# Patient Record
Sex: Female | Born: 1980 | Hispanic: No | Marital: Married | Smoking: Never smoker
Health system: Southern US, Community
[De-identification: ages and names within clinical notes are randomized; demographics above are authoritative.]

## PROBLEM LIST (undated history)

## (undated) DIAGNOSIS — J45909 Unspecified asthma, uncomplicated: Secondary | ICD-10-CM

## (undated) DIAGNOSIS — E119 Type 2 diabetes mellitus without complications: Secondary | ICD-10-CM

## (undated) DIAGNOSIS — I10 Essential (primary) hypertension: Secondary | ICD-10-CM

## (undated) DIAGNOSIS — I2699 Other pulmonary embolism without acute cor pulmonale: Secondary | ICD-10-CM

## (undated) DIAGNOSIS — K219 Gastro-esophageal reflux disease without esophagitis: Secondary | ICD-10-CM

## (undated) DIAGNOSIS — D649 Anemia, unspecified: Secondary | ICD-10-CM

## (undated) DIAGNOSIS — I509 Heart failure, unspecified: Secondary | ICD-10-CM

## (undated) DIAGNOSIS — E785 Hyperlipidemia, unspecified: Secondary | ICD-10-CM

## (undated) DIAGNOSIS — I639 Cerebral infarction, unspecified: Secondary | ICD-10-CM

## (undated) DIAGNOSIS — N189 Chronic kidney disease, unspecified: Secondary | ICD-10-CM

## (undated) HISTORY — DX: Chronic kidney disease, unspecified: N18.9

## (undated) HISTORY — PX: MOUTH SURGERY: SHX715

---

## 2000-02-06 ENCOUNTER — Other Ambulatory Visit: Admission: RE | Admit: 2000-02-06 | Discharge: 2000-02-06 | Payer: Self-pay | Admitting: Family Medicine

## 2005-06-24 ENCOUNTER — Other Ambulatory Visit: Admission: RE | Admit: 2005-06-24 | Discharge: 2005-06-24 | Payer: Self-pay | Admitting: Obstetrics and Gynecology

## 2007-09-21 ENCOUNTER — Encounter: Admission: RE | Admit: 2007-09-21 | Discharge: 2007-12-20 | Payer: Self-pay | Admitting: Family Medicine

## 2009-08-14 ENCOUNTER — Emergency Department (HOSPITAL_COMMUNITY): Admission: EM | Admit: 2009-08-14 | Discharge: 2009-08-14 | Payer: Self-pay | Admitting: Emergency Medicine

## 2011-01-01 LAB — POCT URINALYSIS DIP (DEVICE)
Nitrite: NEGATIVE
pH: 5.5 (ref 5.0–8.0)

## 2014-02-09 ENCOUNTER — Telehealth: Payer: Self-pay | Admitting: *Deleted

## 2014-02-09 NOTE — Telephone Encounter (Signed)
rx denied, patient has not been seen in over a year

## 2016-06-05 ENCOUNTER — Encounter (HOSPITAL_COMMUNITY): Payer: Self-pay | Admitting: Nurse Practitioner

## 2016-06-05 ENCOUNTER — Inpatient Hospital Stay (HOSPITAL_COMMUNITY)
Admission: EM | Admit: 2016-06-05 | Discharge: 2016-06-06 | DRG: 639 | Disposition: A | Discharge status: Home / Self Care | Payer: Managed Care, Other (non HMO) | Attending: Internal Medicine | Admitting: Internal Medicine

## 2016-06-05 ENCOUNTER — Observation Stay (HOSPITAL_COMMUNITY): Payer: Managed Care, Other (non HMO)

## 2016-06-05 DIAGNOSIS — E1165 Type 2 diabetes mellitus with hyperglycemia: Secondary | ICD-10-CM

## 2016-06-05 DIAGNOSIS — E876 Hypokalemia: Secondary | ICD-10-CM | POA: Diagnosis present

## 2016-06-05 DIAGNOSIS — Z79899 Other long term (current) drug therapy: Secondary | ICD-10-CM | POA: Diagnosis not present

## 2016-06-05 DIAGNOSIS — Z833 Family history of diabetes mellitus: Secondary | ICD-10-CM

## 2016-06-05 DIAGNOSIS — E1110 Type 2 diabetes mellitus with ketoacidosis without coma: Secondary | ICD-10-CM | POA: Diagnosis present

## 2016-06-05 DIAGNOSIS — Z794 Long term (current) use of insulin: Secondary | ICD-10-CM | POA: Diagnosis not present

## 2016-06-05 DIAGNOSIS — E1310 Other specified diabetes mellitus with ketoacidosis without coma: Secondary | ICD-10-CM | POA: Diagnosis not present

## 2016-06-05 DIAGNOSIS — Z888 Allergy status to other drugs, medicaments and biological substances status: Secondary | ICD-10-CM

## 2016-06-05 DIAGNOSIS — Z7982 Long term (current) use of aspirin: Secondary | ICD-10-CM

## 2016-06-05 DIAGNOSIS — K219 Gastro-esophageal reflux disease without esophagitis: Secondary | ICD-10-CM | POA: Diagnosis present

## 2016-06-05 DIAGNOSIS — R059 Cough, unspecified: Secondary | ICD-10-CM

## 2016-06-05 DIAGNOSIS — E785 Hyperlipidemia, unspecified: Secondary | ICD-10-CM | POA: Diagnosis present

## 2016-06-05 DIAGNOSIS — E1010 Type 1 diabetes mellitus with ketoacidosis without coma: Principal | ICD-10-CM | POA: Diagnosis present

## 2016-06-05 DIAGNOSIS — Z7984 Long term (current) use of oral hypoglycemic drugs: Secondary | ICD-10-CM | POA: Diagnosis not present

## 2016-06-05 DIAGNOSIS — R05 Cough: Secondary | ICD-10-CM

## 2016-06-05 DIAGNOSIS — R112 Nausea with vomiting, unspecified: Secondary | ICD-10-CM | POA: Diagnosis present

## 2016-06-05 HISTORY — DX: Type 2 diabetes mellitus without complications: E11.9

## 2016-06-05 HISTORY — DX: Hyperlipidemia, unspecified: E78.5

## 2016-06-05 HISTORY — DX: Gastro-esophageal reflux disease without esophagitis: K21.9

## 2016-06-05 LAB — BASIC METABOLIC PANEL
ANION GAP: 9 (ref 5–15)
BUN: 12 mg/dL (ref 6–20)
CALCIUM: 8.7 mg/dL — AB (ref 8.9–10.3)
CO2: 22 mmol/L (ref 22–32)
CREATININE: 0.61 mg/dL (ref 0.44–1.00)
Chloride: 104 mmol/L (ref 101–111)
Glucose, Bld: 271 mg/dL — ABNORMAL HIGH (ref 65–99)
Potassium: 4.1 mmol/L (ref 3.5–5.1)
Sodium: 135 mmol/L (ref 135–145)

## 2016-06-05 LAB — BLOOD GAS, VENOUS
ACID-BASE DEFICIT: 1.7 mmol/L (ref 0.0–2.0)
Bicarbonate: 23.5 mmol/L (ref 20.0–28.0)
O2 SAT: 68.4 %
PCO2 VEN: 43.7 mmHg — AB (ref 44.0–60.0)
PO2 VEN: 41.1 mmHg (ref 32.0–45.0)
Patient temperature: 98.6
pH, Ven: 7.35 (ref 7.250–7.430)

## 2016-06-05 LAB — CBC WITH DIFFERENTIAL/PLATELET
BASOS ABS: 0 10*3/uL (ref 0.0–0.1)
Basophils Relative: 0 %
Eosinophils Absolute: 0.1 10*3/uL (ref 0.0–0.7)
Eosinophils Relative: 1 %
HEMATOCRIT: 40.7 % (ref 36.0–46.0)
Hemoglobin: 13.5 g/dL (ref 12.0–15.0)
LYMPHS PCT: 18 %
Lymphs Abs: 2.3 10*3/uL (ref 0.7–4.0)
MCH: 27.7 pg (ref 26.0–34.0)
MCHC: 33.2 g/dL (ref 30.0–36.0)
MCV: 83.4 fL (ref 78.0–100.0)
MONO ABS: 0.5 10*3/uL (ref 0.1–1.0)
Monocytes Relative: 4 %
NEUTROS ABS: 10.2 10*3/uL — AB (ref 1.7–7.7)
Neutrophils Relative %: 77 %
Platelets: 331 10*3/uL (ref 150–400)
RBC: 4.88 MIL/uL (ref 3.87–5.11)
RDW: 16.5 % — ABNORMAL HIGH (ref 11.5–15.5)
WBC: 13.1 10*3/uL — AB (ref 4.0–10.5)

## 2016-06-05 LAB — COMPREHENSIVE METABOLIC PANEL
ALBUMIN: 4.1 g/dL (ref 3.5–5.0)
ALT: 16 U/L (ref 14–54)
AST: 14 U/L — AB (ref 15–41)
Alkaline Phosphatase: 137 U/L — ABNORMAL HIGH (ref 38–126)
Anion gap: 16 — ABNORMAL HIGH (ref 5–15)
BUN: 14 mg/dL (ref 6–20)
CHLORIDE: 92 mmol/L — AB (ref 101–111)
CO2: 22 mmol/L (ref 22–32)
Calcium: 9.5 mg/dL (ref 8.9–10.3)
Creatinine, Ser: 0.89 mg/dL (ref 0.44–1.00)
GFR calc Af Amer: >60 mL/min (ref >60)
Glucose, Bld: 628 mg/dL (ref 65–99)
POTASSIUM: 4.3 mmol/L (ref 3.5–5.1)
SODIUM: 130 mmol/L — AB (ref 135–145)
Total Bilirubin: 1.1 mg/dL (ref 0.3–1.2)
Total Protein: 9.3 g/dL — ABNORMAL HIGH (ref 6.5–8.1)

## 2016-06-05 LAB — URINE MICROSCOPIC-ADD ON: RBC / HPF: NONE SEEN RBC/hpf (ref 0–5)

## 2016-06-05 LAB — GLUCOSE, CAPILLARY
GLUCOSE-CAPILLARY: 245 mg/dL — AB (ref 65–99)
GLUCOSE-CAPILLARY: 192 mg/dL — AB (ref 65–99)
GLUCOSE-CAPILLARY: 190 mg/dL — AB (ref 65–99)
Glucose-Capillary: 251 mg/dL — ABNORMAL HIGH (ref 65–99)
Glucose-Capillary: 226 mg/dL — ABNORMAL HIGH (ref 65–99)
Glucose-Capillary: 249 mg/dL — ABNORMAL HIGH (ref 65–99)

## 2016-06-05 LAB — URINALYSIS, ROUTINE W REFLEX MICROSCOPIC
Bilirubin Urine: NEGATIVE
Hgb urine dipstick: NEGATIVE
KETONES UR: 40 mg/dL — AB
LEUKOCYTES UA: NEGATIVE
NITRITE: NEGATIVE
PROTEIN: NEGATIVE mg/dL
Specific Gravity, Urine: 1.041 — ABNORMAL HIGH (ref 1.005–1.030)
pH: 5.5 (ref 5.0–8.0)

## 2016-06-05 LAB — CBG MONITORING, ED
GLUCOSE-CAPILLARY: 462 mg/dL — AB (ref 65–99)
GLUCOSE-CAPILLARY: 349 mg/dL — AB (ref 65–99)
GLUCOSE-CAPILLARY: 332 mg/dL — AB (ref 65–99)
Glucose-Capillary: 565 mg/dL (ref 65–99)

## 2016-06-05 LAB — LIPASE, BLOOD: LIPASE: 23 U/L (ref 11–51)

## 2016-06-05 LAB — MRSA PCR SCREENING: MRSA by PCR: NEGATIVE

## 2016-06-05 LAB — POC URINE PREG, ED: PREG TEST UR: NEGATIVE

## 2016-06-05 LAB — I-STAT CHEM 8, ED
BUN: 11 mg/dL (ref 6–20)
CALCIUM ION: 1.14 mmol/L — AB (ref 1.15–1.40)
Chloride: 99 mmol/L — ABNORMAL LOW (ref 101–111)
Creatinine, Ser: 0.6 mg/dL (ref 0.44–1.00)
Glucose, Bld: 385 mg/dL — ABNORMAL HIGH (ref 65–99)
HCT: 40 % (ref 36.0–46.0)
Hemoglobin: 13.6 g/dL (ref 12.0–15.0)
Potassium: 4.1 mmol/L (ref 3.5–5.1)
SODIUM: 140 mmol/L (ref 135–145)
TCO2: 25 mmol/L (ref 0–100)

## 2016-06-05 IMAGING — CR DG CHEST 2V
2 series · 2 of 2 positions shown · non-contrast
Comparison: None.

CLINICAL DATA: Cough

EXAM:
CHEST  2 VIEW

[w chest pa]
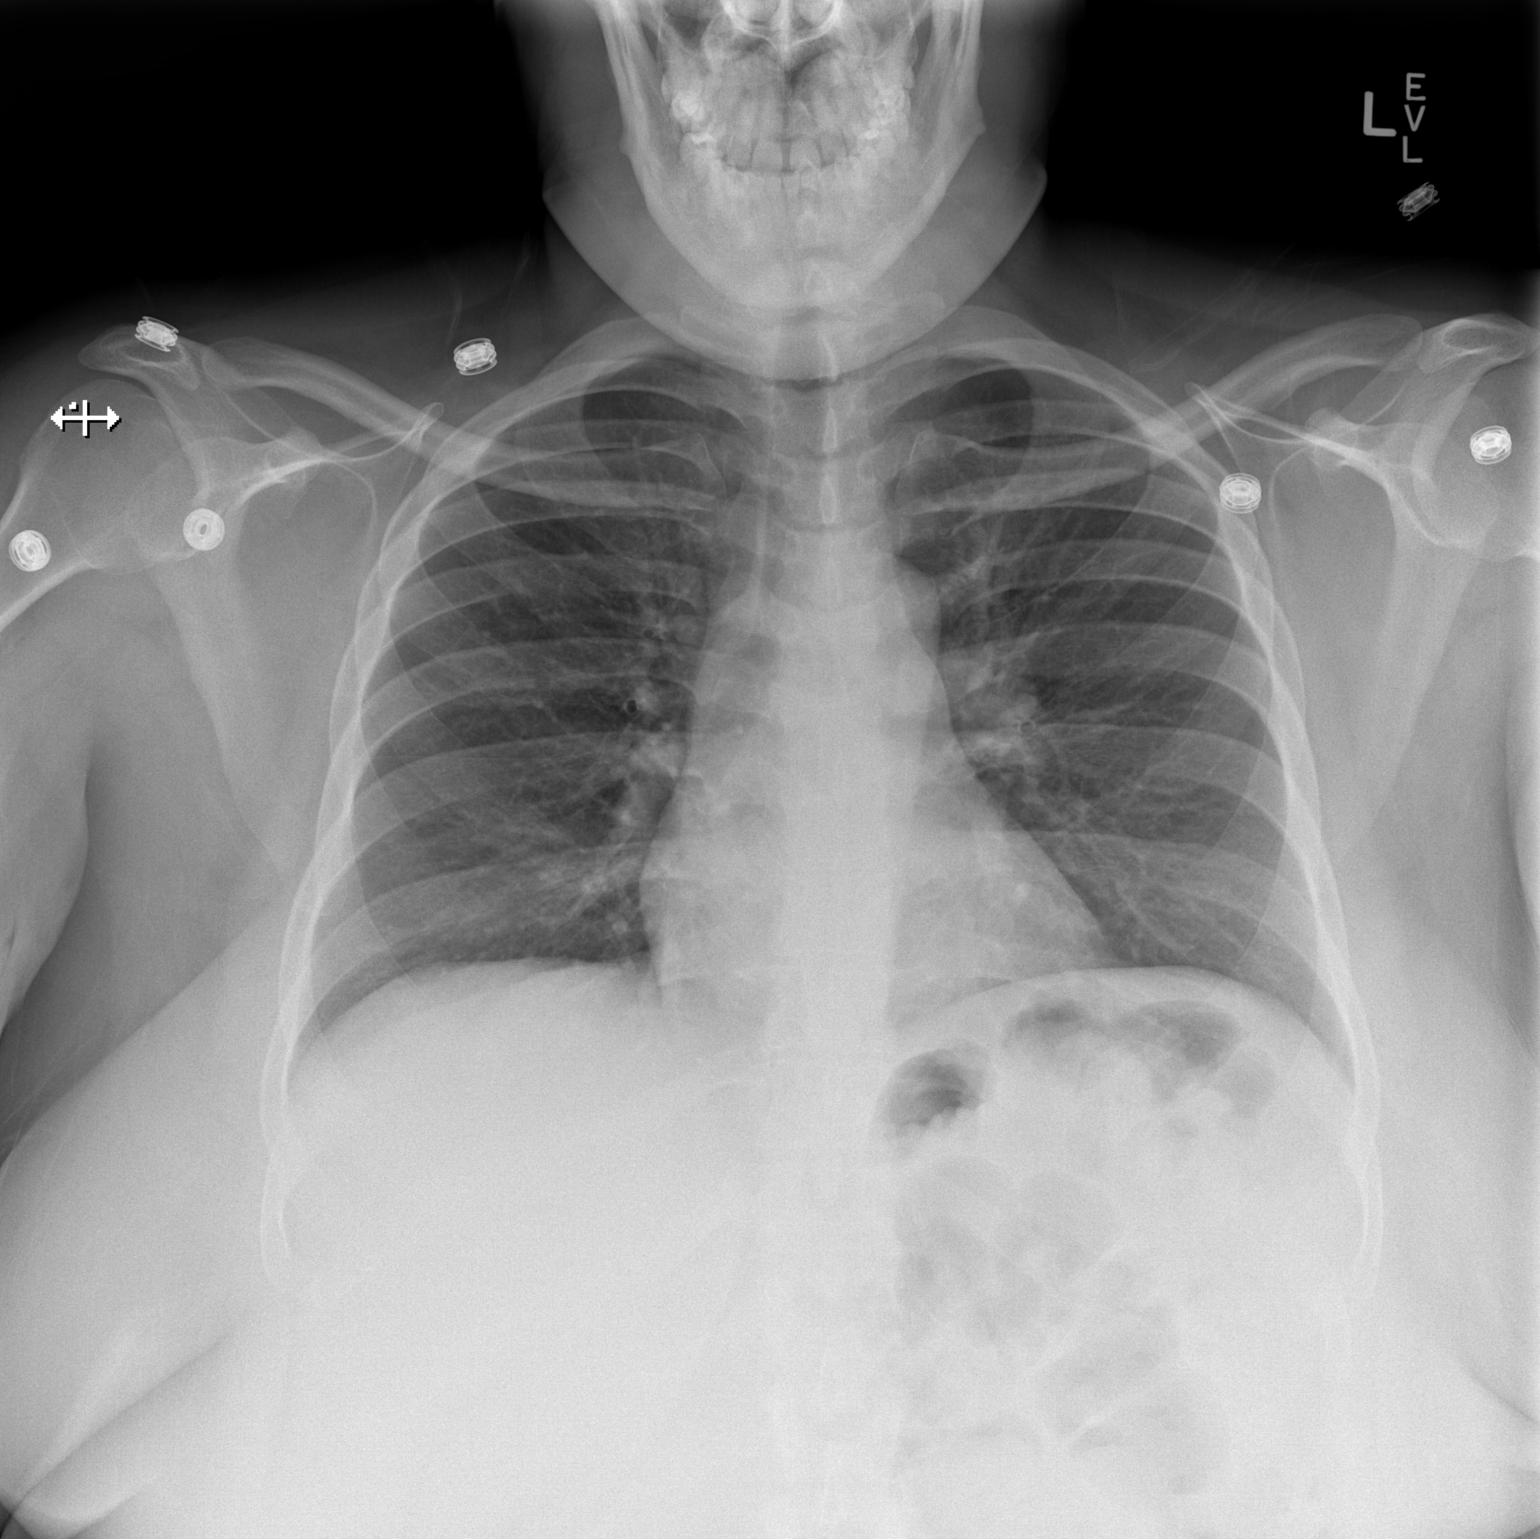

[w chest lat]
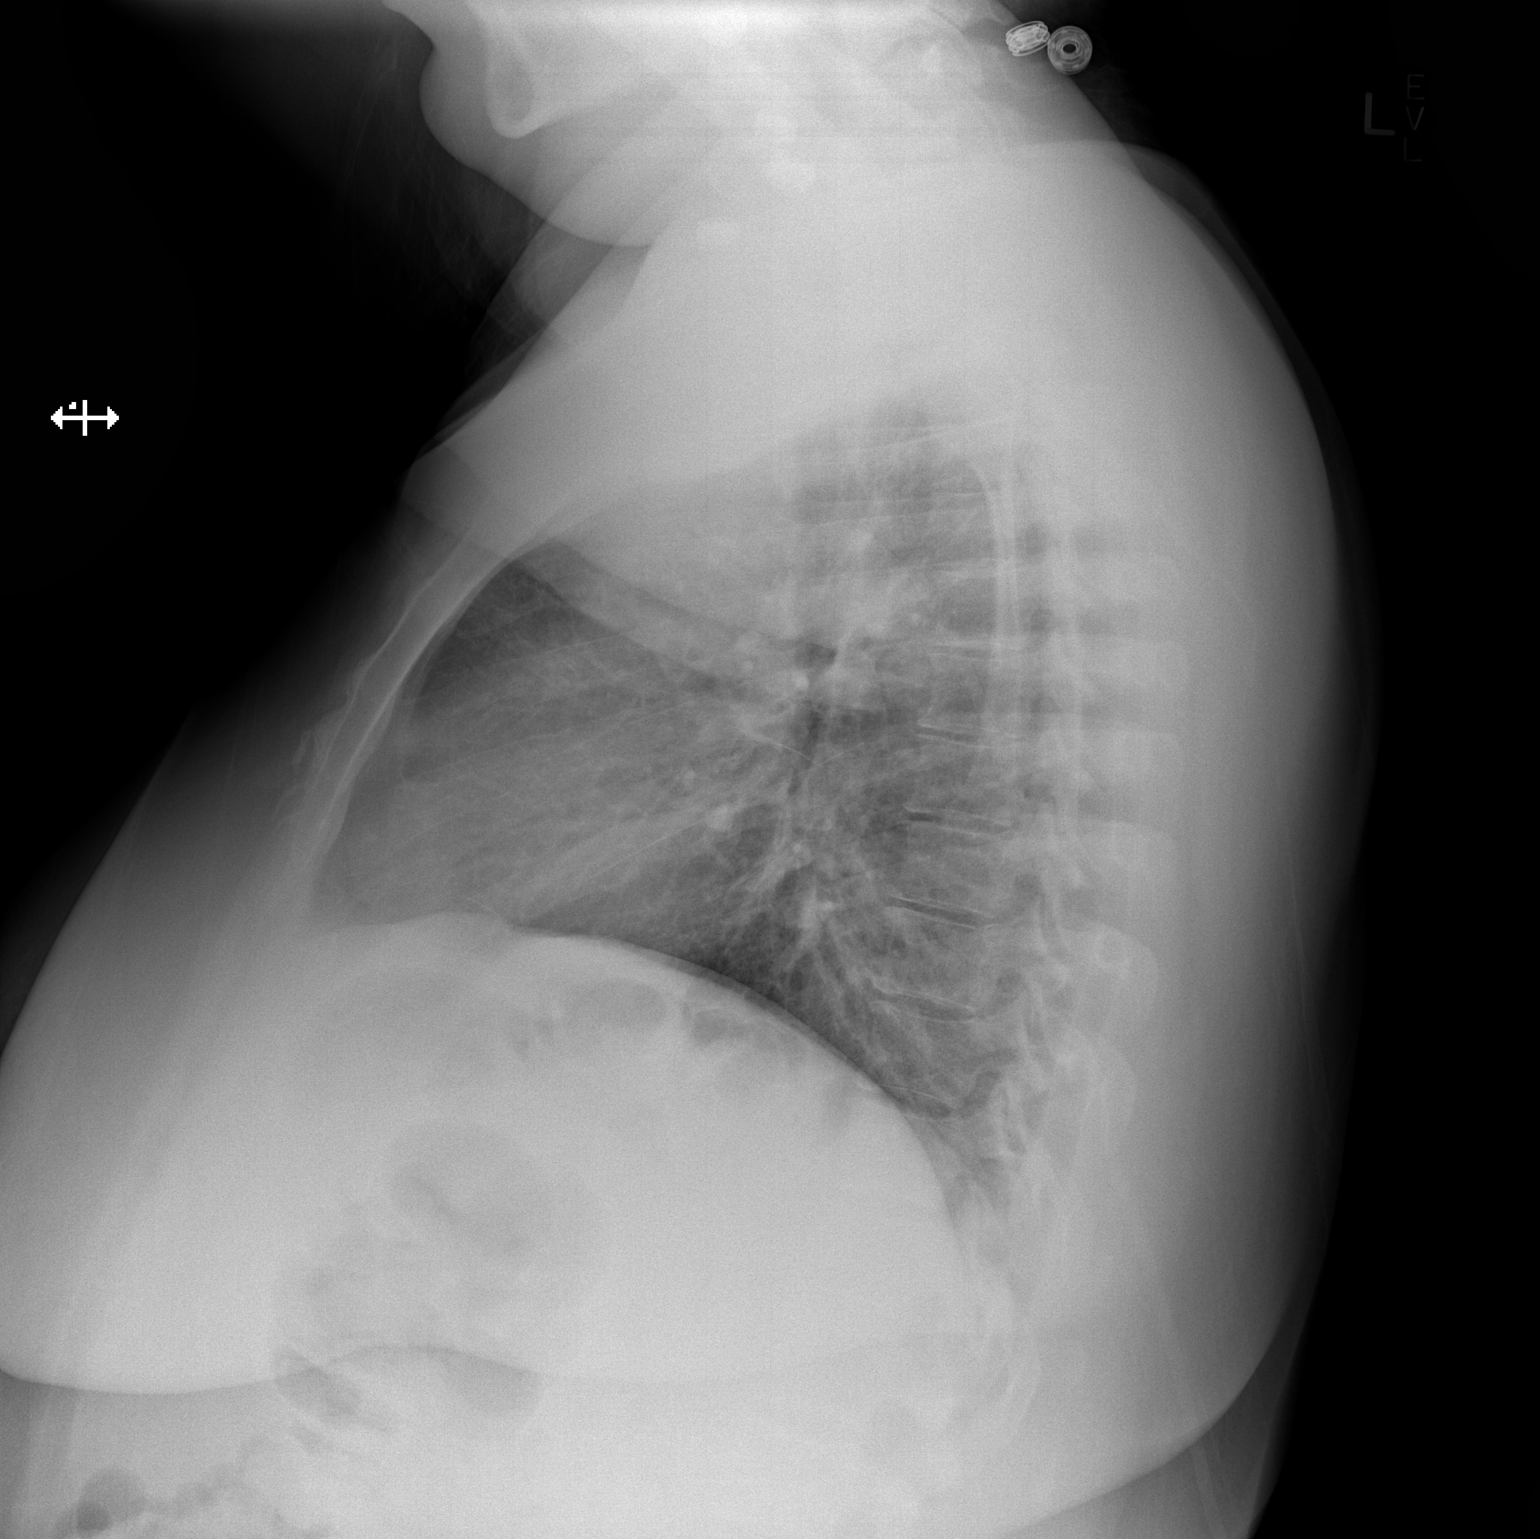

[2 of 2 positions shown; findings below may reference images not displayed]

FINDINGS: The heart size and mediastinal contours are within normal limits.
Both lungs are clear. The visualized skeletal structures are
unremarkable.
IMPRESSION: No active cardiopulmonary disease.

## 2016-06-05 MED ORDER — INSULIN ASPART 100 UNIT/ML ~~LOC~~ SOLN
10.0000 [IU] | Freq: Once | SUBCUTANEOUS | Status: AC
  Administered 2016-06-05: 10 [IU] via INTRAVENOUS
  Filled 2016-06-05: qty 1

## 2016-06-05 MED ORDER — DEXTROSE-NACL 5-0.45 % IV SOLN: INTRAVENOUS | Status: DC

## 2016-06-05 MED ORDER — POTASSIUM CHLORIDE 10 MEQ/100ML IV SOLN
10.0000 meq | INTRAVENOUS | Status: AC
  Administered 2016-06-05 (×3): 10 meq via INTRAVENOUS
  Filled 2016-06-05 (×2): qty 100

## 2016-06-05 MED ORDER — SODIUM CHLORIDE 0.9% FLUSH
3.0000 mL | Freq: Two times a day (BID) | INTRAVENOUS | Status: DC
  Administered 2016-06-05 – 2016-06-06 (×2): 3 mL via INTRAVENOUS

## 2016-06-05 MED ORDER — SODIUM CHLORIDE 0.9 % IV BOLUS (SEPSIS)
1000.0000 mL | Freq: Once | INTRAVENOUS | Status: AC
  Administered 2016-06-05: 1000 mL via INTRAVENOUS

## 2016-06-05 MED ORDER — SODIUM CHLORIDE 0.9 % IV SOLN: INTRAVENOUS | Status: DC

## 2016-06-05 MED ORDER — SIMVASTATIN 40 MG PO TABS
40.0000 mg | ORAL_TABLET | Freq: Every day | ORAL | Status: DC
  Administered 2016-06-05: 40 mg via ORAL
  Filled 2016-06-05: qty 1

## 2016-06-05 MED ORDER — ONDANSETRON HCL 4 MG/2ML IJ SOLN
4.0000 mg | Freq: Once | INTRAMUSCULAR | Status: AC
  Administered 2016-06-05: 4 mg via INTRAVENOUS
  Filled 2016-06-05: qty 2

## 2016-06-05 MED ORDER — ENOXAPARIN SODIUM 40 MG/0.4ML ~~LOC~~ SOLN
40.0000 mg | SUBCUTANEOUS | Status: DC
  Administered 2016-06-05: 40 mg via SUBCUTANEOUS
  Filled 2016-06-05: qty 0.4

## 2016-06-05 MED ORDER — SODIUM CHLORIDE 0.9 % IV SOLN
INTRAVENOUS | Status: DC
  Administered 2016-06-05: 2.7 [IU]/h via INTRAVENOUS
  Filled 2016-06-05: qty 2.5

## 2016-06-05 MED ORDER — SODIUM CHLORIDE 0.9 % IV SOLN
INTRAVENOUS | Status: AC
  Administered 2016-06-05: 17:00:00 via INTRAVENOUS

## 2016-06-05 MED ORDER — DEXTROSE-NACL 5-0.45 % IV SOLN
INTRAVENOUS | Status: DC
  Administered 2016-06-05: 17:00:00 via INTRAVENOUS

## 2016-06-05 NOTE — ED Notes (Signed)
Report given to ICU RN

## 2016-06-05 NOTE — H&P (Signed)
History and Physical    Sherri Dixon [id number] DOB: May 27, 1981 DOA: 06/05/2016  PCP: No primary care provider on file.  Outpatient Specialists: Dr. Forde Dandy, endocrinology (about to establish care,seen by Dr. Chalmers Cater in the past) Patient coming from: home  Chief Complaint: Elevated sugars  HPI: Sherri Dixon is a 35 y.o. female with medical history significant of type 2 diabetes mellitus, hyperlipidemia, reflux, presents to the emergency room with chief complaint of abdominal pain, nausea vomiting as well as elevated CBGs. Patient has a history of insulin-dependent diabetes mellitus, she is currently in the process of changing endocrinologists. She used to be on NPH isophane and regular insulin, however she developed mouth sores which were believed to be related to her insulin. She was instructed to stop her insulin about couple weeks ago, and had a period for about 6-7 days where she didn't get any insulin. About 3 days ago, she was given Toujeo as a sample to take. Ever since she came off of her insulin 2 weeks ago, patient has been experiencing abdominal pain, nausea or vomiting. She denies any fevers however endorses chills. She has no dysuria. Denies any chest pain or palpitations. She is complaining of "chest congestion" and a dry cough.  ED Course: In the ED, she was found to have elevated CBG in the 600s, she has a gap of 16, however bicarbonate was normal at 22. She was started on insulin infusion and [facility] was asked to admit.  Review of Systems: As per HPI otherwise 10 point review of systems negative.   Past Medical History:  Diagnosis Date  . Diabetes mellitus without complication ([place])   . Dyslipidemia   . GERD (gastroesophageal reflux disease)     History reviewed. No pertinent surgical history.   reports that she has never smoked. She has never used smokeless tobacco. She reports that she does not drink alcohol or use drugs.  Allergies  Allergen Reactions  . Insulin Nph  Isophane & Regular Rash    Pt has this reaction to humalog & lantus when taken together     Family History  Problem Relation Age of Onset  . High blood pressure Mother   . Diabetes Mother   . Arthritis Mother   . Hyperlipidemia Mother   . High blood pressure Father   . Hyperlipidemia Father   . High blood pressure Sister   . Diabetes Brother   . Hyperlipidemia Brother   . High blood pressure Brother     Prior to Admission medications   Medication Sig Start Date End Date Taking? Authorizing Provider  aspirin 325 MG EC tablet Take 325 mg by mouth daily.   Yes Historical Provider, MD  Insulin Glargine (TOUJEO SOLOSTAR) 300 UNIT/ML SOPN Inject 50 Units into the skin daily.   Yes Historical Provider, MD  metFORMIN (GLUCOPHAGE) 500 MG tablet Take 500 mg by mouth 2 (two) times daily.   Yes Historical Provider, MD  Multiple Vitamins-Minerals (CENTRUM ADULTS PO) Take 1 tablet by mouth daily.   Yes Historical Provider, MD  naproxen sodium (ANAPROX) 220 MG tablet Take 400 mg by mouth every 12 (twelve) hours as needed (pain).   Yes Historical Provider, MD  simvastatin (ZOCOR) 40 MG tablet Take 40 mg by mouth at bedtime. 03/28/16  Yes Historical Provider, MD    Physical Exam: Vitals:   06/05/16 0902 06/05/16 0915 06/05/16 1154 06/05/16 1357  BP: 140/82  118/73 126/81  Pulse: 101  89 87  Resp: 17  18 18   Temp: 98.8  F (37.1 C)  98.7 F (37.1 C) 98.8 F (37.1 C)  TempSrc: Oral  Oral Oral  SpO2: 100%  100% 100%  Weight:  (!) 140.6 kg (310 lb)    Height:  5\' 6"  (1.676 m)        Constitutional: NAD, calm, comfortable Vitals:   06/05/16 0902 06/05/16 0915 06/05/16 1154 06/05/16 1357  BP: 140/82  118/73 126/81  Pulse: 101  89 87  Resp: 17  18 18   Temp: 98.8 F (37.1 C)  98.7 F (37.1 C) 98.8 F (37.1 C)  TempSrc: Oral  Oral Oral  SpO2: 100%  100% 100%  Weight:  (!) 140.6 kg (310 lb)    Height:  5\' 6"  (1.676 m)     Eyes: PERRL, lids and conjunctivae normal ENMT: Mucous  membranes are moist.  Respiratory: clear to auscultation bilaterally, no wheezing, no crackles. Normal respiratory effort. No accessory muscle use.  Cardiovascular: Regular rate and rhythm, no murmurs / rubs / gallops. No extremity edema. 2+ pedal pulses.  Abdomen: no tenderness, no masses palpated. Bowel sounds positive.  Musculoskeletal: no clubbing / cyanosis. Normal muscle tone.  Skin: Eczematous/discolor related lesions bilateral palms, bilateral lower extremities  No induration Neurologic: CN 2-12 grossly intact. Strength 5/5 in all 4.  Psychiatric: Normal judgment and insight. Alert and oriented x 3. Normal mood.   Labs on Admission: I have personally reviewed following labs and imaging studies  CBC:  Recent Labs Lab 06/05/16 1025 06/05/16 1417  WBC 13.1*  --   NEUTROABS 10.2*  --   HGB 13.5 13.6  HCT 40.7 40.0  MCV 83.4  --   PLT 331  --    Basic Metabolic Panel:  Recent Labs Lab 06/05/16 1025 06/05/16 1417  NA 130* 140  K 4.3 4.1  CL 92* 99*  CO2 22  --   GLUCOSE 628* 385*  BUN 14 11  CREATININE 0.89 0.60  CALCIUM 9.5  --    GFR: Estimated Creatinine Clearance: 139.5 mL/min (by C-G formula based on SCr of 0.8 mg/dL). Liver Function Tests:  Recent Labs Lab 06/05/16 1025  AST 14*  ALT 16  ALKPHOS 137*  BILITOT 1.1  PROT 9.3*  ALBUMIN 4.1    Recent Labs Lab 06/05/16 1025  LIPASE 23   No results for input(s): AMMONIA in the last 168 hours. Coagulation Profile: No results for input(s): INR, PROTIME in the last 168 hours. Cardiac Enzymes: No results for input(s): CKTOTAL, CKMB, CKMBINDEX, TROPONINI in the last 168 hours. BNP (last 3 results) No results for input(s): PROBNP in the last 8760 hours. HbA1C: No results for input(s): HGBA1C in the last 72 hours. CBG:  Recent Labs Lab 06/05/16 0951 06/05/16 1222 06/05/16 1418 06/05/16 1555  GLUCAP 565* 462* 349* 332*   Lipid Profile: No results for input(s): CHOL, HDL, LDLCALC, TRIG, CHOLHDL,  LDLDIRECT in the last 72 hours. Thyroid Function Tests: No results for input(s): TSH, T4TOTAL, FREET4, T3FREE, THYROIDAB in the last 72 hours. Anemia Panel: No results for input(s): VITAMINB12, FOLATE, FERRITIN, TIBC, IRON, RETICCTPCT in the last 72 hours. Urine analysis:    Component Value Date/Time   COLORURINE YELLOW 06/05/2016 0941   APPEARANCEUR CLEAR 06/05/2016 0941   LABSPEC 1.041 (H) 06/05/2016 0941   PHURINE 5.5 06/05/2016 0941   GLUCOSEU >1000 (A) 06/05/2016 0941   HGBUR NEGATIVE 06/05/2016 0941   BILIRUBINUR NEGATIVE 06/05/2016 0941   KETONESUR 40 (A) 06/05/2016 0941   PROTEINUR NEGATIVE 06/05/2016 0941   UROBILINOGEN 1.0 08/14/2009 0850  NITRITE NEGATIVE 06/05/2016 0941   LEUKOCYTESUR NEGATIVE 06/05/2016 0941   Sepsis Labs: @LABRCNTIP (procalcitonin:4,lacticidven:4) )No results found for this or any previous visit (from the past 240 hour(s)).   Radiological Exams on Admission: No results found.   Assessment/Plan Active Problems:   GERD (gastroesophageal reflux disease)   DKA (diabetic ketoacidoses) (HCC)   Elevated CBGs / in the setting of insulin-dependent diabetes mellitus - Patient's bicarbonate is normal, pH seems to be normal however she has an anion gap. Doesn't appear to be classic regular DKA but more of a hyperglycemia picture, probably if she wouldn't have been on any insulins at all she would be more acidotic. I will treat her as DKA though to be on the safe side, and admit to step down, place on insulin drip, keep NPO for now - Obtain a chest x-ray given her reported upper respiratory symptoms   Hyperlipidemia - Continue home medications   DVT prophylaxis: Loveox  Code Status: Full  Family Communication: significant otherbedside Disposition Plan: Admit to step down  Consults called: None   Admission status: Inpatient     Marzetta Board, MD [facility] Pager [phone]  If 7PM-7AM, please contact  night-coverage [email or web address] Password [email or web address]  06/05/2016, 4:14 PM

## 2016-06-05 NOTE — ED Provider Notes (Signed)
[facility] DEPT Provider Note   CSN: [id number] Arrival date & time: 06/05/16  0846     History   Chief Complaint Chief Complaint  Patient presents with  . Emesis    HPI Sherri Dixon is a 35 y.o. female.  HPI Patient presents with concern of abdominal pain, nausea, vomiting. Illness has been present for about 2 weeks, worse over the past few days per Symptoms are more pronounced in the morning, improve over the day. Pain is upper abdominal, nonradiating, sore, crampy. There is continuous associated anorexia or No diarrhea. No chest pain, no dyspnea. Patient has a notable history of insulin-dependent diabetes, recently had changes in medication, including new oral antihyperglycemic which seemed to increase her symptoms.   Past Medical History:  Diagnosis Date  . Diabetes mellitus without complication ([place])   . Dyslipidemia   . GERD (gastroesophageal reflux disease)     Patient Active Problem List   Diagnosis Date Noted  . GERD (gastroesophageal reflux disease)     History reviewed. No pertinent surgical history.  OB History    No data available       Home Medications    Prior to Admission medications   Medication Sig Start Date End Date Taking? Authorizing Provider  aspirin 325 MG EC tablet Take 325 mg by mouth daily.   Yes Historical Provider, MD  Insulin Glargine (TOUJEO SOLOSTAR) 300 UNIT/ML SOPN Inject 50 Units into the skin daily.   Yes Historical Provider, MD  metFORMIN (GLUCOPHAGE) 500 MG tablet Take 500 mg by mouth 2 (two) times daily.   Yes Historical Provider, MD  Multiple Vitamins-Minerals (CENTRUM ADULTS PO) Take 1 tablet by mouth daily.   Yes Historical Provider, MD  naproxen sodium (ANAPROX) 220 MG tablet Take 400 mg by mouth every 12 (twelve) hours as needed (pain).   Yes Historical Provider, MD  simvastatin (ZOCOR) 40 MG tablet Take 40 mg by mouth at bedtime. 03/28/16  Yes Historical Provider, MD    Family History Family History    Problem Relation Age of Onset  . Family history unknown: Yes    Social History Social History  Substance Use Topics  . Smoking status: Never Smoker  . Smokeless tobacco: Never Used  . Alcohol use No     Allergies   Insulin nph isophane & regular   Review of Systems Review of Systems  Constitutional:       Per HPI, otherwise negative  HENT:       Per HPI, otherwise negative  Respiratory:       Per HPI, otherwise negative  Cardiovascular:       Per HPI, otherwise negative  Gastrointestinal: Positive for abdominal pain, nausea and vomiting.  Endocrine:       Negative aside from HPI  Genitourinary:       Neg aside from HPI   Musculoskeletal:       Per HPI, otherwise negative  Skin: Negative.   Neurological: Negative for syncope.     Physical Exam Updated Vital Signs BP 140/82 (BP Location: Right Arm)   Pulse 101   Temp 98.8 F (37.1 C) (Oral)   Resp 17   Ht 5\' 6"  (1.676 m)   Wt (!) 310 lb (140.6 kg)   LMP 05/18/2016   SpO2 100%   BMI 50.04 kg/m   Physical Exam  Constitutional: She is oriented to person, place, and time. She appears well-developed and well-nourished. No distress.  Obese young female sitting upright speaking clearly  HENT:  Head:  Normocephalic and atraumatic.  Eyes: Conjunctivae and EOM are normal.  Cardiovascular: Normal rate and regular rhythm.   Pulmonary/Chest: Effort normal and breath sounds normal. No stridor. No respiratory distress.  Abdominal: She exhibits no distension.  No appreciable abdominal pain anywhere  Musculoskeletal: She exhibits no edema.  Neurological: She is alert and oriented to person, place, and time. No cranial nerve deficit.  Skin: Skin is warm and dry.  Psychiatric: She has a normal mood and affect.  Nursing note and vitals reviewed.    ED Treatments / Results  Labs (all labs ordered are listed, but only abnormal results are displayed) Labs Reviewed  URINALYSIS, ROUTINE W REFLEX MICROSCOPIC (NOT AT  [facility]) - Abnormal; Notable for the following:       Result Value   Specific Gravity, Urine 1.041 (*)    Glucose, UA >1000 (*)    Ketones, ur 40 (*)    All other components within normal limits  COMPREHENSIVE METABOLIC PANEL - Abnormal; Notable for the following:    Sodium 130 (*)    Chloride 92 (*)    Glucose, Bld 628 (*)    Total Protein 9.3 (*)    AST 14 (*)    Alkaline Phosphatase 137 (*)    Anion gap 16 (*)    All other components within normal limits  CBC WITH DIFFERENTIAL/PLATELET - Abnormal; Notable for the following:    WBC 13.1 (*)    RDW 16.5 (*)    Neutro Abs 10.2 (*)    All other components within normal limits  BLOOD GAS, VENOUS - Abnormal; Notable for the following:    pCO2, Ven 43.7 (*)    All other components within normal limits  URINE MICROSCOPIC-ADD ON - Abnormal; Notable for the following:    Squamous Epithelial / LPF 0-5 (*)    Bacteria, UA RARE (*)    All other components within normal limits  CBG MONITORING, ED - Abnormal; Notable for the following:    Glucose-Capillary 565 (*)    All other components within normal limits  LIPASE, BLOOD  POC URINE PREG, ED    Initial blood glucose 565. Patient received empiric fluids.  Basic chemistry panel notable for glucose greater than 600, anion gap, but no acidosis. Patient continues to receive IV fluids, will receive insulin, IV. Patient denies allergy to insulin.   Procedures Procedures (including critical care time)  Medications Ordered in ED Medications  insulin aspart (novoLOG) injection 10 Units (not administered)  sodium chloride 0.9 % bolus 1,000 mL (1,000 mLs Intravenous New Bag/Given 06/05/16 1012)  ondansetron (ZOFRAN) injection 4 mg (4 mg Intravenous Given 06/05/16 1008)     Initial Impression / Assessment and Plan / ED Course  I have reviewed the triage vital signs and the nursing notes.  Pertinent labs & imaging results that were available during my care of the patient were reviewed by me  and considered in my medical decision making (see chart for details).  Clinical Course    3:33 PM Following 2 L fluid resuscitation, 2 boluses of insulin the patient continues to have hyperglycemia, 385, and persistent anion gap of 30. I discussed all findings can with patient and her husband, patient will be started on insulin drip, admitted for further evaluation, management.  Final Clinical Impressions(s) / ED Diagnoses   Patient presents with nausea, vomiting, belly pain. Here the patient is initially found to have critically abnormal glucose level, subsequent found to have ketonuria, anion gap, both concerning for hyperosmolar state.  Patient and initiation of fluid resuscitation, multiple insulin boluses, had some reduction in glucose, but had persistent anion gap, mild improvement in symptoms. Patient was then transitioned to insulin drip, admitted for further evaluation, management.  CRITICAL CARE Performed by: Carmin Muskrat Total critical care time: 40 minutes Critical care time was exclusive of separately billable procedures and treating other patients. Critical care was necessary to treat or prevent imminent or life-threatening deterioration. Critical care was time spent personally by me on the following activities: development of treatment plan with patient and/or surrogate as well as nursing, discussions with consultants, evaluation of patient's response to treatment, examination of patient, obtaining history from patient or surrogate, ordering and performing treatments and interventions, ordering and review of laboratory studies, ordering and review of radiographic studies, pulse oximetry and re-evaluation of patient's condition.    Carmin Muskrat, MD 06/05/16 1534

## 2016-06-06 DIAGNOSIS — Z794 Long term (current) use of insulin: Secondary | ICD-10-CM

## 2016-06-06 DIAGNOSIS — K219 Gastro-esophageal reflux disease without esophagitis: Secondary | ICD-10-CM

## 2016-06-06 DIAGNOSIS — E1010 Type 1 diabetes mellitus with ketoacidosis without coma: Principal | ICD-10-CM

## 2016-06-06 DIAGNOSIS — E0865 Diabetes mellitus due to underlying condition with hyperglycemia: Secondary | ICD-10-CM

## 2016-06-06 DIAGNOSIS — E876 Hypokalemia: Secondary | ICD-10-CM

## 2016-06-06 DIAGNOSIS — E1165 Type 2 diabetes mellitus with hyperglycemia: Secondary | ICD-10-CM

## 2016-06-06 LAB — GLUCOSE, CAPILLARY
GLUCOSE-CAPILLARY: 168 mg/dL — AB (ref 65–99)
GLUCOSE-CAPILLARY: 334 mg/dL — AB (ref 65–99)
Glucose-Capillary: 103 mg/dL — ABNORMAL HIGH (ref 65–99)
Glucose-Capillary: 109 mg/dL — ABNORMAL HIGH (ref 65–99)
Glucose-Capillary: 141 mg/dL — ABNORMAL HIGH (ref 65–99)

## 2016-06-06 LAB — BASIC METABOLIC PANEL
Anion gap: 7 (ref 5–15)
Anion gap: 9 (ref 5–15)
Anion gap: 9 (ref 5–15)
BUN: 12 mg/dL (ref 6–20)
BUN: 13 mg/dL (ref 6–20)
BUN: 13 mg/dL (ref 6–20)
CALCIUM: 8.8 mg/dL — AB (ref 8.9–10.3)
CO2: 22 mmol/L (ref 22–32)
CO2: 23 mmol/L (ref 22–32)
CO2: 23 mmol/L (ref 22–32)
CREATININE: 0.57 mg/dL (ref 0.44–1.00)
CREATININE: 0.66 mg/dL (ref 0.44–1.00)
CREATININE: 0.68 mg/dL (ref 0.44–1.00)
Calcium: 8.5 mg/dL — ABNORMAL LOW (ref 8.9–10.3)
Calcium: 8.7 mg/dL — ABNORMAL LOW (ref 8.9–10.3)
Chloride: 102 mmol/L (ref 101–111)
Chloride: 104 mmol/L (ref 101–111)
Chloride: 105 mmol/L (ref 101–111)
GFR calc Af Amer: >60 mL/min (ref >60)
GFR calc Af Amer: >60 mL/min (ref >60)
GFR calc non Af Amer: >60 mL/min (ref >60)
GLUCOSE: 194 mg/dL — AB (ref 65–99)
Glucose, Bld: 148 mg/dL — ABNORMAL HIGH (ref 65–99)
Glucose, Bld: 319 mg/dL — ABNORMAL HIGH (ref 65–99)
Potassium: 3.1 mmol/L — ABNORMAL LOW (ref 3.5–5.1)
Potassium: 3.6 mmol/L (ref 3.5–5.1)
Potassium: 4.4 mmol/L (ref 3.5–5.1)
SODIUM: 135 mmol/L (ref 135–145)
SODIUM: 135 mmol/L (ref 135–145)
SODIUM: 134 mmol/L — AB (ref 135–145)

## 2016-06-06 LAB — HEMOGLOBIN A1C
HEMOGLOBIN A1C: 14 % — AB (ref 4.8–5.6)
Mean Plasma Glucose: 355 mg/dL

## 2016-06-06 MED ORDER — INSULIN ASPART 100 UNIT/ML ~~LOC~~ SOLN
6.0000 [IU] | Freq: Three times a day (TID) | SUBCUTANEOUS | Status: DC
  Administered 2016-06-06: 6 [IU] via SUBCUTANEOUS

## 2016-06-06 MED ORDER — POTASSIUM CHLORIDE CRYS ER 20 MEQ PO TBCR
40.0000 meq | EXTENDED_RELEASE_TABLET | Freq: Once | ORAL | Status: AC
  Administered 2016-06-06: 40 meq via ORAL
  Filled 2016-06-06: qty 2

## 2016-06-06 MED ORDER — INSULIN ASPART 100 UNIT/ML ~~LOC~~ SOLN
0.0000 [IU] | Freq: Three times a day (TID) | SUBCUTANEOUS | Status: DC
  Administered 2016-06-06: 15 [IU] via SUBCUTANEOUS

## 2016-06-06 MED ORDER — INSULIN GLARGINE 100 UNIT/ML ~~LOC~~ SOLN: 15.0000 [IU] | Freq: Every day | SUBCUTANEOUS | Status: DC

## 2016-06-06 MED ORDER — INSULIN DETEMIR 100 UNIT/ML ~~LOC~~ SOLN
15.0000 [IU] | Freq: Every day | SUBCUTANEOUS | Status: DC
  Administered 2016-06-06: 15 [IU] via SUBCUTANEOUS
  Filled 2016-06-06: qty 0.15

## 2016-06-06 MED ORDER — LIVING WELL WITH DIABETES BOOK
Freq: Once | Status: DC
  Filled 2016-06-06: qty 1

## 2016-06-06 MED ORDER — GLIMEPIRIDE 2 MG PO TABS: 2.0000 mg | ORAL_TABLET | Freq: Every day | ORAL | 0 refills | Status: DC

## 2016-06-06 MED ORDER — ALCOHOL SWABS 70 % PADS: 1.0000 "application " | MEDICATED_PAD | Freq: Three times a day (TID) | 3 refills | Status: DC

## 2016-06-06 MED ORDER — GLUCOSE BLOOD VI STRP: ORAL_STRIP | 3 refills | Status: DC

## 2016-06-06 MED ORDER — INSULIN LISPRO 100 UNIT/ML ~~LOC~~ SOLN: 6.0000 [IU] | Freq: Three times a day (TID) | SUBCUTANEOUS | 3 refills | Status: DC

## 2016-06-06 MED ORDER — INSULIN ASPART 100 UNIT/ML ~~LOC~~ SOLN: 0.0000 [IU] | Freq: Every day | SUBCUTANEOUS | Status: DC

## 2016-06-06 MED ORDER — INSULIN DETEMIR 100 UNIT/ML FLEXPEN: 50.0000 [IU] | PEN_INJECTOR | Freq: Every day | SUBCUTANEOUS | 0 refills | Status: DC

## 2016-06-06 MED ORDER — METFORMIN HCL 850 MG PO TABS: 850.0000 mg | ORAL_TABLET | Freq: Two times a day (BID) | ORAL | 0 refills | Status: DC

## 2016-06-06 MED ORDER — "INSULIN SYRINGE 28G X 1/2"" 1 ML MISC": 1.0000 "application " | Freq: Three times a day (TID) | 0 refills | Status: DC

## 2016-06-06 NOTE — Discharge Summary (Signed)
Physician Discharge Summary  Sherri Dixon [id number] DOB: 1981-08-20 DOA: 06/05/2016  PCP: Sherri Coma, MD  Admit date: 06/05/2016 Discharge date: 06/06/2016  Admitted From: home Disposition:  home  Recommendations for Outpatient Follow-up:  1. Follow up with PCP in 1-2 weeks. Patient in the process of seeing a new endocrinologist Dr Forde Dandy.  Home Health:none Equipment/Devices:none  Discharge Condition: stable CODE STATUS: full code Diet recommendation:  Carb Modified     Discharge Diagnoses:  Principle problem   Uncontrolled diabetes mellitus with hyperglycemia, with long-term current use of insulin ([place])  Active Problems:   GERD (gastroesophageal reflux disease)   Morbid obesity (HCC)   Hypokalemia   Diabetic ketoacidosis without Dixon associated with type 1 diabetes mellitus ([place])  Brief narrative 35 y.o. female with medical history significant of type 2 diabetes mellitus, hyperlipidemia, reflux, presents to the emergency room with chief complaint of abdominal pain, nausea vomiting as well as elevated CBGs. Patient has a history of insulin-dependent diabetes mellitus, she is currently in the process of changing endocrinologists. She used to be on NPH isophane and regular insulin, however she developed mouth sores which were believed to be related to her insulin. She was instructed to stop her insulin about couple weeks ago, and had a period for about 6-7 days where she didn't get any insulin. About 3 days ago, she was given Toujeo as a sample to take. Ever since she came off of her insulin 2 weeks ago, patient has been experiencing abdominal pain, nausea or vomiting. She denies any fevers however endorses chills. She has no dysuria. Denies any chest pain or palpitations. She is complaining of "chest congestion" and a dry cough.  ED Course:  In the ED, she was found to have elevated CBG in the 600s, she has a gap of 16, however bicarbonate was normal at 22. She was started  on insulin infusion and [facility] was asked to admit.  hospital course Hyperglycemia in the setting of uncontrolled  insulin-dependent diabetes mellitus, mild DKA Placed on stepdown unit and managed as DKA with IV hydration, insulin drip. AG closed. transitioned to levemir and premeal aspart. A1C of 14.  pt reports tolerating levemir in past.   Discharge medication regimen will include: levemir pen 50 units daily, aspart 6 units tid with meals, add Amaryl 2 mg daily and increase metformin dose to 850 mg bid.  instructed to keep a log of her CBG and show it to her pcp / new endocrinologist dueing outpt visit.  Stable for d/c home   Hyperlipidemia - Continue statin  Hypokalemia  replenished    Discharge Instructions     Medication List    STOP taking these medications   TOUJEO SOLOSTAR 300 UNIT/ML Sopn Generic drug:  Insulin Glargine     TAKE these medications   Alcohol Swabs 70 % Pads 1 application by Does not apply route 3 (three) times daily before meals.   aspirin 325 MG EC tablet Take 325 mg by mouth daily.   CENTRUM ADULTS PO Take 1 tablet by mouth daily.   glimepiride 2 MG tablet Commonly known as:  AMARYL Take 1 tablet (2 mg total) by mouth daily with breakfast.   glucose blood test strip Use as instructed   Insulin Detemir 100 UNIT/ML Pen Commonly known as:  LEVEMIR Inject 50 Units into the skin daily at 10 pm.   insulin lispro 100 UNIT/ML injection Commonly known as:  HUMALOG Inject 0.06 mLs (6 Units total) into the skin 3 (three) times  daily before meals.   INSULIN SYRINGE 1CC/28G 28G X 1/2" 1 ML Misc 1 application by Does not apply route 3 (three) times daily before meals.   metFORMIN 850 MG tablet Commonly known as:  GLUCOPHAGE Take 1 tablet (850 mg total) by mouth 2 (two) times daily. What changed:  medication strength  how much to take   naproxen sodium 220 MG tablet Commonly known as:  ANAPROX Take 400 mg by mouth every 12 (twelve)  hours as needed (pain).   simvastatin 40 MG tablet Commonly known as:  ZOCOR Take 40 mg by mouth at bedtime.      Follow-up Information    Sherri Coma, MD. Schedule an appointment as soon as possible for a visit in 1 week(s).   Specialty:  Family Medicine Contact information: [address] [place] [address] [phone]          Allergies  Allergen Reactions  . Insulin Nph Isophane & Regular Rash    Pt has this reaction to humalog & lantus when taken together   . Humalog Kwikpen [Insulin Lispro] Other (See Comments)    75/50---swelling and ulcers in mouth     Consultations: none  Procedures/Studies: Dg Chest 2 View  Result Date: 06/05/2016 CLINICAL DATA:  Cough EXAM: CHEST  2 VIEW COMPARISON:  None. FINDINGS: The heart size and mediastinal contours are within normal limits. Both lungs are clear. The visualized skeletal structures are unremarkable. IMPRESSION: No active cardiopulmonary disease. Electronically Signed   By: Kathreen Devoid   On: 06/05/2016 16:34       Subjective: Feels better. Tolerating diet. cbg improved off insulin drip  Discharge Exam: Vitals:   06/06/16 0700 06/06/16 0800  BP: 126/71 136/76  Pulse: 90 97  Resp: 18 17  Temp:  98.3 F (36.8 C)   Vitals:   06/06/16 0200 06/06/16 0400 06/06/16 0700 06/06/16 0800  BP: 122/61  126/71 136/76  Pulse: 96  90 97  Resp: 19  18 17   Temp:  98.5 F (36.9 C)  98.3 F (36.8 C)  TempSrc:  Oral  Oral  SpO2: 98%  97% 98%  Weight:      Height:        General: obese female NAD Cardiovascular: RRR, S1/S2 +, no murmurs Respiratory: clear bilaterally Abdominal: Soft, NT, ND, bowel sounds + Extremities:warm , no edema    The results of significant diagnostics from this hospitalization (including imaging, microbiology, ancillary and laboratory) are listed below for reference.     Microbiology: Recent Results (from the past 240 hour(s))  MRSA PCR Screening     Status:  None   Collection Time: 06/05/16  5:51 PM  Result Value Ref Range Status   MRSA by PCR NEGATIVE NEGATIVE Final    Comment:        The GeneXpert MRSA Assay (FDA approved for NASAL specimens only), is one component of a comprehensive MRSA colonization surveillance program. It is not intended to diagnose MRSA infection nor to guide or monitor treatment for MRSA infections.      Labs: BNP (last 3 results) No results for input(s): BNP in the last 8760 hours. Basic Metabolic Panel:  Recent Labs Lab 06/05/16 1025 06/05/16 1417 06/05/16 2000 06/06/16 0006 06/06/16 0340 06/06/16 0833  NA 130* 140 135 135 135 134*  K 4.3 4.1 4.1 3.1* 3.6 4.4  CL 92* 99* 104 105 104 102  CO2 22  --  22 23 22 23   GLUCOSE 628* 385* 271* 148*  194* 319*  BUN 14 11 12 12 13 13   CREATININE 0.89 0.60 0.61 0.66 0.57 0.68  CALCIUM 9.5  --  8.7* 8.5* 8.7* 8.8*   Liver Function Tests:  Recent Labs Lab 06/05/16 1025  AST 14*  ALT 16  ALKPHOS 137*  BILITOT 1.1  PROT 9.3*  ALBUMIN 4.1    Recent Labs Lab 06/05/16 1025  LIPASE 23   No results for input(s): AMMONIA in the last 168 hours. CBC:  Recent Labs Lab 06/05/16 1025 06/05/16 1417  WBC 13.1*  --   NEUTROABS 10.2*  --   HGB 13.5 13.6  HCT 40.7 40.0  MCV 83.4  --   PLT 331  --    Cardiac Enzymes: No results for input(s): CKTOTAL, CKMB, CKMBINDEX, TROPONINI in the last 168 hours. BNP: Invalid input(s): POCBNP CBG:  Recent Labs Lab 06/06/16 0008 06/06/16 0105 06/06/16 0206 06/06/16 0312 06/06/16 0804  GLUCAP 141* 109* 103* 168* 334*   D-Dimer No results for input(s): DDIMER in the last 72 hours. Hgb A1c  Recent Labs  06/05/16 1717  HGBA1C 14.0*   Lipid Profile No results for input(s): CHOL, HDL, LDLCALC, TRIG, CHOLHDL, LDLDIRECT in the last 72 hours. Thyroid function studies No results for input(s): TSH, T4TOTAL, T3FREE, THYROIDAB in the last 72 hours.  Invalid input(s): FREET3 Anemia work up No results for  input(s): VITAMINB12, FOLATE, FERRITIN, TIBC, IRON, RETICCTPCT in the last 72 hours. Urinalysis    Component Value Date/Time   COLORURINE YELLOW 06/05/2016 0941   APPEARANCEUR CLEAR 06/05/2016 0941   LABSPEC 1.041 (H) 06/05/2016 0941   PHURINE 5.5 06/05/2016 0941   GLUCOSEU >1000 (A) 06/05/2016 0941   HGBUR NEGATIVE 06/05/2016 0941   BILIRUBINUR NEGATIVE 06/05/2016 0941   KETONESUR 40 (A) 06/05/2016 0941   PROTEINUR NEGATIVE 06/05/2016 0941   UROBILINOGEN 1.0 08/14/2009 0850   NITRITE NEGATIVE 06/05/2016 0941   LEUKOCYTESUR NEGATIVE 06/05/2016 0941   Sepsis Labs Invalid input(s): PROCALCITONIN,  WBC,  LACTICIDVEN Microbiology Recent Results (from the past 240 hour(s))  MRSA PCR Screening     Status: None   Collection Time: 06/05/16  5:51 PM  Result Value Ref Range Status   MRSA by PCR NEGATIVE NEGATIVE Final    Comment:        The GeneXpert MRSA Assay (FDA approved for NASAL specimens only), is one component of a comprehensive MRSA colonization surveillance program. It is not intended to diagnose MRSA infection nor to guide or monitor treatment for MRSA infections.      Time coordinating discharge: Over 30 minutes  SIGNED:   Louellen Molder, MD  [facility] 06/06/2016, 11:12 AM Pager   If 7PM-7AM, please contact night-coverage [email or web address] Password [email or web address]

## 2016-06-06 NOTE — Progress Notes (Signed)
Spoke with patient about her diabetes. Was diagnosed about 5 years ago and was started on Metformin.  PCP is Dr. Cheron Schaumann, Endo is Dr. Suzette Battiest.  Has been on Lantus in the past, but Dr. Suzette Battiest started her on NPH and Regular insulin about 1-2 months ago. She developed mouth sores and was told to stop the insulin.  Started on Toujeo sample 50 units daily and was on it about 1 week.  Blood sugars were high. Is in process of changing endocrinologists.  States that she does check her blood sugars at home just once per day.Recommend Lantus or Levemir if insurance will cover. Will have case manager check on this. Will order Living Well with Diabetes booklet for her to review per patient's request.   Noted that blood sugar this am was 334 mg/dl after the IV insulin drip was stopped. If blood sugars continue to be elevated, recommend increasing Levemir to 30 units daily (140 kg X 0.2 U/kg=28 units) May need to titrate dose as needed. Patient was on Toujeo 50 units daily at home. Will continue to monitor blood sugars while in the hospital. Harvel Ricks RN BSN CDE

## 2016-06-06 NOTE — Care Management Note (Signed)
Case Management Note  Patient Details  Name: Sherri Dixon MRN: [id number] Date of Birth: [birth year]  Subjective/Objective:       dm             Action/Plan: home   Expected Discharge Date:   (unknown)               Expected Discharge Plan:  Home/Self Care  In-House Referral:     Discharge planning Services  CM Consult  Post Acute Care Choice:    Choice offered to:     DME Arranged:    DME Agency:     HH Arranged:    HH Agency:     Status of Service:  In process, will continue to follow  If discussed at Long Length of Stay Meetings, dates discussed:    Additional Comments:Date:  June 06, 2016 Chart reviewed for concurrent status and case management needs. Will continue to follow the patient for status change: Discharge Planning: following for needs Expected discharge date: [id number] Velva Harman, BSN, [place], [place]  Leeroy Cha, RN 06/06/2016, 10:14 AM

## 2016-06-06 NOTE — Discharge Instructions (Addendum)
Diabetes Mellitus and Food It is important for you to manage your blood sugar (glucose) level. Your blood glucose level can be greatly affected by what you eat. Eating healthier foods in the appropriate amounts throughout the day at about the same time each day will help you control your blood glucose level. It can also help slow or prevent worsening of your diabetes mellitus. Healthy eating may even help you improve the level of your blood pressure and reach or maintain a healthy weight.  General recommendations for healthful eating and cooking habits include:  Eating meals and snacks regularly. Avoid going long periods of time without eating to lose weight.  Eating a diet that consists mainly of plant-based foods, such as fruits, vegetables, nuts, legumes, and whole grains.  Using low-heat cooking methods, such as baking, instead of high-heat cooking methods, such as deep frying. Work with your dietitian to make sure you understand how to use the Nutrition Facts information on food labels. HOW CAN FOOD AFFECT ME? Carbohydrates Carbohydrates affect your blood glucose level more than any other type of food. Your dietitian will help you determine how many carbohydrates to eat at each meal and teach you how to count carbohydrates. Counting carbohydrates is important to keep your blood glucose at a healthy level, especially if you are using insulin or taking certain medicines for diabetes mellitus. Alcohol Alcohol can cause sudden decreases in blood glucose (hypoglycemia), especially if you use insulin or take certain medicines for diabetes mellitus. Hypoglycemia can be a life-threatening condition. Symptoms of hypoglycemia (sleepiness, dizziness, and disorientation) are similar to symptoms of having too much alcohol.  If your health care provider has given you approval to drink alcohol, do so in moderation and use the following guidelines:  Women should not have more than one drink per day, and men  should not have more than two drinks per day. One drink is equal to:  12 oz of beer.  5 oz of wine.  1 oz of hard liquor.  Do not drink on an empty stomach.  Keep yourself hydrated. Have water, diet soda, or unsweetened iced tea.  Regular soda, juice, and other mixers might contain a lot of carbohydrates and should be counted. WHAT FOODS ARE NOT RECOMMENDED? As you make food choices, it is important to remember that all foods are not the same. Some foods have fewer nutrients per serving than other foods, even though they might have the same number of calories or carbohydrates. It is difficult to get your body what it needs when you eat foods with fewer nutrients. Examples of foods that you should avoid that are high in calories and carbohydrates but low in nutrients include:  Trans fats (most processed foods list trans fats on the Nutrition Facts label).  Regular soda.  Juice.  Candy.  Sweets, such as cake, pie, doughnuts, and cookies.  Fried foods. WHAT FOODS CAN I EAT? Eat nutrient-rich foods, which will nourish your body and keep you healthy. The food you should eat also will depend on several factors, including:  The calories you need.  The medicines you take.  Your weight.  Your blood glucose level.  Your blood pressure level.  Your cholesterol level. You should eat a variety of foods, including:  Protein.  Lean cuts of meat.  Proteins low in saturated fats, such as fish, egg whites, and beans. Avoid processed meats.  Fruits and vegetables.  Fruits and vegetables that may help control blood glucose levels, such as apples, mangoes, and   yams. °· Dairy products. °¨ Choose fat-free or low-fat dairy products, such as milk, yogurt, and cheese. °· Grains, bread, pasta, and rice. °¨ Choose whole grain products, such as multigrain bread, whole oats, and brown rice. These foods may help control blood pressure. °· Fats. °¨ Foods containing healthful fats, such as nuts,  avocado, olive oil, canola oil, and fish. °DOES EVERYONE WITH DIABETES MELLITUS HAVE THE SAME MEAL PLAN? °Because every person with diabetes mellitus is different, there is not one meal plan that works for everyone. It is very important that you meet with a dietitian who will help you create a meal plan that is just right for you. °  °This information is not intended to replace advice given to you by your health care provider. Make sure you discuss any questions you have with your health care provider. °  °Document Released: 06/12/2005 Document Revised: 10/06/2014 Document Reviewed: 08/12/2013 °Elsevier Interactive Patient Education ©2016 Elsevier Inc. ° °Blood Glucose Monitoring, Adult °Monitoring your blood glucose (also know as blood sugar) helps you to manage your diabetes. It also helps you and your health care provider monitor your diabetes and determine how well your treatment plan is working. °WHY SHOULD YOU MONITOR YOUR BLOOD GLUCOSE? °· It can help you understand how food, exercise, and medicine affect your blood glucose. °· It allows you to know what your blood glucose is at any given moment. You can quickly tell if you are having low blood glucose (hypoglycemia) or high blood glucose (hyperglycemia). °· It can help you and your health care provider know how to adjust your medicines. °· It can help you understand how to manage an illness or adjust medicine for exercise. °WHEN SHOULD YOU TEST? °Your health care provider will help you decide how often you should check your blood glucose. This may depend on the type of diabetes you have, your diabetes control, or the types of medicines you are taking. Be sure to write down all of your blood glucose readings so that this information can be reviewed with your health care provider. See below for examples of testing times that your health care provider may suggest. °Type 1 Diabetes °· Test at least 2 times per day if your diabetes is well controlled, if you are  using an insulin pump, or if you perform multiple daily injections. °· If your diabetes is not well controlled or if you are sick, you may need to test more often. °· It is a good idea to also test: °¨ Before every insulin injection. °¨ Before and after exercise. °¨ Between meals and 2 hours after a meal. °¨ Occasionally between 2:00 a.m. and 3:00 a.m. °Type 2 Diabetes °· If you are taking insulin, test at least 2 times per day. However, it is best to test before every insulin injection. °· If you take medicines by mouth (orally), test 2 times a day. °· If you are on a controlled diet, test once a day. °· If your diabetes is not well controlled or if you are sick, you may need to monitor more often. °HOW TO MONITOR YOUR BLOOD GLUCOSE °Supplies Needed °· Blood glucose meter. °· Test strips for your meter. Each meter has its own strips. You must use the strips that go with your own meter. °· A pricking needle (lancet). °· A device that holds the lancet (lancing device). °· A journal or log book to write down your results. °Procedure °· Wash your hands with soap and water. Alcohol is not preferred. °·   preferred.  Prick the side of your finger (not the tip) with the lancet.  Gently milk the finger until a small drop of blood appears.  Follow the instructions that come with your meter for inserting the test strip, applying blood to the strip, and using your blood glucose meter. Other Areas to Get Blood for Testing Some meters allow you to use other areas of your body (other than your finger) to test your blood. These areas are called alternative sites. The most common alternative sites are:  The forearm.  The thigh.  The back area of the lower leg.  The palm of the hand. The blood flow in these areas is slower. Therefore, the blood glucose values you get may be delayed, and the numbers are different from what you would get from your fingers. Do not use alternative sites if you think you are having hypoglycemia.  Your reading will not be accurate. Always use a finger if you are having hypoglycemia. Also, if you cannot feel your lows (hypoglycemia unawareness), always use your fingers for your blood glucose checks. ADDITIONAL TIPS FOR GLUCOSE MONITORING  Do not reuse lancets.  Always carry your supplies with you.  All blood glucose meters have a 24-hour "hotline" number to call if you have questions or need help.  Adjust (calibrate) your blood glucose meter with a control solution after finishing a few boxes of strips. BLOOD GLUCOSE RECORD KEEPING It is a good idea to keep a daily record or log of your blood glucose readings. Most glucose meters, if not all, keep your glucose records stored in the meter. Some meters come with the ability to download your records to your home computer. Keeping a record of your blood glucose readings is especially helpful if you are wanting to look for patterns. Make notes to go along with the blood glucose readings because you might forget what happened at that exact time. Keeping good records helps you and your health care provider to work together to achieve good diabetes management.    This information is not intended to replace advice given to you by your health care provider. Make sure you discuss any questions you have with your health care provider.   Document Released: 09/18/2003 Document Revised: 10/06/2014 Document Reviewed: 02/07/2013 Elsevier Interactive Patient Education [employer].

## 2016-06-10 ENCOUNTER — Telehealth (HOSPITAL_COMMUNITY): Payer: Self-pay

## 2016-06-10 NOTE — Telephone Encounter (Signed)
Pt calling for return to work note.  Pt was admitted to [facility] under [place].  Call transferred to [facility] office #.

## 2018-05-22 ENCOUNTER — Inpatient Hospital Stay (HOSPITAL_COMMUNITY)
Admission: EM | Admit: 2018-05-22 | Discharge: 2018-05-24 | DRG: 299 | Disposition: A | Discharge status: Home / Self Care | Payer: Self-pay | Attending: Family Medicine | Admitting: Family Medicine

## 2018-05-22 ENCOUNTER — Emergency Department (HOSPITAL_COMMUNITY): Payer: Self-pay

## 2018-05-22 ENCOUNTER — Other Ambulatory Visit: Payer: Self-pay

## 2018-05-22 ENCOUNTER — Encounter (HOSPITAL_COMMUNITY): Payer: Self-pay | Admitting: Emergency Medicine

## 2018-05-22 ENCOUNTER — Emergency Department (HOSPITAL_COMMUNITY)
Admit: 2018-05-22 | Discharge: 2018-05-22 | Disposition: A | Discharge status: Home / Self Care | Payer: Self-pay | Attending: Emergency Medicine | Admitting: Emergency Medicine

## 2018-05-22 DIAGNOSIS — Z6841 Body Mass Index (BMI) 40.0 and over, adult: Secondary | ICD-10-CM

## 2018-05-22 DIAGNOSIS — I10 Essential (primary) hypertension: Secondary | ICD-10-CM | POA: Diagnosis present

## 2018-05-22 DIAGNOSIS — Z794 Long term (current) use of insulin: Secondary | ICD-10-CM

## 2018-05-22 DIAGNOSIS — I2699 Other pulmonary embolism without acute cor pulmonale: Secondary | ICD-10-CM

## 2018-05-22 DIAGNOSIS — R609 Edema, unspecified: Secondary | ICD-10-CM

## 2018-05-22 DIAGNOSIS — Z79899 Other long term (current) drug therapy: Secondary | ICD-10-CM

## 2018-05-22 DIAGNOSIS — I824Y2 Acute embolism and thrombosis of unspecified deep veins of left proximal lower extremity: Secondary | ICD-10-CM

## 2018-05-22 DIAGNOSIS — I82432 Acute embolism and thrombosis of left popliteal vein: Principal | ICD-10-CM | POA: Diagnosis present

## 2018-05-22 DIAGNOSIS — L309 Dermatitis, unspecified: Secondary | ICD-10-CM

## 2018-05-22 DIAGNOSIS — I878 Other specified disorders of veins: Secondary | ICD-10-CM | POA: Diagnosis present

## 2018-05-22 DIAGNOSIS — Z7982 Long term (current) use of aspirin: Secondary | ICD-10-CM

## 2018-05-22 DIAGNOSIS — E785 Hyperlipidemia, unspecified: Secondary | ICD-10-CM | POA: Diagnosis present

## 2018-05-22 DIAGNOSIS — Z888 Allergy status to other drugs, medicaments and biological substances status: Secondary | ICD-10-CM

## 2018-05-22 DIAGNOSIS — Z8709 Personal history of other diseases of the respiratory system: Secondary | ICD-10-CM

## 2018-05-22 DIAGNOSIS — E1165 Type 2 diabetes mellitus with hyperglycemia: Secondary | ICD-10-CM

## 2018-05-22 DIAGNOSIS — K219 Gastro-esophageal reflux disease without esophagitis: Secondary | ICD-10-CM | POA: Diagnosis present

## 2018-05-22 DIAGNOSIS — R600 Localized edema: Secondary | ICD-10-CM | POA: Diagnosis present

## 2018-05-22 DIAGNOSIS — E66813 Obesity, class 3: Secondary | ICD-10-CM

## 2018-05-22 LAB — CBC WITH DIFFERENTIAL/PLATELET
BASOS ABS: 0 10*3/uL (ref 0.0–0.1)
Basophils Relative: 0 %
EOS PCT: 3 %
Eosinophils Absolute: 0.3 10*3/uL (ref 0.0–0.7)
HCT: 30.7 % — ABNORMAL LOW (ref 36.0–46.0)
Hemoglobin: 10 g/dL — ABNORMAL LOW (ref 12.0–15.0)
LYMPHS PCT: 24 %
Lymphs Abs: 2.1 10*3/uL (ref 0.7–4.0)
MCH: 31.5 pg (ref 26.0–34.0)
MCHC: 32.6 g/dL (ref 30.0–36.0)
MCV: 96.8 fL (ref 78.0–100.0)
MONOS PCT: 2 %
Monocytes Absolute: 0.2 10*3/uL (ref 0.1–1.0)
Neutro Abs: 6.3 10*3/uL (ref 1.7–7.7)
Neutrophils Relative %: 71 %
PLATELETS: 303 10*3/uL (ref 150–400)
RBC: 3.17 MIL/uL — ABNORMAL LOW (ref 3.87–5.11)
RDW: 18.2 % — ABNORMAL HIGH (ref 11.5–15.5)
WBC: 8.9 10*3/uL (ref 4.0–10.5)

## 2018-05-22 LAB — GLUCOSE, CAPILLARY
Glucose-Capillary: 114 mg/dL — ABNORMAL HIGH (ref 70–99)
Glucose-Capillary: 181 mg/dL — ABNORMAL HIGH (ref 70–99)

## 2018-05-22 LAB — COMPREHENSIVE METABOLIC PANEL
ALT: 14 U/L (ref 0–44)
ANION GAP: 9 (ref 5–15)
AST: 21 U/L (ref 15–41)
Albumin: 3.6 g/dL (ref 3.5–5.0)
Alkaline Phosphatase: 90 U/L (ref 38–126)
BILIRUBIN TOTAL: 0.5 mg/dL (ref 0.3–1.2)
BUN: 15 mg/dL (ref 6–20)
CHLORIDE: 104 mmol/L (ref 98–111)
CO2: 26 mmol/L (ref 22–32)
Calcium: 8.8 mg/dL — ABNORMAL LOW (ref 8.9–10.3)
Creatinine, Ser: 0.6 mg/dL (ref 0.44–1.00)
Glucose, Bld: 182 mg/dL — ABNORMAL HIGH (ref 70–99)
POTASSIUM: 4 mmol/L (ref 3.5–5.1)
Sodium: 139 mmol/L (ref 135–145)
TOTAL PROTEIN: 7.3 g/dL (ref 6.5–8.1)

## 2018-05-22 LAB — TROPONIN I

## 2018-05-22 LAB — HEPARIN LEVEL (UNFRACTIONATED): HEPARIN UNFRACTIONATED: 0.51 [IU]/mL (ref 0.30–0.70)

## 2018-05-22 LAB — BRAIN NATRIURETIC PEPTIDE: B NATRIURETIC PEPTIDE 5: 111.8 pg/mL — AB (ref 0.0–100.0)

## 2018-05-22 IMAGING — CT CT ANGIO CHEST
2 of 6 series · 18 of 36 positions shown · IV contrast (ISOVUE)
Comparison: Radiographs of same day.

CLINICAL DATA: Dyspnea.

EXAM:
CT ANGIOGRAPHY CHEST WITH CONTRAST
TECHNIQUE: Multidetector CT imaging of the chest was performed using the
standard protocol during bolus administration of intravenous
contrast. Multiplanar CT image reconstructions and MIPs were
obtained to evaluate the vascular anatomy.
CONTRAST:  100mL [WD] IOPAMIDOL ([WD]) INJECTION 76%

[Series 5: thins · axial · 0.64mm/px · z∈[+142,+362]mm · 17 of 246 slices shown]
[im 13/246  lung]
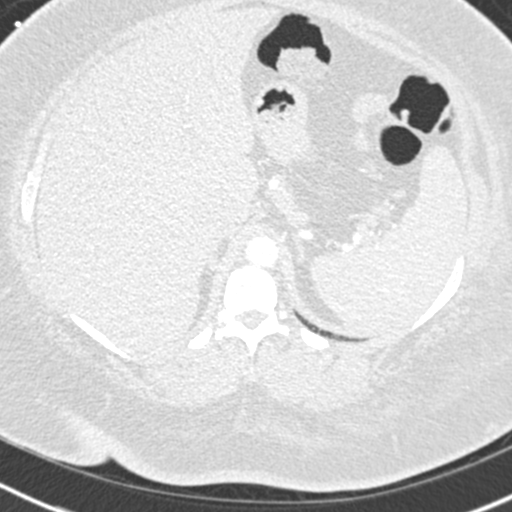
[im 25/246  mediastinal]
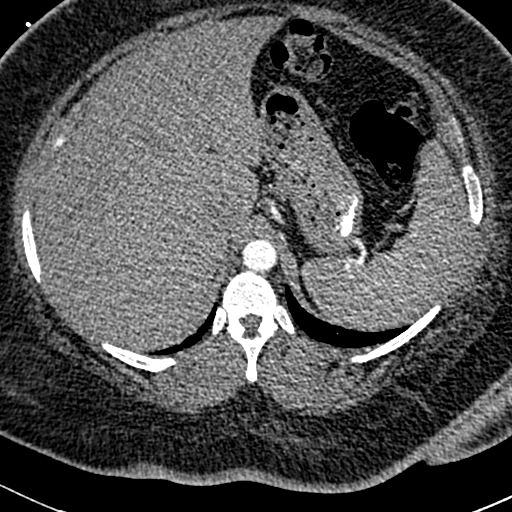
[im 37/246  lung]
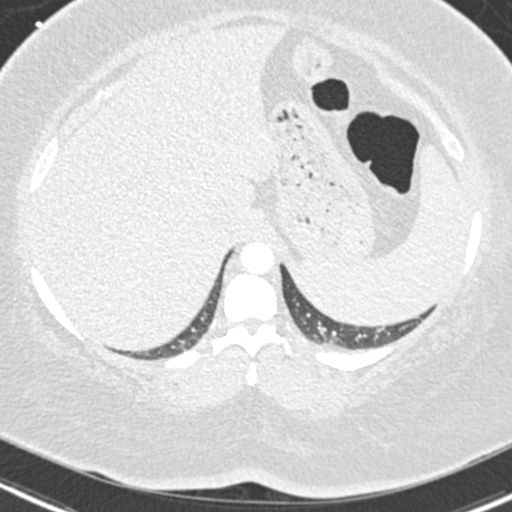
[im 50/246  mediastinal]
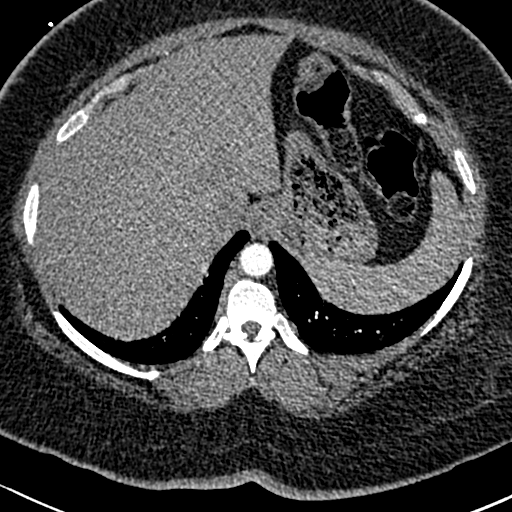
[im 74/246  lung]
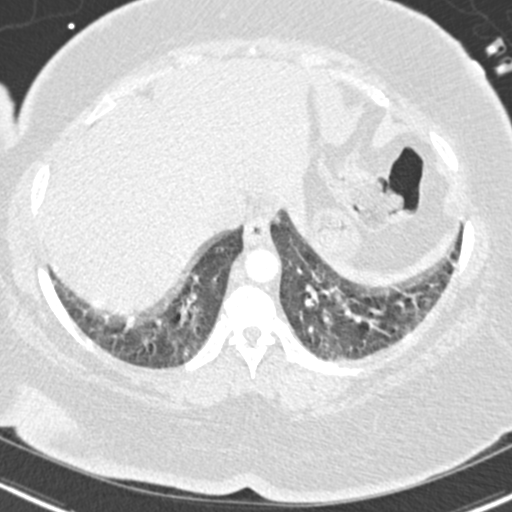
[im 86/246  mediastinal]
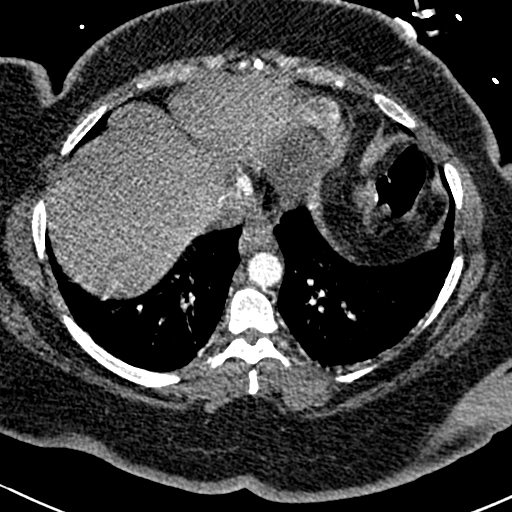
[im 99/246  lung]
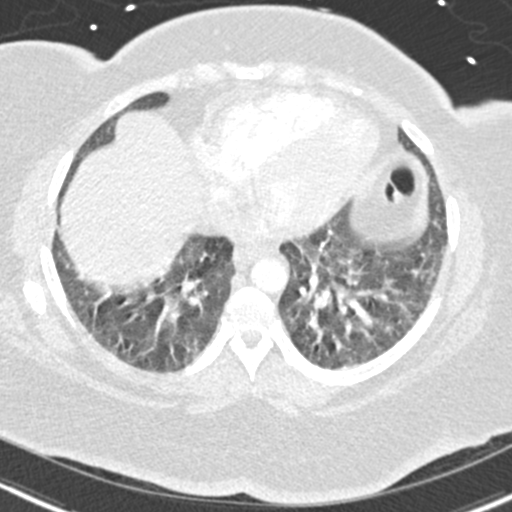
[im 111/246  mediastinal]
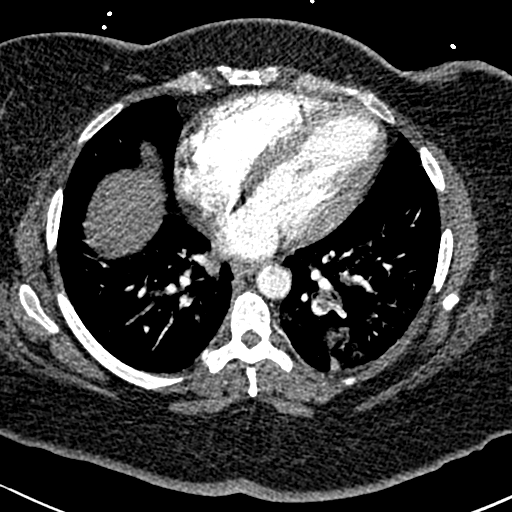
[im 123/246  lung]
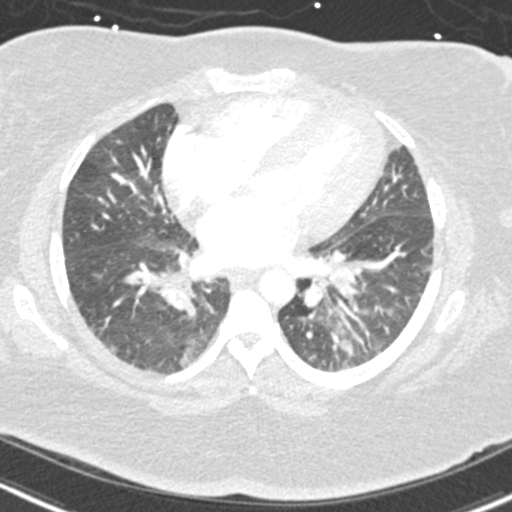
[im 135/246  mediastinal]
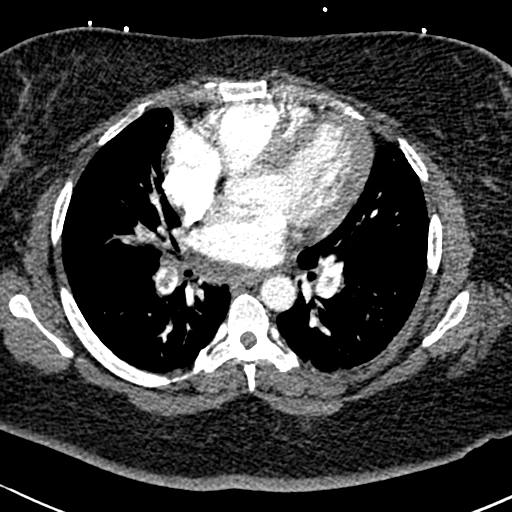
[im 148/246  lung]
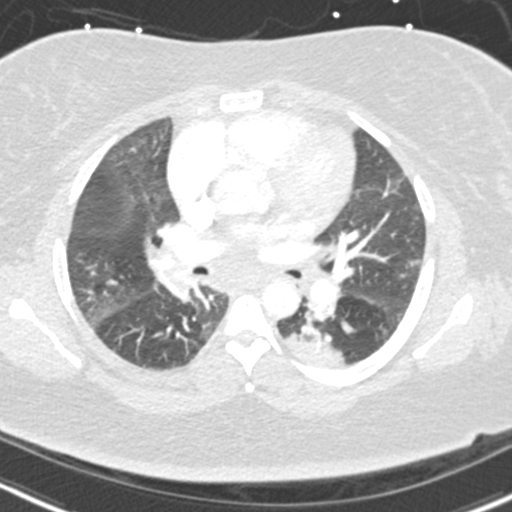
[im 160/246  mediastinal]
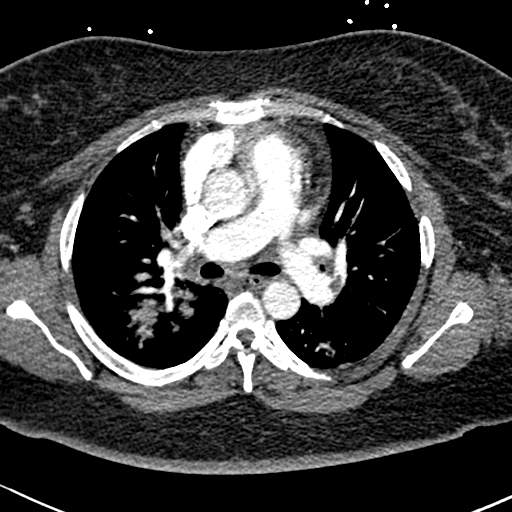
[im 172/246  lung]
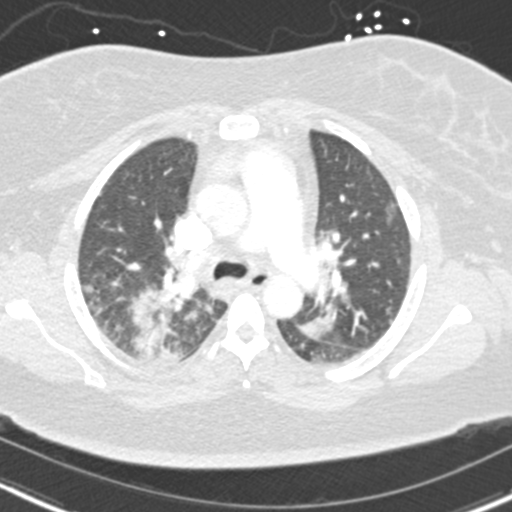
[im 197/246  mediastinal]
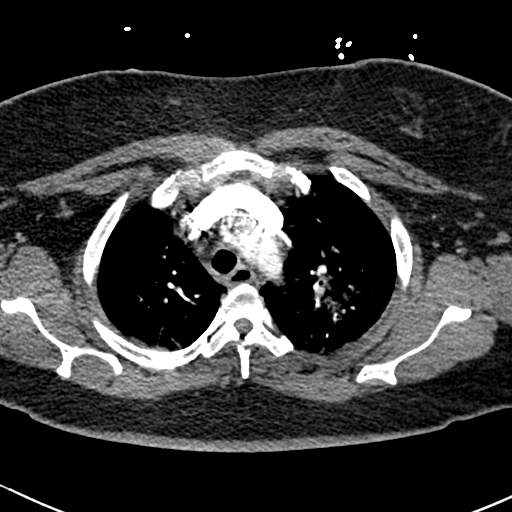
[im 209/246  lung]
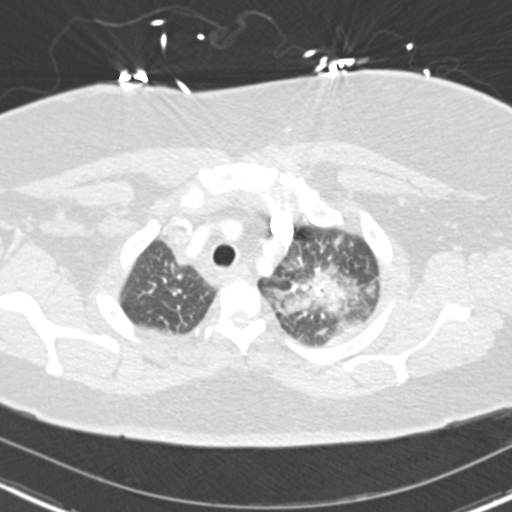
[im 221/246  mediastinal]
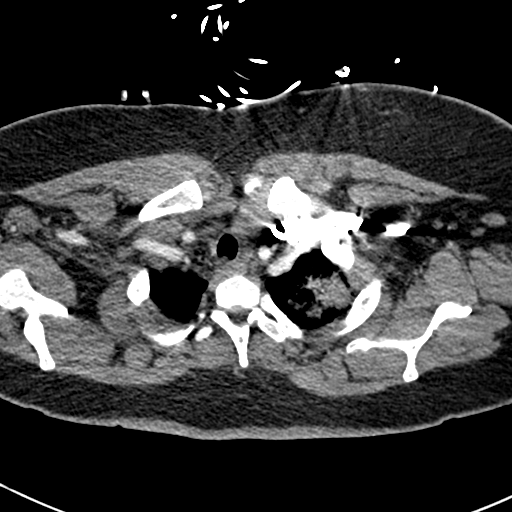
[im 233/246  lung]
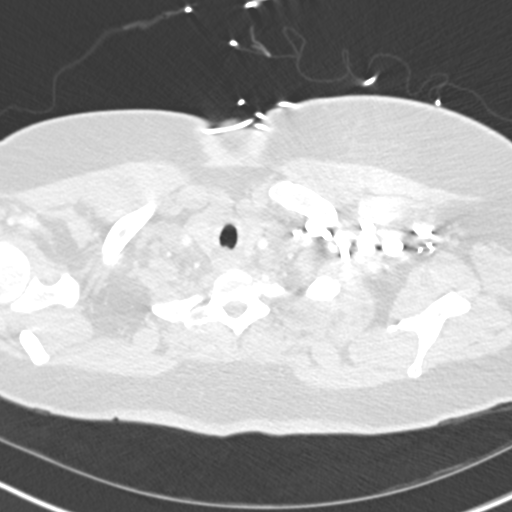

[Series 6: coronal mpr · coronal · 0.51mm/px · 1 of 151 slices shown]
[im 76/151  mediastinal]
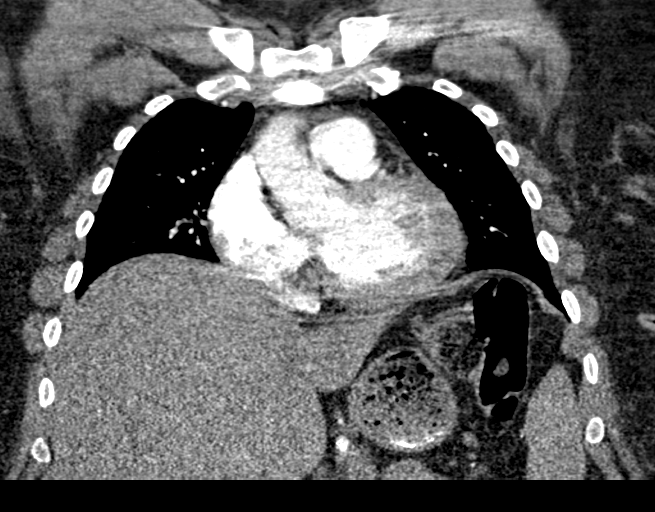

[18 of 36 positions shown; findings below may reference images not displayed]

FINDINGS: Cardiovascular: Large filling defects are noted in the lower lobes
of both pulmonary arteries consistent with pulmonary emboli, right
greater than left. RV/LV ratio of 1.0 is noted suggesting right
heart strain. Normal cardiac size. No pericardial effusion.

Mediastinum/Nodes: No enlarged mediastinal, hilar, or axillary lymph
nodes. Thyroid gland, trachea, and esophagus demonstrate no
significant findings.

Lungs/Pleura: No pneumothorax or pleural effusion is noted. Multiple
airspace opacities are noted throughout both lungs, most prominently
seen in left lung apex and right upper lobe, consistent with
multifocal pneumonia.

Upper Abdomen: No acute abnormality.

Musculoskeletal: No chest wall abnormality. No acute or significant
osseous findings.

Review of the MIP images confirms the above findings.
IMPRESSION: Large filling defects are noted in the lower lobe branches of both
pulmonary arteries consistent pulmonary emboli. Positive for acute
PE with CT evidence of right heart strain (RV/LV Ratio = 1.0)
consistent with at least submassive (intermediate risk) PE. The
presence of right heart strain has been associated with an increased
risk of morbidity and mortality. Please activate Code PE by paging
[[email or web address]]. Critical Value/emergent results were called by
telephone at the time of interpretation on [DATE] at [DATE] to
Dr. VINJEBO , who verbally acknowledged these results.

Bilateral airspace opacities are noted most consistent with
multifocal pneumonia or less likely pulmonary infarctions.

## 2018-05-22 IMAGING — CR DG CHEST 2V
2 series · 2 of 2 positions shown · non-contrast
Comparison: [DATE] chest radiograph.

CLINICAL DATA: Dyspnea, lower extremity swelling

EXAM:
CHEST - 2 VIEW

[w chest pa]
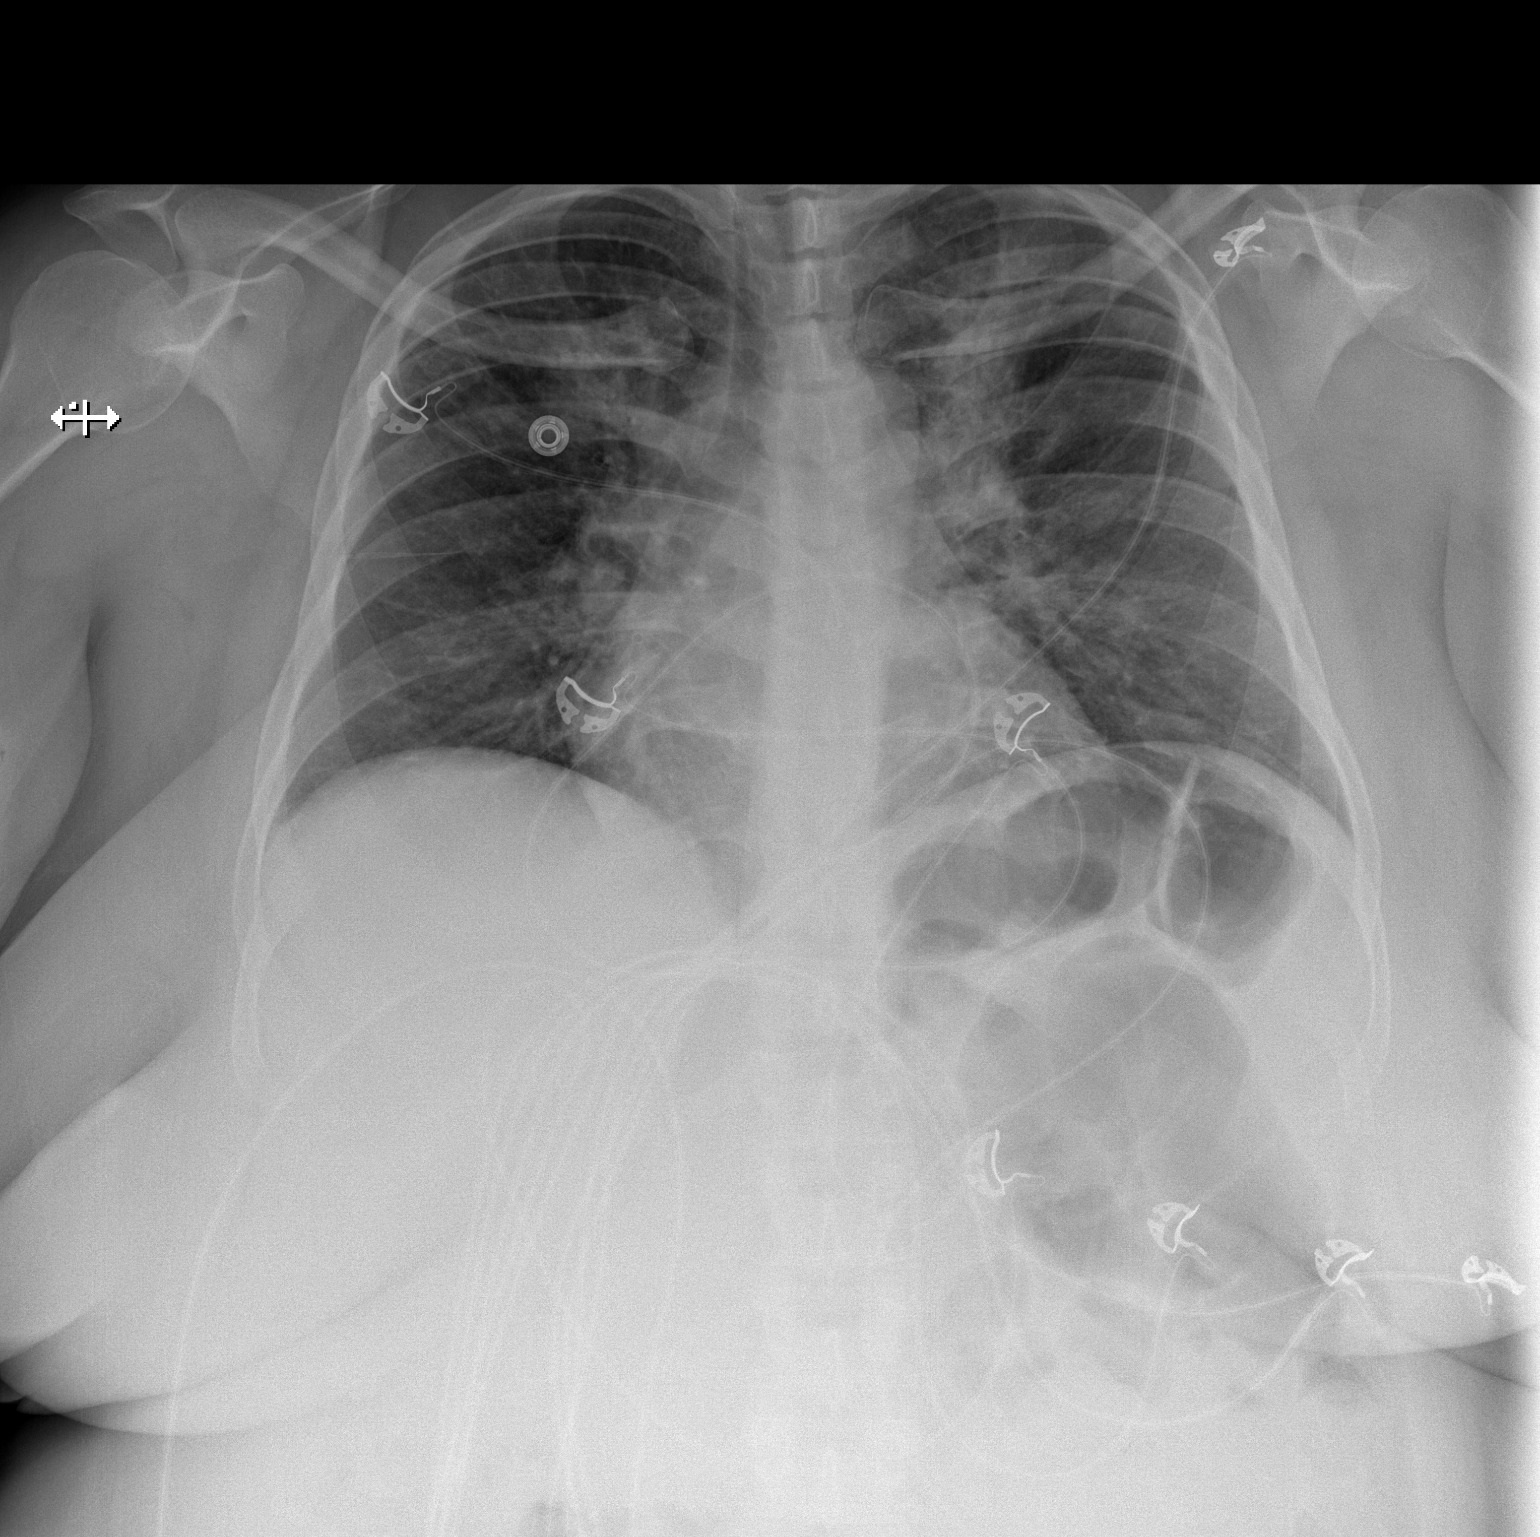

[w chest lat]
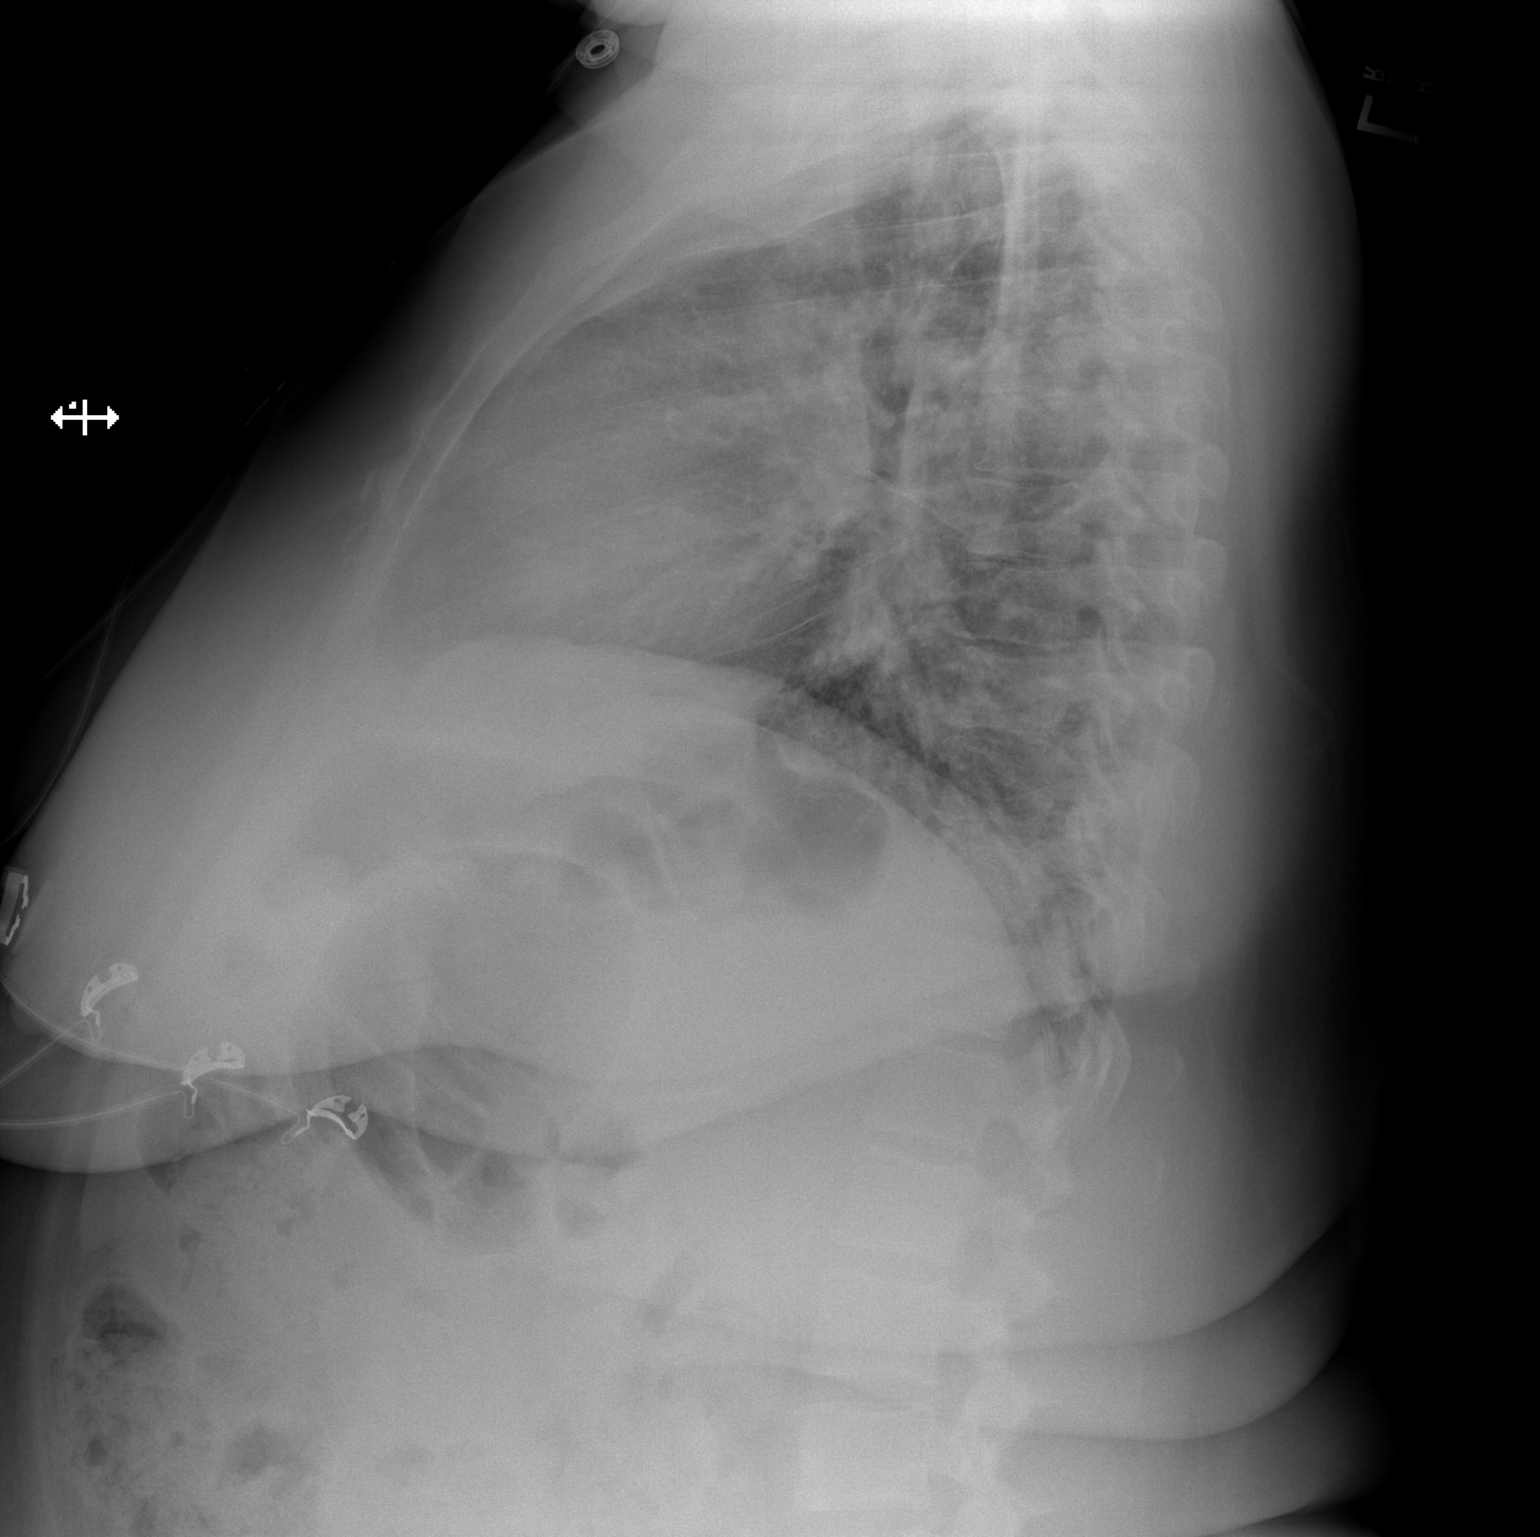

[2 of 2 positions shown; findings below may reference images not displayed]

FINDINGS: Top-normal heart size. Normal mediastinal contour. No pneumothorax.
No pleural effusions. Mild patchy bilateral parahilar lung
opacities, left greater than right.
IMPRESSION: Mild patchy bilateral parahilar lung opacities, which could
represent mild pulmonary edema or multilobar pneumonia. Recommend
attention on follow-up chest radiographs.

## 2018-05-22 MED ORDER — LEVOFLOXACIN IN D5W 750 MG/150ML IV SOLN
750.0000 mg | Freq: Once | INTRAVENOUS | Status: AC
  Administered 2018-05-22: 750 mg via INTRAVENOUS
  Filled 2018-05-22: qty 150

## 2018-05-22 MED ORDER — SODIUM CHLORIDE 0.9 % IV SOLN: 250.0000 mL | INTRAVENOUS | Status: DC | PRN

## 2018-05-22 MED ORDER — ACETAMINOPHEN 650 MG RE SUPP: 650.0000 mg | Freq: Four times a day (QID) | RECTAL | Status: DC | PRN

## 2018-05-22 MED ORDER — HEPARIN BOLUS VIA INFUSION
3000.0000 [IU] | Freq: Once | INTRAVENOUS | Status: AC
  Administered 2018-05-22: 3000 [IU] via INTRAVENOUS
  Filled 2018-05-22: qty 3000

## 2018-05-22 MED ORDER — SIMVASTATIN 40 MG PO TABS
40.0000 mg | ORAL_TABLET | Freq: Every day | ORAL | Status: DC
  Administered 2018-05-22 – 2018-05-23 (×2): 40 mg via ORAL
  Filled 2018-05-22 (×2): qty 1

## 2018-05-22 MED ORDER — ONDANSETRON HCL 4 MG PO TABS
4.0000 mg | ORAL_TABLET | Freq: Four times a day (QID) | ORAL | Status: DC | PRN
  Administered 2018-05-23: 4 mg via ORAL
  Filled 2018-05-22: qty 1

## 2018-05-22 MED ORDER — ACETAMINOPHEN 325 MG PO TABS: 650.0000 mg | ORAL_TABLET | Freq: Four times a day (QID) | ORAL | Status: DC | PRN

## 2018-05-22 MED ORDER — SODIUM CHLORIDE 0.9% FLUSH: 3.0000 mL | INTRAVENOUS | Status: DC | PRN

## 2018-05-22 MED ORDER — IBUPROFEN 200 MG PO TABS
400.0000 mg | ORAL_TABLET | Freq: Four times a day (QID) | ORAL | Status: DC | PRN
  Administered 2018-05-22 – 2018-05-23 (×2): 400 mg via ORAL
  Filled 2018-05-22 (×2): qty 2

## 2018-05-22 MED ORDER — IOPAMIDOL (ISOVUE-370) INJECTION 76%
INTRAVENOUS | Status: AC
  Filled 2018-05-22: qty 100

## 2018-05-22 MED ORDER — ONDANSETRON HCL 4 MG/2ML IJ SOLN: 4.0000 mg | Freq: Four times a day (QID) | INTRAMUSCULAR | Status: DC | PRN

## 2018-05-22 MED ORDER — IRBESARTAN 150 MG PO TABS
150.0000 mg | ORAL_TABLET | Freq: Every day | ORAL | Status: DC
  Administered 2018-05-23 – 2018-05-24 (×2): 150 mg via ORAL
  Filled 2018-05-22 (×2): qty 1

## 2018-05-22 MED ORDER — INSULIN ASPART 100 UNIT/ML ~~LOC~~ SOLN
6.0000 [IU] | Freq: Three times a day (TID) | SUBCUTANEOUS | Status: DC
  Administered 2018-05-23 – 2018-05-24 (×4): 6 [IU] via SUBCUTANEOUS

## 2018-05-22 MED ORDER — IOPAMIDOL (ISOVUE-370) INJECTION 76%
100.0000 mL | Freq: Once | INTRAVENOUS | Status: AC | PRN
  Administered 2018-05-22: 100 mL via INTRAVENOUS

## 2018-05-22 MED ORDER — SODIUM CHLORIDE 0.9% FLUSH
3.0000 mL | Freq: Two times a day (BID) | INTRAVENOUS | Status: DC
  Administered 2018-05-22 – 2018-05-24 (×3): 3 mL via INTRAVENOUS

## 2018-05-22 MED ORDER — HEPARIN (PORCINE) IN NACL 100-0.45 UNIT/ML-% IJ SOLN
1750.0000 [IU]/h | INTRAMUSCULAR | Status: AC
  Administered 2018-05-22 – 2018-05-24 (×4): 1750 [IU]/h via INTRAVENOUS
  Filled 2018-05-22 (×4): qty 250

## 2018-05-22 NOTE — Progress Notes (Signed)
Left lower extremity venous duplex has been completed. There is evidence of age indeterminate deep vein thrombosis involving the popliteal vein of the left lower extremity. Results were given to Dr. Dayna Barker.  05/22/18 11:37 AM Sherri Dixon RVT

## 2018-05-22 NOTE — H&P (Signed)
History and Physical    Sherri Dixon  MLJ:[id number]  DOB: 03/16/1981  DOA: 05/22/2018 PCP: Jonathon Jordan, MD   Patient coming from: home  Chief Complaint: shortness of breath  HPI: Sherri Dixon is a 37 y.o. female with medical history of DM, HTN, HLD, obesity who presents for shortness of breath with ambulating. She noticed it about 2 days ago. She has a cough which is nonproductive but it has been going on for weeks. She thinks it due to dusty carpen.  She also has had swelling of her legs, L> R. Her legs have been swollen for months but her left is worse for about 2 wks now. She feels pain in her legs. She is found to have pulmonary emboli and is admitted for further work up.  She does not smoke, vape or use Oral contraceptive. 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 She left the job about 2 wks ago around the time her left leg began to swell. No h/o blood clots in her family.   ED Course: started on a Heparin infusion   Review of Systems:  Had asthma as a child and has a dry cough as mentioned Has a left upper jaw /tooth ache Has anxiety with palpitations at times Has heart burn. All other systems reviewed and apart from HPI, are negative.  Past Medical History:  Diagnosis Date  . Diabetes mellitus without complication ([place])   . Dyslipidemia   . GERD (gastroesophageal reflux disease)     History reviewed. No pertinent surgical history.  Social History:   reports that she has never smoked. She has never used smokeless tobacco. She reports that she does not drink alcohol or use drugs.  Allergies  Allergen Reactions  . Insulin Nph Isophane & Regular Rash    Pt has this reaction to humalog & lantus when taken together   . Humalog Kwikpen [Insulin Lispro] Other (See Comments)    75/50---swelling and ulcers in mouth     Family History  Problem Relation Age of Onset  . High blood pressure Mother   . Diabetes Mother   .  Arthritis Mother   . Hyperlipidemia Mother   . High blood pressure Father   . Hyperlipidemia Father   . High blood pressure Sister   . Diabetes Brother   . Hyperlipidemia Brother   . High blood pressure Brother      Prior to Admission medications   Medication Sig Start Date End Date Taking? Authorizing Provider  Alcohol Swabs 70 % PADS 1 application by Does not apply route 3 (three) times daily before meals. 06/06/16  Yes Dhungel, Nishant, MD  aspirin 81 MG chewable tablet Chew 81 mg by mouth daily.    Yes [provider]  cetirizine (ZYRTEC) 10 MG tablet Take 10 mg by mouth daily. 11/14/17 11/14/18 Yes [provider]  fluticasone (FLONASE) 50 MCG/ACT nasal spray Place 2 sprays into the nose daily as needed. 11/14/17 11/14/18 Yes [provider]  glucose blood test strip Use as instructed 06/06/16  Yes Dhungel, Nishant, MD  Insulin Degludec 200 UNIT/ML SOPN Inject 60-68 Units into the skin daily. 04/27/18  Yes [provider]  insulin lispro (HUMALOG) 100 UNIT/ML injection Inject 0.06 mLs (6 Units total) into the skin 3 (three) times daily before meals. Patient taking differently: Inject 12 Units into the skin 3 (three) times daily before meals.  06/06/16  Yes Dhungel, Nishant,  MD  Insulin Syringe-Needle U-100 (INSULIN SYRINGE 1CC/28G) 28G X 1/2" 1 ML MISC 1 application by Does not apply route 3 (three) times daily before meals. 06/06/16  Yes Dhungel, Nishant, MD  Multiple Vitamins-Minerals (CENTRUM ADULTS PO) Take 1 tablet by mouth daily.   Yes [provider]  olmesartan (BENICAR) 20 MG tablet Take 20 mg by mouth daily. 03/20/18  Yes [provider]  simvastatin (ZOCOR) 40 MG tablet Take 40 mg by mouth at bedtime. 03/28/16  Yes [provider]    Physical Exam: Wt Readings from Last 3 Encounters:  05/22/18 (S) 131.5 kg  06/05/16 (!) 140.6 kg   Vitals:   05/22/18 1400 05/22/18 1440 05/22/18 1500 05/22/18 1552  BP: (!) 175/99 (!)  166/92 (!) 179/98 (!) 143/132  Pulse: 90 82 89 85  Resp: 19 16 18 14   Temp:  98.8 F (37.1 C)    TempSrc:  Oral    SpO2: 97% 100% 100% 100%  Weight:      Height:          Constitutional:  Calm & comfortable Eyes: PERRLA, lids and conjunctivae normal ENT:  Mucous membranes are moist.  Pharynx clear of exudate   Normal dentition.  Neck: Supple, no masses  Respiratory:  Clear to auscultation bilaterally  Normal respiratory effort.  Cardiovascular:  S1 & S2 heard, regular rate and rhythm No Murmurs Abdomen:  Non distended No tenderness, No masses Bowel sounds normal Extremities:  No clubbing / cyanosis + 3 Pedal edema in left leg and +2 in right leg No joint deformity    Skin:  No rashes, lesions or ulcers- petechiae on left leg Neurologic:  AAO x 3 CN 2-12 grossly intact Sensation intact Strength 5/5 in all 4 extremities Psychiatric:  Normal Mood and affect    Labs on Admission: I have personally reviewed following labs and imaging studies  CBC: Recent Labs  Lab 05/22/18 1050  WBC 8.9  NEUTROABS 6.3  HGB 10.0*  HCT 30.7*  MCV 96.8  PLT 025   Basic Metabolic Panel: Recent Labs  Lab 05/22/18 1050  NA 139  K 4.0  CL 104  CO2 26  GLUCOSE 182*  BUN 15  CREATININE 0.60  CALCIUM 8.8*   GFR: Estimated Creatinine Clearance: 129.4 mL/min (by C-G formula based on SCr of 0.6 mg/dL). Liver Function Tests: Recent Labs  Lab 05/22/18 1050  AST 21  ALT 14  ALKPHOS 90  BILITOT 0.5  PROT 7.3  ALBUMIN 3.6   No results for input(s): LIPASE, AMYLASE in the last 168 hours. No results for input(s): AMMONIA in the last 168 hours. Coagulation Profile: No results for input(s): INR, PROTIME in the last 168 hours. Cardiac Enzymes: Recent Labs  Lab 05/22/18 1050 05/22/18 1659  TROPONINI <0.03 <0.03   BNP (last 3 results) No results for input(s): PROBNP in the last 8760 hours. HbA1C: No results for input(s): HGBA1C in the last 72 hours. CBG: No  results for input(s): GLUCAP in the last 168 hours. Lipid Profile: No results for input(s): CHOL, HDL, LDLCALC, TRIG, CHOLHDL, LDLDIRECT in the last 72 hours. Thyroid Function Tests: No results for input(s): TSH, T4TOTAL, FREET4, T3FREE, THYROIDAB in the last 72 hours. Anemia Panel: No results for input(s): VITAMINB12, FOLATE, FERRITIN, TIBC, IRON, RETICCTPCT in the last 72 hours. Urine analysis:    Component Value Date/Time   COLORURINE YELLOW 06/05/2016 [place] 06/05/2016 0941   LABSPEC 1.041 (H) 06/05/2016 0941   PHURINE 5.5 06/05/2016 0941  GLUCOSEU >1000 (A) 06/05/2016 0941   HGBUR NEGATIVE 06/05/2016 0941   BILIRUBINUR NEGATIVE 06/05/2016 0941   KETONESUR 40 (A) 06/05/2016 0941   PROTEINUR NEGATIVE 06/05/2016 0941   UROBILINOGEN 1.0 08/14/2009 0850   NITRITE NEGATIVE 06/05/2016 0941   LEUKOCYTESUR NEGATIVE 06/05/2016 0941   Sepsis Labs: @LABRCNTIP (procalcitonin:4,lacticidven:4) )No results found for this or any previous visit (from the past 240 hour(s)).   Radiological Exams on Admission: Dg Chest 2 View  Result Date: 05/22/2018 CLINICAL DATA:  Dyspnea, lower extremity swelling EXAM: CHEST - 2 VIEW COMPARISON:  06/05/2016 chest radiograph. FINDINGS: Top-normal heart size. Normal mediastinal contour. No pneumothorax. No pleural effusions. Mild patchy bilateral parahilar lung opacities, left greater than right. IMPRESSION: Mild patchy bilateral parahilar lung opacities, which could represent mild pulmonary edema or multilobar pneumonia. Recommend attention on follow-up chest radiographs. Electronically Signed   By: Ilona Sorrel M.D.   On: 05/22/2018 11:36   Ct Angio Chest Pe W And/or Wo Contrast  Result Date: 05/22/2018 CLINICAL DATA:  Dyspnea. EXAM: CT ANGIOGRAPHY CHEST WITH CONTRAST TECHNIQUE: Multidetector CT imaging of the chest was performed using the standard protocol during bolus administration of intravenous contrast. Multiplanar CT image  reconstructions and MIPs were obtained to evaluate the vascular anatomy. CONTRAST:  172mL ISOVUE-370 IOPAMIDOL (ISOVUE-370) INJECTION 76% COMPARISON:  Radiographs of same day. FINDINGS: Cardiovascular: Large filling defects are noted in the lower lobes of both pulmonary arteries consistent with pulmonary emboli, right greater than left. RV/LV ratio of 1.0 is noted suggesting right heart strain. Normal cardiac size. No pericardial effusion. Mediastinum/Nodes: No enlarged mediastinal, hilar, or axillary lymph nodes. Thyroid gland, trachea, and esophagus demonstrate no significant findings. Lungs/Pleura: No pneumothorax or pleural effusion is noted. Multiple airspace opacities are noted throughout both lungs, most prominently seen in left lung apex and right upper lobe, consistent with multifocal pneumonia. Upper Abdomen: No acute abnormality. Musculoskeletal: No chest wall abnormality. No acute or significant osseous findings. Review of the MIP images confirms the above findings. IMPRESSION: Large filling defects are noted in the lower lobe branches of both pulmonary arteries consistent pulmonary emboli. Positive for acute PE with CT evidence of right heart strain (RV/LV Ratio = 1.0) consistent with at least submassive (intermediate risk) PE. The presence of right heart strain has been associated with an increased risk of morbidity and mortality. Please activate Code PE by paging [phone]. Critical Value/emergent results were called by telephone at the time of interpretation on 05/22/2018 at 1:08 pm to Dr. Merrily Pew , who verbally acknowledged these results. Bilateral airspace opacities are noted most consistent with multifocal pneumonia or less likely pulmonary infarctions. Electronically Signed   By: Marijo Conception, M.D.   On: 05/22/2018 13:09    EKG: Independently reviewed.QT 495, T wave inversions in lead III, aVF, V3, V 4  Assessment/Plan Principal Problem:   Pulmonary emboli / left leg DVT -  ultrasound in ED shows and indeterminate DVT in left Popliteal vein - Heparin infusion ordered by ED- hold Asprin which she takes for prevention - ED did confer with PCCM and they did not feel the patient warrants a thrombolysis - pulse ox is 99% on room air, BP is high - ? Right heart strain on CT- check troponin and ECHO - risk factor seems to be her recent sedentary job, chronic pedal edema due to venous stasis & morbid obesity - check pulse ox tomorrow to assess for O2 requirements on ambulation  Active Problems: ? Pneumnonia vs infarcts on CT - no leukocytosis or  fever, cough is nonproductive- I am not certain this is pneumonia -  she was given Levaquin in the ED - ? Is cough is related to asthma or allergies - will hold off on on further antibiotics- follow symptoms    Uncontrolled diabetes mellitus with hyperglycemia, with long-term current use of insulin  -cont Insulin doses but a lower than home doses- add SSI  Pedal edema - TEDS  HTN - cont ARB  HLD - cont Zocor    Obesity, Class III, BMI 40-49.9 (morbid obesity) Body mass index is 48.26 kg/m.    Eczema     DVT prophylaxis: Heparin infusion Code Status: Full code  Family Communication: sister  Disposition Plan: telemetry  Consults called: ED spoke with PCCM  Admission status: inpatient    Debbe Odea MD [facility] Pager: [email or web address] Password [email or web address] 7PM-7AM, please contact night-coverage   05/22/2018, 5:52 PM

## 2018-05-22 NOTE — ED Notes (Signed)
Hospitalist at bedside with patient.

## 2018-05-22 NOTE — H&P (Signed)
History and Physical    [name]  ZOX:[id number]  DOB: 1981/05/21  DOA: 05/22/2018 PCP: Jonathon Jordan, MD   Patient coming from: home  Chief Complaint: shortness of breath  HPI: Sherri Dixon is a 37 y.o. female with medical history of DM, HTN, HLD, obesity who presents for shortness of breath with ambulating. She noticed it about 2 days ago. She has a cough which is nonproductive but it has been going on for weeks. She thinks it due to dusty carpen.  She also has had swelling of her legs, L> R. Her legs have been swollen for months but her left is worse for about 2 wks now. She feels pain in her legs. She is found to have pulmonary emboli and is admitted for further work up.  She does not smoke, vape or use Oral contraceptive. 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 She left the job about 2 wks ago around the time her left leg began to swell. No h/o blood clots in her family.   ED Course: started on a Heparin infusion   Review of Systems:  Had asthma as a child and has a dry cough as mentioned Has a left upper jaw /tooth ache Has anxiety with palpitations at times Has heart burn. All other systems reviewed and apart from HPI, are negative.  Past Medical History:  Diagnosis Date  . Diabetes mellitus without complication ([place])   . Dyslipidemia   . GERD (gastroesophageal reflux disease)     History reviewed. No pertinent surgical history.  Social History:   reports that she has never smoked. She has never used smokeless tobacco. She reports that she does not drink alcohol or use drugs.  Allergies  Allergen Reactions  . Insulin Nph Isophane & Regular Rash    Pt has this reaction to humalog & lantus when taken together   . Humalog Kwikpen [Insulin Lispro] Other (See Comments)    75/50---swelling and ulcers in mouth     Family History  Problem Relation Age of Onset  . High blood pressure Mother   . Diabetes Mother   .  Arthritis Mother   . Hyperlipidemia Mother   . High blood pressure Father   . Hyperlipidemia Father   . High blood pressure Sister   . Diabetes Brother   . Hyperlipidemia Brother   . High blood pressure Brother      Prior to Admission medications   Medication Sig Start Date End Date Taking? Authorizing Provider  Alcohol Swabs 70 % PADS 1 application by Does not apply route 3 (three) times daily before meals. 06/06/16  Yes Dhungel, Nishant, MD  aspirin 81 MG chewable tablet Chew 81 mg by mouth daily.    Yes [provider]  cetirizine (ZYRTEC) 10 MG tablet Take 10 mg by mouth daily. 11/14/17 11/14/18 Yes [provider]  fluticasone (FLONASE) 50 MCG/ACT nasal spray Place 2 sprays into the nose daily as needed. 11/14/17 11/14/18 Yes [provider]  glucose blood test strip Use as instructed 06/06/16  Yes Dhungel, Nishant, MD  Insulin Degludec 200 UNIT/ML SOPN Inject 60-68 Units into the skin daily. 04/27/18  Yes [provider]  insulin lispro (HUMALOG) 100 UNIT/ML injection Inject 0.06 mLs (6 Units total) into the skin 3 (three) times daily before meals. Patient taking differently: Inject 12 Units into the skin 3 (three) times daily before meals.  06/06/16  Yes Dhungel, Nishant,  MD  Insulin Syringe-Needle U-100 (INSULIN SYRINGE 1CC/28G) 28G X 1/2" 1 ML MISC 1 application by Does not apply route 3 (three) times daily before meals. 06/06/16  Yes Dhungel, Nishant, MD  Multiple Vitamins-Minerals (CENTRUM ADULTS PO) Take 1 tablet by mouth daily.   Yes [provider]  olmesartan (BENICAR) 20 MG tablet Take 20 mg by mouth daily. 03/20/18  Yes [provider]  simvastatin (ZOCOR) 40 MG tablet Take 40 mg by mouth at bedtime. 03/28/16  Yes [provider]    Physical Exam: Wt Readings from Last 3 Encounters:  05/22/18 (S) 131.5 kg  06/05/16 (!) 140.6 kg   Vitals:   05/22/18 1256 05/22/18 1300 05/22/18 1311 05/22/18 1331  BP: (!) 180/100 (!)  171/101  (!) 165/101  Pulse: 87 88  97  Resp: 15 17  19   Temp:      TempSrc:      SpO2: 100% 99%  93%  Weight:   (S) 131.5 kg   Height:   (S) 5\' 5"  (1.651 m)       Constitutional:  Calm & comfortable Eyes: PERRLA, lids and conjunctivae normal ENT:  Mucous membranes are moist.  Pharynx clear of exudate   Normal dentition.  Neck: Supple, no masses  Respiratory:  Clear to auscultation bilaterally  Normal respiratory effort.  Cardiovascular:  S1 & S2 heard, regular rate and rhythm No Murmurs Abdomen:  Non distended No tenderness, No masses Bowel sounds normal Extremities:  No clubbing / cyanosis + 3 Pedal edema in left leg and +2 in right leg No joint deformity    Skin:  No rashes, lesions or ulcers- petechiae on left leg Neurologic:  AAO x 3 CN 2-12 grossly intact Sensation intact Strength 5/5 in all 4 extremities Psychiatric:  Normal Mood and affect    Labs on Admission: I have personally reviewed following labs and imaging studies  CBC: Recent Labs  Lab 05/22/18 1050  WBC 8.9  NEUTROABS 6.3  HGB 10.0*  HCT 30.7*  MCV 96.8  PLT 161   Basic Metabolic Panel: Recent Labs  Lab 05/22/18 1050  NA 139  K 4.0  CL 104  CO2 26  GLUCOSE 182*  BUN 15  CREATININE 0.60  CALCIUM 8.8*   GFR: Estimated Creatinine Clearance: 129.4 mL/min (by C-G formula based on SCr of 0.6 mg/dL). Liver Function Tests: Recent Labs  Lab 05/22/18 1050  AST 21  ALT 14  ALKPHOS 90  BILITOT 0.5  PROT 7.3  ALBUMIN 3.6   No results for input(s): LIPASE, AMYLASE in the last 168 hours. No results for input(s): AMMONIA in the last 168 hours. Coagulation Profile: No results for input(s): INR, PROTIME in the last 168 hours. Cardiac Enzymes: Recent Labs  Lab 05/22/18 1050  TROPONINI <0.03   BNP (last 3 results) No results for input(s): PROBNP in the last 8760 hours. HbA1C: No results for input(s): HGBA1C in the last 72 hours. CBG: No results for input(s): GLUCAP in  the last 168 hours. Lipid Profile: No results for input(s): CHOL, HDL, LDLCALC, TRIG, CHOLHDL, LDLDIRECT in the last 72 hours. Thyroid Function Tests: No results for input(s): TSH, T4TOTAL, FREET4, T3FREE, THYROIDAB in the last 72 hours. Anemia Panel: No results for input(s): VITAMINB12, FOLATE, FERRITIN, TIBC, IRON, RETICCTPCT in the last 72 hours. Urine analysis:    Component Value Date/Time   COLORURINE YELLOW 06/05/2016 [place] 06/05/2016 0941   LABSPEC 1.041 (H) 06/05/2016 0941   PHURINE 5.5 06/05/2016 0941  GLUCOSEU >1000 (A) 06/05/2016 0941   HGBUR NEGATIVE 06/05/2016 0941   BILIRUBINUR NEGATIVE 06/05/2016 0941   KETONESUR 40 (A) 06/05/2016 0941   PROTEINUR NEGATIVE 06/05/2016 0941   UROBILINOGEN 1.0 08/14/2009 0850   NITRITE NEGATIVE 06/05/2016 0941   LEUKOCYTESUR NEGATIVE 06/05/2016 0941   Sepsis Labs: @LABRCNTIP (procalcitonin:4,lacticidven:4) )No results found for this or any previous visit (from the past 240 hour(s)).   Radiological Exams on Admission: Dg Chest 2 View  Result Date: 05/22/2018 CLINICAL DATA:  Dyspnea, lower extremity swelling EXAM: CHEST - 2 VIEW COMPARISON:  06/05/2016 chest radiograph. FINDINGS: Top-normal heart size. Normal mediastinal contour. No pneumothorax. No pleural effusions. Mild patchy bilateral parahilar lung opacities, left greater than right. IMPRESSION: Mild patchy bilateral parahilar lung opacities, which could represent mild pulmonary edema or multilobar pneumonia. Recommend attention on follow-up chest radiographs. Electronically Signed   By: Ilona Sorrel M.D.   On: 05/22/2018 11:36   Ct Angio Chest Pe W And/or Wo Contrast  Result Date: 05/22/2018 CLINICAL DATA:  Dyspnea. EXAM: CT ANGIOGRAPHY CHEST WITH CONTRAST TECHNIQUE: Multidetector CT imaging of the chest was performed using the standard protocol during bolus administration of intravenous contrast. Multiplanar CT image reconstructions and MIPs were obtained to  evaluate the vascular anatomy. CONTRAST:  14mL ISOVUE-370 IOPAMIDOL (ISOVUE-370) INJECTION 76% COMPARISON:  Radiographs of same day. FINDINGS: Cardiovascular: Large filling defects are noted in the lower lobes of both pulmonary arteries consistent with pulmonary emboli, right greater than left. RV/LV ratio of 1.0 is noted suggesting right heart strain. Normal cardiac size. No pericardial effusion. Mediastinum/Nodes: No enlarged mediastinal, hilar, or axillary lymph nodes. Thyroid gland, trachea, and esophagus demonstrate no significant findings. Lungs/Pleura: No pneumothorax or pleural effusion is noted. Multiple airspace opacities are noted throughout both lungs, most prominently seen in left lung apex and right upper lobe, consistent with multifocal pneumonia. Upper Abdomen: No acute abnormality. Musculoskeletal: No chest wall abnormality. No acute or significant osseous findings. Review of the MIP images confirms the above findings. IMPRESSION: Large filling defects are noted in the lower lobe branches of both pulmonary arteries consistent pulmonary emboli. Positive for acute PE with CT evidence of right heart strain (RV/LV Ratio = 1.0) consistent with at least submassive (intermediate risk) PE. The presence of right heart strain has been associated with an increased risk of morbidity and mortality. Please activate Code PE by paging [phone]. Critical Value/emergent results were called by telephone at the time of interpretation on 05/22/2018 at 1:08 pm to Dr. Merrily Pew , who verbally acknowledged these results. Bilateral airspace opacities are noted most consistent with multifocal pneumonia or less likely pulmonary infarctions. Electronically Signed   By: Marijo Conception, M.D.   On: 05/22/2018 13:09    EKG: Independently reviewed.QT 495, T wave inversions in lead III, aVF, V3, V 4  Assessment/Plan Principal Problem:   Pulmonary emboli / left leg DVT - ultrasound in ED shows and indeterminate DVT in  left Popliteal vein - Heparin infusion ordered by ED- hold Asprin which she takes for prevention - ED did confer with PCCM and they did not feel the patient warrants a thrombolysis - pulse ox is 99% on room air, BP is high - ? Right heart strain on CT- check troponin and ECHO - risk factor seems to be her recent sedentary job, chronic pedal edema suggestive of venous stasis & morbid obesity - check pulse ox tomorrow to assess for O2 requirements on ambulation  Active Problems: ? Pneumnonia vs infarcts on CT - no leukocytosis or  fever, cough is nonproductive- I am not certain this is pneumonia -  she was given Levaquin in the ED - ? Is cough is related to asthma or allergies - will hold off on on further antibiotics- follow symptoms    Uncontrolled diabetes mellitus with hyperglycemia, with long-term current use of insulin  -cont Insulin doses but a lower than home doses- add SSI  Pedal edema - TEDS  HTN - cont ARB  HLD - cont Zocor    Obesity, Class III, BMI 40-49.9 (morbid obesity) Body mass index is 48.26 kg/m.    Eczema     DVT prophylaxis: Heparin infusion Code Status: Full code  Family Communication: sister  Disposition Plan: telemetry  Consults called: ED spoke with PCCM  Admission status: inpatient    Debbe Odea MD [facility] Pager: [email or web address] Password [email or web address] 7PM-7AM, please contact night-coverage   05/22/2018, 2:33 PM

## 2018-05-22 NOTE — ED Notes (Signed)
ED TO INPATIENT HANDOFF REPORT  Name/Age/Gender Sherri Dixon 37 y.o. female  Code Status    Code Status Orders  (From admission, onward)         Start     Ordered   05/22/18 1455  Full code  Continuous     05/22/18 1454        Code Status History    Date Active Date Inactive Code Status Order ID Comments User Context   06/05/2016 1613 06/06/2016 1540 Full Code [id number]  Caren Griffins, MD ED      Home/SNF/Other Home  Chief Complaint SOB; Leg/Ft Swelling  Level of Care/Admitting Diagnosis ED Disposition    ED Disposition Condition [facility] Area: [facility] [[id number]]  Level of Care: Telemetry [5]  Admit to tele based on following criteria: Other see comments  Comments: large PE, cardiac strain  Diagnosis: Pulmonary emboli [name]) [[id number]]  Admitting Physician: [place], [place]  Attending Physician: Debbe Odea [[id number]]  Estimated length of stay: past midnight tomorrow  Certification:: I certify this patient will need inpatient services for at least 2 midnights  PT Class (Do Not Modify): Inpatient [101]  PT Acc Code (Do Not Modify): Private [1]       Medical History Past Medical History:  Diagnosis Date  . Diabetes mellitus without complication ([place])   . Dyslipidemia   . GERD (gastroesophageal reflux disease)     Allergies Allergies  Allergen Reactions  . Insulin Nph Isophane & Regular Rash    Pt has this reaction to humalog & lantus when taken together   . Humalog Kwikpen [Insulin Lispro] Other (See Comments)    75/50---swelling and ulcers in mouth     IV Location/Drains/Wounds Patient Lines/Drains/Airways Status   Active Line/Drains/Airways    Name:   Placement date:   Placement time:   Site:   Days:   Peripheral IV 05/22/18 Left;Anterior   05/22/18    1042    -   less than 1   Peripheral IV 05/22/18 Left Hand   05/22/18    1310    Hand   less than 1          Labs/Imaging Results for orders placed or  performed during the hospital encounter of 05/22/18 (from the past 48 hour(s))  CBC with Differential     Status: Abnormal   Collection Time: 05/22/18 10:50 AM  Result Value Ref Range   WBC 8.9 4.0 - 10.5 K/uL   RBC 3.17 (L) 3.87 - 5.11 MIL/uL   Hemoglobin 10.0 (L) 12.0 - 15.0 g/dL   HCT 30.7 (L) 36.0 - 46.0 %   MCV 96.8 78.0 - 100.0 fL   MCH 31.5 26.0 - 34.0 pg   MCHC 32.6 30.0 - 36.0 g/dL   RDW 18.2 (H) 11.5 - 15.5 %   Platelets 303 150 - 400 K/uL   Neutrophils Relative % 71 %   Neutro Abs 6.3 1.7 - 7.7 K/uL   Lymphocytes Relative 24 %   Lymphs Abs 2.1 0.7 - 4.0 K/uL   Monocytes Relative 2 %   Monocytes Absolute 0.2 0.1 - 1.0 K/uL   Eosinophils Relative 3 %   Eosinophils Absolute 0.3 0.0 - 0.7 K/uL   Basophils Relative 0 %   Basophils Absolute 0.0 0.0 - 0.1 K/uL    Comment: Performed at [facility], [place] [address], [place], [place] [address]  Comprehensive metabolic panel     Status: Abnormal  Collection Time: 05/22/18 10:50 AM  Result Value Ref Range   Sodium 139 135 - 145 mmol/L   Potassium 4.0 3.5 - 5.1 mmol/L   Chloride 104 98 - 111 mmol/L   CO2 26 22 - 32 mmol/L   Glucose, Bld 182 (H) 70 - 99 mg/dL   BUN 15 6 - 20 mg/dL   Creatinine, Ser 0.60 0.44 - 1.00 mg/dL   Calcium 8.8 (L) 8.9 - 10.3 mg/dL   Total Protein 7.3 6.5 - 8.1 g/dL   Albumin 3.6 3.5 - 5.0 g/dL   AST 21 15 - 41 U/L   ALT 14 0 - 44 U/L   Alkaline Phosphatase 90 38 - 126 U/L   Total Bilirubin 0.5 0.3 - 1.2 mg/dL   GFR calc non Af Amer >60 >60 mL/min   GFR calc Af Amer >60 >60 mL/min    Comment: (NOTE) The eGFR has been calculated using the CKD EPI equation. This calculation has not been validated in all clinical situations. eGFR's persistently <60 mL/min signify possible Chronic Kidney Disease.    Anion gap 9 5 - 15    Comment: Performed at [facility], [place] [address] [place], [place] [address]  Brain natriuretic peptide     Status: Abnormal   Collection  Time: 05/22/18 10:50 AM  Result Value Ref Range   B Natriuretic Peptide 111.8 (H) 0.0 - 100.0 pg/mL    Comment: Performed at [facility], [place] [address], [place], [place] [address]  Troponin I     Status: None   Collection Time: 05/22/18 10:50 AM  Result Value Ref Range   Troponin I <0.03 <0.03 ng/mL    Comment: Performed at [facility], [place] [address] [place], [place] [address]   Dg Chest 2 View  Result Date: 05/22/2018 CLINICAL DATA:  Dyspnea, lower extremity swelling EXAM: CHEST - 2 VIEW COMPARISON:  06/05/2016 chest radiograph. FINDINGS: Top-normal heart size. Normal mediastinal contour. No pneumothorax. No pleural effusions. Mild patchy bilateral parahilar lung opacities, left greater than right. IMPRESSION: Mild patchy bilateral parahilar lung opacities, which could represent mild pulmonary edema or multilobar pneumonia. Recommend attention on follow-up chest radiographs. Electronically Signed   By: Ilona Sorrel M.D.   On: 05/22/2018 11:36   Ct Angio Chest Pe W And/or Wo Contrast  Result Date: 05/22/2018 CLINICAL DATA:  Dyspnea. EXAM: CT ANGIOGRAPHY CHEST WITH CONTRAST TECHNIQUE: Multidetector CT imaging of the chest was performed using the standard protocol during bolus administration of intravenous contrast. Multiplanar CT image reconstructions and MIPs were obtained to evaluate the vascular anatomy. CONTRAST:  147m ISOVUE-370 IOPAMIDOL (ISOVUE-370) INJECTION 76% COMPARISON:  Radiographs of same day. FINDINGS: Cardiovascular: Large filling defects are noted in the lower lobes of both pulmonary arteries consistent with pulmonary emboli, right greater than left. RV/LV ratio of 1.0 is noted suggesting right heart strain. Normal cardiac size. No pericardial effusion. Mediastinum/Nodes: No enlarged mediastinal, hilar, or axillary lymph nodes. Thyroid gland, trachea, and esophagus demonstrate no significant findings. Lungs/Pleura: No pneumothorax or pleural  effusion is noted. Multiple airspace opacities are noted throughout both lungs, most prominently seen in left lung apex and right upper lobe, consistent with multifocal pneumonia. Upper Abdomen: No acute abnormality. Musculoskeletal: No chest wall abnormality. No acute or significant osseous findings. Review of the MIP images confirms the above findings. IMPRESSION: Large filling defects are noted in the lower lobe branches of both pulmonary arteries consistent pulmonary emboli. Positive for acute PE with CT evidence of right  heart strain (RV/LV Ratio = 1.0) consistent with at least submassive (intermediate risk) PE. The presence of right heart strain has been associated with an increased risk of morbidity and mortality. Please activate Code PE by paging [phone]. Critical Value/emergent results were called by telephone at the time of interpretation on 05/22/2018 at 1:08 pm to Dr. Merrily Pew , who verbally acknowledged these results. Bilateral airspace opacities are noted most consistent with multifocal pneumonia or less likely pulmonary infarctions. Electronically Signed   By: Marijo Conception, M.D.   On: 05/22/2018 13:09    Pending Labs Unresulted Labs (From admission, onward)    Start     Ordered   05/23/18 0500  CBC  Daily,   R     05/22/18 1321   05/23/18 8588  Basic metabolic panel  Tomorrow morning,   R     05/22/18 1454   05/23/18 0500  CBC  Tomorrow morning,   R     05/22/18 1454   05/22/18 2000  Heparin level (unfractionated)  Once-Timed,   R     05/22/18 1321   05/22/18 1454  HIV antibody (Routine Testing)  Once,   R     05/22/18 1454   05/22/18 1451  Troponin I  Now then every 6 hours,   R     05/22/18 1450          Vitals/Pain Today's Vitals   05/22/18 1300 05/22/18 1311 05/22/18 1331 05/22/18 1440  BP: (!) 171/101  (!) 165/101 (!) 166/92  Pulse: 88  97 82  Resp: 17  19 16   Temp:    98.8 F (37.1 C)  TempSrc:    Oral  SpO2: 99%  93% 100%  Weight:  (S) 131.5 kg     Height:  (S) 5' 5"  (1.651 m)    PainSc:        Isolation Precautions No active isolations  Medications Medications  levofloxacin (LEVAQUIN) IVPB 750 mg (750 mg Intravenous New Bag/Given 05/22/18 1345)  heparin ADULT infusion 100 units/mL (25000 units/250m sodium chloride 0.45%) (1,750 Units/hr Intravenous New Bag/Given 05/22/18 1437)  insulin aspart (novoLOG) injection 6 Units (has no administration in time range)  irbesartan (AVAPRO) tablet 150 mg (has no administration in time range)  simvastatin (ZOCOR) tablet 40 mg (has no administration in time range)  sodium chloride flush (NS) 0.9 % injection 3 mL (has no administration in time range)  sodium chloride flush (NS) 0.9 % injection 3 mL (has no administration in time range)  0.9 %  sodium chloride infusion (has no administration in time range)  acetaminophen (TYLENOL) tablet 650 mg (has no administration in time range)    Or  acetaminophen (TYLENOL) suppository 650 mg (has no administration in time range)  ondansetron (ZOFRAN) tablet 4 mg (has no administration in time range)    Or  ondansetron (ZOFRAN) injection 4 mg (has no administration in time range)  iopamidol (ISOVUE-370) 76 % injection 100 mL ( Intravenous Canceled Entry 05/22/18 1309)  heparin bolus via infusion 3,000 Units (3,000 Units Intravenous Bolus from Bag 05/22/18 1440)    Mobility walks

## 2018-05-22 NOTE — ED Triage Notes (Signed)
Patient here from home with complaints of bilateral leg and ankle swelling for months. Hx of diabetes. Denies hx of CHF.  No chest pain.

## 2018-05-22 NOTE — Progress Notes (Signed)
Patient from home to ED for swelling of legs an pulmonary emboli and is admitted for further work up.   CSW met with patient at bedside to complete assessment and assess for needs related to outpatient healthcare.  Patient reports living at home with her husband and teenage step-daughter. Patient identifies her husband and sister as supports. Patient reports total independence; she can drive, care for herself, and does not require assistive mobility devices. Patient reports prior hospital visits related to diabetes complications. Patient reports last inpatient hospitalization was approximately one year prior.  Patient reports that she was recently added to her husband's insurance and states she is able to see doctors and specialists and obtain medications.   CSW briefly discussed the possibility of a SNF placement recommendation. Patient would be receptive "if it was necessary." Patient reports no SNF preferences at this time.   Stephanie Acre, [place] Social Worker [phone]

## 2018-05-22 NOTE — Clinical Social Work Note (Signed)
Clinical Social Work Assessment  Patient Details  Name: Sherri Dixon MRN: [id number] Date of Birth: 09/16/1981  Date of referral:  05/22/18               Reason for consult:  Other (Comment Required)(Patient to be admitted inpatient from ED)                Permission sought to share information with:  Case Manager Permission granted to share information::  Yes, Verbal Permission Granted  Name::        Agency::     Relationship::     Contact Information:     Housing/Transportation Living arrangements for the past 2 months:  Single Family Home Source of Information:  Patient Patient Interpreter Needed:  None Criminal Activity/Legal Involvement Pertinent to Current Situation/Hospitalization:  No - Comment as needed Significant Relationships:  Significant Other, Siblings Lives with:  Spouse, Minor Children Do you feel safe going back to the place where you live?  Yes Need for family participation in patient care:  No (Coment)  Care giving concerns:   Patient from home to ED for swelling of legs an pulmonary emboli and is admitted for further work up.   Social Worker assessment / plan:   CSW met with patient at bedside to complete assessment and assess for needs related to outpatient healthcare.  Patient reports living at home with her husband and teenage step-daughter. Patient identifies her husband and sister as supports. Patient reports total independence; she can drive, care for herself, and does not require assistive mobility devices. Patient reports prior hospital visits related to diabetes complications. Patient reports last inpatient hospitalization was approximately one year prior.   Patient resigned from her job approximately two weeks ago when swelling in her legs became very painful. Patient reports that she was recently added to her husband's insurance and states she is able to see doctors and specialists and obtain medications.   CSW briefly discussed the possibility of a  SNF placement recommendation. Patient would be receptive "if it was necessary." Patient reports no SNF preferences at this time.  Employment status:  Unemployed Insurance information:  Other (Comment Required)(Patient reports recently being added to husband's insurance ) PT Recommendations:  Not assessed at this time Information / Referral to community resources:     Patient/Family's Response to care:  Patient was pleasant and engaged with CSW. Patient hopeful for recovery and discharge once medically stable.  Patient/Family's Understanding of and Emotional Response to Diagnosis, Current Treatment, and Prognosis:   Patient aware of inpatient admission plan, no further questions for social work at this time. Patient encouraged to request CSW consult if questions arise.   Emotional Assessment Appearance:  Appears stated age Attitude/Demeanor/Rapport:  Engaged Affect (typically observed):  Accepting, Calm, Hopeful, Pleasant Orientation:  Oriented to Self, Oriented to Place, Oriented to  Time, Oriented to Situation Alcohol / Substance use:  Never Used Psych involvement (Current and /or in the community):  No (Comment)  Discharge Needs  Concerns to be addressed:  No discharge needs identified, Denies Needs/Concerns at this time Readmission within the last 30 days:  No Current discharge risk:  None Barriers to Discharge:  Continued Medical Work up   Charlotte C Hoy, LCSWA 05/22/2018, 3:04 PM  

## 2018-05-22 NOTE — ED Provider Notes (Signed)
Emergency Department Provider Note   I have reviewed the triage vital signs and the nursing notes.   HISTORY  Chief Complaint Leg Swelling and DVT   HPI Sherri Dixon is a 37 y.o. female with history of diabetes, hyperlipidemia and reflux and obesity that presents to the emergency department today secondary to multiple months of progressively worsening leg swelling.  Patient states that the swelling has not gotten that much worse necessarily but the fact that her left is bigger than her right and is not improving as much as it used to bothers her.  Also over the last couple days she has had some shortness of breath.  States it happens primarily time she stands up but deftly when she exerts herself.  This is new and has a worse she came here for evaluation.  She has no history of blood clots, recent surgeries or long car rides.  No history of cardiac disease. No other associated or modifying symptoms.    Past Medical History:  Diagnosis Date  . Diabetes mellitus without complication ([place])   . Dyslipidemia   . GERD (gastroesophageal reflux disease)     Patient Active Problem List   Diagnosis Date Noted  . Pulmonary emboli ([place]) 05/22/2018  . Obesity, Class III, BMI 40-49.9 (morbid obesity) ([place]) 05/22/2018  . Eczema 05/22/2018  . Morbid obesity ([place]) 06/06/2016  . Uncontrolled diabetes mellitus with hyperglycemia, with long-term current use of insulin ([place]) 06/06/2016  . Hypokalemia 06/06/2016  . Diabetic ketoacidosis without coma associated with type 1 diabetes mellitus ([place])   . DKA (diabetic ketoacidoses) ([place]) 06/05/2016  . GERD (gastroesophageal reflux disease)     History reviewed. No pertinent surgical history.    Allergies Insulin nph isophane & regular and Humalog kwikpen [insulin lispro]  Family History  Problem Relation Age of Onset  . High blood pressure Mother   . Diabetes Mother   . Arthritis Mother   . Hyperlipidemia Mother   . High blood pressure  Father   . Hyperlipidemia Father   . High blood pressure Sister   . Diabetes Brother   . Hyperlipidemia Brother   . High blood pressure Brother     Social History Social History   Tobacco Use  . Smoking status: Never Smoker  . Smokeless tobacco: Never Used  Substance Use Topics  . Alcohol use: No  . Drug use: No    Review of Systems  All other systems negative except as documented in the HPI. All pertinent positives and negatives as reviewed in the HPI. ____________________________________________   PHYSICAL EXAM:  VITAL SIGNS: ED Triage Vitals  Enc Vitals Group     BP 05/22/18 0956 (!) 159/93     Pulse Rate 05/22/18 0956 (!) 101     Resp 05/22/18 0956 (!) 24     Temp 05/22/18 0956 98.1 F (36.7 C)     Temp Source 05/22/18 0956 Oral     SpO2 05/22/18 0956 99 %    Constitutional: Alert and oriented. Well appearing and in no acute distress. Eyes: Conjunctivae are normal. PERRL. EOMI. Head: Atraumatic. Nose: No congestion/rhinnorhea. Mouth/Throat: Mucous membranes are moist.  Oropharynx non-erythematous. Neck: No stridor.  No meningeal signs.   Cardiovascular: tachycardic rate, regular rhythm. Good peripheral circulation. Grossly normal heart sounds.   Respiratory: tachypneic respiratory effort.  No retractions. Lungs CTAB. Gastrointestinal: Soft and nontender. No distention.  Musculoskeletal: No lower extremity tenderness. BLE 1+ pitting edema to mid-shin but left leg more swollen than right. Intact distal  pulses. No gross deformities of extremities. Neurologic:  Normal speech and language. No gross focal neurologic deficits are appreciated.  Skin:  Skin is warm, dry and intact. Eczema rash noted.   ____________________________________________   LABS (all labs ordered are listed, but only abnormal results are displayed)  Labs Reviewed  CBC WITH DIFFERENTIAL/PLATELET - Abnormal; Notable for the following components:      Result Value   RBC 3.17 (*)     Hemoglobin 10.0 (*)    HCT 30.7 (*)    RDW 18.2 (*)    All other components within normal limits  COMPREHENSIVE METABOLIC PANEL - Abnormal; Notable for the following components:   Glucose, Bld 182 (*)    Calcium 8.8 (*)    All other components within normal limits  BRAIN NATRIURETIC PEPTIDE - Abnormal; Notable for the following components:   B Natriuretic Peptide 111.8 (*)    All other components within normal limits  CBC - Abnormal; Notable for the following components:   WBC 10.8 (*)    RBC 2.80 (*)    Hemoglobin 8.7 (*)    HCT 27.1 (*)    RDW 18.1 (*)    All other components within normal limits  BASIC METABOLIC PANEL - Abnormal; Notable for the following components:   Glucose, Bld 177 (*)    Calcium 8.5 (*)    All other components within normal limits  GLUCOSE, CAPILLARY - Abnormal; Notable for the following components:   Glucose-Capillary 114 (*)    All other components within normal limits  GLUCOSE, CAPILLARY - Abnormal; Notable for the following components:   Glucose-Capillary 181 (*)    All other components within normal limits  GLUCOSE, CAPILLARY - Abnormal; Notable for the following components:   Glucose-Capillary 158 (*)    All other components within normal limits  TROPONIN I  HEPARIN LEVEL (UNFRACTIONATED)  TROPONIN I  TROPONIN I  TROPONIN I  HEPARIN LEVEL (UNFRACTIONATED)  HIV ANTIBODY (ROUTINE TESTING)   ____________________________________________  EKG   EKG Interpretation  Date/Time:  Saturday May 22 2018 10:30:16 EDT Ventricular Rate:  81 PR Interval:    QRS Duration: 99 QT Interval:  426 QTC Calculation: 495 R Axis:   56 Text Interpretation:  Sinus rhythm Borderline T abnormalities, anterior leads Borderline prolonged QT interval No old tracing to compare Confirmed by Merrily Pew [id number]) on 05/22/2018 10:33:51 AM       ____________________________________________  RADIOLOGY  Dg Chest 2 View  Result Date: 05/22/2018 CLINICAL DATA:   Dyspnea, lower extremity swelling EXAM: CHEST - 2 VIEW COMPARISON:  06/05/2016 chest radiograph. FINDINGS: Top-normal heart size. Normal mediastinal contour. No pneumothorax. No pleural effusions. Mild patchy bilateral parahilar lung opacities, left greater than right. IMPRESSION: Mild patchy bilateral parahilar lung opacities, which could represent mild pulmonary edema or multilobar pneumonia. Recommend attention on follow-up chest radiographs. Electronically Signed   By: Ilona Sorrel M.D.   On: 05/22/2018 11:36   Ct Angio Chest Pe W And/or Wo Contrast  Result Date: 05/22/2018 CLINICAL DATA:  Dyspnea. EXAM: CT ANGIOGRAPHY CHEST WITH CONTRAST TECHNIQUE: Multidetector CT imaging of the chest was performed using the standard protocol during bolus administration of intravenous contrast. Multiplanar CT image reconstructions and MIPs were obtained to evaluate the vascular anatomy. CONTRAST:  123mL ISOVUE-370 IOPAMIDOL (ISOVUE-370) INJECTION 76% COMPARISON:  Radiographs of same day. FINDINGS: Cardiovascular: Large filling defects are noted in the lower lobes of both pulmonary arteries consistent with pulmonary emboli, right greater than left. RV/LV ratio of 1.0 is noted  suggesting right heart strain. Normal cardiac size. No pericardial effusion. Mediastinum/Nodes: No enlarged mediastinal, hilar, or axillary lymph nodes. Thyroid gland, trachea, and esophagus demonstrate no significant findings. Lungs/Pleura: No pneumothorax or pleural effusion is noted. Multiple airspace opacities are noted throughout both lungs, most prominently seen in left lung apex and right upper lobe, consistent with multifocal pneumonia. Upper Abdomen: No acute abnormality. Musculoskeletal: No chest wall abnormality. No acute or significant osseous findings. Review of the MIP images confirms the above findings. IMPRESSION: Large filling defects are noted in the lower lobe branches of both pulmonary arteries consistent pulmonary emboli. Positive  for acute PE with CT evidence of right heart strain (RV/LV Ratio = 1.0) consistent with at least submassive (intermediate risk) PE. The presence of right heart strain has been associated with an increased risk of morbidity and mortality. Please activate Code PE by paging [phone]. Critical Value/emergent results were called by telephone at the time of interpretation on 05/22/2018 at 1:08 pm to Dr. Merrily Pew , who verbally acknowledged these results. Bilateral airspace opacities are noted most consistent with multifocal pneumonia or less likely pulmonary infarctions. Electronically Signed   By: Marijo Conception, M.D.   On: 05/22/2018 13:09    ____________________________________________   PROCEDURES  Procedure(s) performed:   Procedures  CRITICAL CARE Performed by: Merrily Pew Total critical care time: 35 minutes Critical care time was exclusive of separately billable procedures and treating other patients. Critical care was necessary to treat or prevent imminent or life-threatening deterioration. Critical care was time spent personally by me on the following activities: development of treatment plan with patient and/or surrogate as well as nursing, discussions with consultants, evaluation of patient's response to treatment, examination of patient, obtaining history from patient or surrogate, ordering and performing treatments and interventions, ordering and review of laboratory studies, ordering and review of radiographic studies, pulse oximetry and re-evaluation of patient's condition.  ____________________________________________   INITIAL IMPRESSION / ASSESSMENT AND PLAN / ED COURSE  Venous insufficiency vs DVT vs unstable angina vs PE. eval for same.   Patient's DVT study and CT scan were both positive for acute DVT and PE respectively.  I discussed the case with critical care, Dr. Charmian Muff, who did not feel intervention was indicated at this time and the patient was stable  for admission to the stepdown at [facility].  Discussed case with triad hospitalist who will admit for the same.  Heparin started.     Pertinent labs & imaging results that were available during my care of the patient were reviewed by me and considered in my medical decision making (see chart for details).  ____________________________________________  FINAL CLINICAL IMPRESSION(S) / ED DIAGNOSES  Final diagnoses:  Peripheral edema  Acute deep vein thrombosis (DVT) of proximal vein of left lower extremity (HCC)  Acute pulmonary embolism without acute cor pulmonale, unspecified pulmonary embolism type ([place])     MEDICATIONS GIVEN DURING THIS VISIT:  Medications  heparin ADULT infusion 100 units/mL (25000 units/259mL sodium chloride 0.45%) (1,750 Units/hr Intravenous New Bag/Given 05/23/18 0316)  insulin aspart (novoLOG) injection 6 Units (6 Units Subcutaneous Not Given 05/22/18 1823)  irbesartan (AVAPRO) tablet 150 mg (has no administration in time range)  simvastatin (ZOCOR) tablet 40 mg (40 mg Oral Given 05/22/18 2107)  sodium chloride flush (NS) 0.9 % injection 3 mL (3 mLs Intravenous Given 05/22/18 2108)  sodium chloride flush (NS) 0.9 % injection 3 mL (has no administration in time range)  0.9 %  sodium chloride infusion (has  no administration in time range)  acetaminophen (TYLENOL) tablet 650 mg (has no administration in time range)    Or  acetaminophen (TYLENOL) suppository 650 mg (has no administration in time range)  ondansetron (ZOFRAN) tablet 4 mg (has no administration in time range)    Or  ondansetron (ZOFRAN) injection 4 mg (has no administration in time range)  ibuprofen (ADVIL,MOTRIN) tablet 400 mg (400 mg Oral Given 05/22/18 1923)  iopamidol (ISOVUE-370) 76 % injection 100 mL ( Intravenous Canceled Entry 05/22/18 1309)  levofloxacin (LEVAQUIN) IVPB 750 mg (0 mg Intravenous Stopped 05/22/18 1548)  heparin bolus via infusion 3,000 Units (3,000 Units Intravenous Bolus from  Bag 05/22/18 1440)  gabapentin (NEURONTIN) capsule 300 mg (300 mg Oral Given 05/23/18 0347)     NEW OUTPATIENT MEDICATIONS STARTED DURING THIS VISIT:  Current Discharge Medication List      Note:  This note was prepared with assistance of Dragon voice recognition software. Occasional wrong-word or sound-a-like substitutions may have occurred due to the inherent limitations of voice recognition software.  Del Wiseman, Corene Cornea, MD 05/23/18 [phone]

## 2018-05-22 NOTE — Progress Notes (Addendum)
ANTICOAGULATION CONSULT NOTE  Pharmacy Consult for heparin Indication: acute pulmonary embolus  Allergies  Allergen Reactions  . Insulin Nph Isophane & Regular Rash    Pt has this reaction to humalog & lantus when taken together   . Humalog Kwikpen [Insulin Lispro] Other (See Comments)    75/50---swelling and ulcers in mouth     Patient Measurements: weight 131 kg, height 65 inches   Heparin Dosing Weight: 89 kg  Vital Signs: Temp: 98.1 F (36.7 C) (08/24 0956) Temp Source: Oral (08/24 0956) BP: 159/93 (08/24 0956) Pulse Rate: 101 (08/24 0956)  Labs: Recent Labs    05/22/18 1050  HGB 10.0*  HCT 30.7*  PLT 303  CREATININE 0.60  TROPONINI <0.03    CrCl cannot be calculated (Unknown ideal weight.).   Assessment: Patient's a 37 y.o F presented to the ED on 05/22/18 with c/o bilateral leg and ankle swelling. Left  LE doppler showed "age indeterminate deep vein thrombosis involving the popliteal vein of the left lower extremity."  Chest CTA showed acute bilateral PE (at least submassive) with evidence of right heart strain.  To start heparin for acute PE.   Goal of Therapy:  Heparin level 0.3-0.7 units/ml Monitor platelets by anticoagulation protocol: Yes   Plan:  - heparin 3000 units IV bolus, then 1750 units/hr (per [place] calc) - check 6 hr heparin level - monitor for s/s bleeding  Shivaun Bilello P 05/22/2018,1:00 PM  ________________________________ Adden (05/22/18 at 2022): First heparin level now back therapeutic at 0.51.  Continue with current rate of 1750 units/hr.  Recheck another 6 hr level at 0200 to confirm before changing to daily heparin level monitoring.  Dia Sitter, PharmD, BCPS 05/22/2018 8:24 PM

## 2018-05-23 ENCOUNTER — Inpatient Hospital Stay (HOSPITAL_COMMUNITY): Payer: Self-pay

## 2018-05-23 DIAGNOSIS — R0602 Shortness of breath: Secondary | ICD-10-CM

## 2018-05-23 LAB — ECHOCARDIOGRAM COMPLETE
Height: 65 in
WEIGHTICAEL: 4640 [oz_av]

## 2018-05-23 LAB — CBC
HEMATOCRIT: 27.1 % — AB (ref 36.0–46.0)
Hemoglobin: 8.7 g/dL — ABNORMAL LOW (ref 12.0–15.0)
MCH: 31.1 pg (ref 26.0–34.0)
MCHC: 32.1 g/dL (ref 30.0–36.0)
MCV: 96.8 fL (ref 78.0–100.0)
PLATELETS: 281 10*3/uL (ref 150–400)
RBC: 2.8 MIL/uL — AB (ref 3.87–5.11)
RDW: 18.1 % — ABNORMAL HIGH (ref 11.5–15.5)
WBC: 10.8 10*3/uL — AB (ref 4.0–10.5)

## 2018-05-23 LAB — BASIC METABOLIC PANEL
ANION GAP: 9 (ref 5–15)
BUN: 13 mg/dL (ref 6–20)
CO2: 26 mmol/L (ref 22–32)
Calcium: 8.5 mg/dL — ABNORMAL LOW (ref 8.9–10.3)
Chloride: 105 mmol/L (ref 98–111)
Creatinine, Ser: 0.63 mg/dL (ref 0.44–1.00)
GLUCOSE: 177 mg/dL — AB (ref 70–99)
POTASSIUM: 3.7 mmol/L (ref 3.5–5.1)
Sodium: 140 mmol/L (ref 135–145)

## 2018-05-23 LAB — TROPONIN I: Troponin I: <0.03 ng/mL (ref <0.03)

## 2018-05-23 LAB — GLUCOSE, CAPILLARY
GLUCOSE-CAPILLARY: 158 mg/dL — AB (ref 70–99)
GLUCOSE-CAPILLARY: 161 mg/dL — AB (ref 70–99)
Glucose-Capillary: 200 mg/dL — ABNORMAL HIGH (ref 70–99)
Glucose-Capillary: 247 mg/dL — ABNORMAL HIGH (ref 70–99)

## 2018-05-23 LAB — HEPARIN LEVEL (UNFRACTIONATED): Heparin Unfractionated: 0.5 IU/mL (ref 0.30–0.70)

## 2018-05-23 LAB — HIV ANTIBODY (ROUTINE TESTING W REFLEX): HIV SCREEN 4TH GENERATION: NONREACTIVE

## 2018-05-23 MED ORDER — INSULIN ASPART 100 UNIT/ML ~~LOC~~ SOLN
0.0000 [IU] | Freq: Three times a day (TID) | SUBCUTANEOUS | Status: DC
  Administered 2018-05-23: 2 [IU] via SUBCUTANEOUS
  Administered 2018-05-24: 3 [IU] via SUBCUTANEOUS
  Administered 2018-05-24: 5 [IU] via SUBCUTANEOUS

## 2018-05-23 MED ORDER — GABAPENTIN 300 MG PO CAPS
300.0000 mg | ORAL_CAPSULE | Freq: Once | ORAL | Status: AC
  Administered 2018-05-23: 300 mg via ORAL
  Filled 2018-05-23: qty 1

## 2018-05-23 MED ORDER — INSULIN GLARGINE 100 UNIT/ML ~~LOC~~ SOLN
10.0000 [IU] | Freq: Every day | SUBCUTANEOUS | Status: DC
  Administered 2018-05-23 – 2018-05-24 (×2): 10 [IU] via SUBCUTANEOUS
  Filled 2018-05-23 (×2): qty 0.1

## 2018-05-23 NOTE — Progress Notes (Signed)
  Echocardiogram 2D Echocardiogram has been performed.  Janashia G Willette Mudry 05/23/2018, 8:50 AM

## 2018-05-23 NOTE — Progress Notes (Addendum)
ANTICOAGULATION CONSULT NOTE  Pharmacy Consult for heparin Indication: acute pulmonary embolus  Allergies  Allergen Reactions  . Insulin Nph Isophane & Regular Rash    Pt has this reaction to humalog & lantus when taken together   . Humalog Kwikpen [Insulin Lispro] Other (See Comments)    75/50---swelling and ulcers in mouth     Patient Measurements: weight 131 kg, height 65 inches Height: (S) 5\' 5"  (165.1 cm) Weight: (S) 290 lb (131.5 kg) IBW/kg (Calculated) : 57 Heparin Dosing Weight: 89 kg  Vital Signs: Temp: 99.2 F (37.3 C) (08/24 2053) Temp Source: Oral (08/24 2053) BP: 154/103 (08/24 2053) Pulse Rate: 89 (08/24 2053)  Labs: Recent Labs    05/22/18 1050 05/22/18 1659 05/22/18 1945 05/22/18 2329 05/23/18 0145  HGB 10.0*  --   --   --  8.7*  HCT 30.7*  --   --   --  27.1*  PLT 303  --   --   --  281  HEPARINUNFRC  --   --  0.51  --  0.50  CREATININE 0.60  --   --   --  0.63  TROPONINI <0.03 <0.03  --  <0.03 <0.03    Estimated Creatinine Clearance: 129.4 mL/min (by C-G formula based on SCr of 0.63 mg/dL).   Assessment: Patient's a 37 y.o F presented to the ED on 05/22/18 with c/o bilateral leg and ankle swelling. Left  LE doppler showed "age indeterminate deep vein thrombosis involving the popliteal vein of the left lower extremity."  Chest CTA showed acute bilateral PE (at least submassive) with evidence of right heart strain.  To start heparin for acute PE. Today, 8/25 0145 HL= 0.50 at goal, no bleeding or infusion issues per RN. Hgb 10 >8.7   Goal of Therapy:  Heparin level 0.3-0.7 units/ml Monitor platelets by anticoagulation protocol: Yes   Plan:  - continue heparin drip at 1750 units/hr -Daily CBC/HL - monitor for s/s bleeding  Lawana Pai R 05/23/2018,3:38 AM

## 2018-05-23 NOTE — Progress Notes (Addendum)
PROGRESS NOTE    Sherri Dixon  IPJ:[id number] DOB: [birth year] DOA: 05/22/2018 PCP: Jonathon Jordan, MD   Brief Narrative: Sherri Dixon is a 37 y.o. DM, HTN, HLD, obesity. She presented with dyspnea and found to have a submassive PE and left leg DVT. She was started on heparin. Evidence of heart strain on CT with none on Transthoracic Echocardiogram.   Assessment & Plan:   Principal Problem:   Pulmonary emboli (HCC) Active Problems:   GERD (gastroesophageal reflux disease)   Morbid obesity ([place])   Uncontrolled diabetes mellitus with hyperglycemia, with long-term current use of insulin (HCC)   Obesity, Class III, BMI 40-49.9 (morbid obesity) ([place])   Eczema   Submassive PE Left leg DVT Patient with no hypoxia. Chest pain and dyspnea resolved. -Continue Heparin -Discussed transition to oral therapy. Will likely transition to [place] consult for anticoagulation therapy  Pneumonia vs infarct Likely infarcts. Pulmonology consulted prior to admission. Given Levaquin on admission. Unlikely infection. -Continue to watch symptoms  Diabetes mellitus Insulin dependent. On degludec 60-68 units as an outpatient. Fairly controlled for hospital setting. -Continue aspart 6 units TID w/meals -Start SSI -Start Lantus 10 units daily  Essential hypertension Continue losartan  Hyperlipidemia -Continue Simvastatin  Obesity Body mass index is 48.26 kg/m.   DVT prophylaxis: Heparin drip Code Status:   Code Status: Full Code Family Communication: None at bedside Disposition Plan: Discharge in 24-48 hours   Consultants:   PCCM by EDP  Procedures:   8/25: Transthoracic Echocardiogram  Study Conclusions  - Left ventricle: The cavity size was normal. Wall thickness was   increased in a pattern of moderate LVH. Systolic function was   vigorous. The estimated ejection fraction was in the range of 65%   to 70%. Diastolic function is abnormal, indeterminant grade. Wall   motion was normal; there were no regional wall motion   abnormalities. - Aortic valve: Valve area (VTI): 2.47 cm^2. Valve area (Vmax):   2.86 cm^2.  Antimicrobials:  None    Subjective: No dyspnea or chest pain.  Objective: Vitals:   05/22/18 1552 05/22/18 1615 05/22/18 2053 05/23/18 0429  BP: (!) 143/132 (!) 161/99 (!) 154/103 135/82  Pulse: 85 84 89 85  Resp: 14 16 18 16   Temp:  98.3 F (36.8 C) 99.2 F (37.3 C) 98.9 F (37.2 C)  TempSrc:  Oral Oral Oral  SpO2: 100% 97% 97% (!) 89%  Weight:      Height:        Intake/Output Summary (Last 24 hours) at 05/23/2018 1146 Last data filed at 05/23/2018 0600 Gross per 24 hour  Intake 297.8 ml  Output -  Net 297.8 ml   Filed Weights   05/22/18 1311  Weight: (S) 131.5 kg    Examination:  General exam: Appears calm and comfortable Respiratory system: Clear to auscultation. Respiratory effort normal. Cardiovascular system: S1 & S2 heard, RRR. No murmurs, rubs, gallops or clicks. Gastrointestinal system: Abdomen is nondistended, soft and nontender. No organomegaly or masses felt. Normal bowel sounds heard. Central nervous system: Alert and oriented. No focal neurological deficits. Extremities: LLE edema. No calf tenderness Skin: No cyanosis. Multiple hypopigmented patches Psychiatry: Judgement and insight appear normal. Mood & affect appropriate.     Data Reviewed: I have personally reviewed following labs and imaging studies  CBC: Recent Labs  Lab 05/22/18 1050 05/23/18 0145  WBC 8.9 10.8*  NEUTROABS 6.3  --   HGB 10.0* 8.7*  HCT 30.7* 27.1*  MCV 96.8 96.8  PLT 303  829   Basic Metabolic Panel: Recent Labs  Lab 05/22/18 1050 05/23/18 0145  NA 139 140  K 4.0 3.7  CL 104 105  CO2 26 26  GLUCOSE 182* 177*  BUN 15 13  CREATININE 0.60 0.63  CALCIUM 8.8* 8.5*   GFR: Estimated Creatinine Clearance: 129.4 mL/min (by C-G formula based on SCr of 0.63 mg/dL). Liver Function Tests: Recent Labs  Lab  05/22/18 1050  AST 21  ALT 14  ALKPHOS 90  BILITOT 0.5  PROT 7.3  ALBUMIN 3.6   No results for input(s): LIPASE, AMYLASE in the last 168 hours. No results for input(s): AMMONIA in the last 168 hours. Coagulation Profile: No results for input(s): INR, PROTIME in the last 168 hours. Cardiac Enzymes: Recent Labs  Lab 05/22/18 1050 05/22/18 1659 05/22/18 2329 05/23/18 0145  TROPONINI <0.03 <0.03 <0.03 <0.03   BNP (last 3 results) No results for input(s): PROBNP in the last 8760 hours. HbA1C: No results for input(s): HGBA1C in the last 72 hours. CBG: Recent Labs  Lab 05/22/18 1816 05/22/18 2052 05/23/18 0736 05/23/18 1139  GLUCAP 114* 181* 158* 161*   Lipid Profile: No results for input(s): CHOL, HDL, LDLCALC, TRIG, CHOLHDL, LDLDIRECT in the last 72 hours. Thyroid Function Tests: No results for input(s): TSH, T4TOTAL, FREET4, T3FREE, THYROIDAB in the last 72 hours. Anemia Panel: No results for input(s): VITAMINB12, FOLATE, FERRITIN, TIBC, IRON, RETICCTPCT in the last 72 hours. Sepsis Labs: No results for input(s): PROCALCITON, LATICACIDVEN in the last 168 hours.  No results found for this or any previous visit (from the past 240 hour(s)).       Radiology Studies: Dg Chest 2 View  Result Date: 05/22/2018 CLINICAL DATA:  Dyspnea, lower extremity swelling EXAM: CHEST - 2 VIEW COMPARISON:  06/05/2016 chest radiograph. FINDINGS: Top-normal heart size. Normal mediastinal contour. No pneumothorax. No pleural effusions. Mild patchy bilateral parahilar lung opacities, left greater than right. IMPRESSION: Mild patchy bilateral parahilar lung opacities, which could represent mild pulmonary edema or multilobar pneumonia. Recommend attention on follow-up chest radiographs. Electronically Signed   By: Ilona Sorrel M.D.   On: 05/22/2018 11:36   Ct Angio Chest Pe W And/or Wo Contrast  Result Date: 05/22/2018 CLINICAL DATA:  Dyspnea. EXAM: CT ANGIOGRAPHY CHEST WITH CONTRAST  TECHNIQUE: Multidetector CT imaging of the chest was performed using the standard protocol during bolus administration of intravenous contrast. Multiplanar CT image reconstructions and MIPs were obtained to evaluate the vascular anatomy. CONTRAST:  171mL ISOVUE-370 IOPAMIDOL (ISOVUE-370) INJECTION 76% COMPARISON:  Radiographs of same day. FINDINGS: Cardiovascular: Large filling defects are noted in the lower lobes of both pulmonary arteries consistent with pulmonary emboli, right greater than left. RV/LV ratio of 1.0 is noted suggesting right heart strain. Normal cardiac size. No pericardial effusion. Mediastinum/Nodes: No enlarged mediastinal, hilar, or axillary lymph nodes. Thyroid gland, trachea, and esophagus demonstrate no significant findings. Lungs/Pleura: No pneumothorax or pleural effusion is noted. Multiple airspace opacities are noted throughout both lungs, most prominently seen in left lung apex and right upper lobe, consistent with multifocal pneumonia. Upper Abdomen: No acute abnormality. Musculoskeletal: No chest wall abnormality. No acute or significant osseous findings. Review of the MIP images confirms the above findings. IMPRESSION: Large filling defects are noted in the lower lobe branches of both pulmonary arteries consistent pulmonary emboli. Positive for acute PE with CT evidence of right heart strain (RV/LV Ratio = 1.0) consistent with at least submassive (intermediate risk) PE. The presence of right heart strain has been associated with  an increased risk of morbidity and mortality. Please activate Code PE by paging [phone]. Critical Value/emergent results were called by telephone at the time of interpretation on 05/22/2018 at 1:08 pm to Dr. Merrily Pew , who verbally acknowledged these results. Bilateral airspace opacities are noted most consistent with multifocal pneumonia or less likely pulmonary infarctions. Electronically Signed   By: Marijo Conception, M.D.   On: 05/22/2018 13:09         Scheduled Meds: . insulin aspart  6 Units Subcutaneous TID WC  . irbesartan  150 mg Oral Daily  . simvastatin  40 mg Oral QHS  . sodium chloride flush  3 mL Intravenous Q12H   Continuous Infusions: . sodium chloride    . heparin 1,750 Units/hr (05/23/18 0316)     LOS: 1 day     Cordelia Poche, MD [facility] 05/23/2018, 11:46 AM Pager: [phone]  If 7PM-7AM, please contact night-coverage [email or web address] 05/23/2018, 11:46 AM

## 2018-05-23 NOTE — Progress Notes (Signed)
Pt reported having bright red blood in her urine. Writer nor NT saw the urine. MD notified. Will continue to monitor.

## 2018-05-24 DIAGNOSIS — Z794 Long term (current) use of insulin: Secondary | ICD-10-CM

## 2018-05-24 DIAGNOSIS — K219 Gastro-esophageal reflux disease without esophagitis: Secondary | ICD-10-CM

## 2018-05-24 DIAGNOSIS — E1165 Type 2 diabetes mellitus with hyperglycemia: Secondary | ICD-10-CM

## 2018-05-24 DIAGNOSIS — I2699 Other pulmonary embolism without acute cor pulmonale: Secondary | ICD-10-CM

## 2018-05-24 LAB — URINALYSIS, ROUTINE W REFLEX MICROSCOPIC
Glucose, UA: NEGATIVE mg/dL
KETONES UR: NEGATIVE mg/dL
LEUKOCYTES UA: NEGATIVE
Nitrite: NEGATIVE
PH: 5.5 (ref 5.0–8.0)
Protein, ur: 30 mg/dL — AB

## 2018-05-24 LAB — CBC
HEMATOCRIT: 28.3 % — AB (ref 36.0–46.0)
Hemoglobin: 9 g/dL — ABNORMAL LOW (ref 12.0–15.0)
MCH: 31 pg (ref 26.0–34.0)
MCHC: 31.8 g/dL (ref 30.0–36.0)
MCV: 97.6 fL (ref 78.0–100.0)
PLATELETS: 303 10*3/uL (ref 150–400)
RBC: 2.9 MIL/uL — AB (ref 3.87–5.11)
RDW: 18.5 % — ABNORMAL HIGH (ref 11.5–15.5)
WBC: 9.5 10*3/uL (ref 4.0–10.5)

## 2018-05-24 LAB — URINALYSIS, MICROSCOPIC (REFLEX)

## 2018-05-24 LAB — HEPARIN LEVEL (UNFRACTIONATED): Heparin Unfractionated: 0.66 IU/mL (ref 0.30–0.70)

## 2018-05-24 LAB — GLUCOSE, CAPILLARY
GLUCOSE-CAPILLARY: 218 mg/dL — AB (ref 70–99)
Glucose-Capillary: 261 mg/dL — ABNORMAL HIGH (ref 70–99)

## 2018-05-24 MED ORDER — APIXABAN 5 MG PO TABS: 5.0000 mg | ORAL_TABLET | Freq: Two times a day (BID) | ORAL | Status: DC

## 2018-05-24 MED ORDER — APIXABAN 5 MG PO TABS: ORAL_TABLET | ORAL | 0 refills | Status: DC

## 2018-05-24 MED ORDER — APIXABAN (ELIQUIS) EDUCATION KIT FOR DVT/PE PATIENTS
PACK | Freq: Once | Status: DC
  Filled 2018-05-24: qty 1

## 2018-05-24 MED ORDER — APIXABAN 5 MG PO TABS
10.0000 mg | ORAL_TABLET | Freq: Two times a day (BID) | ORAL | Status: DC
  Administered 2018-05-24: 10 mg via ORAL
  Filled 2018-05-24: qty 2

## 2018-05-24 NOTE — Discharge Instructions (Addendum)
Pulmonary Embolism A pulmonary embolism (PE) is a sudden blockage or decrease of blood flow in one lung or both lungs. Most blockages come from a blood clot that forms in a lower leg, thigh, or arm vein (deep vein thrombosis, DVT) and travels to the lungs. A clot is blood that has thickened into a gel or solid. PE is a dangerous and life-threatening condition that needs to be treated right away. What are the causes? This condition is usually caused by a blood clot that forms in a vein and moves to the lungs. In rare cases, it may be caused by air, fat, part of a tumor, or other tissue that moves through the veins and into the lungs. What increases the risk? The following factors may make you more likely to develop this condition:  Having DVT or a history of DVT.  Being older than age 39.  Personal or family history of blood clots or blood clotting disease.  Major or lengthy surgery.  Orthopedic surgery, especially hip or knee replacement.  Traumatic injury, such as breaking a hip or leg.  Spinal cord injury.  Stroke.  Taking medicines that contain estrogen. These include birth control pills and hormone replacement therapy.  Long-term (chronic) lung or heart disease.  Cancer and chemotherapy.  Having a central venous catheter.  Pregnancy and the period after delivery.  What are the signs or symptoms? Symptoms of this condition usually start suddenly and include:  Shortness of breath while active or at rest.  Coughing or coughing up blood or blood-tinged mucus.  Chest pain that is often worse with deep breaths.  Rapid or irregular heartbeat.  Feeling light-headed or dizzy.  Fainting.  Feeling anxious.  Sweating.  Pain and swelling in a leg. This is a symptom of DVT, which can lead to PE.  How is this diagnosed? This condition may be diagnosed based on:  Your medical history.  A physical exam.  Blood tests to check blood oxygen level and how well your blood  clots, and a D-dimer blood test, which checks your blood for a substance that is released when a blood clot breaks apart.  CT pulmonary angiogram. This test checks blood flow in and around your lungs.  Ventilation-perfusion scan, also called a lung VQ scan. This test measures air flow and blood flow to the lungs.  Ultrasound of the legs to look for blood clots.  How is this treated? Treatment for this conditions depends on many factors, such as the cause of your PE, your risk for bleeding or developing more clots, and other medical conditions you have. Treatment aims to remove, dissolve, or stop blood clots from forming or growing larger. Treatment may include:  Blood thinning medicines (anticoagulants) to stop clots from forming or growing. These medicines may be given as a pill, as an injection, or through an IV tube (infusion).  Medicines that dissolve clots (thrombolytics).  A procedure in which a flexible tube is used to remove a blood clot (embolectomy) or deliver medicine to destroy it (catheter-directed thrombolysis).  A procedure in which a filter is inserted into a large vein that carries blood to the heart (inferior vena cava). This filter (vena cava filter) catches blood clots before they reach the lungs.  Surgery to remove the clot (surgical embolectomy). This is rare.  You may need a combination of immediate, long-term (up to 3 months after diagnosis), and extended (more than 3 months after diagnosis) treatments. Your treatment may continue for several months (maintenance therapy).   You and your health care provider will work together to choose the treatment program that is best for you. Follow these instructions at home: If you are taking an anticoagulant medicine:  Take the medicine every day at the same time each day.  Understand what foods and drugs interact with your medicine.  Understand the side effects of this medicine, including excessive bruising or bleeding. Ask  your health care provider or pharmacist about other side effects. General instructions  Take over-the-counter and prescription medicines only as told by your health care provider.  Anticoagulant medicines may cause side effects, including easy bruising and difficulty stopping bleeding. If you are prescribed an anticoagulant: ? Hold pressure over cuts for longer than usual. ? Tell your dentist and other health care providers that you are taking anticoagulants before you have any procedure that may cause bleeding. ? Avoid contact sports. ? Be extra careful when handling sharp objects. ? Use a soft toothbrush. Floss with waxed dental floss. ? Shave with an electric razor.  Wear a medical alert bracelet or carry a medical alert card that says you have had a PE.  Ask your health care provider when you may return to your normal activities.  Talk with your health care provider about any travel plans. It is important to make sure that you are still able to take your medicine while on trips.  Keep all follow-up visits as told by your health care provider. This is important. How is this prevented? Take these actions to lower your risk of developing another PE:  Exercise regularly. Take frequent walks. For at least 30 minutes every day, engage in: ? Activity that involves moving your arms and legs. ? Activity that encourages good blood flow through your body by increasing your heart rate.  While traveling, drink plenty of water and avoid drinking alcohol. Ask your health care provider if you should wear below-the-knee compression stockings.  Avoid sitting or lying in bed for long periods of time without moving your legs. Exercise your arms and legs every hour during long-distance travel (over 4 hours).  If you are hospitalized or have surgery, ask your health care provider about your risks and what treatments can help prevent blood clots.  Maintain a healthy weight. Ask your health care  provider what weight is healthy for you.  If you are a woman who is over age 39, avoid unnecessary use of medicines that contain estrogen, including birth control pills.  Do not use any products that contain nicotine or tobacco, such as cigarettes and e-cigarettes. This is especially important if you take estrogen medicines. If you need help quitting, ask your health care provider.  See your health care provider for regular checkups. This may include blood tests and ultrasound testing on your legs to check for new blood clots.  Contact a health care provider if:  You missed a dose of your blood thinner medicine. Get help right away if:  You have new or increased pain, swelling, warmth, or redness in an arm or leg.  You have numbness or tingling in an arm or leg.  You have shortness of breath while active or at rest.  You have chest pain.  You have a rapid or irregular heartbeat.  You feel light-headed or dizzy.  You cough up blood.  You have blood in your vomit, stool, or urine.  You have a fever.  You have abdomen (abdominal) pain.  You have a severe fall or head injury.  You have a   severe headache.  You have vision changes.  You cannot move your arms or legs.  You are confused or have memory loss.  You are bleeding for 10 minutes or more, even with strong pressure on the wound. These symptoms may represent a serious problem that is an emergency. Do not wait to see if the symptoms will go away. Get medical help right away. Call your local emergency services (911 in the [place] Do not drive yourself to the hospital. Summary  A pulmonary embolism (PE) is a sudden blockage or decrease of blood flow in one lung or both lungs. PE is a dangerous and life-threatening condition that needs to be treated right away.  Having deep vein thrombosis (DVT) or a history of DVT is the most common risk factor for PE.  Treatments for this condition usually include medicines to thin  your blood (anticoagulants) or medicines to break apart blood clots (thrombolytics).  If you are prescribed blood thinners, it is important to take the medicine every single day at the same time each day.  If you have signs of PE or DVT, call your local emergency services (911 in the [place] This information is not intended to replace advice given to you by your health care provider. Make sure you discuss any questions you have with your health care provider. Document Released: 09/12/2000 Document Revised: 10/18/2016 Document Reviewed: 10/18/2016 Elsevier Interactive Patient Education  2018 [place] on my medicine - ELIQUIS (apixaban)  Why was Eliquis prescribed for you? Eliquis was prescribed to treat blood clots that may have been found in the veins of your legs (deep vein thrombosis) or in your lungs (pulmonary embolism) and to reduce the risk of them occurring again.  What do You need to know about Eliquis ? The starting dose is 10 mg (two 5 mg tablets) taken TWICE daily for the FIRST SEVEN (7) DAYS, then on DAY 8 (05/31/18), the dose is reduced to ONE 5 mg tablet taken TWICE daily.  Eliquis may be taken with or without food.   Try to take the dose about the same time in the morning and in the evening. If you have difficulty swallowing the tablet whole please discuss with your pharmacist how to take the medication safely.  Take Eliquis exactly as prescribed and DO NOT stop taking Eliquis without talking to the doctor who prescribed the medication.  Stopping may increase your risk of developing a new blood clot.  Refill your prescription before you run out.  After discharge, you should have regular check-up appointments with your healthcare provider that is prescribing your Eliquis.    What do you do if you miss a dose? If a dose of ELIQUIS is not taken at the scheduled time, take it as soon as possible on the same day and twice-daily administration should  be resumed. The dose should not be doubled to make up for a missed dose.  Important Safety Information A possible side effect of Eliquis is bleeding. You should call your healthcare provider right away if you experience any of the following: ? Bleeding from an injury or your nose that does not stop. ? Unusual colored urine (red or dark brown) or unusual colored stools (red or black). ? Unusual bruising for unknown reasons. ? A serious fall or if you hit your head (even if there is no bleeding).  Some medicines may interact with Eliquis and might increase your risk of bleeding or clotting while on Eliquis.  To help avoid this, consult your healthcare provider or pharmacist prior to using any new prescription or non-prescription medications, including herbals, vitamins, non-steroidal anti-inflammatory drugs (NSAIDs) and supplements.  This website has more information on Eliquis (apixaban): http://www.eliquis.com/eliquis/home

## 2018-05-24 NOTE — Discharge Summary (Signed)
Physician Discharge Summary  Sherri Dixon KNL:[id number] DOB: 1981-07-26 DOA: 05/22/2018  PCP: Jonathon Jordan, MD  Admit date: 05/22/2018 Discharge date: 05/24/2018  Admitted From: Home Disposition: Home  Recommendations for Outpatient Follow-up:  1. Follow up with PCP in 1 week 2. Aspirin discontinued while patient on anticoagulation 3. Please obtain BMP/CBC in one week 4. Please follow up on the following pending results: None  Home Health: None Equipment/Devices: None  Discharge Condition: Stable CODE STATUS: Full code Diet recommendation: Heart healthy/carb modified   Brief/Interim Summary:  Admission HPI written by Debbe Odea, MD   Chief Complaint: shortness of breath  HPI: Sherri Dixon is a 37 y.o. female with medical history of DM, HTN, HLD, obesity who presents for shortness of breath with ambulating. She noticed it about 2 days ago. She has a cough which is nonproductive but it has been going on for weeks. She thinks it due to dusty carpen.  She also has had swelling of her legs, L> R. Her legs have been swollen for months but her left is worse for about 2 wks now. She feels pain in her legs. She is found to have pulmonary emboli and is admitted for further work up.  She does not smoke, vape or use Oral contraceptive. 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 She left the job about 2 wks ago around the time her left leg began to swell. No h/o blood clots in her family.   ED Course: started on a Heparin infusion    Hospital course:  Submassive PE Left leg DVT Patient with no hypoxia. Chest pain and dyspnea resolved. Started on heparin drip and transitioned to Eliquis. Recommend treatment for 3-6 months.  Pneumonia vs infarct Likely infarcts. Pulmonology consulted prior to admission. Given Levaquin on admission. Unlikely infection. Recommend outpatient repeat imaging.  Diabetes mellitus Insulin dependent.  On degludec 60-68 units as an outpatient. Fairly controlled for hospital setting. Sliding scale while inpatient. Resume home regimen on discharge.  Essential hypertension Continued losartan  Hyperlipidemia Continued Simvastatin  Obesity Body mass index is 48.26 kg/m.   Discharge Diagnoses:  Principal Problem:   Pulmonary emboli (HCC) Active Problems:   GERD (gastroesophageal reflux disease)   Morbid obesity ([place])   Uncontrolled diabetes mellitus with hyperglycemia, with long-term current use of insulin (HCC)   Obesity, Class III, BMI 40-49.9 (morbid obesity) ([place])   Eczema    Discharge Instructions  Discharge Instructions    Call MD for:  difficulty breathing, headache or visual disturbances   Complete by:  As directed    Call MD for:  severe uncontrolled pain   Complete by:  As directed    Diet - low sodium heart healthy   Complete by:  As directed    Increase activity slowly   Complete by:  As directed      Allergies as of 05/24/2018      Reactions   Insulin Nph Isophane & Regular Rash   Pt has this reaction to humalog & lantus when taken together    Humalog Kwikpen [insulin Lispro] Other (See Comments)   75/50---swelling and ulcers in mouth       Medication List    STOP taking these medications   aspirin 81 MG chewable tablet     TAKE these medications   Alcohol Swabs 70 % Pads 1 application by Does not apply route 3 (three) times daily before meals.  apixaban 5 MG Tabs tablet Commonly known as:  ELIQUIS Take 2 tablets (10 mg total) by mouth 2 (two) times daily for 7 days, THEN 1 tablet (5 mg total) 2 (two) times daily. Start taking on:  05/24/2018   CENTRUM ADULTS PO Take 1 tablet by mouth daily.   cetirizine 10 MG tablet Commonly known as:  ZYRTEC Take 10 mg by mouth daily.   fluticasone 50 MCG/ACT nasal spray Commonly known as:  FLONASE Place 2 sprays into the nose daily as needed.   glucose blood test strip Use as instructed     Insulin Degludec 200 UNIT/ML Sopn Inject 60-68 Units into the skin daily.   insulin lispro 100 UNIT/ML injection Commonly known as:  HUMALOG Inject 0.06 mLs (6 Units total) into the skin 3 (three) times daily before meals. What changed:  how much to take   INSULIN SYRINGE 1CC/28G 28G X 1/2" 1 ML Misc 1 application by Does not apply route 3 (three) times daily before meals.   olmesartan 20 MG tablet Commonly known as:  BENICAR Take 20 mg by mouth daily.   simvastatin 40 MG tablet Commonly known as:  ZOCOR Take 40 mg by mouth at bedtime.      Follow-up Information    Jonathon Jordan, MD. Schedule an appointment as soon as possible for a visit in 1 week(s).   Specialty:  Family Medicine Contact information: [address] [place] [address] [phone]          Allergies  Allergen Reactions  . Insulin Nph Isophane & Regular Rash    Pt has this reaction to humalog & lantus when taken together   . Humalog Kwikpen [Insulin Lispro] Other (See Comments)    75/50---swelling and ulcers in mouth     Consultations:  None   Procedures/Studies: Dg Chest 2 View  Result Date: 05/22/2018 CLINICAL DATA:  Dyspnea, lower extremity swelling EXAM: CHEST - 2 VIEW COMPARISON:  06/05/2016 chest radiograph. FINDINGS: Top-normal heart size. Normal mediastinal contour. No pneumothorax. No pleural effusions. Mild patchy bilateral parahilar lung opacities, left greater than right. IMPRESSION: Mild patchy bilateral parahilar lung opacities, which could represent mild pulmonary edema or multilobar pneumonia. Recommend attention on follow-up chest radiographs. Electronically Signed   By: Ilona Sorrel M.D.   On: 05/22/2018 11:36   Ct Angio Chest Pe W And/or Wo Contrast  Result Date: 05/22/2018 CLINICAL DATA:  Dyspnea. EXAM: CT ANGIOGRAPHY CHEST WITH CONTRAST TECHNIQUE: Multidetector CT imaging of the chest was performed using the standard protocol during bolus  administration of intravenous contrast. Multiplanar CT image reconstructions and MIPs were obtained to evaluate the vascular anatomy. CONTRAST:  152mL ISOVUE-370 IOPAMIDOL (ISOVUE-370) INJECTION 76% COMPARISON:  Radiographs of same day. FINDINGS: Cardiovascular: Large filling defects are noted in the lower lobes of both pulmonary arteries consistent with pulmonary emboli, right greater than left. RV/LV ratio of 1.0 is noted suggesting right heart strain. Normal cardiac size. No pericardial effusion. Mediastinum/Nodes: No enlarged mediastinal, hilar, or axillary lymph nodes. Thyroid gland, trachea, and esophagus demonstrate no significant findings. Lungs/Pleura: No pneumothorax or pleural effusion is noted. Multiple airspace opacities are noted throughout both lungs, most prominently seen in left lung apex and right upper lobe, consistent with multifocal pneumonia. Upper Abdomen: No acute abnormality. Musculoskeletal: No chest wall abnormality. No acute or significant osseous findings. Review of the MIP images confirms the above findings. IMPRESSION: Large filling defects are noted in the lower lobe branches of both pulmonary arteries consistent pulmonary emboli. Positive  for acute PE with CT evidence of right heart strain (RV/LV Ratio = 1.0) consistent with at least submassive (intermediate risk) PE. The presence of right heart strain has been associated with an increased risk of morbidity and mortality. Please activate Code PE by paging [phone]. Critical Value/emergent results were called by telephone at the time of interpretation on 05/22/2018 at 1:08 pm to Dr. Merrily Pew , who verbally acknowledged these results. Bilateral airspace opacities are noted most consistent with multifocal pneumonia or less likely pulmonary infarctions. Electronically Signed   By: Marijo Conception, M.D.   On: 05/22/2018 13:09     8/25: Transthoracic Echocardiogram  Study Conclusions  - Left ventricle: The cavity size was  normal. Wall thickness was increased in a pattern of moderate LVH. Systolic function was vigorous. The estimated ejection fraction was in the range of 65% to 70%. Diastolic function is abnormal, indeterminant grade. Wall motion was normal; there were no regional wall motion abnormalities. - Aortic valve: Valve area (VTI): 2.47 cm^2. Valve area (Vmax): 2.86 cm^2.   Subjective: No chest pain or dyspnea  Discharge Exam: Vitals:   05/24/18 0459 05/24/18 1324  BP: 114/87 136/77  Pulse: 83 79  Resp: 18 16  Temp: 98.5 F (36.9 C) 98.3 F (36.8 C)  SpO2: 94% 96%   Vitals:   05/23/18 1701 05/23/18 2108 05/24/18 0459 05/24/18 1324  BP: (!) 114/50 139/86 114/87 136/77  Pulse: 94 89 83 79  Resp:  16 18 16   Temp:  98.6 F (37 C) 98.5 F (36.9 C) 98.3 F (36.8 C)  TempSrc:  Oral Oral Oral  SpO2:  91% 94% 96%  Weight:      Height:        General: Pt is alert, awake, not in acute distress Cardiovascular: RRR, S1/S2 +, no rubs, no gallops Respiratory: CTA bilaterally, no wheezing, no rhonchi Abdominal: Soft, NT, ND, bowel sounds + Extremities: no edema, no cyanosis    The results of significant diagnostics from this hospitalization (including imaging, microbiology, ancillary and laboratory) are listed below for reference.     Microbiology: No results found for this or any previous visit (from the past 240 hour(s)).   Labs: BNP (last 3 results) Recent Labs    05/22/18 1050  BNP 179.1*   Basic Metabolic Panel: Recent Labs  Lab 05/22/18 1050 05/23/18 0145  NA 139 140  K 4.0 3.7  CL 104 105  CO2 26 26  GLUCOSE 182* 177*  BUN 15 13  CREATININE 0.60 0.63  CALCIUM 8.8* 8.5*   Liver Function Tests: Recent Labs  Lab 05/22/18 1050  AST 21  ALT 14  ALKPHOS 90  BILITOT 0.5  PROT 7.3  ALBUMIN 3.6   No results for input(s): LIPASE, AMYLASE in the last 168 hours. No results for input(s): AMMONIA in the last 168 hours. CBC: Recent Labs  Lab  05/22/18 1050 05/23/18 0145 05/24/18 0517  WBC 8.9 10.8* 9.5  NEUTROABS 6.3  --   --   HGB 10.0* 8.7* 9.0*  HCT 30.7* 27.1* 28.3*  MCV 96.8 96.8 97.6  PLT 303 281 303   Cardiac Enzymes: Recent Labs  Lab 05/22/18 1050 05/22/18 1659 05/22/18 2329 05/23/18 0145  TROPONINI <0.03 <0.03 <0.03 <0.03   BNP: Invalid input(s): POCBNP CBG: Recent Labs  Lab 05/23/18 1139 05/23/18 1645 05/23/18 2111 05/24/18 0820 05/24/18 1108  GLUCAP 161* 200* 247* 218* 261*   D-Dimer No results for input(s): DDIMER in the last 72 hours. Hgb A1c No  results for input(s): HGBA1C in the last 72 hours. Lipid Profile No results for input(s): CHOL, HDL, LDLCALC, TRIG, CHOLHDL, LDLDIRECT in the last 72 hours. Thyroid function studies No results for input(s): TSH, T4TOTAL, T3FREE, THYROIDAB in the last 72 hours.  Invalid input(s): FREET3 Anemia work up No results for input(s): VITAMINB12, FOLATE, FERRITIN, TIBC, IRON, RETICCTPCT in the last 72 hours. Urinalysis    Component Value Date/Time   COLORURINE YELLOW 05/23/2018 1711   APPEARANCEUR CLEAR 05/23/2018 1711   LABSPEC >1.030 (H) 05/23/2018 1711   PHURINE 5.5 05/23/2018 1711   GLUCOSEU NEGATIVE 05/23/2018 1711   HGBUR SMALL (A) 05/23/2018 1711   BILIRUBINUR SMALL (A) 05/23/2018 1711   KETONESUR NEGATIVE 05/23/2018 1711   PROTEINUR 30 (A) 05/23/2018 1711   UROBILINOGEN 1.0 08/14/2009 0850   NITRITE NEGATIVE 05/23/2018 1711   LEUKOCYTESUR NEGATIVE 05/23/2018 1711     SIGNED:   Cordelia Poche, MD [facility] 05/24/2018, 2:42 PM

## 2018-05-24 NOTE — Progress Notes (Signed)
Patient oxygen saturation on room air was 99% at rest.  Patient oxygen saturation was 98% standing.  Patient oxygen saturation dropped as low as 90% while ambulating.  Patient tolerated well but did c/o leg pain in L leg during walk.  Dr. Lonny Prude aware.  Will continue to monitor.

## 2018-05-24 NOTE — Progress Notes (Addendum)
[facility] for heparin --> apixaban  Indication: acute pulmonary embolus, left leg DVT   Allergies  Allergen Reactions  . Insulin Nph Isophane & Regular Rash    Pt has this reaction to humalog & lantus when taken together   . Humalog Kwikpen [Insulin Lispro] Other (See Comments)    75/50---swelling and ulcers in mouth     Patient Measurements:  Height: (S) 5\' 5"  (165.1 cm) Weight: (S) 290 lb (131.5 kg) IBW/kg (Calculated) : 57 Heparin Dosing Weight: 89 kg  Vital Signs: Temp: 98.5 F (36.9 C) (08/26 0459) Temp Source: Oral (08/26 0459) BP: 114/87 (08/26 0459) Pulse Rate: 83 (08/26 0459)  Labs: Recent Labs    05/22/18 1050 05/22/18 1659 05/22/18 1945 05/22/18 2329 05/23/18 0145 05/24/18 0517  HGB 10.0*  --   --   --  8.7* 9.0*  HCT 30.7*  --   --   --  27.1* 28.3*  PLT 303  --   --   --  281 303  HEPARINUNFRC  --   --  0.51  --  0.50 0.66  CREATININE 0.60  --   --   --  0.63  --   TROPONINI <0.03 <0.03  --  <0.03 <0.03  --     Estimated Creatinine Clearance: 129.4 mL/min (by C-G formula based on SCr of 0.63 mg/dL).   Assessment: Patient's a 37 y.o F presented to the ED on 05/22/18 with c/o bilateral leg and ankle swelling. Left  LE doppler showed "age indeterminate deep vein thrombosis involving the popliteal vein of the left lower extremity."  Chest CTA showed acute bilateral PE (at least submassive) with evidence of right heart strain. Pharmacy consulted to start heparin infusion.   Today, 05/24/18:   0517 heparin level = 0.66 units/mL, remains therapeutic on heparin infusion at 1750 units/hr  Patient reported bright red blood in urine yesterday evening; UA shows small Hgb, 0-5 RBC  CBC: Hgb 10 > 8.7 > 9, Pltc WNL   Asked to transition to Apixaban today per TRH   Goal of Therapy:  Appropriate dosing of Apixaban for indication Heparin level 0.3-0.7 units/ml Monitor platelets by anticoagulation protocol: Yes   Plan:  -  Dr. Lonny Prude aware of blood in urine reported by patient yesterday. Okay to continue with anticoagulation and will continue to monitor closely.   - Stop heparin infusion. At same time, start Apixaban 10mg  PO BID x 7 days, then decrease to 5mg  PO BID.   - Monitor closely for s/sx bleeding - Pharmacy to provide 30 day free card and medication counseling prior to discharge.    Lindell Spar, PharmD, BCPS Pager: [phone] 05/24/2018 10:44 AM

## 2019-03-10 ENCOUNTER — Encounter (HOSPITAL_COMMUNITY): Payer: Self-pay

## 2019-03-10 ENCOUNTER — Observation Stay (HOSPITAL_COMMUNITY)
Admission: EM | Admit: 2019-03-10 | Discharge: 2019-03-11 | Disposition: A | Discharge status: Home / Self Care | Payer: 59 | Attending: Internal Medicine | Admitting: Internal Medicine

## 2019-03-10 ENCOUNTER — Emergency Department (HOSPITAL_COMMUNITY): Payer: 59

## 2019-03-10 ENCOUNTER — Other Ambulatory Visit: Payer: Self-pay

## 2019-03-10 ENCOUNTER — Inpatient Hospital Stay (HOSPITAL_COMMUNITY): Payer: 59

## 2019-03-10 DIAGNOSIS — D500 Iron deficiency anemia secondary to blood loss (chronic): Principal | ICD-10-CM | POA: Diagnosis present

## 2019-03-10 DIAGNOSIS — I2699 Other pulmonary embolism without acute cor pulmonale: Secondary | ICD-10-CM | POA: Diagnosis present

## 2019-03-10 DIAGNOSIS — Z86711 Personal history of pulmonary embolism: Secondary | ICD-10-CM | POA: Insufficient documentation

## 2019-03-10 DIAGNOSIS — I10 Essential (primary) hypertension: Secondary | ICD-10-CM | POA: Diagnosis not present

## 2019-03-10 DIAGNOSIS — D519 Vitamin B12 deficiency anemia, unspecified: Secondary | ICD-10-CM | POA: Diagnosis present

## 2019-03-10 DIAGNOSIS — Z7901 Long term (current) use of anticoagulants: Secondary | ICD-10-CM | POA: Insufficient documentation

## 2019-03-10 DIAGNOSIS — Z79899 Other long term (current) drug therapy: Secondary | ICD-10-CM | POA: Insufficient documentation

## 2019-03-10 DIAGNOSIS — D638 Anemia in other chronic diseases classified elsewhere: Secondary | ICD-10-CM | POA: Diagnosis present

## 2019-03-10 DIAGNOSIS — E119 Type 2 diabetes mellitus without complications: Secondary | ICD-10-CM | POA: Diagnosis not present

## 2019-03-10 DIAGNOSIS — Z794 Long term (current) use of insulin: Secondary | ICD-10-CM | POA: Diagnosis not present

## 2019-03-10 DIAGNOSIS — Z1159 Encounter for screening for other viral diseases: Secondary | ICD-10-CM | POA: Diagnosis not present

## 2019-03-10 DIAGNOSIS — Z6841 Body Mass Index (BMI) 40.0 and over, adult: Secondary | ICD-10-CM | POA: Diagnosis not present

## 2019-03-10 DIAGNOSIS — Z86718 Personal history of other venous thrombosis and embolism: Secondary | ICD-10-CM | POA: Insufficient documentation

## 2019-03-10 DIAGNOSIS — J810 Acute pulmonary edema: Secondary | ICD-10-CM

## 2019-03-10 DIAGNOSIS — I340 Nonrheumatic mitral (valve) insufficiency: Secondary | ICD-10-CM | POA: Diagnosis not present

## 2019-03-10 DIAGNOSIS — D649 Anemia, unspecified: Secondary | ICD-10-CM | POA: Diagnosis present

## 2019-03-10 DIAGNOSIS — E1165 Type 2 diabetes mellitus with hyperglycemia: Secondary | ICD-10-CM

## 2019-03-10 DIAGNOSIS — E66813 Obesity, class 3: Secondary | ICD-10-CM | POA: Diagnosis present

## 2019-03-10 HISTORY — DX: Other pulmonary embolism without acute cor pulmonale: I26.99

## 2019-03-10 HISTORY — DX: Anemia, unspecified: D64.9

## 2019-03-10 HISTORY — DX: Heart failure, unspecified: I50.9

## 2019-03-10 HISTORY — DX: Unspecified asthma, uncomplicated: J45.909

## 2019-03-10 LAB — CBC WITH DIFFERENTIAL/PLATELET
Abs Immature Granulocytes: 0.07 10*3/uL (ref 0.00–0.07)
Basophils Absolute: 0 10*3/uL (ref 0.0–0.1)
Basophils Relative: 0 %
Eosinophils Absolute: 0.1 10*3/uL (ref 0.0–0.5)
Eosinophils Relative: 2 %
HCT: 20.6 % — ABNORMAL LOW (ref 36.0–46.0)
Hemoglobin: 6.8 g/dL — CL (ref 12.0–15.0)
Immature Granulocytes: 1 %
Lymphocytes Relative: 28 %
Lymphs Abs: 2 10*3/uL (ref 0.7–4.0)
MCH: 37 pg — ABNORMAL HIGH (ref 26.0–34.0)
MCHC: 33 g/dL (ref 30.0–36.0)
MCV: 112 fL — ABNORMAL HIGH (ref 80.0–100.0)
Monocytes Absolute: 0.1 10*3/uL (ref 0.1–1.0)
Monocytes Relative: 1 %
Neutro Abs: 4.8 10*3/uL (ref 1.7–7.7)
Neutrophils Relative %: 68 %
Platelets: 291 10*3/uL (ref 150–400)
RBC: 1.84 MIL/uL — ABNORMAL LOW (ref 3.87–5.11)
RDW: 20.6 % — ABNORMAL HIGH (ref 11.5–15.5)
WBC: 7.1 10*3/uL (ref 4.0–10.5)
nRBC: 0.4 % — ABNORMAL HIGH (ref 0.0–0.2)

## 2019-03-10 LAB — GLUCOSE, CAPILLARY
Glucose-Capillary: 116 mg/dL — ABNORMAL HIGH (ref 70–99)
Glucose-Capillary: 177 mg/dL — ABNORMAL HIGH (ref 70–99)
Glucose-Capillary: 52 mg/dL — ABNORMAL LOW (ref 70–99)
Glucose-Capillary: 56 mg/dL — ABNORMAL LOW (ref 70–99)
Glucose-Capillary: 60 mg/dL — ABNORMAL LOW (ref 70–99)
Glucose-Capillary: 99 mg/dL (ref 70–99)

## 2019-03-10 LAB — ECHOCARDIOGRAM COMPLETE
Height: 65 in
Weight: 4638.48 oz

## 2019-03-10 LAB — BASIC METABOLIC PANEL
Anion gap: 8 (ref 5–15)
BUN: 10 mg/dL (ref 6–20)
CO2: 27 mmol/L (ref 22–32)
Calcium: 8.7 mg/dL — ABNORMAL LOW (ref 8.9–10.3)
Chloride: 107 mmol/L (ref 98–111)
Creatinine, Ser: 0.57 mg/dL (ref 0.44–1.00)
GFR calc Af Amer: >60 mL/min (ref >60)
GFR calc non Af Amer: >60 mL/min (ref >60)
Glucose, Bld: 156 mg/dL — ABNORMAL HIGH (ref 70–99)
Potassium: 3.3 mmol/L — ABNORMAL LOW (ref 3.5–5.1)
Sodium: 142 mmol/L (ref 135–145)

## 2019-03-10 LAB — BRAIN NATRIURETIC PEPTIDE: B Natriuretic Peptide: 224.3 pg/mL — ABNORMAL HIGH (ref 0.0–100.0)

## 2019-03-10 LAB — VITAMIN B12: Vitamin B-12: <50 pg/mL — ABNORMAL LOW (ref 180–914)

## 2019-03-10 LAB — TSH: TSH: 3.344 u[IU]/mL (ref 0.350–4.500)

## 2019-03-10 LAB — PREPARE RBC (CROSSMATCH)

## 2019-03-10 LAB — HEMOGLOBIN A1C
Hgb A1c MFr Bld: 7.1 % — ABNORMAL HIGH (ref 4.8–5.6)
Mean Plasma Glucose: 157.07 mg/dL

## 2019-03-10 LAB — IRON AND TIBC
Iron: 54 ug/dL (ref 28–170)
Saturation Ratios: 23 % (ref 10.4–31.8)
TIBC: 236 ug/dL — ABNORMAL LOW (ref 250–450)
UIBC: 182 ug/dL

## 2019-03-10 LAB — RETICULOCYTES
Immature Retic Fract: 27.7 % — ABNORMAL HIGH (ref 2.3–15.9)
RBC.: 1.73 MIL/uL — ABNORMAL LOW (ref 3.87–5.11)
Retic Count, Absolute: 93.2 10*3/uL (ref 19.0–186.0)
Retic Ct Pct: 5.4 % — ABNORMAL HIGH (ref 0.4–3.1)

## 2019-03-10 LAB — FOLATE: Folate: 32.8 ng/mL (ref >5.9)

## 2019-03-10 LAB — ABO/RH: ABO/RH(D): A POS

## 2019-03-10 LAB — FERRITIN: Ferritin: 29 ng/mL (ref 11–307)

## 2019-03-10 LAB — SARS CORONAVIRUS 2 BY RT PCR (HOSPITAL ORDER, PERFORMED IN ~~LOC~~ HOSPITAL LAB): SARS Coronavirus 2: NEGATIVE

## 2019-03-10 IMAGING — CR CHEST - 2 VIEW
2 series · 2 of 2 positions shown · non-contrast
Comparison: CTA chest and two-view chest x-ray [DATE].

CLINICAL DATA: Shortness of breath for 2 days. Personal history of
pulmonary embolus.

EXAM:
CHEST - 2 VIEW

[w chest pa]
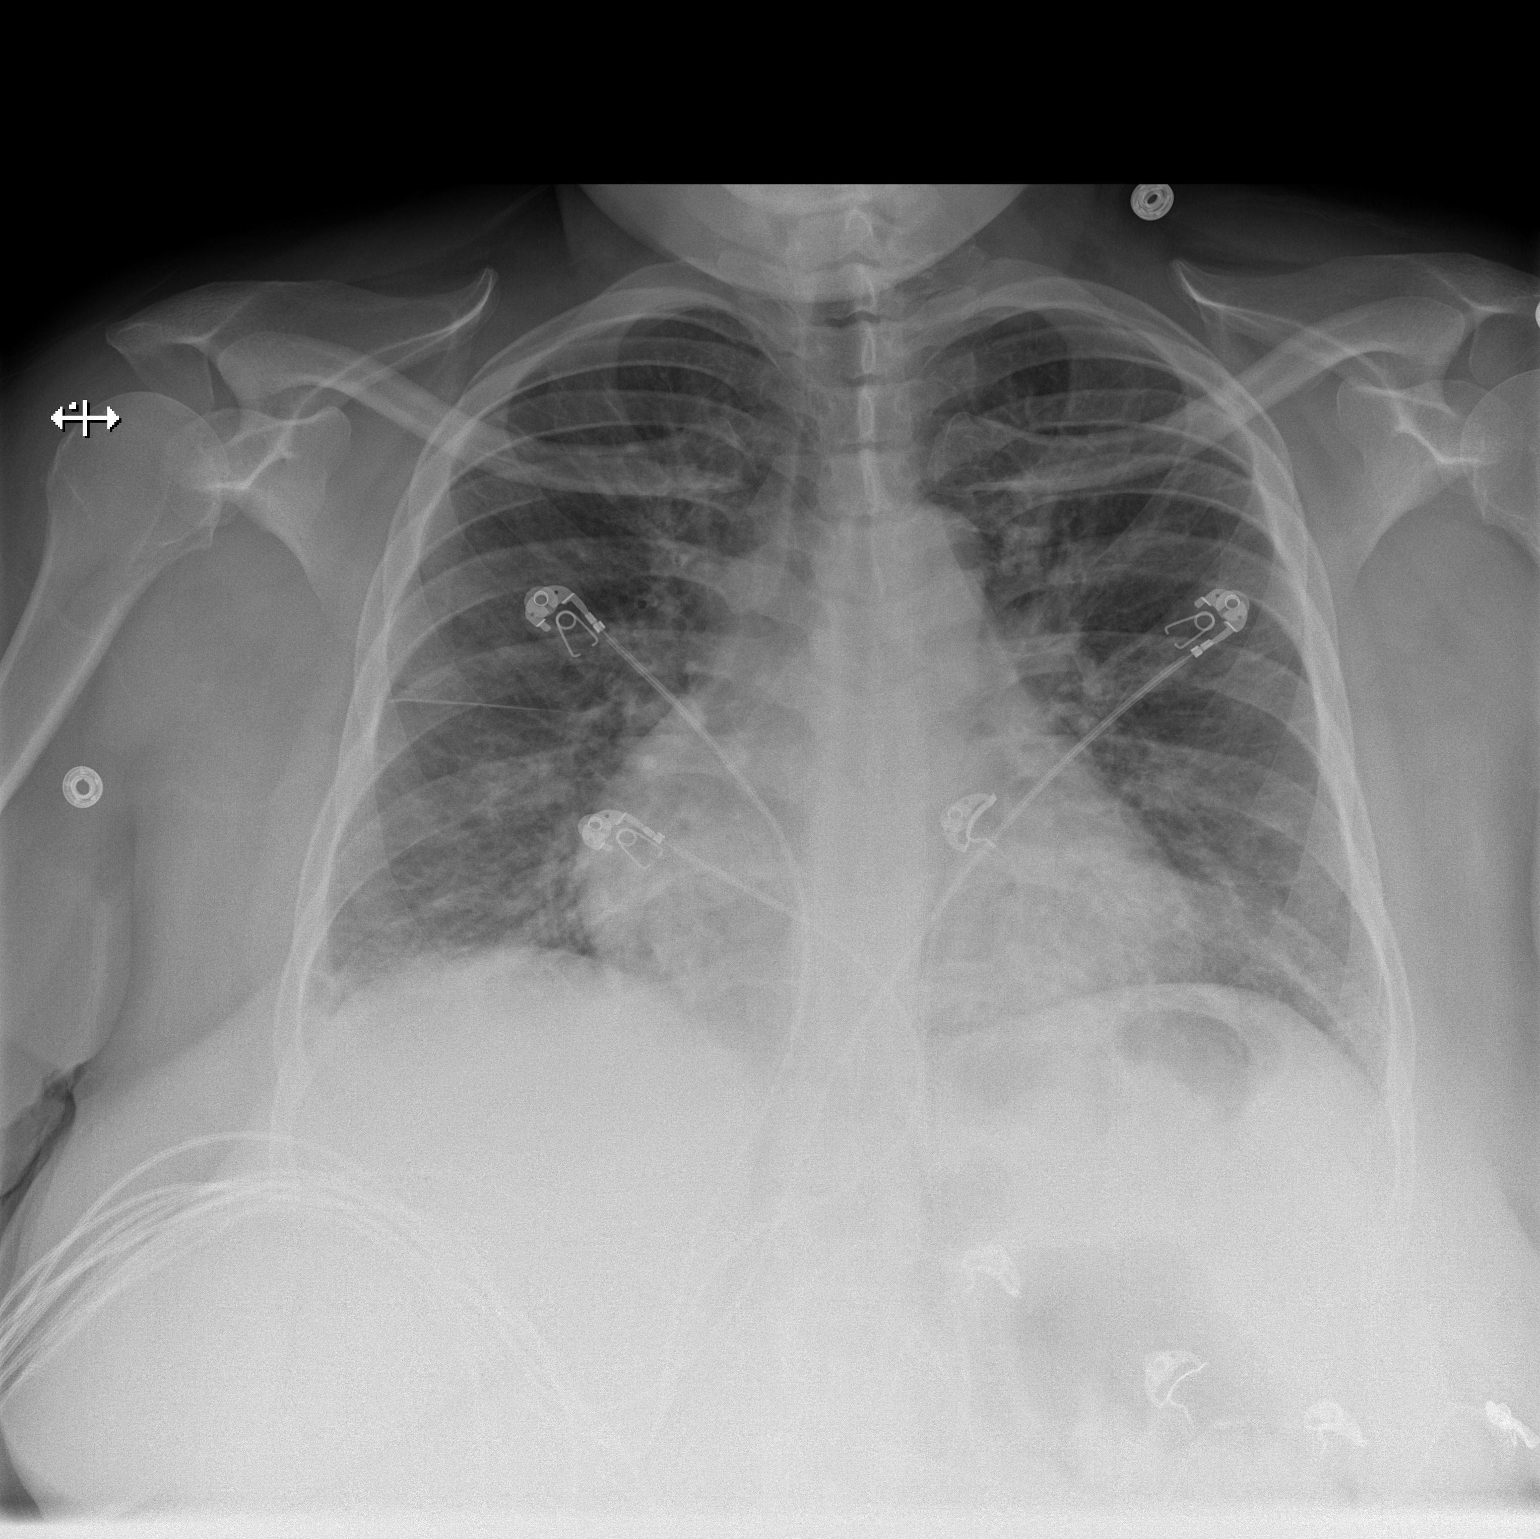

[w chest lat]
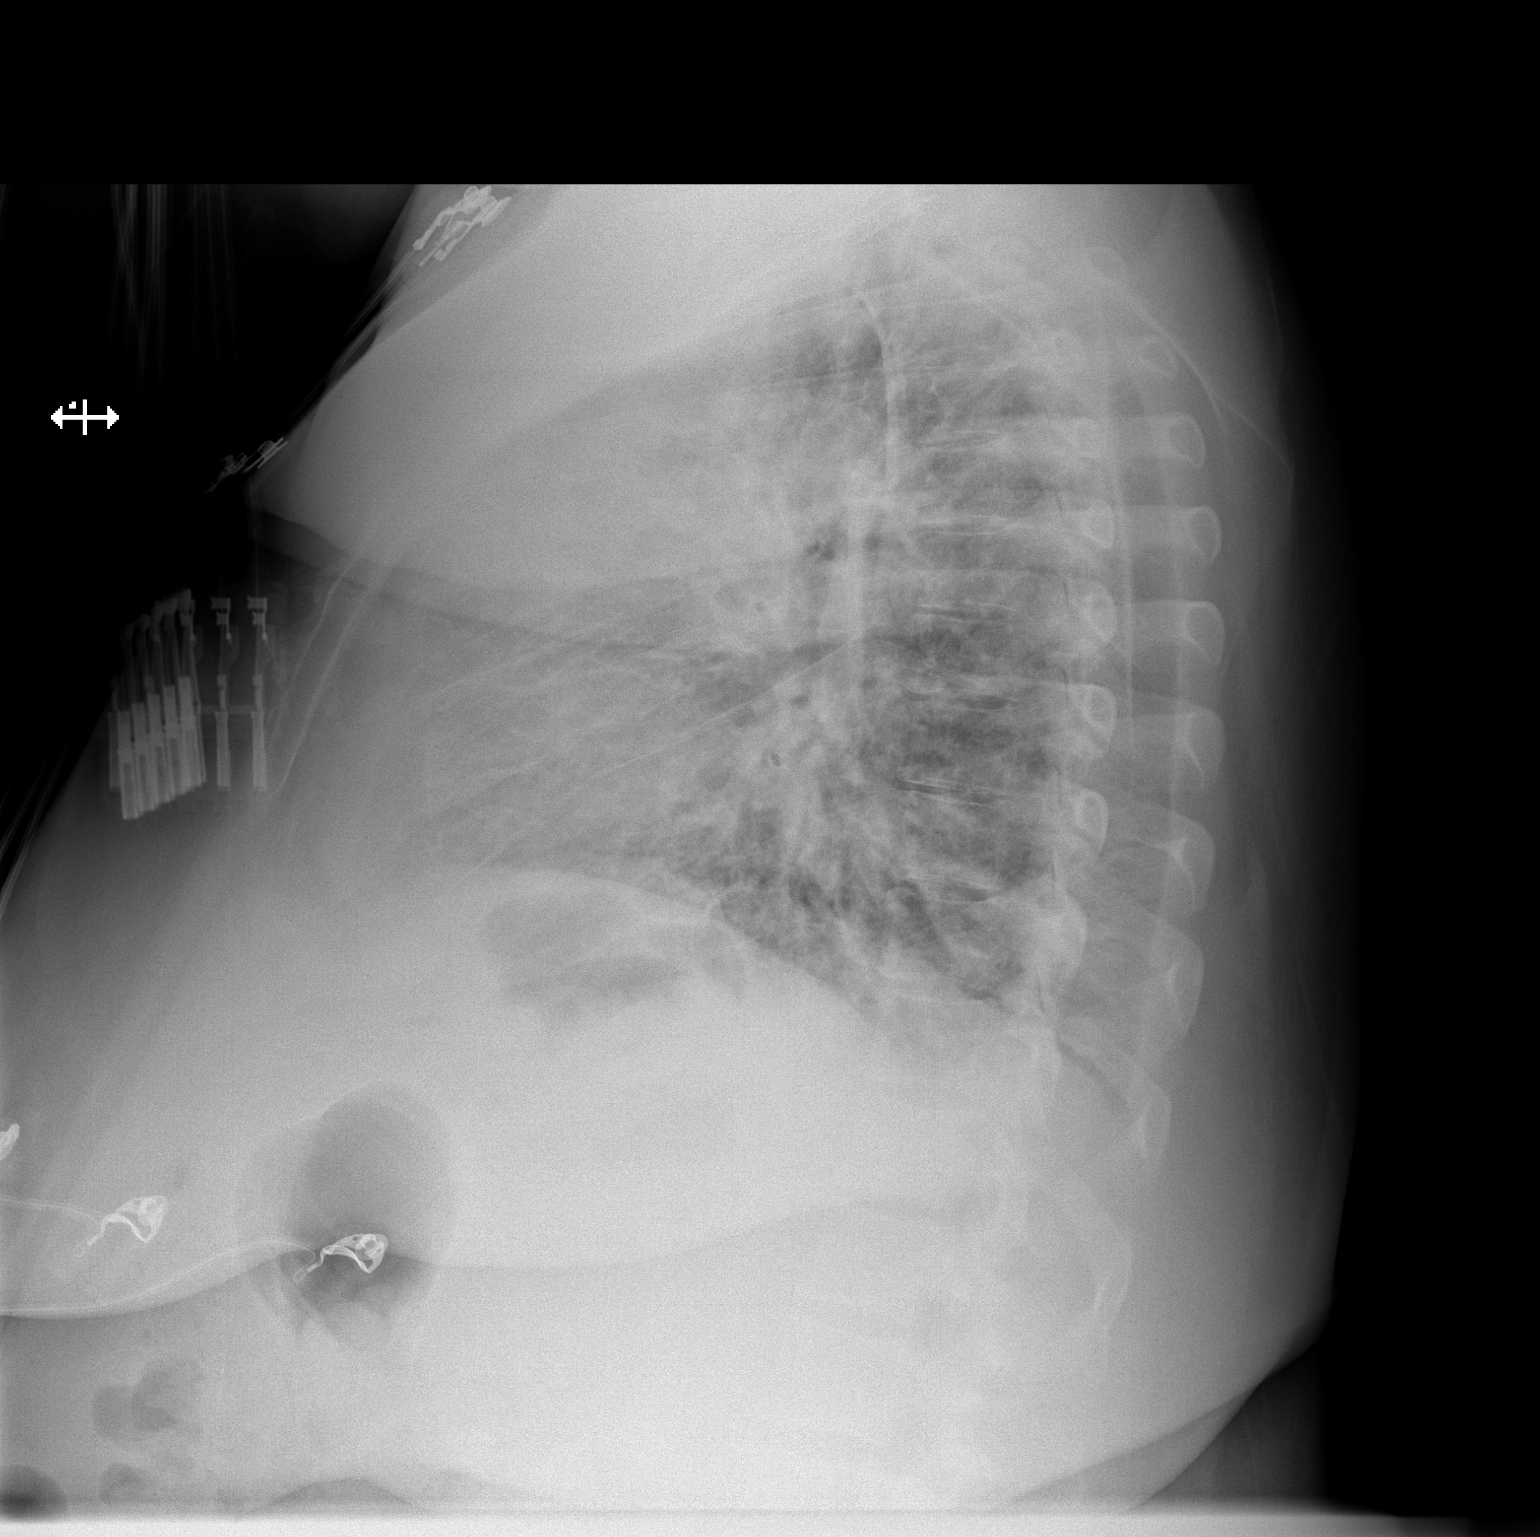

[2 of 2 positions shown; findings below may reference images not displayed]

FINDINGS: Heart size is upper limits of normal. This is exaggerated by low
lung volumes. Bilateral airspace opacities are most prominent at the
bases. Mild pulmonary vascular congestion is present. Small
effusions are suspected.
IMPRESSION: 1. Borderline cardiomegaly with mild pulmonary vascular congestion
small effusions. This may reflect congestive heart failure.
2. Bibasilar airspace opacities may represent atelectasis. Infection
with multi lobar pneumonia should also be considered.

## 2019-03-10 MED ORDER — INSULIN ASPART 100 UNIT/ML ~~LOC~~ SOLN
0.0000 [IU] | Freq: Three times a day (TID) | SUBCUTANEOUS | Status: DC
  Administered 2019-03-10: 3 [IU] via SUBCUTANEOUS

## 2019-03-10 MED ORDER — ACETAMINOPHEN 650 MG RE SUPP: 650.0000 mg | Freq: Four times a day (QID) | RECTAL | Status: DC | PRN

## 2019-03-10 MED ORDER — INSULIN LISPRO 100 UNIT/ML ~~LOC~~ SOLN: 6.0000 [IU] | Freq: Three times a day (TID) | SUBCUTANEOUS | Status: DC

## 2019-03-10 MED ORDER — PERFLUTREN LIPID MICROSPHERE
1.0000 mL | INTRAVENOUS | Status: AC | PRN
  Administered 2019-03-10: 13:00:00 4 mL via INTRAVENOUS

## 2019-03-10 MED ORDER — ONDANSETRON HCL 4 MG/2ML IJ SOLN: 4.0000 mg | Freq: Four times a day (QID) | INTRAMUSCULAR | Status: DC | PRN

## 2019-03-10 MED ORDER — INSULIN ASPART 100 UNIT/ML ~~LOC~~ SOLN
6.0000 [IU] | Freq: Three times a day (TID) | SUBCUTANEOUS | Status: DC
  Administered 2019-03-10: 6 [IU] via SUBCUTANEOUS

## 2019-03-10 MED ORDER — LORATADINE 10 MG PO TABS: 10.0000 mg | ORAL_TABLET | Freq: Every day | ORAL | Status: DC

## 2019-03-10 MED ORDER — FUROSEMIDE 10 MG/ML IJ SOLN
40.0000 mg | Freq: Once | INTRAMUSCULAR | Status: AC
  Administered 2019-03-10: 40 mg via INTRAVENOUS
  Filled 2019-03-10: qty 4

## 2019-03-10 MED ORDER — SODIUM CHLORIDE 0.9 % IV SOLN
10.0000 mL/h | Freq: Once | INTRAVENOUS | Status: AC
  Administered 2019-03-10: 11:00:00 10 mL/h via INTRAVENOUS

## 2019-03-10 MED ORDER — POTASSIUM CHLORIDE CRYS ER 20 MEQ PO TBCR
40.0000 meq | EXTENDED_RELEASE_TABLET | Freq: Three times a day (TID) | ORAL | Status: AC
  Administered 2019-03-10 (×3): 40 meq via ORAL
  Filled 2019-03-10 (×4): qty 2

## 2019-03-10 MED ORDER — ACETAMINOPHEN 325 MG PO TABS: 650.0000 mg | ORAL_TABLET | Freq: Four times a day (QID) | ORAL | Status: DC | PRN

## 2019-03-10 MED ORDER — APIXABAN 5 MG PO TABS: 5.0000 mg | ORAL_TABLET | Freq: Two times a day (BID) | ORAL | Status: DC

## 2019-03-10 MED ORDER — INSULIN GLARGINE 100 UNIT/ML ~~LOC~~ SOLN
50.0000 [IU] | Freq: Every day | SUBCUTANEOUS | Status: DC
  Filled 2019-03-10 (×2): qty 0.5

## 2019-03-10 MED ORDER — ONDANSETRON HCL 4 MG PO TABS: 4.0000 mg | ORAL_TABLET | Freq: Four times a day (QID) | ORAL | Status: DC | PRN

## 2019-03-10 MED ORDER — RIVAROXABAN 20 MG PO TABS
20.0000 mg | ORAL_TABLET | Freq: Every day | ORAL | Status: DC
  Administered 2019-03-10: 20 mg via ORAL
  Filled 2019-03-10: qty 1

## 2019-03-10 MED ORDER — INSULIN GLARGINE 100 UNIT/ML ~~LOC~~ SOLN
50.0000 [IU] | Freq: Every day | SUBCUTANEOUS | Status: DC
  Filled 2019-03-10: qty 0.5

## 2019-03-10 MED ORDER — INSULIN DEGLUDEC 200 UNIT/ML ~~LOC~~ SOPN: 55.0000 [IU] | PEN_INJECTOR | Freq: Every day | SUBCUTANEOUS | Status: DC

## 2019-03-10 MED ORDER — SIMVASTATIN 40 MG PO TABS
40.0000 mg | ORAL_TABLET | Freq: Every day | ORAL | Status: DC
  Administered 2019-03-10: 40 mg via ORAL
  Filled 2019-03-10: qty 2
  Filled 2019-03-10: qty 1

## 2019-03-10 MED ORDER — FUROSEMIDE 10 MG/ML IJ SOLN
40.0000 mg | Freq: Three times a day (TID) | INTRAMUSCULAR | Status: AC
  Administered 2019-03-10 (×2): 40 mg via INTRAVENOUS
  Filled 2019-03-10 (×2): qty 4

## 2019-03-10 MED ORDER — IRBESARTAN 75 MG PO TABS
37.5000 mg | ORAL_TABLET | Freq: Every day | ORAL | Status: DC
  Administered 2019-03-10 – 2019-03-11 (×2): 37.5 mg via ORAL
  Filled 2019-03-10 (×2): qty 0.5

## 2019-03-10 NOTE — ED Triage Notes (Signed)
Per ems: Pt coming from home c/o shortness of breath that started at 0330. Hx of PE and asthma. Pt is on xarelto and has not missed a dose. Lungs clear. SHOB with exertion   98% on room air Hr 80, nsr BP: 180/110 Temp: 97.5 F CBG 211

## 2019-03-10 NOTE — ED Provider Notes (Signed)
[facility] DEPT Provider Note: Georgena Spurling, MD, FACEP  CSN: [id number] MRN: [id number] ARRIVAL: 03/10/19 at 0431 ROOM: WA09/WA09   CHIEF COMPLAINT  Shortness of Breath   HISTORY OF PRESENT ILLNESS  03/10/19 5:16 AM Sherri Dixon is a 38 y.o. female with a history of asthma, CHF and pulmonary embolism.  She is here with shortness of breath that became acute about 330 this morning.  She used her albuterol inhaler 3 times without adequate relief.  Symptoms at the present time are moderate.  She denies chest pain.  She feels like she is having difficulty pulling Arrien.  She feels like her lungs may be full of fluid.  She denies a fever but has had a cough.  She denies loss of smell or taste.  She has a history of anemia and states she has had heavy menses recently which she attributes to being on Xarelto for pulmonary embolism.   Past Medical History:  Diagnosis Date  . Anemia   . Asthma   . CHF (congestive heart failure) ([place])   . Diabetes mellitus without complication ([place])   . Dyslipidemia   . GERD (gastroesophageal reflux disease)   . Pulmonary embolism ([place])     History reviewed. No pertinent surgical history.  Family History  Problem Relation Age of Onset  . High blood pressure Mother   . Diabetes Mother   . Arthritis Mother   . Hyperlipidemia Mother   . High blood pressure Father   . Hyperlipidemia Father   . High blood pressure Sister   . Diabetes Brother   . Hyperlipidemia Brother   . High blood pressure Brother     Social History   Tobacco Use  . Smoking status: Never Smoker  . Smokeless tobacco: Never Used  Substance Use Topics  . Alcohol use: No  . Drug use: No    Prior to Admission medications   Medication Sig Start Date End Date Taking? Authorizing Provider  Alcohol Swabs 70 % PADS 1 application by Does not apply route 3 (three) times daily before meals. 06/06/16   Dhungel, Flonnie Overman, MD  apixaban (ELIQUIS) 5 MG TABS tablet Take 2 tablets (10 mg  total) by mouth 2 (two) times daily for 7 days, THEN 1 tablet (5 mg total) 2 (two) times daily. 05/24/18 06/30/18  Mariel Aloe, MD  cetirizine (ZYRTEC) 10 MG tablet Take 10 mg by mouth daily. 11/14/17 11/14/18  [provider]  fluticasone (FLONASE) 50 MCG/ACT nasal spray Place 2 sprays into the nose daily as needed. 11/14/17 11/14/18  [provider]  glucose blood test strip Use as instructed 06/06/16   Dhungel, Nishant, MD  Insulin Degludec 200 UNIT/ML SOPN Inject 60-68 Units into the skin daily. 04/27/18   [provider]  insulin lispro (HUMALOG) 100 UNIT/ML injection Inject 0.06 mLs (6 Units total) into the skin 3 (three) times daily before meals. Patient taking differently: Inject 12 Units into the skin 3 (three) times daily before meals.  06/06/16   Dhungel, Nishant, MD  Insulin Syringe-Needle U-100 (INSULIN SYRINGE 1CC/28G) 28G X 1/2" 1 ML MISC 1 application by Does not apply route 3 (three) times daily before meals. 06/06/16   Dhungel, Flonnie Overman, MD  Multiple Vitamins-Minerals (CENTRUM ADULTS PO) Take 1 tablet by mouth daily.    [provider]  olmesartan (BENICAR) 20 MG tablet Take 20 mg by mouth daily. 03/20/18   [provider]  simvastatin (ZOCOR) 40 MG tablet Take 40 mg by mouth at bedtime. 03/28/16  [provider]    Allergies Insulin nph isophane & regular and Humalog kwikpen [insulin lispro]   REVIEW OF SYSTEMS  Negative except as noted here or in the History of Present Illness.   PHYSICAL EXAMINATION  Initial Vital Signs Blood pressure (!) 181/95, pulse 83, temperature 98 F (36.7 C), temperature source Oral, resp. rate (!) 21, height 5\' 5"  (1.651 m), weight 131.5 kg, SpO2 98 %.  Examination General: Well-developed, obese female in no acute distress; appearance consistent with age of record HENT: normocephalic; atraumatic Eyes: pupils equal, round and reactive to light; extraocular muscles intact; conjunctival pallor Neck:  supple Heart: regular rate and rhythm Lungs: Mild tachypnea; decreased air movement in bases; shallow breaths Abdomen: soft; obese; nontender; bowel sounds present Extremities: No deformity; full range of motion; +1 pitting edema of lower legs Neurologic: Awake, alert and oriented; motor function intact in all extremities and symmetric; no facial droop Skin: Warm and dry; pale Psychiatric: Normal mood and affect   RESULTS  Summary of this visit's results, reviewed by myself:   EKG Interpretation  Date/Time:    Ventricular Rate:    PR Interval:    QRS Duration:   QT Interval:    QTC Calculation:   R Axis:     Text Interpretation:        Laboratory Studies: Results for orders placed or performed during the hospital encounter of 03/10/19 (from the past 24 hour(s))  CBC with Differential/Platelet     Status: Abnormal   Collection Time: 03/10/19  6:14 AM  Result Value Ref Range   WBC 7.1 4.0 - 10.5 K/uL   RBC 1.84 (L) 3.87 - 5.11 MIL/uL   Hemoglobin 6.8 (LL) 12.0 - 15.0 g/dL   HCT 20.6 (L) 36.0 - 46.0 %   MCV 112.0 (H) 80.0 - 100.0 fL   MCH 37.0 (H) 26.0 - 34.0 pg   MCHC 33.0 30.0 - 36.0 g/dL   RDW 20.6 (H) 11.5 - 15.5 %   Platelets 291 150 - 400 K/uL   nRBC 0.4 (H) 0.0 - 0.2 %   Neutrophils Relative % 68 %   Neutro Abs 4.8 1.7 - 7.7 K/uL   Lymphocytes Relative 28 %   Lymphs Abs 2.0 0.7 - 4.0 K/uL   Monocytes Relative 1 %   Monocytes Absolute 0.1 0.1 - 1.0 K/uL   Eosinophils Relative 2 %   Eosinophils Absolute 0.1 0.0 - 0.5 K/uL   Basophils Relative 0 %   Basophils Absolute 0.0 0.0 - 0.1 K/uL   Immature Granulocytes 1 %   Abs Immature Granulocytes 0.07 0.00 - 0.07 K/uL   Schistocytes PRESENT    Tear Drop Cells PRESENT    Polychromasia PRESENT   Basic metabolic panel     Status: Abnormal   Collection Time: 03/10/19  6:14 AM  Result Value Ref Range   Sodium 142 135 - 145 mmol/L   Potassium 3.3 (L) 3.5 - 5.1 mmol/L   Chloride 107 98 - 111 mmol/L   CO2 27 22 - 32  mmol/L   Glucose, Bld 156 (H) 70 - 99 mg/dL   BUN 10 6 - 20 mg/dL   Creatinine, Ser 0.57 0.44 - 1.00 mg/dL   Calcium 8.7 (L) 8.9 - 10.3 mg/dL   GFR calc non Af Amer >60 >60 mL/min   GFR calc Af Amer >60 >60 mL/min   Anion gap 8 5 - 15   Imaging Studies: Dg Chest 2 View  Result Date: 03/10/2019 CLINICAL DATA:  Shortness of breath for 2 days. Personal history of pulmonary embolus. EXAM: CHEST - 2 VIEW COMPARISON:  CTA chest and two-view chest x-ray 05/22/2018. FINDINGS: Heart size is upper limits of normal. This is exaggerated by low lung volumes. Bilateral airspace opacities are most prominent at the bases. Mild pulmonary vascular congestion is present. Small effusions are suspected. IMPRESSION: 1. Borderline cardiomegaly with mild pulmonary vascular congestion small effusions. This may reflect congestive heart failure. 2. Bibasilar airspace opacities may represent atelectasis. Infection with multi lobar pneumonia should also be considered. Electronically Signed   By: San Morelle M.D.   On: 03/10/2019 06:51    ED COURSE and MDM  Nursing notes and initial vitals signs, including pulse oximetry, reviewed.  Vitals:   03/10/19 0444 03/10/19 0445 03/10/19 0530 03/10/19 0651  BP:  (!) 181/95 (!) 172/78 (!) 190/96  Pulse:  83 78 87  Resp:  (!) 21 19 17   Temp:  98 F (36.7 C)    TempSrc:  Oral    SpO2:  98% 95% 100%  Weight:      Height: 5\' 5"  (1.651 m)      Darshay Pfost was evaluated in Emergency Department on 03/10/2019 for the symptoms described in the history of present illness. She was evaluated in the context of the global COVID-19 pandemic, which necessitated consideration that the patient might be at risk for infection with the SARS-CoV-2 virus that causes COVID-19. Institutional protocols and algorithms that pertain to the evaluation of patients at risk for COVID-19 are in a state of rapid change based on information released by regulatory bodies including the CDC and federal  and state organizations. These policies and algorithms were followed during the patient's care in the ED.  7:08 AM Dr. Darl Householder to take over care of patient and make disposition.  COVID-19 test is pending but I suspect her shortness of breath is due to the pulmonary edema from anemia.  PROCEDURES    ED DIAGNOSES     ICD-10-CM   1. Acute pulmonary edema (HCC)  J81.0   2. Symptomatic anemia  D64.9        Esther Broyles, Jenny Reichmann, MD 03/10/19 [phone]

## 2019-03-10 NOTE — ED Provider Notes (Signed)
  Physical Exam  BP (!) 190/96   Pulse 87   Temp 98 F (36.7 C) (Oral)   Resp 17   Ht 5\' 5"  (1.651 m)   Wt 131.5 kg   SpO2 100%   BMI 48.24 kg/m   Physical Exam  ED Course/Procedures     Procedures  MDM  Care assumed at 7 am.  Patient has history of symptomatic anemia secondary to menorrhagia.  Patient has heavy menses a week ago that stopped about 2 days ago.  Shortness of breath started yesterday and she has progressive leg swelling as well.  Patient had previous echo that showed diastolic dysfunction.  Signed out pending labs.  Patient's chest x-ray showed pulmonary edema.  8:02 AM BNP 220. CBC showed Hg is 6.8. Appears volume overloaded so ordered lasix. Ordered 1 U PRBC and anemia panel. This is likely symptomatic anemia from menorrhagia. No active bleeding currently. Hospitalist to admit   CRITICAL CARE Performed by: Wandra Arthurs   Total critical care time: 30 minutes  Critical care time was exclusive of separately billable procedures and treating other patients.  Critical care was necessary to treat or prevent imminent or life-threatening deterioration.  Critical care was time spent personally by me on the following activities: development of treatment plan with patient and/or surrogate as well as nursing, discussions with consultants, evaluation of patient's response to treatment, examination of patient, obtaining history from patient or surrogate, ordering and performing treatments and interventions, ordering and review of laboratory studies, ordering and review of radiographic studies, pulse oximetry and re-evaluation of patient's condition.       Drenda Freeze, MD 03/10/19 [phone]

## 2019-03-10 NOTE — ED Notes (Signed)
Bed: WA09 Expected date:  Expected time:  Means of arrival:  Comments: 38 yr old short of breath/asthma

## 2019-03-10 NOTE — ED Notes (Signed)
Date and time results received: 03/10/19 6:57 AM   Test: Hemoglobin  Critical Value: 6.8  Name of Provider Notified: Dr. Florina Ou, MD   Orders Received? Or Actions Taken?: see orders for details.

## 2019-03-10 NOTE — Plan of Care (Signed)
  Problem: Education: Goal: Knowledge of General Education information will improve Description: Including pain rating scale, medication(s)/side effects and non-pharmacologic comfort measures Outcome: Completed/Met   Problem: Coping: Goal: Level of anxiety will decrease Outcome: Completed/Met   Problem: Elimination: Goal: Will not experience complications related to bowel motility Outcome: Completed/Met Goal: Will not experience complications related to urinary retention Outcome: Completed/Met

## 2019-03-10 NOTE — H&P (Addendum)
History and Physical    Sherri Dixon OHY:[id number] DOB: Oct 18, 1980 DOA: 03/10/2019  Referring MD/NP/PA: EDP PCP:  Patient coming from: Home  Chief Complaint: shortness of breath  HPI: Sherri Dixon is a 38 y.o. female with medical history significant of obesity, type 2 diabetes mellitus, hypertension, chronic iron deficiency anemia from heavy menstrual blood loss, history of DVT/pulmonary embolism in 7/201 9 on Xarelto presented to the emergency room with shortness of breath since yesterday. -Patient reports that she used to be on IV iron infusions, followed by hematologist at [facility] however has not gotten any iron since January, she also has had heavy menstrual bleeding since starting Xarelto last year, last couple of days she noticed acute worsening of dyspnea suspected that she may have developed another blood clot despite taking Xarelto, hence presented to the emergency room today. -He is also had chronic left lower extremity edema for the past year ever since her DVT however has also noticed swelling in her right leg as well for several weeks to months, her primary care doctor started her on low-dose Lasix not too long ago which she has been compliant with. ED Course: In the emergency room she was noted to have worsening chronic anemia with hemoglobin of 6.8, potassium was 3.3, chest x-ray was concerning for pulmonary edema  Review of Systems: As per HPI otherwise 14 point review of systems negative.   Past Medical History:  Diagnosis Date  . Anemia   . Asthma   . CHF (congestive heart failure) ([place])   . Diabetes mellitus without complication ([place])   . Dyslipidemia   . GERD (gastroesophageal reflux disease)   . Pulmonary embolism ([place])     History reviewed. No pertinent surgical history.   reports that she has never smoked. She has never used smokeless tobacco. She reports that she does not drink alcohol or use drugs.  Allergies  Allergen Reactions  . Insulin Nph Isophane &  Regular Rash    Pt has this reaction to humalog & lantus when taken together   . Humalog Kwikpen [Insulin Lispro] Other (See Comments)    75/50---swelling and ulcers in mouth     Family History  Problem Relation Age of Onset  . High blood pressure Mother   . Diabetes Mother   . Arthritis Mother   . Hyperlipidemia Mother   . High blood pressure Father   . Hyperlipidemia Father   . High blood pressure Sister   . Diabetes Brother   . Hyperlipidemia Brother   . High blood pressure Brother      Prior to Admission medications   Medication Sig Start Date End Date Taking? Authorizing Provider  Alcohol Swabs 70 % PADS 1 application by Does not apply route 3 (three) times daily before meals. 06/06/16   Dhungel, Flonnie Overman, MD  apixaban (ELIQUIS) 5 MG TABS tablet Take 2 tablets (10 mg total) by mouth 2 (two) times daily for 7 days, THEN 1 tablet (5 mg total) 2 (two) times daily. 05/24/18 06/30/18  Mariel Aloe, MD  cetirizine (ZYRTEC) 10 MG tablet Take 10 mg by mouth daily. 11/14/17 11/14/18  [provider]  fluticasone (FLONASE) 50 MCG/ACT nasal spray Place 2 sprays into the nose daily as needed. 11/14/17 11/14/18  [provider]  glucose blood test strip Use as instructed 06/06/16   Dhungel, Nishant, MD  Insulin Degludec 200 UNIT/ML SOPN Inject 60-68 Units into the skin daily. 04/27/18   [provider]  insulin lispro (HUMALOG) 100 UNIT/ML injection Inject  0.06 mLs (6 Units total) into the skin 3 (three) times daily before meals. Patient taking differently: Inject 12 Units into the skin 3 (three) times daily before meals.  06/06/16   Dhungel, Nishant, MD  Insulin Syringe-Needle U-100 (INSULIN SYRINGE 1CC/28G) 28G X 1/2" 1 ML MISC 1 application by Does not apply route 3 (three) times daily before meals. 06/06/16   Dhungel, Flonnie Overman, MD  Multiple Vitamins-Minerals (CENTRUM ADULTS PO) Take 1 tablet by mouth daily.    [provider]  olmesartan (BENICAR) 20 MG tablet Take  20 mg by mouth daily. 03/20/18   [provider]  simvastatin (ZOCOR) 40 MG tablet Take 40 mg by mouth at bedtime. 03/28/16   [provider]    Physical Exam: Vitals:   03/10/19 0444 03/10/19 0445 03/10/19 0530 03/10/19 0651  BP:  (!) 181/95 (!) 172/78 (!) 190/96  Pulse:  83 78 87  Resp:  (!) 21 19 17   Temp:  98 F (36.7 C)    TempSrc:  Oral    SpO2:  98% 95% 100%  Weight:      Height: 5\' 5"  (1.651 m)         Constitutional: Obese pleasant female, sitting up in bed, alert awake oriented x3, no distress Vitals:   03/10/19 0444 03/10/19 0445 03/10/19 0530 03/10/19 0651  BP:  (!) 181/95 (!) 172/78 (!) 190/96  Pulse:  83 78 87  Resp:  (!) 21 19 17   Temp:  98 F (36.7 C)    TempSrc:  Oral    SpO2:  98% 95% 100%  Weight:      Height: 5\' 5"  (1.651 m)      Eyes: PERRL, lids and conjunctivae normal ENMT: Mucous membranes are moist.  Neck: Obese neck, unable to assess JVD Respiratory: Fine bilateral crackles noted Cardiovascular: Regular rate and rhythm, no murmurs / rubs / gallops Abdomen: soft, non tender, Bowel sounds positive.  Musculoskeletal: No joint deformity upper and lower extremities. Ext: 1+ edema, worse in left lower extremity Skin: no rashes, lesions, ulcers.  Neurologic: Moves all extremities, no localizing signs Psychiatric: Normal judgment and insight. Alert and oriented x 3. Normal mood.   Labs on Admission: I have personally reviewed following labs and imaging studies  CBC: Recent Labs  Lab 03/10/19 0614  WBC 7.1  NEUTROABS 4.8  HGB 6.8*  HCT 20.6*  MCV 112.0*  PLT 196   Basic Metabolic Panel: Recent Labs  Lab 03/10/19 0614  NA 142  K 3.3*  CL 107  CO2 27  GLUCOSE 156*  BUN 10  CREATININE 0.57  CALCIUM 8.7*   GFR: Estimated Creatinine Clearance: 128.1 mL/min (by C-G formula based on SCr of 0.57 mg/dL). Liver Function Tests: No results for input(s): AST, ALT, ALKPHOS, BILITOT, PROT, ALBUMIN in the last 168 hours. No  results for input(s): LIPASE, AMYLASE in the last 168 hours. No results for input(s): AMMONIA in the last 168 hours. Coagulation Profile: No results for input(s): INR, PROTIME in the last 168 hours. Cardiac Enzymes: No results for input(s): CKTOTAL, CKMB, CKMBINDEX, TROPONINI in the last 168 hours. BNP (last 3 results) No results for input(s): PROBNP in the last 8760 hours. HbA1C: No results for input(s): HGBA1C in the last 72 hours. CBG: No results for input(s): GLUCAP in the last 168 hours. Lipid Profile: No results for input(s): CHOL, HDL, LDLCALC, TRIG, CHOLHDL, LDLDIRECT in the last 72 hours. Thyroid Function Tests: No results for input(s): TSH, T4TOTAL, FREET4, T3FREE, THYROIDAB in the last  72 hours. Anemia Panel: Recent Labs    03/10/19 0736  VITAMINB12 <50*  FERRITIN 29  TIBC 236*  IRON 54  RETICCTPCT 5.4*   Urine analysis:    Component Value Date/Time   COLORURINE YELLOW 05/23/2018 1711   APPEARANCEUR CLEAR 05/23/2018 1711   LABSPEC >1.030 (H) 05/23/2018 1711   PHURINE 5.5 05/23/2018 1711   GLUCOSEU NEGATIVE 05/23/2018 1711   HGBUR SMALL (A) 05/23/2018 1711   BILIRUBINUR SMALL (A) 05/23/2018 1711   KETONESUR NEGATIVE 05/23/2018 1711   PROTEINUR 30 (A) 05/23/2018 1711   UROBILINOGEN 1.0 08/14/2009 0850   NITRITE NEGATIVE 05/23/2018 1711   LEUKOCYTESUR NEGATIVE 05/23/2018 1711   Sepsis Labs: @LABRCNTIP (procalcitonin:4,lacticidven:4) ) Recent Results (from the past 240 hour(s))  SARS Coronavirus 2 (CEPHEID - Performed in [place] hospital lab), Hosp Order     Status: None   Collection Time: 03/10/19  6:14 AM   Specimen: Nasopharyngeal Swab  Result Value Ref Range Status   SARS Coronavirus 2 NEGATIVE NEGATIVE Final    Comment: (NOTE) If result is NEGATIVE SARS-CoV-2 target nucleic acids are NOT DETECTED. The SARS-CoV-2 RNA is generally detectable in upper and lower  respiratory specimens during the acute phase of infection. The lowest  concentration of  SARS-CoV-2 viral copies this assay can detect is 250  copies / mL. A negative result does not preclude SARS-CoV-2 infection  and should not be used as the sole basis for treatment or other  patient management decisions.  A negative result may occur with  improper specimen collection / handling, submission of specimen other  than nasopharyngeal swab, presence of viral mutation(s) within the  areas targeted by this assay, and inadequate number of viral copies  (<250 copies / mL). A negative result must be combined with clinical  observations, patient history, and epidemiological information. If result is POSITIVE SARS-CoV-2 target nucleic acids are DETECTED. The SARS-CoV-2 RNA is generally detectable in upper and lower  respiratory specimens dur ing the acute phase of infection.  Positive  results are indicative of active infection with SARS-CoV-2.  Clinical  correlation with patient history and other diagnostic information is  necessary to determine patient infection status.  Positive results do  not rule out bacterial infection or co-infection with other viruses. If result is PRESUMPTIVE POSTIVE SARS-CoV-2 nucleic acids MAY BE PRESENT.   A presumptive positive result was obtained on the submitted specimen  and confirmed on repeat testing.  While 2019 novel coronavirus  (SARS-CoV-2) nucleic acids may be present in the submitted sample  additional confirmatory testing may be necessary for epidemiological  and / or clinical management purposes  to differentiate between  SARS-CoV-2 and other Sarbecovirus currently known to infect humans.  If clinically indicated additional testing with an alternate test  methodology [phone]) is advised. The SARS-CoV-2 RNA is generally  detectable in upper and lower respiratory sp ecimens during the acute  phase of infection. The expected result is Negative. Fact Sheet for Patients:  StrictlyIdeas.no Fact Sheet for Healthcare  Providers: BankingDealers.co.za This test is not yet approved or cleared by the [place] FDA and has been authorized for detection and/or diagnosis of SARS-CoV-2 by FDA under an Emergency Use Authorization (EUA).  This EUA will remain in effect (meaning this test can be used) for the duration of the COVID-19 declaration under Section 564(b)(1) of the Act, 21 U.S.C. section 360bbb-3(b)(1), unless the authorization is terminated or revoked sooner. Performed at [facility], [place] [address] [place], [place] [address]  Radiological Exams on Admission: Dg Chest 2 View  Result Date: 03/10/2019 CLINICAL DATA:  Shortness of breath for 2 days. Personal history of pulmonary embolus. EXAM: CHEST - 2 VIEW COMPARISON:  CTA chest and two-view chest x-ray 05/22/2018. FINDINGS: Heart size is upper limits of normal. This is exaggerated by low lung volumes. Bilateral airspace opacities are most prominent at the bases. Mild pulmonary vascular congestion is present. Small effusions are suspected. IMPRESSION: 1. Borderline cardiomegaly with mild pulmonary vascular congestion small effusions. This may reflect congestive heart failure. 2. Bibasilar airspace opacities may represent atelectasis. Infection with multi lobar pneumonia should also be considered. Electronically Signed   By: San Morelle M.D.   On: 03/10/2019 06:51    EKG: Independently reviewed.  Pending  Assessment/Plan  1.  Symptomatic anemia with an element of high-output congestive heart failure -1 unit of PRBC ordered, Lasix given, will repeat Lasix IV x2 doses today -She is followed by hematology for chronic iron deficiency anemia from blood loss, emphasized importance of continued follow-up for periodic IV iron infusions -Await anemia panel results, will consider IV iron infusion prior to discharge -Follow-up with hematology to determine if anticoagulation can be discontinued since she  has completed 11 months of this, she has a hematologist at [facility]  2.  Acute High-output CHF-from severe anemia, possibly also component of diastolic heart failure -Patient notes longstanding uncontrolled high blood pressure -Check 2D echocardiogram -IV Lasix x2 doses today with potassium -Monitor I/os, daily weights -Check electrolytes, potassium in a.m.  3.  Uncontrolled diabetes mellitus with hyperglycemia, with long-term current use of insulin (HCC) -Resume insulin degludec at slightly lower dose and lispro with meals -Sensitive sliding scale insulin -Emphasized importance of weight loss  4.  History of pulmonary embolism -Likely unprovoked from 7/201 9 -Remains on Xarelto, she is compliant with this, advised to discuss with hematology upon follow-up if this can be discontinued  5.  Uncontrolled hypertension -Likely worsened by volume overload -IV Lasix, ARB, monitor  6.  Chronic menorrhagia -Follow-up with GYN she has an appointment later this month  7. Morbid obesity -BMI is 48, lifestyle modification emphasized  DVT prophylaxis: Xarelto Code Status: Full code Family Communication: No family at bedside discussed with patient in detail Disposition Plan: Home likely in 48 hours Consults called: None Admission status: Inpatient  Domenic Polite MD [facility]   03/10/2019, 9:03 AM

## 2019-03-11 DIAGNOSIS — D519 Vitamin B12 deficiency anemia, unspecified: Secondary | ICD-10-CM | POA: Diagnosis present

## 2019-03-11 DIAGNOSIS — D500 Iron deficiency anemia secondary to blood loss (chronic): Secondary | ICD-10-CM | POA: Diagnosis not present

## 2019-03-11 DIAGNOSIS — I2699 Other pulmonary embolism without acute cor pulmonale: Secondary | ICD-10-CM

## 2019-03-11 DIAGNOSIS — I10 Essential (primary) hypertension: Secondary | ICD-10-CM

## 2019-03-11 DIAGNOSIS — E1165 Type 2 diabetes mellitus with hyperglycemia: Secondary | ICD-10-CM

## 2019-03-11 DIAGNOSIS — Z794 Long term (current) use of insulin: Secondary | ICD-10-CM

## 2019-03-11 LAB — TYPE AND SCREEN
ABO/RH(D): A POS
Antibody Screen: NEGATIVE
Unit division: 0

## 2019-03-11 LAB — BASIC METABOLIC PANEL
Anion gap: 8 (ref 5–15)
BUN: 12 mg/dL (ref 6–20)
CO2: 31 mmol/L (ref 22–32)
Calcium: 8.9 mg/dL (ref 8.9–10.3)
Chloride: 104 mmol/L (ref 98–111)
Creatinine, Ser: 0.58 mg/dL (ref 0.44–1.00)
GFR calc Af Amer: >60 mL/min (ref >60)
GFR calc non Af Amer: >60 mL/min (ref >60)
Glucose, Bld: 108 mg/dL — ABNORMAL HIGH (ref 70–99)
Potassium: 3.7 mmol/L (ref 3.5–5.1)
Sodium: 143 mmol/L (ref 135–145)

## 2019-03-11 LAB — BPAM RBC
Blood Product Expiration Date: 202006242359
ISSUE DATE / TIME: 202006111050
Unit Type and Rh: 6200

## 2019-03-11 LAB — CBC
HCT: 22.7 % — ABNORMAL LOW (ref 36.0–46.0)
Hemoglobin: 7.8 g/dL — ABNORMAL LOW (ref 12.0–15.0)
MCH: 35.9 pg — ABNORMAL HIGH (ref 26.0–34.0)
MCHC: 34.4 g/dL (ref 30.0–36.0)
MCV: 104.6 fL — ABNORMAL HIGH (ref 80.0–100.0)
Platelets: 291 10*3/uL (ref 150–400)
RBC: 2.17 MIL/uL — ABNORMAL LOW (ref 3.87–5.11)
RDW: 25.4 % — ABNORMAL HIGH (ref 11.5–15.5)
WBC: 7.5 10*3/uL (ref 4.0–10.5)
nRBC: 0 % (ref 0.0–0.2)

## 2019-03-11 LAB — GLUCOSE, CAPILLARY
Glucose-Capillary: 124 mg/dL — ABNORMAL HIGH (ref 70–99)
Glucose-Capillary: 108 mg/dL — ABNORMAL HIGH (ref 70–99)
Glucose-Capillary: 131 mg/dL — ABNORMAL HIGH (ref 70–99)

## 2019-03-11 MED ORDER — CYANOCOBALAMIN 1000 MCG PO TABS: 1000.0000 ug | ORAL_TABLET | Freq: Every day | ORAL | 2 refills | Status: DC

## 2019-03-11 MED ORDER — XARELTO 20 MG PO TABS: 20.0000 mg | ORAL_TABLET | Freq: Every day | ORAL | 0 refills | Status: DC

## 2019-03-11 MED ORDER — VITAMIN B-12 1000 MCG PO TABS
1000.0000 ug | ORAL_TABLET | Freq: Every day | ORAL | Status: DC
  Administered 2019-03-11: 1000 ug via ORAL
  Filled 2019-03-11: qty 1

## 2019-03-11 NOTE — Discharge Instructions (Signed)
Vitamin B12 Deficiency  Vitamin B12 deficiency occurs when the body does not have enough vitamin B12. Vitamin B12 is an important vitamin. The body needs vitamin B12:   To make red blood cells.   To make DNA. This is the genetic material inside cells.   To help the nerves work properly so they can carry messages from the brain to the body.  Vitamin B12 deficiency can cause various health problems, such as a low red blood cell count (anemia) or nerve damage.  What are the causes?  This condition may be caused by:   Not eating enough foods that contain vitamin B12.   Not having enough stomach acid and digestive fluids to properly absorb vitamin B12 from the food that you eat.   Certain digestive system diseases that make it hard to absorb vitamin B12. These diseases include Crohn disease, chronic pancreatitis, and cystic fibrosis.   Pernicious anemia. This is a condition in which the body does not make enough of a protein (intrinsic factor), resulting in too few red blood cells.   Having a surgery in which part of the stomach or small intestine is removed.   Taking certain medicines that make it hard for the body to absorb vitamin B12. These medicines include:  ? Heartburn medicine (antacids and proton pump inhibitors).  ? An antibiotic medicine called neomycin.  ? Some medicines that are used to treat diabetes, tuberculosis, gout, or high cholesterol.  What increases the risk?  The following factors may make you more likely to develop a B12 deficiency:   Being older than age 50.   Eating a vegetarian or vegan diet, especially while you are pregnant.   Eating a poor diet while you are pregnant.   Taking certain drugs.   Having alcoholism.  What are the signs or symptoms?  In some cases, there are no symptoms of this condition. If the condition leads to anemia or nerve damage, various symptoms can occur, such as:   Weakness.   Fatigue.   Loss of appetite.   Weight loss.   Numbness or tingling in your  hands and feet.   Redness and burning of the tongue.   Confusion or memory problems.   Depression.   Sensory problems, such as color blindness, ringing in the ears, or loss of taste.   Diarrhea or constipation.   Trouble walking.  If anemia is severe, symptoms can include:   Shortness of breath.   Dizziness.   Rapid heart rate (tachycardia).    How is this diagnosed?  This condition may be diagnosed with a blood test to measure the level of vitamin B12 in your blood. You may have other tests to help find the cause of your vitamin B12 deficiency. These tests may include:   A complete blood count (CBC). This is a group of tests that measure certain characteristics of blood cells.   A blood test to measure intrinsic factor.   An endoscopy. In this procedure, a thin tube with a camera on the end is used to look into your stomach or intestines.  How is this treated?  Treatment for this condition depends on the cause. Common treatment options include:   Changing your eating and drinking habits, such as:  ? Eating more foods that contain vitamin B12.  ? Drinking less alcohol or no alcohol.   Taking vitamin B12 supplements. Your health care provider will tell you which dosage is best for you.   Getting vitamin B12 injections.    Follow these instructions at home:   Take supplements only as told by your health care provider. Follow the directions carefully.   Get any injections that are prescribed by your health care provider.  Do not miss your appointments.   Eat lots of healthy foods that contain vitamin B12. Ask your health care provider if you should work with a dietitian. Foods that contain vitamin B12 include:  ? Meat.  ? Meat from birds (poultry).  ? Fish.  ? Eggs.  ? Cereal and dairy products that are fortified. This means that vitamin B12 has been added to the food. Check the label on the package to see if the food is fortified.   Do not abuse alcohol.   Keep all follow-up visits as told by your  health care provider. This is important.  Contact a health care provider if:   Your symptoms come back.  Get help right away if:   You develop shortness of breath.   You have chest pain.   You become dizzy or you lose consciousness.  This information is not intended to replace advice given to you by your health care provider. Make sure you discuss any questions you have with your health care provider.  Document Released: 12/08/2011 Document Revised: 02/27/2016 Document Reviewed: 01/31/2015  Elsevier Interactive Patient Education  2019 Elsevier Inc.

## 2019-03-11 NOTE — TOC Transition Note (Signed)
Transition of Care [facility]) - CM/SW Discharge Note   Patient Details  Name: Sherri Dixon MRN: [id number] Date of Birth: May 24, 1981  [facility] of [facility] CM/SW Contact:  Dessa Phi, RN Phone Number: 03/11/2019, 11:50 AM   Clinical Narrative:Provided w/pcp listing. No further CM needs.       Final next level of care: Home/Self Care Barriers to Discharge: No Barriers Identified   Patient Goals and CMS Choice Patient states their goals for this hospitalization and ongoing recovery are:: get better      Discharge Placement                       Discharge Plan and Services                                     Social Determinants of Health (SDOH) Interventions     Readmission Risk Interventions No flowsheet data found.

## 2019-03-11 NOTE — Plan of Care (Addendum)
Pt states that she feels much better and is able to ambulate w/o becoming SOB. Pt had a hypoglycemic episode. At 2113 pt's BS was 52. Pt was given OJ w/ Sugar. CBG @ 2141 was 60 and other hand was 56. Pt given more OJ w/ sugar. Pt's BS was 99 @ 2220 and @ 0305 it was 124.  Pt had 50 units of Lantus scheduled for 2200. Lantus was held. Pt BP 180/114 manual. On call paged.

## 2019-03-11 NOTE — Discharge Summary (Signed)
Physician Discharge Summary  Sherri Dixon UYQ:[id number] DOB: 1981/03/18 DOA: 03/10/2019  PCP: Sherri Dixon, No Pcp Per  Admit date: 03/10/2019 Discharge date: 03/11/2019  Admitted From: Home Disposition:  Home  Recommendations for Outpatient Follow-up:  1. Follow up with PCP in 1 week, continue to monitor BP closely; further antihypertensive titration 2. Please obtain CBC in one week 3. Follow-up with your primary hematologist, Dr. Marisue Brooklyn within 2 weeks 4. Newly started on vitamin B12 1000 mg p.o. daily for vitamin B12 level less than 50  Home Health: No Equipment/Devices: None  Discharge Condition: Stable CODE STATUS: Full code Diet recommendation: Heart Healthy / Carb Modified   History of present illness:  Sherri Dixon is a 38 y.o. female with medical history significant of obesity, type 2 diabetes mellitus, hypertension, chronic iron deficiency anemia from heavy menstrual blood loss, history of DVT/pulmonary embolism in 7/201 9 on Xarelto presented to the emergency room with shortness of breath since yesterday.  Sherri Dixon reports that she used to be on IV iron infusions, followed by hematologist at [facility] however has not gotten any iron since January, she also has had heavy menstrual bleeding since starting Xarelto last year, last couple of days she noticed acute worsening of dyspnea suspected that she may have developed another blood clot despite taking Xarelto, hence presented to the emergency room today.  She is also had chronic left lower extremity edema for the past year ever since her DVT however has also noticed swelling in her right leg as well for several weeks to months, her primary care doctor started her on low-dose Lasix not too long ago which she has been compliant with.  In the emergency room she was noted to have worsening chronic anemia with hemoglobin of 6.8, potassium was 3.3, chest x-ray was concerning for pulmonary edema  Hospital course:  Symptomatic anemia Hx iron  deficiency anemia B12 deficiency anemia Sherri Dixon presenting with progressive shortness of breath.  History of iron deficiency anemia in which she received IV infusions in the past, none since January.  Follows with hematology at [facility], Dr. Marylou Mccoy.  Hemoglobin on admission 6.8 with MCV of 112..  Iron studies remarkable for iron 54, TIBC 236, ferritin 29, folate 32.8, vitamin B12 less than 50, reticulocyte count 5.4.  Sherri Dixon was transfused 1 unit PRBCs with expected rise in her hemoglobin to 7.8.  Sherri Dixon symptoms have resolved.  Given her low vitamin B12 level, she will start on vitamin B12 supplementation 1000 mg p.o. daily.  Sherri Dixon instructed to follow-up with her hematologist for further evaluation and treatment if needed.  Type 2 diabetes mellitus Hemoglobin A1c 7.1, well controlled.  Continue home regimen.  Essential hypertension Continue home Benicar.  Recommend Sherri Dixon obtain a blood pressure cuff and maintain a daily log to bring to next PCP visit.  May need further titration of her antihypertensives.  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  Currently on Xarelto.  Sherri Dixon with history of heavy menstrual bleeding.  Discussed with Sherri Dixon needs to talk with her hematologist regarding possible discontinuation as she has been on therapy for approximately 1 year.  Morbid obesity BMI 51.74.  Discussed with Sherri Dixon need for aggressive weight loss measures as this complicates all facets of care.   Discharge Diagnoses:  Active Problems:   Uncontrolled diabetes mellitus with hyperglycemia, with long-term current use of insulin (HCC)   Pulmonary emboli (HCC)   Obesity, Class III, BMI 40-49.9 (morbid obesity) (HCC)   Iron deficiency anemia due to  chronic blood loss   Anemia   B12 deficiency anemia    Discharge Instructions  Discharge Instructions    Call MD for:  difficulty breathing, headache or visual disturbances   Complete by: As directed    Call MD  for:  extreme fatigue   Complete by: As directed    Call MD for:  persistant dizziness or light-headedness   Complete by: As directed    Call MD for:  persistant nausea and vomiting   Complete by: As directed    Call MD for:  severe uncontrolled pain   Complete by: As directed    Call MD for:  temperature >100.4   Complete by: As directed    Diet - low sodium heart healthy   Complete by: As directed    Increase activity slowly   Complete by: As directed      Allergies as of 03/11/2019      Reactions   Humalog Kwikpen [insulin Lispro] Other (See Comments)   75/50---swelling and ulcers in mouth       Medication List    STOP taking these medications   insulin lispro 100 UNIT/ML injection Commonly known as: HumaLOG     TAKE these medications   Alcohol Swabs 70 % Pads 1 application by Does not apply route 3 (three) times daily before meals.   Basaglar KwikPen 100 UNIT/ML Sopn Inject 50 Units into the skin daily.   CENTRUM ADULTS PO Take 1 tablet by mouth daily.   cyanocobalamin 1000 MCG tablet Take 1 tablet (1,000 mcg total) by mouth daily. Start taking on: March 12, 2019   glucose blood test strip Use as instructed   INSULIN SYRINGE 1CC/28G 28G X 1/2" 1 ML Misc 1 application by Does not apply route 3 (three) times daily before meals.   NovoLOG FlexPen 100 UNIT/ML FlexPen Generic drug: insulin aspart Inject 6 Units as directed 3 (three) times daily.   olmesartan 20 MG tablet Commonly known as: BENICAR Take 20 mg by mouth daily.   simvastatin 40 MG tablet Commonly known as: ZOCOR Take 40 mg by mouth at bedtime.   Xarelto 20 MG Tabs tablet Generic drug: rivaroxaban Take 1 tablet (20 mg total) by mouth daily for 30 days.      Follow-up Information    Ala Dach, MD. Schedule an appointment as soon as possible for a visit in 2 week(s).   Specialty: Hematology and Oncology Contact information: [address] [place]  [address] [phone]          Allergies  Allergen Reactions  . Humalog Kwikpen [Insulin Lispro] Other (See Comments)    75/50---swelling and ulcers in mouth     Consultations:  none   Procedures/Studies: Dg Chest 2 View  Result Date: 03/10/2019 CLINICAL DATA:  Shortness of breath for 2 days. Personal history of pulmonary embolus. EXAM: CHEST - 2 VIEW COMPARISON:  CTA chest and two-view chest x-ray 05/22/2018. FINDINGS: Heart size is upper limits of normal. This is exaggerated by low lung volumes. Bilateral airspace opacities are most prominent at the bases. Mild pulmonary vascular congestion is present. Small effusions are suspected. IMPRESSION: 1. Borderline cardiomegaly with mild pulmonary vascular congestion small effusions. This may reflect congestive heart failure. 2. Bibasilar airspace opacities may represent atelectasis. Infection with multi lobar pneumonia should also be considered. Electronically Signed   By: San Morelle M.D.   On: 03/10/2019 06:51      Subjective: Sherri Dixon seen and examined at bedside, resting  comfortably.  States ready for discharge home.  Shortness of breath and weakness have remarkably resolved following transfusion yesterday.  No other complaints or concerns at this time.  Denies headache, no fever/chills/night sweats, no chest pain, no shortness of breath, no palpitations, no abdominal pain, no cough/congestion, no nausea/vomiting/diarrhea.  No acute events overnight per nursing staff.   Discharge Exam: Vitals:   03/11/19 0512 03/11/19 0528  BP: (!) 174/101 (!) 180/114  Pulse: 73   Resp: 17   Temp: 98.5 F (36.9 C)   SpO2: 96%    Vitals:   03/10/19 1329 03/10/19 2111 03/11/19 0512 03/11/19 0528  BP: (!) 170/87 (!) 165/108 (!) 174/101 (!) 180/114  Pulse: 81 72 73   Resp: 16 16 17    Temp: 98.5 F (36.9 C) 98.5 F (36.9 C) 98.5 F (36.9 C)   TempSrc: Oral Oral Oral   SpO2: 100% 100% 96%   Weight:   (!) 141 kg   Height:         General: Pt is alert, awake, not in acute distress, obese Cardiovascular: RRR, S1/S2 +, no rubs, no gallops Respiratory: CTA bilaterally, no wheezing, no rhonchi Abdominal: Soft, NT, ND, bowel sounds + Extremities: no edema, no cyanosis    The results of significant diagnostics from this hospitalization (including imaging, microbiology, ancillary and laboratory) are listed below for reference.     Microbiology: Recent Results (from the past 240 hour(s))  SARS Coronavirus 2 (CEPHEID - Performed in [place] hospital lab), Hosp Order     Status: None   Collection Time: 03/10/19  6:14 AM   Specimen: Nasopharyngeal Swab  Result Value Ref Range Status   SARS Coronavirus 2 NEGATIVE NEGATIVE Final    Comment: (NOTE) If result is NEGATIVE SARS-CoV-2 target nucleic acids are NOT DETECTED. The SARS-CoV-2 RNA is generally detectable in upper and lower  respiratory specimens during the acute phase of infection. The lowest  concentration of SARS-CoV-2 viral copies this assay can detect is 250  copies / mL. A negative result does not preclude SARS-CoV-2 infection  and should not be used as the sole basis for treatment or other  Sherri Dixon management decisions.  A negative result may occur with  improper specimen collection / handling, submission of specimen other  than nasopharyngeal swab, presence of viral mutation(s) within the  areas targeted by this assay, and inadequate number of viral copies  (<250 copies / mL). A negative result must be combined with clinical  observations, Sherri Dixon history, and epidemiological information. If result is POSITIVE SARS-CoV-2 target nucleic acids are DETECTED. The SARS-CoV-2 RNA is generally detectable in upper and lower  respiratory specimens dur ing the acute phase of infection.  Positive  results are indicative of active infection with SARS-CoV-2.  Clinical  correlation with Sherri Dixon history and other diagnostic information is  necessary to  determine Sherri Dixon infection status.  Positive results do  not rule out bacterial infection or co-infection with other viruses. If result is PRESUMPTIVE POSTIVE SARS-CoV-2 nucleic acids MAY BE PRESENT.   A presumptive positive result was obtained on the submitted specimen  and confirmed on repeat testing.  While 2019 novel coronavirus  (SARS-CoV-2) nucleic acids may be present in the submitted sample  additional confirmatory testing may be necessary for epidemiological  and / or clinical management purposes  to differentiate between  SARS-CoV-2 and other Sarbecovirus currently known to infect humans.  If clinically indicated additional testing with an alternate test  methodology [phone]) is advised. The SARS-CoV-2 RNA is  generally  detectable in upper and lower respiratory sp ecimens during the acute  phase of infection. The expected result is Negative. Fact Sheet for Patients:  StrictlyIdeas.no Fact Sheet for Healthcare Providers: BankingDealers.co.za This test is not yet approved or cleared by the [place] FDA and has been authorized for detection and/or diagnosis of SARS-CoV-2 by FDA under an Emergency Use Authorization (EUA).  This EUA will remain in effect (meaning this test can be used) for the duration of the COVID-19 declaration under Section 564(b)(1) of the Act, 21 U.S.C. section 360bbb-3(b)(1), unless the authorization is terminated or revoked sooner. Performed at [facility], [place] [address], [place], [place] [address]      Labs: BNP (last 3 results) Recent Labs    05/22/18 1050 03/10/19 0614  BNP 111.8* 932.3*   Basic Metabolic Panel: Recent Labs  Lab 03/10/19 0614 03/11/19 0702  NA 142 143  K 3.3* 3.7  CL 107 104  CO2 27 31  GLUCOSE 156* 108*  BUN 10 12  CREATININE 0.57 0.58  CALCIUM 8.7* 8.9   Liver Function Tests: No results for input(s): AST, ALT, ALKPHOS, BILITOT, PROT,  ALBUMIN in the last 168 hours. No results for input(s): LIPASE, AMYLASE in the last 168 hours. No results for input(s): AMMONIA in the last 168 hours. CBC: Recent Labs  Lab 03/10/19 0614 03/11/19 0702  WBC 7.1 7.5  NEUTROABS 4.8  --   HGB 6.8* 7.8*  HCT 20.6* 22.7*  MCV 112.0* 104.6*  PLT 291 291   Cardiac Enzymes: No results for input(s): CKTOTAL, CKMB, CKMBINDEX, TROPONINI in the last 168 hours. BNP: Invalid input(s): POCBNP CBG: Recent Labs  Lab 03/10/19 2141 03/10/19 2145 03/10/19 2220 03/11/19 0307 03/11/19 0809  GLUCAP 60* 56* 99 124* 108*   D-Dimer No results for input(s): DDIMER in the last 72 hours. Hgb A1c Recent Labs    03/10/19 0730  HGBA1C 7.1*   Lipid Profile No results for input(s): CHOL, HDL, LDLCALC, TRIG, CHOLHDL, LDLDIRECT in the last 72 hours. Thyroid function studies Recent Labs    03/10/19 0730  TSH 3.344   Anemia work up Recent Labs    03/10/19 0736  VITAMINB12 <50*  FOLATE 32.8  FERRITIN 29  TIBC 236*  IRON 54  RETICCTPCT 5.4*   Urinalysis    Component Value Date/Time   COLORURINE YELLOW 05/23/2018 1711   APPEARANCEUR CLEAR 05/23/2018 1711   LABSPEC >1.030 (H) 05/23/2018 1711   PHURINE 5.5 05/23/2018 1711   GLUCOSEU NEGATIVE 05/23/2018 1711   HGBUR SMALL (A) 05/23/2018 1711   BILIRUBINUR SMALL (A) 05/23/2018 1711   KETONESUR NEGATIVE 05/23/2018 1711   PROTEINUR 30 (A) 05/23/2018 1711   UROBILINOGEN 1.0 08/14/2009 0850   NITRITE NEGATIVE 05/23/2018 1711   LEUKOCYTESUR NEGATIVE 05/23/2018 1711   Sepsis Labs Invalid input(s): PROCALCITONIN,  WBC,  LACTICIDVEN Microbiology Recent Results (from the past 240 hour(s))  SARS Coronavirus 2 (CEPHEID - Performed in [place] hospital lab), Hosp Order     Status: None   Collection Time: 03/10/19  6:14 AM   Specimen: Nasopharyngeal Swab  Result Value Ref Range Status   SARS Coronavirus 2 NEGATIVE NEGATIVE Final    Comment: (NOTE) If result is NEGATIVE SARS-CoV-2 target  nucleic acids are NOT DETECTED. The SARS-CoV-2 RNA is generally detectable in upper and lower  respiratory specimens during the acute phase of infection. The lowest  concentration of SARS-CoV-2 viral copies this assay can detect is 250  copies / mL. A negative result does  not preclude SARS-CoV-2 infection  and should not be used as the sole basis for treatment or other  Sherri Dixon management decisions.  A negative result may occur with  improper specimen collection / handling, submission of specimen other  than nasopharyngeal swab, presence of viral mutation(s) within the  areas targeted by this assay, and inadequate number of viral copies  (<250 copies / mL). A negative result must be combined with clinical  observations, Sherri Dixon history, and epidemiological information. If result is POSITIVE SARS-CoV-2 target nucleic acids are DETECTED. The SARS-CoV-2 RNA is generally detectable in upper and lower  respiratory specimens dur ing the acute phase of infection.  Positive  results are indicative of active infection with SARS-CoV-2.  Clinical  correlation with Sherri Dixon history and other diagnostic information is  necessary to determine Sherri Dixon infection status.  Positive results do  not rule out bacterial infection or co-infection with other viruses. If result is PRESUMPTIVE POSTIVE SARS-CoV-2 nucleic acids MAY BE PRESENT.   A presumptive positive result was obtained on the submitted specimen  and confirmed on repeat testing.  While 2019 novel coronavirus  (SARS-CoV-2) nucleic acids may be present in the submitted sample  additional confirmatory testing may be necessary for epidemiological  and / or clinical management purposes  to differentiate between  SARS-CoV-2 and other Sarbecovirus currently known to infect humans.  If clinically indicated additional testing with an alternate test  methodology [phone]) is advised. The SARS-CoV-2 RNA is generally  detectable in upper and lower  respiratory sp ecimens during the acute  phase of infection. The expected result is Negative. Fact Sheet for Patients:  StrictlyIdeas.no Fact Sheet for Healthcare Providers: BankingDealers.co.za This test is not yet approved or cleared by the [place] FDA and has been authorized for detection and/or diagnosis of SARS-CoV-2 by FDA under an Emergency Use Authorization (EUA).  This EUA will remain in effect (meaning this test can be used) for the duration of the COVID-19 declaration under Section 564(b)(1) of the Act, 21 U.S.C. section 360bbb-3(b)(1), unless the authorization is terminated or revoked sooner. Performed at [facility], [place] [address], [place],  [address]      Time coordinating discharge: Over 30 minutes  SIGNED:   Shanen Norris J [place] DO  [facility] 03/11/2019, 10:19 AM

## 2019-03-11 NOTE — Progress Notes (Signed)
Pt discharged home today per Dr. [place]. Pt's IV site D/C'd and WDL. Pt's VSS. Pt provided with home medication list, discharge instructions and prescriptions. Verbalized understanding. Pt left floor via WC in stable condition accompanied by NT. 

## 2019-04-05 MED ORDER — PERFLUTREN LIPID MICROSPHERE: 1.0000 mL | INTRAVENOUS | Status: AC | PRN

## 2019-04-12 ENCOUNTER — Other Ambulatory Visit: Payer: Self-pay | Admitting: Cardiology

## 2019-04-12 ENCOUNTER — Other Ambulatory Visit (HOSPITAL_COMMUNITY): Payer: Self-pay | Admitting: Cardiology

## 2019-04-12 DIAGNOSIS — R079 Chest pain, unspecified: Secondary | ICD-10-CM

## 2019-04-25 ENCOUNTER — Other Ambulatory Visit: Payer: Self-pay

## 2019-04-25 ENCOUNTER — Ambulatory Visit (HOSPITAL_COMMUNITY)
Admission: RE | Admit: 2019-04-25 | Discharge: 2019-04-25 | Disposition: A | Discharge status: Home / Self Care | Payer: 59 | Source: Ambulatory Visit | Attending: Cardiology | Admitting: Cardiology

## 2019-04-25 DIAGNOSIS — R079 Chest pain, unspecified: Secondary | ICD-10-CM | POA: Diagnosis not present

## 2019-04-25 MED ORDER — TECHNETIUM TC 99M TETROFOSMIN IV KIT
30.0000 | PACK | Freq: Once | INTRAVENOUS | Status: AC | PRN
  Administered 2019-04-25: 30 via INTRAVENOUS

## 2019-04-25 MED ORDER — REGADENOSON 0.4 MG/5ML IV SOLN
INTRAVENOUS | Status: AC
  Filled 2019-04-25: qty 5

## 2019-04-25 MED ORDER — REGADENOSON 0.4 MG/5ML IV SOLN
0.4000 mg | Freq: Once | INTRAVENOUS | Status: AC
  Administered 2019-04-25: 0.4 mg via INTRAVENOUS

## 2019-04-26 ENCOUNTER — Encounter (HOSPITAL_COMMUNITY)
Admission: RE | Admit: 2019-04-26 | Discharge: 2019-04-26 | Disposition: A | Discharge status: Home / Self Care | Payer: 59 | Source: Ambulatory Visit | Attending: Cardiology | Admitting: Cardiology

## 2019-04-26 DIAGNOSIS — R079 Chest pain, unspecified: Secondary | ICD-10-CM | POA: Insufficient documentation

## 2019-04-26 MED ORDER — TECHNETIUM TC 99M TETROFOSMIN IV KIT
30.0000 | PACK | Freq: Once | INTRAVENOUS | Status: AC | PRN
  Administered 2019-04-26: 08:00:00 30 via INTRAVENOUS

## 2019-06-29 ENCOUNTER — Other Ambulatory Visit: Payer: Self-pay | Admitting: Urology

## 2019-07-18 NOTE — Progress Notes (Signed)
PCP - Jorje Guild Cardiologist -   Chest x-ray - 03-10-19 EKG - 03-11-19 Stress Test - 03-2719  ECHO - 03-10-19  Cardiac Cath -   Sleep Study -  CPAP -   Fasting Blood Sugar - 265 Checks Blood Sugar 2-3 times a day  Blood Thinner Instructions: Xarelto to hold x 72 hours Aspirin Instructions: Last Dose:  Anesthesia review:   Patient denies shortness of breath, fever, cough and chest pain at PAT appointment   Patient verbalized understanding of instructions that were given to them at the PAT appointment. Patient was also instructed that they will need to review over the PAT instructions again at home before surgery.

## 2019-07-18 NOTE — Patient Instructions (Addendum)
DUE TO COVID-19 ONLY ONE VISITOR IS ALLOWED TO COME WITH YOU AND STAY IN THE WAITING ROOM ONLY DURING PRE OP AND PROCEDURE DAY OF SURGERY. THE 1 VISITOR MAY VISIT WITH YOU AFTER SURGERY IN YOUR PRIVATE ROOM DURING VISITING HOURS ONLY!  YOU NEED TO HAVE A COVID 19 TEST ON 07-25-19  @ 2:50 PM, THIS TEST MUST BE DONE BEFORE SURGERY, COME  [place], [place] [address] , [address].  ([place]) ONCE YOUR COVID TEST IS COMPLETED, PLEASE BEGIN THE QUARANTINE INSTRUCTIONS AS OUTLINED IN YOUR HANDOUT.                Sherri Dixon  07/18/2019   Your procedure is scheduled on: 07-28-19    Report to [facility] Main  Entrance    Report to Short Stay at 5:30 AM     Call this number if you have problems the morning of surgery [phone]     PLEASE CONSUME A CLEAR LIQUID DIET PRIOR TO SURGERY   Remember: NO SOLID FOOD AFTER MIDNIGHT THE NIGHT PRIOR TO SURGERY. NOTHING BY MOUTH EXCEPT CLEAR LIQUIDS UNTIL 4:30 AM . PLEASE FINISH G2 DRINK PER SURGEON ORDER  WHICH NEEDS TO BE COMPLETED AT 4:30 AM.      CLEAR LIQUID DIET   Foods Allowed                                                                     Foods Excluded  Coffee and tea, regular and decaf                             liquids that you cannot  Plain Jell-O any favor except red or purple                                           see through such as: Fruit ices (not with fruit pulp)                                     milk, soups, orange juice  Iced Popsicles                                    All solid food Carbonated beverages, regular and diet                                    Cranberry, grape and apple juices Sports drinks like Gatorade Lightly seasoned clear broth or consume(fat free) Sugar, honey syrup  Sample Menu Breakfast                                Lunch  Supper Cranberry juice                    Beef broth                            Chicken broth Jell-O                                      Grape juice                           Apple juice Coffee or tea                        Jell-O                                      Popsicle                                                Coffee or tea                        Coffee or tea  _____________________________________________________________________    Take these medicines the morning of surgery with A SIP OF WATER: Gabapentin (Neurontin) and Cetirizine (Zyrtec), Metoprolol   BRUSH YOUR TEETH MORNING OF SURGERY AND RINSE YOUR MOUTH OUT, NO CHEWING GUM CANDY OR MINTS.   DO NOT TAKE ANY DIABETIC MEDICATIONS DAY OF YOUR SURGERY                               You may not have any metal on your body including hair pins and              piercings    Do not wear jewelry, make-up, lotions, powders or perfumes, deodorant              Do not wear nail polish on your fingernails.  Do not shave  48 hours prior to surgery.     Do not bring valuables to the hospital. [place].  Contacts, dentures or bridgework may not be worn into surgery.  Leave suitcase in the car. After surgery it may be brought to your room.     Special Instructions: N/A              Please read over the following fact sheets you were given: _____________________________________________________________________ How to Manage Your Diabetes Before and After Surgery  Why is it important to control my blood sugar before and after surgery? . Improving blood sugar levels before and after surgery helps healing and can limit problems. . A way of improving blood sugar control is eating a healthy diet by: o  Eating less sugar and carbohydrates o  Increasing activity/exercise o  Talking with your doctor about reaching your blood sugar goals . High blood sugars (greater than 180 mg/dL) can raise your risk of infections and slow your recovery, so  you will need to focus on controlling your diabetes  during the weeks before surgery. . Make sure that the doctor who takes care of your diabetes knows about your planned surgery including the date and location.  How do I manage my blood sugar before surgery? . Check your blood sugar at least 4 times a day, starting 2 days before surgery, to make sure that the level is not too high or low. o Check your blood sugar the morning of your surgery when you wake up and every 2 hours until you get to the Short Stay unit. . If your blood sugar is less than 70 mg/dL, you will need to treat for low blood sugar: o Do not take insulin. o Treat a low blood sugar (less than 70 mg/dL) with  cup of clear juice (cranberry or apple), 4 glucose tablets, OR glucose gel. o Recheck blood sugar in 15 minutes after treatment (to make sure it is greater than 70 mg/dL). If your blood sugar is not greater than 70 mg/dL on recheck, call [phone] for further instructions. . Report your blood sugar to the short stay nurse when you get to Short Stay.  . If you are admitted to the hospital after surgery: o Your blood sugar will be checked by the staff and you will probably be given insulin after surgery (instead of oral diabetes medicines) to make sure you have good blood sugar levels. o The goal for blood sugar control after surgery is 80-180 mg/dL.   WHAT DO I DO ABOUT MY DIABETES MEDICATION?  Marland Kitchen Do not take oral diabetes medicines (pills) the morning of surgery.  . THE DAY BEFORE SURGERY,you can take your usual 12-20 U of Novolog, but only  take 1/2  of your Basaglar Insulin at Bedtime .       Marland Kitchen The day of surgery, do not take other diabetes injectables, including Byetta (exenatide), Bydureon (exenatide ER), Victoza (liraglutide), or Trulicity (dulaglutide).  . If your CBG is greater than 220 mg/dL, you may take  of your sliding scale  . (correction) dose of insulin.      Reviewed and Endorsed by [facility] Patient Education Committee, August 2015             [facility] - Preparing for Surgery Before surgery, you can play an important role.  Because skin is not sterile, your skin needs to be as free of germs as possible.  You can reduce the number of germs on your skin by washing with CHG (chlorahexidine gluconate) soap before surgery.  CHG is an antiseptic cleaner which kills germs and bonds with the skin to continue killing germs even after washing. Please DO NOT use if you have an allergy to CHG or antibacterial soaps.  If your skin becomes reddened/irritated stop using the CHG and inform your nurse when you arrive at Short Stay. Do not shave (including legs and underarms) for at least 48 hours prior to the first CHG shower.  You may shave your face/neck. Please follow these instructions carefully:  1.  Shower with CHG Soap the night before surgery and the  morning of Surgery.  2.  If you choose to wash your hair, wash your hair first as usual with your  normal  shampoo.  3.  After you shampoo, rinse your hair and body thoroughly to remove the  shampoo.  4.  Use CHG as you would any other liquid soap.  You can apply chg directly  to the skin and wash                       Gently with a scrungie or clean washcloth.  5.  Apply the CHG Soap to your body ONLY FROM THE NECK DOWN.   Do not use on face/ open                           Wound or open sores. Avoid contact with eyes, ears mouth and genitals (private parts).                       Wash face,  Genitals (private parts) with your normal soap.             6.  Wash thoroughly, paying special attention to the area where your surgery  will be performed.  7.  Thoroughly rinse your body with warm water from the neck down.  8.  DO NOT shower/wash with your normal soap after using and rinsing off  the CHG Soap.                9.  Pat yourself dry with a clean towel.            10.  Wear clean pajamas.            11.  Place clean sheets on your bed the night of your first shower and  do not  sleep with pets. Day of Surgery : Do not apply any lotions/deodorants the morning of surgery.  Please wear clean clothes to the hospital/surgery center.  FAILURE TO FOLLOW THESE INSTRUCTIONS MAY RESULT IN THE CANCELLATION OF YOUR SURGERY PATIENT SIGNATURE_________________________________  NURSE SIGNATURE__________________________________  ________________________________________________________________________

## 2019-07-19 ENCOUNTER — Encounter (INDEPENDENT_AMBULATORY_CARE_PROVIDER_SITE_OTHER): Payer: Self-pay

## 2019-07-19 ENCOUNTER — Other Ambulatory Visit: Payer: Self-pay

## 2019-07-19 ENCOUNTER — Encounter (HOSPITAL_COMMUNITY)
Admission: RE | Admit: 2019-07-19 | Discharge: 2019-07-19 | Disposition: A | Discharge status: Home / Self Care | Payer: 59 | Source: Ambulatory Visit | Attending: Urology | Admitting: Urology

## 2019-07-19 ENCOUNTER — Encounter (HOSPITAL_COMMUNITY): Payer: Self-pay

## 2019-07-19 DIAGNOSIS — Z01812 Encounter for preprocedural laboratory examination: Secondary | ICD-10-CM | POA: Insufficient documentation

## 2019-07-19 DIAGNOSIS — N2889 Other specified disorders of kidney and ureter: Secondary | ICD-10-CM | POA: Insufficient documentation

## 2019-07-19 LAB — CBC
HCT: 31 % — ABNORMAL LOW (ref 36.0–46.0)
Hemoglobin: 9.2 g/dL — ABNORMAL LOW (ref 12.0–15.0)
MCH: 23.7 pg — ABNORMAL LOW (ref 26.0–34.0)
MCHC: 29.7 g/dL — ABNORMAL LOW (ref 30.0–36.0)
MCV: 79.7 fL — ABNORMAL LOW (ref 80.0–100.0)
Platelets: 361 10*3/uL (ref 150–400)
RBC: 3.89 MIL/uL (ref 3.87–5.11)
RDW: 14.5 % (ref 11.5–15.5)
WBC: 11.5 10*3/uL — ABNORMAL HIGH (ref 4.0–10.5)
nRBC: 0 % (ref 0.0–0.2)

## 2019-07-19 LAB — BASIC METABOLIC PANEL
Anion gap: 8 (ref 5–15)
BUN: 16 mg/dL (ref 6–20)
CO2: 24 mmol/L (ref 22–32)
Calcium: 9.2 mg/dL (ref 8.9–10.3)
Chloride: 105 mmol/L (ref 98–111)
Creatinine, Ser: 0.75 mg/dL (ref 0.44–1.00)
GFR calc Af Amer: >60 mL/min (ref >60)
GFR calc non Af Amer: >60 mL/min (ref >60)
Glucose, Bld: 280 mg/dL — ABNORMAL HIGH (ref 70–99)
Potassium: 4.1 mmol/L (ref 3.5–5.1)
Sodium: 137 mmol/L (ref 135–145)

## 2019-07-19 LAB — GLUCOSE, CAPILLARY: Glucose-Capillary: 265 mg/dL — ABNORMAL HIGH (ref 70–99)

## 2019-07-19 LAB — HEMOGLOBIN A1C
Hgb A1c MFr Bld: 10.7 % — ABNORMAL HIGH (ref 4.8–5.6)
Mean Plasma Glucose: 260.39 mg/dL

## 2019-07-19 NOTE — Progress Notes (Addendum)
HGA1C routed to Dr. Alyson Ingles for review, and reviewed by Konrad Felix, PA. Janett Billow to contact surgeon's office to discuss the results.

## 2019-07-25 ENCOUNTER — Other Ambulatory Visit (HOSPITAL_COMMUNITY)
Admission: RE | Admit: 2019-07-25 | Discharge: 2019-07-25 | Disposition: A | Discharge status: Home / Self Care | Payer: 59 | Source: Ambulatory Visit | Attending: Urology | Admitting: Urology

## 2019-07-25 DIAGNOSIS — Z01812 Encounter for preprocedural laboratory examination: Secondary | ICD-10-CM | POA: Insufficient documentation

## 2019-07-25 DIAGNOSIS — Z20828 Contact with and (suspected) exposure to other viral communicable diseases: Secondary | ICD-10-CM | POA: Insufficient documentation

## 2019-07-26 LAB — NOVEL CORONAVIRUS, NAA (HOSP ORDER, SEND-OUT TO REF LAB; TAT 18-24 HRS): SARS-CoV-2, NAA: NOT DETECTED

## 2019-07-27 ENCOUNTER — Encounter (HOSPITAL_COMMUNITY): Payer: Self-pay | Admitting: Anesthesiology

## 2019-07-27 ENCOUNTER — Other Ambulatory Visit: Payer: Self-pay

## 2019-07-27 ENCOUNTER — Inpatient Hospital Stay (HOSPITAL_COMMUNITY)
Admission: RE | Admit: 2019-07-27 | Discharge: 2019-07-31 | DRG: 657 | Disposition: A | Discharge status: Home / Self Care | Payer: 59 | Attending: Urology | Admitting: Urology

## 2019-07-27 DIAGNOSIS — D62 Acute posthemorrhagic anemia: Secondary | ICD-10-CM | POA: Diagnosis not present

## 2019-07-27 DIAGNOSIS — D649 Anemia, unspecified: Secondary | ICD-10-CM | POA: Diagnosis present

## 2019-07-27 DIAGNOSIS — Z86711 Personal history of pulmonary embolism: Secondary | ICD-10-CM | POA: Diagnosis not present

## 2019-07-27 DIAGNOSIS — D6489 Other specified anemias: Secondary | ICD-10-CM | POA: Diagnosis not present

## 2019-07-27 DIAGNOSIS — N2889 Other specified disorders of kidney and ureter: Secondary | ICD-10-CM | POA: Diagnosis present

## 2019-07-27 DIAGNOSIS — E119 Type 2 diabetes mellitus without complications: Secondary | ICD-10-CM | POA: Diagnosis present

## 2019-07-27 DIAGNOSIS — Z20828 Contact with and (suspected) exposure to other viral communicable diseases: Secondary | ICD-10-CM | POA: Diagnosis present

## 2019-07-27 DIAGNOSIS — Z79899 Other long term (current) drug therapy: Secondary | ICD-10-CM

## 2019-07-27 DIAGNOSIS — R0602 Shortness of breath: Secondary | ICD-10-CM

## 2019-07-27 DIAGNOSIS — I10 Essential (primary) hypertension: Secondary | ICD-10-CM | POA: Diagnosis present

## 2019-07-27 DIAGNOSIS — E785 Hyperlipidemia, unspecified: Secondary | ICD-10-CM | POA: Diagnosis present

## 2019-07-27 DIAGNOSIS — E669 Obesity, unspecified: Secondary | ICD-10-CM | POA: Diagnosis present

## 2019-07-27 DIAGNOSIS — Z6841 Body Mass Index (BMI) 40.0 and over, adult: Secondary | ICD-10-CM | POA: Diagnosis not present

## 2019-07-27 DIAGNOSIS — J45909 Unspecified asthma, uncomplicated: Secondary | ICD-10-CM | POA: Diagnosis present

## 2019-07-27 DIAGNOSIS — C641 Malignant neoplasm of right kidney, except renal pelvis: Principal | ICD-10-CM | POA: Diagnosis present

## 2019-07-27 DIAGNOSIS — K219 Gastro-esophageal reflux disease without esophagitis: Secondary | ICD-10-CM | POA: Diagnosis present

## 2019-07-27 DIAGNOSIS — D638 Anemia in other chronic diseases classified elsewhere: Secondary | ICD-10-CM | POA: Diagnosis present

## 2019-07-27 LAB — GLUCOSE, CAPILLARY
Glucose-Capillary: 50 mg/dL — ABNORMAL LOW (ref 70–99)
Glucose-Capillary: 67 mg/dL — ABNORMAL LOW (ref 70–99)
Glucose-Capillary: 71 mg/dL (ref 70–99)

## 2019-07-27 LAB — HIV ANTIBODY (ROUTINE TESTING W REFLEX): HIV Screen 4th Generation wRfx: NONREACTIVE

## 2019-07-27 LAB — MRSA PCR SCREENING: MRSA by PCR: NEGATIVE

## 2019-07-27 LAB — PREPARE RBC (CROSSMATCH)

## 2019-07-27 MED ORDER — DEXTROSE 5 % IV SOLN
3.0000 g | INTRAVENOUS | Status: AC
  Administered 2019-07-28: 3 g via INTRAVENOUS
  Filled 2019-07-27: qty 3

## 2019-07-27 MED ORDER — FUROSEMIDE 40 MG PO TABS
40.0000 mg | ORAL_TABLET | Freq: Every day | ORAL | Status: DC
  Administered 2019-07-27 – 2019-07-31 (×4): 40 mg via ORAL
  Filled 2019-07-27 (×4): qty 1

## 2019-07-27 MED ORDER — DIPHENHYDRAMINE HCL 50 MG/ML IJ SOLN: 12.5000 mg | Freq: Four times a day (QID) | INTRAMUSCULAR | Status: DC | PRN

## 2019-07-27 MED ORDER — IRBESARTAN 75 MG PO TABS
37.5000 mg | ORAL_TABLET | Freq: Every day | ORAL | Status: DC
  Administered 2019-07-30 – 2019-07-31 (×2): 37.5 mg via ORAL
  Filled 2019-07-27: qty 0.5
  Filled 2019-07-27: qty 1
  Filled 2019-07-27 (×2): qty 0.5

## 2019-07-27 MED ORDER — ONDANSETRON HCL 4 MG/2ML IJ SOLN
4.0000 mg | INTRAMUSCULAR | Status: DC | PRN
  Administered 2019-07-28: 4 mg via INTRAVENOUS
  Filled 2019-07-27: qty 2

## 2019-07-27 MED ORDER — GABAPENTIN 300 MG PO CAPS: 300.0000 mg | ORAL_CAPSULE | Freq: Every day | ORAL | Status: DC | PRN

## 2019-07-27 MED ORDER — DIPHENHYDRAMINE HCL 12.5 MG/5ML PO ELIX: 12.5000 mg | ORAL_SOLUTION | Freq: Four times a day (QID) | ORAL | Status: DC | PRN

## 2019-07-27 MED ORDER — SODIUM CHLORIDE 0.9% FLUSH: 10.0000 mL | INTRAVENOUS | Status: DC | PRN

## 2019-07-27 MED ORDER — SODIUM CHLORIDE 0.9% IV SOLUTION: Freq: Once | INTRAVENOUS | Status: DC

## 2019-07-27 MED ORDER — METOPROLOL SUCCINATE ER 25 MG PO TB24
25.0000 mg | ORAL_TABLET | Freq: Every day | ORAL | Status: DC
  Administered 2019-07-28 – 2019-07-31 (×3): 25 mg via ORAL
  Filled 2019-07-27 (×4): qty 1

## 2019-07-27 MED ORDER — FUROSEMIDE 10 MG/ML IJ SOLN
20.0000 mg | Freq: Once | INTRAMUSCULAR | Status: AC
  Administered 2019-07-28: 20 mg via INTRAVENOUS
  Filled 2019-07-27: qty 2

## 2019-07-27 MED ORDER — ZOLPIDEM TARTRATE 5 MG PO TABS: 5.0000 mg | ORAL_TABLET | Freq: Every evening | ORAL | Status: DC | PRN

## 2019-07-27 MED ORDER — SIMVASTATIN 20 MG PO TABS
20.0000 mg | ORAL_TABLET | Freq: Every day | ORAL | Status: DC
  Administered 2019-07-27 – 2019-07-30 (×4): 20 mg via ORAL
  Filled 2019-07-27 (×2): qty 1
  Filled 2019-07-27: qty 2
  Filled 2019-07-27: qty 1

## 2019-07-27 MED ORDER — INSULIN ASPART 100 UNIT/ML ~~LOC~~ SOLN: 0.0000 [IU] | Freq: Three times a day (TID) | SUBCUTANEOUS | Status: DC

## 2019-07-27 NOTE — Progress Notes (Signed)
Patient is directed admission from home to [facility] at 1650. Alert and oriented x 4. Room is set up, call light is within patient's reach. Covid test done on 07/25/2019.

## 2019-07-27 NOTE — Progress Notes (Signed)
BP= 151/102. Patient has history of HTN. Directed admission to hospital for surgery tomorrow. Patient has home medication. Will recheck BP later

## 2019-07-27 NOTE — Progress Notes (Signed)
Paged on call urologist due to patient's concern about her insulin medication as she takes at home. Rn got verbalized order to place order for insulin sliding scale.

## 2019-07-27 NOTE — Progress Notes (Addendum)
Hypoglycemic Event  CBG: 50  Treatment: 4 oz juice/soda  Symptoms: Shaky  Follow-up CBG: Time:2201 CBG Result:71  Possible Reasons for Event: Inadequate meal intake Pt states she only ate once today.   Comments/MD notified: Pt CBG remains borderline low, will continue to monitor more closely. Will recheck at 0000. Pt has no c/o symptomatic hypoglycemia.    Kizzie Ide

## 2019-07-27 NOTE — Anesthesia Preprocedure Evaluation (Addendum)
Anesthesia Evaluation  Patient identified by MRN, date of birth, ID band Patient awake    Reviewed: Allergy & Precautions, NPO status , Patient's Chart, lab work & pertinent test results, reviewed documented beta blocker date and time   Airway Mallampati: III  TM Distance: >3 FB Neck ROM: Full    Dental  (+) Poor Dentition, Chipped, Missing,    Pulmonary asthma , PE   Pulmonary exam normal breath sounds clear to auscultation       Cardiovascular hypertension, Pt. on medications and Pt. on home beta blockers +CHF  Normal cardiovascular exam Rhythm:Regular Rate:Normal     Neuro/Psych negative neurological ROS  negative psych ROS   GI/Hepatic Neg liver ROS, GERD  Medicated and Controlled,  Endo/Other  diabetes, Poorly Controlled, Type 1, Insulin DependentMorbid obesityHyperlipidemia  Renal/GU Right renal mass  negative genitourinary   Musculoskeletal   Abdominal (+) + obese,   Peds  Hematology  (+) anemia ,   Anesthesia Other Findings   Reproductive/Obstetrics                            Anesthesia Physical Anesthesia Plan  ASA: III  Anesthesia Plan: General   Post-op Pain Management:    Induction: Intravenous  PONV Risk Score and Plan: 4 or greater and Scopolamine patch - Pre-op, Midazolam, Ondansetron, Dexamethasone and Treatment may vary due to age or medical condition  Airway Management Planned: Oral ETT  Additional Equipment:   Intra-op Plan:   Post-operative Plan: Extubation in OR  Informed Consent: I have reviewed the patients History and Physical, chart, labs and discussed the procedure including the risks, benefits and alternatives for the proposed anesthesia with the patient or authorized representative who has indicated his/her understanding and acceptance.     Dental advisory given  Plan Discussed with: CRNA and Surgeon  Anesthesia Plan Comments:         Anesthesia Quick Evaluation

## 2019-07-28 ENCOUNTER — Inpatient Hospital Stay (HOSPITAL_COMMUNITY): Payer: 59 | Admitting: Physician Assistant

## 2019-07-28 ENCOUNTER — Encounter (HOSPITAL_COMMUNITY)
Admission: RE | Disposition: A | Discharge status: Home / Self Care | Payer: Self-pay | Source: Home / Self Care | Attending: Urology

## 2019-07-28 ENCOUNTER — Inpatient Hospital Stay (HOSPITAL_COMMUNITY): Payer: 59 | Admitting: Certified Registered"

## 2019-07-28 ENCOUNTER — Encounter (HOSPITAL_COMMUNITY): Payer: Self-pay

## 2019-07-28 HISTORY — PX: ROBOTIC ASSITED PARTIAL NEPHRECTOMY: SHX6087

## 2019-07-28 LAB — HEMOGLOBIN AND HEMATOCRIT, BLOOD
HCT: 31.2 % — ABNORMAL LOW (ref 36.0–46.0)
HCT: 30 % — ABNORMAL LOW (ref 36.0–46.0)
Hemoglobin: 9.5 g/dL — ABNORMAL LOW (ref 12.0–15.0)
Hemoglobin: 9.1 g/dL — ABNORMAL LOW (ref 12.0–15.0)

## 2019-07-28 LAB — GLUCOSE, CAPILLARY
Glucose-Capillary: 153 mg/dL — ABNORMAL HIGH (ref 70–99)
Glucose-Capillary: 238 mg/dL — ABNORMAL HIGH (ref 70–99)
Glucose-Capillary: 257 mg/dL — ABNORMAL HIGH (ref 70–99)
Glucose-Capillary: 85 mg/dL (ref 70–99)
Glucose-Capillary: 99 mg/dL (ref 70–99)

## 2019-07-28 LAB — PREGNANCY, URINE: Preg Test, Ur: NEGATIVE

## 2019-07-28 SURGERY — NEPHRECTOMY, PARTIAL, ROBOT-ASSISTED
Anesthesia: General | Laterality: Right

## 2019-07-28 MED ORDER — STERILE WATER FOR IRRIGATION IR SOLN
Status: DC | PRN
  Administered 2019-07-28: 1000 mL

## 2019-07-28 MED ORDER — INSULIN ASPART 100 UNIT/ML ~~LOC~~ SOLN
0.0000 [IU] | Freq: Three times a day (TID) | SUBCUTANEOUS | Status: DC
  Administered 2019-07-28: 8 [IU] via SUBCUTANEOUS
  Administered 2019-07-29: 3 [IU] via SUBCUTANEOUS
  Administered 2019-07-29 – 2019-07-30 (×2): 2 [IU] via SUBCUTANEOUS
  Administered 2019-07-30 (×2): 3 [IU] via SUBCUTANEOUS
  Administered 2019-07-31 (×3): 5 [IU] via SUBCUTANEOUS

## 2019-07-28 MED ORDER — PHENYLEPHRINE 40 MCG/ML (10ML) SYRINGE FOR IV PUSH (FOR BLOOD PRESSURE SUPPORT)
PREFILLED_SYRINGE | INTRAVENOUS | Status: AC
  Filled 2019-07-28: qty 10

## 2019-07-28 MED ORDER — SODIUM CHLORIDE (PF) 0.9 % IJ SOLN
INTRAMUSCULAR | Status: DC | PRN
  Administered 2019-07-28: 20 mL

## 2019-07-28 MED ORDER — EPHEDRINE 5 MG/ML INJ
INTRAVENOUS | Status: AC
  Filled 2019-07-28: qty 10

## 2019-07-28 MED ORDER — MIDAZOLAM HCL 2 MG/2ML IJ SOLN
INTRAMUSCULAR | Status: AC
  Filled 2019-07-28: qty 2

## 2019-07-28 MED ORDER — ROCURONIUM BROMIDE 10 MG/ML (PF) SYRINGE
PREFILLED_SYRINGE | INTRAVENOUS | Status: DC | PRN
  Administered 2019-07-28 (×2): 20 mg via INTRAVENOUS
  Administered 2019-07-28: 50 mg via INTRAVENOUS
  Administered 2019-07-28 (×3): 10 mg via INTRAVENOUS

## 2019-07-28 MED ORDER — MAGNESIUM CITRATE PO SOLN: 1.0000 | Freq: Once | ORAL | Status: DC

## 2019-07-28 MED ORDER — PHENYLEPHRINE HCL-NACL 10-0.9 MG/250ML-% IV SOLN
INTRAVENOUS | Status: DC | PRN
  Administered 2019-07-28: 40 ug/min via INTRAVENOUS

## 2019-07-28 MED ORDER — SUCCINYLCHOLINE CHLORIDE 200 MG/10ML IV SOSY
PREFILLED_SYRINGE | INTRAVENOUS | Status: DC | PRN
  Administered 2019-07-28: 120 mg via INTRAVENOUS

## 2019-07-28 MED ORDER — HYDROMORPHONE HCL 1 MG/ML IJ SOLN
INTRAMUSCULAR | Status: AC
  Administered 2019-07-29: 1 mg via INTRAVENOUS
  Filled 2019-07-28: qty 2

## 2019-07-28 MED ORDER — EPHEDRINE SULFATE-NACL 50-0.9 MG/10ML-% IV SOSY
PREFILLED_SYRINGE | INTRAVENOUS | Status: DC | PRN
  Administered 2019-07-28: 5 mg via INTRAVENOUS
  Administered 2019-07-28: 10 mg via INTRAVENOUS

## 2019-07-28 MED ORDER — MIDAZOLAM HCL 2 MG/2ML IJ SOLN
INTRAMUSCULAR | Status: DC | PRN
  Administered 2019-07-28: 2 mg via INTRAVENOUS

## 2019-07-28 MED ORDER — ROCURONIUM BROMIDE 10 MG/ML (PF) SYRINGE
PREFILLED_SYRINGE | INTRAVENOUS | Status: AC
  Filled 2019-07-28: qty 10

## 2019-07-28 MED ORDER — SODIUM CHLORIDE (PF) 0.9 % IJ SOLN
INTRAMUSCULAR | Status: AC
  Filled 2019-07-28: qty 20

## 2019-07-28 MED ORDER — FENTANYL CITRATE (PF) 100 MCG/2ML IJ SOLN
INTRAMUSCULAR | Status: DC | PRN
  Administered 2019-07-28 (×2): 50 ug via INTRAVENOUS
  Administered 2019-07-28: 100 ug via INTRAVENOUS

## 2019-07-28 MED ORDER — SUGAMMADEX SODIUM 500 MG/5ML IV SOLN
INTRAVENOUS | Status: DC | PRN
  Administered 2019-07-28: 400 mg via INTRAVENOUS

## 2019-07-28 MED ORDER — HYDROCODONE-ACETAMINOPHEN 5-325 MG PO TABS: 1.0000 | ORAL_TABLET | Freq: Four times a day (QID) | ORAL | 0 refills | Status: DC | PRN

## 2019-07-28 MED ORDER — PROPOFOL 10 MG/ML IV BOLUS
INTRAVENOUS | Status: AC
  Filled 2019-07-28: qty 20

## 2019-07-28 MED ORDER — LACTATED RINGERS IV SOLN
INTRAVENOUS | Status: DC
  Administered 2019-07-28 (×2): via INTRAVENOUS

## 2019-07-28 MED ORDER — LACTATED RINGERS IR SOLN
Status: DC | PRN
  Administered 2019-07-28: 1000 mL

## 2019-07-28 MED ORDER — PROPOFOL 10 MG/ML IV BOLUS
INTRAVENOUS | Status: DC | PRN
  Administered 2019-07-28: 200 mg via INTRAVENOUS
  Administered 2019-07-28: 20 mg via INTRAVENOUS

## 2019-07-28 MED ORDER — HYDROMORPHONE HCL 1 MG/ML IJ SOLN
0.5000 mg | INTRAMUSCULAR | Status: DC | PRN
  Administered 2019-07-28 – 2019-07-29 (×3): 1 mg via INTRAVENOUS
  Filled 2019-07-28 (×3): qty 1

## 2019-07-28 MED ORDER — BELLADONNA ALKALOIDS-OPIUM 16.2-60 MG RE SUPP: 1.0000 | Freq: Four times a day (QID) | RECTAL | Status: DC | PRN

## 2019-07-28 MED ORDER — LIDOCAINE 2% (20 MG/ML) 5 ML SYRINGE
INTRAMUSCULAR | Status: DC | PRN
  Administered 2019-07-28: 80 mg via INTRAVENOUS

## 2019-07-28 MED ORDER — DEXAMETHASONE SODIUM PHOSPHATE 10 MG/ML IJ SOLN
INTRAMUSCULAR | Status: AC
  Filled 2019-07-28: qty 1

## 2019-07-28 MED ORDER — MEPERIDINE HCL 50 MG/ML IJ SOLN
6.2500 mg | INTRAMUSCULAR | Status: DC | PRN
  Administered 2019-07-28 (×2): 6.25 mg via INTRAVENOUS

## 2019-07-28 MED ORDER — OXYCODONE HCL 5 MG PO TABS
5.0000 mg | ORAL_TABLET | ORAL | Status: DC | PRN
  Administered 2019-07-28 – 2019-07-29 (×3): 5 mg via ORAL
  Filled 2019-07-28 (×3): qty 1

## 2019-07-28 MED ORDER — ONDANSETRON HCL 4 MG/2ML IJ SOLN
INTRAMUSCULAR | Status: AC
  Filled 2019-07-28: qty 2

## 2019-07-28 MED ORDER — SODIUM CHLORIDE 0.45 % IV SOLN
INTRAVENOUS | Status: DC
  Administered 2019-07-28 – 2019-07-30 (×5): via INTRAVENOUS

## 2019-07-28 MED ORDER — SUGAMMADEX SODIUM 500 MG/5ML IV SOLN
INTRAVENOUS | Status: AC
  Filled 2019-07-28: qty 5

## 2019-07-28 MED ORDER — CHLORHEXIDINE GLUCONATE CLOTH 2 % EX PADS
6.0000 | MEDICATED_PAD | Freq: Every day | CUTANEOUS | Status: DC
  Administered 2019-07-29 – 2019-07-31 (×3): 6 via TOPICAL

## 2019-07-28 MED ORDER — SCOPOLAMINE 1 MG/3DAYS TD PT72
MEDICATED_PATCH | TRANSDERMAL | Status: AC
  Filled 2019-07-28: qty 1

## 2019-07-28 MED ORDER — DEXAMETHASONE SODIUM PHOSPHATE 10 MG/ML IJ SOLN
INTRAMUSCULAR | Status: DC | PRN
  Administered 2019-07-28: 4 mg via INTRAVENOUS

## 2019-07-28 MED ORDER — PHENYLEPHRINE 40 MCG/ML (10ML) SYRINGE FOR IV PUSH (FOR BLOOD PRESSURE SUPPORT)
PREFILLED_SYRINGE | INTRAVENOUS | Status: DC | PRN
  Administered 2019-07-28: 120 ug via INTRAVENOUS
  Administered 2019-07-28: 80 ug via INTRAVENOUS
  Administered 2019-07-28: 120 ug via INTRAVENOUS
  Administered 2019-07-28: 200 ug via INTRAVENOUS
  Administered 2019-07-28: 120 ug via INTRAVENOUS

## 2019-07-28 MED ORDER — HYDROMORPHONE HCL 1 MG/ML IJ SOLN
0.2500 mg | INTRAMUSCULAR | Status: DC | PRN
  Administered 2019-07-28 (×4): 0.5 mg via INTRAVENOUS

## 2019-07-28 MED ORDER — ACETAMINOPHEN 500 MG PO TABS
1000.0000 mg | ORAL_TABLET | Freq: Four times a day (QID) | ORAL | Status: AC
  Administered 2019-07-28 – 2019-07-29 (×3): 1000 mg via ORAL
  Filled 2019-07-28 (×3): qty 2

## 2019-07-28 MED ORDER — BUPIVACAINE LIPOSOME 1.3 % IJ SUSP
20.0000 mL | Freq: Once | INTRAMUSCULAR | Status: AC
  Administered 2019-07-28: 20 mL
  Filled 2019-07-28: qty 20

## 2019-07-28 MED ORDER — ONDANSETRON HCL 4 MG/2ML IJ SOLN
INTRAMUSCULAR | Status: DC | PRN
  Administered 2019-07-28: 4 mg via INTRAVENOUS

## 2019-07-28 MED ORDER — METOCLOPRAMIDE HCL 5 MG/ML IJ SOLN: 10.0000 mg | Freq: Once | INTRAMUSCULAR | Status: DC | PRN

## 2019-07-28 MED ORDER — SCOPOLAMINE 1 MG/3DAYS TD PT72
MEDICATED_PATCH | TRANSDERMAL | Status: DC | PRN
  Administered 2019-07-28: 1 via TRANSDERMAL

## 2019-07-28 MED ORDER — MEPERIDINE HCL 50 MG/ML IJ SOLN
INTRAMUSCULAR | Status: AC
  Filled 2019-07-28: qty 1

## 2019-07-28 MED ORDER — DOCUSATE SODIUM 100 MG PO CAPS
100.0000 mg | ORAL_CAPSULE | Freq: Two times a day (BID) | ORAL | Status: DC
  Administered 2019-07-28 – 2019-07-31 (×6): 100 mg via ORAL
  Filled 2019-07-28 (×6): qty 1

## 2019-07-28 MED ORDER — FENTANYL CITRATE (PF) 250 MCG/5ML IJ SOLN
INTRAMUSCULAR | Status: AC
  Filled 2019-07-28: qty 5

## 2019-07-28 MED ORDER — PHENYLEPHRINE HCL (PRESSORS) 10 MG/ML IV SOLN
INTRAVENOUS | Status: AC
  Filled 2019-07-28: qty 1

## 2019-07-28 MED ORDER — LIDOCAINE 2% (20 MG/ML) 5 ML SYRINGE
INTRAMUSCULAR | Status: AC
  Filled 2019-07-28: qty 5

## 2019-07-28 SURGICAL SUPPLY — 69 items
APPLICATOR SURGIFLO ENDO (HEMOSTASIS) ×2 IMPLANT
CHLORAPREP W/TINT 26 (MISCELLANEOUS) ×2 IMPLANT
CLIP SUT LAPRA TY ABSORB (SUTURE) ×4 IMPLANT
CLIP VESOLOCK LG 6/CT PURPLE (CLIP) ×2 IMPLANT
CLIP VESOLOCK MED LG 6/CT (CLIP) ×5 IMPLANT
COVER SURGICAL LIGHT HANDLE (MISCELLANEOUS) ×2 IMPLANT
COVER TIP SHEARS 8 DVNC (MISCELLANEOUS) ×1 IMPLANT
COVER TIP SHEARS 8MM DA VINCI (MISCELLANEOUS) ×1
COVER WAND RF STERILE (DRAPES) IMPLANT
DECANTER SPIKE VIAL GLASS SM (MISCELLANEOUS) ×2 IMPLANT
DERMABOND ADVANCED (GAUZE/BANDAGES/DRESSINGS) ×1
DERMABOND ADVANCED .7 DNX12 (GAUZE/BANDAGES/DRESSINGS) ×1 IMPLANT
DRAIN CHANNEL 15F RND FF 3/16 (WOUND CARE) ×2 IMPLANT
DRAPE ARM DVNC X/XI (DISPOSABLE) ×4 IMPLANT
DRAPE COLUMN DVNC XI (DISPOSABLE) ×1 IMPLANT
DRAPE DA VINCI XI ARM (DISPOSABLE) ×4
DRAPE DA VINCI XI COLUMN (DISPOSABLE) ×1
DRAPE INCISE IOBAN 66X45 STRL (DRAPES) ×2 IMPLANT
DRAPE SHEET LG 3/4 BI-LAMINATE (DRAPES) ×2 IMPLANT
DRSG TEGADERM 4X4.75 (GAUZE/BANDAGES/DRESSINGS) ×2 IMPLANT
ELECT PENCIL ROCKER SW 15FT (MISCELLANEOUS) ×2 IMPLANT
ELECT REM PT RETURN 15FT ADLT (MISCELLANEOUS) ×2 IMPLANT
EVACUATOR SILICONE 100CC (DRAIN) ×2 IMPLANT
GLOVE BIO SURGEON STRL SZ 6.5 (GLOVE) ×2 IMPLANT
GLOVE BIOGEL M 8.0 STRL (GLOVE) ×4 IMPLANT
GOWN STRL REUS W/TWL LRG LVL3 (GOWN DISPOSABLE) ×4 IMPLANT
HEMOSTAT SURGICEL 4X8 (HEMOSTASIS) ×3 IMPLANT
IRRIG SUCT STRYKERFLOW 2 WTIP (MISCELLANEOUS) ×2
IRRIGATION SUCT STRKRFLW 2 WTP (MISCELLANEOUS) IMPLANT
KIT BASIN OR (CUSTOM PROCEDURE TRAY) ×2 IMPLANT
KIT TURNOVER KIT A (KITS) IMPLANT
LOOP VESSEL MAXI BLUE (MISCELLANEOUS) ×1 IMPLANT
MARKER SKIN DUAL TIP RULER LAB (MISCELLANEOUS) ×2 IMPLANT
NDL INSUFFLATION 14GA 120MM (NEEDLE) ×1 IMPLANT
NEEDLE INSUFFLATION 14GA 120MM (NEEDLE) ×2 IMPLANT
NS IRRIG 1000ML POUR BTL (IV SOLUTION) ×2 IMPLANT
POUCH SPECIMEN RETRIEVAL 10MM (ENDOMECHANICALS) ×2 IMPLANT
PROTECTOR NERVE ULNAR (MISCELLANEOUS) ×4 IMPLANT
RELOAD STAPLE 60 2.6 WHT THN (STAPLE) IMPLANT
RELOAD STAPLER WHITE 60MM (STAPLE) IMPLANT
SEAL CANN UNIV 5-8 DVNC XI (MISCELLANEOUS) ×4 IMPLANT
SEAL XI 5MM-8MM UNIVERSAL (MISCELLANEOUS) ×4
SET TUBE SMOKE EVAC HIGH FLOW (TUBING) ×2 IMPLANT
SOLUTION ELECTROLUBE (MISCELLANEOUS) ×2 IMPLANT
SPONGE LAP 18X18 RF (DISPOSABLE) ×1 IMPLANT
SPONGE LAP 4X18 RFD (DISPOSABLE) ×2 IMPLANT
STAPLE ECHEON FLEX 60 POW ENDO (STAPLE) IMPLANT
STAPLER RELOAD WHITE 60MM (STAPLE)
SURGIFLO W/THROMBIN 8M KIT (HEMOSTASIS) ×2 IMPLANT
SUT ETHILON 3 0 PS 1 (SUTURE) ×2 IMPLANT
SUT MNCRL AB 4-0 PS2 18 (SUTURE) ×4 IMPLANT
SUT MON AB 4-0 SH 27 (SUTURE) ×2 IMPLANT
SUT PDS AB 0 CT 36 (SUTURE) ×2 IMPLANT
SUT VIC AB 0 CT1 27 (SUTURE) ×5
SUT VIC AB 0 CT1 27XBRD ANTBC (SUTURE) ×4 IMPLANT
SUT VIC AB 2-0 SH 27 (SUTURE) ×5
SUT VIC AB 2-0 SH 27X BRD (SUTURE) ×5 IMPLANT
SUT VICRYL 0 UR6 27IN ABS (SUTURE) ×1 IMPLANT
SUT VLOC BARB 180 ABS3/0GR12 (SUTURE) ×2
SUTURE VLOC BRB 180 ABS3/0GR12 (SUTURE) ×1 IMPLANT
TOWEL OR 17X26 10 PK STRL BLUE (TOWEL DISPOSABLE) IMPLANT
TOWEL OR NON WOVEN STRL DISP B (DISPOSABLE) ×2 IMPLANT
TRAY FOLEY MTR SLVR 16FR STAT (SET/KITS/TRAYS/PACK) ×2 IMPLANT
TRAY LAPAROSCOPIC (CUSTOM PROCEDURE TRAY) ×2 IMPLANT
TROCAR BLADELESS OPT 5 100 (ENDOMECHANICALS) IMPLANT
TROCAR UNIVERSAL OPT 12M 100M (ENDOMECHANICALS) IMPLANT
TROCAR XCEL 12X100 BLDLESS (ENDOMECHANICALS) ×2 IMPLANT
WATER STERILE IRR 1000ML POUR (IV SOLUTION) ×4 IMPLANT
YANKAUER SUCT BULB TIP 10FT TU (MISCELLANEOUS) ×1 IMPLANT

## 2019-07-28 NOTE — Discharge Instructions (Signed)
1.  Activity:  You are encouraged to ambulate frequently (about every hour during waking hours) to help prevent blood clots from forming in your legs or lungs.  However, you should not engage in any heavy lifting (> 10-15 lbs), strenuous activity, or straining. 2. Diet: You should advance your diet as instructed by your physician.  It will be normal to have some bloating, nausea, and abdominal discomfort intermittently. 3. Prescriptions:  You will be provided a prescription for pain medication to take as needed.  If your pain is not severe enough to require the prescription pain medication, you may take extra strength Tylenol instead which will have less side effects.  You should also take a prescribed stool softener to avoid straining with bowel movements as the prescription pain medication may constipate you. 4. Incisions: You may remove your dressing bandages 48 hours after surgery if not removed in the hospital.  You will either have some small staples or special tissue glue at each of the incision sites. Once the bandages are removed (if present), the incisions may stay open to air.  You may start showering (but not soaking or bathing in water) the 2nd day after surgery and the incisions simply need to be patted dry after the shower.  No additional care is needed. 5. What to call us about: You should call the office [phone]) if you develop fever > 101 or develop persistent vomiting.   You may resume aspirin, advil, aleve, vitamins, and supplements 7 days after surgery.  You may resume Xarelto 5 days after surgery.

## 2019-07-28 NOTE — Transfer of Care (Signed)
Immediate Anesthesia Transfer of Care Note  Patient: Maire Rzepecki  Procedure(s) Performed: XI ROBOTIC ASSITED PARTIAL NEPHRECTOMY (Right )  Patient Location: PACU  Anesthesia Type:General  Level of Consciousness: awake and patient cooperative  Airway & Oxygen Therapy: Patient Spontanous Breathing and Patient connected to face mask oxygen  Post-op Assessment: Report given to RN and Post -op Vital signs reviewed and stable  Post vital signs: Reviewed and stable  Last Vitals:  Vitals Value Taken Time  BP 153/107 07/28/19 1100  Temp    Pulse 82 07/28/19 1103  Resp 17 07/28/19 1103  SpO2 100 % 07/28/19 1103  Vitals shown include unvalidated device data.  Last Pain:  Vitals:   07/28/19 0713  TempSrc:   PainSc: 0-No pain         Complications: No apparent anesthesia complications

## 2019-07-28 NOTE — Anesthesia Procedure Notes (Signed)
Procedure Name: Intubation Date/Time: 07/28/2019 7:50 AM Performed by: Niel Hummer, CRNA Pre-anesthesia Checklist: Patient identified, Emergency Drugs available, Suction available and Patient being monitored Patient Re-evaluated:Patient Re-evaluated prior to induction Oxygen Delivery Method: Circle system utilized Preoxygenation: Pre-oxygenation with 100% oxygen Induction Type: IV induction Ventilation: Mask ventilation without difficulty Laryngoscope Size: Mac and 4 Grade View: Grade I Tube type: Oral Tube size: 7.0 mm Number of attempts: 1 Airway Equipment and Method: Stylet Placement Confirmation: ETT inserted through vocal cords under direct vision,  positive ETCO2 and breath sounds checked- equal and bilateral Secured at: 22 cm Tube secured with: Tape Dental Injury: Teeth and Oropharynx as per pre-operative assessment

## 2019-07-28 NOTE — Anesthesia Postprocedure Evaluation (Signed)
Anesthesia Post Note  Patient: Sherri Dixon  Procedure(s) Performed: XI ROBOTIC ASSITED PARTIAL NEPHRECTOMY (Right )     Patient location during evaluation: PACU Anesthesia Type: General Level of consciousness: awake and alert and oriented Pain management: pain level controlled Vital Signs Assessment: post-procedure vital signs reviewed and stable Respiratory status: spontaneous breathing, nonlabored ventilation and respiratory function stable Cardiovascular status: blood pressure returned to baseline and stable Postop Assessment: no apparent nausea or vomiting Anesthetic complications: no    Last Vitals:  Vitals:   07/28/19 1145 07/28/19 1216  BP: (!) 146/90 (!) 157/101  Pulse: 80 84  Resp: 18 20  Temp: (!) 36.4 C 36.5 C  SpO2: 99% 98%    Last Pain:  Vitals:   07/28/19 1216  TempSrc: Oral  PainSc:                  Shanessa Hodak A.

## 2019-07-28 NOTE — H&P (Signed)
Urology Admission H&P  Chief Complaint: right renal mass  History of Present Illness: Ms Sherri Dixon is a 38yo with a hx of PE, CHF, chronic anemia, DMII who has a 3cm right renal mass.  She denies any flank pain. She was admitted with a pre-op hemoglobin of 7.5 and was tachycardic. She denies any gross hematuria. No chest pain. Mass is amenable to partial nephrectomy  Past Medical History:  Diagnosis Date  . Anemia   . Asthma   . CHF (congestive heart failure) (HCC)    Pt reports that father has CHF, not her  . Diabetes mellitus without complication ([place])   . Dyslipidemia   . GERD (gastroesophageal reflux disease)   . Pulmonary embolism [facility])    Past Surgical History:  Procedure Laterality Date  . MOUTH SURGERY      Home Medications:  Current Facility-Administered Medications  Medication Dose Route Frequency Provider Last Rate Last Dose  . [MAR Hold] 0.9 %  sodium chloride infusion (Manually program via Guardrails IV Fluids)   Intravenous Once Lene Mckay, Candee Furbish, MD      . bupivacaine liposome (EXPAREL) 1.3 % injection 266 mg  20 mL Infiltration Once Niraj Kudrna, Candee Furbish, MD      . ceFAZolin (ANCEF) 3 g in dextrose 5 % 50 mL IVPB  3 g Intravenous 30 min Pre-Op Teckla Christiansen, Candee Furbish, MD      . Doug Sou Hold] diphenhydrAMINE (BENADRYL) injection 12.5-25 mg  12.5-25 mg Intravenous Q6H PRN Cleon Gustin, MD       Or  . Doug Sou Hold] diphenhydrAMINE (BENADRYL) 12.5 MG/5ML elixir 12.5-25 mg  12.5-25 mg Oral Q6H PRN Cleon Gustin, MD      . Doug Sou Hold] furosemide (LASIX) tablet 40 mg  40 mg Oral Daily Cleon Gustin, MD   40 mg at 07/27/19 2047  . [MAR Hold] gabapentin (NEURONTIN) capsule 300 mg  300 mg Oral Daily PRN Cleon Gustin, MD      . Doug Sou Hold] insulin aspart (novoLOG) injection 0-15 Units  0-15 Units Subcutaneous TID WC Yazaira Speas, Candee Furbish, MD      . Doug Sou Hold] irbesartan (AVAPRO) tablet 37.5 mg  37.5 mg Oral Daily Keita [place], Candee Furbish, MD      . lactated ringers infusion    Intravenous Continuous Josephine Igo, MD 10 mL/hr at 07/28/19 0710    . magnesium citrate solution 1 Bottle  1 Bottle Oral Once Cleon Gustin, MD   Stopped at 07/28/19 [phone]  . [MAR Hold] metoprolol succinate (TOPROL-XL) 24 hr tablet 25 mg  25 mg Oral Daily Cleon Gustin, MD   25 mg at 07/28/19 0544  . [MAR Hold] ondansetron (ZOFRAN) injection 4 mg  4 mg Intravenous Q4H PRN Alveta Quintela, Candee Furbish, MD      . scopolamine (TRANSDERM-SCOP) 1 MG/3DAYS           . [MAR Hold] simvastatin (ZOCOR) tablet 20 mg  20 mg Oral QHS Cleon Gustin, MD   20 mg at 07/27/19 2047  . [MAR Hold] sodium chloride flush (NS) 0.9 % injection 10-40 mL  10-40 mL Intracatheter PRN Ashleynicole Mcclees, Candee Furbish, MD      . Doug Sou Hold] zolpidem (AMBIEN) tablet 5 mg  5 mg Oral QHS PRN Alyson Ingles Candee Furbish, MD       Allergies:  Allergies  Allergen Reactions  . Humalog Kwikpen [Insulin Lispro] Other (See Comments)    75/50---swelling and ulcers in mouth     Family History  Problem Relation Age  of Onset  . High blood pressure Mother   . Diabetes Mother   . Arthritis Mother   . Hyperlipidemia Mother   . High blood pressure Father   . Hyperlipidemia Father   . High blood pressure Sister   . Diabetes Brother   . Hyperlipidemia Brother   . High blood pressure Brother    Social History:  reports that she has never smoked. She has never used smokeless tobacco. She reports current alcohol use of about 1.0 standard drinks of alcohol per week. She reports that she does not use drugs.  Review of Systems  All other systems reviewed and are negative.   Physical Exam:  Vital signs in last 24 hours: Temp:  [98.1 F (36.7 C)-99 F (37.2 C)] 99 F (37.2 C) (10/29 0656) Pulse Rate:  [67-93] 90 (10/29 0656) Resp:  [15-20] 18 (10/29 0656) BP: (150-176)/(84-109) 176/109 (10/29 0656) SpO2:  [100 %] 100 % (10/29 0656) Weight:  [122.4 kg] 122.4 kg (10/29 [id number]) Physical Exam  Constitutional: She is oriented to person,  place, and time. She appears well-developed and well-nourished.  HENT:  Head: Normocephalic and atraumatic.  Eyes: Pupils are equal, round, and reactive to light. EOM are normal.  Neck: Normal range of motion. No thyromegaly present.  Cardiovascular: Normal rate and regular rhythm.  Respiratory: Effort normal. No respiratory distress.  GI: Soft. She exhibits no distension.  Musculoskeletal: Normal range of motion.        General: No edema.  Neurological: She is alert and oriented to person, place, and time.  Skin: Skin is warm and dry.  Psychiatric: She has a normal mood and affect. Her behavior is normal. Judgment and thought content normal.    Laboratory Data:  Results for orders placed or performed during the hospital encounter of 07/27/19 (from the past 24 hour(s))  Type and screen Vantage     Status: None (Preliminary result)   Collection Time: 07/27/19  5:20 PM  Result Value Ref Range   ABO/RH(D) A POS    Antibody Screen NEG    Sample Expiration 07/30/2019,2359    Unit Number [id number]    Blood Component Type RED CELLS,LR    Unit division 00    Status of Unit ISSUED,FINAL    Transfusion Status OK TO TRANSFUSE    Crossmatch Result      Compatible Performed at [facility], [address] [place], [place] [address]    Unit Number [id number]    Blood Component Type RED CELLS,LR    Unit division 00    Status of Unit ISSUED    Transfusion Status OK TO TRANSFUSE    Crossmatch Result Compatible    Unit Number [id number]    Blood Component Type RED CELLS,LR    Unit division 00    Status of Unit ALLOCATED    Transfusion Status OK TO TRANSFUSE    Crossmatch Result Compatible   Prepare RBC     Status: None   Collection Time: 07/27/19  5:20 PM  Result Value Ref Range   Order Confirmation      ORDER PROCESSED BY BLOOD BANK Performed at [facility], [place] [address] [place],  [address]   HIV  Antibody (routine testing w rflx)     Status: None   Collection Time: 07/27/19  5:20 PM  Result Value Ref Range   HIV Screen 4th Generation wRfx NON REACTIVE NON REACTIVE  MRSA PCR Screening     Status:  None   Collection Time: 07/27/19  6:12 PM   Specimen: Nasopharyngeal  Result Value Ref Range   MRSA by PCR NEGATIVE NEGATIVE  Glucose, capillary     Status: Abnormal   Collection Time: 07/27/19  8:40 PM  Result Value Ref Range   Glucose-Capillary 50 (L) 70 - 99 mg/dL  Glucose, capillary     Status: Abnormal   Collection Time: 07/27/19  9:35 PM  Result Value Ref Range   Glucose-Capillary 67 (L) 70 - 99 mg/dL  Glucose, capillary     Status: None   Collection Time: 07/27/19 10:01 PM  Result Value Ref Range   Glucose-Capillary 71 70 - 99 mg/dL  Glucose, capillary     Status: None   Collection Time: 07/28/19 12:06 AM  Result Value Ref Range   Glucose-Capillary 85 70 - 99 mg/dL  Hemoglobin and hematocrit, blood     Status: Abnormal   Collection Time: 07/28/19  5:52 AM  Result Value Ref Range   Hemoglobin 9.5 (L) 12.0 - 15.0 g/dL   HCT 31.2 (L) 36.0 - 46.0 %  Glucose, capillary     Status: None   Collection Time: 07/28/19  6:55 AM  Result Value Ref Range   Glucose-Capillary 99 70 - 99 mg/dL   Comment 1 Notify RN    Comment 2 Document in Chart   Pregnancy, urine     Status: None   Collection Time: 07/28/19  7:00 AM  Result Value Ref Range   Preg Test, Ur NEGATIVE NEGATIVE   Recent Results (from the past 240 hour(s))  Novel Coronavirus, NAA (Hosp order, Send-out to Ref Lab; TAT 18-24 hrs     Status: None   Collection Time: 07/25/19  2:37 PM   Specimen: Nasopharyngeal Swab; Respiratory  Result Value Ref Range Status   SARS-CoV-2, NAA NOT DETECTED NOT DETECTED Final    Comment: (NOTE) This nucleic acid amplification test was developed and its performance characteristics determined by [employer]. Nucleic acid amplification tests include PCR and TMA. This test has not  been FDA cleared or approved. This test has been authorized by FDA under an Emergency Use Authorization (EUA). This test is only authorized for the duration of time the declaration that circumstances exist justifying the authorization of the emergency use of in vitro diagnostic tests for detection of SARS-CoV-2 virus and/or diagnosis of COVID-19 infection under section 564(b)(1) of the Act, 21 U.S.C. [id number]) (1), unless the authorization is terminated or revoked sooner. When diagnostic testing is negative, the possibility of a false negative result should be considered in the context of a patient's recent exposures and the presence of clinical signs and symptoms consistent with COVID-19. An individual without symptoms of COVID- 19 and who is not shedding SARS-CoV-2 vi rus would expect to have a negative (not detected) result in this assay. Performed At: [facility] [address] [place], [place] [id number] Rush Farmer MD [id number]    Virgie  Final    Comment: Performed at [facility], [place] [address], [place], [place] [address]  MRSA PCR Screening     Status: None   Collection Time: 07/27/19  6:12 PM   Specimen: Nasopharyngeal  Result Value Ref Range Status   MRSA by PCR NEGATIVE NEGATIVE Final    Comment:        The GeneXpert MRSA Assay (FDA approved for NASAL specimens only), is one component of a comprehensive MRSA colonization surveillance program. It is not intended to diagnose MRSA infection  nor to guide or monitor treatment for MRSA infections. Performed at [facility], [place] [address], [place], [place] [address]    Creatinine: No results for input(s): CREATININE in the last 168 hours. Baseline Creatinine: 0.9  Impression/Assessment:  38yo with right renal mass  Plan:  The risks/benefits/alternatives to right robotic partial nephrectomy was explained to the patient and she understands and  wishes to proceed with surgery  Nicolette Bang 07/28/2019, 7:33 AM

## 2019-07-28 NOTE — Op Note (Signed)
Preoperative diagnosis: Right renal mass  Postop diagnosis: Same  Procedure: 1.  Right robot assisted laparoscopic radical partial nephrectomy  Attending: Nicolette Bang, MD  Assistant: Clemetine Marker, PA  Resident: Sharlot Gowda, MD  Anesthesia: General  Estimated blood loss: 400 cc  Drains: 16 French Foley catheter, JP drain  Specimens: Right renal mass with overlying renal fat  Antibiotics: ancef  Findings: 1 artery and 2 veins. 3cm right anterior upper pole renal mass Warm Ischemia time: 21 minutes The assistant was utilized for retraction, suction, deploying bulldog clamp, and passing sutures.  Indications: Patient is a 38 year old with a history of 3 cm right renal mass.  The mass was amenable to partial nephrectomy.  After discussing treatment options patient decided to proceed with right robot assisted laparoscopic partial nephrectomy.  Procedure in detail: Prior to procedure consent was obtained. Patient was brought to the operating room and briefing was done sure correct patient, correct procedure, correct site.  General anesthesia was in administered patient was placed in the left lateral decubitus position.  a 55 French catheter was placed. their abdomen and flank was then prepped and draped usual sterile fashion.  A Veress needle was used to obtain pneumoperitoneum.  Once pneumoperitoneum was reestablished to 15 mmHg we then placed a 8 mm camera port lateral to the umbilicus at the latera; edge of rectus.  We then proceeded to place 3 more robotic ports. We then placed an assistant port. We then docked the robot.  We then started this dissection along the white line of [place]t.  We then reflected the colon medially.  We then kocherized the duodenum. We then identified the psoas muscle.  Once this was done we traced it down to the iliac vessels and identified the ureter.  Once we identified the gonadal vein and ureter were then traced this to the renal hilum.  The renal vein and  renal artery were skeletonized.  We did we identified one renal vein one renal artery. We placed a vessel loop around the renal artery.  We then turned our attention to locating the renal mass. We proceeded to remove the overlying renal fat until we identified the renal mass. Once this was done with the circumscribed the mass with electrocautery. We then placed a bulldog clamp on the renal artery and this began our warm ischemia time. Using sharp dissection the mass was removed. Using a 2-0 V lock suture we oversewed the tumor bed. We then used 0 Vicryl with hem-o-locks in an interrupted fashion to reapproximate the defect int he kidney. Once this was done we removed the bulldog clamp and noted no residual bleeding.  We then placed the specimen in an Endo Catch bag.  Once the specimen was in the Endo Catch bag we then inspected the left kidney and noted no residual bleeding. We then placed a JP drain in the lower quadrant robot port. THis was secured with a 0 nylon.  We then removed our instruments, undocked the robot, and released the pneumoperitoneum. Once the specimen was removed we then closed the camera and assistant ports with 0 Vicryl in interrupted fashion.  The skin was then subcuticularly closed with 4-0 Monocryl.  We then placed Dermabond over all the incisions.  This included the procedure which resulted by the patient.  Complications: None  Condition: Stable, x-rayed, transferred to PACU.  Plan: Patient is to be admitted for inpatient stay. The foley catheter will be removed in the morning. They will be started on a clear liquid  diet POD#1

## 2019-07-29 ENCOUNTER — Encounter (HOSPITAL_COMMUNITY): Payer: Self-pay | Admitting: Urology

## 2019-07-29 LAB — GLUCOSE, CAPILLARY
Glucose-Capillary: 126 mg/dL — ABNORMAL HIGH (ref 70–99)
Glucose-Capillary: 108 mg/dL — ABNORMAL HIGH (ref 70–99)
Glucose-Capillary: 155 mg/dL — ABNORMAL HIGH (ref 70–99)
Glucose-Capillary: 169 mg/dL — ABNORMAL HIGH (ref 70–99)

## 2019-07-29 LAB — BASIC METABOLIC PANEL
Anion gap: 9 (ref 5–15)
BUN: 16 mg/dL (ref 6–20)
CO2: 26 mmol/L (ref 22–32)
Calcium: 8.7 mg/dL — ABNORMAL LOW (ref 8.9–10.3)
Chloride: 103 mmol/L (ref 98–111)
Creatinine, Ser: 1.26 mg/dL — ABNORMAL HIGH (ref 0.44–1.00)
GFR calc Af Amer: >60 mL/min (ref >60)
GFR calc non Af Amer: 53 mL/min — ABNORMAL LOW (ref >60)
Glucose, Bld: 150 mg/dL — ABNORMAL HIGH (ref 70–99)
Potassium: 4.2 mmol/L (ref 3.5–5.1)
Sodium: 138 mmol/L (ref 135–145)

## 2019-07-29 LAB — HEMOGLOBIN AND HEMATOCRIT, BLOOD
HCT: 25.5 % — ABNORMAL LOW (ref 36.0–46.0)
Hemoglobin: 7.7 g/dL — ABNORMAL LOW (ref 12.0–15.0)

## 2019-07-29 LAB — PREPARE RBC (CROSSMATCH)

## 2019-07-29 MED ORDER — MENTHOL 3 MG MT LOZG
1.0000 | LOZENGE | OROMUCOSAL | Status: DC | PRN
  Administered 2019-07-29: 3 mg via ORAL
  Filled 2019-07-29: qty 9

## 2019-07-29 MED ORDER — SODIUM CHLORIDE 0.9% IV SOLUTION
Freq: Once | INTRAVENOUS | Status: AC
  Administered 2019-07-29: 12:00:00 via INTRAVENOUS

## 2019-07-29 MED ORDER — ACETAMINOPHEN 500 MG PO TABS
1000.0000 mg | ORAL_TABLET | Freq: Four times a day (QID) | ORAL | Status: AC
  Administered 2019-07-29 – 2019-07-30 (×4): 1000 mg via ORAL
  Filled 2019-07-29 (×4): qty 2

## 2019-07-29 NOTE — Progress Notes (Signed)
1 Day Post-Op Subjective: Patient reports mild right abdominal pain. Hgb 7.7 this morning. Negative flatus.   Objective: Vital signs in last 24 hours: Temp:  [97.7 F (36.5 C)-98.7 F (37.1 C)] 98.7 F (37.1 C) (10/30 1424) Pulse Rate:  [88-95] 95 (10/30 1424) Resp:  [17-20] 18 (10/30 1424) BP: (87-147)/(59-100) 147/93 (10/30 1424) SpO2:  [96 %-100 %] 99 % (10/30 1424)  Intake/Output from previous day: 10/29 0701 - 10/30 0700 In: 3343.2 [P.O.:420; I.V.:2873.2; IV Piggyback:50] Out: [id number] [Urine:570; Drains:45; Blood:400] Intake/Output this shift: Total I/O In: 300 [Blood:300] Out: 307 [Urine:300; Drains:7]  Physical Exam:  General:alert, cooperative and appears stated age GI: soft, non tender, normal bowel sounds, no palpable masses, no organomegaly, no inguinal hernia Female genitalia: not done Extremities: extremities normal, atraumatic, no cyanosis or edema  Lab Results: Recent Labs    07/28/19 0552 07/28/19 1156 07/29/19 0439  HGB 9.5* 9.1* 7.7*  HCT 31.2* 30.0* 25.5*   BMET Recent Labs    07/29/19 0439  NA 138  K 4.2  CL 103  CO2 26  GLUCOSE 150*  BUN 16  CREATININE 1.26*  CALCIUM 8.7*   No results for input(s): LABPT, INR in the last 72 hours. No results for input(s): LABURIN in the last 72 hours. Results for orders placed or performed during the hospital encounter of 07/27/19  MRSA PCR Screening     Status: None   Collection Time: 07/27/19  6:12 PM   Specimen: Nasopharyngeal  Result Value Ref Range Status   MRSA by PCR NEGATIVE NEGATIVE Final    Comment:        The GeneXpert MRSA Assay (FDA approved for NASAL specimens only), is one component of a comprehensive MRSA colonization surveillance program. It is not intended to diagnose MRSA infection nor to guide or monitor treatment for MRSA infections. Performed at [facility], [place] [address], [place], [place] [address]     Studies/Results: No results  found.  Assessment/Plan: POD#1 right partial nephrectomy  1. 1 unit pRBCs due to acute on chronic anemia 2. Clear liquid diet 3. D/c foley 4. Ambulate with assistance  LOS: 2 days   Nicolette Bang 07/29/2019, 6:06 PM

## 2019-07-29 NOTE — Plan of Care (Signed)
  Problem: Clinical Measurements: Goal: Diagnostic test results will improve Outcome: Progressing Goal: Respiratory complications will improve Outcome: Progressing   Problem: Nutrition: Goal: Adequate nutrition will be maintained Outcome: Progressing   Problem: Safety: Goal: Ability to remain free from injury will improve Outcome: Progressing   Problem: Education: Goal: Knowledge of the prescribed therapeutic regimen will improve Outcome: Progressing   Problem: Respiratory: Goal: Ability to achieve and maintain a regular respiratory rate will improve Outcome: Progressing

## 2019-07-29 NOTE — Plan of Care (Signed)
  Problem: Clinical Measurements: Goal: Will remain free from infection Outcome: Progressing Goal: Respiratory complications will improve 07/29/2019 1934 by Andre Lefort, RN Outcome: Progressing 07/29/2019 1141 by Andre Lefort, RN Outcome: Progressing Goal: Cardiovascular complication will be avoided Outcome: Progressing   Problem: Activity: Goal: Risk for activity intolerance will decrease Outcome: Progressing   Problem: Nutrition: Goal: Adequate nutrition will be maintained 07/29/2019 1934 by Andre Lefort, RN Outcome: Progressing 07/29/2019 1141 by Andre Lefort, RN Outcome: Progressing   Problem: Pain Managment: Goal: General experience of comfort will improve Outcome: Progressing   Problem: Safety: Goal: Ability to remain free from injury will improve Outcome: Progressing   Problem: Clinical Measurements: Goal: Postoperative complications will be avoided or minimized Outcome: Progressing   Problem: Respiratory: Goal: Ability to achieve and maintain a regular respiratory rate will improve Outcome: Progressing

## 2019-07-30 LAB — CBC
HCT: 30 % — ABNORMAL LOW (ref 36.0–46.0)
Hemoglobin: 9.2 g/dL — ABNORMAL LOW (ref 12.0–15.0)
MCH: 25.8 pg — ABNORMAL LOW (ref 26.0–34.0)
MCHC: 30.7 g/dL (ref 30.0–36.0)
MCV: 84 fL (ref 80.0–100.0)
Platelets: 311 10*3/uL (ref 150–400)
RBC: 3.57 MIL/uL — ABNORMAL LOW (ref 3.87–5.11)
RDW: 16.1 % — ABNORMAL HIGH (ref 11.5–15.5)
WBC: 14.9 10*3/uL — ABNORMAL HIGH (ref 4.0–10.5)
nRBC: 0 % (ref 0.0–0.2)

## 2019-07-30 LAB — GLUCOSE, CAPILLARY
Glucose-Capillary: 190 mg/dL — ABNORMAL HIGH (ref 70–99)
Glucose-Capillary: 181 mg/dL — ABNORMAL HIGH (ref 70–99)
Glucose-Capillary: 144 mg/dL — ABNORMAL HIGH (ref 70–99)
Glucose-Capillary: 202 mg/dL — ABNORMAL HIGH (ref 70–99)

## 2019-07-30 LAB — BASIC METABOLIC PANEL
Anion gap: 12 (ref 5–15)
BUN: 21 mg/dL — ABNORMAL HIGH (ref 6–20)
CO2: 20 mmol/L — ABNORMAL LOW (ref 22–32)
Calcium: 8.7 mg/dL — ABNORMAL LOW (ref 8.9–10.3)
Chloride: 99 mmol/L (ref 98–111)
Creatinine, Ser: 1.34 mg/dL — ABNORMAL HIGH (ref 0.44–1.00)
GFR calc Af Amer: 57 mL/min — ABNORMAL LOW (ref >60)
GFR calc non Af Amer: 49 mL/min — ABNORMAL LOW (ref >60)
Glucose, Bld: 186 mg/dL — ABNORMAL HIGH (ref 70–99)
Potassium: 3.8 mmol/L (ref 3.5–5.1)
Sodium: 131 mmol/L — ABNORMAL LOW (ref 135–145)

## 2019-07-30 NOTE — Progress Notes (Signed)
2 Days Post-Op   Subjective/Chief Complaint:  1 - Right Renal Mass - s/p RIGHT robotic partial nephrectomy 07/28/19 by McKenzie. Path pending. JP remains.  2- Acute on Chronic Anemia - long h/o chronic anemia. Given 2u pRBC PRE-op 10/28 for Hgb 7.5. 10/20 - surgyer, 460mL EBL 10/30 - Hgb 7.7 --> 1u pRBC, JP output minimal 10/31 - Hgb 9.2, JP output minimal --> JP removed  Today "Sherri Dixon" is stable. JP output remains low. Tollerating. Clears. Getting ancy for discharge. No complaints.    Objective: Vital signs in last 24 hours: Temp:  [98.2 F (36.8 C)-98.7 F (37.1 C)] 98.4 F (36.9 C) (10/31 0410) Pulse Rate:  [87-95] 93 (10/31 0410) Resp:  [17-20] 18 (10/31 0410) BP: (97-147)/(59-100) 123/81 (10/31 0410) SpO2:  [90 %-100 %] 99 % (10/31 0410) Last BM Date: 07/27/19  Intake/Output from previous day: 10/30 0701 - 10/31 0700 In: 2253.8 [I.V.:1953.8; Blood:300] Out: 652 [Urine:600; Drains:52] Intake/Output this shift: No intake/output data recorded.  General appearance: alert and cooperative Ears: normal TM's and external ear canals both ears Nose: Nares normal. Septum midline. Mucosa normal. No drainage or sinus tenderness. Throat: lips, mucosa, and tongue normal; teeth and gums normal Neck: supple, symmetrical, trachea midline Back: symmetric, no curvature. ROM normal. No CVA tenderness. Resp: Non-labored on room air.  Cardio: Nl rate GI: soft, non-tender; bowel sounds normal; no masses,  no organomegaly and large truncal obesity. Recent srugical sites c/d/i. JP removed as output serosanguinous / non-foul and scant. Dry dressing placed.  Pelvic: external genitalia normal Extremities: extremities normal, atraumatic, no cyanosis or edema and RUE PICC in place w/o hematomas or site reaction.  Pulses: 2+ and symmetric Neurologic: Grossly normal Incision/Wound: as per above   Lab Results:  Recent Labs    07/28/19 1156 07/29/19 0439  HGB 9.1* 7.7*  HCT 30.0* 25.5*    BMET Recent Labs    07/29/19 0439 07/30/19 0500  NA 138 131*  K 4.2 3.8  CL 103 99  CO2 26 20*  GLUCOSE 150* 186*  BUN 16 21*  CREATININE 1.26* 1.34*  CALCIUM 8.7* 8.7*   PT/INR No results for input(s): LABPROT, INR in the last 72 hours. ABG No results for input(s): PHART, HCO3 in the last 72 hours.  Invalid input(s): PCO2, PO2  Studies/Results: No results found.  Anti-infectives: Anti-infectives (From admission, onward)   Start     Dose/Rate Route Frequency Ordered Stop   07/28/19 0600  ceFAZolin (ANCEF) 3 g in dextrose 5 % 50 mL IVPB     3 g 100 mL/hr over 30 Minutes Intravenous 30 min pre-op 07/27/19 0806 07/28/19 0805      Assessment/Plan:  1 - Right Renal Mass - doing well POD 2. Adv to carb mod diet. Jp out today.   2- Acute on Chronic Anemia - improved after transfusion.  Remian in house. Hgb in AM, likely DC tomorrow, she voiced understanding and agreement.   Alexis Frock 07/30/2019

## 2019-07-31 ENCOUNTER — Inpatient Hospital Stay (HOSPITAL_COMMUNITY): Payer: 59

## 2019-07-31 LAB — SURGICAL PATHOLOGY

## 2019-07-31 LAB — BPAM RBC
Blood Product Expiration Date: 202011252359
Blood Product Expiration Date: 202011252359
Blood Product Expiration Date: 202011252359
ISSUE DATE / TIME: 202010282219
ISSUE DATE / TIME: 202010290119
ISSUE DATE / TIME: 202010301126
Unit Type and Rh: 6200
Unit Type and Rh: 6200
Unit Type and Rh: 6200

## 2019-07-31 LAB — CBC
HCT: 26.7 % — ABNORMAL LOW (ref 36.0–46.0)
HCT: 29.5 % — ABNORMAL LOW (ref 36.0–46.0)
Hemoglobin: 7.9 g/dL — ABNORMAL LOW (ref 12.0–15.0)
Hemoglobin: 8.9 g/dL — ABNORMAL LOW (ref 12.0–15.0)
MCH: 25 pg — ABNORMAL LOW (ref 26.0–34.0)
MCH: 25.4 pg — ABNORMAL LOW (ref 26.0–34.0)
MCHC: 29.6 g/dL — ABNORMAL LOW (ref 30.0–36.0)
MCHC: 30.2 g/dL (ref 30.0–36.0)
MCV: 84.5 fL (ref 80.0–100.0)
MCV: 84 fL (ref 80.0–100.0)
Platelets: 312 10*3/uL (ref 150–400)
Platelets: 361 10*3/uL (ref 150–400)
RBC: 3.16 MIL/uL — ABNORMAL LOW (ref 3.87–5.11)
RBC: 3.51 MIL/uL — ABNORMAL LOW (ref 3.87–5.11)
RDW: 15.9 % — ABNORMAL HIGH (ref 11.5–15.5)
RDW: 15.9 % — ABNORMAL HIGH (ref 11.5–15.5)
WBC: 12.1 10*3/uL — ABNORMAL HIGH (ref 4.0–10.5)
WBC: 15 10*3/uL — ABNORMAL HIGH (ref 4.0–10.5)
nRBC: 0 % (ref 0.0–0.2)
nRBC: 0 % (ref 0.0–0.2)

## 2019-07-31 LAB — TYPE AND SCREEN
ABO/RH(D): A POS
Antibody Screen: NEGATIVE
Unit division: 0
Unit division: 0
Unit division: 0

## 2019-07-31 LAB — GLUCOSE, CAPILLARY
Glucose-Capillary: 244 mg/dL — ABNORMAL HIGH (ref 70–99)
Glucose-Capillary: 221 mg/dL — ABNORMAL HIGH (ref 70–99)
Glucose-Capillary: 215 mg/dL — ABNORMAL HIGH (ref 70–99)

## 2019-07-31 IMAGING — DX DG CHEST 1V PORT
1 series · 1 of 1 positions shown · non-contrast
Comparison: [DATE]

CLINICAL DATA: Shortness of breath. Asthma. Congestive heart
failure.

EXAM:
PORTABLE CHEST 1 VIEW

[chest ap]
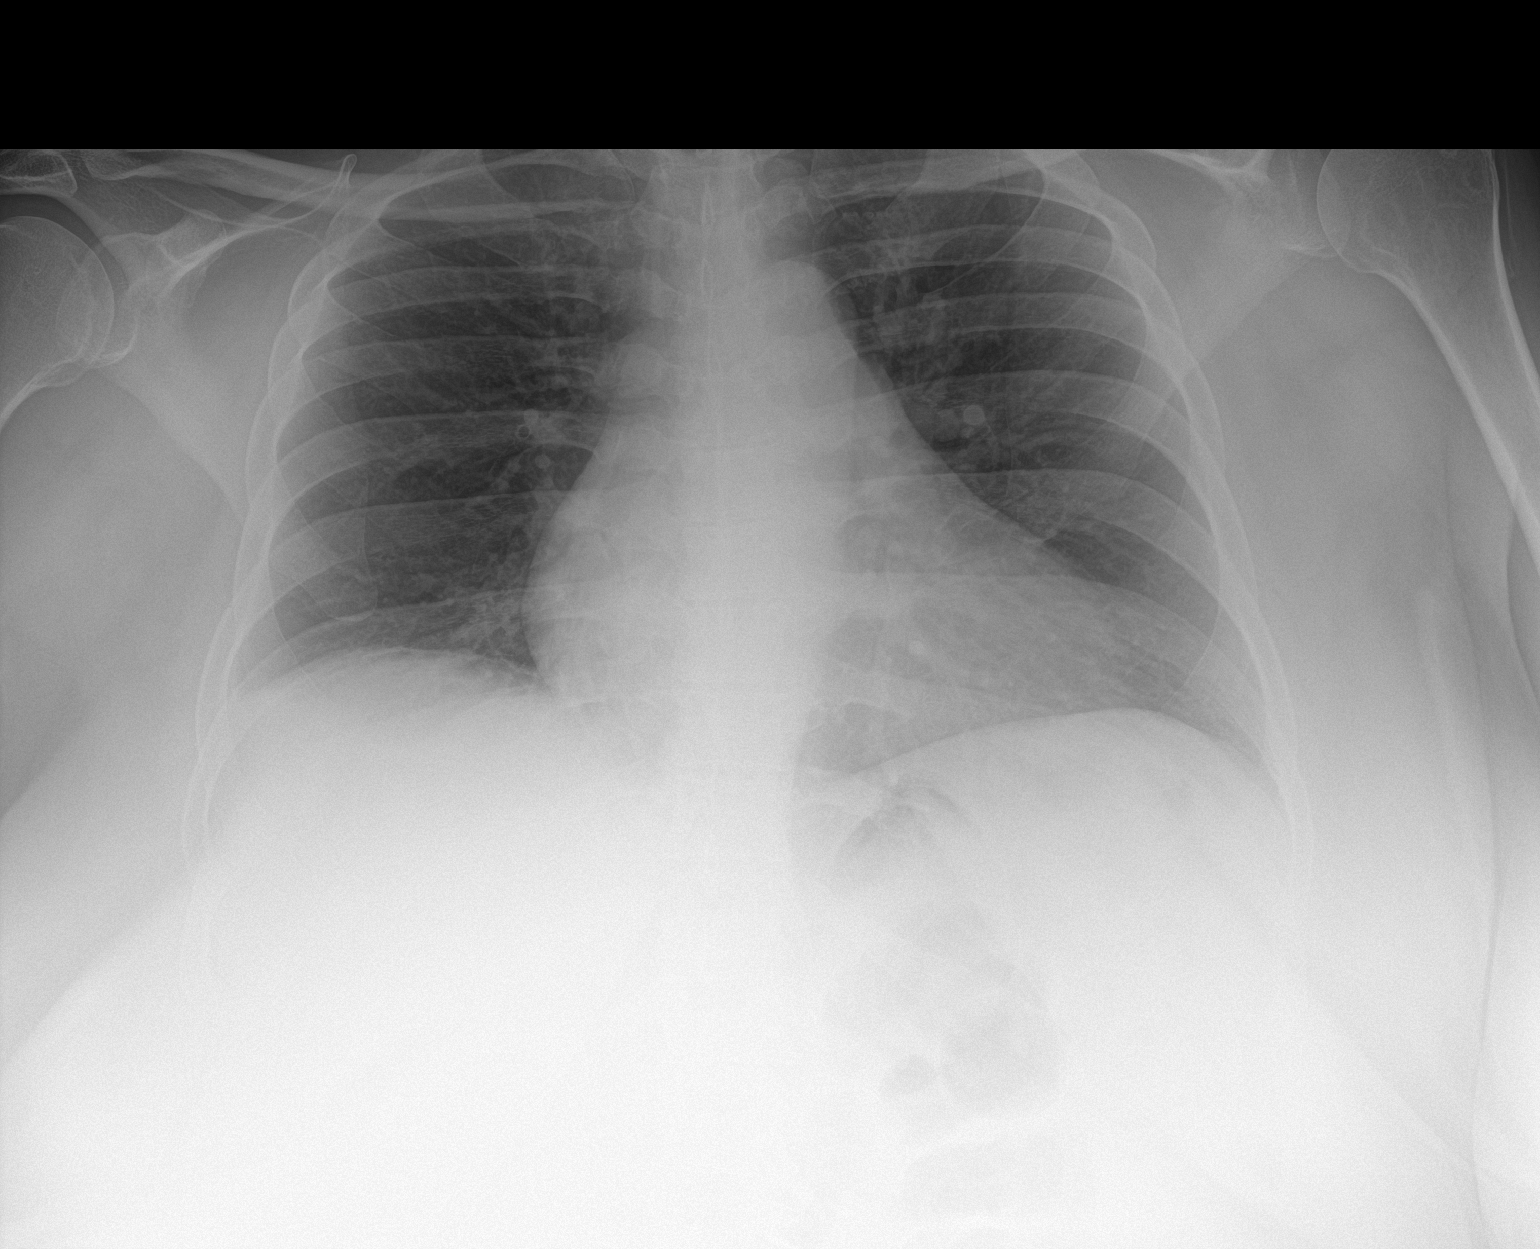

[1 of 1 positions shown; findings below may reference images not displayed]

FINDINGS: The heart size and mediastinal contours are within normal limits.
Both lungs are clear. The visualized skeletal structures are
unremarkable.
IMPRESSION: No active disease.

## 2019-07-31 MED ORDER — PHENOL 1.4 % MT LIQD
1.0000 | OROMUCOSAL | Status: DC | PRN
  Filled 2019-07-31: qty 177

## 2019-07-31 NOTE — Progress Notes (Signed)
3 Days Post-Op Subjective: Patient reports good pain control. Urinating without issue. HGB 7.9 this morning. Mild shortness of breath  Objective: Vital signs in last 24 hours: Temp:  [98.5 F (36.9 C)-99.1 F (37.3 C)] 99.1 F (37.3 C) (11/01 0454) Pulse Rate:  [80-97] 90 (11/01 0943) Resp:  [18-20] 20 (11/01 0454) BP: (111-145)/(75-94) 111/94 (11/01 0943) SpO2:  [94 %-100 %] 95 % (11/01 0454)  Intake/Output from previous day: No intake/output data recorded. Intake/Output this shift: No intake/output data recorded.  Physical Exam:  General:alert, cooperative and appears stated age GI: soft, non tender, normal bowel sounds, no palpable masses, no organomegaly, no inguinal hernia Female genitalia: not done Extremities: extremities normal, atraumatic, no cyanosis or edema  Lab Results: Recent Labs    07/29/19 0439 07/30/19 0749 07/31/19 0518  HGB 7.7* 9.2* 7.9*  HCT 25.5* 30.0* 26.7*   BMET Recent Labs    07/29/19 0439 07/30/19 0500  NA 138 131*  K 4.2 3.8  CL 103 99  CO2 26 20*  GLUCOSE 150* 186*  BUN 16 21*  CREATININE 1.26* 1.34*  CALCIUM 8.7* 8.7*   No results for input(s): LABPT, INR in the last 72 hours. No results for input(s): LABURIN in the last 72 hours. Results for orders placed or performed during the hospital encounter of 07/27/19  MRSA PCR Screening     Status: None   Collection Time: 07/27/19  6:12 PM   Specimen: Nasopharyngeal  Result Value Ref Range Status   MRSA by PCR NEGATIVE NEGATIVE Final    Comment:        The GeneXpert MRSA Assay (FDA approved for NASAL specimens only), is one component of a comprehensive MRSA colonization surveillance program. It is not intended to diagnose MRSA infection nor to guide or monitor treatment for MRSA infections. Performed at [facility], [place] [address] [place], [place] [address]     Studies/Results: No results found.  Assessment/Plan: POD#3 partial nephrectomy  1. CXR   2. Recheck hemoglobin at 1500, if table patient wishes to be discharged home   LOS: 4 days   Nicolette Bang 07/31/2019, 12:25 PM

## 2019-07-31 NOTE — Progress Notes (Signed)
AVS given to patient and explained at the bedside. Medications and follow up appointments have been explained with pt verbalizing understanding.  

## 2019-08-01 NOTE — Discharge Summary (Signed)
Physician Discharge Summary  Patient ID: Sherri Dixon MRN: [id number] DOB/AGE: Dec 08, 1980 38 y.o.  Admit date: 07/27/2019 Discharge date: 08/01/2019  Admission Diagnoses:  Renal mass  Discharge Diagnoses:  Active Problems:   Anemia   Past Medical History:  Diagnosis Date  . Anemia   . Asthma   . CHF (congestive heart failure) (HCC)    Pt reports that father has CHF, not her  . Diabetes mellitus without complication ([place])   . Dyslipidemia   . GERD (gastroesophageal reflux disease)   . Pulmonary embolism ([place])     Surgeries: Procedure(s): XI ROBOTIC ASSITED PARTIAL NEPHRECTOMY on 07/28/2019   Consultants (if any):   Discharged Condition: Improved  Hospital Course: Sherri Dixon is an 38 y.o. female who was admitted 07/27/2019 with a diagnosis of renal mass and went to the operating room on 07/28/2019 and underwent the above named procedures.  She did well post op and was discharge on POD#3  She was given perioperative antibiotics:  Anti-infectives (From admission, onward)   Start     Dose/Rate Route Frequency Ordered Stop   07/28/19 0600  ceFAZolin (ANCEF) 3 g in dextrose 5 % 50 mL IVPB     3 g 100 mL/hr over 30 Minutes Intravenous 30 min pre-op 07/27/19 0806 07/28/19 0805    .  She was given sequential compression devices, early ambulation for DVT prophylaxis.  She benefited maximally from the hospital stay and there were no complications.    Recent vital signs:  Vitals:   07/31/19 0943 07/31/19 1247  BP: (!) 111/94 (!) 140/95  Pulse: 90 87  Resp:  19  Temp:  98.8 F (37.1 C)  SpO2:  100%    Recent laboratory studies:  Lab Results  Component Value Date   HGB 8.9 (L) 07/31/2019   HGB 7.9 (L) 07/31/2019   HGB 9.2 (L) 07/30/2019   Lab Results  Component Value Date   WBC 15.0 (H) 07/31/2019   PLT 361 07/31/2019   No results found for: INR Lab Results  Component Value Date   NA 131 (L) 07/30/2019   K 3.8 07/30/2019   CL 99 07/30/2019   CO2 20 (L)  07/30/2019   BUN 21 (H) 07/30/2019   CREATININE 1.34 (H) 07/30/2019   GLUCOSE 186 (H) 07/30/2019    Discharge Medications:   Allergies as of 07/31/2019      Reactions   Humalog Kwikpen [insulin Lispro] Other (See Comments)   75/50---swelling and ulcers in mouth       Medication List    STOP taking these medications   CENTRUM ADULTS PO   cyanocobalamin 1000 MCG tablet   Xarelto 20 MG Tabs tablet Generic drug: rivaroxaban   zinc gluconate 50 MG tablet     TAKE these medications   acetaminophen 500 MG tablet Commonly known as: TYLENOL Take 1,000 mg by mouth every 6 (six) hours as needed for moderate pain.   Alcohol Swabs 70 % Pads 1 application by Does not apply route 3 (three) times daily before meals.   AZO TABS PO Take 1 tablet by mouth daily.   Basaglar KwikPen 100 UNIT/ML Sopn Inject 40-71 Units into the skin at bedtime.   cetirizine 10 MG tablet Commonly known as: ZYRTEC Take 10 mg by mouth daily as needed for allergies.   furosemide 20 MG tablet Commonly known as: LASIX Take 40 mg by mouth daily.   gabapentin 300 MG capsule Commonly known as: NEURONTIN Take 300 mg by mouth daily as needed (pain).  glucose blood test strip Use as instructed   HYDROcodone-acetaminophen 5-325 MG tablet Commonly known as: Norco Take 1-2 tablets by mouth every 6 (six) hours as needed for moderate pain.   INSULIN SYRINGE 1CC/28G 28G X 1/2" 1 ML Misc 1 application by Does not apply route 3 (three) times daily before meals.   metoprolol succinate 25 MG 24 hr tablet Commonly known as: TOPROL-XL Take 25 mg by mouth daily.   NovoLOG FlexPen 100 UNIT/ML FlexPen Generic drug: insulin aspart Inject 12-20 Units as directed 3 (three) times daily.   olmesartan 40 MG tablet Commonly known as: BENICAR Take 40 mg by mouth daily.   potassium chloride SA 20 MEQ tablet Commonly known as: KLOR-CON Take 20 mEq by mouth daily.   simvastatin 20 MG tablet Commonly known as:  ZOCOR Take 20 mg by mouth at bedtime.       Diagnostic Studies: Dg Chest Port 1 View  Result Date: 07/31/2019 CLINICAL DATA:  Shortness of breath. Asthma. Congestive heart failure. EXAM: PORTABLE CHEST 1 VIEW COMPARISON:  03/10/2019 FINDINGS: The heart size and mediastinal contours are within normal limits. Both lungs are clear. The visualized skeletal structures are unremarkable. IMPRESSION: No active disease. Electronically Signed   By: Marlaine Hind M.D.   On: 07/31/2019 15:26    Disposition:     Follow-up Information    Karen Kays, NP On 08/11/2019.   Specialty: Nurse Practitioner Why: at 9:30 Contact information: [address] [place] [address] [phone]            Signed: Nicolette Bang 08/01/2019, 1:03 PM

## 2019-11-08 ENCOUNTER — Other Ambulatory Visit: Payer: Self-pay

## 2019-11-08 ENCOUNTER — Ambulatory Visit (HOSPITAL_COMMUNITY)
Admission: RE | Admit: 2019-11-08 | Discharge: 2019-11-08 | Disposition: A | Discharge status: Home / Self Care | Payer: 59 | Source: Ambulatory Visit | Attending: Nurse Practitioner | Admitting: Nurse Practitioner

## 2019-11-08 ENCOUNTER — Other Ambulatory Visit (HOSPITAL_COMMUNITY): Payer: Self-pay | Admitting: Nurse Practitioner

## 2019-11-08 DIAGNOSIS — C641 Malignant neoplasm of right kidney, except renal pelvis: Secondary | ICD-10-CM | POA: Diagnosis not present

## 2019-11-08 IMAGING — CR DG CHEST 2V
2 series · 2 of 2 positions shown · non-contrast
Comparison: [DATE]

CLINICAL DATA: Right renal cell carcinoma, asthma

EXAM:
CHEST - 2 VIEW

[w chest pa]
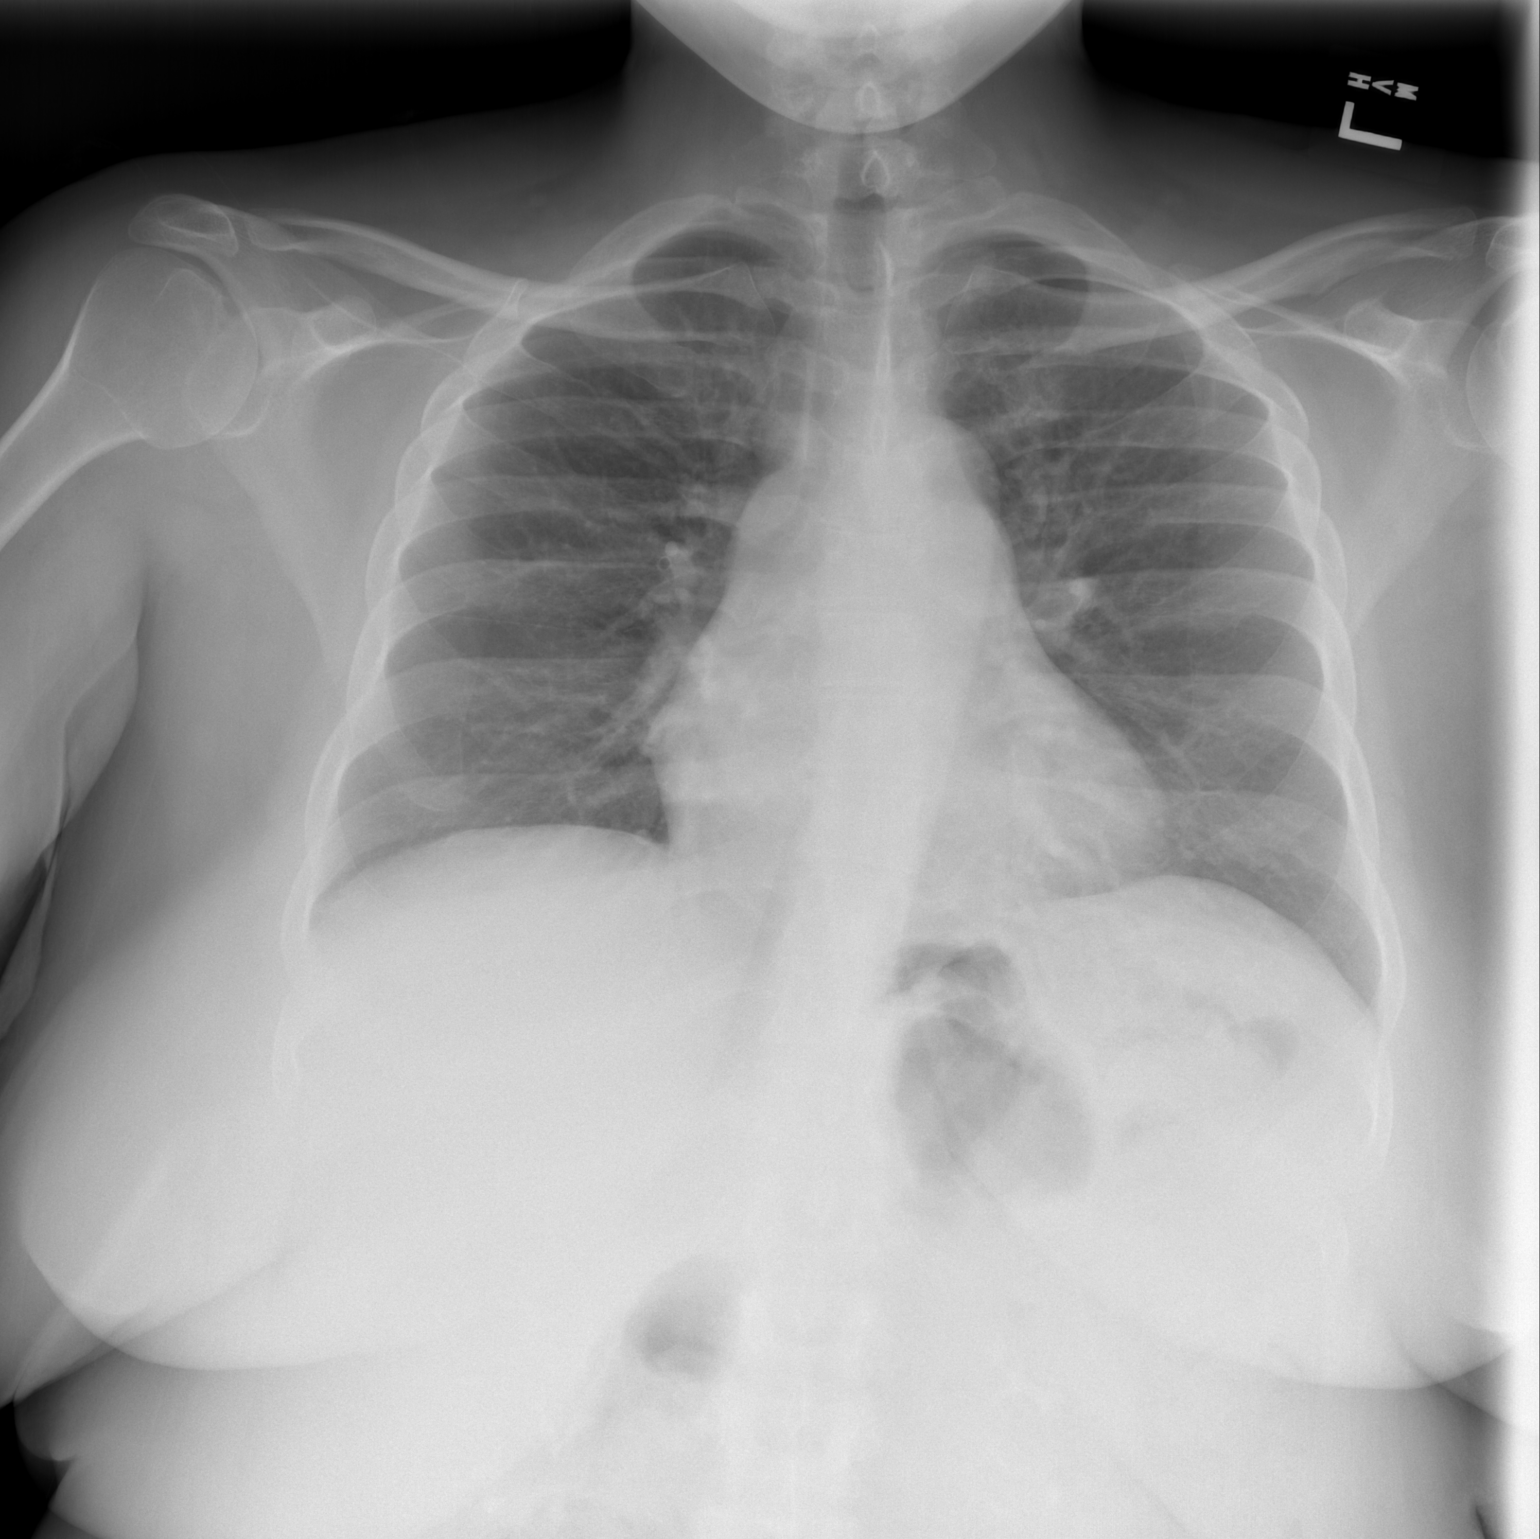

[w chest lat]
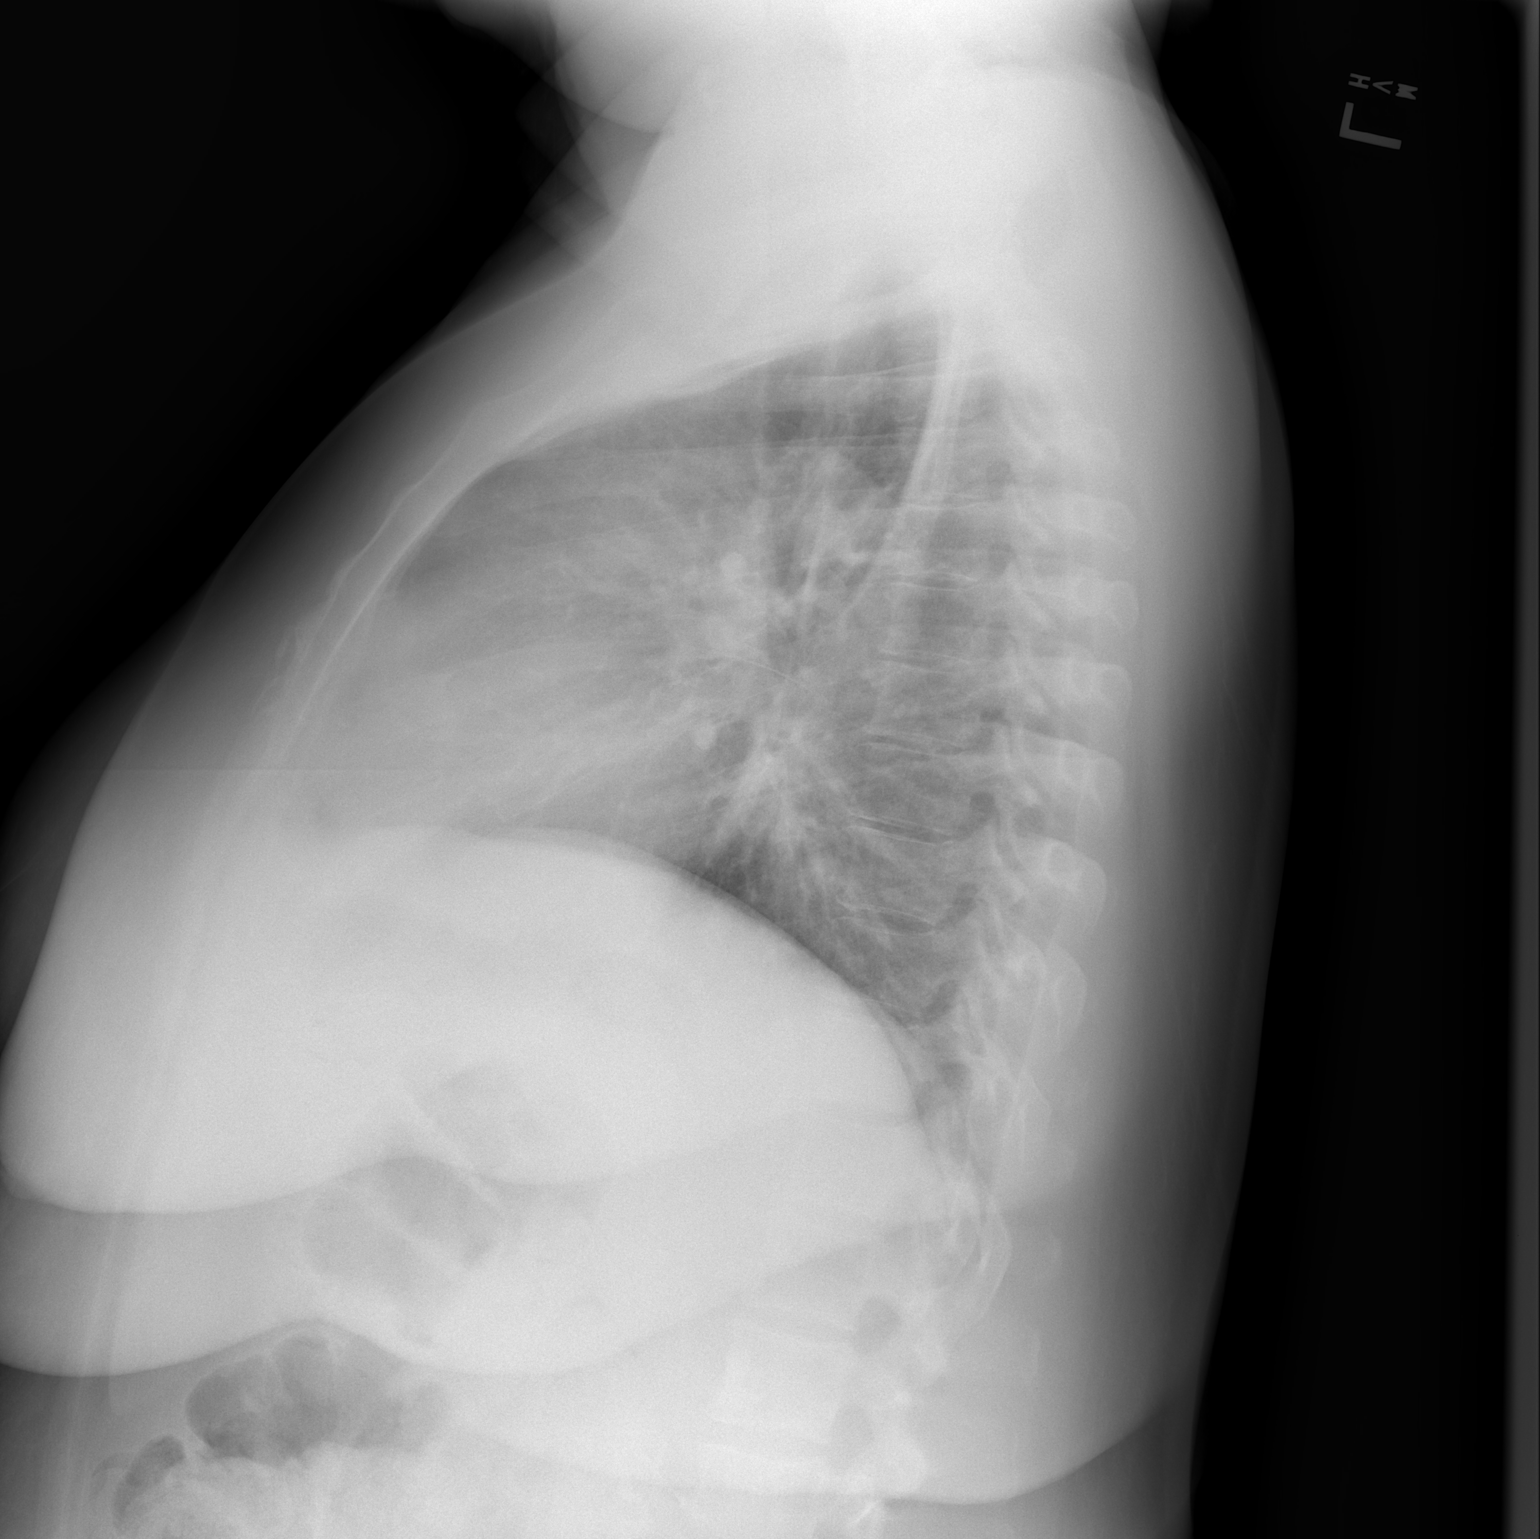

[2 of 2 positions shown; findings below may reference images not displayed]

FINDINGS: The heart size and mediastinal contours are within normal limits.
Both lungs are clear. The visualized skeletal structures are
unremarkable.
IMPRESSION: No active cardiopulmonary disease.

## 2020-11-05 ENCOUNTER — Ambulatory Visit: Payer: 59 | Admitting: Occupational Therapy

## 2020-11-07 ENCOUNTER — Ambulatory Visit: Payer: 59 | Admitting: Occupational Therapy

## 2020-11-12 ENCOUNTER — Encounter: Payer: 59 | Admitting: Occupational Therapy

## 2020-11-14 ENCOUNTER — Encounter: Payer: 59 | Admitting: Occupational Therapy

## 2020-11-19 ENCOUNTER — Encounter: Payer: 59 | Admitting: Occupational Therapy

## 2020-11-21 ENCOUNTER — Encounter: Payer: 59 | Admitting: Occupational Therapy

## 2020-11-26 ENCOUNTER — Encounter: Payer: 59 | Admitting: Occupational Therapy

## 2020-11-28 ENCOUNTER — Encounter: Payer: 59 | Admitting: Occupational Therapy

## 2020-12-03 ENCOUNTER — Encounter: Payer: 59 | Admitting: Occupational Therapy

## 2020-12-05 ENCOUNTER — Ambulatory Visit: Payer: 59 | Attending: Nurse Practitioner | Admitting: Occupational Therapy

## 2020-12-10 ENCOUNTER — Ambulatory Visit: Payer: 59 | Admitting: Occupational Therapy

## 2020-12-12 ENCOUNTER — Ambulatory Visit: Payer: 59 | Admitting: Occupational Therapy

## 2020-12-12 ENCOUNTER — Encounter (HOSPITAL_COMMUNITY): Payer: Self-pay

## 2020-12-12 ENCOUNTER — Inpatient Hospital Stay (HOSPITAL_COMMUNITY)
Admission: EM | Admit: 2020-12-12 | Discharge: 2020-12-15 | DRG: 291 | Disposition: A | Discharge status: Home / Self Care | Payer: 59 | Attending: Internal Medicine | Admitting: Internal Medicine

## 2020-12-12 ENCOUNTER — Emergency Department (HOSPITAL_COMMUNITY): Payer: 59

## 2020-12-12 ENCOUNTER — Other Ambulatory Visit: Payer: Self-pay

## 2020-12-12 DIAGNOSIS — Z9114 Patient's other noncompliance with medication regimen: Secondary | ICD-10-CM

## 2020-12-12 DIAGNOSIS — Z888 Allergy status to other drugs, medicaments and biological substances status: Secondary | ICD-10-CM | POA: Diagnosis not present

## 2020-12-12 DIAGNOSIS — T465X6A Underdosing of other antihypertensive drugs, initial encounter: Secondary | ICD-10-CM | POA: Diagnosis present

## 2020-12-12 DIAGNOSIS — E876 Hypokalemia: Secondary | ICD-10-CM | POA: Diagnosis present

## 2020-12-12 DIAGNOSIS — E785 Hyperlipidemia, unspecified: Secondary | ICD-10-CM | POA: Diagnosis present

## 2020-12-12 DIAGNOSIS — K219 Gastro-esophageal reflux disease without esophagitis: Secondary | ICD-10-CM | POA: Diagnosis present

## 2020-12-12 DIAGNOSIS — E8809 Other disorders of plasma-protein metabolism, not elsewhere classified: Secondary | ICD-10-CM | POA: Diagnosis present

## 2020-12-12 DIAGNOSIS — Z7901 Long term (current) use of anticoagulants: Secondary | ICD-10-CM

## 2020-12-12 DIAGNOSIS — Z833 Family history of diabetes mellitus: Secondary | ICD-10-CM

## 2020-12-12 DIAGNOSIS — E1169 Type 2 diabetes mellitus with other specified complication: Secondary | ICD-10-CM | POA: Diagnosis not present

## 2020-12-12 DIAGNOSIS — Z794 Long term (current) use of insulin: Secondary | ICD-10-CM | POA: Diagnosis not present

## 2020-12-12 DIAGNOSIS — E669 Obesity, unspecified: Secondary | ICD-10-CM | POA: Diagnosis not present

## 2020-12-12 DIAGNOSIS — Z79899 Other long term (current) drug therapy: Secondary | ICD-10-CM | POA: Diagnosis not present

## 2020-12-12 DIAGNOSIS — I160 Hypertensive urgency: Secondary | ICD-10-CM | POA: Diagnosis present

## 2020-12-12 DIAGNOSIS — I5033 Acute on chronic diastolic (congestive) heart failure: Secondary | ICD-10-CM | POA: Diagnosis present

## 2020-12-12 DIAGNOSIS — Z905 Acquired absence of kidney: Secondary | ICD-10-CM

## 2020-12-12 DIAGNOSIS — I5023 Acute on chronic systolic (congestive) heart failure: Secondary | ICD-10-CM | POA: Diagnosis not present

## 2020-12-12 DIAGNOSIS — Z86711 Personal history of pulmonary embolism: Secondary | ICD-10-CM | POA: Diagnosis not present

## 2020-12-12 DIAGNOSIS — E1122 Type 2 diabetes mellitus with diabetic chronic kidney disease: Secondary | ICD-10-CM | POA: Diagnosis present

## 2020-12-12 DIAGNOSIS — I130 Hypertensive heart and chronic kidney disease with heart failure and stage 1 through stage 4 chronic kidney disease, or unspecified chronic kidney disease: Secondary | ICD-10-CM | POA: Diagnosis not present

## 2020-12-12 DIAGNOSIS — J45909 Unspecified asthma, uncomplicated: Secondary | ICD-10-CM | POA: Diagnosis present

## 2020-12-12 DIAGNOSIS — Z20822 Contact with and (suspected) exposure to covid-19: Secondary | ICD-10-CM | POA: Diagnosis present

## 2020-12-12 DIAGNOSIS — D509 Iron deficiency anemia, unspecified: Secondary | ICD-10-CM | POA: Diagnosis present

## 2020-12-12 DIAGNOSIS — I161 Hypertensive emergency: Secondary | ICD-10-CM | POA: Diagnosis present

## 2020-12-12 DIAGNOSIS — I509 Heart failure, unspecified: Secondary | ICD-10-CM | POA: Diagnosis not present

## 2020-12-12 DIAGNOSIS — Z8249 Family history of ischemic heart disease and other diseases of the circulatory system: Secondary | ICD-10-CM

## 2020-12-12 DIAGNOSIS — Z83438 Family history of other disorder of lipoprotein metabolism and other lipidemia: Secondary | ICD-10-CM

## 2020-12-12 DIAGNOSIS — Z6841 Body Mass Index (BMI) 40.0 and over, adult: Secondary | ICD-10-CM | POA: Diagnosis not present

## 2020-12-12 DIAGNOSIS — N182 Chronic kidney disease, stage 2 (mild): Secondary | ICD-10-CM | POA: Diagnosis present

## 2020-12-12 LAB — CBC WITH DIFFERENTIAL/PLATELET
Abs Immature Granulocytes: 0.05 10*3/uL (ref 0.00–0.07)
Basophils Absolute: 0 10*3/uL (ref 0.0–0.1)
Basophils Relative: 0 %
Eosinophils Absolute: 0.3 10*3/uL (ref 0.0–0.5)
Eosinophils Relative: 3 %
HCT: 27.9 % — ABNORMAL LOW (ref 36.0–46.0)
Hemoglobin: 8.3 g/dL — ABNORMAL LOW (ref 12.0–15.0)
Immature Granulocytes: 0 %
Lymphocytes Relative: 27 %
Lymphs Abs: 3.1 10*3/uL (ref 0.7–4.0)
MCH: 23.4 pg — ABNORMAL LOW (ref 26.0–34.0)
MCHC: 29.7 g/dL — ABNORMAL LOW (ref 30.0–36.0)
MCV: 78.8 fL — ABNORMAL LOW (ref 80.0–100.0)
Monocytes Absolute: 0.9 10*3/uL (ref 0.1–1.0)
Monocytes Relative: 8 %
Neutro Abs: 7.1 10*3/uL (ref 1.7–7.7)
Neutrophils Relative %: 62 %
Platelets: 336 10*3/uL (ref 150–400)
RBC: 3.54 MIL/uL — ABNORMAL LOW (ref 3.87–5.11)
RDW: 17.2 % — ABNORMAL HIGH (ref 11.5–15.5)
WBC: 11.6 10*3/uL — ABNORMAL HIGH (ref 4.0–10.5)
nRBC: 0 % (ref 0.0–0.2)

## 2020-12-12 LAB — COMPREHENSIVE METABOLIC PANEL
ALT: 20 U/L (ref 0–44)
AST: 28 U/L (ref 15–41)
Albumin: 1.9 g/dL — ABNORMAL LOW (ref 3.5–5.0)
Alkaline Phosphatase: 96 U/L (ref 38–126)
Anion gap: 7 (ref 5–15)
BUN: 10 mg/dL (ref 6–20)
CO2: 26 mmol/L (ref 22–32)
Calcium: 8.5 mg/dL — ABNORMAL LOW (ref 8.9–10.3)
Chloride: 107 mmol/L (ref 98–111)
Creatinine, Ser: 1.04 mg/dL — ABNORMAL HIGH (ref 0.44–1.00)
GFR, Estimated: >60 mL/min (ref >60)
Glucose, Bld: 115 mg/dL — ABNORMAL HIGH (ref 70–99)
Potassium: 3.4 mmol/L — ABNORMAL LOW (ref 3.5–5.1)
Sodium: 140 mmol/L (ref 135–145)
Total Bilirubin: 0.4 mg/dL (ref 0.3–1.2)
Total Protein: 5.8 g/dL — ABNORMAL LOW (ref 6.5–8.1)

## 2020-12-12 LAB — TROPONIN I (HIGH SENSITIVITY)
Troponin I (High Sensitivity): 34 ng/L — ABNORMAL HIGH (ref <18)
Troponin I (High Sensitivity): 35 ng/L — ABNORMAL HIGH (ref <18)

## 2020-12-12 LAB — BRAIN NATRIURETIC PEPTIDE: B Natriuretic Peptide: 294.8 pg/mL — ABNORMAL HIGH (ref 0.0–100.0)

## 2020-12-12 LAB — RESP PANEL BY RT-PCR (FLU A&B, COVID) ARPGX2
Influenza A by PCR: NEGATIVE
Influenza B by PCR: NEGATIVE
SARS Coronavirus 2 by RT PCR: NEGATIVE

## 2020-12-12 IMAGING — DX DG CHEST 1V PORT
1 series · 1 of 1 positions shown · non-contrast
Comparison: [DATE], CT [DATE]

CLINICAL DATA: Shortness of breath

EXAM:
PORTABLE CHEST 1 VIEW

[chest ap]
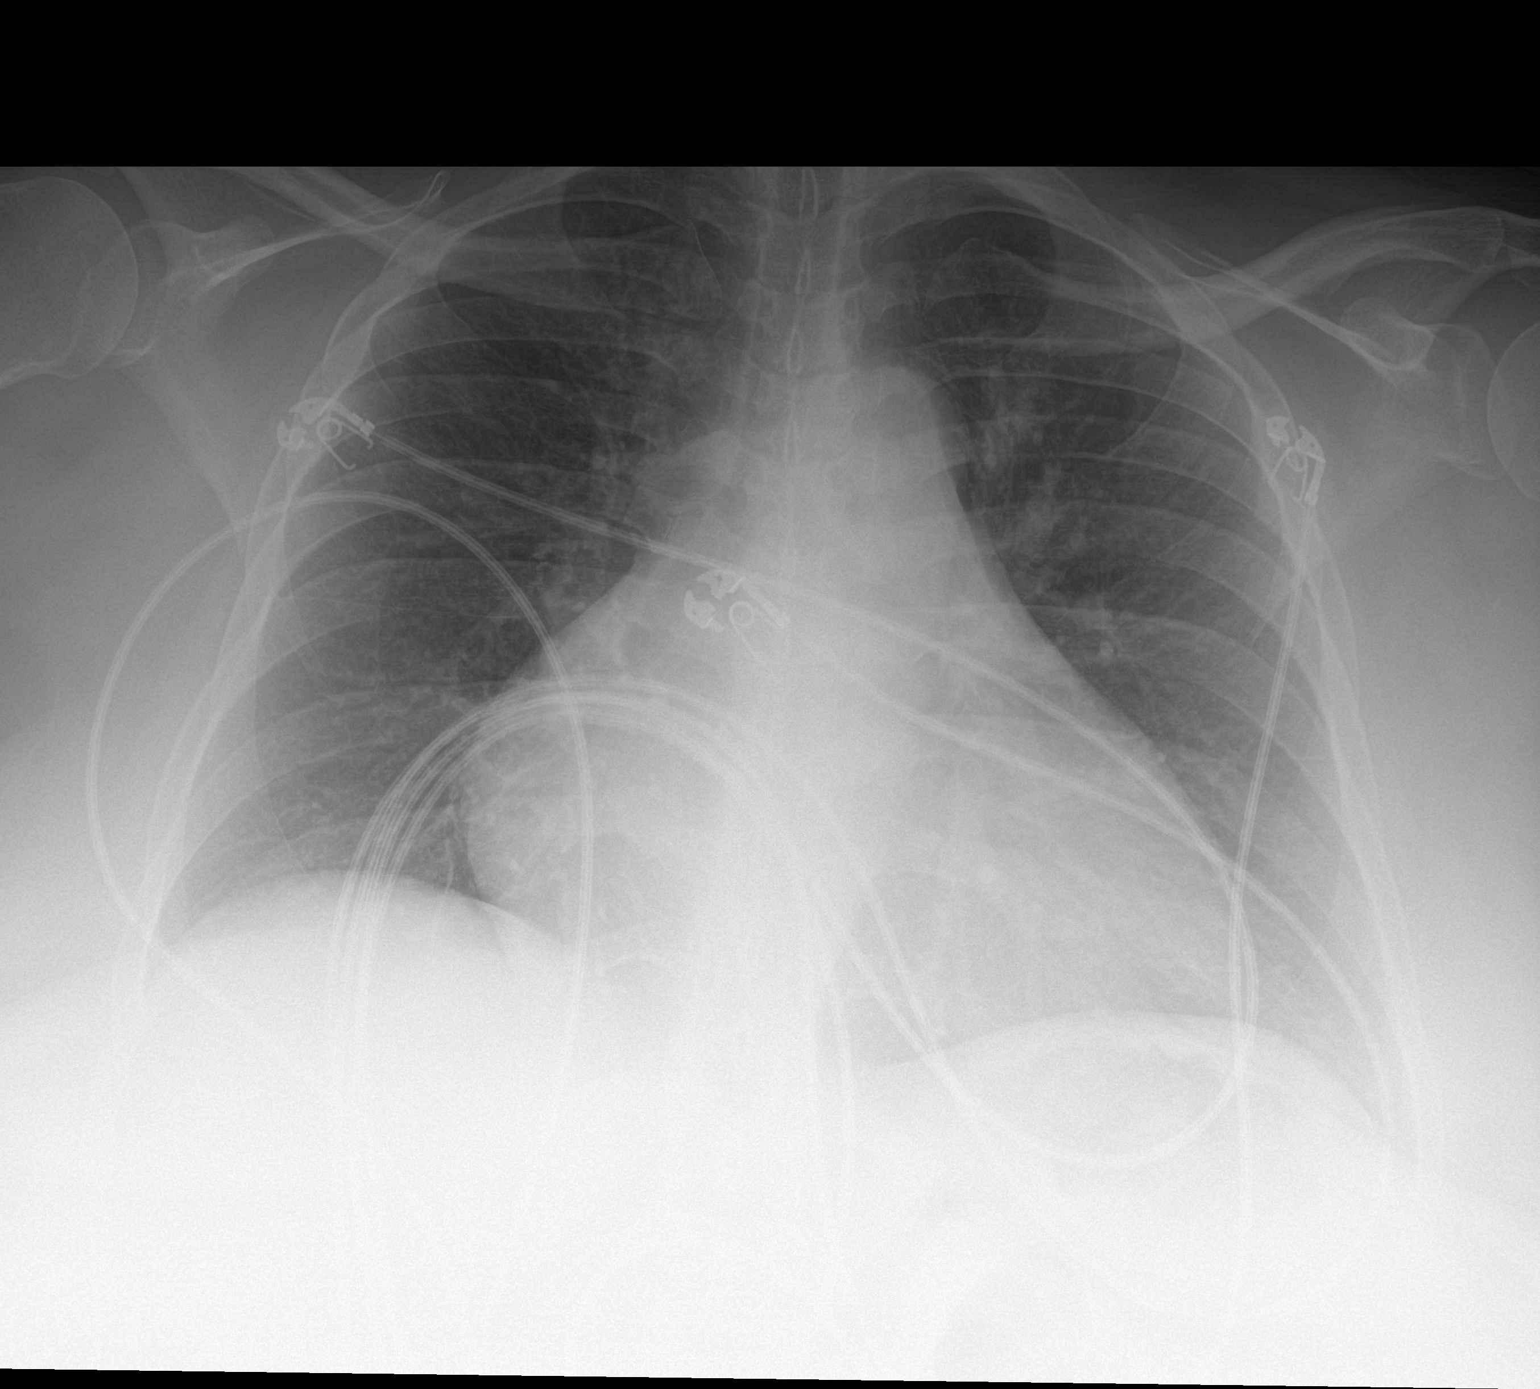

[1 of 1 positions shown; findings below may reference images not displayed]

FINDINGS: Interval cardiomegaly with vascular congestion. No consolidation,
pleural effusion, or pneumothorax.
IMPRESSION: Interval cardiomegaly with vascular congestion. Cardiomegaly could
be secondary to multi chamber enlargement versus pericardial
effusion.

## 2020-12-12 MED ORDER — POTASSIUM CHLORIDE CRYS ER 20 MEQ PO TBCR: 20.0000 meq | EXTENDED_RELEASE_TABLET | Freq: Every day | ORAL | Status: DC

## 2020-12-12 MED ORDER — RIVAROXABAN 20 MG PO TABS
20.0000 mg | ORAL_TABLET | Freq: Every day | ORAL | Status: DC
  Administered 2020-12-13 – 2020-12-15 (×3): 20 mg via ORAL
  Filled 2020-12-12 (×3): qty 1

## 2020-12-12 MED ORDER — POTASSIUM CHLORIDE 10 MEQ/100ML IV SOLN
10.0000 meq | Freq: Once | INTRAVENOUS | Status: AC
  Administered 2020-12-12: 10 meq via INTRAVENOUS
  Filled 2020-12-12: qty 100

## 2020-12-12 MED ORDER — FUROSEMIDE 10 MG/ML IJ SOLN
40.0000 mg | Freq: Once | INTRAMUSCULAR | Status: AC
  Administered 2020-12-12: 40 mg via INTRAVENOUS
  Filled 2020-12-12: qty 4

## 2020-12-12 MED ORDER — IRBESARTAN 150 MG PO TABS
300.0000 mg | ORAL_TABLET | Freq: Every day | ORAL | Status: DC
  Administered 2020-12-13 – 2020-12-15 (×3): 300 mg via ORAL
  Filled 2020-12-12: qty 1
  Filled 2020-12-12 (×2): qty 2

## 2020-12-12 MED ORDER — HYDRALAZINE HCL 25 MG PO TABS
25.0000 mg | ORAL_TABLET | Freq: Four times a day (QID) | ORAL | Status: DC
  Administered 2020-12-13 (×5): 25 mg via ORAL
  Filled 2020-12-12 (×5): qty 1

## 2020-12-12 MED ORDER — INSULIN ASPART 100 UNIT/ML ~~LOC~~ SOLN
0.0000 [IU] | Freq: Three times a day (TID) | SUBCUTANEOUS | Status: DC
  Administered 2020-12-13: 2 [IU] via SUBCUTANEOUS
  Administered 2020-12-13 (×2): 3 [IU] via SUBCUTANEOUS
  Filled 2020-12-12: qty 0.09

## 2020-12-12 MED ORDER — ACETAMINOPHEN 325 MG PO TABS: 650.0000 mg | ORAL_TABLET | Freq: Four times a day (QID) | ORAL | Status: DC | PRN

## 2020-12-12 MED ORDER — INSULIN GLARGINE 100 UNIT/ML ~~LOC~~ SOLN
70.0000 [IU] | Freq: Every day | SUBCUTANEOUS | Status: DC
  Administered 2020-12-13: 70 [IU] via SUBCUTANEOUS
  Filled 2020-12-12 (×2): qty 0.7

## 2020-12-12 MED ORDER — VITAMIN B-12 1000 MCG PO TABS
1000.0000 ug | ORAL_TABLET | Freq: Every day | ORAL | Status: DC
  Administered 2020-12-13 – 2020-12-15 (×3): 1000 ug via ORAL
  Filled 2020-12-12 (×3): qty 1

## 2020-12-12 MED ORDER — FUROSEMIDE 10 MG/ML IJ SOLN
80.0000 mg | Freq: Two times a day (BID) | INTRAMUSCULAR | Status: DC
  Administered 2020-12-13 – 2020-12-15 (×5): 80 mg via INTRAVENOUS
  Filled 2020-12-12 (×5): qty 8

## 2020-12-12 MED ORDER — ACETAMINOPHEN 650 MG RE SUPP: 650.0000 mg | Freq: Four times a day (QID) | RECTAL | Status: DC | PRN

## 2020-12-12 MED ORDER — LABETALOL HCL 5 MG/ML IV SOLN
10.0000 mg | Freq: Once | INTRAVENOUS | Status: AC
  Administered 2020-12-12: 10 mg via INTRAVENOUS
  Filled 2020-12-12: qty 4

## 2020-12-12 MED ORDER — LABETALOL HCL 5 MG/ML IV SOLN
20.0000 mg | Freq: Once | INTRAVENOUS | Status: AC
  Administered 2020-12-12: 20 mg via INTRAVENOUS
  Filled 2020-12-12: qty 4

## 2020-12-12 MED ORDER — SIMVASTATIN 10 MG PO TABS
20.0000 mg | ORAL_TABLET | Freq: Every day | ORAL | Status: DC
  Administered 2020-12-13 – 2020-12-14 (×3): 20 mg via ORAL
  Filled 2020-12-12: qty 2
  Filled 2020-12-12: qty 1
  Filled 2020-12-12: qty 2

## 2020-12-12 MED ORDER — METOPROLOL SUCCINATE ER 25 MG PO TB24
25.0000 mg | ORAL_TABLET | Freq: Every day | ORAL | Status: DC
  Administered 2020-12-13: 25 mg via ORAL
  Filled 2020-12-12: qty 1

## 2020-12-12 NOTE — ED Notes (Signed)
X-Ray at bedside.

## 2020-12-12 NOTE — ED Triage Notes (Signed)
Pt advised to come to ED by PCP for hypertension & fluid retention in legs. Pt has hx of CHF, states she has gained 40+ lbs in fluid and c/o sob w exertion. Pt has been out of a few of her bp meds for a few weeks, but takes lasix everyday.

## 2020-12-12 NOTE — H&P (Addendum)
History and Physical    Anajulia Leyendecker PIR:[id number] DOB: 05-11-1981 DOA: 12/12/2020  PCP: Tomasa Hose, NP  Patient coming from: Home.  Chief Complaint: Shortness of breath and increasing peripheral edema.  HPI: Sherri Dixon is a 40 y.o. female with history of hypertension, diabetes mellitus, diastolic dysfunction, history of PE has been noticing increasing lower extremity edema and exertional shortness of breath for the last 1 month despite taking her Lasix.  Patient also has not taken couple of her antihypertensives for the last 1 week as she ran out of it.  Had gone to her PCP today was found to have weight gain of 40 pounds over the last 1 month and advised to come to the ER.  ED Course: In the ER patient blood pressure was elevated with systolic more than 630 improved with IV labetalol.  Chest x-ray shows congestion.  Labs are significant for Covid test being negative.  BNP was 294 hemoglobin 8.3 high sensitive troponin was 34 and 35.  Potassium 3.4.  Creatinine 1.04.  Patient was given Lasix and admitted for CHF exacerbation.  Note that patient's albumin is 1.9.  Review of Systems: As per HPI, rest all negative.   Past Medical History:  Diagnosis Date  . Anemia   . Asthma   . CHF (congestive heart failure) (HCC)    Pt reports that father has CHF, not her  . Diabetes mellitus without complication ([place])   . Dyslipidemia   . GERD (gastroesophageal reflux disease)   . Pulmonary embolism [facility])     Past Surgical History:  Procedure Laterality Date  . MOUTH SURGERY    . ROBOTIC ASSITED PARTIAL NEPHRECTOMY Right 07/28/2019   Procedure: XI ROBOTIC ASSITED PARTIAL NEPHRECTOMY;  Surgeon: Cleon Gustin, MD;  Location: [facility]S;  Service: Urology;  Laterality: Right;  3 HRS     reports that she has never smoked. She has never used smokeless tobacco. She reports current alcohol use of about 1.0 standard drink of alcohol per week. She reports that she does not use  drugs.  Allergies  Allergen Reactions  . Amlodipine Swelling  . Humalog Kwikpen [Insulin Lispro] Other (See Comments)    75/50---swelling and ulcers in mouth     Family History  Problem Relation Age of Onset  . High blood pressure Mother   . Diabetes Mother   . Arthritis Mother   . Hyperlipidemia Mother   . High blood pressure Father   . Hyperlipidemia Father   . High blood pressure Sister   . Diabetes Brother   . Hyperlipidemia Brother   . High blood pressure Brother     Prior to Admission medications   Medication Sig Start Date End Date Taking? Authorizing Provider  cetirizine (ZYRTEC) 10 MG tablet Take 10 mg by mouth daily as needed for allergies.   Yes [provider]  cyanocobalamin 1000 MCG tablet Take 1,000 mcg by mouth daily. 07/25/20  Yes [provider]  furosemide (LASIX) 20 MG tablet Take 40 mg by mouth daily.   Yes [provider]  gabapentin (NEURONTIN) 300 MG capsule Take 300 mg by mouth daily as needed (pain).   Yes [provider]  hydrALAZINE (APRESOLINE) 25 MG tablet Take 25 mg by mouth 4 (four) times daily. 12/12/20  Yes [provider]  insulin aspart (NOVOLOG FLEXPEN) 100 UNIT/ML FlexPen Inject 12-20 Units as directed 3 (three) times daily.  01/03/19  Yes [provider]  Insulin Glargine (BASAGLAR KWIKPEN) 100 UNIT/ML SOPN Inject 70  Units into the skin at bedtime. 08/20/18  Yes [provider]  Multiple Vitamin (MULTIVITAMIN WITH MINERALS) TABS tablet Take 1 tablet by mouth daily. Centrum   Yes [provider]  nitroGLYCERIN (NITROSTAT) 0.4 MG SL tablet Place 0.4 mg under the tongue every 5 (five) minutes as needed for chest pain. 04/17/19  Yes [provider]  olmesartan (BENICAR) 40 MG tablet Take 40 mg by mouth daily.  03/20/18  Yes [provider]  potassium chloride SA (KLOR-CON) 20 MEQ tablet Take 20 mEq by mouth daily.   Yes [provider]  simvastatin  (ZOCOR) 20 MG tablet Take 20 mg by mouth at bedtime.  03/28/16  Yes [provider]  XARELTO 20 MG TABS tablet Take 20 mg by mouth daily. 10/15/20  Yes [provider]  Alcohol Swabs 70 % PADS 1 application by Does not apply route 3 (three) times daily before meals. 06/06/16   Dhungel, Nishant, MD  glucose blood test strip Use as instructed 06/06/16   Dhungel, Nishant, MD  HYDROcodone-acetaminophen (NORCO) 5-325 MG tablet Take 1-2 tablets by mouth every 6 (six) hours as needed for moderate pain. Patient not taking: No sig reported 07/28/19   Debbrah Alar, PA-C  Insulin Syringe-Needle U-100 (INSULIN SYRINGE 1CC/28G) 28G X 1/2" 1 ML MISC 1 application by Does not apply route 3 (three) times daily before meals. 06/06/16   Dhungel, Flonnie Overman, MD  metoprolol succinate (TOPROL-XL) 25 MG 24 hr tablet Take 25 mg by mouth daily.    [provider]    Physical Exam: Constitutional: Moderately built and nourished. Vitals:   12/12/20 2200 12/12/20 2215 12/12/20 2230 12/12/20 2303  BP: (!) 186/104 (!) 177/97 (!) 196/104 (!) 156/89  Pulse: 79 79 79 78  Resp: 20 18 18 16   Temp:      TempSrc:      SpO2: 100% 100% 100% 100%  Weight:      Height:       Eyes: Anicteric no pallor. ENMT: No discharge from the ears eyes nose or mouth. Neck: No mass felt.  No neck rigidity. Respiratory: No rhonchi or crepitations. Cardiovascular: S1-S2 heard. Abdomen: Soft nontender bowel sounds present. Musculoskeletal: Bilateral lower extremity edema present. Skin: No rash. Neurologic: Alert awake oriented to time place and person.  Moves all extremities. Psychiatric: Appears normal.  Normal affect.   Labs on Admission: I have personally reviewed following labs and imaging studies  CBC: Recent Labs  Lab 12/12/20 1823  WBC 11.6*  NEUTROABS 7.1  HGB 8.3*  HCT 27.9*  MCV 78.8*  PLT 562   Basic Metabolic Panel: Recent Labs  Lab 12/12/20 1823  NA 140  K 3.4*  CL 107  CO2 26  GLUCOSE  115*  BUN 10  CREATININE 1.04*  CALCIUM 8.5*   GFR: Estimated Creatinine Clearance: 108.1 mL/min (A) (by C-G formula based on SCr of 1.04 mg/dL (H)). Liver Function Tests: Recent Labs  Lab 12/12/20 1823  AST 28  ALT 20  ALKPHOS 96  BILITOT 0.4  PROT 5.8*  ALBUMIN 1.9*   No results for input(s): LIPASE, AMYLASE in the last 168 hours. No results for input(s): AMMONIA in the last 168 hours. Coagulation Profile: No results for input(s): INR, PROTIME in the last 168 hours. Cardiac Enzymes: No results for input(s): CKTOTAL, CKMB, CKMBINDEX, TROPONINI in the last 168 hours. BNP (last 3 results) No results for input(s): PROBNP in the last 8760 hours. HbA1C: No results for input(s): HGBA1C in the last 72 hours.  CBG: No results for input(s): GLUCAP in the last 168 hours. Lipid Profile: No results for input(s): CHOL, HDL, LDLCALC, TRIG, CHOLHDL, LDLDIRECT in the last 72 hours. Thyroid Function Tests: No results for input(s): TSH, T4TOTAL, FREET4, T3FREE, THYROIDAB in the last 72 hours. Anemia Panel: No results for input(s): VITAMINB12, FOLATE, FERRITIN, TIBC, IRON, RETICCTPCT in the last 72 hours. Urine analysis:    Component Value Date/Time   COLORURINE YELLOW 05/23/2018 1711   APPEARANCEUR CLEAR 05/23/2018 1711   LABSPEC >1.030 (H) 05/23/2018 1711   PHURINE 5.5 05/23/2018 1711   GLUCOSEU NEGATIVE 05/23/2018 1711   HGBUR SMALL (A) 05/23/2018 1711   BILIRUBINUR SMALL (A) 05/23/2018 1711   KETONESUR NEGATIVE 05/23/2018 1711   PROTEINUR 30 (A) 05/23/2018 1711   UROBILINOGEN 1.0 08/14/2009 0850   NITRITE NEGATIVE 05/23/2018 1711   LEUKOCYTESUR NEGATIVE 05/23/2018 1711   Sepsis Labs: @LABRCNTIP (IWLNLGXQJJHER:7,EYCXKGYJEHU:3) ) Recent Results (from the past 240 hour(s))  Resp Panel by RT-PCR (Flu A&B, Covid) Nasopharyngeal Swab     Status: None   Collection Time: 12/12/20  9:10 PM   Specimen: Nasopharyngeal Swab; Nasopharyngeal(NP) swabs in vial transport medium  Result  Value Ref Range Status   SARS Coronavirus 2 by RT PCR NEGATIVE NEGATIVE Final    Comment: (NOTE) SARS-CoV-2 target nucleic acids are NOT DETECTED.  The SARS-CoV-2 RNA is generally detectable in upper respiratory specimens during the acute phase of infection. The lowest concentration of SARS-CoV-2 viral copies this assay can detect is 138 copies/mL. A negative result does not preclude SARS-Cov-2 infection and should not be used as the sole basis for treatment or other patient management decisions. A negative result may occur with  improper specimen collection/handling, submission of specimen other than nasopharyngeal swab, presence of viral mutation(s) within the areas targeted by this assay, and inadequate number of viral copies(<138 copies/mL). A negative result must be combined with clinical observations, patient history, and epidemiological information. The expected result is Negative.  Fact Sheet for Patients:  EntrepreneurPulse.com.au  Fact Sheet for Healthcare Providers:  IncredibleEmployment.be  This test is no t yet approved or cleared by the [place] FDA and  has been authorized for detection and/or diagnosis of SARS-CoV-2 by FDA under an Emergency Use Authorization (EUA). This EUA will remain  in effect (meaning this test can be used) for the duration of the COVID-19 declaration under Section 564(b)(1) of the Act, 21 U.S.C.section 360bbb-3(b)(1), unless the authorization is terminated  or revoked sooner.       Influenza A by PCR NEGATIVE NEGATIVE Final   Influenza B by PCR NEGATIVE NEGATIVE Final    Comment: (NOTE) The Xpert Xpress SARS-CoV-2/FLU/RSV plus assay is intended as an aid in the diagnosis of influenza from Nasopharyngeal swab specimens and should not be used as a sole basis for treatment. Nasal washings and aspirates are unacceptable for Xpert Xpress SARS-CoV-2/FLU/RSV testing.  Fact Sheet for  Patients: EntrepreneurPulse.com.au  Fact Sheet for Healthcare Providers: IncredibleEmployment.be  This test is not yet approved or cleared by the [place] FDA and has been authorized for detection and/or diagnosis of SARS-CoV-2 by FDA under an Emergency Use Authorization (EUA). This EUA will remain in effect (meaning this test can be used) for the duration of the COVID-19 declaration under Section 564(b)(1) of the Act, 21 U.S.C. section 360bbb-3(b)(1), unless the authorization is terminated or revoked.  Performed at [facility], [place] [address] [place], [place] [address]      Radiological Exams on Admission: DG Chest Portable 1 View  Result Date: 12/12/2020 CLINICAL DATA:  Shortness of breath EXAM: PORTABLE CHEST 1 VIEW COMPARISON:  11/08/2019, CT 05/22/2018 FINDINGS: Interval cardiomegaly with vascular congestion. No consolidation, pleural effusion, or pneumothorax. IMPRESSION: Interval cardiomegaly with vascular congestion. Cardiomegaly could be secondary to multi chamber enlargement versus pericardial effusion. Electronically Signed   By: Donavan Foil M.D.   On: 12/12/2020 19:47      Assessment/Plan Principal Problem:   Acute on chronic congestive heart failure (HCC) Active Problems:   Hypokalemia   Diabetes mellitus type 2 in obese (HCC)   Hypertensive urgency    1. Acute CHF -given the chest x-ray findings of cardiomegaly with congestion and elevated BNP symptoms are consistent with CHF.  We will keep patient on Lasix 80 mg IV every 12 follow intake output metabolic panel check 2D echo.  Patient has recently followed with Dr. Terrence Dupont cardiologist.  May discuss with Dr. Terrence Dupont cardiologist in the morning.  Last 2D echo done in 2020 was showing EF of 60 to 65% with impaired relaxation. 2. Hypertensive urgency likely contributing to patient's symptoms.  Patient has missed couple of her antihypertensives over the last  1 week as patient ran out of.  Likely contributing to uncontrolled blood pressure.  Presently restarted on home medications and are on ARB beta-blockers hydralazine and as needed IV hydralazine also on Lasix.  Follow blood pressure trends. 3. Mild hypokalemia replace and recheck.  Check magnesium. 4. Significantly low albumin will need to check urine analysis for any significant proteinuria. 5. Diabetes mellitus type 2 on Lantus 70 units at bedtime with sliding scale coverage. 6. Chronic anemia follow CBC. 7. Chronic kidney disease stage II creatinine appears to be at baseline.  Check UA see #4 regarding to low albumin.  Note that patient is on ARB and presently on large dose of Lasix. 8. History of PE on Xarelto. 9. Hyperlipidemia on statins.  Since patient has significant volume overload with hypertensive urgency will need inpatient status.  EKG is pending.   DVT prophylaxis: Xarelto. Code Status: Full code. Family Communication: Family at the bedside. Disposition Plan: Home. Consults called: None. Admission status: Inpatient.   Rise Patience MD [facility] Pager [phone].  If 7PM-7AM, please contact night-coverage [email or web address] Password [facility]  12/12/2020, 11:46 PM   \

## 2020-12-12 NOTE — ED Provider Notes (Signed)
[facility] DEPT Provider Note   CSN: [id number] Arrival date & time: 12/12/20  1806     History Chief Complaint  Patient presents with  . Hypertension    Sherri Dixon is a 40 y.o. female.  HPI 40 year old female with a history of asthma, CHF with an EF of 54 to 65% as of June 2020, DM type II, GERD, PE resents to the ER with complaints of lower extremity edema and elevated blood pressures.  She states that she has been having ongoing worsening lower extremity edema, she does take Lasix but states that this does not seem to be working.  Has had significant shortness of breath on exertion.  She states that her legs are very swollen, and she thinks that the swelling has now spread up into her abdomen. She has gained 40 pounds over the last 4 months.  She also states that she has been out of her blood pressure medications over the last few days and has not taken them.  Seen at her PCPs office today and had a blood pressure of 210/118, sent to the ER for hypertensive crisis.  She denies any chest pain,  dizziness, headache.    Past Medical History:  Diagnosis Date  . Anemia   . Asthma   . CHF (congestive heart failure) (HCC)    Pt reports that father has CHF, not her  . Diabetes mellitus without complication ([place])   . Dyslipidemia   . GERD (gastroesophageal reflux disease)   . Pulmonary embolism [facility])     Patient Active Problem List   Diagnosis Date Noted  . Acute CHF (congestive heart failure) ([place]) 12/12/2020  . B12 deficiency anemia 03/11/2019  . Iron deficiency anemia due to chronic blood loss 03/10/2019  . Anemia 03/10/2019  . Pulmonary emboli ([place]) 05/22/2018  . Obesity, Class III, BMI 40-49.9 (morbid obesity) ([place]) 05/22/2018  . Eczema 05/22/2018  . Morbid obesity ([place]) 06/06/2016  . Uncontrolled diabetes mellitus with hyperglycemia, with long-term current use of insulin ([place]) 06/06/2016  . Hypokalemia 06/06/2016  . Diabetic ketoacidosis  without coma associated with type 1 diabetes mellitus ([place])   . DKA (diabetic ketoacidoses) 06/05/2016  . GERD (gastroesophageal reflux disease)     Past Surgical History:  Procedure Laterality Date  . MOUTH SURGERY    . ROBOTIC ASSITED PARTIAL NEPHRECTOMY Right 07/28/2019   Procedure: XI ROBOTIC ASSITED PARTIAL NEPHRECTOMY;  Surgeon: Cleon Gustin, MD;  Location: [facility] ORS;  Service: Urology;  Laterality: Right;  3 HRS     OB History   No obstetric history on file.     Family History  Problem Relation Age of Onset  . High blood pressure Mother   . Diabetes Mother   . Arthritis Mother   . Hyperlipidemia Mother   . High blood pressure Father   . Hyperlipidemia Father   . High blood pressure Sister   . Diabetes Brother   . Hyperlipidemia Brother   . High blood pressure Brother     Social History   Tobacco Use  . Smoking status: Never Smoker  . Smokeless tobacco: Never Used  Vaping Use  . Vaping Use: Never used  Substance Use Topics  . Alcohol use: Yes    Alcohol/week: 1.0 standard drink    Types: 1 Glasses of wine per week    Comment: occas  . Drug use: No    Home Medications Prior to Admission medications   Medication Sig Start Date End Date Taking? Authorizing Provider  cetirizine (ZYRTEC) 10 MG tablet Take 10 mg by mouth daily as needed for allergies.   Yes [provider]  cyanocobalamin 1000 MCG tablet Take 1,000 mcg by mouth daily. 07/25/20  Yes [provider]  furosemide (LASIX) 20 MG tablet Take 40 mg by mouth daily.   Yes [provider]  gabapentin (NEURONTIN) 300 MG capsule Take 300 mg by mouth daily as needed (pain).   Yes [provider]  hydrALAZINE (APRESOLINE) 25 MG tablet Take 25 mg by mouth 4 (four) times daily. 12/12/20  Yes [provider]  insulin aspart (NOVOLOG FLEXPEN) 100 UNIT/ML FlexPen Inject 12-20 Units as directed 3 (three) times daily.  01/03/19  Yes [provider]  Insulin  Glargine (BASAGLAR KWIKPEN) 100 UNIT/ML SOPN Inject 70 Units into the skin at bedtime. 08/20/18  Yes [provider]  Multiple Vitamin (MULTIVITAMIN WITH MINERALS) TABS tablet Take 1 tablet by mouth daily. Centrum   Yes [provider]  nitroGLYCERIN (NITROSTAT) 0.4 MG SL tablet Place 0.4 mg under the tongue every 5 (five) minutes as needed for chest pain. 04/17/19  Yes [provider]  olmesartan (BENICAR) 40 MG tablet Take 40 mg by mouth daily.  03/20/18  Yes [provider]  potassium chloride SA (KLOR-CON) 20 MEQ tablet Take 20 mEq by mouth daily.   Yes [provider]  simvastatin (ZOCOR) 20 MG tablet Take 20 mg by mouth at bedtime.  03/28/16  Yes [provider]  XARELTO 20 MG TABS tablet Take 20 mg by mouth daily. 10/15/20  Yes [provider]  Alcohol Swabs 70 % PADS 1 application by Does not apply route 3 (three) times daily before meals. 06/06/16   Dhungel, Nishant, MD  glucose blood test strip Use as instructed 06/06/16   Dhungel, Nishant, MD  HYDROcodone-acetaminophen (NORCO) 5-325 MG tablet Take 1-2 tablets by mouth every 6 (six) hours as needed for moderate pain. Patient not taking: No sig reported 07/28/19   Debbrah Alar, PA-C  Insulin Syringe-Needle U-100 (INSULIN SYRINGE 1CC/28G) 28G X 1/2" 1 ML MISC 1 application by Does not apply route 3 (three) times daily before meals. 06/06/16   Dhungel, Flonnie Overman, MD  metoprolol succinate (TOPROL-XL) 25 MG 24 hr tablet Take 25 mg by mouth daily.    [provider]    Allergies    Amlodipine and Humalog kwikpen [insulin lispro]  Review of Systems   Review of Systems  Constitutional: Negative for chills and fever.  HENT: Negative for ear pain and sore throat.   Eyes: Negative for pain and visual disturbance.  Respiratory: Positive for shortness of breath. Negative for cough.   Cardiovascular: Positive for leg swelling. Negative for chest pain and palpitations.   Gastrointestinal: Negative for abdominal pain and vomiting.  Genitourinary: Negative for dysuria and hematuria.  Musculoskeletal: Negative for arthralgias and back pain.  Skin: Negative for color change and rash.  Neurological: Negative for dizziness, seizures, syncope and headaches.  All other systems reviewed and are negative.   Physical Exam Updated Vital Signs BP (!) 196/104   Pulse 79   Temp 98.3 F (36.8 C) (Oral)   Resp 18   Ht 5\' 5"  (1.651 m)   Wt (!) 155.1 kg   LMP 11/18/2020   SpO2 100%   BMI 56.91 kg/m   Physical Exam Vitals and nursing note reviewed.  Constitutional:      General: She is not in acute distress.    Appearance: She is well-developed. She is obese. She  is not ill-appearing or diaphoretic.  HENT:     Head: Normocephalic and atraumatic.  Eyes:     Conjunctiva/sclera: Conjunctivae normal.  Cardiovascular:     Rate and Rhythm: Normal rate and regular rhythm.     Heart sounds: No murmur heard.   Pulmonary:     Effort: Pulmonary effort is normal. No respiratory distress.     Breath sounds: Normal breath sounds. No wheezing.     Comments: And in no respiratory distress, speaking in full sentences without increased work of breathing Abdominal:     Palpations: Abdomen is soft.     Tenderness: There is no abdominal tenderness.  Musculoskeletal:     Cervical back: Neck supple.     Right lower leg: Edema present.     Left lower leg: Edema present.     Comments: 4+ pitting edema to lower extremities bilaterally, edema extending up through the thighs and into the abdomen.  She looks very fluid overloaded.  Skin:    General: Skin is warm and dry.     Capillary Refill: Capillary refill takes less than 2 seconds.  Neurological:     General: No focal deficit present.     Mental Status: She is alert and oriented to person, place, and time.  Psychiatric:        Mood and Affect: Mood normal.        Behavior: Behavior normal.     ED Results /  Procedures / Treatments   Labs (all labs ordered are listed, but only abnormal results are displayed) Labs Reviewed  CBC WITH DIFFERENTIAL/PLATELET - Abnormal; Notable for the following components:      Result Value   WBC 11.6 (*)    RBC 3.54 (*)    Hemoglobin 8.3 (*)    HCT 27.9 (*)    MCV 78.8 (*)    MCH 23.4 (*)    MCHC 29.7 (*)    RDW 17.2 (*)    All other components within normal limits  COMPREHENSIVE METABOLIC PANEL - Abnormal; Notable for the following components:   Potassium 3.4 (*)    Glucose, Bld 115 (*)    Creatinine, Ser 1.04 (*)    Calcium 8.5 (*)    Total Protein 5.8 (*)    Albumin 1.9 (*)    All other components within normal limits  BRAIN NATRIURETIC PEPTIDE - Abnormal; Notable for the following components:   B Natriuretic Peptide 294.8 (*)    All other components within normal limits  TROPONIN I (HIGH SENSITIVITY) - Abnormal; Notable for the following components:   Troponin I (High Sensitivity) 34 (*)    All other components within normal limits  TROPONIN I (HIGH SENSITIVITY) - Abnormal; Notable for the following components:   Troponin I (High Sensitivity) 35 (*)    All other components within normal limits  RESP PANEL BY RT-PCR (FLU A&B, COVID) [id number]    EKG None    Radiology DG Chest Portable 1 View  Result Date: 12/12/2020 CLINICAL DATA:  Shortness of breath EXAM: PORTABLE CHEST 1 VIEW COMPARISON:  11/08/2019, CT 05/22/2018 FINDINGS: Interval cardiomegaly with vascular congestion. No consolidation, pleural effusion, or pneumothorax. IMPRESSION: Interval cardiomegaly with vascular congestion. Cardiomegaly could be secondary to multi chamber enlargement versus pericardial effusion. Electronically Signed   By: Donavan Foil M.D.   On: 12/12/2020 19:47    Procedures Procedures   Medications Ordered in ED Medications  furosemide (LASIX) injection 40 mg (40 mg Intravenous Not Given 12/12/20 2238)  potassium chloride  10 mEq in 100 mL IVPB (has no  administration in time range)  labetalol (NORMODYNE) injection 20 mg (20 mg Intravenous Given 12/12/20 1934)  furosemide (LASIX) injection 40 mg (40 mg Intravenous Given 12/12/20 2051)  labetalol (NORMODYNE) injection 10 mg (10 mg Intravenous Given 12/12/20 2237)    ED Course  I have reviewed the triage vital signs and the nursing notes.  Pertinent labs & imaging results that were available during my care of the patient were reviewed by me and considered in my medical decision making (see chart for details).    MDM Rules/Calculators/A&P                         40 year old female who presents to the ER with hypertensive crisis, blood pressures at 185/125, at one point blood pressure was 245/125 here in the ER.  Mildly tachycardic on arrival, however improved throughout the ED course.  No evidence of hypoxia.  On exam, she looks extremely fluid overloaded, she has 4+ pitting edema to her lower extremities, however she is in no acute respiratory distress, speaking full sentences without increased work of breathing.  No evidence of hypoxia.  Patient denying any signs of hypertensive emergency, no chest pain, dizziness, headache.  She does endorse ongoing shortness of breath with exertion but this has been going on for several weeks.  She states that she has not taken her blood pressure medications over the last few days, but is generally compliant with them.  Labs and imaging ordered, reviewed and interpreted by me -CBC with a mild leukocytosis of 11.6, hemoglobin of 8.3 which appears to be stable. -CMP with mild kalemia of 3.4, mildly elevated creatinine of 1.04, no evidence of AKI -Troponin elevated at 34 and 35 respectively. -BNP of 294.8  Chest x-ray with evidence of cardiomegaly, with vascular congestion and possible pericardial effusion.  EKG with normal sinus rhythm, no evidence of ischemic changes.  Patient received 20 mg of labetalol, blood pressure improved to 188/94, one-point she was  at 120/70, however then spiked back up again into the 301S systolic.  She was given an additional 10 mg of labetalol.  She was also given 40 mg of Lasix.  Patient will require admission for hypertensive emergency/further heart failure work-up.  She is agreeable to this.  Consulted hospitalist team for admission.  Consulted with Dr. Hal Hope who will admit the patient for further evaluation and treatment.  We will add on an additional 40 mg of Lasix and potassium per his request.  Stable for admission at this time.  Discussed the case with Dr. Sherry Ruffing who is agreeable to the above plan and disposition.  Final Clinical Impression(s) / ED Diagnoses Final diagnoses:  Hypertensive emergency  Acute on chronic congestive heart failure, unspecified heart failure type [facility])    Rx / DC Orders ED Discharge Orders    None       Lyndel Safe 12/12/20 2247    Tegeler, Gwenyth Allegra, MD 12/12/20 [phone]

## 2020-12-13 ENCOUNTER — Other Ambulatory Visit (HOSPITAL_COMMUNITY): Payer: 59

## 2020-12-13 DIAGNOSIS — E1169 Type 2 diabetes mellitus with other specified complication: Secondary | ICD-10-CM

## 2020-12-13 DIAGNOSIS — E669 Obesity, unspecified: Secondary | ICD-10-CM

## 2020-12-13 DIAGNOSIS — I5033 Acute on chronic diastolic (congestive) heart failure: Secondary | ICD-10-CM

## 2020-12-13 DIAGNOSIS — E876 Hypokalemia: Secondary | ICD-10-CM

## 2020-12-13 DIAGNOSIS — I160 Hypertensive urgency: Secondary | ICD-10-CM

## 2020-12-13 LAB — COMPREHENSIVE METABOLIC PANEL
ALT: 16 U/L (ref 0–44)
AST: 25 U/L (ref 15–41)
Albumin: 1.5 g/dL — ABNORMAL LOW (ref 3.5–5.0)
Alkaline Phosphatase: 81 U/L (ref 38–126)
Anion gap: 10 (ref 5–15)
BUN: 10 mg/dL (ref 6–20)
CO2: 23 mmol/L (ref 22–32)
Calcium: 8.1 mg/dL — ABNORMAL LOW (ref 8.9–10.3)
Chloride: 109 mmol/L (ref 98–111)
Creatinine, Ser: 1.08 mg/dL — ABNORMAL HIGH (ref 0.44–1.00)
GFR, Estimated: >60 mL/min (ref >60)
Glucose, Bld: 179 mg/dL — ABNORMAL HIGH (ref 70–99)
Potassium: 3.1 mmol/L — ABNORMAL LOW (ref 3.5–5.1)
Sodium: 142 mmol/L (ref 135–145)
Total Bilirubin: 0.4 mg/dL (ref 0.3–1.2)
Total Protein: 4.9 g/dL — ABNORMAL LOW (ref 6.5–8.1)

## 2020-12-13 LAB — URINALYSIS, ROUTINE W REFLEX MICROSCOPIC
Bilirubin Urine: NEGATIVE
Glucose, UA: 50 mg/dL — AB
Ketones, ur: NEGATIVE mg/dL
Leukocytes,Ua: NEGATIVE
Nitrite: NEGATIVE
Protein, ur: 100 mg/dL — AB
Specific Gravity, Urine: 1.006 (ref 1.005–1.030)
pH: 7 (ref 5.0–8.0)

## 2020-12-13 LAB — CBC WITH DIFFERENTIAL/PLATELET
Abs Immature Granulocytes: 0.04 10*3/uL (ref 0.00–0.07)
Basophils Absolute: 0 10*3/uL (ref 0.0–0.1)
Basophils Relative: 0 %
Eosinophils Absolute: 0.3 10*3/uL (ref 0.0–0.5)
Eosinophils Relative: 3 %
HCT: 23.8 % — ABNORMAL LOW (ref 36.0–46.0)
Hemoglobin: 7 g/dL — ABNORMAL LOW (ref 12.0–15.0)
Immature Granulocytes: 0 %
Lymphocytes Relative: 33 %
Lymphs Abs: 3.3 10*3/uL (ref 0.7–4.0)
MCH: 23.2 pg — ABNORMAL LOW (ref 26.0–34.0)
MCHC: 29.4 g/dL — ABNORMAL LOW (ref 30.0–36.0)
MCV: 78.8 fL — ABNORMAL LOW (ref 80.0–100.0)
Monocytes Absolute: 0.7 10*3/uL (ref 0.1–1.0)
Monocytes Relative: 7 %
Neutro Abs: 5.7 10*3/uL (ref 1.7–7.7)
Neutrophils Relative %: 57 %
Platelets: 283 10*3/uL (ref 150–400)
RBC: 3.02 MIL/uL — ABNORMAL LOW (ref 3.87–5.11)
RDW: 17.7 % — ABNORMAL HIGH (ref 11.5–15.5)
WBC: 10 10*3/uL (ref 4.0–10.5)
nRBC: 0 % (ref 0.0–0.2)

## 2020-12-13 LAB — CBG MONITORING, ED
Glucose-Capillary: 176 mg/dL — ABNORMAL HIGH (ref 70–99)
Glucose-Capillary: 208 mg/dL — ABNORMAL HIGH (ref 70–99)

## 2020-12-13 LAB — GLUCOSE, CAPILLARY: Glucose-Capillary: 192 mg/dL — ABNORMAL HIGH (ref 70–99)

## 2020-12-13 LAB — TSH: TSH: 5.013 u[IU]/mL — ABNORMAL HIGH (ref 0.350–4.500)

## 2020-12-13 LAB — HIV ANTIBODY (ROUTINE TESTING W REFLEX): HIV Screen 4th Generation wRfx: NONREACTIVE

## 2020-12-13 MED ORDER — CALCIUM CARBONATE ANTACID 500 MG PO CHEW
1.0000 | CHEWABLE_TABLET | Freq: Once | ORAL | Status: AC
  Administered 2020-12-13: 200 mg via ORAL
  Filled 2020-12-13: qty 1

## 2020-12-13 MED ORDER — POTASSIUM CHLORIDE CRYS ER 20 MEQ PO TBCR
40.0000 meq | EXTENDED_RELEASE_TABLET | Freq: Two times a day (BID) | ORAL | Status: AC
  Administered 2020-12-13 (×2): 40 meq via ORAL
  Filled 2020-12-13 (×2): qty 2

## 2020-12-13 NOTE — Progress Notes (Signed)
[facility] PROGRESS NOTE   Sherri Dixon WGN:[id number] DOB: 11-28-1980 DOA: 12/12/2020  PCP: Tomasa Hose, NP  Brief History/Interval Summary: 40 y.o. female with history of essential hypertension, diabetes mellitus, diastolic dysfunction, history of PE has been noticing increasing lower extremity edema and exertional shortness of breath for the last 1 month despite taking her Lasix.  Patient also has not taken couple of her antihypertensives for the last 1 week as she ran out of it.  Had gone to her PCP today was found to have weight gain of 40 pounds over the last 1 month and advised to come to the ER.  She was hospitalized for further management of her CHF exacerbation.  Reason for Visit: Acute diastolic CHF  Consultants: None yet  Procedures: Echocardiogram is pending  Antibiotics: Anti-infectives (From admission, onward)   None      Subjective/Interval History: Patient mentions that she is starting to feel slightly better with respect to her difficulty breathing.  Still has a lot of swelling in her legs.  Denies any chest pain.  No fever or chills.  No significant cough.  Denies any nausea or vomiting.    Assessment/Plan:  Acute CHF most likely diastolic Based on echocardiogram from 2020 patient had normal systolic function.  Diastolic dysfunction was noted.  Echocardiogram has been ordered to see if there has been any changes in the last 2 years.  Patient started on IV furosemide.  Per her report she has been urinating a lot.  Not charted yet.  Strict ins and outs and daily weights.  EKG does not show any ischemic ST segment changes.  Nonspecific T wave changes noted.  Patient reports that she has been seen by Dr. Terrence Dupont previously and underwent stress testing within the last 2 years which was unremarkable.  TSH noted to be 5.0.  No clear symptoms of hypothyroidism identified.  We will check a free T4 level tomorrow.  Hypertensive urgency Patient had  significantly elevated blood pressures at presentation likely because she ran out of some of her antihypertensives.  She was started back on hydralazine, Toprol-XL and Avapro.  Blood pressures have improved.  Continue to monitor.  Hypokalemia This will be repleted.  Check magnesium level  Diabetes mellitus type 2 in obese Patient takes Basaglar insulin at home.  Continue Lantus insulin here in the hospital and SSI.  Send HbA1c in our system.  We will order.  Chronic kidney disease stage II Renal function at baseline.  Monitor urine output.  Hypoalbuminemia Reason for this is not entirely clear.  Patient denies any history of liver disease.  LFTs are normal.  UA does show proteinuria but does not appear to be significant.  Microcytic anemia Hemoglobin noted to be quite low.  Baseline hemoglobin appears to be around 8.  No recent anemia panel and this will be ordered.  No evidence of overt bleeding.  It is noted that the patient is on anticoagulants.  Transfuse if it drops below 7.  Patient is noted to be on B12 supplements.  History of pulmonary embolism Patient is on rivaroxaban.  Hyperlipidemia Continue statin  Morbid obesity Estimated body mass index is 56.91 kg/m as calculated from the following:   Height as of this encounter: 5\' 5"  (1.651 m).   Weight as of this encounter: 155.1 kg.   DVT Prophylaxis: On Xarelto Code Status: Full code Family Communication: Discussed with patient and her husband Disposition Plan: Hopefully return home when improved.  Mobilize.  Status is: Inpatient  Remains inpatient appropriate because:IV treatments appropriate due to intensity of illness or inability to take PO and Inpatient level of care appropriate due to severity of illness   Dispo: The patient is from: Home              Anticipated d/c is to: Home              Patient currently is not medically stable to d/c.   Difficult to place patient No      Medications:  Scheduled: .  furosemide  80 mg Intravenous Q12H  . hydrALAZINE  25 mg Oral QID  . insulin aspart  0-9 Units Subcutaneous TID WC  . insulin glargine  70 Units Subcutaneous QHS  . irbesartan  300 mg Oral Daily  . metoprolol succinate  25 mg Oral Daily  . potassium chloride SA  40 mEq Oral BID  . rivaroxaban  20 mg Oral QPC breakfast  . simvastatin  20 mg Oral QHS  . cyanocobalamin  1,000 mcg Oral Daily   Continuous:  TOI:[id number] **OR** acetaminophen   Objective:  Vital Signs  Vitals:   12/13/20 0500 12/13/20 0600 12/13/20 0700 12/13/20 0800  BP: (!) 123/99 (!) 162/86 (!) 146/123 (!) 184/85  Pulse: 88 91 84 87  Resp: (!) 26 (!) 22 18 19   Temp:      TempSrc:      SpO2: 100% 99% 100% 100%  Weight:      Height:        Intake/Output Summary (Last 24 hours) at 12/13/2020 0853 Last data filed at 12/13/2020 0022 Gross per 24 hour  Intake 100 ml  Output -  Net 100 ml   Filed Weights   12/12/20 1818  Weight: (!) 155.1 kg    General appearance: Awake alert.  In no distress.  Obese Resp: Normal effort at rest.  Diminished air entry at the bases with few crackles bilaterally.  No wheezing or rhonchi. Cardio: S1-S2 is normal regular.  No S3-S4.  No rubs murmurs or bruit GI: Abdomen is soft.  Nontender nondistended.  Bowel sounds are present normal.  No masses organomegaly Extremities: Edema involving bilateral lower extremities. Neurologic: Alert and oriented x3.  No focal neurological deficits.    Lab Results:  Data Reviewed: I have personally reviewed following labs and imaging studies  CBC: Recent Labs  Lab 12/12/20 1823 12/13/20 0530  WBC 11.6* 10.0  NEUTROABS 7.1 5.7  HGB 8.3* 7.0*  HCT 27.9* 23.8*  MCV 78.8* 78.8*  PLT 336 250    Basic Metabolic Panel: Recent Labs  Lab 12/12/20 1823 12/13/20 0530  NA 140 142  K 3.4* 3.1*  CL 107 109  CO2 26 23  GLUCOSE 115* 179*  BUN 10 10  CREATININE 1.04* 1.08*  CALCIUM 8.5* 8.1*    GFR: Estimated Creatinine  Clearance: 104.1 mL/min (A) (by C-G formula based on SCr of 1.08 mg/dL (H)).  Liver Function Tests: Recent Labs  Lab 12/12/20 1823 12/13/20 0530  AST 28 25  ALT 20 16  ALKPHOS 96 81  BILITOT 0.4 0.4  PROT 5.8* 4.9*  ALBUMIN 1.9* 1.5*     CBG: Recent Labs  Lab 12/13/20 0810  GLUCAP 176*     Thyroid Function Tests: Recent Labs    12/13/20 0027  TSH 5.013*    Anemia Panel: No results for input(s): VITAMINB12, FOLATE, FERRITIN, TIBC, IRON, RETICCTPCT in the last 72 hours.  Recent Results (from the past 240 hour(s))  Resp Panel by RT-PCR (Flu  A&B, Covid) Nasopharyngeal Swab     Status: None   Collection Time: 12/12/20  9:10 PM   Specimen: Nasopharyngeal Swab; Nasopharyngeal(NP) swabs in vial transport medium  Result Value Ref Range Status   SARS Coronavirus 2 by RT PCR NEGATIVE NEGATIVE Final    Comment: (NOTE) SARS-CoV-2 target nucleic acids are NOT DETECTED.  The SARS-CoV-2 RNA is generally detectable in upper respiratory specimens during the acute phase of infection. The lowest concentration of SARS-CoV-2 viral copies this assay can detect is 138 copies/mL. A negative result does not preclude SARS-Cov-2 infection and should not be used as the sole basis for treatment or other patient management decisions. A negative result may occur with  improper specimen collection/handling, submission of specimen other than nasopharyngeal swab, presence of viral mutation(s) within the areas targeted by this assay, and inadequate number of viral copies(<138 copies/mL). A negative result must be combined with clinical observations, patient history, and epidemiological information. The expected result is Negative.  Fact Sheet for Patients:  EntrepreneurPulse.com.au  Fact Sheet for Healthcare Providers:  IncredibleEmployment.be  This test is no t yet approved or cleared by the [place] FDA and  has been authorized for detection and/or  diagnosis of SARS-CoV-2 by FDA under an Emergency Use Authorization (EUA). This EUA will remain  in effect (meaning this test can be used) for the duration of the COVID-19 declaration under Section 564(b)(1) of the Act, 21 U.S.C.section 360bbb-3(b)(1), unless the authorization is terminated  or revoked sooner.       Influenza A by PCR NEGATIVE NEGATIVE Final   Influenza B by PCR NEGATIVE NEGATIVE Final    Comment: (NOTE) The Xpert Xpress SARS-CoV-2/FLU/RSV plus assay is intended as an aid in the diagnosis of influenza from Nasopharyngeal swab specimens and should not be used as a sole basis for treatment. Nasal washings and aspirates are unacceptable for Xpert Xpress SARS-CoV-2/FLU/RSV testing.  Fact Sheet for Patients: EntrepreneurPulse.com.au  Fact Sheet for Healthcare Providers: IncredibleEmployment.be  This test is not yet approved or cleared by the [place] FDA and has been authorized for detection and/or diagnosis of SARS-CoV-2 by FDA under an Emergency Use Authorization (EUA). This EUA will remain in effect (meaning this test can be used) for the duration of the COVID-19 declaration under Section 564(b)(1) of the Act, 21 U.S.C. section 360bbb-3(b)(1), unless the authorization is terminated or revoked.  Performed at [facility], [place] [address] [place], [place] [address]       Radiology Studies: DG Chest Portable 1 View  Result Date: 12/12/2020 CLINICAL DATA:  Shortness of breath EXAM: PORTABLE CHEST 1 VIEW COMPARISON:  11/08/2019, CT 05/22/2018 FINDINGS: Interval cardiomegaly with vascular congestion. No consolidation, pleural effusion, or pneumothorax. IMPRESSION: Interval cardiomegaly with vascular congestion. Cardiomegaly could be secondary to multi chamber enlargement versus pericardial effusion. Electronically Signed   By: Donavan Foil M.D.   On: 12/12/2020 19:47       LOS: 1 day   [facility] Pager on [email or web address]  12/13/2020, 8:53 AM

## 2020-12-13 NOTE — Plan of Care (Signed)

## 2020-12-13 NOTE — Discharge Instructions (Signed)
Heart Failure, Diagnosis  Heart failure is a condition in which the heart has trouble pumping blood. This may mean that the heart cannot pump enough blood out to the body or that the heart does not fill up with enough blood. For some people with heart failure, fluid may back up into the lungs. There may also be swelling (edema) in the lower legs. Heart failure is usually a long-term (chronic) condition. It is important for you to take good care of yourself and follow the treatment plan from your health care provider. What are the causes? This condition may be caused by:  High blood pressure (hypertension). Hypertension causes the heart muscle to work harder than normal.  Coronary artery disease, or CAD. CAD is the buildup of cholesterol and fat (plaque) in the arteries of the heart.  Heart attack, also called myocardial infarction. This injures the heart muscle, making it hard for the heart to pump blood.  Abnormal heart valves. The valves do not open and close properly, forcing the heart to pump harder to keep the blood flowing.  Heart muscle disease, inflammation, or infection (cardiomyopathy or myocarditis). This is damage to the heart muscle. It can increase the risk of heart failure.  Lung disease. The heart works harder when the lungs are not healthy. What increases the risk? The risk of heart failure increases as a person ages. This condition is also more likely to develop in people who:  Are obese.  Are female.  Use tobacco or nicotine products.  Abuse alcohol or drugs.  Have taken medicines that can damage the heart, such as chemotherapy drugs.  Have any of these conditions: ? Diabetes. ? Abnormal heart rhythms. ? Thyroid problems. ? Low blood counts (anemia). ? Chronic kidney disease.  Have a family history of heart failure. What are the signs or symptoms? Symptoms of this condition include:  Shortness of breath with activity, such as when climbing stairs.  A cough  that does not go away.  Swelling of the feet, ankles, legs, or abdomen.  Losing or gaining weight for no reason.  Trouble breathing when lying flat.  Waking from sleep because of the need to sit up and get more air.  Rapid heartbeat.  Tiredness (fatigue) and loss of energy.  Feeling light-headed, dizzy, or close to fainting.  Nausea or loss of appetite.  Waking up more often during the night to urinate (nocturia).  Confusion. How is this diagnosed? This condition is diagnosed based on:  Your medical history, symptoms, and a physical exam.  Diagnostic tests, which may include: ? Echocardiogram. ? Electrocardiogram (ECG). ? Chest X-ray. ? Blood tests. ? Exercise stress test. ? Cardiac MRI. ? Cardiac catheterization and angiogram. ? Radionuclide scans. How is this treated? Treatment for this condition is aimed at managing the symptoms of heart failure. Medicines Treatment may include medicines that:  Help lower blood pressure by relaxing (dilating) the blood vessels. These medicines are called ACE inhibitors (angiotensin-converting enzyme), ARBs (angiotensin receptor blockers), or vasodilators.  Cause the kidneys to remove salt and water from the blood through urination (diuretics).  Improve heart muscle strength and prevent the heart from beating too fast (beta blockers).  Increase the force of the heartbeat (digoxin).  Lower heart rates. Certain diabetes medicines (SGLT-2 inhibitors) may also be used in treatment. Healthy behavior changes Treatment may also include making healthy lifestyle changes, such as:  Reaching and staying at a healthy weight.  Not using tobacco or nicotine products.  Eating heart-healthy foods.    Limiting or avoiding alcohol.  Stopping the use of illegal drugs.  Being physically active.  Participating in a cardiac rehabilitation program, which is a treatment program to improve your health and well-being through exercise training,  education, and counseling. Other treatments Other treatments may include:  Procedures to open blocked arteries or repair damaged valves.  Placing a pacemaker to improve heart function (cardiac resynchronization therapy).  Placing a device to treat serious abnormal heart rhythms (implantable cardioverter defibrillator, or ICD).  Placing a device to improve the pumping ability of the heart (left ventricular assist device, or LVAD).  Receiving a healthy heart from a donor (heart transplant). This is done when other treatments have not helped. Follow these instructions at home:  Manage other health conditions as told by your health care provider. These may include hypertension, diabetes, thyroid disease, or abnormal heart rhythms.  Get ongoing education and support as needed. Learn as much as you can about heart failure.  Keep all follow-up visits. This is important. Summary  Heart failure is a condition in which the heart has trouble pumping blood.  This condition is commonly caused by high blood pressure and other diseases of the heart and lungs.  Symptoms of this condition include shortness of breath, tiredness (fatigue), nausea, and swelling of the feet, ankles, legs, or abdomen.  Treatments for this condition may include medicines, lifestyle changes, and surgery.  Manage other health conditions as told by your health care provider. This information is not intended to replace advice given to you by your health care provider. Make sure you discuss any questions you have with your health care provider. Document Revised: 04/07/2020 Document Reviewed: 04/07/2020 Elsevier Patient Education  2021 [employer]  

## 2020-12-13 NOTE — ED Notes (Signed)
Pt ambulatory to bathroom

## 2020-12-14 ENCOUNTER — Inpatient Hospital Stay (HOSPITAL_COMMUNITY): Payer: 59

## 2020-12-14 DIAGNOSIS — D509 Iron deficiency anemia, unspecified: Secondary | ICD-10-CM

## 2020-12-14 DIAGNOSIS — I5023 Acute on chronic systolic (congestive) heart failure: Secondary | ICD-10-CM

## 2020-12-14 LAB — CBC
HCT: 24.1 % — ABNORMAL LOW (ref 36.0–46.0)
Hemoglobin: 7.1 g/dL — ABNORMAL LOW (ref 12.0–15.0)
MCH: 23.4 pg — ABNORMAL LOW (ref 26.0–34.0)
MCHC: 29.5 g/dL — ABNORMAL LOW (ref 30.0–36.0)
MCV: 79.3 fL — ABNORMAL LOW (ref 80.0–100.0)
Platelets: 288 10*3/uL (ref 150–400)
RBC: 3.04 MIL/uL — ABNORMAL LOW (ref 3.87–5.11)
RDW: 18 % — ABNORMAL HIGH (ref 11.5–15.5)
WBC: 8.9 10*3/uL (ref 4.0–10.5)
nRBC: 0 % (ref 0.0–0.2)

## 2020-12-14 LAB — IRON AND TIBC
Iron: 15 ug/dL — ABNORMAL LOW (ref 28–170)
Saturation Ratios: 8 % — ABNORMAL LOW (ref 10.4–31.8)
TIBC: 185 ug/dL — ABNORMAL LOW (ref 250–450)
UIBC: 170 ug/dL

## 2020-12-14 LAB — FOLATE: Folate: 15.8 ng/mL (ref >5.9)

## 2020-12-14 LAB — BASIC METABOLIC PANEL
Anion gap: 7 (ref 5–15)
BUN: 10 mg/dL (ref 6–20)
CO2: 25 mmol/L (ref 22–32)
Calcium: 8.1 mg/dL — ABNORMAL LOW (ref 8.9–10.3)
Chloride: 110 mmol/L (ref 98–111)
Creatinine, Ser: 1 mg/dL (ref 0.44–1.00)
GFR, Estimated: >60 mL/min (ref >60)
Glucose, Bld: 83 mg/dL (ref 70–99)
Potassium: 3.3 mmol/L — ABNORMAL LOW (ref 3.5–5.1)
Sodium: 142 mmol/L (ref 135–145)

## 2020-12-14 LAB — GLUCOSE, CAPILLARY
Glucose-Capillary: 201 mg/dL — ABNORMAL HIGH (ref 70–99)
Glucose-Capillary: 73 mg/dL (ref 70–99)
Glucose-Capillary: 84 mg/dL (ref 70–99)
Glucose-Capillary: 104 mg/dL — ABNORMAL HIGH (ref 70–99)
Glucose-Capillary: 102 mg/dL — ABNORMAL HIGH (ref 70–99)

## 2020-12-14 LAB — VITAMIN B12: Vitamin B-12: 385 pg/mL (ref 180–914)

## 2020-12-14 LAB — ECHOCARDIOGRAM COMPLETE
Area-P 1/2: 2.8 cm2
Height: 65 in
S' Lateral: 3 cm
Weight: 5361.59 oz

## 2020-12-14 LAB — FERRITIN: Ferritin: 7 ng/mL — ABNORMAL LOW (ref 11–307)

## 2020-12-14 LAB — MAGNESIUM: Magnesium: 1.6 mg/dL — ABNORMAL LOW (ref 1.7–2.4)

## 2020-12-14 LAB — RETICULOCYTES
Immature Retic Fract: 21 % — ABNORMAL HIGH (ref 2.3–15.9)
RBC.: 3.05 MIL/uL — ABNORMAL LOW (ref 3.87–5.11)
Retic Count, Absolute: 48.2 10*3/uL (ref 19.0–186.0)
Retic Ct Pct: 1.6 % (ref 0.4–3.1)

## 2020-12-14 LAB — T4, FREE: Free T4: 0.86 ng/dL (ref 0.61–1.12)

## 2020-12-14 MED ORDER — MAGNESIUM SULFATE 2 GM/50ML IV SOLN
2.0000 g | Freq: Once | INTRAVENOUS | Status: AC
  Administered 2020-12-14: 2 g via INTRAVENOUS
  Filled 2020-12-14: qty 50

## 2020-12-14 MED ORDER — POTASSIUM CHLORIDE CRYS ER 20 MEQ PO TBCR
40.0000 meq | EXTENDED_RELEASE_TABLET | Freq: Two times a day (BID) | ORAL | Status: AC
  Administered 2020-12-14 (×2): 40 meq via ORAL
  Filled 2020-12-14 (×2): qty 2

## 2020-12-14 MED ORDER — METOPROLOL SUCCINATE ER 50 MG PO TB24
50.0000 mg | ORAL_TABLET | Freq: Every day | ORAL | Status: DC
  Administered 2020-12-14 – 2020-12-15 (×2): 50 mg via ORAL
  Filled 2020-12-14 (×2): qty 1

## 2020-12-14 MED ORDER — ALUM & MAG HYDROXIDE-SIMETH 200-200-20 MG/5ML PO SUSP
5.0000 mL | Freq: Four times a day (QID) | ORAL | Status: DC | PRN
  Administered 2020-12-14: 5 mL via ORAL
  Filled 2020-12-14: qty 30

## 2020-12-14 MED ORDER — SODIUM CHLORIDE 0.9 % IV SOLN
510.0000 mg | Freq: Once | INTRAVENOUS | Status: AC
  Administered 2020-12-14: 510 mg via INTRAVENOUS
  Filled 2020-12-14: qty 510

## 2020-12-14 MED ORDER — HYDRALAZINE HCL 50 MG PO TABS
50.0000 mg | ORAL_TABLET | Freq: Three times a day (TID) | ORAL | Status: DC
  Administered 2020-12-14 – 2020-12-15 (×3): 50 mg via ORAL
  Filled 2020-12-14 (×3): qty 1

## 2020-12-14 MED ORDER — POTASSIUM CHLORIDE CRYS ER 20 MEQ PO TBCR
40.0000 meq | EXTENDED_RELEASE_TABLET | Freq: Once | ORAL | Status: AC
  Administered 2020-12-14: 40 meq via ORAL
  Filled 2020-12-14: qty 2

## 2020-12-14 MED ORDER — PERFLUTREN LIPID MICROSPHERE
1.0000 mL | INTRAVENOUS | Status: AC | PRN
  Administered 2020-12-14: 1 mL via INTRAVENOUS

## 2020-12-14 NOTE — Progress Notes (Signed)
[facility] PROGRESS NOTE   Nashay Brickley HUT:[id number] DOB: [birth year] DOA: 12/12/2020  PCP: Tomasa Hose, NP  Brief History/Interval Summary: 40 y.o. female with history of essential hypertension, diabetes mellitus, diastolic dysfunction, history of PE has been noticing increasing lower extremity edema and exertional shortness of breath for the last 1 month despite taking her Lasix.  Patient also has not taken couple of her antihypertensives for the last 1 week as she ran out of it.  Had gone to her PCP today was found to have weight gain of 40 pounds over the last 1 month and advised to come to the ER.  She was hospitalized for further management of her CHF exacerbation.  Reason for Visit: Acute diastolic CHF  Consultants: None yet  Procedures: Echocardiogram is pending  Antibiotics: Anti-infectives (From admission, onward)   None      Subjective/Interval History: Patient mentions to her shortness of breath and swelling in the legs appear to be improving.  Denies any chest pain fever chills or cough.  No nausea vomiting.  She does mention that she has a longstanding history of iron deficiency and used to receive iron infusions in the past.    Assessment/Plan:  Acute CHF most likely diastolic Based on echocardiogram from 2020 patient had normal systolic function.  Diastolic dysfunction was noted.  Echocardiogram has been ordered to see if there has been any changes in the last 2 years.   Patient started on intravenous furosemide.  Has good urine output.  Continue to monitor ins and outs and daily weights.  Continue IV diuretics for now.   EKG does not show any ischemic ST segment changes.  Nonspecific T wave changes noted.  Patient reports that she has been seen by Dr. Terrence Dupont previously and underwent stress testing within the last 2 years which was unremarkable.  TSH noted to be 5.0.  No clear symptoms of hypothyroidism identified.  Free T4 level is  pending.  Hypertensive urgency Patient had significantly elevated blood pressures at presentation likely because she ran out of some of her antihypertensives.  Blood pressure remains poorly controlled despite resumption of her antihypertensives.  Will increase the dose of her hydralazine and Toprol today.  Continue Avapro.  May need to further titrate her dosage.    Hypokalemia and hypomagnesemia Replete her potassium and magnesium.  Diabetes mellitus type 2 in obese Patient takes Basaglar insulin at home.  Continue Lantus insulin here in the hospital and SSI.  HbA1c will be ordered.  Monitor glucose levels closely.  Noted to have low CBG this morning though she is asymptomatic.    Chronic kidney disease stage II Renal function at baseline.  Monitor urine output.  Monitor closely while she is getting IV diuretics.  Hypoalbuminemia Reason for this is not entirely clear.  Patient denies any history of liver disease.  LFTs are normal.  UA does show proteinuria but does not appear to be significant.  Outpatient work-up.  Microcytic anemia/iron deficiency She has a longstanding history of iron deficiency and used to receive iron infusions in the past although she has not received any in quite a while.  Ferritin noted to be 7.  S56 folic acid level normal.  We will give her iron infusion here in the hospital.  Patient is noted to be on B12 supplements.  Transfuse if hemoglobin drops below 7.    History of pulmonary embolism Patient is on rivaroxaban.  Hyperlipidemia Continue statin  Morbid obesity Estimated body mass index is 55.76  kg/m as calculated from the following:   Height as of this encounter: 5\' 5"  (1.651 m).   Weight as of this encounter: 152 kg.   DVT Prophylaxis: On Xarelto Code Status: Full code Family Communication: Discussed with patient and her husband Disposition Plan: Hopefully return home when improved.  Mobilize.  Status is: Inpatient  Remains inpatient  appropriate because:IV treatments appropriate due to intensity of illness or inability to take PO and Inpatient level of care appropriate due to severity of illness   Dispo: The patient is from: Home              Anticipated d/c is to: Home              Patient currently is not medically stable to d/c.   Difficult to place patient No      Medications:  Scheduled: . furosemide  80 mg Intravenous Q12H  . hydrALAZINE  50 mg Oral Q8H  . insulin aspart  0-9 Units Subcutaneous TID WC  . insulin glargine  70 Units Subcutaneous QHS  . irbesartan  300 mg Oral Daily  . metoprolol succinate  50 mg Oral Daily  . potassium chloride SA  40 mEq Oral BID  . potassium chloride  40 mEq Oral Once  . rivaroxaban  20 mg Oral QPC breakfast  . simvastatin  20 mg Oral QHS  . cyanocobalamin  1,000 mcg Oral Daily   Continuous: . ferumoxytol     DHR:CBULAGTXMIWOE **OR** acetaminophen   Objective:  Vital Signs  Vitals:   12/13/20 1651 12/13/20 2120 12/14/20 0602 12/14/20 0646  BP: (!) 188/88 (!) 191/96 (!) 184/92   Pulse: 87 85 86   Resp: 20 18 18    Temp: 98.4 F (36.9 C) 98 F (36.7 C) 98.8 F (37.1 C)   TempSrc: Oral Oral    SpO2: 100% 100% 99%   Weight:    (!) 152 kg  Height:        Intake/Output Summary (Last 24 hours) at 12/14/2020 1019 Last data filed at 12/14/2020 0646 Gross per 24 hour  Intake --  Output 2600 ml  Net -2600 ml   Filed Weights   12/12/20 1818 12/14/20 0646  Weight: (!) 155.1 kg (!) 152 kg    General appearance: Awake alert.  In no distress Resp: Normal effort at rest.  Diminished air entry at the bases with few crackles.  No wheezing or rhonchi. Cardio: S1-S2 is normal regular.  No S3-S4.  No rubs murmurs or bruit GI: Abdomen is soft.  Nontender nondistended.  Bowel sounds are present normal.  No masses organomegaly Extremities: Edema bilateral lower extremities.  Slightly better compared to yesterday. Neurologic: Alert and oriented x3.  No focal  neurological deficits.    Lab Results:  Data Reviewed: I have personally reviewed following labs and imaging studies  CBC: Recent Labs  Lab 12/12/20 1823 12/13/20 0530 12/14/20 0514  WBC 11.6* 10.0 8.9  NEUTROABS 7.1 5.7  --   HGB 8.3* 7.0* 7.1*  HCT 27.9* 23.8* 24.1*  MCV 78.8* 78.8* 79.3*  PLT 336 283 321    Basic Metabolic Panel: Recent Labs  Lab 12/12/20 1823 12/13/20 0530 12/14/20 0514  NA 140 142 142  K 3.4* 3.1* 3.3*  CL 107 109 110  CO2 26 23 25   GLUCOSE 115* 179* 83  BUN 10 10 10   CREATININE 1.04* 1.08* 1.00  CALCIUM 8.5* 8.1* 8.1*  MG  --   --  1.6*    GFR: Estimated  Creatinine Clearance: 111 mL/min (by C-G formula based on SCr of 1 mg/dL).  Liver Function Tests: Recent Labs  Lab 12/12/20 1823 12/13/20 0530  AST 28 25  ALT 20 16  ALKPHOS 96 81  BILITOT 0.4 0.4  PROT 5.8* 4.9*  ALBUMIN 1.9* 1.5*     CBG: Recent Labs  Lab 12/13/20 0810 12/13/20 1228 12/13/20 2114 12/14/20 0721  GLUCAP 176* 208* 192* 73     Thyroid Function Tests: Recent Labs    12/13/20 0027  TSH 5.013*    Anemia Panel: Recent Labs    12/14/20 0514  VITAMINB12 385  FOLATE 15.8  FERRITIN 7*  TIBC 185*  IRON 15*  RETICCTPCT 1.6    Recent Results (from the past 240 hour(s))  Resp Panel by RT-PCR (Flu A&B, Covid) Nasopharyngeal Swab     Status: None   Collection Time: 12/12/20  9:10 PM   Specimen: Nasopharyngeal Swab; Nasopharyngeal(NP) swabs in vial transport medium  Result Value Ref Range Status   SARS Coronavirus 2 by RT PCR NEGATIVE NEGATIVE Final    Comment: (NOTE) SARS-CoV-2 target nucleic acids are NOT DETECTED.  The SARS-CoV-2 RNA is generally detectable in upper respiratory specimens during the acute phase of infection. The lowest concentration of SARS-CoV-2 viral copies this assay can detect is 138 copies/mL. A negative result does not preclude SARS-Cov-2 infection and should not be used as the sole basis for treatment or other patient  management decisions. A negative result may occur with  improper specimen collection/handling, submission of specimen other than nasopharyngeal swab, presence of viral mutation(s) within the areas targeted by this assay, and inadequate number of viral copies(<138 copies/mL). A negative result must be combined with clinical observations, patient history, and epidemiological information. The expected result is Negative.  Fact Sheet for Patients:  EntrepreneurPulse.com.au  Fact Sheet for Healthcare Providers:  IncredibleEmployment.be  This test is no t yet approved or cleared by the [place] FDA and  has been authorized for detection and/or diagnosis of SARS-CoV-2 by FDA under an Emergency Use Authorization (EUA). This EUA will remain  in effect (meaning this test can be used) for the duration of the COVID-19 declaration under Section 564(b)(1) of the Act, 21 U.S.C.section 360bbb-3(b)(1), unless the authorization is terminated  or revoked sooner.       Influenza A by PCR NEGATIVE NEGATIVE Final   Influenza B by PCR NEGATIVE NEGATIVE Final    Comment: (NOTE) The Xpert Xpress SARS-CoV-2/FLU/RSV plus assay is intended as an aid in the diagnosis of influenza from Nasopharyngeal swab specimens and should not be used as a sole basis for treatment. Nasal washings and aspirates are unacceptable for Xpert Xpress SARS-CoV-2/FLU/RSV testing.  Fact Sheet for Patients: EntrepreneurPulse.com.au  Fact Sheet for Healthcare Providers: IncredibleEmployment.be  This test is not yet approved or cleared by the [place] FDA and has been authorized for detection and/or diagnosis of SARS-CoV-2 by FDA under an Emergency Use Authorization (EUA). This EUA will remain in effect (meaning this test can be used) for the duration of the COVID-19 declaration under Section 564(b)(1) of the Act, 21 U.S.C. section 360bbb-3(b)(1),  unless the authorization is terminated or revoked.  Performed at [facility], [place] [address] [place], [place] [address]       Radiology Studies: DG Chest Portable 1 View  Result Date: 12/12/2020 CLINICAL DATA:  Shortness of breath EXAM: PORTABLE CHEST 1 VIEW COMPARISON:  11/08/2019, CT 05/22/2018 FINDINGS: Interval cardiomegaly with vascular congestion. No consolidation, pleural effusion, or pneumothorax.  IMPRESSION: Interval cardiomegaly with vascular congestion. Cardiomegaly could be secondary to multi chamber enlargement versus pericardial effusion. Electronically Signed   By: Donavan Foil M.D.   On: 12/12/2020 19:47       LOS: 2 days   [facility] Pager on [email or web address]  12/14/2020, 10:19 AM

## 2020-12-14 NOTE — Progress Notes (Signed)
  Echocardiogram 2D Echocardiogram has been performed.  Sherri Dixon 12/14/2020, 2:26 PM

## 2020-12-15 LAB — BASIC METABOLIC PANEL
Anion gap: 7 (ref 5–15)
BUN: 8 mg/dL (ref 6–20)
CO2: 26 mmol/L (ref 22–32)
Calcium: 8.1 mg/dL — ABNORMAL LOW (ref 8.9–10.3)
Chloride: 108 mmol/L (ref 98–111)
Creatinine, Ser: 1.04 mg/dL — ABNORMAL HIGH (ref 0.44–1.00)
GFR, Estimated: >60 mL/min (ref >60)
Glucose, Bld: 100 mg/dL — ABNORMAL HIGH (ref 70–99)
Potassium: 3.6 mmol/L (ref 3.5–5.1)
Sodium: 141 mmol/L (ref 135–145)

## 2020-12-15 LAB — HEMOGLOBIN A1C
Hgb A1c MFr Bld: 10 % — ABNORMAL HIGH (ref 4.8–5.6)
Mean Plasma Glucose: 240.3 mg/dL

## 2020-12-15 LAB — CBC
HCT: 24.4 % — ABNORMAL LOW (ref 36.0–46.0)
Hemoglobin: 7.1 g/dL — ABNORMAL LOW (ref 12.0–15.0)
MCH: 23.3 pg — ABNORMAL LOW (ref 26.0–34.0)
MCHC: 29.1 g/dL — ABNORMAL LOW (ref 30.0–36.0)
MCV: 80 fL (ref 80.0–100.0)
Platelets: 291 10*3/uL (ref 150–400)
RBC: 3.05 MIL/uL — ABNORMAL LOW (ref 3.87–5.11)
RDW: 17.9 % — ABNORMAL HIGH (ref 11.5–15.5)
WBC: 9 10*3/uL (ref 4.0–10.5)
nRBC: 0 % (ref 0.0–0.2)

## 2020-12-15 LAB — GLUCOSE, CAPILLARY
Glucose-Capillary: 59 mg/dL — ABNORMAL LOW (ref 70–99)
Glucose-Capillary: 69 mg/dL — ABNORMAL LOW (ref 70–99)
Glucose-Capillary: 69 mg/dL — ABNORMAL LOW (ref 70–99)
Glucose-Capillary: 70 mg/dL (ref 70–99)
Glucose-Capillary: 72 mg/dL (ref 70–99)
Glucose-Capillary: 92 mg/dL (ref 70–99)
Glucose-Capillary: 94 mg/dL (ref 70–99)

## 2020-12-15 LAB — MAGNESIUM: Magnesium: 1.7 mg/dL (ref 1.7–2.4)

## 2020-12-15 MED ORDER — HYDRALAZINE HCL 50 MG PO TABS: 50.0000 mg | ORAL_TABLET | Freq: Three times a day (TID) | ORAL | 1 refills | Status: DC

## 2020-12-15 MED ORDER — METOPROLOL SUCCINATE ER 100 MG PO TB24: 100.0000 mg | ORAL_TABLET | Freq: Every day | ORAL | 1 refills | Status: DC

## 2020-12-15 MED ORDER — INSULIN GLARGINE 100 UNIT/ML ~~LOC~~ SOLN: 50.0000 [IU] | Freq: Every day | SUBCUTANEOUS | Status: DC

## 2020-12-15 MED ORDER — FUROSEMIDE 40 MG PO TABS: ORAL_TABLET | ORAL | 1 refills | Status: DC

## 2020-12-15 MED ORDER — FERROUS SULFATE 325 (65 FE) MG PO TBEC: 325.0000 mg | DELAYED_RELEASE_TABLET | Freq: Two times a day (BID) | ORAL | 3 refills | Status: DC

## 2020-12-15 MED ORDER — BASAGLAR KWIKPEN 100 UNIT/ML ~~LOC~~ SOPN: 50.0000 [IU] | PEN_INJECTOR | Freq: Every day | SUBCUTANEOUS | Status: DC

## 2020-12-15 NOTE — Progress Notes (Signed)
Pt FSBG checked per pt request, FSBG 59. Pt given OJ, and graham crackers. Will recheck.

## 2020-12-15 NOTE — Discharge Summary (Addendum)
[facility]  Physician Discharge Summary   Patient ID: Sherri Dixon MRN: [id number] DOB/AGE: [birth year] 40 y.o.  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 Please pursue further work-up for further hypoalbuminemia in the outpatient setting.  Will likely need SPEP and UPEP due to reversal of A/G ratio. 2. Further management of hypertension in the outpatient setting 3. Consider checking electrolytes at follow-up.    Home Health: None Equipment/Devices: None  CODE STATUS: Full code  DISCHARGE CONDITION: fair  Diet recommendation: Modified carbohydrate  INITIAL HISTORY: 40 y.o.femalewithhistory of essential hypertension, diabetes mellitus, diastolic dysfunction, history of PE has been noticing increasing lower extremity edema and exertional shortness of breath for the last 1 month despite taking her Lasix. Patient also has not taken couple of her antihypertensives for the last 1 week as she ran out of it. Had gone to her PCP today was found to have weight gain of 40 pounds over the last 1 month and advised to come to the ER.  She was hospitalized for further management of her CHF exacerbation.  Consultations:  None  Procedures:  Transthoracic echocardiogram 3/18 1. Left ventricular ejection fraction, by estimation, is 55 to 60%. The  left ventricle has normal function. The left ventricle has no regional  wall motion abnormalities. There is mild left ventricular hypertrophy.  Left ventricular diastolic parameters  are consistent with Grade II diastolic dysfunction (pseudonormalization).  2. Right ventricular systolic function is normal. The  right ventricular  size is normal. Tricuspid regurgitation signal is inadequate for assessing  PA pressure.  3. Left atrial size was mildly dilated.  4. The mitral valve is normal in structure. Trivial mitral valve  regurgitation. No evidence of mitral stenosis.  5. The aortic valve is grossly normal. Aortic valve regurgitation is not  visualized. No aortic stenosis is present.  6. A small pericardial effusion is present. There is no evidence of  cardiac tamponade.  7. The inferior vena cava is dilated in size with <50% respiratory  variability, suggesting right atrial pressure of 15 mmHg.    HOSPITAL COURSE:    Acute diastolic CHF Based on echocardiogram from 2020 patient had normal systolic function.  Diastolic dysfunction was noted.  Echocardiogram repeated and shows grade 3 diastolic dysfunction.  Patient was treated with intravenous furosemide with good diuresis and improvement in symptoms.   Will be discharged on higher dose of furosemide for a few days. EKG does not show any ischemic ST segment changes.  Nonspecific T wave changes noted.  Patient reports that she has been seen by Dr. Terrence Dupont previously and underwent stress testing within the last 2 years which was unremarkable.  TSH noted to be 5.0.  No clear symptoms of hypothyroidism identified.  Free T4 was normal at 0.86.  Hypertensive urgency Patient had significantly elevated blood pressures at presentation likely because she ran out of some of her antihypertensives.  Blood pressure remains poorly controlled despite resumption of her antihypertensives.    Patient's medication doses were adjusted the increase in dose of hydralazine and metoprolol.  ARB can be continued.  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    Lantus insulin but her glucose levels were noted to  be low.  This is likely due to the dietary discrepancy between what she eats at home and what she is eating here.  Patient understands that she needs to check her CBGs at home.  Instructions provided to the patient to start off with a lower dose of her insulin depending on her glucose levels.  HbA1c 10.0 implying very poor glycemic control.  Chronic kidney disease stage II Renal function at baseline.   Hypoalbuminemia Reason for this is not entirely clear.  Patient denies any history of liver disease.  LFTs are normal.  UA does show proteinuria but does not appear to be significant.    She is noted to have reversal of the A/G ratio.  Will need outpatient work-up by PCP.  Microcytic anemia/iron deficiency She has a longstanding history of iron deficiency and used to receive iron infusions in the past although she has not received any in quite a while.  Ferritin noted to be 7.  B34 folic acid level normal.    She was given iron infusion and will be discharged on ferrous sulfate.   No overt bleeding here in the hospital.   History of pulmonary embolism Patient is on rivaroxaban.  Hyperlipidemia Continue statin  Morbid obesity Estimated body mass index is 55.76 kg/m as calculated from the following:   Height as of this encounter: 5\' 5"  (1.651 m).   Weight as of this encounter: 152 kg.   Overall stable.  Okay for discharge home today.  PERTINENT LABS:  The results of significant diagnostics from this hospitalization (including imaging, microbiology, ancillary and laboratory) are listed below for reference.    Microbiology: Recent Results (from the past 240 hour(s))  Resp Panel by RT-PCR (Flu A&B, Covid) Nasopharyngeal Swab     Status: None   Collection Time: 12/12/20  9:10 PM   Specimen: Nasopharyngeal Swab; Nasopharyngeal(NP) swabs in vial transport medium  Result Value Ref Range Status   SARS Coronavirus 2 by RT PCR NEGATIVE NEGATIVE Final    Comment: (NOTE) SARS-CoV-2  target nucleic acids are NOT DETECTED.  The SARS-CoV-2 RNA is generally detectable in upper respiratory specimens during the acute phase of infection. The lowest concentration of SARS-CoV-2 viral copies this assay can detect is 138 copies/mL. A negative result does not preclude SARS-Cov-2 infection and should not be used as the sole basis for treatment or other patient management decisions. A negative result may occur with  improper specimen collection/handling, submission of specimen other than nasopharyngeal swab, presence of viral mutation(s) within the areas targeted by this assay, and inadequate number of viral copies(<138 copies/mL). A negative result must be combined with clinical observations, patient history, and epidemiological information. The expected result is Negative.  Fact Sheet for Patients:  EntrepreneurPulse.com.au  Fact Sheet for Healthcare Providers:  IncredibleEmployment.be  This test is no t yet approved or cleared by the [place] FDA and  has been authorized for detection and/or diagnosis of SARS-CoV-2 by FDA under an Emergency Use Authorization (EUA). This EUA will remain  in effect (meaning this test can be used) for the duration of the COVID-19 declaration under Section 564(b)(1) of the Act, 21 U.S.C.section 360bbb-3(b)(1), unless the authorization is terminated  or revoked sooner.       Influenza A by PCR NEGATIVE NEGATIVE Final   Influenza B by PCR NEGATIVE NEGATIVE Final    Comment: (NOTE) The Xpert Xpress SARS-CoV-2/FLU/RSV plus assay is intended  as an aid in the diagnosis of influenza from Nasopharyngeal swab specimens and should not be used as a sole basis for treatment. Nasal washings and aspirates are unacceptable for Xpert Xpress SARS-CoV-2/FLU/RSV testing.  Fact Sheet for Patients: EntrepreneurPulse.com.au  Fact Sheet for Healthcare  Providers: IncredibleEmployment.be  This test is not yet approved or cleared by the [place] FDA and has been authorized for detection and/or diagnosis of SARS-CoV-2 by FDA under an Emergency Use Authorization (EUA). This EUA will remain in effect (meaning this test can be used) for the duration of the COVID-19 declaration under Section 564(b)(1) of the Act, 21 U.S.C. section 360bbb-3(b)(1), unless the authorization is terminated or revoked.  Performed at [facility], [place] [address], [place], [place] [address]      Labs:  COVID-19 Labs  Recent Labs    12/14/20 3419  FERRITIN 7*    Lab Results  Component Value Date   SARSCOV2NAA NEGATIVE 12/12/2020   SARSCOV2NAA NOT DETECTED 07/25/2019   [place] NEGATIVE 03/10/2019      Basic Metabolic Panel: Recent Labs  Lab 12/12/20 1823 12/13/20 0530 12/14/20 0514 12/15/20 0524  NA 140 142 142 141  K 3.4* 3.1* 3.3* 3.6  CL 107 109 110 108  CO2 26 23 25 26   GLUCOSE 115* 179* 83 100*  BUN 10 10 10 8   CREATININE 1.04* 1.08* 1.00 1.04*  CALCIUM 8.5* 8.1* 8.1* 8.1*  MG  --   --  1.6* 1.7   Liver Function Tests: Recent Labs  Lab 12/12/20 1823 12/13/20 0530  AST 28 25  ALT 20 16  ALKPHOS 96 81  BILITOT 0.4 0.4  PROT 5.8* 4.9*  ALBUMIN 1.9* 1.5*   CBC: Recent Labs  Lab 12/12/20 1823 12/13/20 0530 12/14/20 0514 12/15/20 0524  WBC 11.6* 10.0 8.9 9.0  NEUTROABS 7.1 5.7  --   --   HGB 8.3* 7.0* 7.1* 7.1*  HCT 27.9* 23.8* 24.1* 24.4*  MCV 78.8* 78.8* 79.3* 80.0  PLT 336 283 288 291   BNP: BNP (last 3 results) Recent Labs    12/12/20 1823  BNP 294.8*     CBG: Recent Labs  Lab 12/15/20 0209 12/15/20 0211 12/15/20 0242 12/15/20 0318 12/15/20 0728  GLUCAP 69* 72 70 92 94     IMAGING STUDIES DG Chest Portable 1 View  Result Date: 12/12/2020 CLINICAL DATA:  Shortness of breath EXAM: PORTABLE CHEST 1 VIEW COMPARISON:  11/08/2019, CT 05/22/2018 FINDINGS:  Interval cardiomegaly with vascular congestion. No consolidation, pleural effusion, or pneumothorax. IMPRESSION: Interval cardiomegaly with vascular congestion. Cardiomegaly could be secondary to multi chamber enlargement versus pericardial effusion. Electronically Signed   By: Donavan Foil M.D.   On: 12/12/2020 19:47   ECHOCARDIOGRAM COMPLETE  Result Date: 12/14/2020    ECHOCARDIOGRAM REPORT   Patient Name:   Sherri Dixon Date of Exam: 12/14/2020 Medical Rec #:  [id number]     Height:       65.0 in Accession #:    [id number]    Weight:       335.1 lb Date of Birth:  August 22, 1981      BSA:          2.463 m Patient Age:    39 years      BP:           184/92 mmHg Patient Gender: F             HR:           79 bpm. Exam  Location:  Inpatient Procedure: 2D Echo, Cardiac Doppler, Color Doppler and Intracardiac            Opacification Agent Indications:    I50.23 Acute on chronic systolic (congestive) heart failure  History:        Patient has prior history of Echocardiogram examinations, most                 recent 03/10/2019. Risk Factors:Diabetes and Dyslipidemia.                 Pulmonary Embolism. GERD.  Sonographer:    Jonelle Sidle Dance Referring Phys: [place]  1. Left ventricular ejection fraction, by estimation, is 55 to 60%. The left ventricle has normal function. The left ventricle has no regional wall motion abnormalities. There is mild left ventricular hypertrophy. Left ventricular diastolic parameters are consistent with Grade II diastolic dysfunction (pseudonormalization).  2. Right ventricular systolic function is normal. The right ventricular size is normal. Tricuspid regurgitation signal is inadequate for assessing PA pressure.  3. Left atrial size was mildly dilated.  4. The mitral valve is normal in structure. Trivial mitral valve regurgitation. No evidence of mitral stenosis.  5. The aortic valve is grossly normal. Aortic valve regurgitation is not visualized. No aortic  stenosis is present.  6. A small pericardial effusion is present. There is no evidence of cardiac tamponade.  7. The inferior vena cava is dilated in size with <50% respiratory variability, suggesting right atrial pressure of 15 mmHg. FINDINGS  Left Ventricle: Left ventricular ejection fraction, by estimation, is 55 to 60%. The left ventricle has normal function. The left ventricle has no regional wall motion abnormalities. Definity contrast agent was given IV to delineate the left ventricular  endocardial borders. The left ventricular internal cavity size was normal in size. There is mild left ventricular hypertrophy. Left ventricular diastolic parameters are consistent with Grade II diastolic dysfunction (pseudonormalization). Right Ventricle: The right ventricular size is normal. No increase in right ventricular wall thickness. Right ventricular systolic function is normal. Tricuspid regurgitation signal is inadequate for assessing PA pressure. Left Atrium: Left atrial size was mildly dilated. Right Atrium: Right atrial size was normal in size. Pericardium: A small pericardial effusion is present. There is no evidence of cardiac tamponade. Mitral Valve: The mitral valve is normal in structure. Mild mitral annular calcification. Trivial mitral valve regurgitation. No evidence of mitral valve stenosis. Tricuspid Valve: The tricuspid valve is normal in structure. Tricuspid valve regurgitation is not demonstrated. No evidence of tricuspid stenosis. Aortic Valve: The aortic valve is grossly normal. Aortic valve regurgitation is not visualized. No aortic stenosis is present. Pulmonic Valve: The pulmonic valve was grossly normal. Pulmonic valve regurgitation is trivial. No evidence of pulmonic stenosis. Aorta: The aortic root is normal in size and structure. Venous: The inferior vena cava is dilated in size with less than 50% respiratory variability, suggesting right atrial pressure of 15 mmHg. IAS/Shunts: No atrial  level shunt detected by color flow Doppler.  LEFT VENTRICLE PLAX 2D LVIDd:         4.50 cm  Diastology LVIDs:         3.00 cm  LV e' medial:   4.68 cm/s LV PW:         1.30 cm  LV E/e' medial: 24.4 LV IVS:        1.10 cm LVOT diam:     2.00 cm LV SV:         95 LV SV  Index:   39 LVOT Area:     3.14 cm  RIGHT VENTRICLE             IVC RV Basal diam:  2.70 cm     IVC diam: 2.30 cm RV S prime:     13.60 cm/s TAPSE (M-mode): 2.1 cm LEFT ATRIUM             Index       RIGHT ATRIUM           Index LA diam:        3.90 cm 1.58 cm/m  RA Area:     11.90 cm LA Vol (A2C):   63.9 ml 25.94 ml/m RA Volume:   27.60 ml  11.20 ml/m LA Vol (A4C):   41.2 ml 16.73 ml/m LA Biplane Vol: 52.9 ml 21.48 ml/m  AORTIC VALVE LVOT Vmax:   148.00 cm/s LVOT Vmean:  100.000 cm/s LVOT VTI:    0.302 m  AORTA Ao Root diam: 3.00 cm Ao Asc diam:  3.00 cm MITRAL VALVE MV Area (PHT): 2.80 cm     SHUNTS MV Decel Time: 271 msec     Systemic VTI:  0.30 m MV E velocity: 114.00 cm/s  Systemic Diam: 2.00 cm MV A velocity: 99.40 cm/s MV E/A ratio:  1.15 Cherlynn Kaiser MD Electronically signed by Cherlynn Kaiser MD Signature Date/Time: 12/14/2020/3:30:31 PM    Final     DISCHARGE EXAMINATION: Vitals:   12/15/20 0412 12/15/20 0416 12/15/20 0604 12/15/20 0944  BP: (!) 168/90  (!) 160/100 (!) 162/94  Pulse: 83  83 80  Resp: 18     Temp: 98.3 F (36.8 C)     TempSrc: Oral     SpO2: 99%     Weight:  (!) 151.1 kg    Height:       General appearance: Awake alert.  In no distress Resp: Clear to auscultation bilaterally.  Normal effort Cardio: S1-S2 is normal regular.  No S3-S4.  No rubs murmurs or bruit GI: Abdomen is soft.  Nontender nondistended.  Bowel sounds are present normal.  No masses organomegaly Improved edema bilateral lower extremities.   DISPOSITION: Home  Discharge Instructions    Call MD for:  difficulty breathing, headache or visual disturbances   Complete by: As directed    Call MD for:  extreme fatigue   Complete by:  As directed    Call MD for:  persistant dizziness or light-headedness   Complete by: As directed    Call MD for:  persistant nausea and vomiting   Complete by: As directed    Call MD for:  severe uncontrolled pain   Complete by: As directed    Call MD for:  temperature >100.4   Complete by: As directed    Diet - low sodium heart healthy   Complete by: As directed    Diet Carb Modified   Complete by: As directed    Discharge instructions   Complete by: As directed    1. Please check your glucose levels 4 times a day, before each meal and at bedtime and maintain a record for your PCP.  If glucose levels remain less than 100 please do not take your insulin tonight. Otherwise start taking half your usual dosage tonight. If your glucose levels are greater than 100 please go up on your insulin dose by 4 units every day.    2. Please also note that your albumin level was noted to be low on  your bloodwork.  This will need to be further worked up by your primary care provider.  3. You would also benefit from taking iron tablets for your anemia.  4.  Please be sure to follow-up with your PCP in 3 to 4 days.    You were cared for by a hospitalist during your hospital stay. If you have any questions about your discharge medications or the care you received while you were in the hospital after you are discharged, you can call the unit and asked to speak with the hospitalist on call if the hospitalist that took care of you is not available. Once you are discharged, your primary care physician will handle any further medical issues. Please note that NO REFILLS for any discharge medications will be authorized once you are discharged, as it is imperative that you return to your primary care physician (or establish a relationship with a primary care physician if you do not have one) for your aftercare needs so that they can reassess your need for medications and monitor your lab values. If you do not have a  primary care physician, you can call [phone] for a physician referral.   Increase activity slowly   Complete by: As directed         Allergies as of 12/15/2020      Reactions   Amlodipine Swelling   Humalog Kwikpen [insulin Lispro] Other (See Comments)   75/50---swelling and ulcers in mouth       Medication List    STOP taking these medications   HYDROcodone-acetaminophen 5-325 MG tablet Commonly known as: Norco     TAKE these medications   Alcohol Swabs 70 % Pads 1 application by Does not apply route 3 (three) times daily before meals.   Basaglar KwikPen 100 UNIT/ML Inject 50 Units into the skin at bedtime. What changed: how much to take   cetirizine 10 MG tablet Commonly known as: ZYRTEC Take 10 mg by mouth daily as needed for allergies.   cyanocobalamin 1000 MCG tablet Take 1,000 mcg by mouth daily. Notes to patient: Las Dose of this Medication was given on 12/15/2020 at 9:45 am   ferrous sulfate 325 (65 FE) MG EC tablet Take 1 tablet (325 mg total) by mouth 2 (two) times daily.   furosemide 40 MG tablet Commonly known as: LASIX Take 2 tablets twice a day for 5 days, then take 1 tablet twice a day till you see your PCP. What changed:   medication strength  how much to take  how to take this  when to take this  additional instructions   gabapentin 300 MG capsule Commonly known as: NEURONTIN Take 300 mg by mouth daily as needed (pain).   glucose blood test strip Use as instructed   hydrALAZINE 50 MG tablet Commonly known as: APRESOLINE Take 1 tablet (50 mg total) by mouth 3 (three) times daily. What changed:   medication strength  how much to take  when to take this Notes to patient: Last Dose of this Medication was given on 12/15/2020 at 9:44 am.   INSULIN SYRINGE 1CC/28G 28G X 1/2" 1 ML Misc 1 application by Does not apply route 3 (three) times daily before meals.   metoprolol succinate 100 MG 24 hr tablet Commonly known as:  TOPROL-XL Take 1 tablet (100 mg total) by mouth daily. What changed:   medication strength  how much to take Notes to patient: Last Dose of this Medication was given on 12/15/2020 at 9:44 am  multivitamin with minerals Tabs tablet Take 1 tablet by mouth daily. Centrum   nitroGLYCERIN 0.4 MG SL tablet Commonly known as: NITROSTAT Place 0.4 mg under the tongue every 5 (five) minutes as needed for chest pain.   NovoLOG FlexPen 100 UNIT/ML FlexPen Generic drug: insulin aspart Inject 12-20 Units as directed 3 (three) times daily.   olmesartan 40 MG tablet Commonly known as: BENICAR Take 40 mg by mouth daily.   potassium chloride SA 20 MEQ tablet Commonly known as: KLOR-CON Take 20 mEq by mouth daily.   simvastatin 20 MG tablet Commonly known as: ZOCOR Take 20 mg by mouth at bedtime. Notes to patient: Please take this Medication tonight at bedtime.   Xarelto 20 MG Tabs tablet Generic drug: rivaroxaban Take 20 mg by mouth daily. Notes to patient: Last Dose of this Medication was given on 12/15/2020 at 9:44 am         Follow-up Information    Tomasa Hose, NP. Schedule an appointment as soon as possible for a visit in 1 week(s).   Specialty: Nurse Practitioner Contact information: [place] [address] [phone]               TOTAL DISCHARGE TIME: 77 minutes  [place]  [facility] Pager on [email or web address]  12/16/2020, 11:50 AM

## 2020-12-15 NOTE — Progress Notes (Signed)
Pt to be discharged to home this afternoon. All discharge instructions including Heart Failure education reviewed with the Pt. All Discharge Medications listed on the AVS and schedules for these Medications reviewed with the Pt. Pt verbalized understanding of all discharge instructions. Discharge AVS with Pt at time of discharge.

## 2020-12-17 ENCOUNTER — Ambulatory Visit: Payer: 59 | Admitting: Occupational Therapy

## 2020-12-19 ENCOUNTER — Ambulatory Visit: Payer: 59 | Admitting: Occupational Therapy

## 2020-12-24 ENCOUNTER — Ambulatory Visit: Payer: 59 | Admitting: Occupational Therapy

## 2020-12-26 ENCOUNTER — Ambulatory Visit: Payer: 59 | Admitting: Occupational Therapy

## 2020-12-31 ENCOUNTER — Ambulatory Visit: Payer: 59 | Admitting: Occupational Therapy

## 2021-01-02 ENCOUNTER — Ambulatory Visit: Payer: 59 | Admitting: Occupational Therapy

## 2021-01-07 ENCOUNTER — Ambulatory Visit: Payer: 59 | Admitting: Occupational Therapy

## 2021-01-09 ENCOUNTER — Ambulatory Visit: Payer: 59 | Admitting: Occupational Therapy

## 2021-01-14 ENCOUNTER — Ambulatory Visit: Payer: 59 | Admitting: Occupational Therapy

## 2021-01-16 ENCOUNTER — Ambulatory Visit: Payer: 59 | Admitting: Occupational Therapy

## 2021-01-21 ENCOUNTER — Ambulatory Visit: Payer: 59 | Admitting: Occupational Therapy

## 2021-01-23 ENCOUNTER — Ambulatory Visit: Payer: 59 | Admitting: Occupational Therapy

## 2021-01-25 ENCOUNTER — Emergency Department (HOSPITAL_COMMUNITY): Payer: 59

## 2021-01-25 ENCOUNTER — Observation Stay (HOSPITAL_COMMUNITY): Payer: 59

## 2021-01-25 ENCOUNTER — Inpatient Hospital Stay (HOSPITAL_COMMUNITY)
Admission: EM | Admit: 2021-01-25 | Discharge: 2021-01-28 | DRG: 065 | Disposition: A | Discharge status: Home / Self Care | Payer: 59 | Attending: Internal Medicine | Admitting: Internal Medicine

## 2021-01-25 ENCOUNTER — Encounter (HOSPITAL_COMMUNITY): Payer: Self-pay | Admitting: *Deleted

## 2021-01-25 ENCOUNTER — Other Ambulatory Visit: Payer: Self-pay

## 2021-01-25 DIAGNOSIS — R809 Proteinuria, unspecified: Secondary | ICD-10-CM | POA: Diagnosis not present

## 2021-01-25 DIAGNOSIS — E1165 Type 2 diabetes mellitus with hyperglycemia: Secondary | ICD-10-CM | POA: Diagnosis not present

## 2021-01-25 DIAGNOSIS — Z79899 Other long term (current) drug therapy: Secondary | ICD-10-CM

## 2021-01-25 DIAGNOSIS — Z794 Long term (current) use of insulin: Secondary | ICD-10-CM

## 2021-01-25 DIAGNOSIS — J45909 Unspecified asthma, uncomplicated: Secondary | ICD-10-CM | POA: Diagnosis not present

## 2021-01-25 DIAGNOSIS — N920 Excessive and frequent menstruation with regular cycle: Secondary | ICD-10-CM | POA: Diagnosis present

## 2021-01-25 DIAGNOSIS — I639 Cerebral infarction, unspecified: Principal | ICD-10-CM

## 2021-01-25 DIAGNOSIS — K219 Gastro-esophageal reflux disease without esophagitis: Secondary | ICD-10-CM | POA: Diagnosis not present

## 2021-01-25 DIAGNOSIS — D509 Iron deficiency anemia, unspecified: Secondary | ICD-10-CM | POA: Diagnosis present

## 2021-01-25 DIAGNOSIS — I4891 Unspecified atrial fibrillation: Secondary | ICD-10-CM | POA: Diagnosis not present

## 2021-01-25 DIAGNOSIS — N281 Cyst of kidney, acquired: Secondary | ICD-10-CM | POA: Diagnosis not present

## 2021-01-25 DIAGNOSIS — I10 Essential (primary) hypertension: Secondary | ICD-10-CM

## 2021-01-25 DIAGNOSIS — R4781 Slurred speech: Secondary | ICD-10-CM | POA: Diagnosis not present

## 2021-01-25 DIAGNOSIS — I509 Heart failure, unspecified: Secondary | ICD-10-CM | POA: Diagnosis not present

## 2021-01-25 DIAGNOSIS — I2699 Other pulmonary embolism without acute cor pulmonale: Secondary | ICD-10-CM | POA: Diagnosis present

## 2021-01-25 DIAGNOSIS — Z86711 Personal history of pulmonary embolism: Secondary | ICD-10-CM | POA: Diagnosis not present

## 2021-01-25 DIAGNOSIS — I160 Hypertensive urgency: Secondary | ICD-10-CM | POA: Diagnosis present

## 2021-01-25 DIAGNOSIS — I110 Hypertensive heart disease with heart failure: Secondary | ICD-10-CM | POA: Diagnosis present

## 2021-01-25 DIAGNOSIS — E785 Hyperlipidemia, unspecified: Secondary | ICD-10-CM | POA: Diagnosis present

## 2021-01-25 DIAGNOSIS — Z86718 Personal history of other venous thrombosis and embolism: Secondary | ICD-10-CM

## 2021-01-25 DIAGNOSIS — Z20822 Contact with and (suspected) exposure to covid-19: Secondary | ICD-10-CM | POA: Diagnosis present

## 2021-01-25 DIAGNOSIS — R29702 NIHSS score 2: Secondary | ICD-10-CM | POA: Diagnosis not present

## 2021-01-25 DIAGNOSIS — Z833 Family history of diabetes mellitus: Secondary | ICD-10-CM | POA: Diagnosis not present

## 2021-01-25 DIAGNOSIS — Z905 Acquired absence of kidney: Secondary | ICD-10-CM

## 2021-01-25 DIAGNOSIS — R29810 Facial weakness: Secondary | ICD-10-CM | POA: Diagnosis present

## 2021-01-25 DIAGNOSIS — Z6841 Body Mass Index (BMI) 40.0 and over, adult: Secondary | ICD-10-CM | POA: Diagnosis not present

## 2021-01-25 DIAGNOSIS — R29703 NIHSS score 3: Secondary | ICD-10-CM | POA: Diagnosis not present

## 2021-01-25 DIAGNOSIS — Z7901 Long term (current) use of anticoagulants: Secondary | ICD-10-CM

## 2021-01-25 DIAGNOSIS — I6381 Other cerebral infarction due to occlusion or stenosis of small artery: Secondary | ICD-10-CM

## 2021-01-25 DIAGNOSIS — Z888 Allergy status to other drugs, medicaments and biological substances status: Secondary | ICD-10-CM

## 2021-01-25 DIAGNOSIS — Z8673 Personal history of transient ischemic attack (TIA), and cerebral infarction without residual deficits: Secondary | ICD-10-CM | POA: Diagnosis present

## 2021-01-25 DIAGNOSIS — E8809 Other disorders of plasma-protein metabolism, not elsewhere classified: Secondary | ICD-10-CM | POA: Diagnosis not present

## 2021-01-25 DIAGNOSIS — Z8249 Family history of ischemic heart disease and other diseases of the circulatory system: Secondary | ICD-10-CM

## 2021-01-25 DIAGNOSIS — Z83438 Family history of other disorder of lipoprotein metabolism and other lipidemia: Secondary | ICD-10-CM

## 2021-01-25 LAB — CBC WITH DIFFERENTIAL/PLATELET
Abs Immature Granulocytes: 0.02 10*3/uL (ref 0.00–0.07)
Basophils Absolute: 0.1 10*3/uL (ref 0.0–0.1)
Basophils Relative: 1 %
Eosinophils Absolute: 0.2 10*3/uL (ref 0.0–0.5)
Eosinophils Relative: 2 %
HCT: 34.2 % — ABNORMAL LOW (ref 36.0–46.0)
Hemoglobin: 10.6 g/dL — ABNORMAL LOW (ref 12.0–15.0)
Immature Granulocytes: 0 %
Lymphocytes Relative: 28 %
Lymphs Abs: 3.2 10*3/uL (ref 0.7–4.0)
MCH: 25.5 pg — ABNORMAL LOW (ref 26.0–34.0)
MCHC: 31 g/dL (ref 30.0–36.0)
MCV: 82.4 fL (ref 80.0–100.0)
Monocytes Absolute: 0.7 10*3/uL (ref 0.1–1.0)
Monocytes Relative: 7 %
Neutro Abs: 7.2 10*3/uL (ref 1.7–7.7)
Neutrophils Relative %: 62 %
Platelets: 329 10*3/uL (ref 150–400)
RBC: 4.15 MIL/uL (ref 3.87–5.11)
RDW: 15.9 % — ABNORMAL HIGH (ref 11.5–15.5)
WBC: 11.5 10*3/uL — ABNORMAL HIGH (ref 4.0–10.5)
nRBC: 0 % (ref 0.0–0.2)

## 2021-01-25 LAB — RESP PANEL BY RT-PCR (FLU A&B, COVID) ARPGX2
Influenza A by PCR: NEGATIVE
Influenza B by PCR: NEGATIVE
SARS Coronavirus 2 by RT PCR: NEGATIVE

## 2021-01-25 LAB — BASIC METABOLIC PANEL
Anion gap: 6 (ref 5–15)
BUN: 12 mg/dL (ref 6–20)
CO2: 24 mmol/L (ref 22–32)
Calcium: 8.6 mg/dL — ABNORMAL LOW (ref 8.9–10.3)
Chloride: 106 mmol/L (ref 98–111)
Creatinine, Ser: 1.1 mg/dL — ABNORMAL HIGH (ref 0.44–1.00)
GFR, Estimated: >60 mL/min (ref >60)
Glucose, Bld: 272 mg/dL — ABNORMAL HIGH (ref 70–99)
Potassium: 3.3 mmol/L — ABNORMAL LOW (ref 3.5–5.1)
Sodium: 136 mmol/L (ref 135–145)

## 2021-01-25 LAB — CBC
HCT: 30.7 % — ABNORMAL LOW (ref 36.0–46.0)
Hemoglobin: 9.3 g/dL — ABNORMAL LOW (ref 12.0–15.0)
MCH: 24.9 pg — ABNORMAL LOW (ref 26.0–34.0)
MCHC: 30.3 g/dL (ref 30.0–36.0)
MCV: 82.1 fL (ref 80.0–100.0)
Platelets: 260 10*3/uL (ref 150–400)
RBC: 3.74 MIL/uL — ABNORMAL LOW (ref 3.87–5.11)
RDW: 15.9 % — ABNORMAL HIGH (ref 11.5–15.5)
WBC: 9.2 10*3/uL (ref 4.0–10.5)
nRBC: 0 % (ref 0.0–0.2)

## 2021-01-25 LAB — CREATININE, SERUM
Creatinine, Ser: 1.12 mg/dL — ABNORMAL HIGH (ref 0.44–1.00)
GFR, Estimated: >60 mL/min (ref >60)

## 2021-01-25 LAB — GLUCOSE, CAPILLARY: Glucose-Capillary: 256 mg/dL — ABNORMAL HIGH (ref 70–99)

## 2021-01-25 LAB — PROTIME-INR
INR: 1 (ref 0.8–1.2)
Prothrombin Time: 12.7 seconds (ref 11.4–15.2)

## 2021-01-25 LAB — APTT: aPTT: 28 seconds (ref 24–36)

## 2021-01-25 IMAGING — CT CT HEAD W/O CM
3 series · 15 of 47 positions shown, 18 images · non-contrast
Comparison: None.

CLINICAL DATA: Nausea, emesis, headache for 3 days

EXAM:
CT HEAD WITHOUT CONTRAST
TECHNIQUE: Contiguous axial images were obtained from the base of the skull
through the vertex without intravenous contrast.

[Series 2: head wo · axial · 0.47mm/px · z∈[-224,-94]mm · 9 of 32 slices shown, 12 images]
[im 3/32  brain]
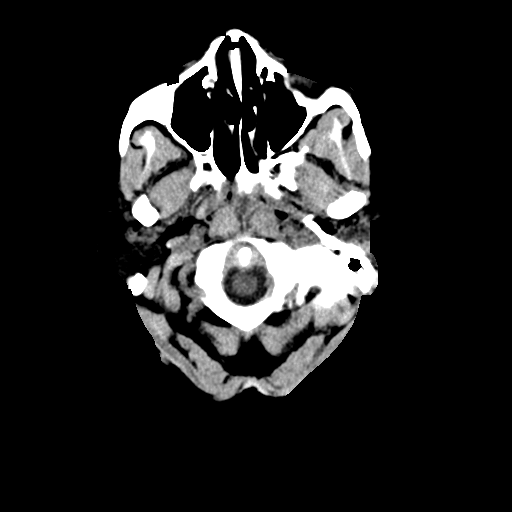
[im 3/32  bone]
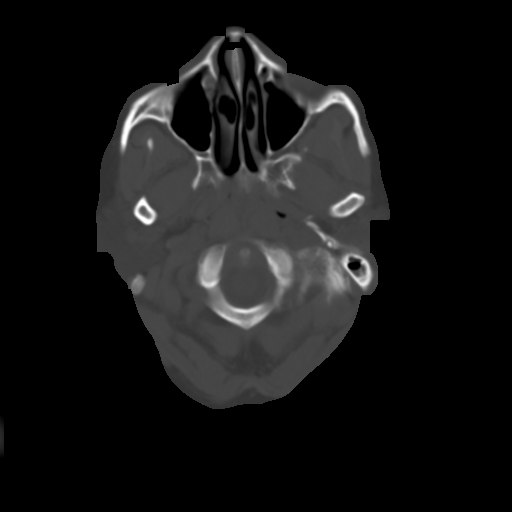
[im 6/32  brain]
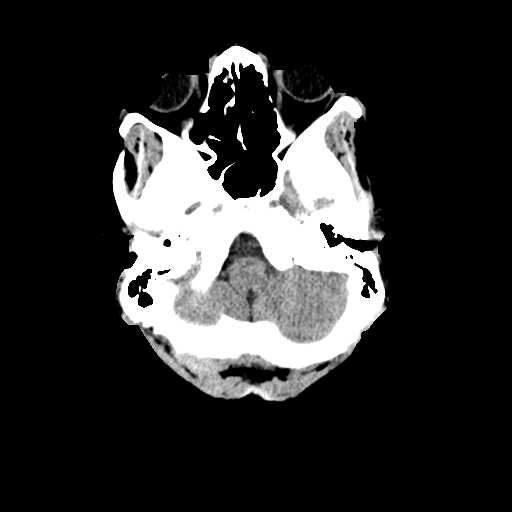
[im 9/32  brain]
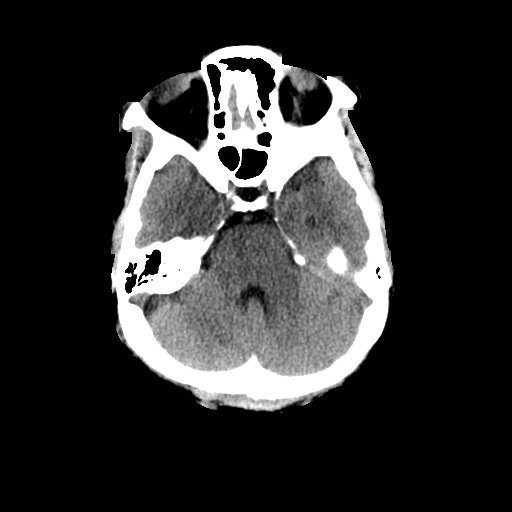
[im 12/32  brain]
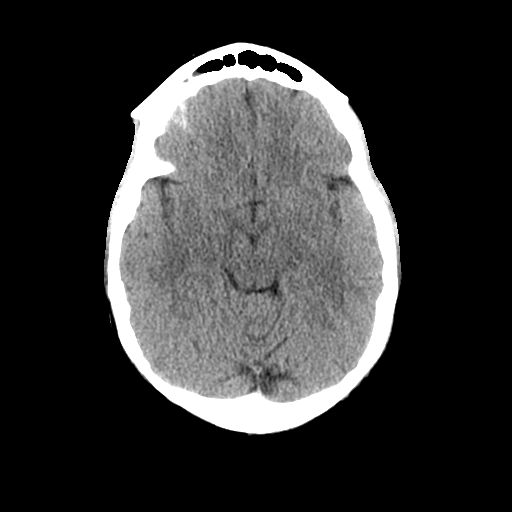
[im 17/32  brain]
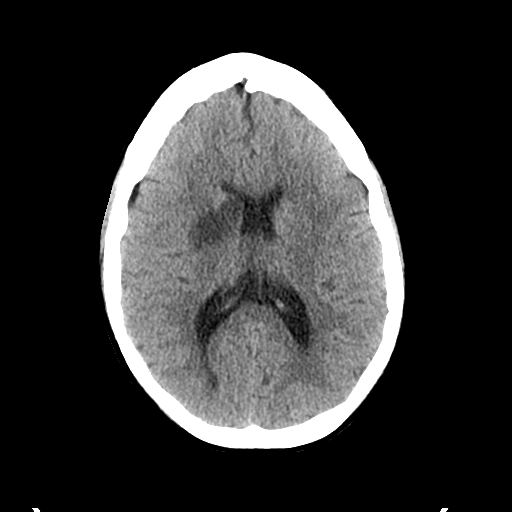
[im 17/32  bone]
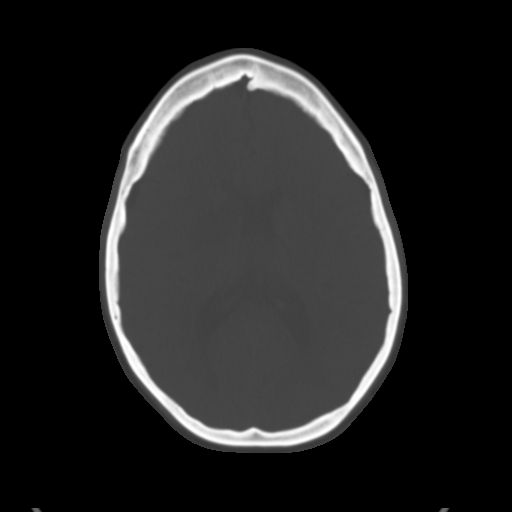
[im 20/32  brain]
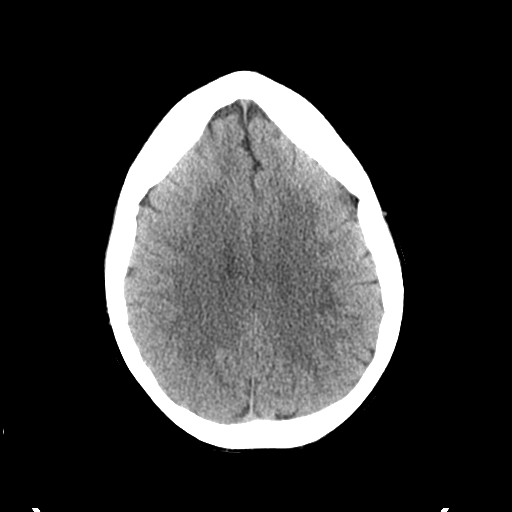
[im 23/32  brain]
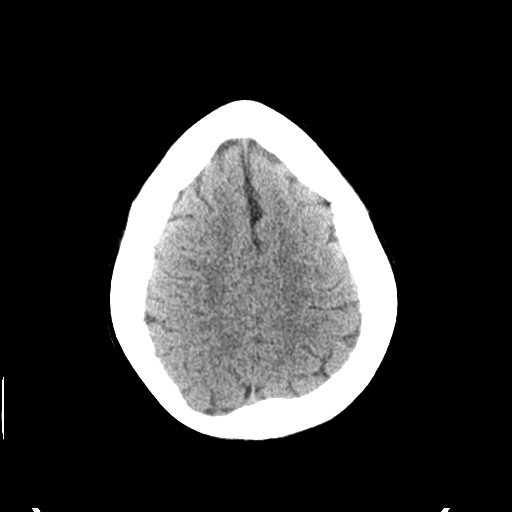
[im 26/32  brain]
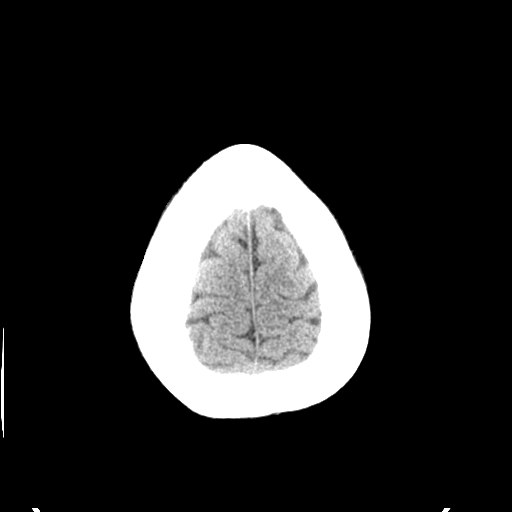
[im 29/32  brain]
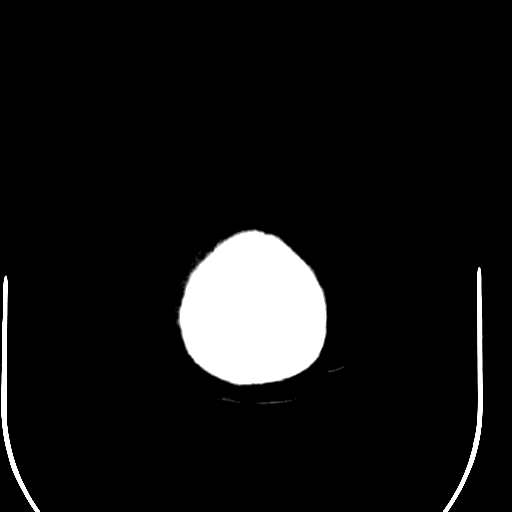
[im 29/32  bone]
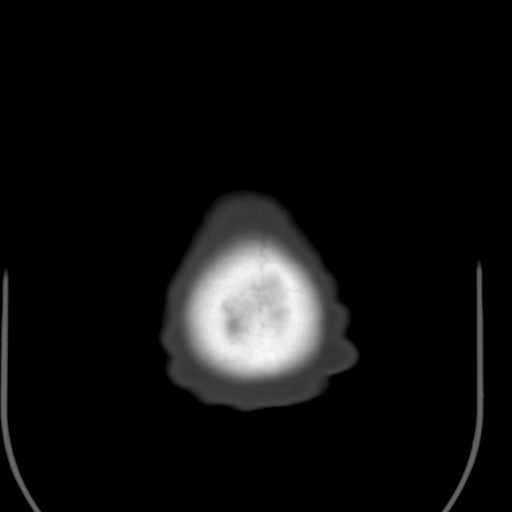

[Series 5: coronal soft tissue · coronal · 0.32mm/px · 3 of 84 slices shown]
[im 28/84  brain]
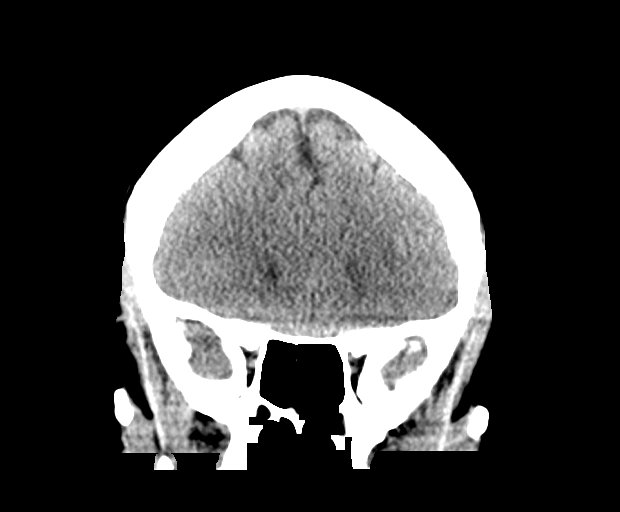
[im 37/84  brain]
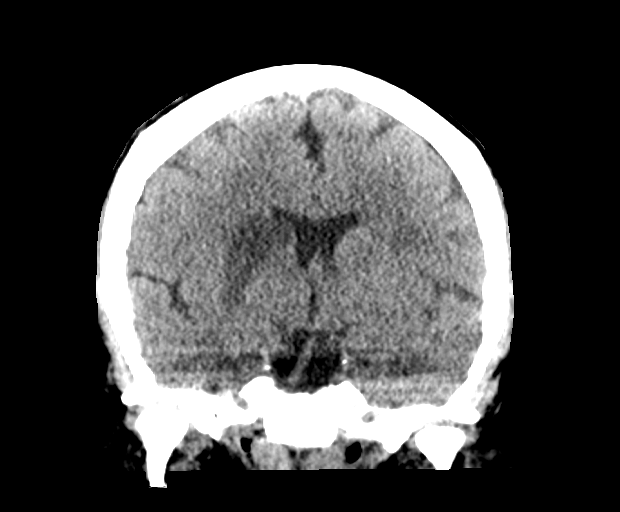
[im 47/84  brain]
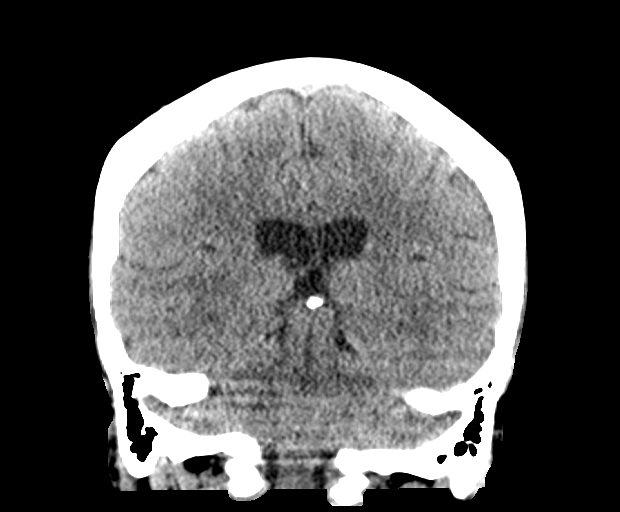

[Series 6: sagittal soft tissue · sagittal · 0.33mm/px · 3 of 61 slices shown]
[im 21/61  brain]
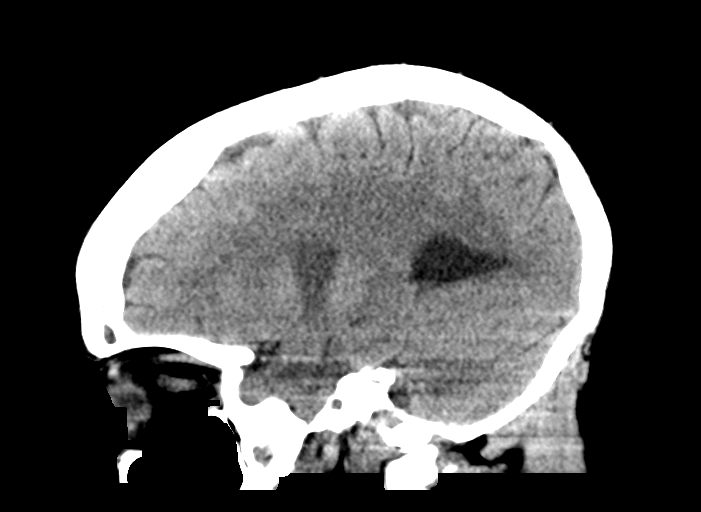
[im 31/61  brain]
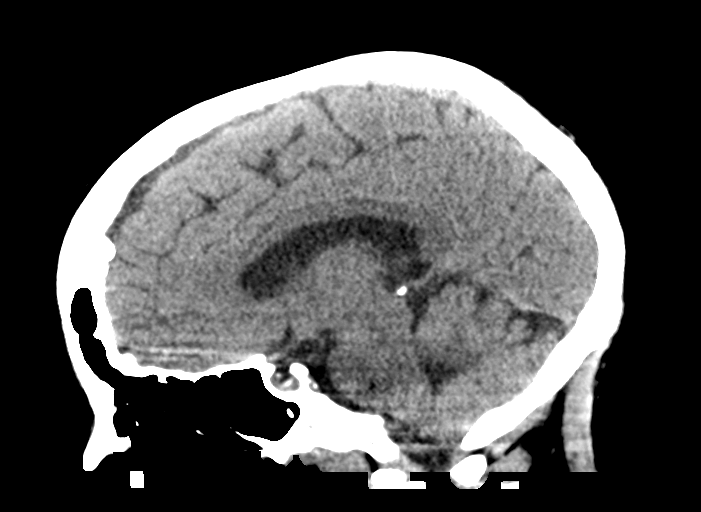
[im 41/61  brain]
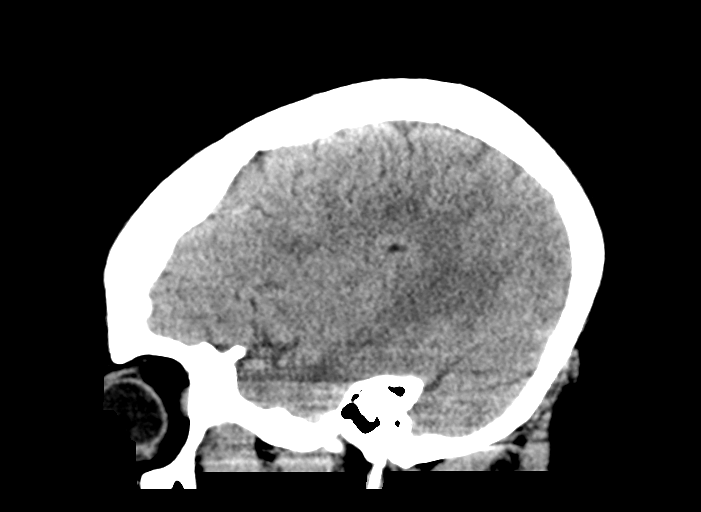

[15 of 47 positions shown; findings below may reference images not displayed]

FINDINGS: Brain: Hypodensity centered in the right basal ganglia measuring
approximately 3.2 x 1.9 cm most consistent with acute to subacute
infarct. No significant mass effect. No evidence of acute
hemorrhage.

The lateral ventricles and remaining midline structures are
unremarkable. There are no acute extra-axial fluid collections.

Vascular: No hyperdense vessel or unexpected calcification.

Skull: Normal. Negative for fracture or focal lesion.

Sinuses/Orbits: Mild mucosal thickening within the frontal and right
maxillary sinuses.

Other: None.
IMPRESSION: 1. Acute to subacute infarct centered in the right basal ganglia. No
evidence of hemorrhagic transformation or significant mass effect.

Critical Value/emergent results were called by telephone at the time
of interpretation on [DATE] at [DATE] to provider RUDWAN
RUDWAN , who verbally acknowledged these results.

## 2021-01-25 MED ORDER — ACETAMINOPHEN 325 MG PO TABS
650.0000 mg | ORAL_TABLET | ORAL | Status: DC | PRN
  Administered 2021-01-27: 650 mg via ORAL
  Filled 2021-01-25: qty 2

## 2021-01-25 MED ORDER — ENOXAPARIN SODIUM 40 MG/0.4ML IJ SOSY
40.0000 mg | PREFILLED_SYRINGE | INTRAMUSCULAR | Status: DC
  Administered 2021-01-25: 40 mg via SUBCUTANEOUS
  Filled 2021-01-25: qty 0.4

## 2021-01-25 MED ORDER — ACETAMINOPHEN 650 MG RE SUPP: 650.0000 mg | RECTAL | Status: DC | PRN

## 2021-01-25 MED ORDER — ASPIRIN 300 MG RE SUPP: 300.0000 mg | Freq: Every day | RECTAL | Status: DC

## 2021-01-25 MED ORDER — LABETALOL HCL 5 MG/ML IV SOLN
20.0000 mg | Freq: Once | INTRAVENOUS | Status: AC
  Administered 2021-01-25: 20 mg via INTRAVENOUS
  Filled 2021-01-25: qty 4

## 2021-01-25 MED ORDER — LABETALOL HCL 5 MG/ML IV SOLN: 40.0000 mg | INTRAVENOUS | Status: DC | PRN

## 2021-01-25 MED ORDER — ONDANSETRON HCL 4 MG/2ML IJ SOLN
4.0000 mg | Freq: Once | INTRAMUSCULAR | Status: AC
  Administered 2021-01-25: 4 mg via INTRAVENOUS
  Filled 2021-01-25: qty 2

## 2021-01-25 MED ORDER — LABETALOL HCL 5 MG/ML IV SOLN
40.0000 mg | INTRAVENOUS | Status: DC | PRN
  Filled 2021-01-25 (×2): qty 8

## 2021-01-25 MED ORDER — ASPIRIN 325 MG PO TABS
325.0000 mg | ORAL_TABLET | Freq: Every day | ORAL | Status: DC
  Administered 2021-01-25: 325 mg via ORAL
  Filled 2021-01-25: qty 1

## 2021-01-25 MED ORDER — ACETAMINOPHEN 160 MG/5ML PO SOLN: 650.0000 mg | ORAL | Status: DC | PRN

## 2021-01-25 MED ORDER — INSULIN ASPART 100 UNIT/ML IJ SOLN
0.0000 [IU] | Freq: Three times a day (TID) | INTRAMUSCULAR | Status: DC
  Administered 2021-01-26: 3 [IU] via SUBCUTANEOUS
  Filled 2021-01-25: qty 0.09

## 2021-01-25 MED ORDER — STROKE: EARLY STAGES OF RECOVERY BOOK
Freq: Once | Status: AC
  Filled 2021-01-25 (×2): qty 1

## 2021-01-25 MED ORDER — LABETALOL HCL 5 MG/ML IV SOLN
10.0000 mg | INTRAVENOUS | Status: DC | PRN
  Administered 2021-01-25 – 2021-01-26 (×2): 10 mg via INTRAVENOUS
  Filled 2021-01-25: qty 4

## 2021-01-25 NOTE — ED Triage Notes (Signed)
Emergency Medicine Provider Triage Evaluation Note  Sherri Dixon , a 40 y.o. female  was evaluated in triage.  Pt complains of headaches, nausea, vomiting, going on for last 3 days, denies change in vision, paresthesia or weakness of her lower extremities.  Has no history of migraines, denies recent falls, not on anticoagulant..  Review of Systems  Positive: Headaches, nausea, vomiting Negative: Denies paresthesias, weakness, change in vision  Physical Exam  BP (!) 215/124 (BP Location: Right Arm)   Pulse 95   Temp 97.9 F (36.6 C) (Oral)   Resp 18   LMP 01/11/2021   SpO2 97%  Gen:   Awake, no distress   HEENT:  Atraumatic  Resp:  Normal effort  Cardiac:  Normal rate  MSK:   Moves extremities without difficulty  Neuro:  Cranial nerves II through XII grossly intact, patient equal strength in the upper lower extremities.  Medical Decision Making  Medically screening exam initiated at 2:57 PM.  Appropriate orders placed.  Jayleana Colberg was informed that the remainder of the evaluation will be completed by another provider, this initial triage assessment does not replace that evaluation, and the importance of remaining in the ED until their evaluation is complete.  Clinical Impression  Presents with headache, nausea, vomiting, labwork imaging have been ordered, patient will need further work-up here in the emergency department.   Marcello Fennel, PA-C 01/25/21 1458

## 2021-01-25 NOTE — H&P (Signed)
History and Physical    Teigen Parslow IOX:[id number] DOB: 1981/09/11 DOA: 01/25/2021  PCP: Tomasa Hose, NP   Patient coming from: Home.  Chief Complaint: Headache and left facial droop.  HPI: Sherri Dixon is a 40 y.o. female with history of hypertension, diabetes mellitus, CHF, asthma, history of PE who was recently admitted and discharged for CHF on December 15, 2020 last month presents to the ER with complaints of left-sided headache and left facial droop.  Patient has been having the symptoms for last 3 days and the left facial droop was noticed by patient's husband.  Has generalized weakness mostly in the lower extremities.  Denies any difficulty speaking or swallowing.  Denies any visual symptoms.  Last 2 days patient has been having nausea vomiting and was unable to take her medications.  ED Course: In the ER on exam patient has left facial droop and is able to move all extremities without difficulty no difficulty speaking.  No visual symptoms and CT head done shows right basal ganglia acute/subacute infarct.  On-call neurologist was consulted.  Patient admitted for further work-up of stroke.  Patient's blood pressure is also found to be more than 268 systolic.  Since patient's symptoms have been present for more than 3 days at this time neurologist advised okay to control blood pressure.  EKG shows normal sinus rhythm COVID test was negative.  Labs are at baseline.  Review of Systems: As per HPI, rest all negative.   Past Medical History:  Diagnosis Date  . Anemia   . Asthma   . CHF (congestive heart failure) (HCC)    Pt reports that father has CHF, not her  . Diabetes mellitus without complication ([place])   . Dyslipidemia   . GERD (gastroesophageal reflux disease)   . Pulmonary embolism [facility])     Past Surgical History:  Procedure Laterality Date  . MOUTH SURGERY    . ROBOTIC ASSITED PARTIAL NEPHRECTOMY Right 07/28/2019   Procedure: XI ROBOTIC ASSITED PARTIAL NEPHRECTOMY;   Surgeon: Cleon Gustin, MD;  Location: [facility];  Service: Urology;  Laterality: Right;  3 HRS     reports that she has never smoked. She has never used smokeless tobacco. She reports current alcohol use of about 1.0 standard drink of alcohol per week. She reports that she does not use drugs.  Allergies  Allergen Reactions  . Amlodipine Swelling  . Humalog Kwikpen [Insulin Lispro] Other (See Comments)    75/50---swelling and ulcers in mouth     Family History  Problem Relation Age of Onset  . High blood pressure Mother   . Diabetes Mother   . Arthritis Mother   . Hyperlipidemia Mother   . High blood pressure Father   . Hyperlipidemia Father   . High blood pressure Sister   . Diabetes Brother   . Hyperlipidemia Brother   . High blood pressure Brother     Prior to Admission medications   Medication Sig Start Date End Date Taking? Authorizing Provider  cetirizine (ZYRTEC) 10 MG tablet Take 10 mg by mouth daily as needed for allergies.   Yes [provider]  cyanocobalamin 1000 MCG tablet Take 1,000 mcg by mouth daily. 07/25/20  Yes [provider]  furosemide (LASIX) 40 MG tablet Take 2 tablets twice a day for 5 days, then take 1 tablet twice a day till you see your PCP. Patient taking differently: Take 80 mg by mouth 2 (two) times daily. Take 2 tablets twice a day for  5 days, then take 1 tablet twice a day till you see your PCP. 12/15/20  Yes Bonnielee Haff, MD  gabapentin (NEURONTIN) 300 MG capsule Take 300 mg by mouth daily as needed (pain).   Yes [provider]  hydrALAZINE (APRESOLINE) 50 MG tablet Take 1 tablet (50 mg total) by mouth 3 (three) times daily. 12/15/20  Yes Bonnielee Haff, MD  ibuprofen (ADVIL) 200 MG tablet Take 400 mg by mouth every 6 (six) hours as needed for mild pain.   Yes [provider]  insulin aspart (NOVOLOG FLEXPEN) 100 UNIT/ML FlexPen Inject 12-20 Units as directed 3 (three) times daily. Sliding scale 01/03/19  Yes  [provider]  Insulin Glargine (BASAGLAR KWIKPEN) 100 UNIT/ML Inject 50 Units into the skin at bedtime. Patient taking differently: Inject 50 Units into the skin at bedtime. Sliding scale 12/15/20  Yes Bonnielee Haff, MD  metoprolol succinate (TOPROL-XL) 100 MG 24 hr tablet Take 1 tablet (100 mg total) by mouth daily. 12/15/20  Yes Bonnielee Haff, MD  Multiple Vitamin (MULTIVITAMIN WITH MINERALS) TABS tablet Take 1 tablet by mouth daily. Centrum   Yes [provider]  nitroGLYCERIN (NITROSTAT) 0.4 MG SL tablet Place 0.4 mg under the tongue every 5 (five) minutes as needed for chest pain. 04/17/19  Yes [provider]  olmesartan (BENICAR) 40 MG tablet Take 40 mg by mouth daily.  03/20/18  Yes [provider]  potassium chloride SA (KLOR-CON) 20 MEQ tablet Take 20 mEq by mouth daily.   Yes [provider]  simvastatin (ZOCOR) 20 MG tablet Take 20 mg by mouth at bedtime.  03/28/16  Yes [provider]  XARELTO 20 MG TABS tablet Take 20 mg by mouth daily. 10/15/20  Yes [provider]  Alcohol Swabs 70 % PADS 1 application by Does not apply route 3 (three) times daily before meals. 06/06/16   Dhungel, Nishant, MD  ferrous sulfate 325 (65 FE) MG EC tablet Take 1 tablet (325 mg total) by mouth 2 (two) times daily. 12/15/20 04/14/21  Bonnielee Haff, MD  glucose blood test strip Use as instructed 06/06/16   Dhungel, Nishant, MD  Insulin Syringe-Needle U-100 (INSULIN SYRINGE 1CC/28G) 28G X 1/2" 1 ML MISC 1 application by Does not apply route 3 (three) times daily before meals. 06/06/16   Dhungel, Flonnie Overman, MD    Physical Exam: Constitutional: Moderately built and nourished. Vitals:   01/25/21 1845 01/25/21 1900 01/25/21 1915 01/25/21 1930  BP: (!) 205/109 (!) 222/112 (!) 210/114 (!) 220/112  Pulse: 79 83 81 84  Resp: 17 15 18 17   Temp:      TempSrc:      SpO2: 100% 100% 100% 99%  Weight:      Height:       Eyes: Anicteric no pallor. ENMT: No  discharge from the ears eyes nose and mouth. Neck: No mass felt.  No neck rigidity. Respiratory: No rhonchi or crepitations. Cardiovascular: S1-S2 heard. Abdomen: Soft nontender bowel sounds present. Musculoskeletal: No edema. Skin: No rash. Neurologic: Alert awake oriented to time place and person.  Left facial droop.  Pupils equal and reacting to light.  Moves all extremities 5 x 5.  Tongue is midline. Psychiatric: Appears normal.  Normal affect.   Labs on Admission: I have personally reviewed following labs and imaging studies  CBC: Recent Labs  Lab 01/25/21 1457  WBC 11.5*  NEUTROABS 7.2  HGB 10.6*  HCT 34.2*  MCV 82.4  PLT 259   Basic Metabolic Panel: Recent  Labs  Lab 01/25/21 1457  NA 136  K 3.3*  CL 106  CO2 24  GLUCOSE 272*  BUN 12  CREATININE 1.10*  CALCIUM 8.6*   GFR: Estimated Creatinine Clearance: 100.9 mL/min (A) (by C-G formula based on SCr of 1.1 mg/dL (H)). Liver Function Tests: No results for input(s): AST, ALT, ALKPHOS, BILITOT, PROT, ALBUMIN in the last 168 hours. No results for input(s): LIPASE, AMYLASE in the last 168 hours. No results for input(s): AMMONIA in the last 168 hours. Coagulation Profile: Recent Labs  Lab 01/25/21 1804  INR 1.0   Cardiac Enzymes: No results for input(s): CKTOTAL, CKMB, CKMBINDEX, TROPONINI in the last 168 hours. BNP (last 3 results) No results for input(s): PROBNP in the last 8760 hours. HbA1C: No results for input(s): HGBA1C in the last 72 hours. CBG: No results for input(s): GLUCAP in the last 168 hours. Lipid Profile: No results for input(s): CHOL, HDL, LDLCALC, TRIG, CHOLHDL, LDLDIRECT in the last 72 hours. Thyroid Function Tests: No results for input(s): TSH, T4TOTAL, FREET4, T3FREE, THYROIDAB in the last 72 hours. Anemia Panel: No results for input(s): VITAMINB12, FOLATE, FERRITIN, TIBC, IRON, RETICCTPCT in the last 72 hours. Urine analysis:    Component Value Date/Time   COLORURINE STRAW (A)  12/13/2020 0555   APPEARANCEUR CLEAR 12/13/2020 0555   LABSPEC 1.006 12/13/2020 0555   PHURINE 7.0 12/13/2020 0555   GLUCOSEU 50 (A) 12/13/2020 0555   HGBUR SMALL (A) 12/13/2020 0555   BILIRUBINUR NEGATIVE 12/13/2020 0555   KETONESUR NEGATIVE 12/13/2020 0555   PROTEINUR 100 (A) 12/13/2020 0555   UROBILINOGEN 1.0 08/14/2009 0850   NITRITE NEGATIVE 12/13/2020 0555   LEUKOCYTESUR NEGATIVE 12/13/2020 0555   Sepsis Labs: @LABRCNTIP (procalcitonin:4,lacticidven:4) )No results found for this or any previous visit (from the past 240 hour(s)).   Radiological Exams on Admission: CT Head Wo Contrast  Result Date: 01/25/2021 CLINICAL DATA:  Nausea, emesis, headache for 3 days EXAM: CT HEAD WITHOUT CONTRAST TECHNIQUE: Contiguous axial images were obtained from the base of the skull through the vertex without intravenous contrast. COMPARISON:  None. FINDINGS: Brain: Hypodensity centered in the right basal ganglia measuring approximately 3.2 x 1.9 cm most consistent with acute to subacute infarct. No significant mass effect. No evidence of acute hemorrhage. The lateral ventricles and remaining midline structures are unremarkable. There are no acute extra-axial fluid collections. Vascular: No hyperdense vessel or unexpected calcification. Skull: Normal. Negative for fracture or focal lesion. Sinuses/Orbits: Mild mucosal thickening within the frontal and right maxillary sinuses. Other: None. IMPRESSION: 1. 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 No evidence of hemorrhagic transformation or significant mass effect. Critical Value/emergent results were called by telephone at the time of interpretation on 01/25/2021 at 5:50 pm to provider Deno Etienne , who verbally acknowledged these results. Electronically Signed   By: Randa Ngo M.D.   On: 01/25/2021 17:53    EKG: Independently reviewed.  Normal sinus rhythm.  Assessment/Plan Principal Problem:   Acute CVA (cerebrovascular  accident) [facility]) Active Problems:   Morbid obesity ([place])   Pulmonary emboli (HCC)   Hypertensive urgency    1. Acute CVA -discussed with on-call neurologist.  At this time patient's Xarelto will be on hold due to acute CVA.  Patient agreeable.  Swallow evaluation.  Neurochecks.  MRI brain and MRA brain carotid Doppler 2D echo hemoglobin A1c lipid panel has been ordered.  Patient is already taking statins. 2. Hypertensive urgency likely due to patient not able to take medications last 2 days.  Neurology advised okay to control blood pressure since patient's symptoms have been present for more than 24 hours.  We will restart patient's home medications including ARB metoprolol and hydralazine.  As needed IV hydralazine for systolic more than 546.   3. History of PE presently Xarelto on hold due to acute CVA. 4. Diabetes mellitus type 2 on Lantus 50 units at bedtime.  Closely follow CBG and hemoglobin A1c. 5. History of CHF on diuretics which we will hold for tonight but restart tomorrow given the acute CVA. 6. Chronic anemia follow CBC.   DVT prophylaxis: Lovenox.  Patient usually takes Xarelto at home for PE which is on hold must be restarted once okay with neurologist. Code Status: Full code. Family Communication: Patient's husband. Disposition Plan: Home. Consults called: Neurology. Admission status: Observation.   Rise Patience MD [facility] Pager [phone].  If 7PM-7AM, please contact night-coverage [email or web address] Password [facility]  01/25/2021, 7:49 PM

## 2021-01-25 NOTE — ED Notes (Signed)
Patient taken to [facility] by [facility]

## 2021-01-25 NOTE — Consult Note (Addendum)
NEUROLOGY CONSULTATION NOTE   Date of service: January 25, 2021 Patient Name: Sherri Dixon MRN:  [id number] DOB:  06/27/1981 Reason for consult: "Stroke" Requesting Provider: Rise Patience, MD _ _ _   _ __   _ __ _ _  __ __   _ __   __ _  History of Present Illness  Sherri Dixon is a 40 y.o. female with PMH significant for DM2, GERD, PE on Xarelto, HLD who presents with headache x 3 days and facial droop since 4/23. Workup with CTH demonstrated Right basal ganglia acute/subacute stroke. Reports that facial droop was first noted by her husband on Saturday. They did not think too much about it at that time. This has somewhat improved. Reports headache, bifrontal throbbing for the last 3 days with nausea. Headache cocktail here helped and the headache is now gone.  No prio hx of stroke, no family hx of strokes. Takes Xarelto at home but has not taken it in a week. Skips around the time of her period as she has heavy menses. Also tells me that she has been told she has Afibb and was diagnosed with it last year at [facility] but I do not see any documentation. I reviewed prior EKGs in our system with noted prolonged QT interval but no Afibb.  LKW: 01/19/21 NIHSS: 2 for mild facial asymmetry and LLE drift. TPA/Thrombectomy: outside the window MRS: 0   ROS   Constitutional Denies weight loss, fever and chills.   HEENT Denies changes in vision and hearing.   Respiratory Denies SOB and cough.   CV Denies palpitations and CP   GI Denies abdominal pain, nausea, vomiting and diarrhea.   GU Denies dysuria and urinary frequency.   MSK Denies myalgia and joint pain.   Skin Denies rash and pruritus.   Neurological endorses headache but no syncope.   Psychiatric Denies recent changes in mood. Denies anxiety and depression.   Past History   Past Medical History:  Diagnosis Date  . Anemia   . Asthma   . CHF (congestive heart failure) (HCC)    Pt reports that father has CHF, not her  .  Diabetes mellitus without complication ([place])   . Dyslipidemia   . GERD (gastroesophageal reflux disease)   . Pulmonary embolism [facility])    Past Surgical History:  Procedure Laterality Date  . MOUTH SURGERY    . ROBOTIC ASSITED PARTIAL NEPHRECTOMY Right 07/28/2019   Procedure: XI ROBOTIC ASSITED PARTIAL NEPHRECTOMY;  Surgeon: Cleon Gustin, MD;  Location: [facility];  Service: Urology;  Laterality: Right;  3 HRS   Family History  Problem Relation Age of Onset  . High blood pressure Mother   . Diabetes Mother   . Arthritis Mother   . Hyperlipidemia Mother   . High blood pressure Father   . Hyperlipidemia Father   . High blood pressure Sister   . Diabetes Brother   . Hyperlipidemia Brother   . High blood pressure Brother    Social History   Socioeconomic History  . Marital status: Married    Spouse name: Not on file  . Number of children: Not on file  . Years of education: Not on file  . Highest education level: Not on file  Occupational History  . Not on file  Tobacco Use  . Smoking status: Never Smoker  . Smokeless tobacco: Never Used  Vaping Use  . Vaping Use: Never used  Substance and Sexual Activity  . Alcohol use: Yes  Alcohol/week: 1.0 standard drink    Types: 1 Glasses of wine per week    Comment: occas  . Drug use: No  . Sexual activity: Yes    Birth control/protection: None  Other Topics Concern  . Not on file  Social History Narrative  . Not on file   Social Determinants of Health   Financial Resource Strain: Not on file  Food Insecurity: Not on file  Transportation Needs: Not on file  Physical Activity: Not on file  Stress: Not on file  Social Connections: Not on file   Allergies  Allergen Reactions  . Amlodipine Swelling  . Humalog Kwikpen [Insulin Lispro] Other (See Comments)    75/50---swelling and ulcers in mouth     Medications   Medications Prior to Admission  Medication Sig Dispense Refill Last Dose  . cetirizine (ZYRTEC) 10 MG  tablet Take 10 mg by mouth daily as needed for allergies.   01/22/2021  . cyanocobalamin 1000 MCG tablet Take 1,000 mcg by mouth daily.   01/22/2021  . furosemide (LASIX) 40 MG tablet Take 2 tablets twice a day for 5 days, then take 1 tablet twice a day till you see your PCP. (Patient taking differently: Take 80 mg by mouth 2 (two) times daily. Take 2 tablets twice a day for 5 days, then take 1 tablet twice a day till you see your PCP.) 90 tablet 1 01/22/21  . gabapentin (NEURONTIN) 300 MG capsule Take 300 mg by mouth daily as needed (pain).   01/11/2021  . hydrALAZINE (APRESOLINE) 50 MG tablet Take 1 tablet (50 mg total) by mouth 3 (three) times daily. 90 tablet 1 01/22/2021  . ibuprofen (ADVIL) 200 MG tablet Take 400 mg by mouth every 6 (six) hours as needed for mild pain.   01/24/2021 at Unknown time  . insulin aspart (NOVOLOG FLEXPEN) 100 UNIT/ML FlexPen Inject 12-20 Units as directed 3 (three) times daily. Sliding scale   01/22/2021  . Insulin Glargine (BASAGLAR KWIKPEN) 100 UNIT/ML Inject 50 Units into the skin at bedtime. (Patient taking differently: Inject 50 Units into the skin at bedtime. Sliding scale)   01/22/2021  . metoprolol succinate (TOPROL-XL) 100 MG 24 hr tablet Take 1 tablet (100 mg total) by mouth daily. 30 tablet 1 01/22/2021 at 8 am  . Multiple Vitamin (MULTIVITAMIN WITH MINERALS) TABS tablet Take 1 tablet by mouth daily. Centrum   01/22/2021  . nitroGLYCERIN (NITROSTAT) 0.4 MG SL tablet Place 0.4 mg under the tongue every 5 (five) minutes as needed for chest pain.   unk last dose  . olmesartan (BENICAR) 40 MG tablet Take 40 mg by mouth daily.   2 01/22/2021  . potassium chloride SA (KLOR-CON) 20 MEQ tablet Take 20 mEq by mouth daily.   01/22/2021  . simvastatin (ZOCOR) 20 MG tablet Take 20 mg by mouth at bedtime.    01/22/2021  . XARELTO 20 MG TABS tablet Take 20 mg by mouth daily.   01/04/2021 at 8 am  . Alcohol Swabs 70 % PADS 1 application by Does not apply route 3 (three) times daily  before meals. 100 each 3   . ferrous sulfate 325 (65 FE) MG EC tablet Take 1 tablet (325 mg total) by mouth 2 (two) times daily. 60 tablet 3   . glucose blood test strip Use as instructed 100 each 3   . Insulin Syringe-Needle U-100 (INSULIN SYRINGE 1CC/28G) 28G X 1/2" 1 ML MISC 1 application by Does not apply route 3 (three) times daily  before meals. 100 each 0      Vitals   Vitals:   01/25/21 1930 01/25/21 1945 01/25/21 2000 01/25/21 2052  BP: (!) 220/112 (!) 221/112 (!) 228/112 (!) 195/108  Pulse: 84 85 86 84  Resp: 17 16 17 20   Temp:    98.1 F (36.7 C)  TempSrc:   Oral Oral  SpO2: 99% 100% 100% 100%  Weight:      Height:         Body mass index is 55.75 kg/m.  Physical Exam   General: Laying comfortably in bed; in no acute distress. HENT: Normal oropharynx and mucosa. Normal external appearance of ears and nose.  Neck: Supple, no pain or tenderness  CV: No JVD. No peripheral edema.  Pulmonary: Symmetric Chest rise. Normal respiratory effort.  Abdomen: Soft to touch, non-tender.  Ext: No cyanosis, edema, or deformity  Skin: No rash. Normal palpation of skin.   Musculoskeletal: Normal digits and nails by inspection. No clubbing.   Neurologic Examination  Mental status/Cognition: Alert, oriented to self, place, month and year, good attention.  Speech/language: Fluent, comprehension intact, object naming intact, repetition intact.  Cranial nerves:   CN II Pupils equal and reactive to light, no VF deficits    CN III,IV,VI EOM intact, no gaze preference or deviation, no nystagmus    CN V normal sensation in V1, V2, and V3 segments bilaterally    CN VII Mild L facial droop.   CN VIII normal hearing to speech    CN IX & X normal palatal elevation, no uvular deviation    CN XI 5/5 head turn and 5/5 shoulder shrug bilaterally   CN XII midline tongue protrusion    Motor:  Muscle bulk: normal, tone normal, pronator drift yes mild LUE drift. Mvmt Root Nerve  Muscle Right  Left Comments  SA C5/6 Ax Deltoid 5 5   EF C5/6 Mc Biceps 5 5   EE C6/7/8 Rad Triceps 5 4+   WF C6/7 Med FCR     WE C7/8 PIN ECU     F Ab C8/T1 U ADM/FDI 5 5   HF L1/2/3 Fem Illopsoas 4+ 4   KE L2/3/4 Fem Quad 5 5   DF L4/5 D Peron Tib Ant 5 5   PF S1/2 Tibial Grc/Sol 5 4+    Reflexes:  Right Left Comments  Pectoralis      Biceps (C5/6) 1 1   Brachioradialis (C5/6) 2 2    Triceps (C6/7) 2 2    Patellar (L3/4)      Achilles (S1)      Hoffman      Plantar     Jaw jerk    Sensation:  Light touch Intact throughout   Pin prick    Temperature    Vibration   Proprioception    Coordination/Complex Motor:  - Finger to Nose intact BL - Heel to shin are normal - Rapid alternating movement are slowed in LUE - Gait: Deferred.  Labs   CBC:  Recent Labs  Lab 01/25/21 1457 01/25/21 2113  WBC 11.5* 9.2  NEUTROABS 7.2  --   HGB 10.6* 9.3*  HCT 34.2* 30.7*  MCV 82.4 82.1  PLT 329 786    Basic Metabolic Panel:  Lab Results  Component Value Date   NA 136 01/25/2021   K 3.3 (L) 01/25/2021   CO2 24 01/25/2021   GLUCOSE 272 (H) 01/25/2021   BUN 12 01/25/2021   CREATININE 1.12 (H) 01/25/2021  CALCIUM 8.6 (L) 01/25/2021   GFRNONAA >60 01/25/2021   GFRAA 57 (L) 07/30/2019   Lipid Panel: No results found for: LDLCALC HgbA1c:  Lab Results  Component Value Date   HGBA1C 10.0 (H) 12/15/2020   Urine Drug Screen: No results found for: LABOPIA, COCAINSCRNUR, LABBENZ, AMPHETMU, THCU, LABBARB  Alcohol Level No results found for: [facility]  CT Head without contrast: Acute to subacute infarct centered in the right basal ganglia. No evidence of hemorrhagic transformation or significant mass effect.  MR Angio head without contrast and Carotid Duplex BL: Pending,  MRI Brain: pending  Impression   Sherri Dixon is a 40 y.o. female with PMH significant for DM2, GERD, PE on Xarelto, HLD who presents with headache x 3 days and facial droop since 4/23. Her neurologic examination is  notable for mild L facial droop, mild LUE elbow extension weakness and L hip flexion weakness with slowed rapid alternating movements on the left. CTH demonstrated a R basal ganglia ICH.  She has known hx of DVT and PE for which she is on Xarelto. She skips a week of Xarelto around the time of her period as it causes heavy menses and anemia. Reports that she has been told she has Afibb at [facility] about a year ago but I do not see this documented anywhere. I reviewed the prior EKG with p waves visible and no mention of Afibb. She does have prolonged QTc.  Recommendations  Plan:  - Frequent Neuro checks per stroke unit protocol - Recommend brain imaging with MRI Brain without contrast - Recommend Vascular imaging with MRA Angio Head without contrast and US Carotid doppler - TTE with bubble study ordered - Lipid panel with LDL. - Please start statin if LDL > 70 - HbA1c pending. - Antithrombotic - Aspirin 81mg  daily for now. Time of resuming Xarelto after MRI Brain. I think it should be fine to start her on Xarelto as soon as tomorrow given LKW is about a week ago. - Recommend DVT ppx - SBP goal - gradual normotension. - Recommend Telemetry monitoring for arrythmia - Recommend bedside swallow screen prior to PO intake. - Stroke education booklet - Recommend PT/OT/SLP consult  ______________________________________________________________________   Thank you for the opportunity to take part in the care of this patient. If you have any further questions, please contact the neurology consultation attending.  Signed,  [place] Pager Number [phone] _ _ _   _ __   _ __ _ _  __ __   _ __   __ _

## 2021-01-25 NOTE — ED Triage Notes (Signed)
Pt complains of nausea,emesis, headache x 3 days. She cannot keep fluids down. Has not taken BP medication d/t unable to keep down.

## 2021-01-25 NOTE — ED Provider Notes (Signed)
[place] DEPT Provider Note   CSN: [id number] Arrival date & time: 01/25/21  1420     History Chief Complaint  Patient presents with  . Headache  . Emesis    Sherri Dixon is a 40 y.o. female.  HPI     40 year old female comes in a chief complaint of headache and vomiting. She has history of diabetes, hyperlipidemia, PE on Xarelto, CHF.  She reports headaches for the last 2 days, along with nausea.  She has been unable to keep her medications down and came in with elevated blood pressure.  The headache is frontal and described as intermittent headache that will last for few minutes.  With it she reports having an episode where her husband told her that her face appeared " twisted".  Patient also felt like her speech was slurred yesterday.  There is no history of stroke.  Patient thinks her speech is back to baseline normal.   Review of system is negative for any dizziness, one-sided weakness of the extremity, sensory deficits.   Patient denies any heavy smoking or substance use.  Past Medical History:  Diagnosis Date  . Anemia   . Asthma   . CHF (congestive heart failure) (HCC)    Pt reports that father has CHF, not her  . Diabetes mellitus without complication ([place])   . Dyslipidemia   . GERD (gastroesophageal reflux disease)   . Pulmonary embolism [facility])     Patient Active Problem List   Diagnosis Date Noted  . Acute CVA (cerebrovascular accident) ([place]) 01/25/2021  . Acute on chronic congestive heart failure ([place]) 12/12/2020  . Diabetes mellitus type 2 in obese ([place]) 12/12/2020  . Hypertensive urgency 12/12/2020  . B12 deficiency anemia 03/11/2019  . Iron deficiency anemia due to chronic blood loss 03/10/2019  . Anemia 03/10/2019  . Pulmonary emboli ([place]) 05/22/2018  . Obesity, Class III, BMI 40-49.9 (morbid obesity) ([place]) 05/22/2018  . Eczema 05/22/2018  . Morbid obesity ([place]) 06/06/2016  . Uncontrolled diabetes mellitus with  hyperglycemia, with long-term current use of insulin ([place]) 06/06/2016  . Hypokalemia 06/06/2016  . Diabetic ketoacidosis without coma associated with type 1 diabetes mellitus ([place])   . DKA (diabetic ketoacidoses) 06/05/2016  . GERD (gastroesophageal reflux disease)     Past Surgical History:  Procedure Laterality Date  . MOUTH SURGERY    . ROBOTIC ASSITED PARTIAL NEPHRECTOMY Right 07/28/2019   Procedure: XI ROBOTIC ASSITED PARTIAL NEPHRECTOMY;  Surgeon: Cleon Gustin, MD;  Location: [facility] ORS;  Service: Urology;  Laterality: Right;  3 HRS     OB History   No obstetric history on file.     Family History  Problem Relation Age of Onset  . High blood pressure Mother   . Diabetes Mother   . Arthritis Mother   . Hyperlipidemia Mother   . High blood pressure Father   . Hyperlipidemia Father   . High blood pressure Sister   . Diabetes Brother   . Hyperlipidemia Brother   . High blood pressure Brother     Social History   Tobacco Use  . Smoking status: Never Smoker  . Smokeless tobacco: Never Used  Vaping Use  . Vaping Use: Never used  Substance Use Topics  . Alcohol use: Yes    Alcohol/week: 1.0 standard drink    Types: 1 Glasses of wine per week    Comment: occas  . Drug use: No    Home Medications Prior to Admission medications   Medication  Sig Start Date End Date Taking? Authorizing Provider  cetirizine (ZYRTEC) 10 MG tablet Take 10 mg by mouth daily as needed for allergies.   Yes [provider]  cyanocobalamin 1000 MCG tablet Take 1,000 mcg by mouth daily. 07/25/20  Yes [provider]  furosemide (LASIX) 40 MG tablet Take 2 tablets twice a day for 5 days, then take 1 tablet twice a day till you see your PCP. Patient taking differently: Take 80 mg by mouth 2 (two) times daily. Take 2 tablets twice a day for 5 days, then take 1 tablet twice a day till you see your PCP. 12/15/20  Yes Bonnielee Haff, MD  gabapentin (NEURONTIN) 300 MG capsule Take  300 mg by mouth daily as needed (pain).   Yes [provider]  hydrALAZINE (APRESOLINE) 50 MG tablet Take 1 tablet (50 mg total) by mouth 3 (three) times daily. 12/15/20  Yes Bonnielee Haff, MD  ibuprofen (ADVIL) 200 MG tablet Take 400 mg by mouth every 6 (six) hours as needed for mild pain.   Yes [provider]  insulin aspart (NOVOLOG FLEXPEN) 100 UNIT/ML FlexPen Inject 12-20 Units as directed 3 (three) times daily. Sliding scale 01/03/19  Yes [provider]  Insulin Glargine (BASAGLAR KWIKPEN) 100 UNIT/ML Inject 50 Units into the skin at bedtime. Patient taking differently: Inject 50 Units into the skin at bedtime. Sliding scale 12/15/20  Yes Bonnielee Haff, MD  metoprolol succinate (TOPROL-XL) 100 MG 24 hr tablet Take 1 tablet (100 mg total) by mouth daily. 12/15/20  Yes Bonnielee Haff, MD  Multiple Vitamin (MULTIVITAMIN WITH MINERALS) TABS tablet Take 1 tablet by mouth daily. Centrum   Yes [provider]  nitroGLYCERIN (NITROSTAT) 0.4 MG SL tablet Place 0.4 mg under the tongue every 5 (five) minutes as needed for chest pain. 04/17/19  Yes [provider]  olmesartan (BENICAR) 40 MG tablet Take 40 mg by mouth daily.  03/20/18  Yes [provider]  potassium chloride SA (KLOR-CON) 20 MEQ tablet Take 20 mEq by mouth daily.   Yes [provider]  simvastatin (ZOCOR) 20 MG tablet Take 20 mg by mouth at bedtime.  03/28/16  Yes [provider]  XARELTO 20 MG TABS tablet Take 20 mg by mouth daily. 10/15/20  Yes [provider]  Alcohol Swabs 70 % PADS 1 application by Does not apply route 3 (three) times daily before meals. 06/06/16   Dhungel, Nishant, MD  ferrous sulfate 325 (65 FE) MG EC tablet Take 1 tablet (325 mg total) by mouth 2 (two) times daily. 12/15/20 04/14/21  Bonnielee Haff, MD  glucose blood test strip Use as instructed 06/06/16   Dhungel, Nishant, MD  Insulin Syringe-Needle U-100 (INSULIN SYRINGE 1CC/28G) 28G X 1/2"  1 ML MISC 1 application by Does not apply route 3 (three) times daily before meals. 06/06/16   Dhungel, Flonnie Overman, MD    Allergies    Amlodipine and Humalog kwikpen [insulin lispro]  Review of Systems   Review of Systems  Constitutional: Positive for activity change.  Respiratory: Negative for shortness of breath.   Cardiovascular: Negative for chest pain.  Gastrointestinal: Positive for nausea.  Neurological: Positive for facial asymmetry and headaches.  Hematological: Bruises/bleeds easily.  All other systems reviewed and are negative.   Physical Exam Updated Vital Signs BP (!) 222/112   Pulse 83   Temp 97.9 F (36.6 C) (Oral)   Resp 15   Ht 5\' 5"  (1.651 m)   Wt (!) 152 kg  LMP 01/11/2021   SpO2 100%   BMI 55.75 kg/m   Physical Exam Vitals and nursing note reviewed.  Constitutional:      Appearance: She is well-developed.  HENT:     Head: Normocephalic and atraumatic.  Eyes:     Extraocular Movements: Extraocular movements intact.     Pupils: Pupils are equal, round, and reactive to light.  Cardiovascular:     Rate and Rhythm: Normal rate.  Pulmonary:     Effort: Pulmonary effort is normal.  Abdominal:     General: Bowel sounds are normal.  Musculoskeletal:     Cervical back: Normal range of motion and neck supple.  Skin:    General: Skin is warm and dry.  Neurological:     Mental Status: She is alert and oriented to person, place, and time.     GCS: GCS eye subscore is 4. GCS verbal subscore is 5. GCS motor subscore is 6.     Comments: L sided facial droop Pro sensory exam for the face, upper and lower extremities normal.  Upper and lower extremity strength is 4+ out of 5.  Peripheral visual fields are normal, EOMI, cerebellar exam does not reveal any dysmetria     ED Results / Procedures / Treatments   Labs (all labs ordered are listed, but only abnormal results are displayed) Labs Reviewed  BASIC METABOLIC PANEL - Abnormal; Notable for the following  components:      Result Value   Potassium 3.3 (*)    Glucose, Bld 272 (*)    Creatinine, Ser 1.10 (*)    Calcium 8.6 (*)    All other components within normal limits  CBC WITH DIFFERENTIAL/PLATELET - Abnormal; Notable for the following components:   WBC 11.5 (*)    Hemoglobin 10.6 (*)    HCT 34.2 (*)    MCH 25.5 (*)    RDW 15.9 (*)    All other components within normal limits  RESP PANEL BY RT-PCR (FLU A&B, COVID) ARPGX2  PROTIME-INR  APTT  RAPID URINE DRUG SCREEN, HOSP PERFORMED  URINALYSIS, ROUTINE W REFLEX MICROSCOPIC  PREGNANCY, URINE    EKG EKG Interpretation  Date/Time:  Friday January 25 2021 18:35:23 EDT Ventricular Rate:  84 PR Interval:  150 QRS Duration: 78 QT Interval:  408 QTC Calculation: 482 R Axis:   6 Text Interpretation: Normal sinus rhythm Nonspecific T wave abnormality Prolonged QT Abnormal ECG No acute changes Confirmed by Derwood Kaplan [phone]) on 01/25/2021 7:31:03 PM   Radiology CT Head Wo Contrast  Result Date: 01/25/2021 CLINICAL DATA:  Nausea, emesis, headache for 3 days EXAM: CT HEAD WITHOUT CONTRAST TECHNIQUE: Contiguous axial images were obtained from the base of the skull through the vertex without intravenous contrast. COMPARISON:  None. FINDINGS: Brain: Hypodensity centered in the right basal ganglia measuring approximately 3.2 x 1.9 cm most consistent with acute to subacute infarct. No significant mass effect. No evidence of acute hemorrhage. The lateral ventricles and remaining midline structures are unremarkable. There are no acute extra-axial fluid collections. Vascular: No hyperdense vessel or unexpected calcification. Skull: Normal. Negative for fracture or focal lesion. Sinuses/Orbits: Mild mucosal thickening within the frontal and right maxillary sinuses. Other: None. IMPRESSION: 1. 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 No evidence of hemorrhagic transformation or significant mass effect. Critical Value/emergent  results were called by telephone at the time of interpretation on 01/25/2021 at 5:50 pm to provider Berle Mull , who verbally acknowledged these results. Electronically Signed  By: Randa Ngo M.D.   On: 01/25/2021 17:53    Procedures .Critical Care Performed by: Varney Biles, MD Authorized by: Varney Biles, MD   Critical care provider statement:    Critical care time (minutes):  62   Critical care was necessary to treat or prevent imminent or life-threatening deterioration of the following conditions:  CNS failure or compromise and circulatory failure   Critical care was time spent personally by me on the following activities:  Discussions with consultants, evaluation of patient's response to treatment, examination of patient, ordering and performing treatments and interventions, ordering and review of laboratory studies, ordering and review of radiographic studies, pulse oximetry, re-evaluation of patient's condition, obtaining history from patient or surrogate and review of old charts     Medications Ordered in ED Medications  labetalol (NORMODYNE) injection 20 mg (20 mg Intravenous Given 01/25/21 1841)    Followed by  labetalol (NORMODYNE) injection 40 mg (has no administration in time range)  ondansetron (ZOFRAN) injection 4 mg (4 mg Intravenous Given 01/25/21 1840)    ED Course  I have reviewed the triage vital signs and the nursing notes.  Pertinent labs & imaging results that were available during my care of the patient were reviewed by me and considered in my medical decision making (see chart for details).    MDM Rules/Calculators/A&P                          Patient comes in a chief complaint of headaches.  She has had 2 days of headache with allegedly having some facial drooping yesterday.  Last normal was 2 nights ago.   Her neuro exam is unremarkable besides left-sided nasolabial flattening, she has an NIH stroke scale of 1.  Patient is well outside of  any interventional window.  Clinically does not appear to be LVO.  BP significantly elevated.  We will give her IV labetalol to keep the BP below 220/120.  Case discussed with Tamala Julian, neurology.  He has requested that the patient be transferred to [facility].  Results of the ER work-up discussed with the patient.  Final Clinical Impression(s) / ED Diagnoses Final diagnoses:  Acute ischemic stroke [facility])  Primary hypertension    Rx / DC Orders ED Discharge Orders    None       Varney Biles, MD 01/25/21 1936

## 2021-01-26 ENCOUNTER — Observation Stay (HOSPITAL_COMMUNITY): Payer: 59

## 2021-01-26 ENCOUNTER — Inpatient Hospital Stay (HOSPITAL_COMMUNITY): Payer: 59

## 2021-01-26 DIAGNOSIS — Z905 Acquired absence of kidney: Secondary | ICD-10-CM | POA: Diagnosis not present

## 2021-01-26 DIAGNOSIS — I6389 Other cerebral infarction: Secondary | ICD-10-CM

## 2021-01-26 DIAGNOSIS — I639 Cerebral infarction, unspecified: Principal | ICD-10-CM

## 2021-01-26 DIAGNOSIS — Z20822 Contact with and (suspected) exposure to covid-19: Secondary | ICD-10-CM | POA: Diagnosis present

## 2021-01-26 DIAGNOSIS — I4891 Unspecified atrial fibrillation: Secondary | ICD-10-CM | POA: Diagnosis present

## 2021-01-26 DIAGNOSIS — R29702 NIHSS score 2: Secondary | ICD-10-CM | POA: Diagnosis present

## 2021-01-26 DIAGNOSIS — R29810 Facial weakness: Secondary | ICD-10-CM | POA: Diagnosis present

## 2021-01-26 DIAGNOSIS — I110 Hypertensive heart disease with heart failure: Secondary | ICD-10-CM | POA: Diagnosis present

## 2021-01-26 DIAGNOSIS — J45909 Unspecified asthma, uncomplicated: Secondary | ICD-10-CM | POA: Diagnosis present

## 2021-01-26 DIAGNOSIS — K219 Gastro-esophageal reflux disease without esophagitis: Secondary | ICD-10-CM | POA: Diagnosis present

## 2021-01-26 DIAGNOSIS — I509 Heart failure, unspecified: Secondary | ICD-10-CM | POA: Diagnosis present

## 2021-01-26 DIAGNOSIS — E8809 Other disorders of plasma-protein metabolism, not elsewhere classified: Secondary | ICD-10-CM | POA: Diagnosis present

## 2021-01-26 DIAGNOSIS — R809 Proteinuria, unspecified: Secondary | ICD-10-CM | POA: Diagnosis present

## 2021-01-26 DIAGNOSIS — D509 Iron deficiency anemia, unspecified: Secondary | ICD-10-CM | POA: Diagnosis present

## 2021-01-26 DIAGNOSIS — Z86718 Personal history of other venous thrombosis and embolism: Secondary | ICD-10-CM

## 2021-01-26 DIAGNOSIS — Z6841 Body Mass Index (BMI) 40.0 and over, adult: Secondary | ICD-10-CM | POA: Diagnosis not present

## 2021-01-26 DIAGNOSIS — I160 Hypertensive urgency: Secondary | ICD-10-CM | POA: Diagnosis present

## 2021-01-26 DIAGNOSIS — R29703 NIHSS score 3: Secondary | ICD-10-CM | POA: Diagnosis not present

## 2021-01-26 DIAGNOSIS — E785 Hyperlipidemia, unspecified: Secondary | ICD-10-CM | POA: Diagnosis present

## 2021-01-26 DIAGNOSIS — R4781 Slurred speech: Secondary | ICD-10-CM | POA: Diagnosis present

## 2021-01-26 DIAGNOSIS — Z833 Family history of diabetes mellitus: Secondary | ICD-10-CM | POA: Diagnosis not present

## 2021-01-26 DIAGNOSIS — N281 Cyst of kidney, acquired: Secondary | ICD-10-CM | POA: Diagnosis present

## 2021-01-26 DIAGNOSIS — N920 Excessive and frequent menstruation with regular cycle: Secondary | ICD-10-CM | POA: Diagnosis present

## 2021-01-26 DIAGNOSIS — E782 Mixed hyperlipidemia: Secondary | ICD-10-CM | POA: Diagnosis not present

## 2021-01-26 DIAGNOSIS — E1165 Type 2 diabetes mellitus with hyperglycemia: Secondary | ICD-10-CM | POA: Diagnosis present

## 2021-01-26 DIAGNOSIS — Z86711 Personal history of pulmonary embolism: Secondary | ICD-10-CM | POA: Diagnosis not present

## 2021-01-26 LAB — COMPREHENSIVE METABOLIC PANEL
ALT: 12 U/L (ref 0–44)
AST: 16 U/L (ref 15–41)
Albumin: 1.6 g/dL — ABNORMAL LOW (ref 3.5–5.0)
Alkaline Phosphatase: 73 U/L (ref 38–126)
Anion gap: 8 (ref 5–15)
BUN: 9 mg/dL (ref 6–20)
CO2: 27 mmol/L (ref 22–32)
Calcium: 8.4 mg/dL — ABNORMAL LOW (ref 8.9–10.3)
Chloride: 104 mmol/L (ref 98–111)
Creatinine, Ser: 1.2 mg/dL — ABNORMAL HIGH (ref 0.44–1.00)
GFR, Estimated: 58 mL/min — ABNORMAL LOW (ref >60)
Glucose, Bld: 284 mg/dL — ABNORMAL HIGH (ref 70–99)
Potassium: 3.3 mmol/L — ABNORMAL LOW (ref 3.5–5.1)
Sodium: 139 mmol/L (ref 135–145)
Total Bilirubin: 0.4 mg/dL (ref 0.3–1.2)
Total Protein: 4.8 g/dL — ABNORMAL LOW (ref 6.5–8.1)

## 2021-01-26 LAB — CBC
HCT: 29.7 % — ABNORMAL LOW (ref 36.0–46.0)
Hemoglobin: 9 g/dL — ABNORMAL LOW (ref 12.0–15.0)
MCH: 25.1 pg — ABNORMAL LOW (ref 26.0–34.0)
MCHC: 30.3 g/dL (ref 30.0–36.0)
MCV: 82.7 fL (ref 80.0–100.0)
Platelets: 274 10*3/uL (ref 150–400)
RBC: 3.59 MIL/uL — ABNORMAL LOW (ref 3.87–5.11)
RDW: 15.9 % — ABNORMAL HIGH (ref 11.5–15.5)
WBC: 9.8 10*3/uL (ref 4.0–10.5)
nRBC: 0 % (ref 0.0–0.2)

## 2021-01-26 LAB — LIPID PANEL
Cholesterol: 374 mg/dL — ABNORMAL HIGH (ref 0–200)
HDL: 50 mg/dL (ref >40)
LDL Cholesterol: 281 mg/dL — ABNORMAL HIGH (ref 0–99)
Total CHOL/HDL Ratio: 7.5 RATIO
Triglycerides: 213 mg/dL — ABNORMAL HIGH (ref <150)
VLDL: 43 mg/dL — ABNORMAL HIGH (ref 0–40)

## 2021-01-26 LAB — GLUCOSE, CAPILLARY
Glucose-Capillary: 242 mg/dL — ABNORMAL HIGH (ref 70–99)
Glucose-Capillary: 279 mg/dL — ABNORMAL HIGH (ref 70–99)
Glucose-Capillary: 331 mg/dL — ABNORMAL HIGH (ref 70–99)
Glucose-Capillary: 141 mg/dL — ABNORMAL HIGH (ref 70–99)

## 2021-01-26 LAB — ECHOCARDIOGRAM COMPLETE BUBBLE STUDY
AR max vel: 2.22 cm2
AV Area VTI: 2.31 cm2
AV Area mean vel: 2.13 cm2
AV Mean grad: 6 mmHg
AV Peak grad: 10.5 mmHg
Ao pk vel: 1.62 m/s
Area-P 1/2: 3.74 cm2
S' Lateral: 3.3 cm

## 2021-01-26 LAB — APTT: aPTT: 57 seconds — ABNORMAL HIGH (ref 24–36)

## 2021-01-26 LAB — HEMOGLOBIN A1C
Hgb A1c MFr Bld: 8.9 % — ABNORMAL HIGH (ref 4.8–5.6)
Mean Plasma Glucose: 208.73 mg/dL

## 2021-01-26 LAB — HEPARIN LEVEL (UNFRACTIONATED): Heparin Unfractionated: 0.24 IU/mL — ABNORMAL LOW (ref 0.30–0.70)

## 2021-01-26 IMAGING — MR MR HEAD W/O CM
12 of 13 series · 44 of 48 positions shown · non-contrast
Comparison: Prior head CT from [DATE].

CLINICAL DATA: Follow-up examination for acute stroke.

EXAM:
MRI HEAD WITHOUT CONTRAST
MRA HEAD WITHOUT CONTRAST
TECHNIQUE: Multiplanar, multiecho pulse sequences of the brain and surrounding
structures were obtained without intravenous contrast. Angiographic
images of the head were obtained using MRA technique without
contrast.

[Series 5: DWI · axial · 3.0mm · 0.88mm/px · z∈[-94,+47]mm · 7 of 96 slices shown (1 of 4)]
[im 1/96]
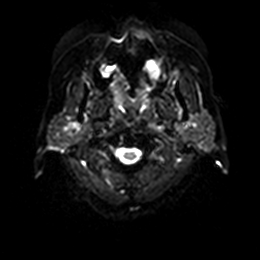
[im 16/96]
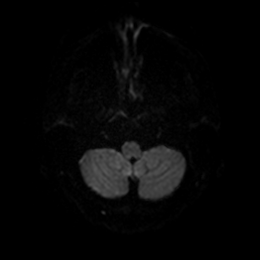
[im 32/96]
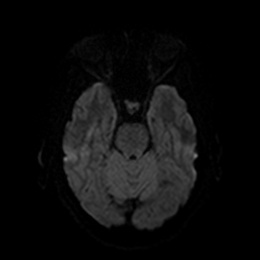
[im 48/96]
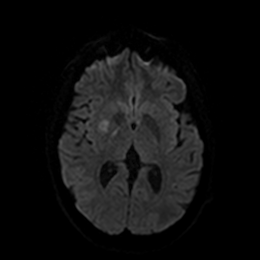
[im 64/96]
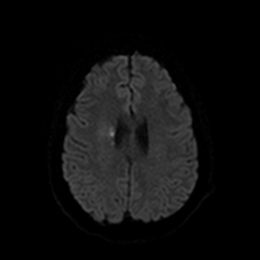
[im 80/96]
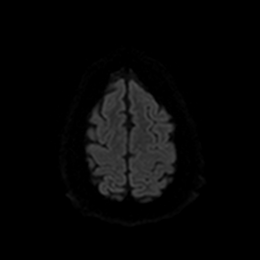
[im 96/96]
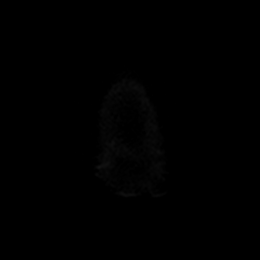

[Series 6: DWI · axial · 3.0mm · 0.88mm/px · z∈[-94,+47]mm · 4 of 48 slices shown (2 of 4)]
[im 1/48]
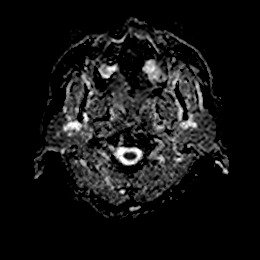
[im 16/48]
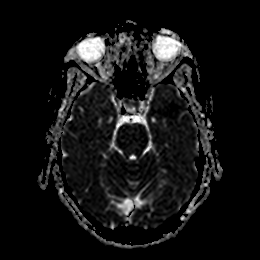
[im 32/48]
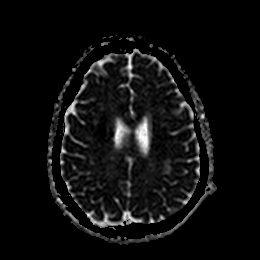
[im 48/48]
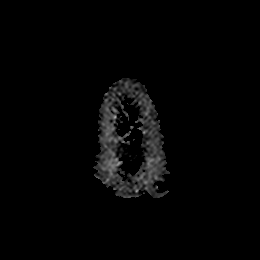

[Series 7: T1 · sagittal · 5.0mm · 0.75mm/px · 2 of 25 slices shown]
[im 1/25]
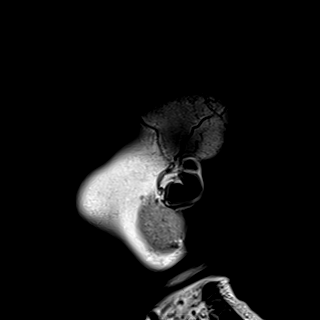
[im 25/25]
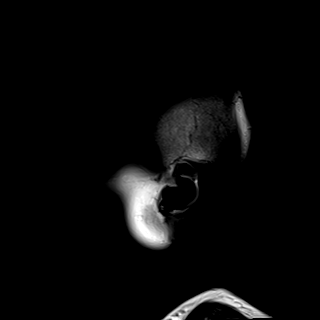

[Series 8: DWI · coronal · 4.0mm · 0.88mm/px · 6 of 70 slices shown (3 of 4)]
[im 1/70]
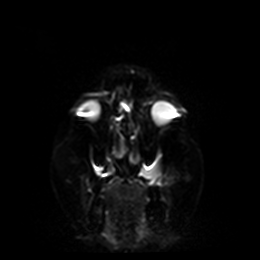
[im 14/70]
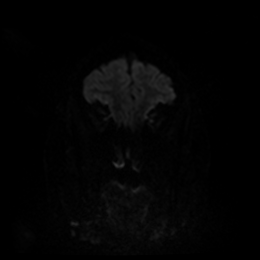
[im 28/70]
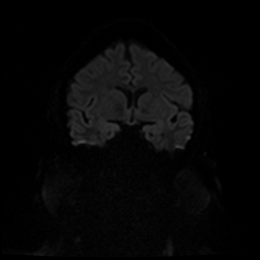
[im 42/70]
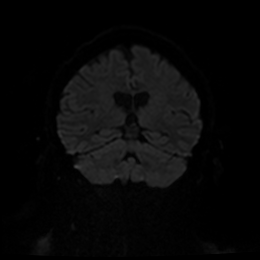
[im 56/70]
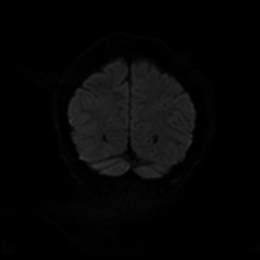
[im 70/70]
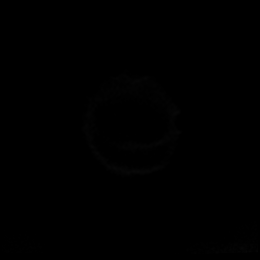

[Series 9: DWI · coronal · 4.0mm · 0.88mm/px · 3 of 35 slices shown (4 of 4)]
[im 1/35]
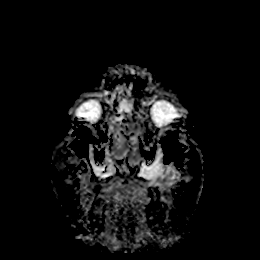
[im 18/35]
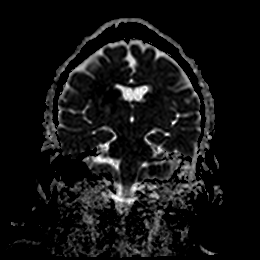
[im 35/35]
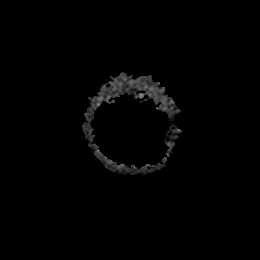

[Series 10: T2 · axial · 5.0mm · 0.72mm/px · z∈[-95,+49]mm · 2 of 25 slices shown (1 of 2)]
[im 1/25]
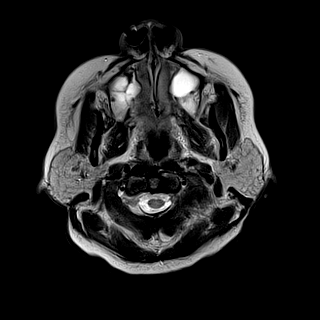
[im 25/25]
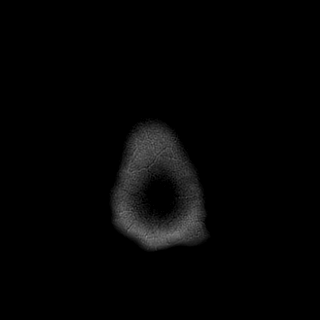

[Series 11: FLAIR · axial · 5.0mm · 0.45mm/px · z∈[-95,+49]mm · 2 of 25 slices shown]
[im 1/25]
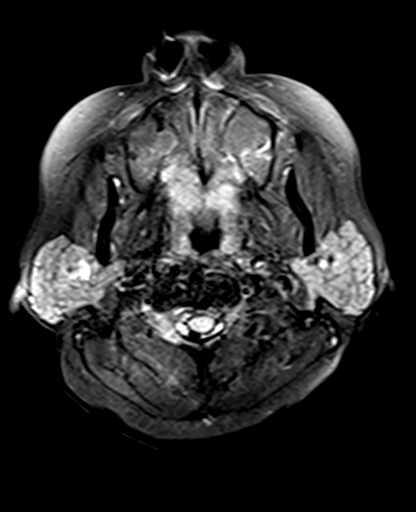
[im 25/25]
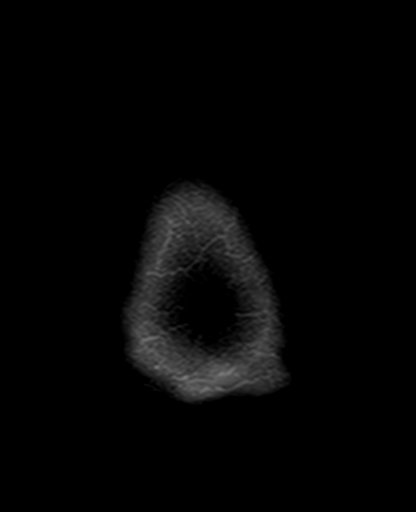

[Series 12: mag_images · axial · 3.0mm · 0.90mm/px · z∈[-100,+53]mm · 4 of 52 slices shown]
[im 1/52]
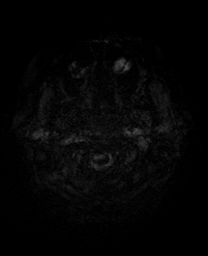
[im 18/52]
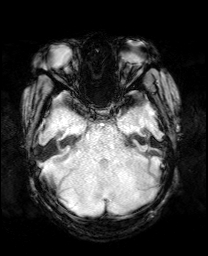
[im 35/52]
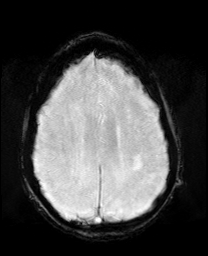
[im 52/52]
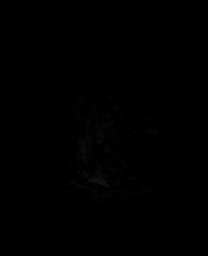

[Series 13: pha_images · axial · 3.0mm · 0.90mm/px · z∈[-100,+53]mm · 4 of 51 slices shown]
[im 1/51]
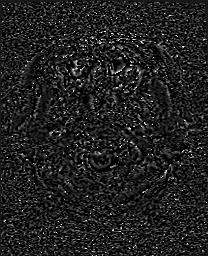
[im 17/51]
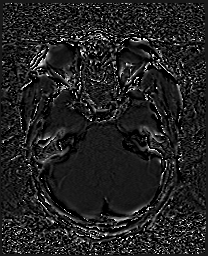
[im 34/51]
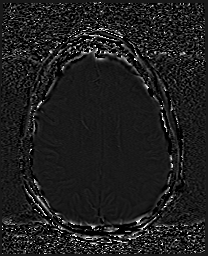
[im 51/51]
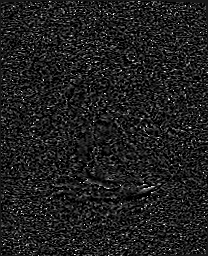

[Series 14: swi_images · axial · 3.0mm · 0.90mm/px · z∈[-100,+53]mm · 4 of 52 slices shown]
[im 1/52]
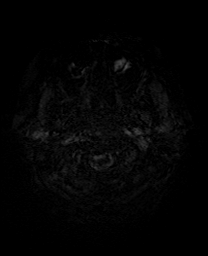
[im 18/52]
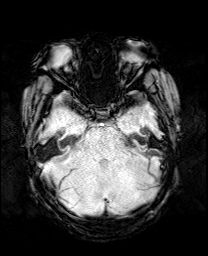
[im 35/52]
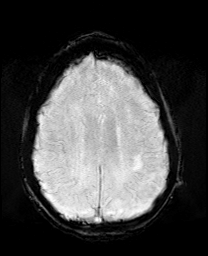
[im 52/52]
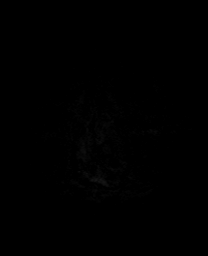

[Series 15: mip_images(sw) · axial · 24.0mm · 0.90mm/px · z∈[-89,+43]mm · 4 of 45 slices shown]
[im 1/45]
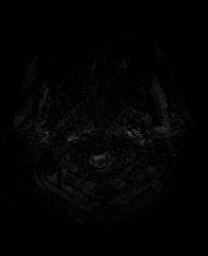
[im 15/45]
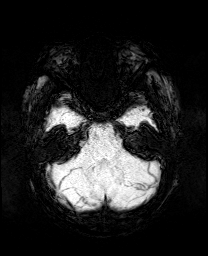
[im 30/45]
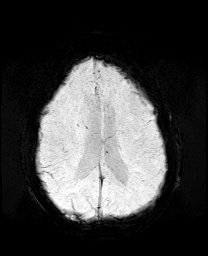
[im 45/45]
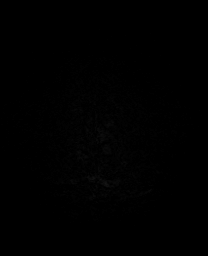

[Series 17: T2 · coronal · 5.0mm · 0.34mm/px · 2 of 30 slices shown (2 of 2)]
[im 1/30]
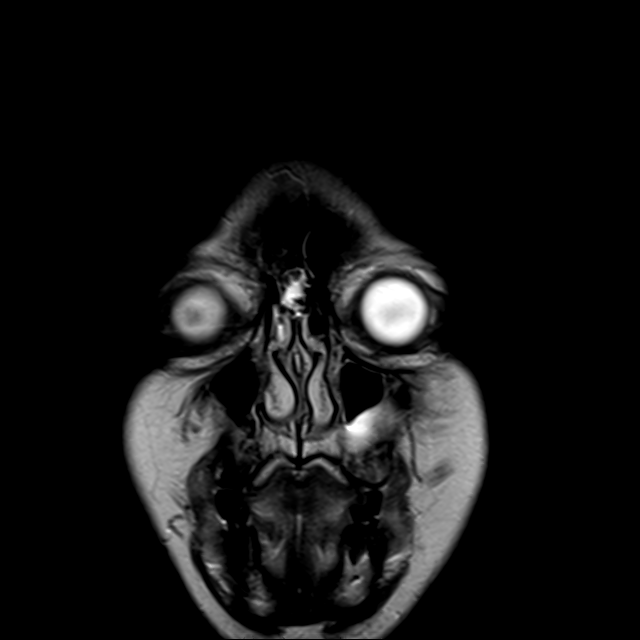
[im 30/30]
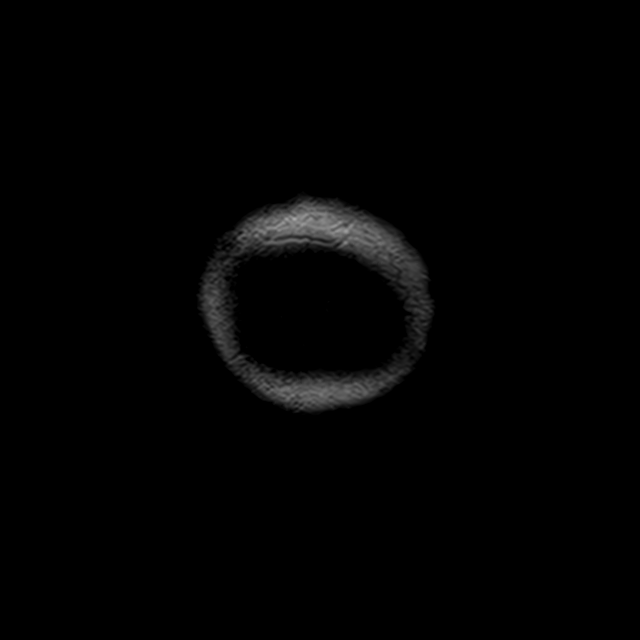

[44 of 48 positions shown; findings below may reference images not displayed]

FINDINGS: MRI HEAD FINDINGS

Brain: Cerebral volume within normal limits. Scattered T2/FLAIR
hyperintensities involving the periventricular, deep, and
subcortical white matter both cerebral hemispheres, most likely
related chronic microvascular ischemic disease, moderate in nature
for age.

2.6 cm focus of restricted diffusion involving the right basal
ganglia consistent with an acute to early subacute ischemic infarct.
Mild localized edema without significant regional mass effect.
Associated mild petechial hemorrhage without frank hemorrhagic
transformation. Partial extension into the right cerebral peduncle
and midbrain (series 5, image 68).

No other acute or subacute ischemia. Gray-white matter
differentiation otherwise maintained. No other areas of remote
cortical infarction. No other evidence for acute or chronic
intracranial hemorrhage. No mass lesion, midline shift or mass
effect. No extra-axial fluid collection. Pituitary gland suprasellar
region within normal limits. Midline structures intact.

Vascular: Major intracranial vascular flow voids are maintained.

Skull and upper cervical spine: Craniocervical junction within
normal limits. Bone marrow signal intensity normal. No focal marrow
replacing lesion. No scalp soft tissue abnormality.

Sinuses/Orbits: Globes and orbital soft tissues demonstrate no acute
finding. Scattered mucosal thickening noted throughout the
frontoethmoidal and maxillary sinuses. Left mastoid effusion noted,
of doubtful significance in the acute setting. Inner ear structures
grossly normal.

Other: None.

MRA HEAD FINDINGS

ANTERIOR CIRCULATION:

Examination degraded by motion artifact.

Distal cervical segments of the internal carotid arteries are patent
with antegrade flow. Distal cervical left ICA tortuous. Petrous,
cavernous, and supraclinoid segments patent without definite
stenosis or other abnormality. A1 segments patent bilaterally.
Normal anterior communicating artery complex. Anterior cerebral
arteries patent to their distal aspects without stenosis. No visible
M1 stenosis or occlusion. Negative MCA bifurcations. Distal MCA
branches well perfused and symmetric.

POSTERIOR CIRCULATION:

Visualized distal cervical segments of the internal carotid arteries
are patent with antegrade flow. Left vertebral artery slightly
dominant. Left PICA origin patent and normal. Right PICA not
definitely seen. Basilar patent to its distal aspect without
appreciable stenosis. Superior cerebellar arteries patent
bilaterally. Both PCA supplied via the basilar as well as small
bilateral posterior communicating arteries. PCAs perfused to their
distal aspects without appreciable stenosis.

No intracranial aneurysm on this motion degraded exam.
IMPRESSION: MRI HEAD IMPRESSION:

1. 2.6 cm acute to early subacute ischemic infarct involving the
right basal ganglia. Associated mild petechial hemorrhage without
frank hemorrhagic transformation.
2. Underlying moderate chronic microvascular ischemic disease.

MRA HEAD IMPRESSION:

1. Motion degraded exam.
2. Negative intracranial MRA. No large vessel occlusion,
hemodynamically significant stenosis, or other acute vascular
abnormality.

## 2021-01-26 IMAGING — MR MR MRA HEAD W/O CM
1 series · 17 of 48 positions shown · non-contrast
Comparison: Prior head CT from [DATE].

CLINICAL DATA: Follow-up examination for acute stroke.

EXAM:
MRI HEAD WITHOUT CONTRAST
MRA HEAD WITHOUT CONTRAST
TECHNIQUE: Multiplanar, multiecho pulse sequences of the brain and surrounding
structures were obtained without intravenous contrast. Angiographic
images of the head were obtained using MRA technique without
contrast.

[Series 5: 3d cow · axial · 0.5mm · 0.41mm/px · z∈[-86,-3]mm · 17 of 176 slices shown]
[im 1/176]
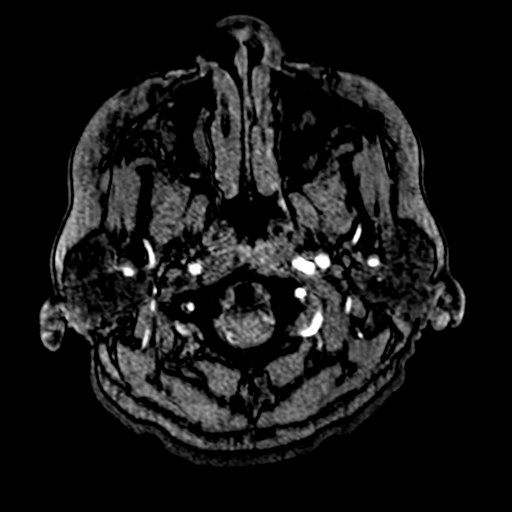
[im 4/176]
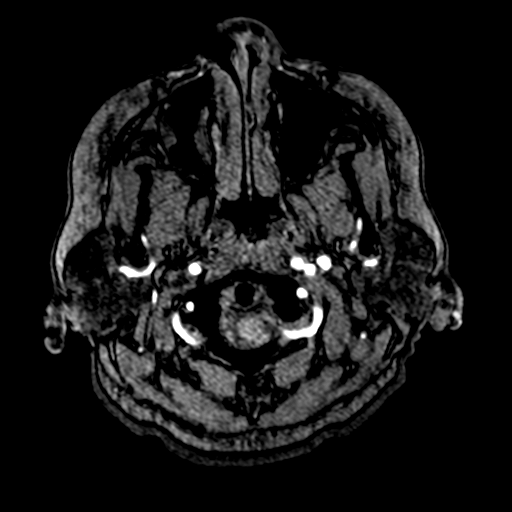
[im 8/176]
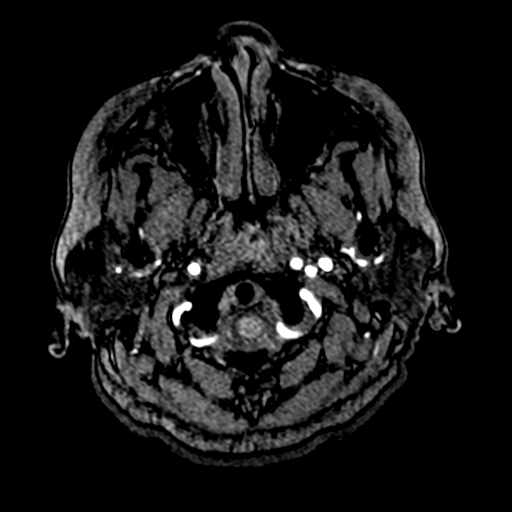
[im 12/176]
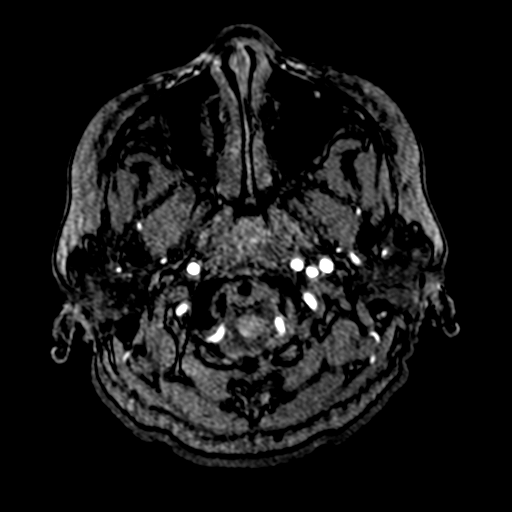
[im 15/176]
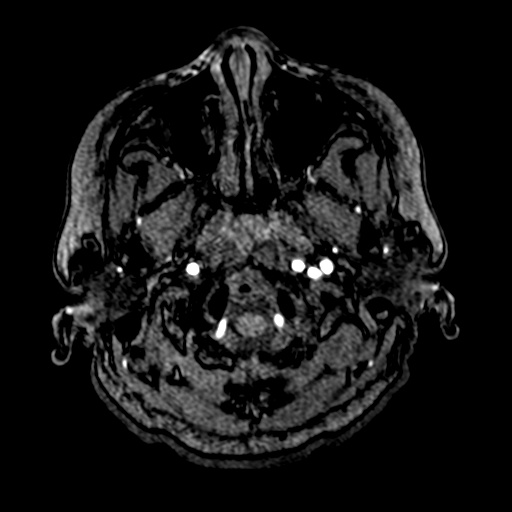
[im 19/176]
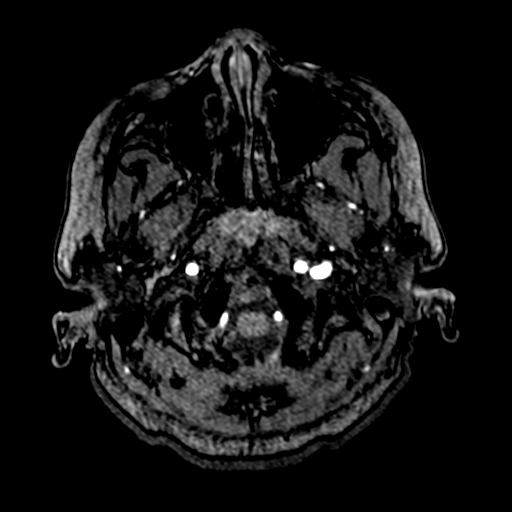
[im 23/176]
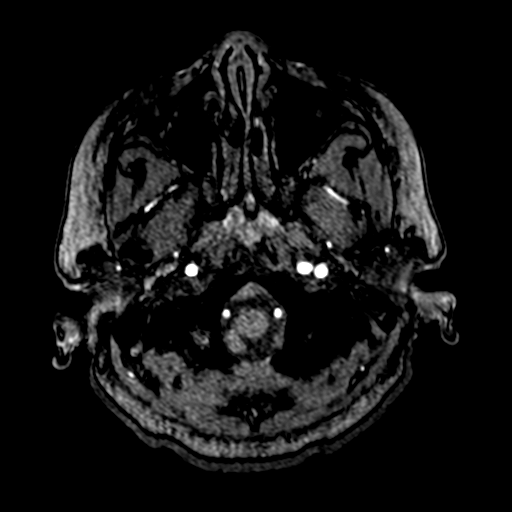
[im 30/176]
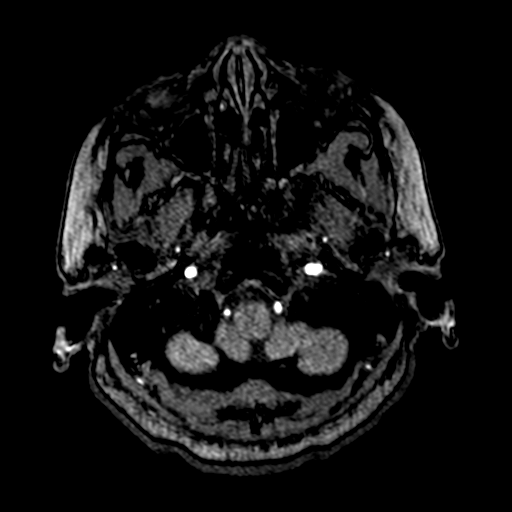
[im 34/176]
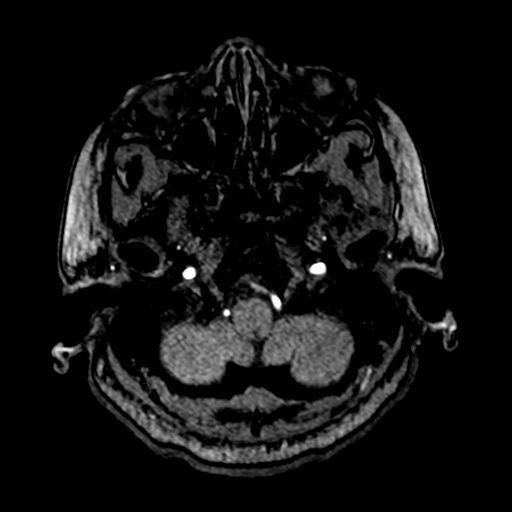
[im 56/176]
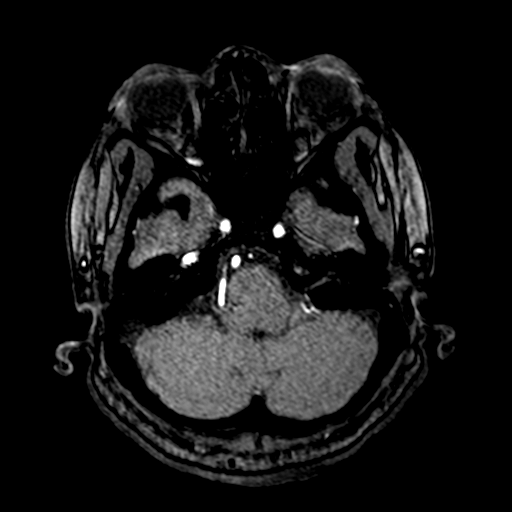
[im 79/176]
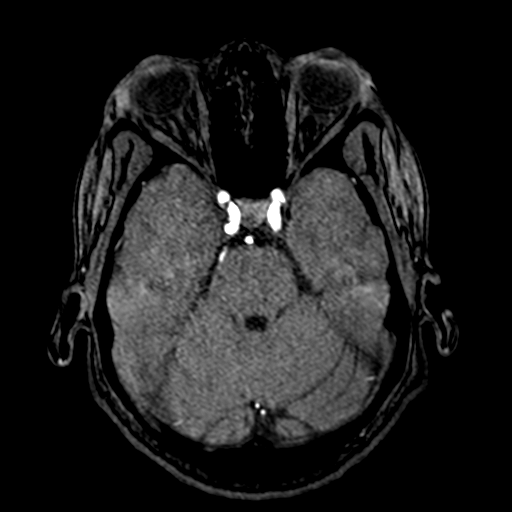
[im 90/176]
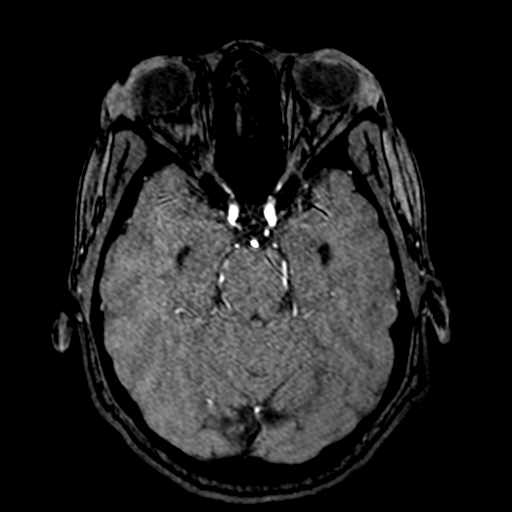
[im 101/176]
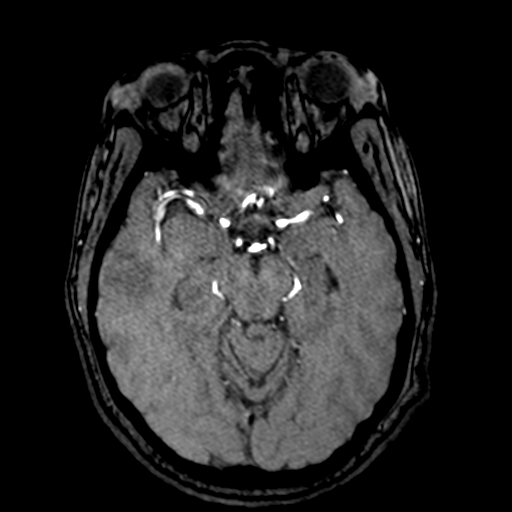
[im 123/176]
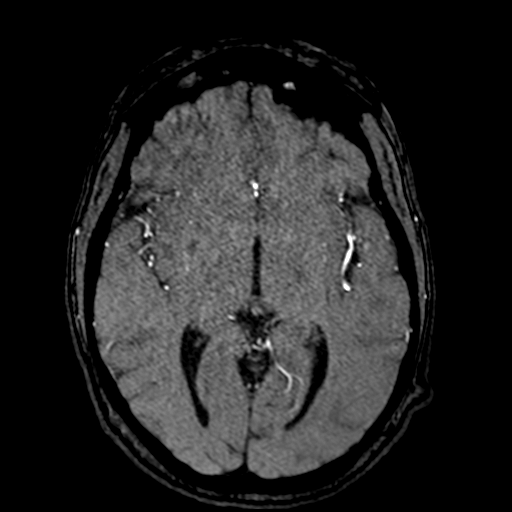
[im 146/176]
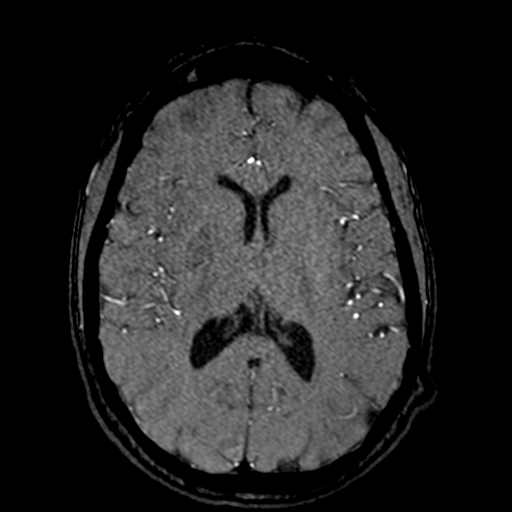
[im 149/176]
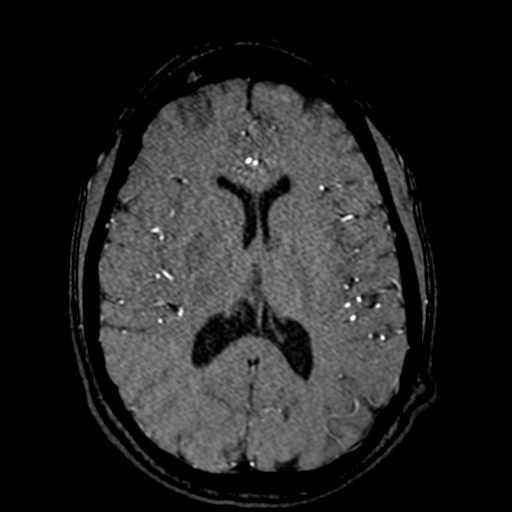
[im 168/176]
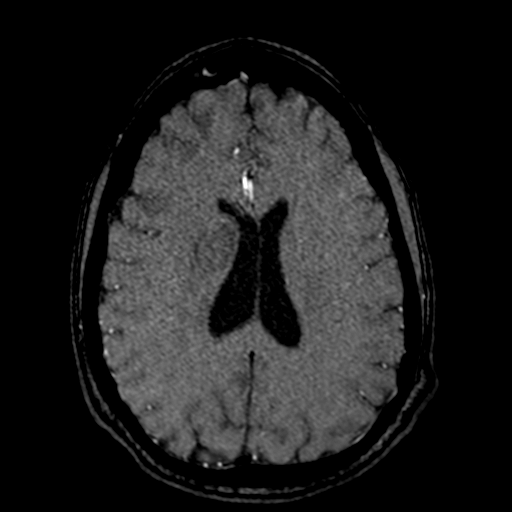

[17 of 48 positions shown; findings below may reference images not displayed]

FINDINGS: MRI HEAD FINDINGS

Brain: Cerebral volume within normal limits. Scattered T2/FLAIR
hyperintensities involving the periventricular, deep, and
subcortical white matter both cerebral hemispheres, most likely
related chronic microvascular ischemic disease, moderate in nature
for age.

2.6 cm focus of restricted diffusion involving the right basal
ganglia consistent with an acute to early subacute ischemic infarct.
Mild localized edema without significant regional mass effect.
Associated mild petechial hemorrhage without frank hemorrhagic
transformation. Partial extension into the right cerebral peduncle
and midbrain (series 5, image 68).

No other acute or subacute ischemia. Gray-white matter
differentiation otherwise maintained. No other areas of remote
cortical infarction. No other evidence for acute or chronic
intracranial hemorrhage. No mass lesion, midline shift or mass
effect. No extra-axial fluid collection. Pituitary gland suprasellar
region within normal limits. Midline structures intact.

Vascular: Major intracranial vascular flow voids are maintained.

Skull and upper cervical spine: Craniocervical junction within
normal limits. Bone marrow signal intensity normal. No focal marrow
replacing lesion. No scalp soft tissue abnormality.

Sinuses/Orbits: Globes and orbital soft tissues demonstrate no acute
finding. Scattered mucosal thickening noted throughout the
frontoethmoidal and maxillary sinuses. Left mastoid effusion noted,
of doubtful significance in the acute setting. Inner ear structures
grossly normal.

Other: None.

MRA HEAD FINDINGS

ANTERIOR CIRCULATION:

Examination degraded by motion artifact.

Distal cervical segments of the internal carotid arteries are patent
with antegrade flow. Distal cervical left ICA tortuous. Petrous,
cavernous, and supraclinoid segments patent without definite
stenosis or other abnormality. A1 segments patent bilaterally.
Normal anterior communicating artery complex. Anterior cerebral
arteries patent to their distal aspects without stenosis. No visible
M1 stenosis or occlusion. Negative MCA bifurcations. Distal MCA
branches well perfused and symmetric.

POSTERIOR CIRCULATION:

Visualized distal cervical segments of the internal carotid arteries
are patent with antegrade flow. Left vertebral artery slightly
dominant. Left PICA origin patent and normal. Right PICA not
definitely seen. Basilar patent to its distal aspect without
appreciable stenosis. Superior cerebellar arteries patent
bilaterally. Both PCA supplied via the basilar as well as small
bilateral posterior communicating arteries. PCAs perfused to their
distal aspects without appreciable stenosis.

No intracranial aneurysm on this motion degraded exam.
IMPRESSION: MRI HEAD IMPRESSION:

1. 2.6 cm acute to early subacute ischemic infarct involving the
right basal ganglia. Associated mild petechial hemorrhage without
frank hemorrhagic transformation.
2. Underlying moderate chronic microvascular ischemic disease.

MRA HEAD IMPRESSION:

1. Motion degraded exam.
2. Negative intracranial MRA. No large vessel occlusion,
hemodynamically significant stenosis, or other acute vascular
abnormality.

## 2021-01-26 MED ORDER — INSULIN GLARGINE 100 UNIT/ML ~~LOC~~ SOLN
25.0000 [IU] | Freq: Two times a day (BID) | SUBCUTANEOUS | Status: DC
  Administered 2021-01-26 – 2021-01-28 (×5): 25 [IU] via SUBCUTANEOUS
  Filled 2021-01-26 (×6): qty 0.25

## 2021-01-26 MED ORDER — SIMVASTATIN 20 MG PO TABS
20.0000 mg | ORAL_TABLET | Freq: Every day | ORAL | Status: DC
  Administered 2021-01-26: 20 mg via ORAL
  Filled 2021-01-26: qty 1

## 2021-01-26 MED ORDER — HYDRALAZINE HCL 20 MG/ML IJ SOLN
10.0000 mg | Freq: Four times a day (QID) | INTRAMUSCULAR | Status: DC | PRN
  Administered 2021-01-26 – 2021-01-27 (×2): 10 mg via INTRAVENOUS
  Filled 2021-01-26 (×2): qty 1

## 2021-01-26 MED ORDER — INSULIN ASPART 100 UNIT/ML IJ SOLN
0.0000 [IU] | Freq: Three times a day (TID) | INTRAMUSCULAR | Status: DC
  Administered 2021-01-26: 11 [IU] via SUBCUTANEOUS
  Administered 2021-01-26: 8 [IU] via SUBCUTANEOUS
  Administered 2021-01-27: 3 [IU] via SUBCUTANEOUS

## 2021-01-26 MED ORDER — ROSUVASTATIN CALCIUM 20 MG PO TABS
20.0000 mg | ORAL_TABLET | Freq: Every day | ORAL | Status: DC
  Administered 2021-01-26: 20 mg via ORAL
  Filled 2021-01-26: qty 1

## 2021-01-26 MED ORDER — CARVEDILOL 6.25 MG PO TABS
6.2500 mg | ORAL_TABLET | Freq: Two times a day (BID) | ORAL | Status: DC
  Administered 2021-01-26 – 2021-01-28 (×4): 6.25 mg via ORAL
  Filled 2021-01-26 (×4): qty 1

## 2021-01-26 MED ORDER — LIVING WELL WITH DIABETES BOOK
Freq: Once | Status: AC
  Filled 2021-01-26: qty 1

## 2021-01-26 MED ORDER — HEPARIN (PORCINE) 25000 UT/250ML-% IV SOLN
1600.0000 [IU]/h | INTRAVENOUS | Status: DC
  Administered 2021-01-26: 1400 [IU]/h via INTRAVENOUS
  Administered 2021-01-28: 1600 [IU]/h via INTRAVENOUS
  Filled 2021-01-26 (×3): qty 250

## 2021-01-26 MED ORDER — ROSUVASTATIN CALCIUM 20 MG PO TABS
40.0000 mg | ORAL_TABLET | Freq: Every day | ORAL | Status: DC
  Administered 2021-01-27 – 2021-01-28 (×2): 40 mg via ORAL
  Filled 2021-01-26 (×2): qty 2

## 2021-01-26 MED ORDER — INSULIN ASPART 100 UNIT/ML IJ SOLN: 0.0000 [IU] | Freq: Every day | INTRAMUSCULAR | Status: DC

## 2021-01-26 NOTE — Progress Notes (Signed)
ANTICOAGULATION CONSULT NOTE - Initial Consult  Pharmacy Consult for heparin Indication: h/o PE  Allergies  Allergen Reactions  . Amlodipine Swelling  . Humalog Kwikpen [Insulin Lispro] Other (See Comments)    75/50---swelling and ulcers in mouth     Patient Measurements: Height: 5\' 5"  (165.1 cm) Weight: (!) 152 kg (335 lb) IBW/kg (Calculated) : 57 Heparin Dosing Weight: 95.5 kg  Vital Signs: Temp: 98.3 F (36.8 C) (04/30 0348) Temp Source: Oral (04/30 0348) BP: 162/83 (04/30 0852) Pulse Rate: 85 (04/30 0852)  Labs: Recent Labs    01/25/21 1457 01/25/21 1804 01/25/21 2113 01/26/21 0332  HGB 10.6*  --  9.3* 9.0*  HCT 34.2*  --  30.7* 29.7*  PLT 329  --  260 274  APTT  --  28  --   --   LABPROT  --  12.7  --   --   INR  --  1.0  --   --   CREATININE 1.10*  --  1.12* 1.20*    Estimated Creatinine Clearance: 92.5 mL/min (A) (by C-G formula based on SCr of 1.2 mg/dL (H)).   Medical History: Past Medical History:  Diagnosis Date  . Anemia   . Asthma   . CHF (congestive heart failure) (HCC)    Pt reports that father has CHF, not her  . Diabetes mellitus without complication ([place])   . Dyslipidemia   . GERD (gastroesophageal reflux disease)   . Pulmonary embolism [facility])     Assessment: 40 yo female admitted for work-up suggesting right basal ganglia moderate to large sized ischemic CVA. Patient on xarelto for h/o PE, but has not taken in last week due to heavy menstrual periods. Pharmacy consulted to start heparin drip with no boluses. Lovenox given last PM x 1. Due to stroke work-up will also target lower end of therapeutic range (0.3-0.5).   As patient has been off xarelto for a week, will likely not need to order APTT to monitor, but will get one initially just to make sure.     Goal of Therapy:  Heparin level 0.3-0.5 Monitor platelets by anticoagulation protocol: Yes   Plan:  Heparin drip at 1400 units/hr, no boluses Heparin level and APTT in 6  hours Daily Heparin level and CBC  Asani Deniston A. Levada Dy, PharmD, BCPS, FNKF Clinical Pharmacist [place] Please utilize Amion for appropriate phone number to reach the unit pharmacist ([place])    Theotis Burrow 01/26/2021,9:50 AM

## 2021-01-26 NOTE — Progress Notes (Signed)
PROGRESS NOTE                                                                                                                                                                                                             Patient Demographics:    Sherri Dixon, is a 40 y.o. female, DOB - [birth year], DZH:[id number]  Outpatient Primary MD for the patient is Tomasa Hose, NP    LOS - 0  Admit date - 01/25/2021    Chief Complaint  Patient presents with  . Headache  . Emesis       Brief Narrative (HPI from H&P)  - Sherri Dixon is a 40 y.o. female with history of hypertension, diabetes mellitus, CHF, asthma, history of PE who was recently admitted and discharged for CHF on December 15, 2020 last month presents to the ER with complaints of left-sided headache and left facial droop.  Patient has been having the symptoms for last 3 days and the left facial droop was noticed by patient's husband, she presented to the ER with very high blood pressure, work-up suggesting right basal ganglia moderate to large sized ischemic CVA, of note patient is on Xarelto for PEs but had stopped taking it for a week due to heavy menstrual periods.   Subjective:    Sherri Dixon today has, No headache, No chest pain, No abdominal pain - No Nausea, No new weakness tingling or numbness but does have left-sided weakness and facial droop, no SOB.   Assessment  & Plan :     1. Acute right basal ganglia ischemic CVA.  Causing left-sided facial droop and weakness, case discussed with neurologist Dr. Beatriz Chancellor, symptoms seem to be at least 42 to 39 days old, gradually controlled blood pressure now, placed on high intensity statin as LDL is above goal, better glycemic control as A1c is above goal.  Protocol, may require placement depending on her progress.  Of note she is on Xarelto at home which she was not taking for a week, this is for PE, due to her size of acute CVA  has decided that patient will be started on nonbullous heparin drip on 01/26/2021 with close monitoring.     2.  History of PE.  See discussion above with anticoagulation.  Eventually resume Xarelto with better compliance upon discharge.  3.  Morbid obesity.  BMI 55.  Follow with PCP for weight loss  4.  Dyslipidemia.  LDL above goal placed on high intensity statin.  5.  Hypertension.  Gradually controlled placed on Coreg twice daily for now.  6.  Chronic microcytic anemia.  Follow with PCP no acute issues likely due to heavy menstrual periods.  Defer to PCP.  7.  DM type II.  Poor control outpatient due to hyperglycemia, on Lantus and sliding scale will monitor & adjust, DM and insulin education.  Lab Results  Component Value Date   HGBA1C 8.9 (H) 01/26/2021   CBG (last 3)  Recent Labs    01/25/21 2117 01/26/21 0612  GLUCAP 256* 242*         Condition -  Guarded  Family Communication  : Husband Mr. Amilli Chiodi [phone] on 01/26/2021 message left at 06/28/1971  Code Status :  Full  Consults  :  Neuro  PUD Prophylaxis : None   Procedures  :     MRI/A - : MRI HEAD IMPRESSION: 1. 2.6 cm acute to early subacute ischemic infarct involving the right basal ganglia. Associated mild petechial hemorrhage without frank hemorrhagic transformation. 2. Underlying moderate chronic microvascular ischemic disease. MRA HEAD IMPRESSION: 1. Motion degraded exam. 2. Negative intracranial MRA. No large vessel occlusion, hemodynamically significant stenosis, or other acute vascular abnormality.   TTE      Disposition Plan  :    Status is: Observation  Dispo: The patient is from: Home              Anticipated d/c is to: TBD              Patient currently is not medically stable to d/c.   Difficult to place patient No  DVT Prophylaxis  :    Hep gtt    Lab Results  Component Value Date   PLT 274 01/26/2021    Diet :  Diet Order            Diet Carb Modified Fluid  consistency: Thin; Room service appropriate? Yes  Diet effective now                  Inpatient Medications  Scheduled Meds: . aspirin  300 mg Rectal Daily   Or  . aspirin  325 mg Oral Daily  . enoxaparin (LOVENOX) injection  40 mg Subcutaneous Q24H  . insulin aspart  0-9 Units Subcutaneous TID WC  . simvastatin  20 mg Oral QHS   Continuous Infusions: PRN Meds:.acetaminophen **OR** acetaminophen (TYLENOL) oral liquid 160 mg/5 mL **OR** acetaminophen, labetalol  Antibiotics  :    Anti-infectives (From admission, onward)   None       Time Spent in minutes  30   Susa Raring M.D on 01/26/2021 at 9:26 AM  To page go to [email or web address]   [facility] -  Office  [phone]    See all Orders from today for further details    Objective:   Vitals:   01/26/21 0319 01/26/21 0348 01/26/21 0412 01/26/21 0852  BP: (!) 192/84 (!) 174/83 (!) 159/78 (!) 162/83  Pulse: 89 83 84 85  Resp:  19  18  Temp: 97.9 F (36.6 C) 98.3 F (36.8 C)    TempSrc: Oral Oral    SpO2: 100% 100% 98% 100%  Weight:      Height:        Wt Readings from Last 3 Encounters:  01/25/21 (!) 152 kg  12/15/20 (!) 151.1 kg  07/28/19 122.4 kg    No intake or output data in the 24 hours ending 01/26/21 [id number]   Physical Exam  Awake Alert, No new F.N deficits, does have left-sided weakness and facial droop [place].AT,PERRAL Supple Neck,No JVD, No cervical lymphadenopathy appriciated.  Symmetrical Chest wall movement, Good air movement bilaterally, CTAB RRR,No Gallops,Rubs or new Murmurs, No Parasternal Heave +ve B.Sounds, Abd Soft, No tenderness, No organomegaly appriciated, No rebound - guarding or rigidity. No Cyanosis, Clubbing or edema, No new Rash or bruise       Data Review:    CBC Recent Labs  Lab 01/25/21 1457 01/25/21 2113 01/26/21 0332  WBC 11.5* 9.2 9.8  HGB 10.6* 9.3* 9.0*  HCT 34.2* 30.7* 29.7*  PLT 329 260 274  MCV 82.4 82.1 82.7  MCH 25.5* 24.9* 25.1*  MCHC 31.0  30.3 30.3  RDW 15.9* 15.9* 15.9*  LYMPHSABS 3.2  --   --   MONOABS 0.7  --   --   EOSABS 0.2  --   --   BASOSABS 0.1  --   --     Recent Labs  Lab 01/25/21 1457 01/25/21 1804 01/25/21 2113 01/26/21 0332  NA 136  --   --  139  K 3.3*  --   --  3.3*  CL 106  --   --  104  CO2 24  --   --  27  GLUCOSE 272*  --   --  284*  BUN 12  --   --  9  CREATININE 1.10*  --  1.12* 1.20*  CALCIUM 8.6*  --   --  8.4*  AST  --   --   --  16  ALT  --   --   --  12  ALKPHOS  --   --   --  73  BILITOT  --   --   --  0.4  ALBUMIN  --   --   --  1.6*  INR  --  1.0  --   --   HGBA1C  --   --   --  8.9*    ------------------------------------------------------------------------------------------------------------------ Recent Labs    01/26/21 0332  CHOL 374*  HDL 50  LDLCALC 281*  TRIG 213*  CHOLHDL 7.5    Lab Results  Component Value Date   HGBA1C 8.9 (H) 01/26/2021   ------------------------------------------------------------------------------------------------------------------ No results for input(s): TSH, T4TOTAL, T3FREE, THYROIDAB in the last 72 hours.  Invalid input(s): FREET3  Cardiac Enzymes No results for input(s): CKMB, TROPONINI, MYOGLOBIN in the last 168 hours.  Invalid input(s): CK ------------------------------------------------------------------------------------------------------------------    Component Value Date/Time   BNP 294.8 (H) 12/12/2020 1823    Micro Results Recent Results (from the past 240 hour(s))  Resp Panel by RT-PCR (Flu A&B, Covid) Nasopharyngeal Swab     Status: None   Collection Time: 01/25/21  6:38 PM   Specimen: Nasopharyngeal Swab; Nasopharyngeal(NP) swabs in vial transport medium  Result Value Ref Range Status   SARS Coronavirus 2 by RT PCR NEGATIVE NEGATIVE Final    Comment: (NOTE) SARS-CoV-2 target nucleic acids are NOT DETECTED.  The SARS-CoV-2 RNA is generally detectable in upper respiratory specimens during the acute phase  of infection. The lowest concentration of SARS-CoV-2 viral copies this assay can detect is 138 copies/mL. A negative result does not preclude SARS-Cov-2 infection and should not be used as the sole basis for treatment or other patient management decisions. A negative result may occur with  improper specimen collection/handling, submission  of specimen other than nasopharyngeal swab, presence of viral mutation(s) within the areas targeted by this assay, and inadequate number of viral copies(<138 copies/mL). A negative result must be combined with clinical observations, patient history, and epidemiological information. The expected result is Negative.  Fact Sheet for Patients:  EntrepreneurPulse.com.au  Fact Sheet for Healthcare Providers:  IncredibleEmployment.be  This test is no t yet approved or cleared by the [place] FDA and  has been authorized for detection and/or diagnosis of SARS-CoV-2 by FDA under an Emergency Use Authorization (EUA). This EUA will remain  in effect (meaning this test can be used) for the duration of the COVID-19 declaration under Section 564(b)(1) of the Act, 21 U.S.C.section 360bbb-3(b)(1), unless the authorization is terminated  or revoked sooner.       Influenza A by PCR NEGATIVE NEGATIVE Final   Influenza B by PCR NEGATIVE NEGATIVE Final    Comment: (NOTE) The Xpert Xpress SARS-CoV-2/FLU/RSV plus assay is intended as an aid in the diagnosis of influenza from Nasopharyngeal swab specimens and should not be used as a sole basis for treatment. Nasal washings and aspirates are unacceptable for Xpert Xpress SARS-CoV-2/FLU/RSV testing.  Fact Sheet for Patients: EntrepreneurPulse.com.au  Fact Sheet for Healthcare Providers: IncredibleEmployment.be  This test is not yet approved or cleared by the [place] FDA and has been authorized for detection and/or diagnosis of  SARS-CoV-2 by FDA under an Emergency Use Authorization (EUA). This EUA will remain in effect (meaning this test can be used) for the duration of the COVID-19 declaration under Section 564(b)(1) of the Act, 21 U.S.C. section 360bbb-3(b)(1), unless the authorization is terminated or revoked.  Performed at [facility], [place] [address], [place], [place] [address]     Radiology Reports CT Head Wo Contrast  Result Date: 01/25/2021 CLINICAL DATA:  Nausea, emesis, headache for 3 days EXAM: CT HEAD WITHOUT CONTRAST TECHNIQUE: Contiguous axial images were obtained from the base of the skull through the vertex without intravenous contrast. COMPARISON:  None. FINDINGS: Brain: Hypodensity centered in the right basal ganglia measuring approximately 3.2 x 1.9 cm most consistent with acute to subacute infarct. No significant mass effect. No evidence of acute hemorrhage. The lateral ventricles and remaining midline structures are unremarkable. There are no acute extra-axial fluid collections. Vascular: No hyperdense vessel or unexpected calcification. Skull: Normal. Negative for fracture or focal lesion. Sinuses/Orbits: Mild mucosal thickening within the frontal and right maxillary sinuses. Other: None. IMPRESSION: 1. 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 No evidence of hemorrhagic transformation or significant mass effect. Critical Value/emergent results were called by telephone at the time of interpretation on 01/25/2021 at 5:50 pm to provider Deno Etienne , who verbally acknowledged these results. Electronically Signed   By: Randa Ngo M.D.   On: 01/25/2021 17:53   MR ANGIO HEAD WO CONTRAST  Result Date: 01/26/2021 CLINICAL DATA:  Follow-up examination for acute stroke. EXAM: MRI HEAD WITHOUT CONTRAST MRA HEAD WITHOUT CONTRAST TECHNIQUE: Multiplanar, multiecho pulse sequences of the brain and surrounding structures were obtained without intravenous contrast.  Angiographic images of the head were obtained using MRA technique without contrast. COMPARISON:  Prior head CT from 01/25/2021. FINDINGS: MRI HEAD FINDINGS Brain: Cerebral volume within normal limits. Scattered T2/FLAIR hyperintensities involving the periventricular, deep, and subcortical white matter both cerebral hemispheres, most likely related chronic microvascular ischemic disease, moderate in nature for age. 2.6 cm focus of restricted diffusion involving the right basal ganglia consistent with an acute to early subacute ischemic  infarct. Mild localized edema without significant regional mass effect. Associated mild petechial hemorrhage without frank hemorrhagic transformation. Partial extension into the right cerebral peduncle and midbrain (series 5, image 68). No other acute or subacute ischemia. Gray-white matter differentiation otherwise maintained. No other areas of remote cortical infarction. No other evidence for acute or chronic intracranial hemorrhage. No mass lesion, midline shift or mass effect. No extra-axial fluid collection. Pituitary gland suprasellar region within normal limits. Midline structures intact. Vascular: Major intracranial vascular flow voids are maintained. Skull and upper cervical spine: Craniocervical junction within normal limits. Bone marrow signal intensity normal. No focal marrow replacing lesion. No scalp soft tissue abnormality. Sinuses/Orbits: Globes and orbital soft tissues demonstrate no acute finding. Scattered mucosal thickening noted throughout the frontoethmoidal and maxillary sinuses. Left mastoid effusion noted, of doubtful significance in the acute setting. Inner ear structures grossly normal. Other: None. MRA HEAD FINDINGS ANTERIOR CIRCULATION: Examination degraded by motion artifact. Distal cervical segments of the internal carotid arteries are patent with antegrade flow. Distal cervical left ICA tortuous. Petrous, cavernous, and supraclinoid segments patent  without definite stenosis or other abnormality. A1 segments patent bilaterally. Normal anterior communicating artery complex. Anterior cerebral arteries patent to their distal aspects without stenosis. No visible M1 stenosis or occlusion. Negative MCA bifurcations. Distal MCA branches well perfused and symmetric. POSTERIOR CIRCULATION: Visualized distal cervical segments of the internal carotid arteries are patent with antegrade flow. Left vertebral artery slightly dominant. Left PICA origin patent and normal. Right PICA not definitely seen. Basilar patent to its distal aspect without appreciable stenosis. Superior cerebellar arteries patent bilaterally. Both PCA supplied via the basilar as well as small bilateral posterior communicating arteries. PCAs perfused to their distal aspects without appreciable stenosis. No intracranial aneurysm on this motion degraded exam. IMPRESSION: MRI HEAD IMPRESSION: 1. 2.6 cm acute to early subacute ischemic infarct involving the right basal ganglia. Associated mild petechial hemorrhage without frank hemorrhagic transformation. 2. Underlying moderate chronic microvascular ischemic disease. MRA HEAD IMPRESSION: 1. Motion degraded exam. 2. Negative intracranial MRA. No large vessel occlusion, hemodynamically significant stenosis, or other acute vascular abnormality. Electronically Signed   By: Jeannine Boga M.D.   On: 01/26/2021 04:11   MR BRAIN WO CONTRAST  Result Date: 01/26/2021 CLINICAL DATA:  Follow-up examination for acute stroke. EXAM: MRI HEAD WITHOUT CONTRAST MRA HEAD WITHOUT CONTRAST TECHNIQUE: Multiplanar, multiecho pulse sequences of the brain and surrounding structures were obtained without intravenous contrast. Angiographic images of the head were obtained using MRA technique without contrast. COMPARISON:  Prior head CT from 01/25/2021. FINDINGS: MRI HEAD FINDINGS Brain: Cerebral volume within normal limits. Scattered T2/FLAIR hyperintensities involving the  periventricular, deep, and subcortical white matter both cerebral hemispheres, most likely related chronic microvascular ischemic disease, moderate in nature for age. 2.6 cm focus of restricted diffusion involving the right basal ganglia consistent with an acute to early subacute ischemic infarct. Mild localized edema without significant regional mass effect. Associated mild petechial hemorrhage without frank hemorrhagic transformation. Partial extension into the right cerebral peduncle and midbrain (series 5, image 68). No other acute or subacute ischemia. Gray-white matter differentiation otherwise maintained. No other areas of remote cortical infarction. No other evidence for acute or chronic intracranial hemorrhage. No mass lesion, midline shift or mass effect. No extra-axial fluid collection. Pituitary gland suprasellar region within normal limits. Midline structures intact. Vascular: Major intracranial vascular flow voids are maintained. Skull and upper cervical spine: Craniocervical junction within normal limits. Bone marrow signal intensity normal. No focal marrow replacing lesion. No scalp  soft tissue abnormality. Sinuses/Orbits: Globes and orbital soft tissues demonstrate no acute finding. Scattered mucosal thickening noted throughout the frontoethmoidal and maxillary sinuses. Left mastoid effusion noted, of doubtful significance in the acute setting. Inner ear structures grossly normal. Other: None. MRA HEAD FINDINGS ANTERIOR CIRCULATION: Examination degraded by motion artifact. Distal cervical segments of the internal carotid arteries are patent with antegrade flow. Distal cervical left ICA tortuous. Petrous, cavernous, and supraclinoid segments patent without definite stenosis or other abnormality. A1 segments patent bilaterally. Normal anterior communicating artery complex. Anterior cerebral arteries patent to their distal aspects without stenosis. No visible M1 stenosis or occlusion. Negative MCA  bifurcations. Distal MCA branches well perfused and symmetric. POSTERIOR CIRCULATION: Visualized distal cervical segments of the internal carotid arteries are patent with antegrade flow. Left vertebral artery slightly dominant. Left PICA origin patent and normal. Right PICA not definitely seen. Basilar patent to its distal aspect without appreciable stenosis. Superior cerebellar arteries patent bilaterally. Both PCA supplied via the basilar as well as small bilateral posterior communicating arteries. PCAs perfused to their distal aspects without appreciable stenosis. No intracranial aneurysm on this motion degraded exam. IMPRESSION: MRI HEAD IMPRESSION: 1. 2.6 cm acute to early subacute ischemic infarct involving the right basal ganglia. Associated mild petechial hemorrhage without frank hemorrhagic transformation. 2. Underlying moderate chronic microvascular ischemic disease. MRA HEAD IMPRESSION: 1. Motion degraded exam. 2. Negative intracranial MRA. No large vessel occlusion, hemodynamically significant stenosis, or other acute vascular abnormality. Electronically Signed   By: Jeannine Boga M.D.   On: 01/26/2021 04:11

## 2021-01-26 NOTE — Progress Notes (Signed)
STROKE TEAM PROGRESS NOTE   INTERVAL HISTORY Her OT and NT are at the bedside.  Pt sitting in chair, BP still on the high end. Still has left facial droop but left UE and LE strength is near normal. She was on Xarelto PTA but was not on at admission due to heavy menstrual. MRI showed right BG infarct, relative large size for BG infarct. She denies any miscarriage in the past. Will check hypercoagulable labs. Currently on heparin IV will transition to Xarelto if she tolerating with heparin IV.    OBJECTIVE Vitals:   01/26/21 0153 01/26/21 0319 01/26/21 0348 01/26/21 0412  BP: (!) 165/75 (!) 192/84 (!) 174/83 (!) 159/78  Pulse: 87 89 83 84  Resp:   19   Temp:  97.9 F (36.6 C) 98.3 F (36.8 C)   TempSrc:  Oral Oral   SpO2:  100% 100% 98%  Weight:      Height:        CBC:  Recent Labs  Lab 01/25/21 1457 01/25/21 2113 01/26/21 0332  WBC 11.5* 9.2 9.8  NEUTROABS 7.2  --   --   HGB 10.6* 9.3* 9.0*  HCT 34.2* 30.7* 29.7*  MCV 82.4 82.1 82.7  PLT 329 260 097    Basic Metabolic Panel:  Recent Labs  Lab 01/25/21 1457 01/25/21 2113 01/26/21 0332  NA 136  --  139  K 3.3*  --  3.3*  CL 106  --  104  CO2 24  --  27  GLUCOSE 272*  --  284*  BUN 12  --  9  CREATININE 1.10* 1.12* 1.20*  CALCIUM 8.6*  --  8.4*    Lipid Panel:     Component Value Date/Time   CHOL 374 (H) 01/26/2021 0332   TRIG 213 (H) 01/26/2021 0332   HDL 50 01/26/2021 0332   CHOLHDL 7.5 01/26/2021 0332   VLDL 43 (H) 01/26/2021 0332   LDLCALC 281 (H) 01/26/2021 0332   HgbA1c:  Lab Results  Component Value Date   HGBA1C 8.9 (H) 01/26/2021   Urine Drug Screen: No results found for: LABOPIA, COCAINSCRNUR, LABBENZ, AMPHETMU, THCU, LABBARB  Alcohol Level No results found for: ETH  IMAGING  CT Head Wo Contrast 01/25/2021 IMPRESSION:  Acute to subacute infarct centered in the right basal ganglia. No evidence of hemorrhagic transformation or significant mass effect.   MR ANGIO HEAD WO  CONTRAST 01/26/2021  MRI HEAD  IMPRESSION:  1. 2.6 cm acute to early subacute ischemic infarct involving the right basal ganglia. Associated mild petechial hemorrhage without frank hemorrhagic transformation.  2. Underlying moderate chronic microvascular ischemic disease.   MRA HEAD  IMPRESSION:  1. Motion degraded exam.  2. Negative intracranial MRA. No large vessel occlusion, hemodynamically significant stenosis, or other acute vascular abnormality.    Transthoracic Echocardiogram  00/00/2021 IMPRESSIONS  1. Left ventricular ejection fraction, by estimation, is 60 to 65%. The  left ventricle has normal function. The left ventricle has no regional  wall motion abnormalities. There is moderate left ventricular hypertrophy.  Left ventricular diastolic  parameters are consistent with Grade I diastolic dysfunction (impaired  relaxation). Elevated left ventricular end-diastolic pressure.  2. Right ventricular systolic function is normal. The right ventricular  size is normal.  3. A small pericardial effusion is present. The pericardial effusion is  circumferential. There is no evidence of cardiac tamponade.  4. The mitral valve is abnormal. Trivial mitral valve regurgitation.  5. The aortic valve is tricuspid. Aortic valve regurgitation is  not  visualized.  6. Mildly dilated pulmonary artery.  7. The inferior vena cava is normal in size with greater than 50%  respiratory variability, suggesting right atrial pressure of 3 mmHg.  8. Agitated saline contrast bubble study was negative, with no evidence  of any interatrial shunt.   Bilateral Carotid Dopplers  00/00/2021 Summary:  Right Carotid: The extracranial vessels were near-normal with only minimal wall thickening or plaque.  Left Carotid: The extracranial vessels were near-normal with only minimal wall thickening or plaque.  Vertebrals: Bilateral vertebral arteries demonstrate antegrade flow.  Subclavians: Normal flow  hemodynamics were seen in bilateral subclavian arteries.   ECG - SR rate 84 BPM. (See cardiology reading for complete details)  PHYSICAL EXAM  Temp:  [97.6 F (36.4 C)-98.3 F (36.8 C)] 98.2 F (36.8 C) (04/30 1244) Pulse Rate:  [75-97] 75 (04/30 1244) Resp:  [13-21] 18 (04/30 1244) BP: (145-246)/(75-134) 184/91 (04/30 1244) SpO2:  [98 %-100 %] 100 % (04/30 1244) Weight:  [152 kg] 152 kg (04/29 1752)  General - morbid obesity, well developed, in no apparent distress.  Ophthalmologic - fundi not visualized due to noncooperation.  Cardiovascular - Regular rhythm and rate.  Mental Status -  Level of arousal and orientation to time, place, and person were intact. Language including expression, naming, repetition, comprehension was assessed and found intact. Attention span and concentration were normal. Fund of Knowledge was assessed and was intact.  Cranial Nerves II - XII - II - Visual field intact OU. III, IV, VI - Extraocular movements intact. V - Facial sensation intact bilaterally. VII - left facial droop. VIII - Hearing & vestibular intact bilaterally. X - Palate elevates symmetrically. XI - Chin turning & shoulder shrug intact bilaterally. XII - Tongue protrusion intact.  Motor Strength - The patient's strength was normal in all extremities and pronator drift was absent except slight left finger grip weakness.  Bulk was normal and fasciculations were absent.   Motor Tone - Muscle tone was assessed at the neck and appendages and was normal.  Reflexes - The patient's reflexes were symmetrical in all extremities and she had no pathological reflexes.  Sensory - Light touch, temperature/pinprick were assessed and were symmetrical.    Coordination - The patient had normal movements in the hands with no ataxia or dysmetria.  Tremor was absent.  Gait and Station - deferred.    ASSESSMENT/PLAN Ms. Sherri Dixon is a 40 y.o. female with history of DM2, GERD, PE on  Xarelto, HLD who presents with nausea and headache x 3 days with facial droop since 4/23. (pt reported hx of atrial fibrillation but this could not be confirmed) She did not receive IV t-PA due to late presentation (>4.5 hours from time of onset) and recent anticoagulation.   Stroke: large right BG infarct - embolic vs small vessel disease.  CT head - Acute to subacute infarct centered in the right basal ganglia.  MRI head - 2.6 cm acute to early subacute ischemic infarct involving the right basal ganglia. Associated mild petechial hemorrhage without frank hemorrhagic transformation.  MRA head - Motion degraded exam. Negative intracranial MRA.  Carotid Doppler - near normal  2D Echo - EF 60 - 65%. No cardiac source of emboli identified.   Sars Corona Virus 2 - negative  LDL - 281  HgbA1c - 8.9  UDS - pending  Hypercoagulable work up pending  VTE prophylaxis - heparin IV  Xarelto (rivaroxaban) daily prior to admission, now on heparin IV. 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 Long-term BP goal normotensive  Hyperlipidemia  Home Lipid lowering medication: Zocor 20 mg daily  LDL 281, goal < 70  Current lipid lowering medication: Crestor 40 mg daily  Continue statin at discharge  Diabetes, uncontrolled  Home diabetic meds: insulin  Current diabetic meds: insulin  HgbA1c 8.9, goal < 7.0  CBGs  SSI  Need DM  education and close PCP follow up  Other Stroke Risk Factors  ETOH use, advised to drink no more than 1 alcoholic beverage per day.  Morbid obesity, Body mass index is 55.75 kg/m., recommend weight loss, diet and exercise as appropriate   Other Active Problems, Findings, Recommendations and/or Plan  Code status - Full code Hypokalemia - 3.3 ->3.3 - supplement AKI - creatinine - 1.12->1.20 - encourage po intake  Hospital day # 0  Rosalin Hawking, MD PhD Stroke Neurology 01/26/2021 3:16 PM    To contact Stroke Continuity provider, please refer to [email or web address] After hours, contact General Neurology

## 2021-01-26 NOTE — Evaluation (Signed)
Occupational Therapy Evaluation and Discharge Patient Details Name: Sherri Dixon MRN: [id number] DOB: 28-Dec-1980 Today's Date: 01/26/2021    History of Present Illness Sherri Dixon is a 40 y.o. female with PMH significant for DM2, GERD, PE on Xarelto, HLD who presents with headache x 3 days and facial droop since 4/23. Workup with CTH demonstrated Right basal ganglia acute/subacute stroke. Reports that facial droop was first noted by her husband on Saturday. They did not think too much about it at that time. This has somewhat improved. Reports headache, bifrontal throbbing for the last 3 days with nausea.   Clinical Impression   This 40 yo female admitted with above presents to acute OT at mainly a Mod I level for basic ADLs with AE with only exception needing min A to step over tub--family A. Pt educated and practiced with LB AE and made aware where she could get the pieces if she so desires. Also educated on activities and exercises she can do with LUE to help strengthen shoulder. No further acute OT needs and no follow OT recommended.We will sign off.    Follow Up Recommendations  No OT follow up;Supervision - Intermittent    Equipment Recommendations  None recommended by OT       Precautions / Restrictions Precautions Precautions: None Restrictions Weight Bearing Restrictions: No      Mobility Bed Mobility               General bed mobility comments: Pt up in recliner    Transfers Overall transfer level: Independent                    Balance Overall balance assessment: Mild deficits observed, not formally tested                                         ADL either performed or assessed with clinical judgement   ADL Overall ADL's : Modified independent                                       General ADL Comments: Educated pt on use of wide sock aid to donn socks, reacher to doff socks and to put underwear and pants on.  Educated on side step into tub instead of foreward step into tub (still needs A to get leg completely over but less)     Vision Patient Visual Report: No change from baseline Vision Assessment?: Yes Eye Alignment: Within Functional Limits Ocular Range of Motion: Within Functional Limits Alignment/Gaze Preference: Within Defined Limits Tracking/Visual Pursuits: Able to track stimulus in all quads without difficulty Saccades: Within functional limits Convergence: Within functional limits Visual Fields: No apparent deficits            Pertinent Vitals/Pain Pain Assessment: No/denies pain     Hand Dominance Right   Extremity/Trunk Assessment Upper Extremity Assessment Upper Extremity Assessment: LUE deficits/detail LUE Deficits / Details: Mild decreased strength left shoulder (4/5) rest 5/5 LUE Coordination: decreased gross motor           Communication Communication Communication: No difficulties   Cognition Arousal/Alertness: Awake/alert Behavior During Therapy: WFL for tasks assessed/performed Overall Cognitive Status: Within Functional Limits for tasks assessed  Exercises  Educated on GM activities and exercises she can do at home to help strengthen her left shoulder.        Home Living Family/patient expects to be discharged to:: Private residence Living Arrangements: Spouse/significant other;Children Available Help at Discharge: Family;Available PRN/intermittently Type of Home: House Home Access: Stairs to enter [employer] of Steps: 3 Entrance Stairs-Rails: Right Home Layout: One level     Bathroom Shower/Tub: Teacher, early years/pre: Standard     Home Equipment: None          Prior Functioning/Environment Level of Independence: Needs assistance  Gait / Transfers Assistance Needed: ambulates independently with no AD ADL's / Homemaking Assistance Needed: requires  assistance for getting in/out of tub shower but reports independently bathing and dressing self            OT Problem List: Decreased range of motion;Impaired balance (sitting and/or standing);Obesity         OT Goals(Current goals can be found in the care plan section) Acute Rehab OT Goals Patient Stated Goal: to go home                AM-PAC OT "6 Clicks" Daily Activity     Outcome Measure Help from another person eating meals?: None Help from another person taking care of personal grooming?: None Help from another person toileting, which includes using toliet, bedpan, or urinal?: None Help from another person bathing (including washing, rinsing, drying)?: None Help from another person to put on and taking off regular upper body clothing?: None Help from another person to put on and taking off regular lower body clothing?: None 6 Click Score: 24   End of Session    Activity Tolerance: Patient tolerated treatment well Patient left: in chair;with call bell/phone within reach;with chair alarm set  OT Visit Diagnosis: Unsteadiness on feet (R26.81)                Time: 7262-0355 OT Time Calculation (min): 37 min Charges:  OT General Charges $OT Visit: 1 Visit OT Evaluation $OT Eval Moderate Complexity: 1 Mod OT Treatments $Self Care/Home Management : 8-22 mins  [place], OTR/L Acute [employer] Pager [phone] Office [phone]     Almon Register 01/26/2021, 1:35 PM

## 2021-01-26 NOTE — Progress Notes (Signed)
SLP Cancellation Note  Patient Details Name: Sherri Dixon MRN: [id number] DOB: June 10, 1981   Cancelled treatment:       Reason Eval/Treat Not Completed: Patient at procedure or test/unavailable- will continue efforts.  [name] L. Tivis Ringer, [place] Office number [phone] Pager [phone]    [name] 01/26/2021, 10:48 AM

## 2021-01-26 NOTE — Progress Notes (Signed)
Inpatient Diabetes Program Recommendations  AACE/ADA: New Consensus Statement on Inpatient Glycemic Control (2015)  Target Ranges:  Prepandial:   less than 140 mg/dL      Peak postprandial:   less than 180 mg/dL (1-2 hours)      Critically ill patients:  140 - 180 mg/dL   Lab Results  Component Value Date   GLUCAP 242 (H) 01/26/2021   HGBA1C 8.9 (H) 01/26/2021    Review of Glycemic Control  Diabetes history: DM Outpatient Diabetes medications: Lantus 50 units qd + Novolog 12-20 units meal coverage tid Current orders for Inpatient glycemic control: Lantus 25 units bid + Novolog 0-15 units tid + hs 0-5 units  Inpatient Diabetes Program Recommendations:   Patient requests prescription for continous glucose sensor on discharge [facility] sensor [phone]  Spoke with pt. Via phone (DM coordinator working remotely on call for system wide DM needs). Patient feels comfortable with administering her insulin and discussed and answered basic questions regarding nutrition and exercise and medications. A1c has decreased from 10.0 12-15-20 to currently 8.9. Patient agrees to plan to increase her exercise @ home and review her carbohydrates more closely. Patient expressed appreciation for call and information.  Thank you, Nani Gasser. Jahmire Ruffins, RN, MSN, CDE  Diabetes Coordinator Inpatient Glycemic Control Team Team Pager [phone] (8am-5pm) 01/26/2021 12:06 PM

## 2021-01-26 NOTE — Evaluation (Signed)
Physical Therapy Evaluation & Discharge Patient Details Name: Sherri Dixon MRN: [id number] DOB: [birth year] Today's Date: 01/26/2021   History of Present Illness  40 y/o female presented to ED on 4/29 with headache x 3 days and facial droop since 4/23. Workup with CTH demonstrated R basal ganglia acute/subacute stroke. MRI: acute to early subacute ischemic infarct involving R basal ganglia. PMH: DM2, GERD, PE on Xarelto, HLD, CHF  Clinical Impression  PTA, patient lives with husband and child and reports independence with mobility with no AD and requires assistance getting in/out of tub shower. Patient functioning at independent level with mobility with no AD but demonstrates mild balance deficits. Educated patient about progressive walking program and increasing activity to total of 60 minutes of walking per day. No skilled PT needs required acutely. Recommend HHPT following discharge to maximize functional independence and safety in the home.     Follow Up Recommendations Home health PT;Supervision for mobility/OOB    Equipment Recommendations  None recommended by PT    Recommendations for Other Services       Precautions / Restrictions Precautions Precautions: Fall Restrictions Weight Bearing Restrictions: No      Mobility  Bed Mobility Overal bed mobility: Independent             General bed mobility comments: Pt up in recliner    Transfers Overall transfer level: Independent                  Ambulation/Gait Ambulation/Gait assistance: Independent Gait Distance (Feet): 200 Feet Assistive device: None Gait Pattern/deviations: Wide base of support;WFL(Within Functional Limits)        Stairs Stairs: Yes Stairs assistance: Independent Stair Management: One rail Right;Step to pattern;Forwards Number of Stairs: 2    Wheelchair Mobility    Modified Rankin (Stroke Patients Only)       Balance Overall balance assessment: Mild deficits observed, not  formally tested                                           Pertinent Vitals/Pain Pain Assessment: No/denies pain    Home Living Family/patient expects to be discharged to:: Private residence Living Arrangements: Spouse/significant other;Children Available Help at Discharge: Family;Available PRN/intermittently Type of Home: House Home Access: Stairs to enter Entrance Stairs-Rails: Right Entrance Stairs-Number of Steps: 3 Home Layout: One level Home Equipment: None      Prior Function Level of Independence: Needs assistance   Gait / Transfers Assistance Needed: ambulates independently with no AD  ADL's / Homemaking Assistance Needed: requires assistance for getting in/out of tub shower but reports independently bathing and dressing self        Hand Dominance   Dominant Hand: Right    Extremity/Trunk Assessment   Upper Extremity Assessment Upper Extremity Assessment: Defer to OT evaluation LUE Deficits / Details: Mild decreased strength left shoulder (4/5) rest 5/5 LUE Coordination: decreased gross motor    Lower Extremity Assessment Lower Extremity Assessment: LLE deficits/detail LLE Deficits / Details: mild weakness in L knee extension and PF but unable to get adequate MMT on L hip due to body habitus LLE Sensation: WNL       Communication   Communication: No difficulties  Cognition Arousal/Alertness: Awake/alert Behavior During Therapy: WFL for tasks assessed/performed Overall Cognitive Status: Within Functional Limits for tasks assessed  General Comments      Exercises     Assessment/Plan    PT Assessment Patent does not need any further PT services  PT Problem List Decreased strength;Decreased activity tolerance;Decreased balance       PT Treatment Interventions      PT Goals (Current goals can be found in the Care Plan section)  Acute Rehab PT Goals Patient Stated Goal: to  go home PT Goal Formulation: With patient    Frequency     Barriers to discharge        Co-evaluation               AM-PAC PT "6 Clicks" Mobility  Outcome Measure Help needed turning from your back to your side while in a flat bed without using bedrails?: None Help needed moving from lying on your back to sitting on the side of a flat bed without using bedrails?: None Help needed moving to and from a bed to a chair (including a wheelchair)?: None Help needed standing up from a chair using your arms (e.g., wheelchair or bedside chair)?: None Help needed to walk in hospital room?: None Help needed climbing 3-5 steps with a railing? : None 6 Click Score: 24    End of Session Equipment Utilized During Treatment: Gait belt Activity Tolerance: Patient tolerated treatment well Patient left: in chair;with call bell/phone within reach;with chair alarm set;with family/visitor present Nurse Communication: Mobility status PT Visit Diagnosis: Unsteadiness on feet (R26.81);Muscle weakness (generalized) (M62.81)    Time: 0762-2633 PT Time Calculation (min) (ACUTE ONLY): 26 min   Charges:   PT Evaluation $PT Eval Low Complexity: 1 Low          Nikky Duba A. Gilford Dixon PT, DPT Acute Rehabilitation Services Pager [phone] Office [phone]   Linna Hoff 01/26/2021, 3:53 PM

## 2021-01-26 NOTE — Progress Notes (Addendum)
[place] for heparin Indication: h/o PE  Allergies  Allergen Reactions  . Amlodipine Swelling  . Humalog Kwikpen [Insulin Lispro] Other (See Comments)    75/50---swelling and ulcers in mouth     Patient Measurements: Height: 5\' 5"  (165.1 cm) Weight: (!) 152 kg (335 lb) IBW/kg (Calculated) : 57 Heparin Dosing Weight: 95.5 kg  Vital Signs: Temp: 98.7 F (37.1 C) (04/30 2032) Temp Source: Oral (04/30 2032) BP: 196/94 (04/30 2032) Pulse Rate: 78 (04/30 2032)  Labs: Recent Labs    01/25/21 1457 01/25/21 1804 01/25/21 2113 01/26/21 0332 01/26/21 2030  HGB 10.6*  --  9.3* 9.0*  --   HCT 34.2*  --  30.7* 29.7*  --   PLT 329  --  260 274  --   APTT  --  28  --   --  57*  LABPROT  --  12.7  --   --   --   INR  --  1.0  --   --   --   HEPARINUNFRC  --   --   --   --  0.24*  CREATININE 1.10*  --  1.12* 1.20*  --     Estimated Creatinine Clearance: 92.5 mL/min (A) (by C-G formula based on SCr of 1.2 mg/dL (H)).   Medical History: Past Medical History:  Diagnosis Date  . Anemia   . Asthma   . CHF (congestive heart failure) (HCC)    Pt reports that father has CHF, not her  . Diabetes mellitus without complication ([place])   . Dyslipidemia   . GERD (gastroesophageal reflux disease)   . Pulmonary embolism [facility])     Assessment: 40 yo female admitted for work-up suggesting right basal ganglia moderate to large sized ischemic CVA. Patient on xarelto for h/o PE, but has not taken in last week due to heavy menstrual periods. Pharmacy consulted to doseheparin drip with no boluses.  Due to stroke work-up will also target lower end of therapeutic range (0.3-0.5).  -heparin level= 0.24    Goal of Therapy:  Heparin level 0.3-0.5 Monitor platelets by anticoagulation protocol: Yes   Plan:  -Increase heparin to 1550 units/hr -Heparin level in 6 hours and daily wth CBC daily  Hildred Laser, PharmD Clinical Pharmacist **Pharmacist phone  directory can now be found on [email or web address] ([email or web address]).  Listed under [place].

## 2021-01-26 NOTE — Progress Notes (Signed)
  Echocardiogram 2D Echocardiogram has been performed.  Sherri Dixon F 01/26/2021, 11:22 AM

## 2021-01-27 ENCOUNTER — Inpatient Hospital Stay (HOSPITAL_COMMUNITY): Payer: 59

## 2021-01-27 DIAGNOSIS — E1165 Type 2 diabetes mellitus with hyperglycemia: Secondary | ICD-10-CM

## 2021-01-27 DIAGNOSIS — E782 Mixed hyperlipidemia: Secondary | ICD-10-CM

## 2021-01-27 LAB — BASIC METABOLIC PANEL
Anion gap: 6 (ref 5–15)
BUN: 10 mg/dL (ref 6–20)
CO2: 25 mmol/L (ref 22–32)
Calcium: 8 mg/dL — ABNORMAL LOW (ref 8.9–10.3)
Chloride: 108 mmol/L (ref 98–111)
Creatinine, Ser: 1.17 mg/dL — ABNORMAL HIGH (ref 0.44–1.00)
GFR, Estimated: >60 mL/min (ref >60)
Glucose, Bld: 68 mg/dL — ABNORMAL LOW (ref 70–99)
Potassium: 3.1 mmol/L — ABNORMAL LOW (ref 3.5–5.1)
Sodium: 139 mmol/L (ref 135–145)

## 2021-01-27 LAB — URINALYSIS, ROUTINE W REFLEX MICROSCOPIC
Bilirubin Urine: NEGATIVE
Glucose, UA: >=500 mg/dL — AB
Hgb urine dipstick: NEGATIVE
Ketones, ur: NEGATIVE mg/dL
Leukocytes,Ua: NEGATIVE
Nitrite: NEGATIVE
Protein, ur: >=300 mg/dL — AB
Specific Gravity, Urine: 1.014 (ref 1.005–1.030)
pH: 6 (ref 5.0–8.0)

## 2021-01-27 LAB — HCG, QUANTITATIVE, PREGNANCY: hCG, Beta Chain, Quant, S: 1 m[IU]/mL (ref <5)

## 2021-01-27 LAB — CBC
HCT: 29.1 % — ABNORMAL LOW (ref 36.0–46.0)
Hemoglobin: 8.9 g/dL — ABNORMAL LOW (ref 12.0–15.0)
MCH: 25.4 pg — ABNORMAL LOW (ref 26.0–34.0)
MCHC: 30.6 g/dL (ref 30.0–36.0)
MCV: 83.1 fL (ref 80.0–100.0)
Platelets: 256 10*3/uL (ref 150–400)
RBC: 3.5 MIL/uL — ABNORMAL LOW (ref 3.87–5.11)
RDW: 15.9 % — ABNORMAL HIGH (ref 11.5–15.5)
WBC: 10.5 10*3/uL (ref 4.0–10.5)
nRBC: 0 % (ref 0.0–0.2)

## 2021-01-27 LAB — RAPID URINE DRUG SCREEN, HOSP PERFORMED
Amphetamines: NOT DETECTED
Barbiturates: NOT DETECTED
Benzodiazepines: NOT DETECTED
Cocaine: NOT DETECTED
Opiates: NOT DETECTED
Tetrahydrocannabinol: NOT DETECTED

## 2021-01-27 LAB — GLUCOSE, CAPILLARY
Glucose-Capillary: 93 mg/dL (ref 70–99)
Glucose-Capillary: 153 mg/dL — ABNORMAL HIGH (ref 70–99)
Glucose-Capillary: 110 mg/dL — ABNORMAL HIGH (ref 70–99)
Glucose-Capillary: 137 mg/dL — ABNORMAL HIGH (ref 70–99)

## 2021-01-27 LAB — HEPARIN LEVEL (UNFRACTIONATED)
Heparin Unfractionated: 0.13 IU/mL — ABNORMAL LOW (ref 0.30–0.70)
Heparin Unfractionated: 0.64 IU/mL (ref 0.30–0.70)

## 2021-01-27 LAB — PREGNANCY, URINE: Preg Test, Ur: NEGATIVE

## 2021-01-27 IMAGING — US US RENAL
1 series · 14 of 25 positions shown · non-contrast
Comparison: None.

CLINICAL DATA: Proteinuria

EXAM:
RENAL / URINARY TRACT ULTRASOUND COMPLETE

[Series 1: us renal · 14 of 44 slices shown]
[im 1/44]
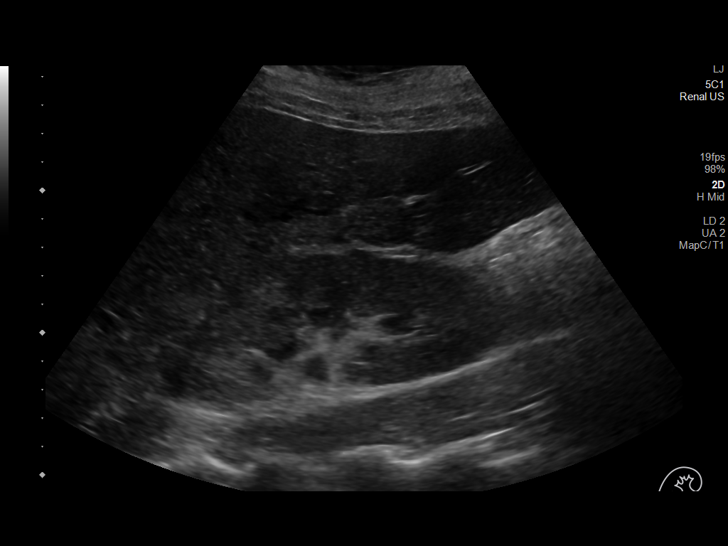
[im 4/44]
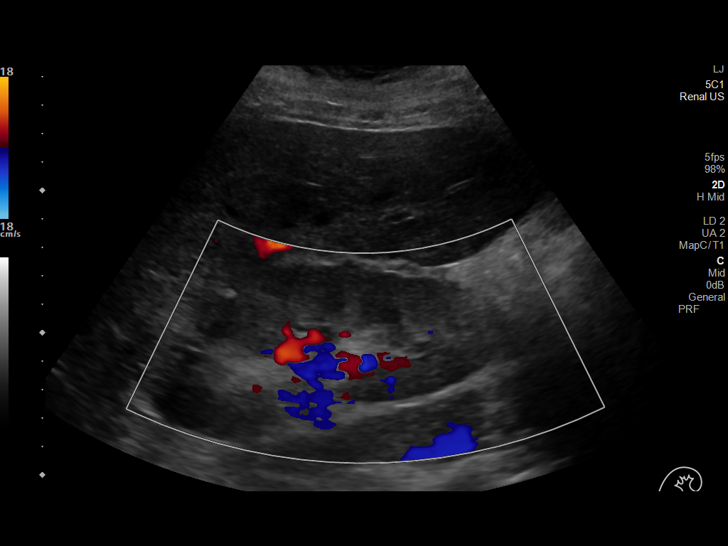
[im 8/44]
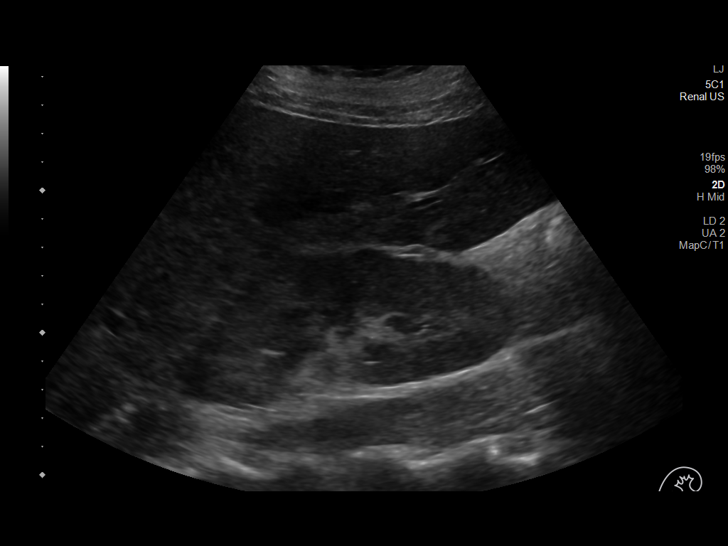
[im 11/44]
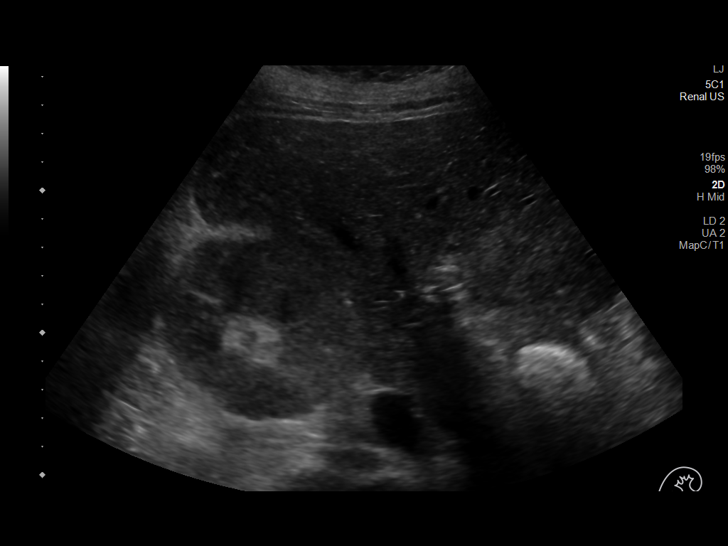
[im 15/44]
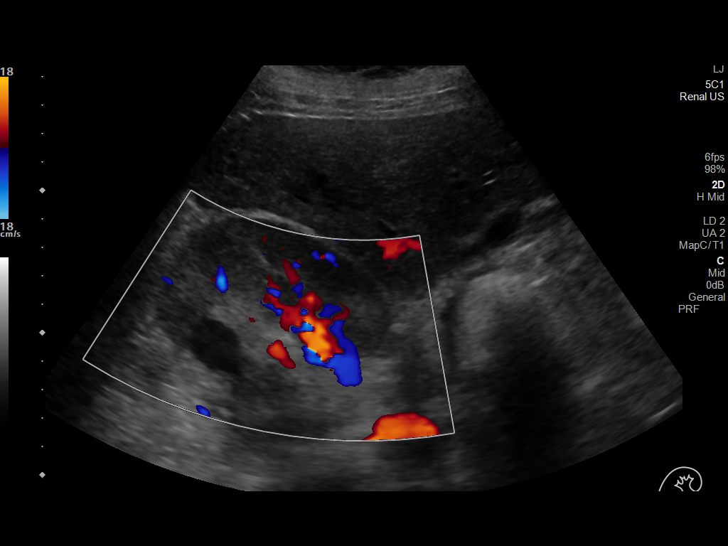
[im 17/44]
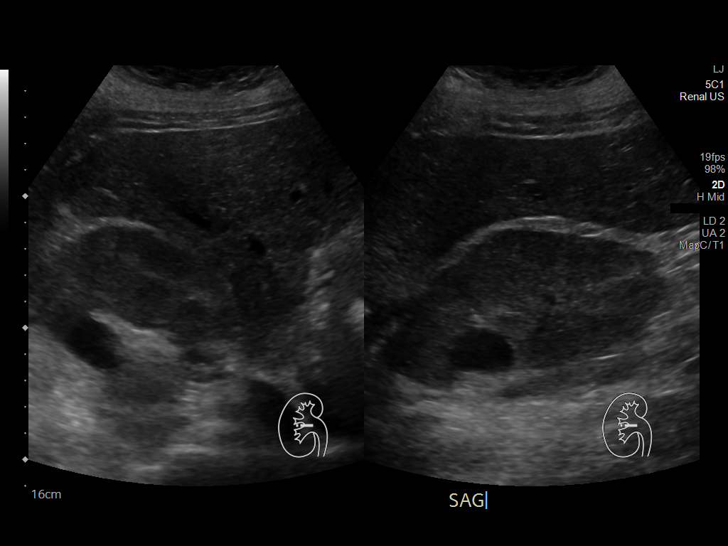
[im 20/44]
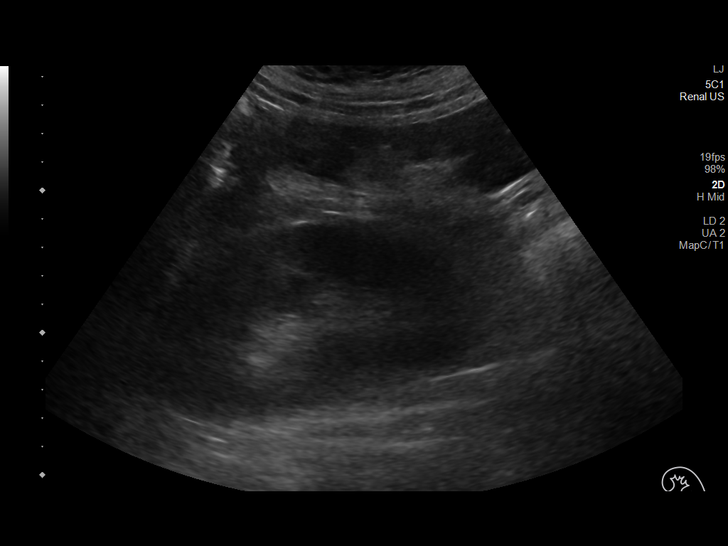
[im 24/44]
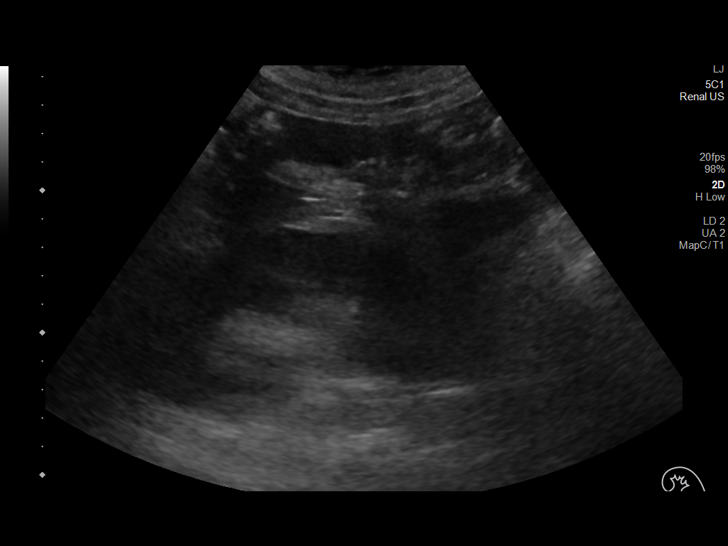
[im 27/44]
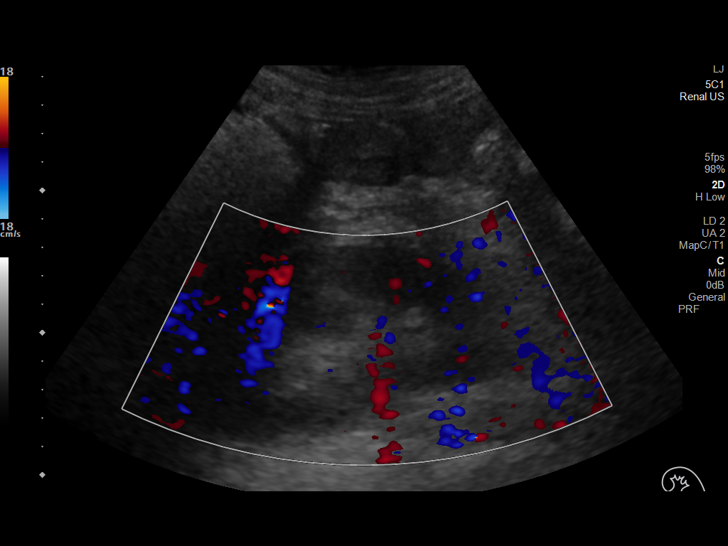
[im 29/44]
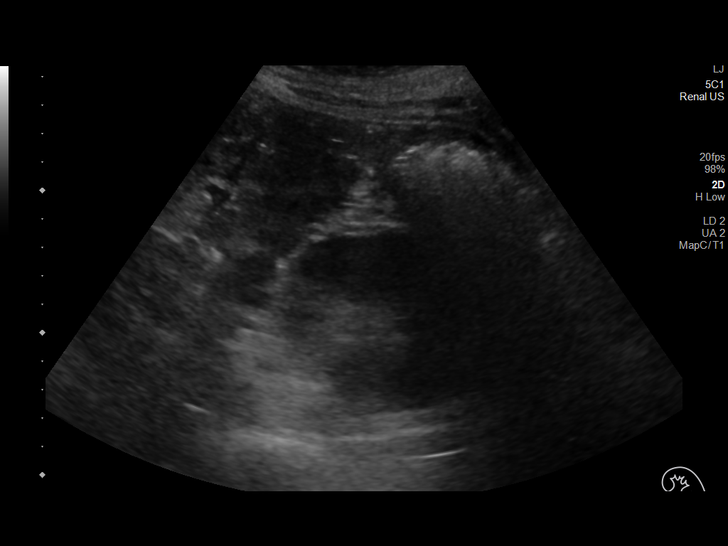
[im 33/44]
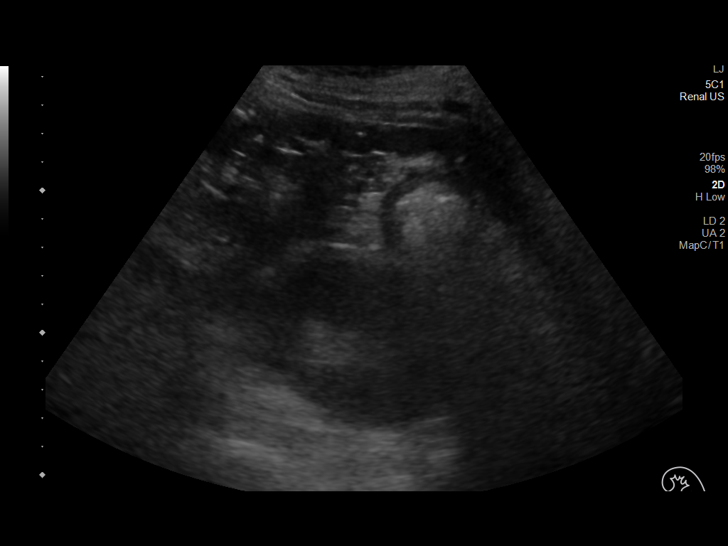
[im 36/44]
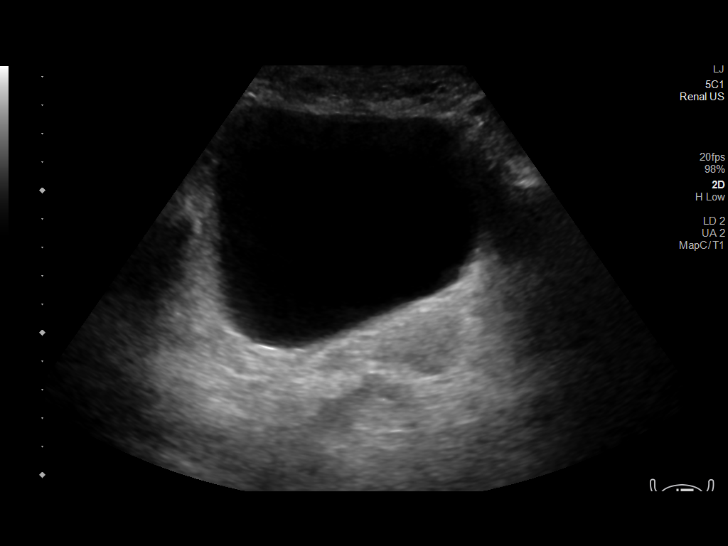
[im 40/44]
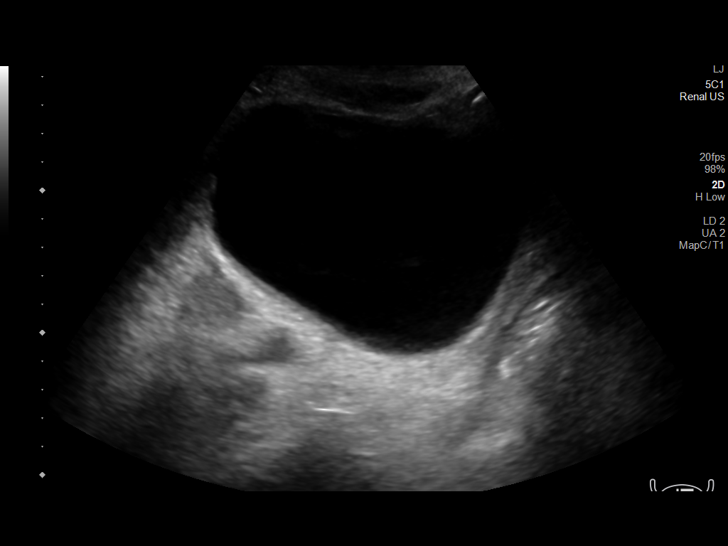
[im 44/44]
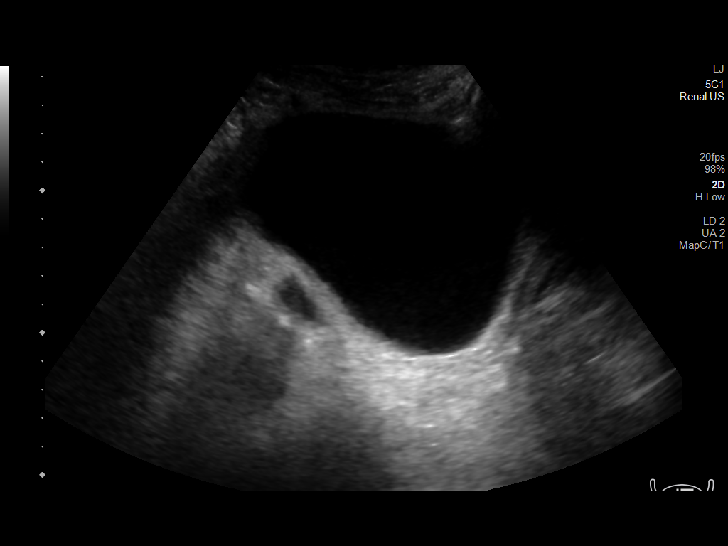

[14 of 25 positions shown; findings below may reference images not displayed]

FINDINGS: Right Kidney:

Renal measurements: 12.4 x 5.3 x 7.3 cm = volume: 250.2 mL. Contains
a 2.6 cm cyst in the medial upper pole.

Left Kidney:

Renal measurements: 12.3 x 6.3 x 6.3 cm = volume: 256 mL.
Echogenicity within normal limits. No mass or hydronephrosis
visualized.

Bladder:

Appears normal for degree of bladder distention.

Other:

None.
IMPRESSION: Suboptimal study due to patient body habitus. There is a 2.6 cm cyst
in the upper pole the right kidney. No other abnormalities.

## 2021-01-27 MED ORDER — POTASSIUM CHLORIDE 20 MEQ PO PACK
40.0000 meq | PACK | Freq: Once | ORAL | Status: AC
  Administered 2021-01-27: 40 meq via ORAL
  Filled 2021-01-27: qty 2

## 2021-01-27 MED ORDER — LISINOPRIL 2.5 MG PO TABS: 5.0000 mg | ORAL_TABLET | Freq: Every day | ORAL | Status: DC

## 2021-01-27 MED ORDER — POTASSIUM CHLORIDE 20 MEQ PO PACK
20.0000 meq | PACK | Freq: Once | ORAL | Status: AC
  Administered 2021-01-27: 20 meq via ORAL
  Filled 2021-01-27: qty 1

## 2021-01-27 NOTE — Progress Notes (Signed)
ANTICOAGULATION CONSULT NOTE - Follow Up Consult  Pharmacy Consult for heparin Indication: acute stroke w/ h/o PE  Heparin dosing wt: 95kg  Labs: Recent Labs    01/25/21 1804 01/25/21 2113 01/26/21 0332 01/26/21 2030 01/27/21 0300 01/27/21 0805 01/27/21 2137  HGB  --  9.3* 9.0*  --  8.9*  --   --   HCT  --  30.7* 29.7*  --  29.1*  --   --   PLT  --  260 274  --  256  --   --   APTT 28  --   --  57*  --   --   --   LABPROT 12.7  --   --   --   --   --   --   INR 1.0  --   --   --   --   --   --   HEPARINUNFRC  --   --   --  0.24* 0.13*  --  0.64  CREATININE  --  1.12* 1.20*  --   --  1.17*  --     Assessment: 40yo female on Xarelto PTA for history of PE. She is here with CVA on heparin,  -heparin level= 0.64 on 1900 units/hr  Goal of Therapy:  Heparin level 0.3-0.5 units/ml   Plan:  -Decrease heparin to 1700 units/hr -Heparin level and CBC in am  Hildred Laser, PharmD Clinical Pharmacist **Pharmacist phone directory can now be found on [email or web address] ([email or web address]).  Listed under [place].

## 2021-01-27 NOTE — Progress Notes (Signed)
ANTICOAGULATION CONSULT NOTE - Follow Up Consult  Pharmacy Consult for heparin Indication: acute stroke w/ h/o PE  Labs: Recent Labs    01/25/21 1804 01/25/21 2113 01/26/21 0332 01/26/21 2030 01/27/21 0300 01/27/21 0805  HGB  --  9.3* 9.0*  --  8.9*  --   HCT  --  30.7* 29.7*  --  29.1*  --   PLT  --  260 274  --  256  --   APTT 28  --   --  57*  --   --   LABPROT 12.7  --   --   --   --   --   INR 1.0  --   --   --   --   --   HEPARINUNFRC  --   --   --  0.24* 0.13*  --   CREATININE  --  1.12* 1.20*  --   --  1.17*    Assessment: 39yo female subtherapeutic on heparin with lower heparin level despite rate increase though could be Xarelto still clearing; no gtt issues or signs of bleeding per RN.  Afternoon update: Patient's IV is out and IV team has been called to place a new IV. Heparin drip off since ~0830. Will f/u with RN once drip restarted and will retime next heparin level.   Goal of Therapy:  Heparin level 0.3-0.5 units/ml   Plan:  Increase heparin gtt by ~2-3 units/kg/hr to 1900 units/hr and check level in 6 hours.    Meliyah Simon A. Levada Dy, PharmD, BCPS, FNKF Clinical Pharmacist [facility] Please utilize [facility] for appropriate phone number to reach the unit pharmacist ([place])   01/27/2021,12:54 PM

## 2021-01-27 NOTE — Progress Notes (Signed)
ANTICOAGULATION CONSULT NOTE - Follow Up Consult  Pharmacy Consult for heparin Indication: acute stroke w/ h/o PE  Labs: Recent Labs    01/25/21 1457 01/25/21 1804 01/25/21 2113 01/26/21 0332 01/26/21 2030 01/27/21 0300  HGB 10.6*  --  9.3* 9.0*  --  8.9*  HCT 34.2*  --  30.7* 29.7*  --  29.1*  PLT 329  --  260 274  --  256  APTT  --  28  --   --  57*  --   LABPROT  --  12.7  --   --   --   --   INR  --  1.0  --   --   --   --   HEPARINUNFRC  --   --   --   --  0.24* 0.13*  CREATININE 1.10*  --  1.12* 1.20*  --   --     Assessment: 40yo female subtherapeutic on heparin with lower heparin level despite rate increase though could be Xarelto still clearing; no gtt issues or signs of bleeding per RN.  Goal of Therapy:  Heparin level 0.3-0.5 units/ml   Plan:  Will increase heparin gtt by ~2-3 units/kg/hr to 1900 units/hr and check level in 6 hours.    Wynona Neat, PharmD, BCPS  01/27/2021,4:03 AM

## 2021-01-27 NOTE — Progress Notes (Signed)
STROKE TEAM PROGRESS NOTE   INTERVAL HISTORY No family is at the bedside.  Pt sitting at edge of bed, no complaints.  On heparin IV, tolerating well.  Plan to switch back to Xarelto tomorrow before discharge.   OBJECTIVE Vitals:   01/26/21 2032 01/26/21 2322 01/27/21 0337 01/27/21 1527  BP: (!) 196/94 118/76 (!) 161/84 (!) 170/129  Pulse: 78 78 80 86  Resp:  19 18 18   Temp: 98.7 F (37.1 C) 98.3 F (36.8 C) 98 F (36.7 C) 98.3 F (36.8 C)  TempSrc: Oral Oral Oral   SpO2: 100% 100% 99% 100%  Weight:      Height:        CBC:  Recent Labs  Lab 01/25/21 1457 01/25/21 2113 01/26/21 0332 01/27/21 0300  WBC 11.5*   < > 9.8 10.5  NEUTROABS 7.2  --   --   --   HGB 10.6*   < > 9.0* 8.9*  HCT 34.2*   < > 29.7* 29.1*  MCV 82.4   < > 82.7 83.1  PLT 329   < > 274 256   < > = values in this interval not displayed.    Basic Metabolic Panel:  Recent Labs  Lab 01/26/21 0332 01/27/21 0805  NA 139 139  K 3.3* 3.1*  CL 104 108  CO2 27 25  GLUCOSE 284* 68*  BUN 9 10  CREATININE 1.20* 1.17*  CALCIUM 8.4* 8.0*    Lipid Panel:     Component Value Date/Time   CHOL 374 (H) 01/26/2021 0332   TRIG 213 (H) 01/26/2021 0332   HDL 50 01/26/2021 0332   CHOLHDL 7.5 01/26/2021 0332   VLDL 43 (H) 01/26/2021 0332   LDLCALC 281 (H) 01/26/2021 0332   HgbA1c:  Lab Results  Component Value Date   HGBA1C 8.9 (H) 01/26/2021   Urine Drug Screen:     Component Value Date/Time   LABOPIA NONE DETECTED 01/27/2021 0855   COCAINSCRNUR NONE DETECTED 01/27/2021 0855   LABBENZ NONE DETECTED 01/27/2021 0855   AMPHETMU NONE DETECTED 01/27/2021 0855   THCU NONE DETECTED 01/27/2021 0855   LABBARB NONE DETECTED 01/27/2021 0855    Alcohol Level No results found for: [place]  CT Head Wo Contrast 01/25/2021 IMPRESSION:  Acute to subacute infarct centered in the right basal ganglia. No evidence of hemorrhagic transformation or significant mass effect.   MR ANGIO HEAD WO  CONTRAST 01/26/2021  MRI HEAD  IMPRESSION:  1. 2.6 cm acute to early subacute ischemic infarct involving the right basal ganglia. Associated mild petechial hemorrhage without frank hemorrhagic transformation.  2. Underlying moderate chronic microvascular ischemic disease.   MRA HEAD  IMPRESSION:  1. Motion degraded exam.  2. Negative intracranial MRA. No large vessel occlusion, hemodynamically significant stenosis, or other acute vascular abnormality.    Transthoracic Echocardiogram  00/00/2021 IMPRESSIONS  1. Left ventricular ejection fraction, by estimation, is 60 to 65%. The  left ventricle has normal function. The left ventricle has no regional  wall motion abnormalities. There is moderate left ventricular hypertrophy.  Left ventricular diastolic  parameters are consistent with Grade I diastolic dysfunction (impaired  relaxation). Elevated left ventricular end-diastolic pressure.  2. Right ventricular systolic function is normal. The right ventricular  size is normal.  3. A small pericardial effusion is present. The pericardial effusion is  circumferential. There is no evidence of cardiac tamponade.  4. The mitral valve is abnormal. Trivial mitral valve regurgitation.  5. The aortic valve is tricuspid.  Aortic valve regurgitation is not  visualized.  6. Mildly dilated pulmonary artery.  7. The inferior vena cava is normal in size with greater than 50%  respiratory variability, suggesting right atrial pressure of 3 mmHg.  8. Agitated saline contrast bubble study was negative, with no evidence  of any interatrial shunt.   Bilateral Carotid Dopplers  00/00/2021 Summary:  Right Carotid: The extracranial vessels were near-normal with only minimal wall thickening or plaque.  Left Carotid: The extracranial vessels were near-normal with only minimal wall thickening or plaque.  Vertebrals: Bilateral vertebral arteries demonstrate antegrade flow.  Subclavians: Normal flow  hemodynamics were seen in bilateral subclavian arteries.   ECG - SR rate 84 BPM. (See cardiology reading for complete details)  PHYSICAL EXAM  Temp:  [98 F (36.7 C)-98.7 F (37.1 C)] 98.3 F (36.8 C) (05/01 1527) Pulse Rate:  [78-86] 86 (05/01 1527) Resp:  [18-19] 18 (05/01 1527) BP: (118-196)/(76-129) 170/129 (05/01 1527) SpO2:  [99 %-100 %] 100 % (05/01 1527)  General - morbid obesity, well developed, in no apparent distress.  Ophthalmologic - fundi not visualized due to noncooperation.  Cardiovascular - Regular rhythm and rate.  Mental Status -  Level of arousal and orientation to time, place, and person were intact. Language including expression, naming, repetition, comprehension was assessed and found intact. Attention span and concentration were normal. Fund of Knowledge was assessed and was intact.  Cranial Nerves II - XII - II - Visual field intact OU. III, IV, VI - Extraocular movements intact. V - Facial sensation intact bilaterally. VII - left mild facial droop. VIII - Hearing & vestibular intact bilaterally. X - Palate elevates symmetrically. XI - Chin turning & shoulder shrug intact bilaterally. XII - Tongue protrusion intact.  Motor Strength - The patient's strength was normal in all extremities and pronator drift was absent except slight left finger grip weakness.  Bulk was normal and fasciculations were absent.   Motor Tone - Muscle tone was assessed at the neck and appendages and was normal.  Reflexes - The patient's reflexes were symmetrical in all extremities and she had no pathological reflexes.  Sensory - Light touch, temperature/pinprick were assessed and were symmetrical.    Coordination - The patient had normal movements in the hands with no ataxia or dysmetria.  Tremor was absent.  Gait and Station - deferred.    ASSESSMENT/PLAN Ms. Sherri Dixon is a 40 y.o. female with history of DM2, GERD, PE on Xarelto, HLD who presents with nausea and  headache x 3 days with facial droop since 4/23. (pt reported hx of atrial fibrillation but this could not be confirmed) She did not receive IV t-PA due to late presentation (>4.5 hours from time of onset) and recent anticoagulation.   Stroke: large right BG infarct - embolic vs small vessel disease.  CT head - Acute to subacute infarct centered in the right basal ganglia.  MRI head - 2.6 cm acute to early subacute ischemic infarct involving the right basal ganglia. Associated mild petechial hemorrhage without frank hemorrhagic transformation.  MRA head - Motion degraded exam. Negative intracranial MRA.  Carotid Doppler - near normal  2D Echo - EF 60 - 65%. No cardiac source of emboli identified.   Sars Corona Virus 2 - negative  LDL - 281  HgbA1c - 8.9  UDS - negative  Hypercoagulable work up pending  VTE prophylaxis - heparin IV  Xarelto (rivaroxaban) daily prior to admission, now on heparin IV.  Plan to switch back to  Xarelto tomorrow.  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 Long-term BP goal normotensive  Hyperlipidemia  Home Lipid lowering medication: Zocor 20 mg daily  LDL 281, goal < 70  Current lipid lowering medication: Crestor 40 mg daily  Continue statin at discharge  Diabetes, uncontrolled  Home diabetic meds: insulin  Current diabetic meds: insulin  HgbA1c 8.9, goal < 7.0  CBGs  SSI  Need DM education and close PCP follow up  Other Stroke Risk Factors  ETOH use,  advised to drink no more than 1 alcoholic beverage per day.  Morbid obesity, Body mass index is 55.75 kg/m., recommend weight loss, diet and exercise as appropriate   Other Active Problems, Findings, Recommendations and/or Plan  Code status - Full code Hypokalemia - 3.3 ->3.3->3.1 - supplement AKI - creatinine - 1.12->1.20->1.17 encourage po intake  Hospital day # 1  Rosalin Hawking, MD PhD Stroke Neurology 01/27/2021 7:39 PM    To contact Stroke Continuity provider, please refer to [email or web address] After hours, contact General Neurology

## 2021-01-27 NOTE — Progress Notes (Addendum)
PROGRESS NOTE                                                                                                                                                                                                             Patient Demographics:    Sherri Dixon, is a 40 y.o. female, DOB - [birth year], [id number]  Outpatient Primary MD for the patient is Tomasa Hose, NP    LOS - 1  Admit date - 01/25/2021    Chief Complaint  Patient presents with  . Headache  . Emesis       Brief Narrative (HPI from H&P)  - Sherri Dixon is a 40 y.o. female with history of hypertension, diabetes mellitus, CHF, asthma, history of PE who was recently admitted and discharged for CHF on December 15, 2020 last month presents to the ER with complaints of left-sided headache and left facial droop.  Patient has been having the symptoms for last 3 days and the left facial droop was noticed by patient's husband, she presented to the ER with very high blood pressure, work-up suggesting right basal ganglia moderate to large sized ischemic CVA, of note patient is on Xarelto for PEs but had stopped taking it for a week due to heavy menstrual periods.   Subjective:   Patient in bed, appears comfortable, denies any headache, no fever, no chest pain or pressure, no shortness of breath , no abdominal pain.  Improving left-sided facial droop and weakness.   Assessment  & Plan :     1. Acute right basal ganglia ischemic CVA.  Causing left-sided facial droop and weakness, case discussed with neurologist Dr. Beatriz Chancellor, symptoms seem to be at least 22 to 8 days old, gradually controlled blood pressure now, placed on high intensity statin as LDL is above goal, better glycemic control as A1c is above goal.  Protocol, may require placement depending on her progress.  Of note she is on Xarelto at home which she was not taking for a week, this is for PE, due to her size of acute  CVA has decided that patient will be started on nonbullous heparin drip on 01/26/2021 with close monitoring.     2.  History of PE.  See discussion above with anticoagulation.  Eventually resume Xarelto with better compliance upon discharge.  3.  Morbid obesity.  BMI  55.  Follow with PCP for weight loss  4.  Dyslipidemia.  LDL above goal placed on high intensity statin.  5.  Hypertension.  Gradually controlled placed on Coreg twice daily, add ACE.  6.  Chronic microcytic anemia.  Follow with PCP no acute issues likely due to heavy menstrual periods.  Defer to PCP.  7.  Significant proteinuria only with hypoalbuminemia - Case discussed the case with spoke with nephrologist Dr. Carolin Sicks, outpt patient follow-up, weight loss and tight control of hypertension along with diabetes mellitus.  Will check renal ultrasound.  8. DM type II.  Poor control outpatient due to hyperglycemia, on Lantus and sliding scale will monitor & adjust, DM and insulin education.  Lab Results  Component Value Date   HGBA1C 8.9 (H) 01/26/2021   CBG (last 3)  Recent Labs    01/26/21 1647 01/26/21 2135 01/27/21 0708  GLUCAP 331* 141* 93         Condition -  Guarded  Family Communication  : Husband Mr. Mozetta Wadding [phone] on 01/26/2021 message left at 06/28/1971  Code Status :  Full  Consults  :  Neuro  PUD Prophylaxis : None   Procedures  :     MRI/A - : MRI HEAD IMPRESSION: 1. 2.6 cm acute to early subacute ischemic infarct involving the right basal ganglia. Associated mild petechial hemorrhage without frank hemorrhagic transformation. 2. Underlying moderate chronic microvascular ischemic disease. MRA HEAD IMPRESSION: 1. Motion degraded exam. 2. Negative intracranial MRA. No large vessel occlusion, hemodynamically significant stenosis, or other acute vascular abnormality.   TTE      Disposition Plan  :    Status is: Observation  Dispo: The patient is from: Home              Anticipated d/c  is to: TBD              Patient currently is not medically stable to d/c.   Difficult to place patient No  DVT Prophylaxis  :    Hep gtt    Lab Results  Component Value Date   PLT 256 01/27/2021    Diet :  Diet Order            Diet Carb Modified Fluid consistency: Thin; Room service appropriate? Yes  Diet effective now                  Inpatient Medications  Scheduled Meds: . carvedilol  6.25 mg Oral BID WC  . insulin aspart  0-15 Units Subcutaneous TID WC  . insulin aspart  0-5 Units Subcutaneous QHS  . insulin glargine  25 Units Subcutaneous BID  . lisinopril  5 mg Oral Daily  . potassium chloride  40 mEq Oral Once  . rosuvastatin  40 mg Oral Daily   Continuous Infusions: . heparin Stopped (01/27/21 0817)   PRN Meds:.acetaminophen **OR** [DISCONTINUED] acetaminophen (TYLENOL) oral liquid 160 mg/5 mL **OR** [DISCONTINUED] acetaminophen, hydrALAZINE  Antibiotics  :    Anti-infectives (From admission, onward)   None       Time Spent in minutes  30   Lala Lund M.D on 01/27/2021 at 11:31 AM  To page go to [email or web address]   [facility] -  Office  [phone]    See all Orders from today for further details    Objective:   Vitals:   01/26/21 1616 01/26/21 2032 01/26/21 2322 01/27/21 0337  BP: (!) 160/109 (!) 196/94 118/76 (!) 161/84  Pulse: 78 78 78 80  Resp: 19  19 18   Temp: 98.3 F (36.8 C) 98.7 F (37.1 C) 98.3 F (36.8 C) 98 F (36.7 C)  TempSrc: Oral Oral Oral Oral  SpO2: 99% 100% 100% 99%  Weight:      Height:        Wt Readings from Last 3 Encounters:  01/25/21 (!) 152 kg  12/15/20 (!) 151.1 kg  07/28/19 122.4 kg     Intake/Output Summary (Last 24 hours) at 01/27/2021 1131 Last data filed at 01/27/2021 0702 Gross per 24 hour  Intake 191.96 ml  Output 800 ml  Net -608.04 ml     Physical Exam  Awake Alert, No new F.N deficits, does have left-sided weakness and facial droop [place].AT,PERRAL Supple Neck,No JVD, No  cervical lymphadenopathy appriciated.  Symmetrical Chest wall movement, Good air movement bilaterally, CTAB RRR,No Gallops, Rubs or new Murmurs, No Parasternal Heave +ve B.Sounds, Abd Soft, No tenderness, No organomegaly appriciated, No rebound - guarding or rigidity. No Cyanosis, trace edema     Data Review:    CBC Recent Labs  Lab 01/25/21 1457 01/25/21 2113 01/26/21 0332 01/27/21 0300  WBC 11.5* 9.2 9.8 10.5  HGB 10.6* 9.3* 9.0* 8.9*  HCT 34.2* 30.7* 29.7* 29.1*  PLT 329 260 274 256  MCV 82.4 82.1 82.7 83.1  MCH 25.5* 24.9* 25.1* 25.4*  MCHC 31.0 30.3 30.3 30.6  RDW 15.9* 15.9* 15.9* 15.9*  LYMPHSABS 3.2  --   --   --   MONOABS 0.7  --   --   --   EOSABS 0.2  --   --   --   BASOSABS 0.1  --   --   --     Recent Labs  Lab 01/25/21 1457 01/25/21 1804 01/25/21 2113 01/26/21 0332 01/27/21 0805  NA 136  --   --  139 139  K 3.3*  --   --  3.3* 3.1*  CL 106  --   --  104 108  CO2 24  --   --  27 25  GLUCOSE 272*  --   --  284* 68*  BUN 12  --   --  9 10  CREATININE 1.10*  --  1.12* 1.20* 1.17*  CALCIUM 8.6*  --   --  8.4* 8.0*  AST  --   --   --  16  --   ALT  --   --   --  12  --   ALKPHOS  --   --   --  73  --   BILITOT  --   --   --  0.4  --   ALBUMIN  --   --   --  1.6*  --   INR  --  1.0  --   --   --   HGBA1C  --   --   --  8.9*  --     ------------------------------------------------------------------------------------------------------------------ Recent Labs    01/26/21 0332  CHOL 374*  HDL 50  LDLCALC 281*  TRIG 213*  CHOLHDL 7.5    Lab Results  Component Value Date   HGBA1C 8.9 (H) 01/26/2021   ------------------------------------------------------------------------------------------------------------------ No results for input(s): TSH, T4TOTAL, T3FREE, THYROIDAB in the last 72 hours.  Invalid input(s): FREET3  Cardiac Enzymes No results for input(s): CKMB, TROPONINI, MYOGLOBIN in the last 168 hours.  Invalid input(s):  CK ------------------------------------------------------------------------------------------------------------------    Component Value Date/Time   BNP 294.8 (H) 12/12/2020 1823    Micro Results Recent Results (from  the past 240 hour(s))  Resp Panel by RT-PCR (Flu A&B, Covid) Nasopharyngeal Swab     Status: None   Collection Time: 01/25/21  6:38 PM   Specimen: Nasopharyngeal Swab; Nasopharyngeal(NP) swabs in vial transport medium  Result Value Ref Range Status   SARS Coronavirus 2 by RT PCR NEGATIVE NEGATIVE Final    Comment: (NOTE) SARS-CoV-2 target nucleic acids are NOT DETECTED.  The SARS-CoV-2 RNA is generally detectable in upper respiratory specimens during the acute phase of infection. The lowest concentration of SARS-CoV-2 viral copies this assay can detect is 138 copies/mL. A negative result does not preclude SARS-Cov-2 infection and should not be used as the sole basis for treatment or other patient management decisions. A negative result may occur with  improper specimen collection/handling, submission of specimen other than nasopharyngeal swab, presence of viral mutation(s) within the areas targeted by this assay, and inadequate number of viral copies(<138 copies/mL). A negative result must be combined with clinical observations, patient history, and epidemiological information. The expected result is Negative.  Fact Sheet for Patients:  EntrepreneurPulse.com.au  Fact Sheet for Healthcare Providers:  IncredibleEmployment.be  This test is no t yet approved or cleared by the [place] FDA and  has been authorized for detection and/or diagnosis of SARS-CoV-2 by FDA under an Emergency Use Authorization (EUA). This EUA will remain  in effect (meaning this test can be used) for the duration of the COVID-19 declaration under Section 564(b)(1) of the Act, 21 U.S.C.section 360bbb-3(b)(1), unless the authorization is terminated  or  revoked sooner.       Influenza A by PCR NEGATIVE NEGATIVE Final   Influenza B by PCR NEGATIVE NEGATIVE Final    Comment: (NOTE) The Xpert Xpress SARS-CoV-2/FLU/RSV plus assay is intended as an aid in the diagnosis of influenza from Nasopharyngeal swab specimens and should not be used as a sole basis for treatment. Nasal washings and aspirates are unacceptable for Xpert Xpress SARS-CoV-2/FLU/RSV testing.  Fact Sheet for Patients: EntrepreneurPulse.com.au  Fact Sheet for Healthcare Providers: IncredibleEmployment.be  This test is not yet approved or cleared by the [place] FDA and has been authorized for detection and/or diagnosis of SARS-CoV-2 by FDA under an Emergency Use Authorization (EUA). This EUA will remain in effect (meaning this test can be used) for the duration of the COVID-19 declaration under Section 564(b)(1) of the Act, 21 U.S.C. section 360bbb-3(b)(1), unless the authorization is terminated or revoked.  Performed at [facility], [place] [address], [place], [place] [address]     Radiology Reports CT Head Wo Contrast  Result Date: 01/25/2021 CLINICAL DATA:  Nausea, emesis, headache for 3 days EXAM: CT HEAD WITHOUT CONTRAST TECHNIQUE: Contiguous axial images were obtained from the base of the skull through the vertex without intravenous contrast. COMPARISON:  None. FINDINGS: Brain: Hypodensity centered in the right basal ganglia measuring approximately 3.2 x 1.9 cm most consistent with acute to subacute infarct. No significant mass effect. No evidence of acute hemorrhage. The lateral ventricles and remaining midline structures are unremarkable. There are no acute extra-axial fluid collections. Vascular: No hyperdense vessel or unexpected calcification. Skull: Normal. Negative for fracture or focal lesion. Sinuses/Orbits: Mild mucosal thickening within the frontal and right maxillary sinuses. Other: None.  IMPRESSION: 1. 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 No evidence of hemorrhagic transformation or significant mass effect. Critical Value/emergent results were called by telephone at the time of interpretation on 01/25/2021 at 5:50 pm to provider Deno Etienne , who verbally acknowledged  these results. Electronically Signed   By: Randa Ngo M.D.   On: 01/25/2021 17:53   MR ANGIO HEAD WO CONTRAST  Result Date: 01/26/2021 CLINICAL DATA:  Follow-up examination for acute stroke. EXAM: MRI HEAD WITHOUT CONTRAST MRA HEAD WITHOUT CONTRAST TECHNIQUE: Multiplanar, multiecho pulse sequences of the brain and surrounding structures were obtained without intravenous contrast. Angiographic images of the head were obtained using MRA technique without contrast. COMPARISON:  Prior head CT from 01/25/2021. FINDINGS: MRI HEAD FINDINGS Brain: Cerebral volume within normal limits. Scattered T2/FLAIR hyperintensities involving the periventricular, deep, and subcortical white matter both cerebral hemispheres, most likely related chronic microvascular ischemic disease, moderate in nature for age. 2.6 cm focus of restricted diffusion involving the right basal ganglia consistent with an acute to early subacute ischemic infarct. Mild localized edema without significant regional mass effect. Associated mild petechial hemorrhage without frank hemorrhagic transformation. Partial extension into the right cerebral peduncle and midbrain (series 5, image 68). No other acute or subacute ischemia. Gray-white matter differentiation otherwise maintained. No other areas of remote cortical infarction. No other evidence for acute or chronic intracranial hemorrhage. No mass lesion, midline shift or mass effect. No extra-axial fluid collection. Pituitary gland suprasellar region within normal limits. Midline structures intact. Vascular: Major intracranial vascular flow voids are maintained. Skull and upper cervical spine:  Craniocervical junction within normal limits. Bone marrow signal intensity normal. No focal marrow replacing lesion. No scalp soft tissue abnormality. Sinuses/Orbits: Globes and orbital soft tissues demonstrate no acute finding. Scattered mucosal thickening noted throughout the frontoethmoidal and maxillary sinuses. Left mastoid effusion noted, of doubtful significance in the acute setting. Inner ear structures grossly normal. Other: None. MRA HEAD FINDINGS ANTERIOR CIRCULATION: Examination degraded by motion artifact. Distal cervical segments of the internal carotid arteries are patent with antegrade flow. Distal cervical left ICA tortuous. Petrous, cavernous, and supraclinoid segments patent without definite stenosis or other abnormality. A1 segments patent bilaterally. Normal anterior communicating artery complex. Anterior cerebral arteries patent to their distal aspects without stenosis. No visible M1 stenosis or occlusion. Negative MCA bifurcations. Distal MCA branches well perfused and symmetric. POSTERIOR CIRCULATION: Visualized distal cervical segments of the internal carotid arteries are patent with antegrade flow. Left vertebral artery slightly dominant. Left PICA origin patent and normal. Right PICA not definitely seen. Basilar patent to its distal aspect without appreciable stenosis. Superior cerebellar arteries patent bilaterally. Both PCA supplied via the basilar as well as small bilateral posterior communicating arteries. PCAs perfused to their distal aspects without appreciable stenosis. No intracranial aneurysm on this motion degraded exam. IMPRESSION: MRI HEAD IMPRESSION: 1. 2.6 cm acute to early subacute ischemic infarct involving the right basal ganglia. Associated mild petechial hemorrhage without frank hemorrhagic transformation. 2. Underlying moderate chronic microvascular ischemic disease. MRA HEAD IMPRESSION: 1. Motion degraded exam. 2. Negative intracranial MRA. No large vessel occlusion,  hemodynamically significant stenosis, or other acute vascular abnormality. Electronically Signed   By: Jeannine Boga M.D.   On: 01/26/2021 04:11   MR BRAIN WO CONTRAST  Result Date: 01/26/2021 CLINICAL DATA:  Follow-up examination for acute stroke. EXAM: MRI HEAD WITHOUT CONTRAST MRA HEAD WITHOUT CONTRAST TECHNIQUE: Multiplanar, multiecho pulse sequences of the brain and surrounding structures were obtained without intravenous contrast. Angiographic images of the head were obtained using MRA technique without contrast. COMPARISON:  Prior head CT from 01/25/2021. FINDINGS: MRI HEAD FINDINGS Brain: Cerebral volume within normal limits. Scattered T2/FLAIR hyperintensities involving the periventricular, deep, and subcortical white matter both cerebral hemispheres, most likely related chronic microvascular ischemic  disease, moderate in nature for age. 2.6 cm focus of restricted diffusion involving the right basal ganglia consistent with an acute to early subacute ischemic infarct. Mild localized edema without significant regional mass effect. Associated mild petechial hemorrhage without frank hemorrhagic transformation. Partial extension into the right cerebral peduncle and midbrain (series 5, image 68). No other acute or subacute ischemia. Gray-white matter differentiation otherwise maintained. No other areas of remote cortical infarction. No other evidence for acute or chronic intracranial hemorrhage. No mass lesion, midline shift or mass effect. No extra-axial fluid collection. Pituitary gland suprasellar region within normal limits. Midline structures intact. Vascular: Major intracranial vascular flow voids are maintained. Skull and upper cervical spine: Craniocervical junction within normal limits. Bone marrow signal intensity normal. No focal marrow replacing lesion. No scalp soft tissue abnormality. Sinuses/Orbits: Globes and orbital soft tissues demonstrate no acute finding. Scattered mucosal  thickening noted throughout the frontoethmoidal and maxillary sinuses. Left mastoid effusion noted, of doubtful significance in the acute setting. Inner ear structures grossly normal. Other: None. MRA HEAD FINDINGS ANTERIOR CIRCULATION: Examination degraded by motion artifact. Distal cervical segments of the internal carotid arteries are patent with antegrade flow. Distal cervical left ICA tortuous. Petrous, cavernous, and supraclinoid segments patent without definite stenosis or other abnormality. A1 segments patent bilaterally. Normal anterior communicating artery complex. Anterior cerebral arteries patent to their distal aspects without stenosis. No visible M1 stenosis or occlusion. Negative MCA bifurcations. Distal MCA branches well perfused and symmetric. POSTERIOR CIRCULATION: Visualized distal cervical segments of the internal carotid arteries are patent with antegrade flow. Left vertebral artery slightly dominant. Left PICA origin patent and normal. Right PICA not definitely seen. Basilar patent to its distal aspect without appreciable stenosis. Superior cerebellar arteries patent bilaterally. Both PCA supplied via the basilar as well as small bilateral posterior communicating arteries. PCAs perfused to their distal aspects without appreciable stenosis. No intracranial aneurysm on this motion degraded exam. IMPRESSION: MRI HEAD IMPRESSION: 1. 2.6 cm acute to early subacute ischemic infarct involving the right basal ganglia. Associated mild petechial hemorrhage without frank hemorrhagic transformation. 2. Underlying moderate chronic microvascular ischemic disease. MRA HEAD IMPRESSION: 1. Motion degraded exam. 2. Negative intracranial MRA. No large vessel occlusion, hemodynamically significant stenosis, or other acute vascular abnormality. Electronically Signed   By: Jeannine Boga M.D.   On: 01/26/2021 04:11   ECHOCARDIOGRAM COMPLETE BUBBLE STUDY  Result Date: 01/26/2021    ECHOCARDIOGRAM REPORT    Patient Name:   KELLSEY SANSONE Date of Exam: 01/26/2021 Medical Rec #:  [id number]     Height:       65.0 in Accession #:    [id number]    Weight:       335.0 lb Date of Birth:  1981/07/30      BSA:          2.463 m Patient Age:    58 years      BP:           125/70 mmHg Patient Gender: F             HR:           80 bpm. Exam Location:  Inpatient Procedure: 2D Echo, Cardiac Doppler, Color Doppler, Strain Analysis and Saline            Contrast Bubble Study Indications:    Stroke 434.19/IC3.9  History:        Patient has prior history of Echocardiogram examinations, most  recent 12/14/2020. CHF; Risk Factors:Diabetes, Dyslipidemia and                 Morbid obesity.  Sonographer:    Merrie Roof Referring Phys: [id number] [place]  1. Left ventricular ejection fraction, by estimation, is 60 to 65%. The left ventricle has normal function. The left ventricle has no regional wall motion abnormalities. There is moderate left ventricular hypertrophy. Left ventricular diastolic parameters are consistent with Grade I diastolic dysfunction (impaired relaxation). Elevated left ventricular end-diastolic pressure.  2. Right ventricular systolic function is normal. The right ventricular size is normal.  3. A small pericardial effusion is present. The pericardial effusion is circumferential. There is no evidence of cardiac tamponade.  4. The mitral valve is abnormal. Trivial mitral valve regurgitation.  5. The aortic valve is tricuspid. Aortic valve regurgitation is not visualized.  6. Mildly dilated pulmonary artery.  7. The inferior vena cava is normal in size with greater than 50% respiratory variability, suggesting right atrial pressure of 3 mmHg.  8. Agitated saline contrast bubble study was negative, with no evidence of any interatrial shunt. Comparison(s): Changes from prior study are noted. 12/14/2020: LVEF 55-60%, small pericardial effusion without tamponade. FINDINGS  Left Ventricle: Left  ventricular ejection fraction, by estimation, is 60 to 65%. The left ventricle has normal function. The left ventricle has no regional wall motion abnormalities. The left ventricular internal cavity size was normal in size. There is  moderate left ventricular hypertrophy. Left ventricular diastolic parameters are consistent with Grade I diastolic dysfunction (impaired relaxation). Elevated left ventricular end-diastolic pressure. Right Ventricle: The right ventricular size is normal. No increase in right ventricular wall thickness. Right ventricular systolic function is normal. Left Atrium: Left atrial size was normal in size. Right Atrium: Right atrial size was normal in size. Pericardium: A small pericardial effusion is present. The pericardial effusion is circumferential. There is no evidence of cardiac tamponade. Mitral Valve: The mitral valve is abnormal. There is mild thickening of the mitral valve leaflet(s). Trivial mitral valve regurgitation. Tricuspid Valve: The tricuspid valve is grossly normal. Tricuspid valve regurgitation is trivial. Aortic Valve: The aortic valve is tricuspid. Aortic valve regurgitation is not visualized. Aortic valve mean gradient measures 6.0 mmHg. Aortic valve peak gradient measures 10.5 mmHg. Aortic valve area, by VTI measures 2.31 cm. Pulmonic Valve: The pulmonic valve was grossly normal. Pulmonic valve regurgitation is not visualized. Aorta: The aortic root and ascending aorta are structurally normal, with no evidence of dilitation. Pulmonary Artery: The pulmonary artery is mildly dilated. Venous: The inferior vena cava is normal in size with greater than 50% respiratory variability, suggesting right atrial pressure of 3 mmHg. IAS/Shunts: No atrial level shunt detected by color flow Doppler. Agitated saline contrast was given intravenously to evaluate for intracardiac shunting. Agitated saline contrast bubble study was negative, with no evidence of any interatrial shunt.  LEFT  VENTRICLE PLAX 2D LVIDd:         4.80 cm  Diastology LVIDs:         3.30 cm  LV e' medial:    5.98 cm/s LV PW:         1.50 cm  LV E/e' medial:  19.2 LV IVS:        1.30 cm  LV e' lateral:   6.85 cm/s LVOT diam:     1.90 cm  LV E/e' lateral: 16.8 LV SV:         72 LV SV Index:   29 LVOT Area:  2.84 cm  RIGHT VENTRICLE          IVC RV Basal diam:  3.40 cm  IVC diam: 1.60 cm LEFT ATRIUM             Index       RIGHT ATRIUM           Index LA diam:        4.50 cm 1.83 cm/m  RA Area:     19.30 cm LA Vol (A2C):   74.9 ml 30.41 ml/m RA Volume:   49.90 ml  20.26 ml/m LA Vol (A4C):   73.3 ml 29.76 ml/m LA Biplane Vol: 81.0 ml 32.89 ml/m  AORTIC VALVE AV Area (Vmax):    2.22 cm AV Area (Vmean):   2.13 cm AV Area (VTI):     2.31 cm AV Vmax:           162.00 cm/s AV Vmean:          115.500 cm/s AV VTI:            0.312 m AV Peak Grad:      10.5 mmHg AV Mean Grad:      6.0 mmHg LVOT Vmax:         127.00 cm/s LVOT Vmean:        86.950 cm/s LVOT VTI:          0.255 m LVOT/AV VTI ratio: 0.82  AORTA Ao Root diam: 2.70 cm Ao Asc diam:  3.00 cm MITRAL VALVE MV Area (PHT): 3.74 cm     SHUNTS MV Decel Time: 203 msec     Systemic VTI:  0.26 m MV E velocity: 115.00 cm/s  Systemic Diam: 1.90 cm MV A velocity: 122.50 cm/s MV E/A ratio:  0.94 Lyman Bishop MD Electronically signed by Lyman Bishop MD Signature Date/Time: 01/26/2021/12:08:11 PM    Final    VAS US CAROTID (at [facility] and [facility] only)  Result Date: 01/26/2021 Carotid Arterial Duplex Study Patient Name:  MENUCHA BOLZ  Date of Exam:   01/26/2021 Medical Rec #: [id number]      Accession #:    [id number] Date of Birth: 1981/07/30       Patient Gender: F Patient Age:   77Y Exam Location:  [facility] Procedure:      VAS US CAROTID Referring Phys: [id number] Doreatha Lew [facility] --------------------------------------------------------------------------------  Indications:       Facial weakness, headache. Elevated blood pressure. Risk Factors:      Hypertension,  hyperlipidemia, Diabetes. Other Factors:     Morbid obesity. CHF. Comparison Study:  No prior study on file Performing Technologist: Sharion Dove RVS  Examination Guidelines: A complete evaluation includes B-mode imaging, spectral Doppler, color Doppler, and power Doppler as needed of all accessible portions of each vessel. Bilateral testing is considered an integral part of a complete examination. Limited examinations for reoccurring indications may be performed as noted.  Right Carotid Findings: +----------+--------+--------+--------+------------------+------------------+           PSV cm/sEDV cm/sStenosisPlaque DescriptionComments           +----------+--------+--------+--------+------------------+------------------+ CCA Prox  101     17                                intimal thickening +----------+--------+--------+--------+------------------+------------------+ CCA Distal74      17  intimal thickening +----------+--------+--------+--------+------------------+------------------+ ICA Prox  51      15                                                   +----------+--------+--------+--------+------------------+------------------+ ICA Distal72      23                                                   +----------+--------+--------+--------+------------------+------------------+ ECA       117     14                                                   +----------+--------+--------+--------+------------------+------------------+ +----------+--------+-------+--------+-------------------+           PSV cm/sEDV cmsDescribeArm Pressure (mmHG) +----------+--------+-------+--------+-------------------+ Subclavian114     14                                 +----------+--------+-------+--------+-------------------+ +---------+--------+--+--------+--+ VertebralPSV cm/s66EDV cm/s18 +---------+--------+--+--------+--+  Left Carotid Findings:  +----------+--------+--------+--------+------------------+------------------+           PSV cm/sEDV cm/sStenosisPlaque DescriptionComments           +----------+--------+--------+--------+------------------+------------------+ CCA Prox  108     16                                intimal thickening +----------+--------+--------+--------+------------------+------------------+ CCA Distal85      20                                intimal thickening +----------+--------+--------+--------+------------------+------------------+ ICA Prox  76      27                                                   +----------+--------+--------+--------+------------------+------------------+ ICA Distal84      31                                                   +----------+--------+--------+--------+------------------+------------------+ ECA       72      7                                                    +----------+--------+--------+--------+------------------+------------------+ +----------+--------+--------+--------+-------------------+           PSV cm/sEDV cm/sDescribeArm Pressure (mmHG) +----------+--------+--------+--------+-------------------+ [id number]                                         +----------+--------+--------+--------+-------------------+ +---------+--------+--+--------+--+  VertebralPSV cm/s95EDV cm/s26 +---------+--------+--+--------+--+   Summary: Right Carotid: The extracranial vessels were near-normal with only minimal wall                thickening or plaque. Left Carotid: The extracranial vessels were near-normal with only minimal wall               thickening or plaque. Vertebrals:  Bilateral vertebral arteries demonstrate antegrade flow. Subclavians: Normal flow hemodynamics were seen in bilateral subclavian              arteries. *See table(s) above for measurements and observations.     Preliminary

## 2021-01-27 NOTE — Plan of Care (Signed)
  Problem: Education: Goal: Knowledge of disease or condition will improve Outcome: Progressing Goal: Knowledge of secondary prevention will improve Outcome: Progressing Goal: Knowledge of patient specific risk factors addressed and post discharge goals established will improve Outcome: Progressing   Problem: Coping: Goal: Will verbalize positive feelings about self Outcome: Progressing Goal: Will identify appropriate support needs Outcome: Progressing   Problem: Education: Goal: Knowledge of General Education information will improve Description: Including pain rating scale, medication(s)/side effects and non-pharmacologic comfort measures Outcome: Progressing   Problem: Health Behavior/Discharge Planning: Goal: Ability to manage health-related needs will improve Outcome: Progressing   Problem: Clinical Measurements: Goal: Ability to maintain clinical measurements within normal limits will improve Outcome: Progressing Goal: Will remain free from infection Outcome: Progressing Goal: Diagnostic test results will improve Outcome: Progressing Goal: Respiratory complications will improve Outcome: Progressing Goal: Cardiovascular complication will be avoided Outcome: Progressing   Problem: Activity: Goal: Risk for activity intolerance will decrease Outcome: Progressing   Problem: Nutrition: Goal: Adequate nutrition will be maintained Outcome: Progressing   Problem: Coping: Goal: Level of anxiety will decrease Outcome: Progressing   Problem: Elimination: Goal: Will not experience complications related to bowel motility Outcome: Progressing Goal: Will not experience complications related to urinary retention Outcome: Progressing   Problem: Pain Managment: Goal: General experience of comfort will improve Outcome: Progressing   Problem: Safety: Goal: Ability to remain free from injury will improve Outcome: Progressing   Problem: Skin Integrity: Goal: Risk for  impaired skin integrity will decrease Outcome: Progressing   

## 2021-01-28 LAB — CBC WITH DIFFERENTIAL/PLATELET
Abs Immature Granulocytes: 0.03 10*3/uL (ref 0.00–0.07)
Basophils Absolute: 0 10*3/uL (ref 0.0–0.1)
Basophils Relative: 0 %
Eosinophils Absolute: 0.4 10*3/uL (ref 0.0–0.5)
Eosinophils Relative: 4 %
HCT: 27.4 % — ABNORMAL LOW (ref 36.0–46.0)
Hemoglobin: 8.5 g/dL — ABNORMAL LOW (ref 12.0–15.0)
Immature Granulocytes: 0 %
Lymphocytes Relative: 31 %
Lymphs Abs: 3.7 10*3/uL (ref 0.7–4.0)
MCH: 25.2 pg — ABNORMAL LOW (ref 26.0–34.0)
MCHC: 31 g/dL (ref 30.0–36.0)
MCV: 81.3 fL (ref 80.0–100.0)
Monocytes Absolute: 0.8 10*3/uL (ref 0.1–1.0)
Monocytes Relative: 7 %
Neutro Abs: 6.9 10*3/uL (ref 1.7–7.7)
Neutrophils Relative %: 58 %
Platelets: 249 10*3/uL (ref 150–400)
RBC: 3.37 MIL/uL — ABNORMAL LOW (ref 3.87–5.11)
RDW: 16 % — ABNORMAL HIGH (ref 11.5–15.5)
WBC: 11.9 10*3/uL — ABNORMAL HIGH (ref 4.0–10.5)
nRBC: 0 % (ref 0.0–0.2)

## 2021-01-28 LAB — COMPREHENSIVE METABOLIC PANEL
ALT: 12 U/L (ref 0–44)
AST: 18 U/L (ref 15–41)
Albumin: 1.3 g/dL — ABNORMAL LOW (ref 3.5–5.0)
Alkaline Phosphatase: 62 U/L (ref 38–126)
Anion gap: 6 (ref 5–15)
BUN: 9 mg/dL (ref 6–20)
CO2: 25 mmol/L (ref 22–32)
Calcium: 7.9 mg/dL — ABNORMAL LOW (ref 8.9–10.3)
Chloride: 108 mmol/L (ref 98–111)
Creatinine, Ser: 1.19 mg/dL — ABNORMAL HIGH (ref 0.44–1.00)
GFR, Estimated: 59 mL/min — ABNORMAL LOW (ref >60)
Glucose, Bld: 61 mg/dL — ABNORMAL LOW (ref 70–99)
Potassium: 3 mmol/L — ABNORMAL LOW (ref 3.5–5.1)
Sodium: 139 mmol/L (ref 135–145)
Total Bilirubin: 0.3 mg/dL (ref 0.3–1.2)
Total Protein: 4.2 g/dL — ABNORMAL LOW (ref 6.5–8.1)

## 2021-01-28 LAB — GLUCOSE, CAPILLARY
Glucose-Capillary: 72 mg/dL (ref 70–99)
Glucose-Capillary: 128 mg/dL — ABNORMAL HIGH (ref 70–99)

## 2021-01-28 LAB — BETA-2-GLYCOPROTEIN I ABS, IGG/M/A
Beta-2 Glyco I IgG: <9 GPI IgG units (ref 0–20)
Beta-2-Glycoprotein I IgA: <9 GPI IgA units (ref 0–25)
Beta-2-Glycoprotein I IgM: <9 GPI IgM units (ref 0–32)

## 2021-01-28 LAB — HOMOCYSTEINE: Homocysteine: 33.2 umol/L — ABNORMAL HIGH (ref 0.0–14.5)

## 2021-01-28 LAB — HEPARIN LEVEL (UNFRACTIONATED): Heparin Unfractionated: 0.55 IU/mL (ref 0.30–0.70)

## 2021-01-28 LAB — BRAIN NATRIURETIC PEPTIDE: B Natriuretic Peptide: 365.6 pg/mL — ABNORMAL HIGH (ref 0.0–100.0)

## 2021-01-28 LAB — APTT: aPTT: 116 seconds — ABNORMAL HIGH (ref 24–36)

## 2021-01-28 LAB — MAGNESIUM: Magnesium: 1.6 mg/dL — ABNORMAL LOW (ref 1.7–2.4)

## 2021-01-28 MED ORDER — POTASSIUM CHLORIDE CRYS ER 20 MEQ PO TBCR: 20.0000 meq | EXTENDED_RELEASE_TABLET | Freq: Once | ORAL | Status: DC

## 2021-01-28 MED ORDER — FUROSEMIDE 40 MG PO TABS
40.0000 mg | ORAL_TABLET | Freq: Once | ORAL | Status: AC
  Administered 2021-01-28: 40 mg via ORAL
  Filled 2021-01-28: qty 1

## 2021-01-28 MED ORDER — IRBESARTAN 75 MG PO TABS
37.5000 mg | ORAL_TABLET | Freq: Every day | ORAL | Status: DC
  Administered 2021-01-28: 37.5 mg via ORAL
  Filled 2021-01-28: qty 0.5

## 2021-01-28 MED ORDER — RIVAROXABAN 20 MG PO TABS
20.0000 mg | ORAL_TABLET | Freq: Every day | ORAL | Status: DC
  Administered 2021-01-28: 20 mg via ORAL
  Filled 2021-01-28: qty 1

## 2021-01-28 MED ORDER — ROSUVASTATIN CALCIUM 40 MG PO TABS: 40.0000 mg | ORAL_TABLET | Freq: Every day | ORAL | 0 refills | Status: DC

## 2021-01-28 MED ORDER — MAGNESIUM SULFATE 2 GM/50ML IV SOLN
2.0000 g | Freq: Once | INTRAVENOUS | Status: AC
  Administered 2021-01-28: 2 g via INTRAVENOUS
  Filled 2021-01-28: qty 50

## 2021-01-28 MED ORDER — POTASSIUM CHLORIDE CRYS ER 20 MEQ PO TBCR
40.0000 meq | EXTENDED_RELEASE_TABLET | Freq: Once | ORAL | Status: AC
  Administered 2021-01-28: 40 meq via ORAL
  Filled 2021-01-28: qty 2

## 2021-01-28 MED ORDER — OLMESARTAN MEDOXOMIL 40 MG PO TABS: 40.0000 mg | ORAL_TABLET | Freq: Every day | ORAL | 2 refills | Status: DC

## 2021-01-28 NOTE — Plan of Care (Signed)
  Problem: Education: Goal: Knowledge of disease or condition will improve Outcome: Progressing Goal: Knowledge of secondary prevention will improve Outcome: Progressing Goal: Knowledge of patient specific risk factors addressed and post discharge goals established will improve Outcome: Progressing   Problem: Coping: Goal: Will verbalize positive feelings about self Outcome: Progressing Goal: Will identify appropriate support needs Outcome: Progressing   Problem: Education: Goal: Knowledge of General Education information will improve Description: Including pain rating scale, medication(s)/side effects and non-pharmacologic comfort measures Outcome: Progressing   Problem: Health Behavior/Discharge Planning: Goal: Ability to manage health-related needs will improve Outcome: Progressing   Problem: Clinical Measurements: Goal: Ability to maintain clinical measurements within normal limits will improve Outcome: Progressing Goal: Will remain free from infection Outcome: Progressing Goal: Diagnostic test results will improve Outcome: Progressing Goal: Respiratory complications will improve Outcome: Progressing Goal: Cardiovascular complication will be avoided Outcome: Progressing   Problem: Activity: Goal: Risk for activity intolerance will decrease Outcome: Progressing   Problem: Nutrition: Goal: Adequate nutrition will be maintained Outcome: Progressing   Problem: Coping: Goal: Level of anxiety will decrease Outcome: Progressing   Problem: Elimination: Goal: Will not experience complications related to bowel motility Outcome: Progressing Goal: Will not experience complications related to urinary retention Outcome: Progressing   Problem: Pain Managment: Goal: General experience of comfort will improve Outcome: Progressing   Problem: Safety: Goal: Ability to remain free from injury will improve Outcome: Progressing   Problem: Skin Integrity: Goal: Risk for  impaired skin integrity will decrease Outcome: Progressing

## 2021-01-28 NOTE — Discharge Summary (Signed)
Sherri Dixon [id number] DOB: March 13, 1981 DOA: 01/25/2021  PCP: Tomasa Hose, NP  Admit date: 01/25/2021  Discharge date: 01/28/2021  Admitted From: Home   Disposition:  Home   Recommendations for Outpatient Follow-up:   Follow up with PCP in 1-2 weeks  PCP Please obtain BMP/CBC, 2 view CXR in 1week,  (see Discharge instructions)   PCP Please follow up on the following pending results: CBC, CMP, UA, Magnesium, CBGs, needs outpatient nephrology and neurology follow-up.   Home Health: PT-OT   Equipment/Devices: None  Consultations: Neuro Discharge Condition: Stable    CODE STATUS: Full    Diet Recommendation: Heart Healthy Low Carb    Chief Complaint  Patient presents with  . Headache  . Emesis     Brief history of present illness from the day of admission and additional interim summary    Sherri Dixon a 40 y.o.femalewithhistory of hypertension, diabetes mellitus, CHF, asthma, history of PE who was recently admitted and discharged for CHF on December 15, 2020 last month presents to the ER with complaints of left-sided headache and left facial droop. Patient has been having the symptoms for last 3 days and the left facial droop was noticed by patient's husband, she presented to the ER with very high blood pressure, work-up suggesting right basal ganglia moderate to large sized ischemic CVA, of note patient is on Xarelto for PEs but had stopped taking it for a week due to heavy menstrual periods.                                                                 Hospital Course    1. Acute right basal ganglia ischemic CVA.  Causing left-sided facial droop and weakness, case discussed with neurologist Dr. Erlinda Hong, symptoms are at least 42 to 8 days old the time of presentation, she was switched to high  intensity statin for better LDL control, received diabetic and insulin education for better A1c control, she was noncompliant with Xarelto and was counseled for that, initially kept on heparin drip for 2 days now switched back to Xarelto along with high intensity statin.  She does not want placement but wants home PT OT which will be ordered upon discharge, she does not want to stay any further in the hospital as she has a cold appointment to keep.  She will be discharged with close outpatient monitoring with PCP for secondary risk factor modification along with neurology follow-up in 7 to 10 days post discharge.  2.  History of PE.  Back on Xarelto now, counseled on compliance.  3.  Morbid obesity.  BMI 55.  Follow with PCP for weight loss  4.  Dyslipidemia.  LDL above goal placed on high intensity statin.  5.  Hypertension.    Blood pressure medications adjusted upon discharge  PCP to monitor well-controlled.  6.  Chronic microcytic anemia.  Follow with PCP no acute issues likely due to heavy menstrual periods.  Defer to PCP.  7.  Significant proteinuria only with hypoalbuminemia - Case discussed the case with spoke with nephrologist Dr. Carolin Sicks, outpt patient follow-up, weight loss and tight control of hypertension along with diabetes mellitus.  Nonacute renal ultrasound except for 2.6 cm left upper pole kidney cyst, will request to follow-up with nephrology outpatient for monitoring.  8. DM type II.  Poor control outpatient due to hyperglycemia, continue home regimen counseled on dietary and diabetic medication compliance, follow-up with PCP, requested to check CBGs QA CHS and shoulder results within a week to PCP for further adjustment if needed.   Lab Results  Component Value Date   HGBA1C 8.9 (H) 01/26/2021   Lab Results  Component Value Date   CHOL 374 (H) 01/26/2021   HDL 50 01/26/2021   LDLCALC 281 (H) 01/26/2021   TRIG 213 (H) 01/26/2021   CHOLHDL 7.5 01/26/2021      Discharge diagnosis     Principal Problem:   Acute CVA (cerebrovascular accident) [facility]) Active Problems:   Morbid obesity ([place])   Pulmonary emboli ([facility])   Hypertensive urgency    Discharge instructions    Discharge Instructions    Discharge instructions   Complete by: As directed    Follow with Primary MD Tomasa Hose, NP in 7 days   Get CBC, CMP, Magnesium, UA -  checked next visit within 1 week by Primary MD  Activity: As tolerated with Full fall precautions use walker/cane & assistance as needed  Disposition Home    Diet: Heart Healthy Low Carb  Accuchecks 4 times/day, Once in AM empty stomach and then before each meal. Log in all results and show them to your Prim.MD in 3 days. If any glucose reading is under 80 or above 300 call your Prim MD immidiately. Follow Low glucose instructions for glucose under 80 as instructed.  Special Instructions: If you have smoked or chewed Tobacco  in the last 2 yrs please stop smoking, stop any regular Alcohol  and or any Recreational drug use.  On your next visit with your primary care physician please Get Medicines reviewed and adjusted.  Please request your Prim.MD to go over all Hospital Tests and Procedure/Radiological results at the follow up, please get all Hospital records sent to your Prim MD by signing hospital release before you go home.  If you experience worsening of your admission symptoms, develop shortness of breath, life threatening emergency, suicidal or homicidal thoughts you must seek medical attention immediately by calling 911 or calling your MD immediately  if symptoms less severe.  You Must read complete instructions/literature along with all the possible adverse reactions/side effects for all the Medicines you take and that have been prescribed to you. Take any new Medicines after you have completely understood and accpet all the possible adverse reactions/side effects.                                         untitled image                                    [facility]  [address], [place] [address]     Sherri Dixon was admitted to the Hospital on 01/25/2021 and Discharged  01/28/2021 and should be excused from work/school   For 10 days starting from date -  01/25/2021 , may return to work/school without any restrictions.  Call Lala Lund MD, [facility]  [phone] with questions.  Lala Lund M.D on 01/28/2021,at 9:16 AM  [facility]   Office  [phone]   Increase activity slowly   Complete by: As directed       Discharge Medications   Allergies as of 01/28/2021      Reactions   Amlodipine Swelling   Humalog Kwikpen [insulin Lispro] Other (See Comments)   75/50---swelling and ulcers in mouth       Medication List    STOP taking these medications   ibuprofen 200 MG tablet Commonly known as: ADVIL   simvastatin 20 MG tablet Commonly known as: ZOCOR     TAKE these medications   Alcohol Swabs 70 % Pads 1 application by Does not apply route 3 (three) times daily before meals.   Basaglar KwikPen 100 UNIT/ML Inject 50 Units into the skin at bedtime. What changed: additional instructions   cetirizine 10 MG tablet Commonly known as: ZYRTEC Take 10 mg by mouth daily as needed for allergies.   cyanocobalamin 1000 MCG tablet Take 1,000 mcg by mouth daily.   ferrous sulfate 325 (65 FE) MG EC tablet Take 1 tablet (325 mg total) by mouth 2 (two) times daily.   furosemide 40 MG tablet Commonly known as: LASIX Take 2 tablets twice a day for 5 days, then take 1 tablet twice a day till you see your PCP. What changed:   how much to take  how to take this  when to take this   gabapentin 300 MG capsule Commonly known as: NEURONTIN Take 300 mg by mouth daily as needed (pain).   glucose blood test strip Use as instructed   hydrALAZINE 50 MG tablet Commonly known as:  APRESOLINE Take 1 tablet (50 mg total) by mouth 3 (three) times daily.   INSULIN SYRINGE 1CC/28G 28G X 1/2" 1 ML Misc 1 application by Does not apply route 3 (three) times daily before meals.   metoprolol succinate 100 MG 24 hr tablet Commonly known as: TOPROL-XL Take 1 tablet (100 mg total) by mouth daily.   multivitamin with minerals Tabs tablet Take 1 tablet by mouth daily. Centrum   nitroGLYCERIN 0.4 MG SL tablet Commonly known as: NITROSTAT Place 0.4 mg under the tongue every 5 (five) minutes as needed for chest pain.   NovoLOG FlexPen 100 UNIT/ML FlexPen Generic drug: insulin aspart Inject 12-20 Units as directed 3 (three) times daily. Sliding scale   olmesartan 40 MG tablet Commonly known as: BENICAR Take 1 tablet (40 mg total) by mouth daily. Start taking on: Jan 30, 2021 What changed: These instructions start on Jan 30, 2021. If you are unsure what to do until then, ask your doctor or other care provider.   potassium chloride SA 20 MEQ tablet Commonly known as: KLOR-CON Take 20 mEq by mouth daily.   rosuvastatin 40 MG tablet Commonly known as: CRESTOR Take 1 tablet (40 mg total) by mouth daily. Start taking on: Jan 29, 2021   Xarelto 20 MG Tabs tablet Generic drug: rivaroxaban Take 20 mg by mouth daily.        Follow-up Information    Tomasa Hose, NP. Schedule an appointment as soon as  possible for a visit in 1 week(s).   Specialty: Nurse Practitioner Contact information: [place] [phone] [phone]        Rosita Fire, MD. Schedule an appointment as soon as possible for a visit in 1 week(s).   Specialties: Nephrology, Internal Medicine Contact information: [place] [address] [place]. Schedule an appointment as soon as possible for a visit in 1 month(s).   Why: CVA Contact information: [address]     [place]  [phone]              Major procedures and Radiology Reports - PLEASE review detailed and final reports thoroughly  -       CT Head Wo Contrast  Result Date: 01/25/2021 CLINICAL DATA:  Nausea, emesis, headache for 3 days EXAM: CT HEAD WITHOUT CONTRAST TECHNIQUE: Contiguous axial images were obtained from the base of the skull through the vertex without intravenous contrast. COMPARISON:  None. FINDINGS: Brain: Hypodensity centered in the right basal ganglia measuring approximately 3.2 x 1.9 cm most consistent with acute to subacute infarct. No significant mass effect. No evidence of acute hemorrhage. The lateral ventricles and remaining midline structures are unremarkable. There are no acute extra-axial fluid collections. Vascular: No hyperdense vessel or unexpected calcification. Skull: Normal. Negative for fracture or focal lesion. Sinuses/Orbits: Mild mucosal thickening within the frontal and right maxillary sinuses. Other: None. IMPRESSION: 1. 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 No evidence of hemorrhagic transformation or significant mass effect. Critical Value/emergent results were called by telephone at the time of interpretation on 01/25/2021 at 5:50 pm to provider Deno Etienne , who verbally acknowledged these results. Electronically Signed   By: Randa Ngo M.D.   On: 01/25/2021 17:53   MR ANGIO HEAD WO CONTRAST  Result Date: 01/26/2021 CLINICAL DATA:  Follow-up examination for acute stroke. EXAM: MRI HEAD WITHOUT CONTRAST MRA HEAD WITHOUT CONTRAST TECHNIQUE: Multiplanar, multiecho pulse sequences of the brain and surrounding structures were obtained without intravenous contrast. Angiographic images of the head were obtained using MRA technique without contrast. COMPARISON:  Prior head CT from 01/25/2021. FINDINGS: MRI HEAD FINDINGS Brain: Cerebral volume within normal limits. Scattered T2/FLAIR hyperintensities involving the  periventricular, deep, and subcortical white matter both cerebral hemispheres, most likely related chronic microvascular ischemic disease, moderate in nature for age. 2.6 cm focus of restricted diffusion involving the right basal ganglia consistent with an acute to early subacute ischemic infarct. Mild localized edema without significant regional mass effect. Associated mild petechial hemorrhage without frank hemorrhagic transformation. Partial extension into the right cerebral peduncle and midbrain (series 5, image 68). No other acute or subacute ischemia. Gray-white matter differentiation otherwise maintained. No other areas of remote cortical infarction. No other evidence for acute or chronic intracranial hemorrhage. No mass lesion, midline shift or mass effect. No extra-axial fluid collection. Pituitary gland suprasellar region within normal limits. Midline structures intact. Vascular: Major intracranial vascular flow voids are maintained. Skull and upper cervical spine: Craniocervical junction within normal limits. Bone marrow signal intensity normal. No focal marrow replacing lesion. No scalp soft tissue abnormality. Sinuses/Orbits: Globes and orbital soft tissues demonstrate no acute finding. Scattered mucosal thickening noted throughout the frontoethmoidal and maxillary sinuses. Left mastoid effusion noted, of doubtful significance in the acute setting. Inner ear structures grossly normal. Other: None. MRA HEAD FINDINGS ANTERIOR CIRCULATION: Examination degraded by motion artifact. Distal cervical  segments of the internal carotid arteries are patent with antegrade flow. Distal cervical left ICA tortuous. Petrous, cavernous, and supraclinoid segments patent without definite stenosis or other abnormality. A1 segments patent bilaterally. Normal anterior communicating artery complex. Anterior cerebral arteries patent to their distal aspects without stenosis. No visible M1 stenosis or occlusion. Negative MCA  bifurcations. Distal MCA branches well perfused and symmetric. POSTERIOR CIRCULATION: Visualized distal cervical segments of the internal carotid arteries are patent with antegrade flow. Left vertebral artery slightly dominant. Left PICA origin patent and normal. Right PICA not definitely seen. Basilar patent to its distal aspect without appreciable stenosis. Superior cerebellar arteries patent bilaterally. Both PCA supplied via the basilar as well as small bilateral posterior communicating arteries. PCAs perfused to their distal aspects without appreciable stenosis. No intracranial aneurysm on this motion degraded exam. IMPRESSION: MRI HEAD IMPRESSION: 1. 2.6 cm acute to early subacute ischemic infarct involving the right basal ganglia. Associated mild petechial hemorrhage without frank hemorrhagic transformation. 2. Underlying moderate chronic microvascular ischemic disease. MRA HEAD IMPRESSION: 1. Motion degraded exam. 2. Negative intracranial MRA. No large vessel occlusion, hemodynamically significant stenosis, or other acute vascular abnormality. Electronically Signed   By: Jeannine Boga M.D.   On: 01/26/2021 04:11   MR BRAIN WO CONTRAST  Result Date: 01/26/2021 CLINICAL DATA:  Follow-up examination for acute stroke. EXAM: MRI HEAD WITHOUT CONTRAST MRA HEAD WITHOUT CONTRAST TECHNIQUE: Multiplanar, multiecho pulse sequences of the brain and surrounding structures were obtained without intravenous contrast. Angiographic images of the head were obtained using MRA technique without contrast. COMPARISON:  Prior head CT from 01/25/2021. FINDINGS: MRI HEAD FINDINGS Brain: Cerebral volume within normal limits. Scattered T2/FLAIR hyperintensities involving the periventricular, deep, and subcortical white matter both cerebral hemispheres, most likely related chronic microvascular ischemic disease, moderate in nature for age. 2.6 cm focus of restricted diffusion involving the right basal ganglia consistent with  an acute to early subacute ischemic infarct. Mild localized edema without significant regional mass effect. Associated mild petechial hemorrhage without frank hemorrhagic transformation. Partial extension into the right cerebral peduncle and midbrain (series 5, image 68). No other acute or subacute ischemia. Gray-white matter differentiation otherwise maintained. No other areas of remote cortical infarction. No other evidence for acute or chronic intracranial hemorrhage. No mass lesion, midline shift or mass effect. No extra-axial fluid collection. Pituitary gland suprasellar region within normal limits. Midline structures intact. Vascular: Major intracranial vascular flow voids are maintained. Skull and upper cervical spine: Craniocervical junction within normal limits. Bone marrow signal intensity normal. No focal marrow replacing lesion. No scalp soft tissue abnormality. Sinuses/Orbits: Globes and orbital soft tissues demonstrate no acute finding. Scattered mucosal thickening noted throughout the frontoethmoidal and maxillary sinuses. Left mastoid effusion noted, of doubtful significance in the acute setting. Inner ear structures grossly normal. Other: None. MRA HEAD FINDINGS ANTERIOR CIRCULATION: Examination degraded by motion artifact. Distal cervical segments of the internal carotid arteries are patent with antegrade flow. Distal cervical left ICA tortuous. Petrous, cavernous, and supraclinoid segments patent without definite stenosis or other abnormality. A1 segments patent bilaterally. Normal anterior communicating artery complex. Anterior cerebral arteries patent to their distal aspects without stenosis. No visible M1 stenosis or occlusion. Negative MCA bifurcations. Distal MCA branches well perfused and symmetric. POSTERIOR CIRCULATION: Visualized distal cervical segments of the internal carotid arteries are patent with antegrade flow. Left vertebral artery slightly dominant. Left PICA origin patent and  normal. Right PICA not definitely seen. Basilar patent to its distal aspect without appreciable stenosis. Superior cerebellar arteries patent  bilaterally. Both PCA supplied via the basilar as well as small bilateral posterior communicating arteries. PCAs perfused to their distal aspects without appreciable stenosis. No intracranial aneurysm on this motion degraded exam. IMPRESSION: MRI HEAD IMPRESSION: 1. 2.6 cm acute to early subacute ischemic infarct involving the right basal ganglia. Associated mild petechial hemorrhage without frank hemorrhagic transformation. 2. Underlying moderate chronic microvascular ischemic disease. MRA HEAD IMPRESSION: 1. Motion degraded exam. 2. Negative intracranial MRA. No large vessel occlusion, hemodynamically significant stenosis, or other acute vascular abnormality. Electronically Signed   By: Jeannine Boga M.D.   On: 01/26/2021 04:11   US RENAL  Result Date: 01/27/2021 CLINICAL DATA:  Proteinuria EXAM: RENAL / URINARY TRACT ULTRASOUND COMPLETE COMPARISON:  None. FINDINGS: Right Kidney: Renal measurements: 12.4 x 5.3 x 7.3 cm = volume: 250.2 mL. Contains a 2.6 cm cyst in the medial upper pole. Left Kidney: Renal measurements: 12.3 x 6.3 x 6.3 cm = volume: 256 mL. Echogenicity within normal limits. No mass or hydronephrosis visualized. Bladder: Appears normal for degree of bladder distention. Other: None. IMPRESSION: Suboptimal study due to patient body habitus. There is a 2.6 cm cyst in the upper pole the right kidney. No other abnormalities. Electronically Signed   By: Dorise Bullion III M.D   On: 01/27/2021 16:20   ECHOCARDIOGRAM COMPLETE BUBBLE STUDY  Result Date: 01/26/2021    ECHOCARDIOGRAM REPORT   Patient Name:   Sherri Dixon Date of Exam: 01/26/2021 Medical Rec #:  [id number]     Height:       65.0 in Accession #:    [id number]    Weight:       335.0 lb Date of Birth:  [birth year]      BSA:          2.463 m Patient Age:    40 years      BP:           125/70 mmHg  Patient Gender: F             HR:           80 bpm. Exam Location:  Inpatient Procedure: 2D Echo, Cardiac Doppler, Color Doppler, Strain Analysis and Saline            Contrast Bubble Study Indications:    Stroke 434.19/IC3.9  History:        Patient has prior history of Echocardiogram examinations, most                 recent 12/14/2020. CHF; Risk Factors:Diabetes, Dyslipidemia and                 Morbid obesity.  Sonographer:    Merrie Roof Referring Phys: [id number] [place]  1. Left ventricular ejection fraction, by estimation, is 60 to 65%. The left ventricle has normal function. The left ventricle has no regional wall motion abnormalities. There is moderate left ventricular hypertrophy. Left ventricular diastolic parameters are consistent with Grade I diastolic dysfunction (impaired relaxation). Elevated left ventricular end-diastolic pressure.  2. Right ventricular systolic function is normal. The right ventricular size is normal.  3. A small pericardial effusion is present. The pericardial effusion is circumferential. There is no evidence of cardiac tamponade.  4. The mitral valve is abnormal. Trivial mitral valve regurgitation.  5. The aortic valve is tricuspid. Aortic valve regurgitation is not visualized.  6. Mildly dilated pulmonary artery.  7. The inferior vena cava is normal in size with greater than 50% respiratory variability, suggesting right atrial  pressure of 3 mmHg.  8. Agitated saline contrast bubble study was negative, with no evidence of any interatrial shunt. Comparison(s): Changes from prior study are noted. 12/14/2020: LVEF 55-60%, small pericardial effusion without tamponade. FINDINGS  Left Ventricle: Left ventricular ejection fraction, by estimation, is 60 to 65%. The left ventricle has normal function. The left ventricle has no regional wall motion abnormalities. The left ventricular internal cavity size was normal in size. There is  moderate left ventricular hypertrophy.  Left ventricular diastolic parameters are consistent with Grade I diastolic dysfunction (impaired relaxation). Elevated left ventricular end-diastolic pressure. Right Ventricle: The right ventricular size is normal. No increase in right ventricular wall thickness. Right ventricular systolic function is normal. Left Atrium: Left atrial size was normal in size. Right Atrium: Right atrial size was normal in size. Pericardium: A small pericardial effusion is present. The pericardial effusion is circumferential. There is no evidence of cardiac tamponade. Mitral Valve: The mitral valve is abnormal. There is mild thickening of the mitral valve leaflet(s). Trivial mitral valve regurgitation. Tricuspid Valve: The tricuspid valve is grossly normal. Tricuspid valve regurgitation is trivial. Aortic Valve: The aortic valve is tricuspid. Aortic valve regurgitation is not visualized. Aortic valve mean gradient measures 6.0 mmHg. Aortic valve peak gradient measures 10.5 mmHg. Aortic valve area, by VTI measures 2.31 cm. Pulmonic Valve: The pulmonic valve was grossly normal. Pulmonic valve regurgitation is not visualized. Aorta: The aortic root and ascending aorta are structurally normal, with no evidence of dilitation. Pulmonary Artery: The pulmonary artery is mildly dilated. Venous: The inferior vena cava is normal in size with greater than 50% respiratory variability, suggesting right atrial pressure of 3 mmHg. IAS/Shunts: No atrial level shunt detected by color flow Doppler. Agitated saline contrast was given intravenously to evaluate for intracardiac shunting. Agitated saline contrast bubble study was negative, with no evidence of any interatrial shunt.  LEFT VENTRICLE PLAX 2D LVIDd:         4.80 cm  Diastology LVIDs:         3.30 cm  LV e' medial:    5.98 cm/s LV PW:         1.50 cm  LV E/e' medial:  19.2 LV IVS:        1.30 cm  LV e' lateral:   6.85 cm/s LVOT diam:     1.90 cm  LV E/e' lateral: 16.8 LV SV:         72 LV SV  Index:   29 LVOT Area:     2.84 cm  RIGHT VENTRICLE          IVC RV Basal diam:  3.40 cm  IVC diam: 1.60 cm LEFT ATRIUM             Index       RIGHT ATRIUM           Index LA diam:        4.50 cm 1.83 cm/m  RA Area:     19.30 cm LA Vol (A2C):   74.9 ml 30.41 ml/m RA Volume:   49.90 ml  20.26 ml/m LA Vol (A4C):   73.3 ml 29.76 ml/m LA Biplane Vol: 81.0 ml 32.89 ml/m  AORTIC VALVE AV Area (Vmax):    2.22 cm AV Area (Vmean):   2.13 cm AV Area (VTI):     2.31 cm AV Vmax:           162.00 cm/s AV Vmean:  115.500 cm/s AV VTI:            0.312 m AV Peak Grad:      10.5 mmHg AV Mean Grad:      6.0 mmHg LVOT Vmax:         127.00 cm/s LVOT Vmean:        86.950 cm/s LVOT VTI:          0.255 m LVOT/AV VTI ratio: 0.82  AORTA Ao Root diam: 2.70 cm Ao Asc diam:  3.00 cm MITRAL VALVE MV Area (PHT): 3.74 cm     SHUNTS MV Decel Time: 203 msec     Systemic VTI:  0.26 m MV E velocity: 115.00 cm/s  Systemic Diam: 1.90 cm MV A velocity: 122.50 cm/s MV E/A ratio:  0.94 Lyman Bishop MD Electronically signed by Lyman Bishop MD Signature Date/Time: 01/26/2021/12:08:11 PM    Final    VAS US CAROTID (at [facility] and [facility] only)  Result Date: 01/26/2021 Carotid Arterial Duplex Study Patient Name:  Sherri Dixon  Date of Exam:   01/26/2021 Medical Rec #: [id number]      Accession #:    [id number] Date of Birth: October 12, 1980       Patient Gender: F Patient Age:   27Y Exam Location:  [facility] Procedure:      VAS US CAROTID Referring Phys: [id number] [facility] --------------------------------------------------------------------------------  Indications:       Facial weakness, headache. Elevated blood pressure. Risk Factors:      Hypertension, hyperlipidemia, Diabetes. Other Factors:     Morbid obesity. CHF. Comparison Study:  No prior study on file Performing Technologist: Sharion Dove RVS  Examination Guidelines: A complete evaluation includes B-mode imaging, spectral Doppler, color Doppler, and power Doppler as needed  of all accessible portions of each vessel. Bilateral testing is considered an integral part of a complete examination. Limited examinations for reoccurring indications may be performed as noted.  Right Carotid Findings: +----------+--------+--------+--------+------------------+------------------+           PSV cm/sEDV cm/sStenosisPlaque DescriptionComments           +----------+--------+--------+--------+------------------+------------------+ CCA Prox  101     17                                intimal thickening +----------+--------+--------+--------+------------------+------------------+ CCA Distal74      17                                intimal thickening +----------+--------+--------+--------+------------------+------------------+ ICA Prox  51      15                                                   +----------+--------+--------+--------+------------------+------------------+ ICA Distal72      23                                                   +----------+--------+--------+--------+------------------+------------------+ ECA       117     14                                                   +----------+--------+--------+--------+------------------+------------------+ +----------+--------+-------+--------+-------------------+  PSV cm/sEDV cmsDescribeArm Pressure (mmHG) +----------+--------+-------+--------+-------------------+ Subclavian114     14                                 +----------+--------+-------+--------+-------------------+ +---------+--------+--+--------+--+ VertebralPSV cm/s66EDV cm/s18 +---------+--------+--+--------+--+  Left Carotid Findings: +----------+--------+--------+--------+------------------+------------------+           PSV cm/sEDV cm/sStenosisPlaque DescriptionComments           +----------+--------+--------+--------+------------------+------------------+ CCA Prox  108     16                                 intimal thickening +----------+--------+--------+--------+------------------+------------------+ CCA Distal85      20                                intimal thickening +----------+--------+--------+--------+------------------+------------------+ ICA Prox  76      27                                                   +----------+--------+--------+--------+------------------+------------------+ ICA Distal84      31                                                   +----------+--------+--------+--------+------------------+------------------+ ECA       72      7                                                    +----------+--------+--------+--------+------------------+------------------+ +----------+--------+--------+--------+-------------------+           PSV cm/sEDV cm/sDescribeArm Pressure (mmHG) +----------+--------+--------+--------+-------------------+ [id number]                                         +----------+--------+--------+--------+-------------------+ +---------+--------+--+--------+--+ VertebralPSV cm/s95EDV cm/s26 +---------+--------+--+--------+--+   Summary: Right Carotid: The extracranial vessels were near-normal with only minimal wall                thickening or plaque. Left Carotid: The extracranial vessels were near-normal with only minimal wall               thickening or plaque. Vertebrals:  Bilateral vertebral arteries demonstrate antegrade flow. Subclavians: Normal flow hemodynamics were seen in bilateral subclavian              arteries. *See table(s) above for measurements and observations.     Preliminary     Micro Results     Recent Results (from the past 240 hour(s))  Resp Panel by RT-PCR (Flu A&B, Covid) Nasopharyngeal Swab     Status: None   Collection Time: 01/25/21  6:38 PM   Specimen: Nasopharyngeal Swab; Nasopharyngeal(NP) swabs in vial transport medium  Result Value Ref Range Status   SARS Coronavirus 2 by RT PCR  NEGATIVE NEGATIVE Final    Comment: (NOTE) SARS-CoV-2 target nucleic acids are NOT  DETECTED.  The SARS-CoV-2 RNA is generally detectable in upper respiratory specimens during the acute phase of infection. The lowest concentration of SARS-CoV-2 viral copies this assay can detect is 138 copies/mL. A negative result does not preclude SARS-Cov-2 infection and should not be used as the sole basis for treatment or other patient management decisions. A negative result may occur with  improper specimen collection/handling, submission of specimen other than nasopharyngeal swab, presence of viral mutation(s) within the areas targeted by this assay, and inadequate number of viral copies(<138 copies/mL). A negative result must be combined with clinical observations, patient history, and epidemiological information. The expected result is Negative.  Fact Sheet for Patients:  EntrepreneurPulse.com.au  Fact Sheet for Healthcare Providers:  IncredibleEmployment.be  This test is no t yet approved or cleared by the [place] FDA and  has been authorized for detection and/or diagnosis of SARS-CoV-2 by FDA under an Emergency Use Authorization (EUA). This EUA will remain  in effect (meaning this test can be used) for the duration of the COVID-19 declaration under Section 564(b)(1) of the Act, 21 U.S.C.section 360bbb-3(b)(1), unless the authorization is terminated  or revoked sooner.       Influenza A by PCR NEGATIVE NEGATIVE Final   Influenza B by PCR NEGATIVE NEGATIVE Final    Comment: (NOTE) The Xpert Xpress SARS-CoV-2/FLU/RSV plus assay is intended as an aid in the diagnosis of influenza from Nasopharyngeal swab specimens and should not be used as a sole basis for treatment. Nasal washings and aspirates are unacceptable for Xpert Xpress SARS-CoV-2/FLU/RSV testing.  Fact Sheet for Patients: EntrepreneurPulse.com.au  Fact Sheet for  Healthcare Providers: IncredibleEmployment.be  This test is not yet approved or cleared by the [place] FDA and has been authorized for detection and/or diagnosis of SARS-CoV-2 by FDA under an Emergency Use Authorization (EUA). This EUA will remain in effect (meaning this test can be used) for the duration of the COVID-19 declaration under Section 564(b)(1) of the Act, 21 U.S.C. section 360bbb-3(b)(1), unless the authorization is terminated or revoked.  Performed at [facility], [place] [address] [place], [place] [address]     Today   Subjective    Sherri Dixon today has no headache,no chest abdominal pain,no new weakness tingling or numbness, feels much better wants to go home today.     Objective   Blood pressure (!) 156/72, pulse 78, temperature 98.6 F (37 C), temperature source Oral, resp. rate 18, height 5\' 5"  (1.651 m), weight (!) 152 kg, last menstrual period 01/11/2021, SpO2 96 %.  No intake or output data in the 24 hours ending 01/28/21 0918  Exam  Awake Alert, No new F.N deficits, mild L sided weakness and F. droop [place].AT,PERRAL Supple Neck,No JVD, No cervical lymphadenopathy appriciated.  Symmetrical Chest wall movement, Good air movement bilaterally, CTAB RRR,No Gallops,Rubs or new Murmurs, No Parasternal Heave +ve B.Sounds, Abd Soft, Non tender, No organomegaly appriciated, No rebound -guarding or rigidity. No Cyanosis, Clubbing, 1+ edema   Data Review   CBC w Diff:  Lab Results  Component Value Date   WBC 11.9 (H) 01/28/2021   HGB 8.5 (L) 01/28/2021   HCT 27.4 (L) 01/28/2021   PLT 249 01/28/2021   LYMPHOPCT 31 01/28/2021   MONOPCT 7 01/28/2021   EOSPCT 4 01/28/2021   BASOPCT 0 01/28/2021    CMP:  Lab Results  Component Value Date   NA 139 01/28/2021   K 3.0 (L) 01/28/2021   CL 108 01/28/2021   CO2 25 01/28/2021  BUN 9 01/28/2021   CREATININE 1.19 (H) 01/28/2021   PROT 4.2 (L) 01/28/2021   ALBUMIN  1.3 (L) 01/28/2021   BILITOT 0.3 01/28/2021   ALKPHOS 62 01/28/2021   AST 18 01/28/2021   ALT 12 01/28/2021  .   Total Time in preparing paper work, data evaluation and todays exam - 83 minutes  Lala Lund M.D on 01/28/2021 at 9:18 AM  [facility]

## 2021-01-28 NOTE — Progress Notes (Signed)
ANTICOAGULATION CONSULT NOTE - Follow Up Consult  Pharmacy Consult for heparin Indication: acute stroke w/ h/o PE  Labs: Recent Labs    01/25/21 1804 01/25/21 2113 01/26/21 0332 01/26/21 2030 01/26/21 2030 01/27/21 0300 01/27/21 0805 01/27/21 2137 01/28/21 0333  HGB  --    < > 9.0*  --   --  8.9*  --   --  8.5*  HCT  --    < > 29.7*  --   --  29.1*  --   --  27.4*  PLT  --    < > 274  --   --  256  --   --  249  APTT 28  --   --  57*  --   --   --  116*  --   LABPROT 12.7  --   --   --   --   --   --   --   --   INR 1.0  --   --   --   --   --   --   --   --   HEPARINUNFRC  --   --   --  0.24*   < > 0.13*  --  0.64 0.55  CREATININE  --    < > 1.20*  --   --   --  1.17*  --  1.19*   < > = values in this interval not displayed.    Assessment: 40yo female remains supratherapeutic on heparin though close to goal; no gtt issues or signs of bleeding per RN.  Goal of Therapy:  Heparin level 0.3-0.5 units/ml   Plan:  Will decrease heparin gtt slightly to 1600 units/hr and check level in 6 hours.    Wynona Neat, PharmD, BCPS  01/28/2021,4:52 AM

## 2021-01-28 NOTE — Progress Notes (Signed)
STROKE TEAM PROGRESS NOTE   INTERVAL HISTORY No acute event overnight, heparin IV will switch back to Xarelto on discharge.   OBJECTIVE Vitals:   01/27/21 2325 01/28/21 0336 01/28/21 0837 01/28/21 0850  BP: (!) 189/87 (!) 164/72 (!) 176/96 (!) 156/72  Pulse: 79 82 78   Resp: 17 18 18    Temp: 98.1 F (36.7 C) 98.1 F (36.7 C) 98.6 F (37 C)   TempSrc: Oral Oral Oral   SpO2: 100% 98% 96%   Weight:      Height:        CBC:  Recent Labs  Lab 01/25/21 1457 01/25/21 2113 01/27/21 0300 01/28/21 0333  WBC 11.5*   < > 10.5 11.9*  NEUTROABS 7.2  --   --  6.9  HGB 10.6*   < > 8.9* 8.5*  HCT 34.2*   < > 29.1* 27.4*  MCV 82.4   < > 83.1 81.3  PLT 329   < > 256 249   < > = values in this interval not displayed.    Basic Metabolic Panel:  Recent Labs  Lab 01/27/21 0805 01/28/21 0333  NA 139 139  K 3.1* 3.0*  CL 108 108  CO2 25 25  GLUCOSE 68* 61*  BUN 10 9  CREATININE 1.17* 1.19*  CALCIUM 8.0* 7.9*  MG  --  1.6*    Lipid Panel:     Component Value Date/Time   CHOL 374 (H) 01/26/2021 0332   TRIG 213 (H) 01/26/2021 0332   HDL 50 01/26/2021 0332   CHOLHDL 7.5 01/26/2021 0332   VLDL 43 (H) 01/26/2021 0332   LDLCALC 281 (H) 01/26/2021 0332   HgbA1c:  Lab Results  Component Value Date   HGBA1C 8.9 (H) 01/26/2021   Urine Drug Screen:     Component Value Date/Time   LABOPIA NONE DETECTED 01/27/2021 0855   COCAINSCRNUR NONE DETECTED 01/27/2021 0855   LABBENZ NONE DETECTED 01/27/2021 0855   AMPHETMU NONE DETECTED 01/27/2021 0855   THCU NONE DETECTED 01/27/2021 0855   LABBARB NONE DETECTED 01/27/2021 0855    Alcohol Level No results found for: ETH  IMAGING  CT Head Wo Contrast 01/25/2021 IMPRESSION:  Acute to subacute infarct centered in the right basal ganglia. No evidence of hemorrhagic transformation or significant mass effect.   MR ANGIO HEAD WO CONTRAST 01/26/2021  MRI HEAD  IMPRESSION:  1. 2.6 cm acute to early subacute ischemic infarct involving  the right basal ganglia. Associated mild petechial hemorrhage without frank hemorrhagic transformation.  2. Underlying moderate chronic microvascular ischemic disease.   MRA HEAD  IMPRESSION:  1. Motion degraded exam.  2. Negative intracranial MRA. No large vessel occlusion, hemodynamically significant stenosis, or other acute vascular abnormality.    Transthoracic Echocardiogram  00/00/2021 IMPRESSIONS  1. Left ventricular ejection fraction, by estimation, is 60 to 65%. The  left ventricle has normal function. The left ventricle has no regional  wall motion abnormalities. There is moderate left ventricular hypertrophy.  Left ventricular diastolic  parameters are consistent with Grade I diastolic dysfunction (impaired  relaxation). Elevated left ventricular end-diastolic pressure.  2. Right ventricular systolic function is normal. The right ventricular  size is normal.  3. A small pericardial effusion is present. The pericardial effusion is  circumferential. There is no evidence of cardiac tamponade.  4. The mitral valve is abnormal. Trivial mitral valve regurgitation.  5. The aortic valve is tricuspid. Aortic valve regurgitation is not  visualized.  6. Mildly dilated pulmonary artery.  7. The  inferior vena cava is normal in size with greater than 50%  respiratory variability, suggesting right atrial pressure of 3 mmHg.  8. Agitated saline contrast bubble study was negative, with no evidence  of any interatrial shunt.   Bilateral Carotid Dopplers  00/00/2021 Summary:  Right Carotid: The extracranial vessels were near-normal with only minimal wall thickening or plaque.  Left Carotid: The extracranial vessels were near-normal with only minimal wall thickening or plaque.  Vertebrals: Bilateral vertebral arteries demonstrate antegrade flow.  Subclavians: Normal flow hemodynamics were seen in bilateral subclavian arteries.   ECG - SR rate 84 BPM. (See cardiology reading for  complete details)  PHYSICAL EXAM  Temp:  [98.1 F (36.7 C)-98.6 F (37 C)] 98.6 F (37 C) (05/02 0837) Pulse Rate:  [75-86] 78 (05/02 0837) Resp:  [17-18] 18 (05/02 0837) BP: (156-189)/(72-129) 156/72 (05/02 0850) SpO2:  [96 %-100 %] 96 % (05/02 0837)  General - morbid obesity, well developed, in no apparent distress.  Ophthalmologic - fundi not visualized due to noncooperation.  Cardiovascular - Regular rhythm and rate.  Mental Status -  Level of arousal and orientation to time, place, and person were intact. Language including expression, naming, repetition, comprehension was assessed and found intact. Attention span and concentration were normal. Fund of Knowledge was assessed and was intact.  Cranial Nerves II - XII - II - Visual field intact OU. III, IV, VI - Extraocular movements intact. V - Facial sensation intact bilaterally. VII - left mild facial droop. VIII - Hearing & vestibular intact bilaterally. X - Palate elevates symmetrically. XI - Chin turning & shoulder shrug intact bilaterally. XII - Tongue protrusion intact.  Motor Strength - The patient's strength was normal in all extremities and pronator drift was absent except slight left finger grip weakness.  Bulk was normal and fasciculations were absent.   Motor Tone - Muscle tone was assessed at the neck and appendages and was normal.  Reflexes - The patient's reflexes were symmetrical in all extremities and she had no pathological reflexes.  Sensory - Light touch, temperature/pinprick were assessed and were symmetrical.    Coordination - The patient had normal movements in the hands with no ataxia or dysmetria.  Tremor was absent.  Gait and Station - deferred.    ASSESSMENT/PLAN Ms. Sherri Dixon is a 40 y.o. female with history of DM2, GERD, PE on Xarelto, HLD who presents with nausea and headache x 3 days with facial droop since 4/23. (pt reported hx of atrial fibrillation but this could not be  confirmed) She did not receive IV t-PA due to late presentation (>4.5 hours from time of onset) and recent anticoagulation.   Stroke: large right BG infarct - embolic vs small vessel disease.  CT head - Acute to subacute infarct centered in the right basal ganglia.  MRI head - 2.6 cm acute to early subacute ischemic infarct involving the right basal ganglia. Associated mild petechial hemorrhage without frank hemorrhagic transformation.  MRA head - Motion degraded exam. Negative intracranial MRA.  Carotid Doppler - near normal  2D Echo - EF 60 - 65%. No cardiac source of emboli identified.   [employer] Virus 2 - negative  LDL - 281  HgbA1c - 8.9  UDS - negative  Hypercoagulable work up homocysteine 33.2 and others pending  VTE prophylaxis - heparin IV  Xarelto (rivaroxaban) daily prior to admission, now switch heparin IV back to Xarelto.  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 Long-term BP goal normotensive  Hyperlipidemia  Home Lipid lowering medication: Zocor 20 mg daily  LDL 281, goal < 70  Current lipid lowering medication: Crestor 40 mg daily  Continue statin at discharge  Diabetes, uncontrolled  Home diabetic meds: insulin  Current diabetic meds: insulin  HgbA1c 8.9, goal < 7.0  CBGs  SSI  Need DM education and close PCP follow up  Other Stroke Risk Factors  ETOH use, advised to drink no more than 1 alcoholic beverage per day.  Morbid  obesity, Body mass index is 55.75 kg/m., recommend weight loss, diet and exercise as appropriate   Other Active Problems  Code status - Full code Hypokalemia - 3.3 ->3.3->3.1->3.0 AKI - creatinine - 1.12->1.20->1.17->1.19  Hospital day # 2  Neurology will sign off. Please call with questions. Pt will follow up with stroke clinic NP at [facility] in about 4 weeks. Thanks for the consult.   Rosalin Hawking, MD PhD Stroke Neurology 01/28/2021 2:13 PM    To contact Stroke Continuity provider, please refer to [email or web address] After hours, contact General Neurology

## 2021-01-28 NOTE — Discharge Instructions (Signed)
Follow with Primary MD Tomasa Hose, NP in 7 days   Get CBC, CMP, Magnesium, UA -  checked next visit within 1 week by Primary MD   Activity: As tolerated with Full fall precautions use walker/cane & assistance as needed  Disposition Home    Diet: Heart Healthy Low Carb  Accuchecks 4 times/day, Once in AM empty stomach and then before each meal. Log in all results and show them to your Prim.MD in 3 days. If any glucose reading is under 80 or above 300 call your Prim MD immidiately. Follow Low glucose instructions for glucose under 80 as instructed.  Special Instructions: If you have smoked or chewed Tobacco  in the last 2 yrs please stop smoking, stop any regular Alcohol  and or any Recreational drug use.  On your next visit with your primary care physician please Get Medicines reviewed and adjusted.  Please request your Prim.MD to go over all Hospital Tests and Procedure/Radiological results at the follow up, please get all Hospital records sent to your Prim MD by signing hospital release before you go home.  If you experience worsening of your admission symptoms, develop shortness of breath, life threatening emergency, suicidal or homicidal thoughts you must seek medical attention immediately by calling 911 or calling your MD immediately  if symptoms less severe.  You Must read complete instructions/literature along with all the possible adverse reactions/side effects for all the Medicines you take and that have been prescribed to you. Take any new Medicines after you have completely understood and accpet all the possible adverse reactions/side effects.

## 2021-01-28 NOTE — TOC Transition Note (Signed)
Transition of Care [facility]) - CM/SW Discharge Note   Patient Details  Name: Sherri Dixon MRN: [id number] Date of Birth: [birth year]  Transition of Care [facility]) CM/SW Contact:  Pollie Friar, RN Phone Number: 01/28/2021, 12:35 PM   Clinical Narrative:    Recommendations are for [facility] services. Cm unable to get [facility] agency to accept for [facility]. CM spoke to the patient and she is agreeable to outpatient therapy. Orders in for the [facility].  Pt states she will have needed transportation. Pt denies any medication issues at home. Spouse transporting home.    Final next level of care: OP Rehab Barriers to Discharge: No Barriers Identified   Patient Goals and CMS Choice     Choice offered to / list presented to : Patient  Discharge Placement                       Discharge Plan and Services                                     Social Determinants of Health (SDOH) Interventions     Readmission Risk Interventions No flowsheet data found.

## 2021-01-28 NOTE — Progress Notes (Signed)
ANTICOAGULATION CONSULT NOTE - Follow Up Consult  Pharmacy Consult for Xarelto Indication: acute stroke w/ h/o PE  Heparin dosing wt: 95kg  Labs: Recent Labs    01/25/21 1804 01/25/21 2113 01/26/21 0332 01/26/21 2030 01/26/21 2030 01/27/21 0300 01/27/21 0805 01/27/21 2137 01/28/21 0333  HGB  --    < > 9.0*  --   --  8.9*  --   --  8.5*  HCT  --    < > 29.7*  --   --  29.1*  --   --  27.4*  PLT  --    < > 274  --   --  256  --   --  249  APTT 28  --   --  57*  --   --   --  116*  --   LABPROT 12.7  --   --   --   --   --   --   --   --   INR 1.0  --   --   --   --   --   --   --   --   HEPARINUNFRC  --   --   --  0.24*   < > 0.13*  --  0.64 0.55  CREATININE  --    < > 1.20*  --   --   --  1.17*  --  1.19*   < > = values in this interval not displayed.    Assessment: 40yo female on Xarelto PTA for history of PE; had been off of it due to heavy menstrual bleeding. She is here with CVA and was started on IV heparin drip. Pharmacy consulted to resume Xarelto.  No bleeding noted, Hgb low but stable, platelets are normal.  Plan:  -Discontinue heparin drip -Xarelto 20 mg PO daily with supper, 1st dose with lunch today -Pharmacy signing off but will continue to follow peripherally - please re-consult if needed  Thank you for involving pharmacy in this patient's care.  Renold Genta, PharmD, BCPS Clinical Pharmacist Clinical phone for 01/28/2021 until 3p is [phone] 01/28/2021 8:17 AM  **Pharmacist phone directory can be found on [email or web address] listed under [place]**

## 2021-01-29 LAB — CARDIOLIPIN ANTIBODIES, IGG, IGM, IGA
Anticardiolipin IgA: <9 APL U/mL (ref 0–11)
Anticardiolipin IgG: <9 GPL U/mL (ref 0–14)
Anticardiolipin IgM: 13 MPL U/mL — ABNORMAL HIGH (ref 0–12)

## 2021-02-02 LAB — LUPUS ANTICOAGULANT PANEL
DRVVT: 48.7 s — ABNORMAL HIGH (ref 0.0–47.0)
PTT Lupus Anticoagulant: 43.2 s (ref 0.0–51.9)

## 2021-02-02 LAB — DRVVT MIX: dRVVT Mix: 40.1 s (ref 0.0–40.4)

## 2021-02-06 ENCOUNTER — Other Ambulatory Visit: Payer: Self-pay

## 2021-02-06 ENCOUNTER — Ambulatory Visit: Payer: 59

## 2021-02-06 VITALS — BP 190/110

## 2021-02-06 DIAGNOSIS — M6281 Muscle weakness (generalized): Secondary | ICD-10-CM

## 2021-02-06 NOTE — Therapy (Signed)
[facility] [address] [place], [place], [address] Phone: [phone]   Fax:  [phone]  Physical Therapy Evaluation- arrived no charge  Patient Details  Name: Sherri Dixon MRN: [id number] Date of Birth: 14-Jan-1981 Referring Provider (PT): Lala Lund (hospitalist) and PCP is Fonnie Jarvis   Encounter Date: 02/06/2021   PT End of Session - 02/06/21 1620    Visit Number 1    PT Start Time 4010    PT Stop Time 2725    PT Time Calculation (min) 20 min           Past Medical History:  Diagnosis Date  . Anemia   . Asthma   . CHF (congestive heart failure) (HCC)    Pt reports that father has CHF, not her  . Diabetes mellitus without complication ([place])   . Dyslipidemia   . GERD (gastroesophageal reflux disease)   . Pulmonary embolism [facility])     Past Surgical History:  Procedure Laterality Date  . MOUTH SURGERY    . ROBOTIC ASSITED PARTIAL NEPHRECTOMY Right 07/28/2019   Procedure: XI ROBOTIC ASSITED PARTIAL NEPHRECTOMY;  Surgeon: Cleon Gustin, MD;  Location: [facility];  Service: Urology;  Laterality: Right;  3 HRS    Vitals:   02/06/21 1632  BP: (!) 190/110      Subjective Assessment - 02/06/21 1620    Subjective 40 y/o female presented to ED on 4/29 with headache x 3 days and facial droop since 4/23. Workup with CTH demonstrated R basal ganglia acute/subacute stroke. Pt reports that she is having trouble focusing on things and gets frustrated easier. Pt reports that she has not been doing good checking her blood sugars and eating well since her stroke and sugars have been running higher. She reports that she is moving  a little slower and balance isn't quite as good. Pt reports she had 1 fall prior to going to hospital a week before when couldn't lift her leg up well. Pt reports she has not taken any meds today or eaten. Had pt check blood sugar after BP was assessed and very high. Blood sugar was  224.    Patient Stated Goals Pt would like to be able to lift her legs better especially the left.    Currently in Pain? No/denies              [facility] PT Assessment - 02/06/21 1625      Assessment   Medical Diagnosis R basal ganglia acute/subacute stroke    Referring Provider (PT) Lala Lund (hospitalist) and PCP is Fonnie Jarvis    Onset Date/Surgical Date 01/25/21    Hand Dominance Right    Prior Therapy acute care therapy      Precautions   Precautions Fall      Balance Screen   Has the patient fallen in the past 6 months Yes    How many times? 1    Has the patient had a decrease in activity level because of a fear of falling?  Yes    Is the patient reluctant to leave their home because of a fear of falling?  Yes      [place] residence    Living Arrangements Spouse/significant other;Children   step daughter is 64   Available Help at Discharge Family   at night available to help   Type of [place] to enter    Entrance Stairs-Number  of Steps 3    Entrance Stairs-Rails Right    Home Layout One level    Home Equipment None;Grab bars - tub/shower      Prior Function   Level of Independence Independent;Independent with community mobility without device    Vocation Full time employment   currently not working   [employer] data entry    [place]   Overall Cognitive Status Impaired/Different from baseline   reports some difficulty with concentration, frustration, recall                     Objective measurements completed on examination: See above findings.                            Plan - 02/06/21 1654    Clinical Impression Statement PT stopped eval due  to extremely elevated BP of 190/110. Pt had not taken any meds or eaten today. Instructed pt to return home and be sure to take her medications. Will reschedule PT eval. Pt advised to  be sure to take meds prior to coming and check BP as well. Educated on signs/symptoms of stroke and to seek medical attention immediately if should occur. Pt also to call PCP and schedule visit ASAP.           Patient will benefit from skilled therapeutic intervention in order to improve the following deficits and impairments:     Visit Diagnosis: Muscle weakness (generalized)     Problem List Patient Active Problem List   Diagnosis Date Noted  . Acute CVA (cerebrovascular accident) ([place]) 01/25/2021  . Acute on chronic congestive heart failure ([place]) 12/12/2020  . Diabetes mellitus type 2 in obese ([place]) 12/12/2020  . Hypertensive urgency 12/12/2020  . B12 deficiency anemia 03/11/2019  . Iron deficiency anemia due to chronic blood loss 03/10/2019  . Anemia 03/10/2019  . Pulmonary emboli ([place]) 05/22/2018  . Obesity, Class III, BMI 40-49.9 (morbid obesity) ([place]) 05/22/2018  . Eczema 05/22/2018  . Morbid obesity ([place]) 06/06/2016  . Uncontrolled diabetes mellitus with hyperglycemia, with long-term current use of insulin ([place]) 06/06/2016  . Hypokalemia 06/06/2016  . Diabetic ketoacidosis without coma associated with type 1 diabetes mellitus ([place])   . DKA (diabetic ketoacidoses) 06/05/2016  . GERD (gastroesophageal reflux disease)      Electa Sniff, PT, DPT, NCS 02/06/2021, 4:56 PM  [facility] [address] [place], [place], [address] Phone: [phone]   Fax:  [phone]  Name: Autumn Pruitt MRN: [id number] Date of Birth: 1981-04-10

## 2021-02-13 ENCOUNTER — Ambulatory Visit: Payer: 59 | Attending: Internal Medicine

## 2021-03-12 ENCOUNTER — Inpatient Hospital Stay: Payer: 59 | Admitting: Adult Health

## 2021-04-04 ENCOUNTER — Ambulatory Visit: Payer: 59 | Admitting: Podiatry

## 2021-04-05 ENCOUNTER — Inpatient Hospital Stay (HOSPITAL_COMMUNITY)
Admission: EM | Admit: 2021-04-05 | Discharge: 2021-04-17 | DRG: 854 | Disposition: A | Discharge status: Home / Self Care | Payer: 59 | Attending: Family Medicine | Admitting: Family Medicine

## 2021-04-05 ENCOUNTER — Emergency Department (HOSPITAL_COMMUNITY): Payer: 59

## 2021-04-05 ENCOUNTER — Encounter (HOSPITAL_COMMUNITY): Payer: Self-pay

## 2021-04-05 ENCOUNTER — Other Ambulatory Visit: Payer: Self-pay

## 2021-04-05 DIAGNOSIS — K219 Gastro-esophageal reflux disease without esophagitis: Secondary | ICD-10-CM | POA: Diagnosis present

## 2021-04-05 DIAGNOSIS — E1152 Type 2 diabetes mellitus with diabetic peripheral angiopathy with gangrene: Secondary | ICD-10-CM | POA: Diagnosis present

## 2021-04-05 DIAGNOSIS — E1140 Type 2 diabetes mellitus with diabetic neuropathy, unspecified: Secondary | ICD-10-CM | POA: Diagnosis present

## 2021-04-05 DIAGNOSIS — E119 Type 2 diabetes mellitus without complications: Secondary | ICD-10-CM

## 2021-04-05 DIAGNOSIS — N920 Excessive and frequent menstruation with regular cycle: Secondary | ICD-10-CM | POA: Diagnosis present

## 2021-04-05 DIAGNOSIS — E876 Hypokalemia: Secondary | ICD-10-CM | POA: Diagnosis not present

## 2021-04-05 DIAGNOSIS — E43 Unspecified severe protein-calorie malnutrition: Secondary | ICD-10-CM | POA: Diagnosis not present

## 2021-04-05 DIAGNOSIS — E785 Hyperlipidemia, unspecified: Secondary | ICD-10-CM | POA: Diagnosis present

## 2021-04-05 DIAGNOSIS — I69354 Hemiplegia and hemiparesis following cerebral infarction affecting left non-dominant side: Secondary | ICD-10-CM | POA: Diagnosis not present

## 2021-04-05 DIAGNOSIS — Z7901 Long term (current) use of anticoagulants: Secondary | ICD-10-CM | POA: Diagnosis not present

## 2021-04-05 DIAGNOSIS — B964 Proteus (mirabilis) (morganii) as the cause of diseases classified elsewhere: Secondary | ICD-10-CM | POA: Diagnosis present

## 2021-04-05 DIAGNOSIS — E8809 Other disorders of plasma-protein metabolism, not elsewhere classified: Secondary | ICD-10-CM | POA: Diagnosis present

## 2021-04-05 DIAGNOSIS — R809 Proteinuria, unspecified: Secondary | ICD-10-CM | POA: Diagnosis present

## 2021-04-05 DIAGNOSIS — T8130XA Disruption of wound, unspecified, initial encounter: Secondary | ICD-10-CM | POA: Diagnosis not present

## 2021-04-05 DIAGNOSIS — Z20822 Contact with and (suspected) exposure to covid-19: Secondary | ICD-10-CM | POA: Diagnosis present

## 2021-04-05 DIAGNOSIS — E11628 Type 2 diabetes mellitus with other skin complications: Secondary | ICD-10-CM | POA: Diagnosis present

## 2021-04-05 DIAGNOSIS — E1169 Type 2 diabetes mellitus with other specified complication: Secondary | ICD-10-CM | POA: Diagnosis present

## 2021-04-05 DIAGNOSIS — Z794 Long term (current) use of insulin: Secondary | ICD-10-CM

## 2021-04-05 DIAGNOSIS — Z83438 Family history of other disorder of lipoprotein metabolism and other lipidemia: Secondary | ICD-10-CM

## 2021-04-05 DIAGNOSIS — L089 Local infection of the skin and subcutaneous tissue, unspecified: Secondary | ICD-10-CM | POA: Diagnosis not present

## 2021-04-05 DIAGNOSIS — Z833 Family history of diabetes mellitus: Secondary | ICD-10-CM

## 2021-04-05 DIAGNOSIS — N182 Chronic kidney disease, stage 2 (mild): Secondary | ICD-10-CM | POA: Diagnosis present

## 2021-04-05 DIAGNOSIS — M869 Osteomyelitis, unspecified: Secondary | ICD-10-CM | POA: Diagnosis present

## 2021-04-05 DIAGNOSIS — Z8249 Family history of ischemic heart disease and other diseases of the circulatory system: Secondary | ICD-10-CM

## 2021-04-05 DIAGNOSIS — T797XXA Traumatic subcutaneous emphysema, initial encounter: Secondary | ICD-10-CM | POA: Diagnosis present

## 2021-04-05 DIAGNOSIS — Z8261 Family history of arthritis: Secondary | ICD-10-CM

## 2021-04-05 DIAGNOSIS — J4520 Mild intermittent asthma, uncomplicated: Secondary | ICD-10-CM | POA: Diagnosis present

## 2021-04-05 DIAGNOSIS — Z905 Acquired absence of kidney: Secondary | ICD-10-CM

## 2021-04-05 DIAGNOSIS — N179 Acute kidney failure, unspecified: Secondary | ICD-10-CM | POA: Diagnosis present

## 2021-04-05 DIAGNOSIS — W25XXXA Contact with sharp glass, initial encounter: Secondary | ICD-10-CM | POA: Diagnosis present

## 2021-04-05 DIAGNOSIS — M79671 Pain in right foot: Secondary | ICD-10-CM | POA: Diagnosis present

## 2021-04-05 DIAGNOSIS — N189 Chronic kidney disease, unspecified: Secondary | ICD-10-CM | POA: Diagnosis present

## 2021-04-05 DIAGNOSIS — I130 Hypertensive heart and chronic kidney disease with heart failure and stage 1 through stage 4 chronic kidney disease, or unspecified chronic kidney disease: Secondary | ICD-10-CM | POA: Diagnosis present

## 2021-04-05 DIAGNOSIS — E1122 Type 2 diabetes mellitus with diabetic chronic kidney disease: Secondary | ICD-10-CM | POA: Diagnosis present

## 2021-04-05 DIAGNOSIS — Z6841 Body Mass Index (BMI) 40.0 and over, adult: Secondary | ICD-10-CM

## 2021-04-05 DIAGNOSIS — Z881 Allergy status to other antibiotic agents status: Secondary | ICD-10-CM

## 2021-04-05 DIAGNOSIS — D509 Iron deficiency anemia, unspecified: Secondary | ICD-10-CM | POA: Diagnosis present

## 2021-04-05 DIAGNOSIS — M7989 Other specified soft tissue disorders: Secondary | ICD-10-CM | POA: Diagnosis not present

## 2021-04-05 DIAGNOSIS — Z89431 Acquired absence of right foot: Secondary | ICD-10-CM | POA: Diagnosis not present

## 2021-04-05 DIAGNOSIS — Z86711 Personal history of pulmonary embolism: Secondary | ICD-10-CM

## 2021-04-05 DIAGNOSIS — S91331A Puncture wound without foreign body, right foot, initial encounter: Secondary | ICD-10-CM | POA: Diagnosis present

## 2021-04-05 DIAGNOSIS — Z9889 Other specified postprocedural states: Secondary | ICD-10-CM | POA: Diagnosis not present

## 2021-04-05 DIAGNOSIS — E1165 Type 2 diabetes mellitus with hyperglycemia: Secondary | ICD-10-CM | POA: Diagnosis present

## 2021-04-05 DIAGNOSIS — R339 Retention of urine, unspecified: Secondary | ICD-10-CM

## 2021-04-05 DIAGNOSIS — A419 Sepsis, unspecified organism: Secondary | ICD-10-CM | POA: Diagnosis present

## 2021-04-05 DIAGNOSIS — R6520 Severe sepsis without septic shock: Secondary | ICD-10-CM | POA: Diagnosis present

## 2021-04-05 DIAGNOSIS — I5032 Chronic diastolic (congestive) heart failure: Secondary | ICD-10-CM | POA: Diagnosis present

## 2021-04-05 DIAGNOSIS — Z888 Allergy status to other drugs, medicaments and biological substances status: Secondary | ICD-10-CM

## 2021-04-05 DIAGNOSIS — Z880 Allergy status to penicillin: Secondary | ICD-10-CM

## 2021-04-05 HISTORY — DX: Cerebral infarction, unspecified: I63.9

## 2021-04-05 LAB — CBC WITH DIFFERENTIAL/PLATELET
Abs Immature Granulocytes: 0 10*3/uL (ref 0.00–0.07)
Basophils Absolute: 0 10*3/uL (ref 0.0–0.1)
Basophils Relative: 0 %
Eosinophils Absolute: 0 10*3/uL (ref 0.0–0.5)
Eosinophils Relative: 0 %
HCT: 27.7 % — ABNORMAL LOW (ref 36.0–46.0)
Hemoglobin: 8.6 g/dL — ABNORMAL LOW (ref 12.0–15.0)
Lymphocytes Relative: 6 %
Lymphs Abs: 1.6 10*3/uL (ref 0.7–4.0)
MCH: 26.8 pg (ref 26.0–34.0)
MCHC: 31 g/dL (ref 30.0–36.0)
MCV: 86.3 fL (ref 80.0–100.0)
Monocytes Absolute: 0.5 10*3/uL (ref 0.1–1.0)
Monocytes Relative: 2 %
Neutro Abs: 24.1 10*3/uL — ABNORMAL HIGH (ref 1.7–7.7)
Neutrophils Relative %: 92 %
Platelets: 546 10*3/uL — ABNORMAL HIGH (ref 150–400)
RBC: 3.21 MIL/uL — ABNORMAL LOW (ref 3.87–5.11)
RDW: 15.2 % (ref 11.5–15.5)
WBC: 26.2 10*3/uL — ABNORMAL HIGH (ref 4.0–10.5)
nRBC: 0 % (ref 0.0–0.2)

## 2021-04-05 LAB — LACTIC ACID, PLASMA
Lactic Acid, Venous: 1.6 mmol/L (ref 0.5–1.9)
Lactic Acid, Venous: 1.4 mmol/L (ref 0.5–1.9)

## 2021-04-05 LAB — TYPE AND SCREEN
ABO/RH(D): A POS
Antibody Screen: NEGATIVE

## 2021-04-05 LAB — COMPREHENSIVE METABOLIC PANEL
ALT: 18 U/L (ref 0–44)
AST: 16 U/L (ref 15–41)
Albumin: <1 g/dL — ABNORMAL LOW (ref 3.5–5.0)
Alkaline Phosphatase: 266 U/L — ABNORMAL HIGH (ref 38–126)
Anion gap: 8 (ref 5–15)
BUN: 19 mg/dL (ref 6–20)
CO2: 24 mmol/L (ref 22–32)
Calcium: 7.8 mg/dL — ABNORMAL LOW (ref 8.9–10.3)
Chloride: 102 mmol/L (ref 98–111)
Creatinine, Ser: 1.49 mg/dL — ABNORMAL HIGH (ref 0.44–1.00)
GFR, Estimated: 45 mL/min — ABNORMAL LOW (ref >60)
Glucose, Bld: 353 mg/dL — ABNORMAL HIGH (ref 70–99)
Potassium: 3.4 mmol/L — ABNORMAL LOW (ref 3.5–5.1)
Sodium: 134 mmol/L — ABNORMAL LOW (ref 135–145)
Total Bilirubin: 0.4 mg/dL (ref 0.3–1.2)
Total Protein: 5.9 g/dL — ABNORMAL LOW (ref 6.5–8.1)

## 2021-04-05 LAB — PROTIME-INR
INR: 1 (ref 0.8–1.2)
Prothrombin Time: 12.9 seconds (ref 11.4–15.2)

## 2021-04-05 LAB — CBG MONITORING, ED: Glucose-Capillary: 317 mg/dL — ABNORMAL HIGH (ref 70–99)

## 2021-04-05 LAB — RESP PANEL BY RT-PCR (FLU A&B, COVID) ARPGX2
Influenza A by PCR: NEGATIVE
Influenza B by PCR: NEGATIVE
SARS Coronavirus 2 by RT PCR: NEGATIVE

## 2021-04-05 LAB — I-STAT BETA HCG BLOOD, ED (MC, WL, AP ONLY): I-stat hCG, quantitative: <5 m[IU]/mL (ref <5)

## 2021-04-05 LAB — GLUCOSE, CAPILLARY: Glucose-Capillary: 248 mg/dL — ABNORMAL HIGH (ref 70–99)

## 2021-04-05 LAB — APTT: aPTT: 34 seconds (ref 24–36)

## 2021-04-05 IMAGING — CR DG FOOT COMPLETE 3+V*R*
3 series · 3 of 3 positions shown · non-contrast
Comparison: None.

CLINICAL DATA: Infection

EXAM:
RIGHT FOOT COMPLETE - 3+ VIEW

[x foot ap right]
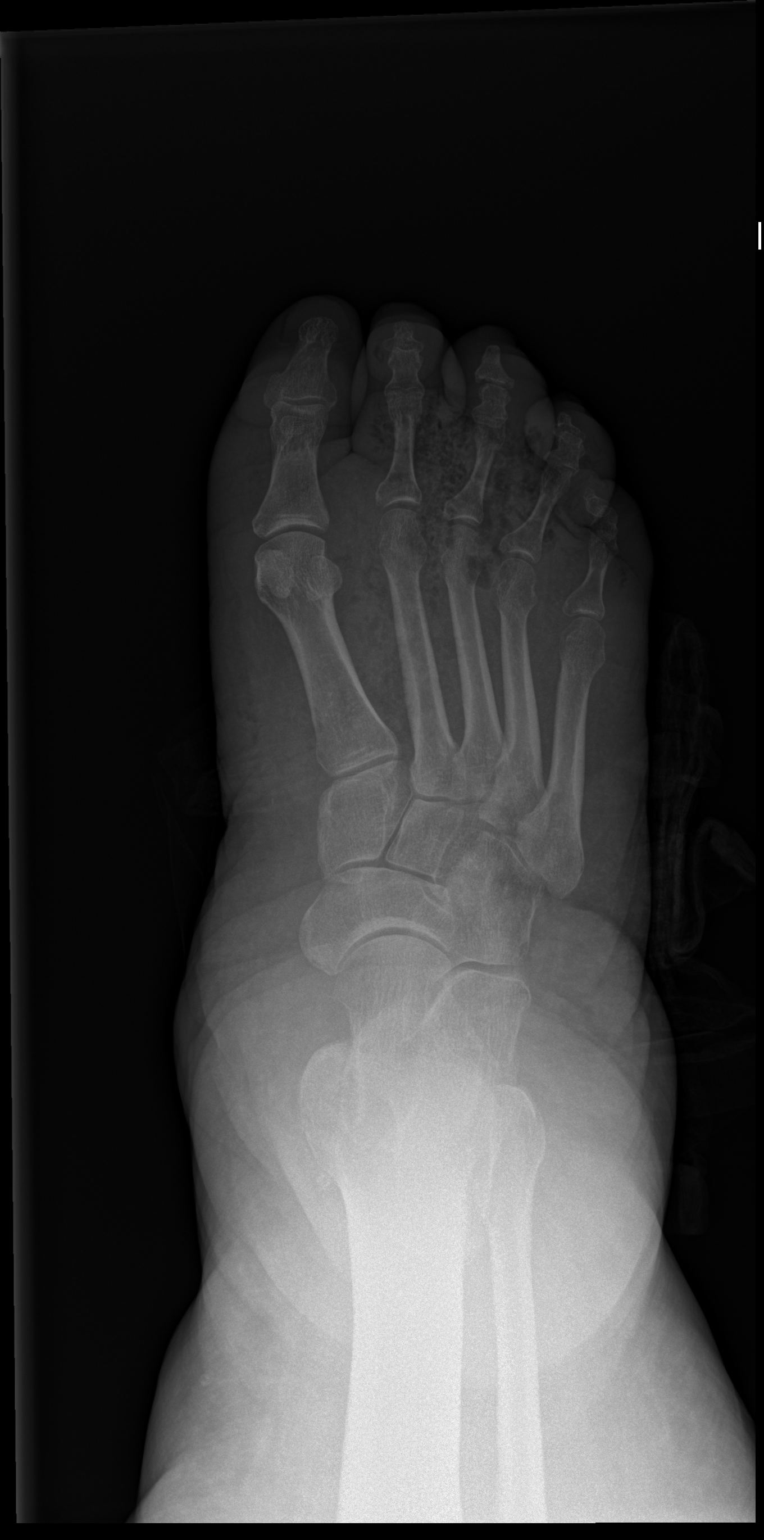

[x foot obl right]
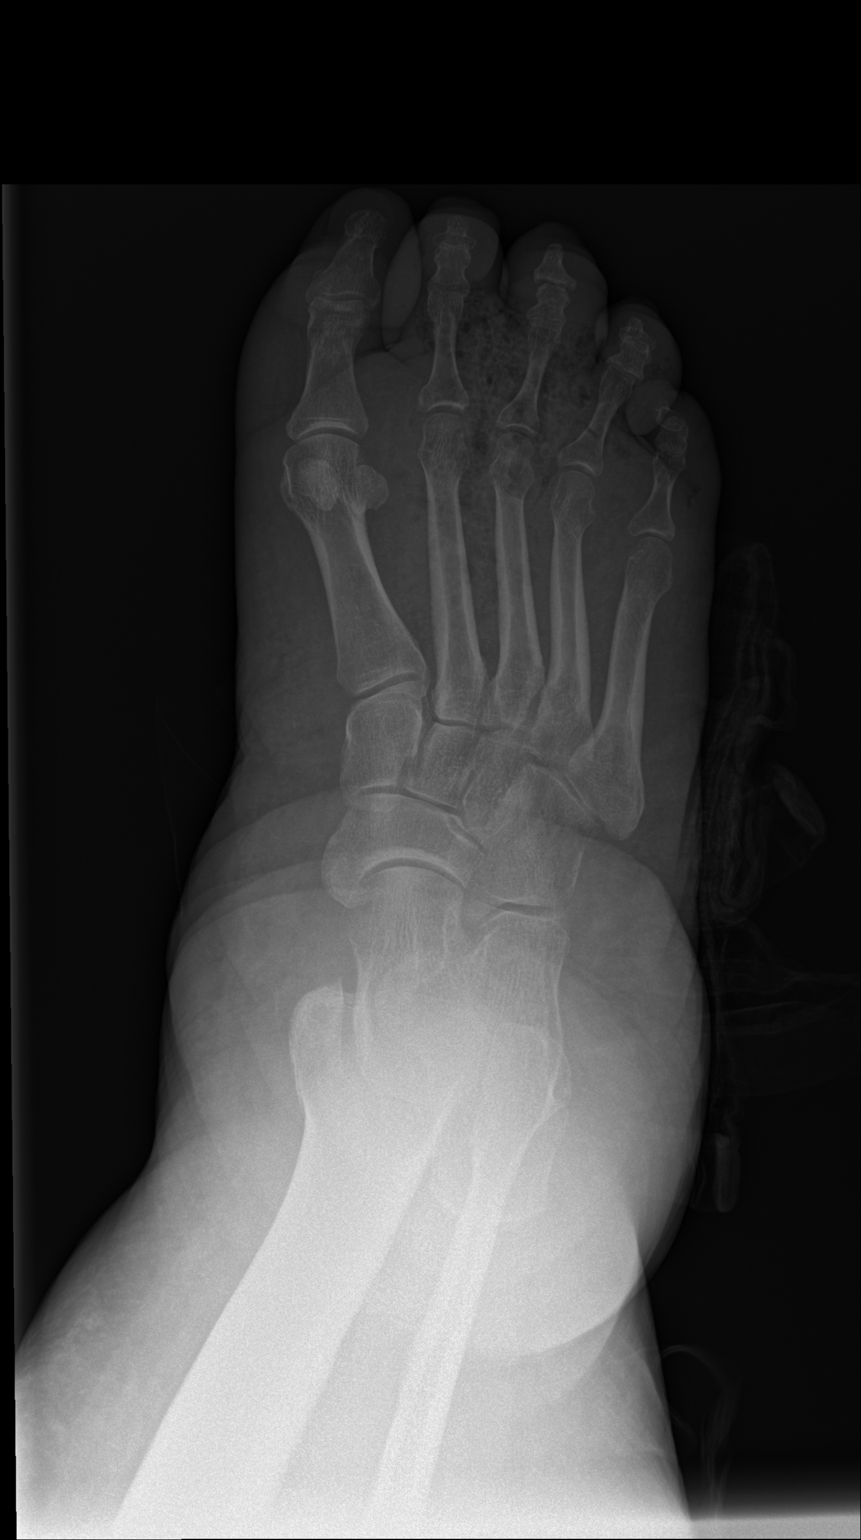

[w foot lat right]
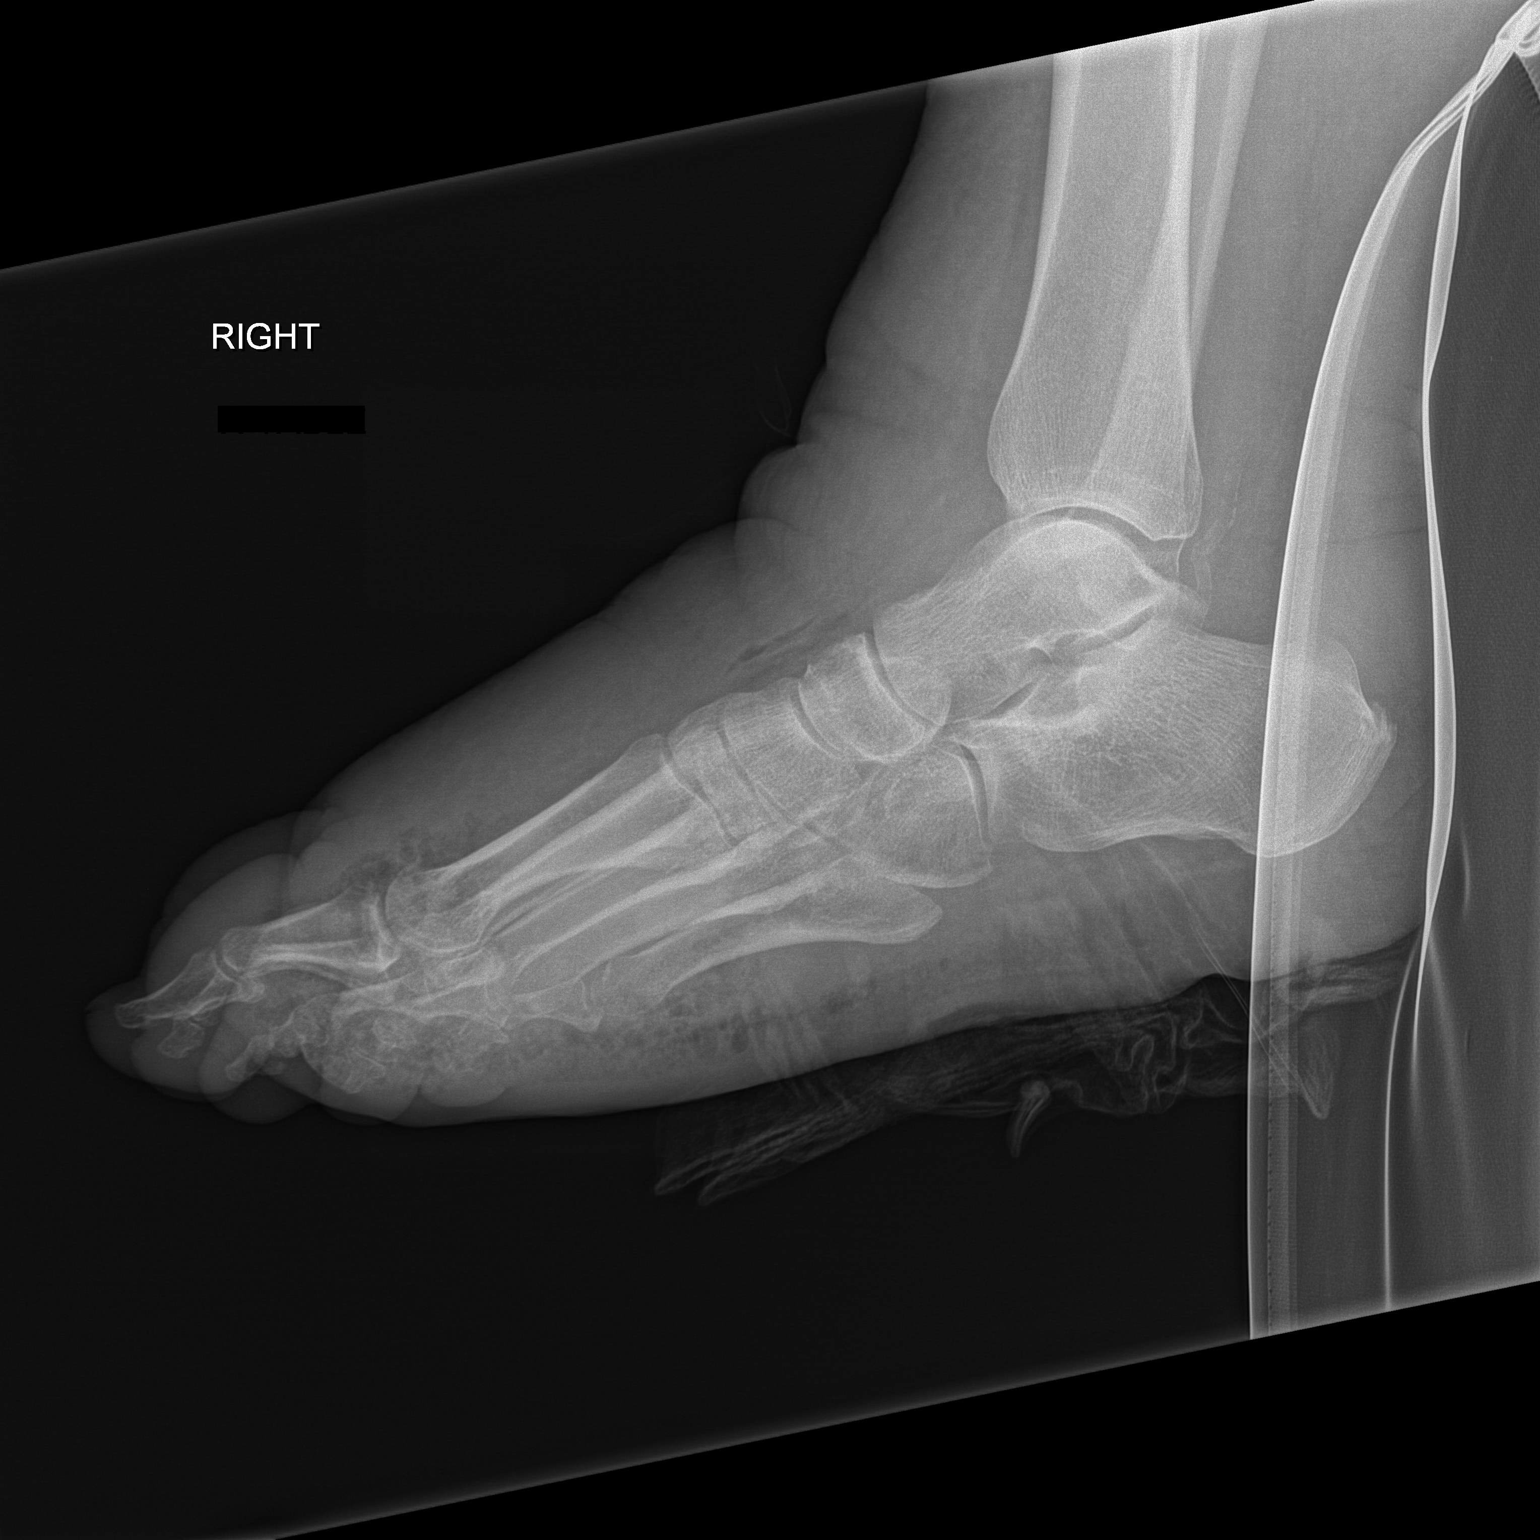

[3 of 3 positions shown; findings below may reference images not displayed]

FINDINGS: Vascular calcifications. No fracture or malalignment. No radiopaque
foreign body. Diffuse soft tissue swelling. Fairly extensive gas
within the soft tissues over the dorsum and plantar aspect of the
foot, centered over the second third and fourth distal digits.
IMPRESSION: Edema with fairly extensive soft tissue emphysema concerning for
necrotizing infection. No acute osseous abnormality

## 2021-04-05 MED ORDER — LACTATED RINGERS IV SOLN: INTRAVENOUS | Status: DC

## 2021-04-05 MED ORDER — ACETAMINOPHEN 650 MG RE SUPP: 650.0000 mg | Freq: Four times a day (QID) | RECTAL | Status: DC | PRN

## 2021-04-05 MED ORDER — PIPERACILLIN-TAZOBACTAM 3.375 G IVPB
3.3750 g | Freq: Three times a day (TID) | INTRAVENOUS | Status: DC
  Administered 2021-04-06 – 2021-04-11 (×16): 3.375 g via INTRAVENOUS
  Filled 2021-04-05 (×16): qty 50

## 2021-04-05 MED ORDER — VANCOMYCIN HCL 2000 MG/400ML IV SOLN
2000.0000 mg | Freq: Once | INTRAVENOUS | Status: AC
  Administered 2021-04-05: 2000 mg via INTRAVENOUS
  Filled 2021-04-05: qty 400

## 2021-04-05 MED ORDER — CLINDAMYCIN PHOSPHATE 600 MG/50ML IV SOLN
600.0000 mg | Freq: Three times a day (TID) | INTRAVENOUS | Status: AC
  Administered 2021-04-05 – 2021-04-08 (×9): 600 mg via INTRAVENOUS
  Filled 2021-04-05 (×9): qty 50

## 2021-04-05 MED ORDER — VANCOMYCIN HCL IN DEXTROSE 1-5 GM/200ML-% IV SOLN: 1000.0000 mg | Freq: Once | INTRAVENOUS | Status: DC

## 2021-04-05 MED ORDER — SODIUM CHLORIDE 0.9 % IV BOLUS (SEPSIS)
1000.0000 mL | Freq: Once | INTRAVENOUS | Status: AC
  Administered 2021-04-05: 1000 mL via INTRAVENOUS

## 2021-04-05 MED ORDER — ALBUTEROL SULFATE HFA 108 (90 BASE) MCG/ACT IN AERS
1.0000 | INHALATION_SPRAY | Freq: Four times a day (QID) | RESPIRATORY_TRACT | Status: DC | PRN
  Filled 2021-04-05: qty 6.7

## 2021-04-05 MED ORDER — ACETAMINOPHEN 325 MG PO TABS
650.0000 mg | ORAL_TABLET | Freq: Four times a day (QID) | ORAL | Status: DC | PRN
  Administered 2021-04-05 – 2021-04-09 (×6): 650 mg via ORAL
  Filled 2021-04-05 (×6): qty 2

## 2021-04-05 MED ORDER — SODIUM CHLORIDE 0.9 % IV SOLN: 2.0000 g | Freq: Once | INTRAVENOUS | Status: DC

## 2021-04-05 MED ORDER — SODIUM CHLORIDE 0.9 % IV BOLUS (SEPSIS)
600.0000 mL | Freq: Once | INTRAVENOUS | Status: AC
  Administered 2021-04-05: 600 mL via INTRAVENOUS

## 2021-04-05 MED ORDER — LACTATED RINGERS IV SOLN: INTRAVENOUS | Status: AC

## 2021-04-05 MED ORDER — POTASSIUM CHLORIDE CRYS ER 20 MEQ PO TBCR
40.0000 meq | EXTENDED_RELEASE_TABLET | Freq: Once | ORAL | Status: AC
  Administered 2021-04-05: 40 meq via ORAL
  Filled 2021-04-05: qty 2

## 2021-04-05 MED ORDER — VANCOMYCIN HCL 1500 MG/300ML IV SOLN: 1500.0000 mg | INTRAVENOUS | Status: DC

## 2021-04-05 MED ORDER — SODIUM CHLORIDE 0.9 % IV SOLN
2.0000 g | Freq: Once | INTRAVENOUS | Status: AC
  Administered 2021-04-05: 2 g via INTRAVENOUS
  Filled 2021-04-05: qty 2

## 2021-04-05 MED ORDER — SODIUM CHLORIDE 0.9 % IV SOLN: INTRAVENOUS | Status: AC

## 2021-04-05 MED ORDER — MORPHINE SULFATE (PF) 2 MG/ML IV SOLN
1.0000 mg | INTRAVENOUS | Status: DC | PRN
  Administered 2021-04-06: 1 mg via INTRAVENOUS
  Filled 2021-04-05: qty 1

## 2021-04-05 MED ORDER — HYDROMORPHONE HCL 1 MG/ML IJ SOLN
1.0000 mg | Freq: Once | INTRAMUSCULAR | Status: AC
  Administered 2021-04-05: 1 mg via INTRAVENOUS
  Filled 2021-04-05: qty 1

## 2021-04-05 MED ORDER — PIPERACILLIN-TAZOBACTAM 3.375 G IVPB 30 MIN: 3.3750 g | Freq: Once | INTRAVENOUS | Status: DC

## 2021-04-05 MED ORDER — INSULIN ASPART 100 UNIT/ML IJ SOLN
0.0000 [IU] | INTRAMUSCULAR | Status: DC
  Administered 2021-04-05: 3 [IU] via SUBCUTANEOUS
  Administered 2021-04-05: 7 [IU] via SUBCUTANEOUS
  Administered 2021-04-06 (×2): 1 [IU] via SUBCUTANEOUS
  Filled 2021-04-05: qty 0.09

## 2021-04-05 NOTE — ED Provider Notes (Signed)
[facility] DEPT Provider Note   CSN: [id number] Arrival date & time: 04/05/21  1544     History Chief Complaint  Patient presents with   Foot Pain    Sherri Dixon is a 40 y.o. female.  HPI  40 year old female with past medical history of obesity, DM, previous CVA, CHF, HTN presents the emergency department with right foot pain.  A week ago patient had glass stuck in the arch of her right foot.  Her husband removed it with a pair tweezers.  Since then she has been having right foot swelling, redness and subjective fever/chills.  She presents today with worsening pain and drainage from the arch of the right foot.  Denies any chest pain, shortness of breath, abdominal pain, vomiting/diarrhea.  Past Medical History:  Diagnosis Date   Anemia    Asthma    CHF (congestive heart failure) (HCC)    Pt reports that father has CHF, not her   Diabetes mellitus without complication ([place])    Dyslipidemia    GERD (gastroesophageal reflux disease)    Pulmonary embolism ([place])    Stroke [facility])     Patient Active Problem List   Diagnosis Date Noted   Right foot infection 04/05/2021   Acute CVA (cerebrovascular accident) ([place]) 01/25/2021   Acute on chronic congestive heart failure ([place]) 12/12/2020   Diabetes mellitus type 2 in obese ([place]) 12/12/2020   Hypertensive urgency 12/12/2020   B12 deficiency anemia 03/11/2019   Iron deficiency anemia due to chronic blood loss 03/10/2019   Anemia 03/10/2019   Pulmonary emboli ([place]) 05/22/2018   Obesity, Class III, BMI 40-49.9 (morbid obesity) ([place]) 05/22/2018   Eczema 05/22/2018   Morbid obesity ([place]) 06/06/2016   Uncontrolled diabetes mellitus with hyperglycemia, with long-term current use of insulin ([place]) 06/06/2016   Hypokalemia 06/06/2016   Diabetic ketoacidosis without coma associated with type 1 diabetes mellitus ([place])    DKA (diabetic ketoacidoses) 06/05/2016   GERD (gastroesophageal reflux disease)      Past Surgical History:  Procedure Laterality Date   MOUTH SURGERY     ROBOTIC ASSITED PARTIAL NEPHRECTOMY Right 07/28/2019   Procedure: XI ROBOTIC ASSITED PARTIAL NEPHRECTOMY;  Surgeon: Cleon Gustin, MD;  Location: [facility];  Service: Urology;  Laterality: Right;  3 HRS     OB History   No obstetric history on file.     Family History  Problem Relation Age of Onset   High blood pressure Mother    Diabetes Mother    Arthritis Mother    Hyperlipidemia Mother    High blood pressure Father    Hyperlipidemia Father    High blood pressure Sister    Diabetes Brother    Hyperlipidemia Brother    High blood pressure Brother     Social History   Tobacco Use   Smoking status: Never   Smokeless tobacco: Never  Vaping Use   Vaping Use: Never used  Substance Use Topics   Alcohol use: Yes    Alcohol/week: 1.0 standard drink    Types: 1 Glasses of wine per week    Comment: occas   Drug use: No    Home Medications Prior to Admission medications   Medication Sig Start Date End Date Taking? Authorizing Provider  Alcohol Swabs 70 % PADS 1 application by Does not apply route 3 (three) times daily before meals. 06/06/16   Dhungel, Flonnie Overman, MD  cetirizine (ZYRTEC) 10 MG tablet Take 10 mg by mouth daily as needed for allergies.  [provider]  cyanocobalamin 1000 MCG tablet Take 1,000 mcg by mouth daily. 07/25/20   [provider]  ferrous sulfate 325 (65 FE) MG EC tablet Take 1 tablet (325 mg total) by mouth 2 (two) times daily. 12/15/20 04/14/21  Bonnielee Haff, MD  furosemide (LASIX) 40 MG tablet Take 2 tablets twice a day for 5 days, then take 1 tablet twice a day till you see your PCP. Patient taking differently: Take 80 mg by mouth 2 (two) times daily. Take 2 tablets twice a day for 5 days, then take 1 tablet twice a day till you see your PCP. 12/15/20   Bonnielee Haff, MD  gabapentin (NEURONTIN) 300 MG capsule Take 300 mg by mouth daily as needed (pain).     [provider]  glucose blood test strip Use as instructed 06/06/16   Dhungel, Nishant, MD  hydrALAZINE (APRESOLINE) 50 MG tablet Take 1 tablet (50 mg total) by mouth 3 (three) times daily. 12/15/20   Bonnielee Haff, MD  insulin aspart (NOVOLOG FLEXPEN) 100 UNIT/ML FlexPen Inject 12-20 Units as directed 3 (three) times daily. Sliding scale 01/03/19   [provider]  Insulin Glargine (BASAGLAR KWIKPEN) 100 UNIT/ML Inject 50 Units into the skin at bedtime. Patient taking differently: Inject 50 Units into the skin at bedtime. Sliding scale 12/15/20   Bonnielee Haff, MD  Insulin Syringe-Needle U-100 (INSULIN SYRINGE 1CC/28G) 28G X 1/2" 1 ML MISC 1 application by Does not apply route 3 (three) times daily before meals. 06/06/16   Dhungel, Flonnie Overman, MD  metoprolol succinate (TOPROL-XL) 100 MG 24 hr tablet Take 1 tablet (100 mg total) by mouth daily. 12/15/20   Bonnielee Haff, MD  Multiple Vitamin (MULTIVITAMIN WITH MINERALS) TABS tablet Take 1 tablet by mouth daily. Centrum    [provider]  nitroGLYCERIN (NITROSTAT) 0.4 MG SL tablet Place 0.4 mg under the tongue every 5 (five) minutes as needed for chest pain. 04/17/19   [provider]  olmesartan (BENICAR) 40 MG tablet Take 1 tablet (40 mg total) by mouth daily. 01/30/21   Thurnell Lose, MD  potassium chloride SA (KLOR-CON) 20 MEQ tablet Take 20 mEq by mouth daily.    [provider]  rosuvastatin (CRESTOR) 40 MG tablet Take 1 tablet (40 mg total) by mouth daily. 01/29/21   Thurnell Lose, MD  XARELTO 20 MG TABS tablet Take 20 mg by mouth daily. 10/15/20   [provider]    Allergies    Amlodipine, Amoxicillin, and Humalog kwikpen [insulin lispro]  Review of Systems   Review of Systems  Constitutional:  Positive for chills, fatigue and fever.  HENT:  Negative for congestion.   Eyes:  Negative for visual disturbance.  Respiratory:  Negative for shortness of breath.   Cardiovascular:   Negative for chest pain.  Gastrointestinal:  Negative for abdominal pain, diarrhea and vomiting.  Genitourinary:  Negative for dysuria.  Musculoskeletal:        + right foot pain, swelling, redness, drainage  Skin:  Negative for rash.  Neurological:  Negative for headaches.   Physical Exam Updated Vital Signs BP 125/61   Pulse (!) 111   Temp 98.1 F (36.7 C) (Oral)   Resp 20   Ht 5\' 2"  (1.575 m)   Wt (!) 154.2 kg   LMP 04/05/2021 (Exact Date)   SpO2 99%   BMI 62.19 kg/m   Physical Exam Vitals and nursing note reviewed.  Constitutional:      Appearance: Normal appearance. She  is obese.  HENT:     Head: Normocephalic.     Mouth/Throat:     Mouth: Mucous membranes are moist.  Cardiovascular:     Rate and Rhythm: Tachycardia present.  Pulmonary:     Effort: Pulmonary effort is normal. No respiratory distress.  Abdominal:     Palpations: Abdomen is soft.     Tenderness: There is no abdominal tenderness.  Musculoskeletal:     Comments: Baseline large obese legs/feet, right foot is more edematous than the left, scattered erythema, warm to the touch, feet are so large with chronically thickened skin I am unable to palpate a DP pulse bilaterally but capillary refill appears intact, leg does not appear acutely ischemic, she is able to move it, at the arch of the right foot is an area that appears to be where they remove the glass, at the ball of the foot around toe 2 and 3 there is ecchymosis and dark tissue, no drainage noted  Skin:    General: Skin is warm.  Neurological:     Mental Status: She is alert and oriented to person, place, and time. Mental status is at baseline.  Psychiatric:        Mood and Affect: Mood normal.    ED Results / Procedures / Treatments   Labs (all labs ordered are listed, but only abnormal results are displayed) Labs Reviewed  COMPREHENSIVE METABOLIC PANEL - Abnormal; Notable for the following components:      Result Value   Sodium 134 (*)     Potassium 3.4 (*)    Glucose, Bld 353 (*)    Creatinine, Ser 1.49 (*)    Calcium 7.8 (*)    Total Protein 5.9 (*)    Albumin <1.0 (*)    Alkaline Phosphatase 266 (*)    GFR, Estimated 45 (*)    All other components within normal limits  CBC WITH DIFFERENTIAL/PLATELET - Abnormal; Notable for the following components:   WBC 26.2 (*)    RBC 3.21 (*)    Hemoglobin 8.6 (*)    HCT 27.7 (*)    Platelets 546 (*)    Neutro Abs 24.1 (*)    All other components within normal limits  RESP PANEL BY RT-PCR (FLU A&B, COVID) ARPGX2  CULTURE, BLOOD (ROUTINE X 2)  CULTURE, BLOOD (ROUTINE X 2)  LACTIC ACID, PLASMA  PROTIME-INR  APTT  LACTIC ACID, PLASMA  CBC  MAGNESIUM  BASIC METABOLIC PANEL  I-STAT BETA HCG BLOOD, ED ([facility], [facility], [facility] ONLY)  TYPE AND SCREEN    EKG EKG Interpretation  Date/Time:  Friday April 05 2021 16:56:08 EDT Ventricular Rate:  111 PR Interval:  130 QRS Duration: 72 QT Interval:  358 QTC Calculation: 486 R Axis:   98 Text Interpretation: Sinus tachycardia Rightward axis Borderline ECG Sinus tachycardia Confirmed by Lavenia Atlas [phone]) on 04/05/2021 7:10:44 PM  Radiology DG Foot Complete Right  Result Date: 04/05/2021 CLINICAL DATA:  Infection EXAM: RIGHT FOOT COMPLETE - 3+ VIEW COMPARISON:  None. FINDINGS: Vascular calcifications. No fracture or malalignment. No radiopaque foreign body. Diffuse soft tissue swelling. Fairly extensive gas within the soft tissues over the dorsum and plantar aspect of the foot, centered over the second third and fourth distal digits. IMPRESSION: Edema with fairly extensive soft tissue emphysema concerning for necrotizing infection. No acute osseous abnormality Electronically Signed   By: Donavan Foil M.D.   On: 04/05/2021 18:01    Procedures .Critical Care  Date/Time: 04/05/2021 9:34 PM Performed by: Dina Rich,  Alvin Critchley, DO Authorized by: Lorelle Gibbs, DO   Critical care provider statement:    Critical care time (minutes):  45    Critical care was necessary to treat or prevent imminent or life-threatening deterioration of the following conditions:  Sepsis   Critical care was time spent personally by me on the following activities:  Discussions with consultants, evaluation of patient's response to treatment, examination of patient, ordering and performing treatments and interventions, ordering and review of laboratory studies, ordering and review of radiographic studies, pulse oximetry, re-evaluation of patient's condition, obtaining history from patient or surrogate and review of old charts   I assumed direction of critical care for this patient from another provider in my specialty: no     Care discussed with: admitting provider     Medications Ordered in ED Medications  sodium chloride 0.9 % bolus 1,000 mL (1,000 mLs Intravenous New Bag/Given 04/05/21 2025)    And  sodium chloride 0.9 % bolus 600 mL (has no administration in time range)  vancomycin (VANCOREADY) IVPB 2000 mg/400 mL (has no administration in time range)  lactated ringers infusion (has no administration in time range)  acetaminophen (TYLENOL) tablet 650 mg (has no administration in time range)    Or  acetaminophen (TYLENOL) suppository 650 mg (has no administration in time range)  potassium chloride SA (KLOR-CON) CR tablet 40 mEq (has no administration in time range)  morphine 2 MG/ML injection 1 mg (has no administration in time range)  insulin aspart (novoLOG) injection 0-9 Units (has no administration in time range)  albuterol (VENTOLIN HFA) 108 (90 Base) MCG/ACT inhaler 1-2 puff (has no administration in time range)  HYDROmorphone (DILAUDID) injection 1 mg (1 mg Intravenous Given 04/05/21 2015)  ceFEPIme (MAXIPIME) 2 g in sodium chloride 0.9 % 100 mL IVPB (2 g Intravenous New Bag/Given 04/05/21 2018)    ED Course  I have reviewed the triage vital signs and the nursing notes.  Pertinent labs & imaging results that were available during my care of the  patient were reviewed by me and considered in my medical decision making (see chart for details).    MDM Rules/Calculators/A&P                          40 year old female presents the emergency department with right foot pain, redness and drainage.  She is tachycardic on arrival but afebrile.  The right foot is swollen, edematous, erythematous, appears vascularly intact.  Blood work shows a leukocytosis, normal lactic.  X-ray is concerning for subcutaneous air/emphysematous changes concerning for necrotizing infection.  Spoke with on-call PA for orthopedics, they will come evaluate the patient.  Recommended antibiotics and medical admission.  Patient has been treated per septic protocol with fluids and antibiotics.  Patients evaluation and results requires admission for further treatment and care. Patient agrees with admission plan, offers no new complaints and is stable/unchanged at time of admit.  Final Clinical Impression(s) / ED Diagnoses Final diagnoses:  Necrotizing soft tissue infection  Sepsis, due to unspecified organism, unspecified whether acute organ dysfunction present [facility])    Rx / DC Orders ED Discharge Orders     None        Lorelle Gibbs, DO 04/05/21 2134

## 2021-04-05 NOTE — Progress Notes (Signed)
A consult was received from an ED physician for vancomycin and cefepime per pharmacy dosing.  The patient's profile has been reviewed for ht/wt/allergies/indication/available labs.   A one time order has been placed for vancomycin 2g and cefepime 2g.  Further antibiotics/pharmacy consults should be ordered by admitting physician if indicated.                       Thank you, Peggyann Juba, PharmD, BCPS 04/05/2021  7:44 PM

## 2021-04-05 NOTE — Consult Note (Signed)
ORTHOPAEDIC CONSULTATION  REQUESTING PHYSICIAN: Shela Leff, MD  Chief Complaint: right foot infection  HPI: Sherri Dixon is a 40 y.o. female with medical history significant of asthma, chronic diastolic CHF, hypertension, hyperlipidemia, insulin-dependent diabetes melitis, chronic microcytic anemia, GERD, history of PE on Xarelto, morbid obesity (BMI 62.19), admitted for stroke in May 2022 presented to the ED with complaints of right foot pain, swelling, and drainage after she stepped on a piece of glass a week ago. She presented to [facility] Emergency Department. X-rays showed soft tissue emphysema. Orthopedics was consulted.  Patient seen in [facility] ED room 18. She notes she has had worsening pain in the foot over the past two days. She also started feeling fatigued and febrile. She notes she has had drainage for the past 2 days from the foot. She previously has seen a podiatrist for bunions but no prior surgical history for the feet. She ambulates without assistance. She has not been able to take medications for diabetes or blood thinners as prescribed due to her nausea and lack of appetite. Her sister is at bedside. Unsure of her last dose of Xarelto.   Past Medical History:  Diagnosis Date   Anemia    Asthma    CHF (congestive heart failure) (HCC)    Pt reports that father has CHF, not her   Diabetes mellitus without complication ([place])    Dyslipidemia    GERD (gastroesophageal reflux disease)    Pulmonary embolism (HCC)    Stroke [facility])    Past Surgical History:  Procedure Laterality Date   MOUTH SURGERY     ROBOTIC ASSITED PARTIAL NEPHRECTOMY Right 07/28/2019   Procedure: XI ROBOTIC ASSITED PARTIAL NEPHRECTOMY;  Surgeon: Cleon Gustin, MD;  Location: [facility] ORS;  Service: Urology;  Laterality: Right;  3 HRS   Social History   Socioeconomic History   Marital status: Married    Spouse name: Not on file   Number of children: Not on file   Years of  education: Not on file   Highest education level: Not on file  Occupational History   Not on file  Tobacco Use   Smoking status: Never   Smokeless tobacco: Never  Vaping Use   Vaping Use: Never used  Substance and Sexual Activity   Alcohol use: Yes    Alcohol/week: 1.0 standard drink    Types: 1 Glasses of wine per week    Comment: occas   Drug use: No   Sexual activity: Yes    Birth control/protection: None  Other Topics Concern   Not on file  Social History Narrative   Not on file   Social Determinants of Health   Financial Resource Strain: Not on file  Food Insecurity: Not on file  Transportation Needs: Not on file  Physical Activity: Not on file  Stress: Not on file  Social Connections: Not on file   Family History  Problem Relation Age of Onset   High blood pressure Mother    Diabetes Mother    Arthritis Mother    Hyperlipidemia Mother    High blood pressure Father    Hyperlipidemia Father    High blood pressure Sister    Diabetes Brother    Hyperlipidemia Brother    High blood pressure Brother    Allergies  Allergen Reactions   Amlodipine Swelling   Humalog Kwikpen [Insulin Lispro] Other (See Comments)    75/50---swelling and ulcers in mouth    Amoxicillin  constipation   Prior to Admission medications   Medication Sig Start Date End Date Taking? Authorizing Provider  cetirizine (ZYRTEC) 10 MG tablet Take 10 mg by mouth daily as needed for allergies.   Yes [provider]  FARXIGA 10 MG TABS tablet Take 10 mg by mouth daily as needed. 03/21/21  Yes [provider]  furosemide (LASIX) 40 MG tablet Take 2 tablets twice a day for 5 days, then take 1 tablet twice a day till you see your PCP. Patient taking differently: Take 80 mg by mouth 2 (two) times daily. Take 2 tablets twice a day for 5 days, then take 1 tablet twice a day till you see your PCP. 12/15/20  Yes Bonnielee Haff, MD  gabapentin (NEURONTIN) 300 MG capsule Take 300 mg by  mouth daily as needed (pain).   Yes [provider]  hydrALAZINE (APRESOLINE) 50 MG tablet Take 1 tablet (50 mg total) by mouth 3 (three) times daily. 12/15/20  Yes Bonnielee Haff, MD  ibuprofen (ADVIL) 200 MG tablet Take 200 mg by mouth every 6 (six) hours as needed for moderate pain.   Yes [provider]  insulin aspart (NOVOLOG FLEXPEN) 100 UNIT/ML FlexPen Inject 12-20 Units as directed 3 (three) times daily. Sliding scale 01/03/19  Yes [provider]  Insulin Glargine (BASAGLAR KWIKPEN) 100 UNIT/ML Inject 50 Units into the skin at bedtime. Patient taking differently: Inject 40-50 Units into the skin at bedtime. Sliding scale 12/15/20  Yes Bonnielee Haff, MD  metoprolol succinate (TOPROL-XL) 100 MG 24 hr tablet Take 1 tablet (100 mg total) by mouth daily. 12/15/20  Yes Bonnielee Haff, MD  Multiple Vitamin (MULTIVITAMIN WITH MINERALS) TABS tablet Take 1 tablet by mouth daily. Centrum   Yes [provider]  nitroGLYCERIN (NITROSTAT) 0.4 MG SL tablet Place 0.4 mg under the tongue every 5 (five) minutes as needed for chest pain. 04/17/19  Yes [provider]  olmesartan (BENICAR) 40 MG tablet Take 1 tablet (40 mg total) by mouth daily. 01/30/21  Yes Thurnell Lose, MD  Pediatric Multivitamins-Iron Vicki Mallet Angelina Pih) 18 MG CHEW Chew 1 tablet by mouth daily. 02/02/21  Yes [provider]  potassium chloride SA (KLOR-CON) 20 MEQ tablet Take 20 mEq by mouth daily.   Yes [provider]  rosuvastatin (CRESTOR) 40 MG tablet Take 1 tablet (40 mg total) by mouth daily. 01/29/21  Yes Thurnell Lose, MD  Vitamin D, Ergocalciferol, (DRISDOL) 1.25 MG (50000 UNIT) CAPS capsule Take 50,000 Units by mouth once a week. 03/19/21  Yes [provider]  XARELTO 20 MG TABS tablet Take 20 mg by mouth daily. 10/15/20  Yes [provider]  Alcohol Swabs 70 % PADS 1 application by Does not apply route 3 (three) times daily before meals. 06/06/16    Dhungel, Nishant, MD  ferrous sulfate 325 (65 FE) MG EC tablet Take 1 tablet (325 mg total) by mouth 2 (two) times daily. Patient not taking: Reported on 04/05/2021 12/15/20 04/14/21  Bonnielee Haff, MD  glucose blood test strip Use as instructed 06/06/16   Dhungel, Nishant, MD  Insulin Syringe-Needle U-100 (INSULIN SYRINGE 1CC/28G) 28G X 1/2" 1 ML MISC 1 application by Does not apply route 3 (three) times daily before meals. 06/06/16   Louellen Molder, MD   DG Foot Complete Right  Result Date: 04/05/2021 CLINICAL DATA:  Infection EXAM: RIGHT FOOT COMPLETE - 3+ VIEW COMPARISON:  None. FINDINGS: Vascular calcifications. No fracture or malalignment. No radiopaque foreign body. Diffuse soft tissue  swelling. Fairly extensive gas within the soft tissues over the dorsum and plantar aspect of the foot, centered over the second third and fourth distal digits. IMPRESSION: Edema with fairly extensive soft tissue emphysema concerning for necrotizing infection. No acute osseous abnormality Electronically Signed   By: Donavan Foil M.D.   On: 04/05/2021 18:01   Family History Reviewed and non-contributory, no pertinent history of problems with bleeding or anesthesia      Review of Systems 14 system ROS conducted and negative except for that noted in HPI   OBJECTIVE  Vitals:Patient Vitals for the past 8 hrs:  BP Temp Temp src Pulse Resp SpO2 Height Weight  04/05/21 1945 125/61 -- -- (!) 111 20 99 % -- --  04/05/21 1917 132/87 -- -- (!) 113 16 100 % -- --  04/05/21 1900 (!) 77/67 -- -- (!) 106 -- 100 % -- --  04/05/21 1637 -- -- -- -- -- -- _0  (1.575 m) (!) 154.2 kg  04/05/21 1633 (!) 153/116 98.1 F (36.7 C) Oral (!) 114 18 99 % -- --   General: Alert, no acute distress Cardiovascular: Warm extremities noted, chronic edema bilateral lower extremities Respiratory: No cyanosis, no use of accessory musculature GI: No organomegaly, abdomen is soft and non-tender Skin: No lesions in the area of chief  complaint other than those listed below in MSK exam.  Neurologic: Sensation intact distally save for the below mentioned MSK exam Psychiatric: Patient is competent for consent with normal mood and affect Lymphatic: No swelling obvious and reported other than the area involved in the exam below  Extremities  Bilateral upper extremities: Actively moves bilateral upper extremities without pain. Non-tender to palpation shoulder, elbow, wrist, hand. NVI   LLE: non-tender to palpation hip, knee, ankle, foot.. Actively moves right lower extremity without pain. + GS/TA/EHL. Sensation intact in DP/SP/S/S/P distributions. 2+ DP pulse with warm and well perfused digits. Compartments soft and compressible, with no pain on passive stretch. Chronic edema of the lower extremties  RLE:      Erythema does not extend past the ankle, non-tender right calf. She wiggles all toes slightly. Endorses distal sensation. DP +2. Chronic edema of the right lower extremity, more significant in right foot compared to left. No crepitus noted. Tenderness worse At base of 2/3/4 toes.     Test Results Imaging Xrays of right foot demonstrate soft tissue emphysema plantar aspect of 2, 3, 4th digits  Labs cbc Recent Labs    04/05/21 1722  WBC 26.2*  HGB 8.6*  HCT 27.7*  PLT 546*    Labs inflam No results for input(s): CRP in the last 72 hours.  Invalid input(s): ESR  Labs coag Recent Labs    04/05/21 1722  INR 1.0    Recent Labs    04/05/21 1722  NA 134*  K 3.4*  CL 102  CO2 24  GLUCOSE 353*  BUN 19  CREATININE 1.49*  CALCIUM 7.8*     ASSESSMENT AND PLAN: 40 y.o. female with the following: Right foot infection with secondary sepsis  Orthopedics recommends admission to a medical service and we will provide consultation and follow along. Patient has been started on IV antibiotics. Discussed the concerning nature of the infection. Discussed the concerns for worsening infection and symptoms which  can lead to worsening sepsis. Recommend surgical intervention. We will have the patient transferred to [facility]. We discussed this case with Dr. Lucia Gaskins, foot and ankle specialist, who will see the patient tomorrow.  I discussed surgical intervention could include irrigation and debridement, transmetatarsal amputation, or BKA. We will defer to Dr. Lucia Gaskins for the final decision regarding this. Patient states her understanding and is in agreement for surgical intervention and transfer. Her sister, who was at bedside, also stated her understanding and was in agreement with the plan.   - Weight Bearing Status/Activity: NWB RLE - Additional recommendations: Transfer to [facility] - VTE Prophylaxis: SCDs. Hold Xarelto.  - Pain control: PRN pain medications - Procedures: Likely surgical intervention tomorrow - NPO at midnight   [facility]. PA-C 04/05/21

## 2021-04-05 NOTE — ED Triage Notes (Signed)
Patient reports that she got a piece of glass in her right foot last week and states that her husband removed a piece of glass. Patient c/o pain and is having brown drainage from the right foot.

## 2021-04-05 NOTE — ED Notes (Signed)
[facility]ed [facility] for transport

## 2021-04-05 NOTE — ED Notes (Signed)
Called report to  [facility]  

## 2021-04-05 NOTE — Progress Notes (Signed)
Code Sepsis tracking by [facility] 

## 2021-04-05 NOTE — ED Notes (Signed)
Called report to [place], Therapist, sports on 6N at [facility]

## 2021-04-05 NOTE — Progress Notes (Signed)
Pharmacy Antibiotic Note  Sherri Dixon is a 40 y.o. female admitted on 04/05/2021 with necrotizing soft tissue infection of the right foot.  Orthopedics consulted. Pharmacy has been consulted for vancomycin and zosyn dosing.  Plan: Clindamycin per MD Zosyn 3.375gm IV q8h (4hr extended infusions) Vancomycin 2g x 1, then 1500mg  IV q24h for estimated AUC 531 using SCr 1.49, Vd 0.5 Follow up renal function & cultures   Height: 5\' 2"  (157.5 cm) Weight: (!) 154.2 kg (340 lb) IBW/kg (Calculated) : 50.1  Temp (24hrs), Avg:98.1 F (36.7 C), Min:98.1 F (36.7 C), Max:98.1 F (36.7 C)  Recent Labs  Lab 04/05/21 1722 04/05/21 1839  WBC 26.2*  --   CREATININE 1.49*  --   LATICACIDVEN 1.6 1.4    Estimated Creatinine Clearance: 71.2 mL/min (A) (by C-G formula based on SCr of 1.49 mg/dL (H)).    Allergies  Allergen Reactions   Amlodipine Swelling   Humalog Kwikpen [Insulin Lispro] Other (See Comments)    75/50---swelling and ulcers in mouth    Amoxicillin     constipation    Antimicrobials this admission: 7/8 Cefepime x 1 7/8 Vancomycin >> 7/8 Zosyn >> 7/8 Clinda >> (7/11)  Dose adjustments this admission:  Microbiology results: 7/8 BCx: 7/8 UCx: 7/8 COVID: neg 7/8 Flu: neg  Thank you for allowing pharmacy to be a part of this patient's care.  Peggyann Juba, PharmD, BCPS Pharmacy: [phone] 04/05/2021 9:40 PM

## 2021-04-05 NOTE — H&P (Signed)
History and Physical    Sherri Dixon AJO:[id number] DOB: 1981/04/25 DOA: 04/05/2021  PCP: Tomasa Hose, NP Patient coming from: Home  Chief Complaint: Right foot pain, swelling, drainage  HPI: Sherri Dixon is a 40 y.o. female with medical history significant of asthma, chronic diastolic CHF, hypertension, hyperlipidemia, insulin-dependent diabetes melitis, chronic microcytic anemia, GERD, history of PE on Xarelto, morbid obesity (BMI 62.19), admitted for stroke in May 2022 presented to the ED with complaints of right foot pain, swelling, and drainage after she stepped on a piece of glass a week ago.  In the ED, tachycardic on arrival but remainder of vital signs stable.  Labs showing WBC 94.7 with neutrophilic predominance, hemoglobin 8.6 (stable), platelet count 546K.  Sodium 134, potassium 3.4, chloride 102, bicarb 24, anion gap 8, BUN 19, creatinine 1.4 (baseline 1.0), glucose 353.  Calcium 7.8, albumin <1.0.  Alk phos 266, remainder of LFTs normal.  Initial lactic acid 1.6, repeat pending.  INR 1.0.  Beta-hCG negative.  1 set of blood culture drawn and pending.  Screening COVID and influenza PCR pending.  X-ray of the right foot showing edema with fairly extensive soft tissue emphysema concerning for necrotizing infection; no radiopaque foreign body; no acute osseous abnormality. Patient was given Dilaudid, cefepime, vancomycin, and 1.6 L fluid boluses.  ED physician discussed the case with Dr. Griffin Basil, orthopedics will see the patient tonight.  Patient reports progressively worsening pain, swelling, and drainage of pus from the bottom of her right foot after she stepped on a piece of glass over a week ago.  Reports subjective fevers and poor appetite.  She has felt nauseous but not vomited.  Due to poor appetite, she has not taken Basaglar this past week and has only been using sliding scale insulin as needed.  Does endorse drinking cranberry and other fruit juices.  Reports chronic  intermittent shortness of breath due to history of CHF, no recent change.  No shortness of breath at present.  Denies cough or chest pain.  Denies abdominal pain or diarrhea.  She has received 2 doses of the COVID-vaccine.  Review of Systems:  All systems reviewed and apart from history of presenting illness, are negative.  Past Medical History:  Diagnosis Date   Anemia    Asthma    CHF (congestive heart failure) (HCC)    Pt reports that father has CHF, not her   Diabetes mellitus without complication ([place])    Dyslipidemia    GERD (gastroesophageal reflux disease)    Pulmonary embolism (HCC)    Stroke [facility])     Past Surgical History:  Procedure Laterality Date   MOUTH SURGERY     ROBOTIC ASSITED PARTIAL NEPHRECTOMY Right 07/28/2019   Procedure: XI ROBOTIC ASSITED PARTIAL NEPHRECTOMY;  Surgeon: Cleon Gustin, MD;  Location: [facility];  Service: Urology;  Laterality: Right;  3 HRS     reports that she has never smoked. She has never used smokeless tobacco. She reports current alcohol use of about 1.0 standard drink of alcohol per week. She reports that she does not use drugs.  Allergies  Allergen Reactions   Amlodipine Swelling   Humalog Kwikpen [Insulin Lispro] Other (See Comments)    75/50---swelling and ulcers in mouth    Amoxicillin     constipation    Family History  Problem Relation Age of Onset   High blood pressure Mother    Diabetes Mother    Arthritis Mother    Hyperlipidemia Mother    High blood  pressure Father    Hyperlipidemia Father    High blood pressure Sister    Diabetes Brother    Hyperlipidemia Brother    High blood pressure Brother     Prior to Admission medications   Medication Sig Start Date End Date Taking? Authorizing Provider  Alcohol Swabs 70 % PADS 1 application by Does not apply route 3 (three) times daily before meals. 06/06/16   Dhungel, Flonnie Overman, MD  cetirizine (ZYRTEC) 10 MG tablet Take 10 mg by mouth daily as needed for allergies.     [provider]  cyanocobalamin 1000 MCG tablet Take 1,000 mcg by mouth daily. 07/25/20   [provider]  ferrous sulfate 325 (65 FE) MG EC tablet Take 1 tablet (325 mg total) by mouth 2 (two) times daily. 12/15/20 04/14/21  Bonnielee Haff, MD  furosemide (LASIX) 40 MG tablet Take 2 tablets twice a day for 5 days, then take 1 tablet twice a day till you see your PCP. Patient taking differently: Take 80 mg by mouth 2 (two) times daily. Take 2 tablets twice a day for 5 days, then take 1 tablet twice a day till you see your PCP. 12/15/20   Bonnielee Haff, MD  gabapentin (NEURONTIN) 300 MG capsule Take 300 mg by mouth daily as needed (pain).    [provider]  glucose blood test strip Use as instructed 06/06/16   Dhungel, Nishant, MD  hydrALAZINE (APRESOLINE) 50 MG tablet Take 1 tablet (50 mg total) by mouth 3 (three) times daily. 12/15/20   Bonnielee Haff, MD  insulin aspart (NOVOLOG FLEXPEN) 100 UNIT/ML FlexPen Inject 12-20 Units as directed 3 (three) times daily. Sliding scale 01/03/19   [provider]  Insulin Glargine (BASAGLAR KWIKPEN) 100 UNIT/ML Inject 50 Units into the skin at bedtime. Patient taking differently: Inject 50 Units into the skin at bedtime. Sliding scale 12/15/20   Bonnielee Haff, MD  Insulin Syringe-Needle U-100 (INSULIN SYRINGE 1CC/28G) 28G X 1/2" 1 ML MISC 1 application by Does not apply route 3 (three) times daily before meals. 06/06/16   Dhungel, Flonnie Overman, MD  metoprolol succinate (TOPROL-XL) 100 MG 24 hr tablet Take 1 tablet (100 mg total) by mouth daily. 12/15/20   Bonnielee Haff, MD  Multiple Vitamin (MULTIVITAMIN WITH MINERALS) TABS tablet Take 1 tablet by mouth daily. Centrum    [provider]  nitroGLYCERIN (NITROSTAT) 0.4 MG SL tablet Place 0.4 mg under the tongue every 5 (five) minutes as needed for chest pain. 04/17/19   [provider]  olmesartan (BENICAR) 40 MG tablet Take 1 tablet (40 mg total) by mouth daily. 01/30/21    Thurnell Lose, MD  potassium chloride SA (KLOR-CON) 20 MEQ tablet Take 20 mEq by mouth daily.    [provider]  rosuvastatin (CRESTOR) 40 MG tablet Take 1 tablet (40 mg total) by mouth daily. 01/29/21   Thurnell Lose, MD  XARELTO 20 MG TABS tablet Take 20 mg by mouth daily. 10/15/20   [provider]    Physical Exam: Vitals:   04/05/21 1637 04/05/21 1900 04/05/21 1917 04/05/21 1945  BP:  (!) 77/67 132/87 125/61  Pulse:  (!) 106 (!) 113 (!) 111  Resp:   16 20  Temp:      TempSrc:      SpO2:  100% 100% 99%  Weight: (!) 154.2 kg     Height: 5' 2"  (1.575 m)       Physical Exam Constitutional:      General: She  is not in acute distress. HENT:     Head: Normocephalic and atraumatic.  Eyes:     Extraocular Movements: Extraocular movements intact.     Conjunctiva/sclera: Conjunctivae normal.  Cardiovascular:     Rate and Rhythm: Normal rate and regular rhythm.     Pulses: Normal pulses.  Pulmonary:     Effort: Pulmonary effort is normal. No respiratory distress.     Breath sounds: Normal breath sounds. No wheezing or rales.  Abdominal:     General: Bowel sounds are normal. There is no distension.     Palpations: Abdomen is soft.     Tenderness: There is no abdominal tenderness.  Musculoskeletal:     Cervical back: Normal range of motion and neck supple.     Comments: Right foot significantly swollen, erythematous, and warm to touch.  Skin:    General: Skin is warm and dry.  Neurological:     General: No focal deficit present.     Mental Status: She is alert and oriented to person, place, and time.     Labs on Admission: I have personally reviewed following labs and imaging studies  CBC: Recent Labs  Lab 04/05/21 1722  WBC 26.2*  NEUTROABS 24.1*  HGB 8.6*  HCT 27.7*  MCV 86.3  PLT 433*   Basic Metabolic Panel: Recent Labs  Lab 04/05/21 1722  NA 134*  K 3.4*  CL 102  CO2 24  GLUCOSE 353*  BUN 19  CREATININE 1.49*  CALCIUM 7.8*    GFR: Estimated Creatinine Clearance: 71.2 mL/min (A) (by C-G formula based on SCr of 1.49 mg/dL (H)). Liver Function Tests: Recent Labs  Lab 04/05/21 1722  AST 16  ALT 18  ALKPHOS 266*  BILITOT 0.4  PROT 5.9*  ALBUMIN <1.0*   No results for input(s): LIPASE, AMYLASE in the last 168 hours. No results for input(s): AMMONIA in the last 168 hours. Coagulation Profile: Recent Labs  Lab 04/05/21 1722  INR 1.0   Cardiac Enzymes: No results for input(s): CKTOTAL, CKMB, CKMBINDEX, TROPONINI in the last 168 hours. BNP (last 3 results) No results for input(s): PROBNP in the last 8760 hours. HbA1C: No results for input(s): HGBA1C in the last 72 hours. CBG: No results for input(s): GLUCAP in the last 168 hours. Lipid Profile: No results for input(s): CHOL, HDL, LDLCALC, TRIG, CHOLHDL, LDLDIRECT in the last 72 hours. Thyroid Function Tests: No results for input(s): TSH, T4TOTAL, FREET4, T3FREE, THYROIDAB in the last 72 hours. Anemia Panel: No results for input(s): VITAMINB12, FOLATE, FERRITIN, TIBC, IRON, RETICCTPCT in the last 72 hours. Urine analysis:    Component Value Date/Time   COLORURINE YELLOW 01/27/2021 0830   APPEARANCEUR HAZY (A) 01/27/2021 0830   LABSPEC 1.014 01/27/2021 0830   PHURINE 6.0 01/27/2021 0830   GLUCOSEU >=500 (A) 01/27/2021 0830   HGBUR NEGATIVE 01/27/2021 0830   BILIRUBINUR NEGATIVE 01/27/2021 0830   KETONESUR NEGATIVE 01/27/2021 0830   PROTEINUR >=300 (A) 01/27/2021 0830   UROBILINOGEN 1.0 08/14/2009 0850   NITRITE NEGATIVE 01/27/2021 0830   LEUKOCYTESUR NEGATIVE 01/27/2021 0830    Radiological Exams on Admission: DG Foot Complete Right  Result Date: 04/05/2021 CLINICAL DATA:  Infection EXAM: RIGHT FOOT COMPLETE - 3+ VIEW COMPARISON:  None. FINDINGS: Vascular calcifications. No fracture or malalignment. No radiopaque foreign body. Diffuse soft tissue swelling. Fairly extensive gas within the soft tissues over the dorsum and plantar aspect of the  foot, centered over the second third and fourth distal digits. IMPRESSION: Edema with fairly extensive  soft tissue emphysema concerning for necrotizing infection. No acute osseous abnormality Electronically Signed   By: Donavan Foil M.D.   On: 04/05/2021 18:01    EKG: Independently reviewed.  Sinus tachycardia.  Assessment/Plan Principal Problem:   Right foot infection Active Problems:   Hypokalemia   Sepsis ([place])   Diabetes ([place])   AKI (acute kidney injury) ([place])   Sepsis secondary to necrotizing soft tissue infection of the right foot Patient presenting with complaints of progressively worsening right foot pain, swelling, and pus drainage after stepping on a piece of glass over a week ago. X-ray of the right foot showing edema with fairly extensive soft tissue emphysema concerning for necrotizing infection; no radiopaque foreign body; no acute osseous abnormality.  Meets criteria for sepsis with tachycardia and significant leukocytosis.  No lactic acidosis or hypotension to suggest severe sepsis. -Continue antibiotic coverage with vancomycin, Zosyn, and clindamycin.  Morphine as needed for pain.  Tachycardia resolved after she was given fluid boluses in the ED.  Blood pressure stable.  Continue maintenance IV fluid.  Does have history of HFpEF but does not appear volume overloaded.  1 set of blood cultures drawn in the ED and pending.  Second set of lactate pending.  Trend WBC count.  Keep n.p.o. after midnight, orthopedics consulted and requesting admission [facility] in the morning.  Poorly controlled insulin-dependent diabetes mellitus Likely due to dietary indiscretions and has not used basal insulin this past week.  Hyperglycemic with blood glucose 353 without signs of DKA.  A1c was 8.9 on 01/26/2021. -Resume home basal insulin after pharmacy med rec is done.  Order sliding scale insulin sensitive.  Consult diabetes coordinator.  AKI Likely prerenal due to sepsis.   Creatinine 1.4, baseline 1.0. -IV fluid hydration.  Monitor renal function and urine output.  Avoid nephrotoxic agents/hold home Lasix and olmesartan.  Mild hypokalemia -Monitor potassium and magnesium levels, replenish as needed.  Proteinuria with hypoalbuminemia Albumin <1.0 and UA done during hospitalization in May 2022 revealed significant proteinuria. Per review of discharge summary, nephrology recommended outpatient follow-up and risk factor management.  Mild intermittent asthma No signs of acute exacerbation.  Not on any inhalers at home. -Albuterol as needed  Chronic diastolic CHF Currently well compensated. -Resume home metoprolol after pharmacy med rec is done.  Hold Lasix at this time given AKI.  Hypertension Currently normotensive. -Resume home metoprolol after pharmacy med rec is done.  Hold any other antihypertensives at this time in the setting of sepsis.  Hyperlipidemia -Resume Crestor after pharmacy med rec is done.  Chronic microcytic anemia Hemoglobin stable at present. -Continue to H&H.  Order type and screen.  Transfuse if hemoglobin less than 7.  History of PE in 2019 -Hold Xarelto at this time as patient will likely need surgery for her necrotizing foot infection.  History of stroke -Hold Xarelto at this time.  Resume statin after pharmacy med rec is done.  DVT prophylaxis: SCDs Code Status: Full code Family Communication: Sister at bedside. Disposition Plan: Status is: Inpatient  Remains inpatient appropriate because:Inpatient level of care appropriate due to severity of illness  Dispo: The patient is from: Home              Anticipated d/c is to: Home              Patient currently is not medically stable to d/c.   Difficult to place patient No  Level of care: Level of care: Telemetry Medical  The  medical decision making on this patient was of high complexity and the patient is at high risk for clinical deterioration, therefore this is a level  3 visit.  Shela Leff MD [facility]  If 7PM-7AM, please contact night-coverage [email or web address]  04/05/2021, 9:30 PM

## 2021-04-05 NOTE — ED Provider Notes (Signed)
Emergency Medicine Provider Triage Evaluation Note  Sherri Dixon , a 40 y.o. female  was evaluated in triage.  Pt complains of foot pain. Had glass in foot 1 week ago that husband removed, now has pain swelling and drainage from the foot. Fevers at home.  Review of Systems  Positive: Foot pain, fever Negative: vomiting  Physical Exam  BP (!) 153/116 (BP Location: Left Arm) Comment: Patient states she has not taken her BP meds today.  Pulse (!) 114   Temp 98.1 F (36.7 C) (Oral)   Resp 18   Ht 5\' 2"  (1.575 m)   Wt (!) 154.2 kg   LMP 04/05/2021 (Exact Date)   SpO2 99%   BMI 62.19 kg/m  Gen:   Awake, no distress   Resp:  Normal effort  MSK:   Moves extremities without difficulty  Other:  Multiple open wounds to the plantar surface of the foot with discoloration to the toes  Medical Decision Making  Medically screening exam initiated at 4:39 PM.  Appropriate orders placed.  Jannatul Wojdyla was informed that the remainder of the evaluation will be completed by another provider, this initial triage assessment does not replace that evaluation, and the importance of remaining in the ED until their evaluation is complete.     Rodney Booze, PA-C 04/05/21 1639    Milton Ferguson, MD 04/07/21 [phone]

## 2021-04-06 ENCOUNTER — Encounter (HOSPITAL_COMMUNITY)
Admission: EM | Disposition: A | Discharge status: Home / Self Care | Payer: Self-pay | Source: Home / Self Care | Attending: Internal Medicine

## 2021-04-06 ENCOUNTER — Inpatient Hospital Stay (HOSPITAL_COMMUNITY): Payer: 59 | Admitting: Certified Registered Nurse Anesthetist

## 2021-04-06 ENCOUNTER — Encounter (HOSPITAL_COMMUNITY): Payer: Self-pay | Admitting: Internal Medicine

## 2021-04-06 DIAGNOSIS — E1165 Type 2 diabetes mellitus with hyperglycemia: Secondary | ICD-10-CM | POA: Diagnosis not present

## 2021-04-06 DIAGNOSIS — Z794 Long term (current) use of insulin: Secondary | ICD-10-CM | POA: Diagnosis not present

## 2021-04-06 DIAGNOSIS — L089 Local infection of the skin and subcutaneous tissue, unspecified: Secondary | ICD-10-CM | POA: Diagnosis not present

## 2021-04-06 DIAGNOSIS — N179 Acute kidney failure, unspecified: Secondary | ICD-10-CM

## 2021-04-06 HISTORY — PX: I & D EXTREMITY: SHX5045

## 2021-04-06 LAB — CBC
HCT: 21.4 % — ABNORMAL LOW (ref 36.0–46.0)
Hemoglobin: 7 g/dL — ABNORMAL LOW (ref 12.0–15.0)
MCH: 27.6 pg (ref 26.0–34.0)
MCHC: 32.7 g/dL (ref 30.0–36.0)
MCV: 84.3 fL (ref 80.0–100.0)
Platelets: 385 10*3/uL (ref 150–400)
RBC: 2.54 MIL/uL — ABNORMAL LOW (ref 3.87–5.11)
RDW: 15 % (ref 11.5–15.5)
WBC: 22.8 10*3/uL — ABNORMAL HIGH (ref 4.0–10.5)
nRBC: 0 % (ref 0.0–0.2)

## 2021-04-06 LAB — BASIC METABOLIC PANEL
Anion gap: 6 (ref 5–15)
BUN: 19 mg/dL (ref 6–20)
CO2: 25 mmol/L (ref 22–32)
Calcium: 7.5 mg/dL — ABNORMAL LOW (ref 8.9–10.3)
Chloride: 104 mmol/L (ref 98–111)
Creatinine, Ser: 1.65 mg/dL — ABNORMAL HIGH (ref 0.44–1.00)
GFR, Estimated: 40 mL/min — ABNORMAL LOW (ref >60)
Glucose, Bld: 144 mg/dL — ABNORMAL HIGH (ref 70–99)
Potassium: 3.3 mmol/L — ABNORMAL LOW (ref 3.5–5.1)
Sodium: 135 mmol/L (ref 135–145)

## 2021-04-06 LAB — GLUCOSE, CAPILLARY
Glucose-Capillary: 153 mg/dL — ABNORMAL HIGH (ref 70–99)
Glucose-Capillary: 126 mg/dL — ABNORMAL HIGH (ref 70–99)
Glucose-Capillary: 82 mg/dL (ref 70–99)
Glucose-Capillary: 139 mg/dL — ABNORMAL HIGH (ref 70–99)
Glucose-Capillary: 180 mg/dL — ABNORMAL HIGH (ref 70–99)
Glucose-Capillary: 249 mg/dL — ABNORMAL HIGH (ref 70–99)
Glucose-Capillary: 242 mg/dL — ABNORMAL HIGH (ref 70–99)

## 2021-04-06 LAB — TYPE AND SCREEN
ABO/RH(D): A POS
Antibody Screen: NEGATIVE

## 2021-04-06 LAB — SURGICAL PCR SCREEN
MRSA, PCR: NEGATIVE
Staphylococcus aureus: POSITIVE — AB

## 2021-04-06 LAB — MAGNESIUM: Magnesium: 1.8 mg/dL (ref 1.7–2.4)

## 2021-04-06 SURGERY — AMPUTATION, FOOT, TRANSMETATARSAL
Anesthesia: Choice | Site: Toe | Laterality: Right

## 2021-04-06 SURGERY — IRRIGATION AND DEBRIDEMENT EXTREMITY
Anesthesia: General | Laterality: Right

## 2021-04-06 MED ORDER — CHLORHEXIDINE GLUCONATE 0.12 % MT SOLN: 15.0000 mL | Freq: Once | OROMUCOSAL | Status: AC

## 2021-04-06 MED ORDER — SODIUM CHLORIDE 0.9 % IR SOLN
Status: DC | PRN
  Administered 2021-04-06: 3000 mL
  Administered 2021-04-06: 1000 mL

## 2021-04-06 MED ORDER — HYDROMORPHONE HCL 1 MG/ML IJ SOLN
0.2500 mg | INTRAMUSCULAR | Status: DC | PRN
  Administered 2021-04-06: 0.25 mg via INTRAVENOUS
  Administered 2021-04-06: 0.5 mg via INTRAVENOUS
  Administered 2021-04-06: 0.25 mg via INTRAVENOUS

## 2021-04-06 MED ORDER — INSULIN ASPART 100 UNIT/ML IJ SOLN
0.0000 [IU] | Freq: Three times a day (TID) | INTRAMUSCULAR | Status: DC
  Administered 2021-04-07 (×2): 3 [IU] via SUBCUTANEOUS
  Administered 2021-04-07: 2 [IU] via SUBCUTANEOUS
  Administered 2021-04-09 – 2021-04-12 (×2): 1 [IU] via SUBCUTANEOUS
  Administered 2021-04-13: 2 [IU] via SUBCUTANEOUS

## 2021-04-06 MED ORDER — MIDAZOLAM HCL 2 MG/2ML IJ SOLN
INTRAMUSCULAR | Status: DC | PRN
  Administered 2021-04-06: 2 mg via INTRAVENOUS

## 2021-04-06 MED ORDER — OXYCODONE HCL 5 MG PO TABS
5.0000 mg | ORAL_TABLET | ORAL | Status: DC | PRN
  Administered 2021-04-08 – 2021-04-09 (×4): 5 mg via ORAL
  Filled 2021-04-06 (×5): qty 1

## 2021-04-06 MED ORDER — LACTATED RINGERS IV SOLN: INTRAVENOUS | Status: DC

## 2021-04-06 MED ORDER — VANCOMYCIN HCL 1250 MG/250ML IV SOLN
1250.0000 mg | INTRAVENOUS | Status: DC
  Administered 2021-04-06: 1250 mg via INTRAVENOUS
  Filled 2021-04-06: qty 250

## 2021-04-06 MED ORDER — FENTANYL CITRATE (PF) 250 MCG/5ML IJ SOLN
INTRAMUSCULAR | Status: DC | PRN
  Administered 2021-04-06 (×2): 25 ug via INTRAVENOUS
  Administered 2021-04-06: 50 ug via INTRAVENOUS

## 2021-04-06 MED ORDER — 0.9 % SODIUM CHLORIDE (POUR BTL) OPTIME
TOPICAL | Status: DC | PRN
  Administered 2021-04-06: 1000 mL

## 2021-04-06 MED ORDER — ORAL CARE MOUTH RINSE: 15.0000 mL | Freq: Once | OROMUCOSAL | Status: DC

## 2021-04-06 MED ORDER — POTASSIUM CHLORIDE CRYS ER 20 MEQ PO TBCR
40.0000 meq | EXTENDED_RELEASE_TABLET | Freq: Once | ORAL | Status: AC
  Administered 2021-04-06: 40 meq via ORAL
  Filled 2021-04-06: qty 2

## 2021-04-06 MED ORDER — MIDAZOLAM HCL 2 MG/2ML IJ SOLN
INTRAMUSCULAR | Status: AC
  Filled 2021-04-06: qty 2

## 2021-04-06 MED ORDER — LIDOCAINE 2% (20 MG/ML) 5 ML SYRINGE
INTRAMUSCULAR | Status: DC | PRN
  Administered 2021-04-06: 60 mg via INTRAVENOUS

## 2021-04-06 MED ORDER — VANCOMYCIN HCL 1000 MG IV SOLR
INTRAVENOUS | Status: AC
  Filled 2021-04-06: qty 1000

## 2021-04-06 MED ORDER — ORAL CARE MOUTH RINSE: 15.0000 mL | Freq: Once | OROMUCOSAL | Status: AC

## 2021-04-06 MED ORDER — INSULIN GLARGINE 100 UNIT/ML ~~LOC~~ SOLN
10.0000 [IU] | Freq: Every day | SUBCUTANEOUS | Status: DC
  Administered 2021-04-06: 10 [IU] via SUBCUTANEOUS
  Filled 2021-04-06 (×2): qty 0.1

## 2021-04-06 MED ORDER — HYDROMORPHONE HCL 1 MG/ML IJ SOLN
INTRAMUSCULAR | Status: AC
  Filled 2021-04-06: qty 1

## 2021-04-06 MED ORDER — DEXAMETHASONE SODIUM PHOSPHATE 10 MG/ML IJ SOLN
INTRAMUSCULAR | Status: DC | PRN
  Administered 2021-04-06: 5 mg via INTRAVENOUS

## 2021-04-06 MED ORDER — FENTANYL CITRATE (PF) 250 MCG/5ML IJ SOLN
INTRAMUSCULAR | Status: AC
  Filled 2021-04-06: qty 5

## 2021-04-06 MED ORDER — PROPOFOL 10 MG/ML IV BOLUS
INTRAVENOUS | Status: AC
  Filled 2021-04-06: qty 20

## 2021-04-06 MED ORDER — VANCOMYCIN HCL 1000 MG IV SOLR
INTRAVENOUS | Status: DC | PRN
  Administered 2021-04-06: 1000 mg

## 2021-04-06 MED ORDER — PHENYLEPHRINE HCL (PRESSORS) 10 MG/ML IV SOLN
INTRAVENOUS | Status: DC | PRN
  Administered 2021-04-06: 40 ug via INTRAVENOUS

## 2021-04-06 MED ORDER — PROPOFOL 10 MG/ML IV BOLUS
INTRAVENOUS | Status: DC | PRN
  Administered 2021-04-06: 150 mg via INTRAVENOUS

## 2021-04-06 MED ORDER — PHENYLEPHRINE HCL-NACL 10-0.9 MG/250ML-% IV SOLN
INTRAVENOUS | Status: DC | PRN
  Administered 2021-04-06: 15 ug/min via INTRAVENOUS

## 2021-04-06 MED ORDER — ONDANSETRON HCL 4 MG/2ML IJ SOLN
INTRAMUSCULAR | Status: DC | PRN
  Administered 2021-04-06: 4 mg via INTRAVENOUS

## 2021-04-06 MED ORDER — CHLORHEXIDINE GLUCONATE 0.12 % MT SOLN: 15.0000 mL | Freq: Once | OROMUCOSAL | Status: DC

## 2021-04-06 MED ORDER — CHLORHEXIDINE GLUCONATE 0.12 % MT SOLN
OROMUCOSAL | Status: AC
  Administered 2021-04-06: 15 mL via OROMUCOSAL
  Filled 2021-04-06: qty 15

## 2021-04-06 SURGICAL SUPPLY — 50 items
BAG COUNTER SPONGE SURGICOUNT (BAG) IMPLANT
BAG SPNG CNTER NS LX DISP (BAG)
BANDAGE ESMARK 6X9 LF (GAUZE/BANDAGES/DRESSINGS) IMPLANT
BNDG CMPR 9X4 STRL LF SNTH (GAUZE/BANDAGES/DRESSINGS)
BNDG CMPR 9X6 STRL LF SNTH (GAUZE/BANDAGES/DRESSINGS)
BNDG CMPR MED 10X6 ELC LF (GAUZE/BANDAGES/DRESSINGS) ×1
BNDG COHESIVE 4X5 TAN STRL (GAUZE/BANDAGES/DRESSINGS) IMPLANT
BNDG ELASTIC 6X10 VLCR STRL LF (GAUZE/BANDAGES/DRESSINGS) ×2 IMPLANT
BNDG ESMARK 4X9 LF (GAUZE/BANDAGES/DRESSINGS) IMPLANT
BNDG ESMARK 6X9 LF (GAUZE/BANDAGES/DRESSINGS)
BNDG GAUZE ELAST 4 BULKY (GAUZE/BANDAGES/DRESSINGS) ×2 IMPLANT
CANISTER WOUNDNEG PRESSURE 500 (CANNISTER) ×2 IMPLANT
COVER SURGICAL LIGHT HANDLE (MISCELLANEOUS) ×4 IMPLANT
CUFF TOURN SGL QUICK 34 (TOURNIQUET CUFF) ×2
CUFF TRNQT CYL 34X4.125X (TOURNIQUET CUFF) ×1 IMPLANT
DRAPE U-SHAPE 47X51 STRL (DRAPES) ×2 IMPLANT
DRSG ADAPTIC 3X8 NADH LF (GAUZE/BANDAGES/DRESSINGS) ×2 IMPLANT
DRSG PAD ABDOMINAL 8X10 ST (GAUZE/BANDAGES/DRESSINGS) IMPLANT
DRSG VAC ATS MED SENSATRAC (GAUZE/BANDAGES/DRESSINGS) ×2 IMPLANT
DRSG XEROFORM 1X8 (GAUZE/BANDAGES/DRESSINGS) IMPLANT
DURAPREP 26ML APPLICATOR (WOUND CARE) ×2 IMPLANT
ELECT REM PT RETURN 9FT ADLT (ELECTROSURGICAL) ×2
ELECTRODE REM PT RTRN 9FT ADLT (ELECTROSURGICAL) ×1 IMPLANT
GAUZE SPONGE 4X4 12PLY STRL (GAUZE/BANDAGES/DRESSINGS) IMPLANT
GAUZE SPONGE 4X4 12PLY STRL LF (GAUZE/BANDAGES/DRESSINGS) ×2 IMPLANT
GAUZE XEROFORM 1X8 LF (GAUZE/BANDAGES/DRESSINGS) ×2 IMPLANT
GLOVE SURG ENC TEXT LTX SZ7.5 (GLOVE) ×2 IMPLANT
GLOVE SURG UNDER LTX SZ8 (GLOVE) ×2 IMPLANT
GOWN STRL REUS W/ TWL LRG LVL3 (GOWN DISPOSABLE) ×1 IMPLANT
GOWN STRL REUS W/ TWL XL LVL3 (GOWN DISPOSABLE) ×1 IMPLANT
GOWN STRL REUS W/TWL LRG LVL3 (GOWN DISPOSABLE) ×2
GOWN STRL REUS W/TWL XL LVL3 (GOWN DISPOSABLE) ×2
HANDPIECE INTERPULSE COAX TIP (DISPOSABLE) ×2
KIT BASIN OR (CUSTOM PROCEDURE TRAY) ×2 IMPLANT
KIT TURNOVER KIT B (KITS) ×2 IMPLANT
MANIFOLD NEPTUNE II (INSTRUMENTS) ×2 IMPLANT
NEEDLE 22X1 1/2 (OR ONLY) (NEEDLE) IMPLANT
NS IRRIG 1000ML POUR BTL (IV SOLUTION) ×2 IMPLANT
PACK ORTHO EXTREMITY (CUSTOM PROCEDURE TRAY) ×2 IMPLANT
PAD ARMBOARD 7.5X6 YLW CONV (MISCELLANEOUS) ×4 IMPLANT
PAD CAST 4YDX4 CTTN HI CHSV (CAST SUPPLIES) ×1 IMPLANT
PAD NEG PRESSURE SENSATRAC (MISCELLANEOUS) ×2 IMPLANT
PADDING CAST COTTON 4X4 STRL (CAST SUPPLIES) ×2
SET HNDPC FAN SPRY TIP SCT (DISPOSABLE) ×1 IMPLANT
SPONGE T-LAP 18X18 ~~LOC~~+RFID (SPONGE) IMPLANT
STOCKINETTE IMPERVIOUS 9X36 MD (GAUZE/BANDAGES/DRESSINGS) IMPLANT
SUT ETHILON 2 0 FS 18 (SUTURE) IMPLANT
TUBE CONNECTING 12X1/4 (SUCTIONS) ×2 IMPLANT
UNDERPAD 30X36 HEAVY ABSORB (UNDERPADS AND DIAPERS) ×2 IMPLANT
YANKAUER SUCT BULB TIP NO VENT (SUCTIONS) ×2 IMPLANT

## 2021-04-06 NOTE — Anesthesia Procedure Notes (Signed)
Procedure Name: LMA Insertion Date/Time: 04/06/2021 11:27 AM Performed by: Clearnce Sorrel, CRNA Pre-anesthesia Checklist: Patient identified, Emergency Drugs available, Suction available and Patient being monitored Patient Re-evaluated:Patient Re-evaluated prior to induction Oxygen Delivery Method: Circle System Utilized Preoxygenation: Pre-oxygenation with 100% oxygen Induction Type: IV induction Ventilation: Mask ventilation without difficulty LMA: LMA inserted LMA Size: 4.0 Number of attempts: 1 Placement Confirmation: positive ETCO2 and breath sounds checked- equal and bilateral Tube secured with: Tape Dental Injury: Teeth and Oropharynx as per pre-operative assessment

## 2021-04-06 NOTE — Transfer of Care (Signed)
Immediate Anesthesia Transfer of Care Note  Patient: Sherri Dixon  Procedure(s) Performed: IRRIGATION AND DEBRIDEMENT TOE WITH WOUND VAC PLACEMENT (Right)  Patient Location: PACU  Anesthesia Type:General  Level of Consciousness: awake, alert  and oriented  Airway & Oxygen Therapy: Patient Spontanous Breathing  Post-op Assessment: Report given to RN and Post -op Vital signs reviewed and stable  Post vital signs: Reviewed and stable  Last Vitals:  Vitals Value Taken Time  BP 121/59 04/06/21 1228  Temp 36.2 C 04/06/21 1228  Pulse 80 04/06/21 1230  Resp 14 04/06/21 1230  SpO2 100 % 04/06/21 1230  Vitals shown include unvalidated device data.  Last Pain:  Vitals:   04/06/21 1228  TempSrc:   PainSc: 7       Patients Stated Pain Goal: 3 (69/67/89 3810)  Complications: No notable events documented.

## 2021-04-06 NOTE — Progress Notes (Signed)
NT from 6N took the pt's ring back to the floor from Short Stay.

## 2021-04-06 NOTE — Anesthesia Preprocedure Evaluation (Addendum)
Anesthesia Evaluation  Patient identified by MRN, date of birth, ID band Patient awake    Reviewed: Allergy & Precautions, H&P , NPO status , Patient's Chart, lab work & pertinent test results, reviewed documented beta blocker date and time   Airway Mallampati: III  TM Distance: >3 FB Neck ROM: Full    Dental no notable dental hx. (+) Teeth Intact, Dental Advisory Given   Pulmonary asthma , PE   Pulmonary exam normal breath sounds clear to auscultation       Cardiovascular hypertension, Pt. on medications and Pt. on home beta blockers  Rhythm:Regular Rate:Normal     Neuro/Psych CVA, Residual Symptoms negative psych ROS   GI/Hepatic Neg liver ROS, GERD  ,  Endo/Other  diabetes, Insulin DependentMorbid obesity  Renal/GU Renal InsufficiencyRenal disease  negative genitourinary   Musculoskeletal   Abdominal   Peds  Hematology  (+) Blood dyscrasia, anemia ,   Anesthesia Other Findings   Reproductive/Obstetrics negative OB ROS                            Anesthesia Physical Anesthesia Plan  ASA: 3  Anesthesia Plan: General   Post-op Pain Management:    Induction: Intravenous  PONV Risk Score and Plan: 4 or greater and Ondansetron, Dexamethasone and Midazolam  Airway Management Planned: LMA  Additional Equipment:   Intra-op Plan:   Post-operative Plan: Extubation in OR  Informed Consent: I have reviewed the patients History and Physical, chart, labs and discussed the procedure including the risks, benefits and alternatives for the proposed anesthesia with the patient or authorized representative who has indicated his/her understanding and acceptance.     Dental advisory given  Plan Discussed with: CRNA  Anesthesia Plan Comments:         Anesthesia Quick Evaluation

## 2021-04-06 NOTE — Progress Notes (Signed)
PROGRESS NOTE    Sherri Dixon  DUK:[id number] DOB: [birth year] DOA: 04/05/2021 PCP: Tomasa Hose, NP   Brief Narrative: Sherri Dixon is a 40 y.o. female with a history of asthma, chronic diastolic heart failure, hypertension, hyperlipidemia, diabetes mellitus on insulin, chronic microcytic anemia, GERD, history of PE on Xarelto, morbid obesity.  Patient presented secondary to right foot pain and swelling after stepping on a piece of glass about 1 week ago.  She is now found to have an infected foot concerning for necrotizing infection.  Orthopedic surgery was consulted and have taken her for irrigation and debridement with wound VAC placement.  Patient is placed on empiric antibiotics.   Assessment & Plan:   Principal Problem:   Right foot infection Active Problems:   Hypokalemia   Sepsis ([place])   Diabetes ([place])   AKI (acute kidney injury) ([place])   Sepsis Present on admission. Secondary to necrotizing foot infection. Patient started empirically on Vancomycin/Zosyn and clindamycin. Blood cultures obtained. WBC still very elevated -Follow-up blood cultures -Antibiotics as mentioned above -Trend CBC  Right foot necrotizing infection Orthopedic surgery consulted for debridement which was performed on 7/9. Significant necrotic tissue mentioned in op note. Wound vac placed. Recommendation to continue antibiotics -Continue Vancomycin/Zosyn/Clindamycin  AKI In setting of sepsis and decreased oral intake in addition to ARB use. Patient has a history of grade 2 diastolic heart dysfunction. -Continue IV fluids for now -BMP in AM  Urinary retention Upon discussion with patient, this appears to be secondary to positioning. Attempt in/out now. If recurrent will workup for causes.  Hypokalemia -Kdur  Acute anemia In setting of Xarelto use and heavy periods. Currently on her menstrual cycle. -Trend CBC; transfuse for hemoglobin <7  Chronic diastolic dysfunction Holding lasix for  now. -Watch fluid status  Hyperlipidemia -Continue Crestor  Diabetes mellitus, type 2 Last hemoglobin A1c of 8.9% on January 26, 2021.  Patient is on Lantus 45-50 units at bedside in addition to Novolog 12-20 units TID with meals as an outpatient. Started on SSI on admission.  Blood sugar is better controlled than expected -Start Lantus 10 units nightly based off of aspirin given yesterday -Continue NovoLog sliding scale insulin  History of PE On Xarelto which has been held secondary to need for surgery  Primary hypertension Patient is on hydralazine and olmesartan as an outpatient. Blood pressure is labile. Antihypertensives held on admission -Continue to hold hydralazine/olmesartan   DVT prophylaxis: SCDs Start: 04/05/21 2049 Code Status:   Code Status: Full Code Family Communication: None at bedside Disposition Plan: Discharge home likely in several days pending orthopedic surgery recommendations/transition to oral antibiotics   Consultants:  Orthopedic surgery  Procedures:  IRRIGATION AND DEBRIDEMENT WITH WOUND [place] (04/06/2021)  Antimicrobials: Vancomycin Zosyn Clindamycin    Subjective: No concern at this time. Feels like she can't urinate because of her positioning. She states she generally has heavy menstrual cycles because of her Xarelto.  Objective: Vitals:   04/06/21 1228 04/06/21 1243 04/06/21 1258 04/06/21 1314  BP: (!) 121/59 120/62 115/61 110/64  Pulse: 80 80 77 81  Resp: 16 16 15 16   Temp: (!) 97.2 F (36.2 C)  (!) 97.5 F (36.4 C) 98 F (36.7 C)  TempSrc:    Oral  SpO2: 100% 99% 98% 100%  Weight:      Height:        Intake/Output Summary (Last 24 hours) at 04/06/2021 1438 Last data filed at 04/06/2021 1335 Gross per 24 hour  Intake 1168.81 ml  Output 10 ml  Net 1158.81 ml   Filed Weights   04/05/21 1637 04/06/21 1108  Weight: (!) 154.2 kg (!) 154.2 kg    Examination:  General exam: Appears calm and comfortable Respiratory  system: Clear to auscultation. Respiratory effort normal. Cardiovascular system: S1 & S2 heard, RRR. No murmurs, rubs, gallops or clicks. Gastrointestinal system: Abdomen is nondistended, soft and nontender. No organomegaly or masses felt. Normal bowel sounds heard. Central nervous system: Alert and oriented. No focal neurological deficits. Musculoskeletal: No edema. No calf tenderness Skin: No cyanosis. Dorsum of right foot with necrotic appearing skin, no significant erythema Psychiatry: Judgement and insight appear normal. Mood & affect appropriate.     Data Reviewed: I have personally reviewed following labs and imaging studies  CBC Lab Results  Component Value Date   WBC 22.8 (H) 04/06/2021   RBC 2.54 (L) 04/06/2021   HGB 7.0 (L) 04/06/2021   HCT 21.4 (L) 04/06/2021   MCV 84.3 04/06/2021   MCH 27.6 04/06/2021   PLT 385 04/06/2021   MCHC 32.7 04/06/2021   RDW 15.0 04/06/2021   LYMPHSABS 1.6 04/05/2021   MONOABS 0.5 04/05/2021   EOSABS 0.0 04/05/2021   BASOSABS 0.0 24/23/5361     Last metabolic panel Lab Results  Component Value Date   NA 135 04/06/2021   K 3.3 (L) 04/06/2021   CL 104 04/06/2021   CO2 25 04/06/2021   BUN 19 04/06/2021   CREATININE 1.65 (H) 04/06/2021   GLUCOSE 144 (H) 04/06/2021   GFRNONAA 40 (L) 04/06/2021   GFRAA 57 (L) 07/30/2019   CALCIUM 7.5 (L) 04/06/2021   PROT 5.9 (L) 04/05/2021   ALBUMIN <1.0 (L) 04/05/2021   BILITOT 0.4 04/05/2021   ALKPHOS 266 (H) 04/05/2021   AST 16 04/05/2021   ALT 18 04/05/2021   ANIONGAP 6 04/06/2021    CBG (last 3)  Recent Labs    04/06/21 0359 04/06/21 0749 04/06/21 1229  GLUCAP 153* 126* 82     GFR: Estimated Creatinine Clearance: 64.3 mL/min (A) (by C-G formula based on SCr of 1.65 mg/dL (H)).  Coagulation Profile: Recent Labs  Lab 04/05/21 1722  INR 1.0    Recent Results (from the past 240 hour(s))  Resp Panel by RT-PCR (Flu A&B, Covid) Nasopharyngeal Swab     Status: None   Collection  Time: 04/05/21  4:40 PM   Specimen: Nasopharyngeal Swab; Nasopharyngeal(NP) swabs in vial transport medium  Result Value Ref Range Status   SARS Coronavirus 2 by RT PCR NEGATIVE NEGATIVE Final    Comment: (NOTE) SARS-CoV-2 target nucleic acids are NOT DETECTED.  The SARS-CoV-2 RNA is generally detectable in upper respiratory specimens during the acute phase of infection. The lowest concentration of SARS-CoV-2 viral copies this assay can detect is 138 copies/mL. A negative result does not preclude SARS-Cov-2 infection and should not be used as the sole basis for treatment or other patient management decisions. A negative result may occur with  improper specimen collection/handling, submission of specimen other than nasopharyngeal swab, presence of viral mutation(s) within the areas targeted by this assay, and inadequate number of viral copies(<138 copies/mL). A negative result must be combined with clinical observations, patient history, and epidemiological information. The expected result is Negative.  Fact Sheet for Patients:  EntrepreneurPulse.com.au  Fact Sheet for Healthcare Providers:  IncredibleEmployment.be  This test is no t yet approved or cleared by the [place] FDA and  has been authorized for detection and/or diagnosis of SARS-CoV-2 by FDA under an Emergency  Use Authorization (EUA). This EUA will remain  in effect (meaning this test can be used) for the duration of the COVID-19 declaration under Section 564(b)(1) of the Act, 21 U.S.C.section 360bbb-3(b)(1), unless the authorization is terminated  or revoked sooner.       Influenza A by PCR NEGATIVE NEGATIVE Final   Influenza B by PCR NEGATIVE NEGATIVE Final    Comment: (NOTE) The Xpert Xpress SARS-CoV-2/FLU/RSV plus assay is intended as an aid in the diagnosis of influenza from Nasopharyngeal swab specimens and should not be used as a sole basis for treatment. Nasal washings  and aspirates are unacceptable for Xpert Xpress SARS-CoV-2/FLU/RSV testing.  Fact Sheet for Patients: EntrepreneurPulse.com.au  Fact Sheet for Healthcare Providers: IncredibleEmployment.be  This test is not yet approved or cleared by the [place] FDA and has been authorized for detection and/or diagnosis of SARS-CoV-2 by FDA under an Emergency Use Authorization (EUA). This EUA will remain in effect (meaning this test can be used) for the duration of the COVID-19 declaration under Section 564(b)(1) of the Act, 21 U.S.C. section 360bbb-3(b)(1), unless the authorization is terminated or revoked.  Performed at [facility], [place] [address], [place], [place] [address]   Blood Culture (routine x 2)     Status: None (Preliminary result)   Collection Time: 04/05/21  5:23 PM   Specimen: BLOOD  Result Value Ref Range Status   Specimen Description   Final    BLOOD BLOOD RIGHT ARM Performed at [facility] [address] [place], [place] [address]    Special Requests   Final    BOTTLES DRAWN AEROBIC ONLY Blood Culture adequate volume Performed at [facility] [address], [place], [place] [address]    Culture   Final    NO GROWTH < 12 HOURS Performed at [facility] [address], [place], [place] [address]    Report Status PENDING  Incomplete  Blood Culture (routine x 2)     Status: None (Preliminary result)   Collection Time: 04/05/21  7:30 PM   Specimen: BLOOD  Result Value Ref Range Status   Specimen Description   Final    BLOOD RIGHT ANTECUBITAL Performed at [facility] [address], [place], [place] [address]    Special Requests   Final    BOTTLES DRAWN AEROBIC AND ANAEROBIC Blood Culture adequate volume Performed at [facility] [address] [place], [place] [address]    Culture   Final    NO GROWTH < 12  HOURS Performed at [facility] [address] [place], [place] [address]    Report Status PENDING  Incomplete  Surgical pcr screen     Status: Abnormal   Collection Time: 04/06/21  8:55 AM   Specimen: Nasal Mucosa; Nasal Swab  Result Value Ref Range Status   MRSA, PCR NEGATIVE NEGATIVE Final   Staphylococcus aureus POSITIVE (A) NEGATIVE Final    Comment: (NOTE) The Xpert SA Assay (FDA approved for NASAL specimens in patients 42 years of age and older), is one component of a comprehensive surveillance program. It is not intended to diagnose infection nor to guide or monitor treatment. Performed at [facility], [place] [address] [place], [place] [address]         Radiology Studies: DG Foot Complete Right  Result Date: 04/05/2021 CLINICAL DATA:  Infection EXAM: RIGHT FOOT COMPLETE - 3+ VIEW COMPARISON:  None. FINDINGS: Vascular calcifications. No  fracture or malalignment. No radiopaque foreign body. Diffuse soft tissue swelling. Fairly extensive gas within the soft tissues over the dorsum and plantar aspect of the foot, centered over the second third and fourth distal digits. IMPRESSION: Edema with fairly extensive soft tissue emphysema concerning for necrotizing infection. No acute osseous abnormality Electronically Signed   By: Donavan Foil M.D.   On: 04/05/2021 18:01        Scheduled Meds:  HYDROmorphone       insulin aspart  0-9 Units Subcutaneous Q4H   Continuous Infusions:  clindamycin (CLEOCIN) IV 600 mg (04/06/21 1338)   lactated ringers 75 mL/hr at 04/06/21 0530   piperacillin-tazobactam (ZOSYN)  IV 3.375 g (04/06/21 1336)   vancomycin       LOS: 1 day     Cordelia Poche, MD [facility] 04/06/2021, 2:38 PM  If 7PM-7AM, please contact night-coverage [email or web address]

## 2021-04-06 NOTE — Anesthesia Postprocedure Evaluation (Signed)
Anesthesia Post Note  Patient: Sherri Dixon  Procedure(s) Performed: IRRIGATION AND DEBRIDEMENT TOE WITH WOUND VAC PLACEMENT (Right)     Patient location during evaluation: PACU Anesthesia Type: General Level of consciousness: awake and alert Pain management: pain level controlled Vital Signs Assessment: post-procedure vital signs reviewed and stable Respiratory status: spontaneous breathing, nonlabored ventilation and respiratory function stable Cardiovascular status: blood pressure returned to baseline and stable Postop Assessment: no apparent nausea or vomiting Anesthetic complications: no   No notable events documented.  Last Vitals:  Vitals:   04/06/21 1258 04/06/21 1314  BP: 115/61 110/64  Pulse: 77 81  Resp: 15 16  Temp: (!) 36.4 C 36.7 C  SpO2: 98% 100%    Last Pain:  Vitals:   04/06/21 1314  TempSrc: Oral  PainSc:                  Selwyn Reason,W. EDMOND

## 2021-04-06 NOTE — Op Note (Signed)
Debany Vantol female 40 y.o. 04/06/2021  PreOperative Diagnosis: Right foot necrotizing abscess  PostOperative Diagnosis: Right foot necrotizing abscess   PROCEDURE: Incision and drainage right foot infection multiple bursal cavities Excisional debridement of right foot necrotizing infection Application of wound VAC  SURGEON: Melony Overly, MD  ASSISTANT: Jesse Martinique, PA-S  ANESTHESIA: General LMA anesthesia  FINDINGS: Deep necrotizing soft tissue infection involving the forefoot and plantar as well as dorsal foot structures with exposed underlying bone.  The abscess tracked proximal to the level of the origin of the plantar fascia proximally and within the dorsal soft tissues to the level of the tarsometatarsal joints  IMPLANTS: Wound VAC  INDICATIONS:40 y.o. female with a history of uncontrolled diabetes presented to the emergency department with worsening foot pain and swelling.  She states she stepped on glass approximately 1 week ago and had worsening pain.  She was found in the ER to have soft tissue swelling, fluctuance and evidence of gas within the soft tissues on x-ray.  Given her findings she was indicated for surgical debridement versus amputation.  Patient was really hoping to avoid amputation and therefore agreed to exploration and debridement and wound VAC placement to document the extent of the infection.  Given that she states this began only 1 week ago I found this to be reasonable but I did stress that if the findings were significant and there was exposed bone and concern for underlying necrotizing infection that she would likely need further surgery in the form of amputation.  The level of amputation to be determined based on extent of soft tissue damage.   Patient understood the risks, benefits and alternatives to surgery which include but are not limited to wound healing complications, infection, need for further surgery and possible transmetatarsal versus  below the knee amputation as well as damage to surrounding structures. They also understood the potential for continued pain in that there were no guarantees of acceptable outcome After weighing these risks the patient opted to proceed with surgery.  PROCEDURE: Patient was identified in the preoperative holding area.  The right foot was marked by myself.  Consent was signed by myself and the patient.  She was taken to the operative suite and placed supine on the operative table.  Anesthesia was induced without difficulty.  She had received antibiotics previously due to her scheduled antibiotics and no further antibiotics were given.  The right lower extremity was prepped and draped in the usual sterile fashion.  Bone foam was used.  Surgical timeout was performed.  We began by inspecting the foot.  There was a large fluctuant area on the plantar forefoot.  There is duskiness to the middle toe.  An incision was made overlying the area of fluctuance on the plantar forefoot.  There is a large rush of purulent fluid with gas and significantly foul odor.  The incision was carried proximally.  The dead skin was removed with a 15 blade.  Then the abscess cavity was followed and there are multiple bursal spaces and exposed tendon as well as exposed bone within the abscess cavity.  The abscess cavity was deloculated dorsally and the extent of the cavity proximally was to the level of the tarsometatarsal joints.  It was necessary to create a separate incision on the plantar foot near the level of the plantar midfoot.  There is areas of fluctuance here and we are unable to identify the tract from the dorsal foot to this area.  An incision was made along  the plantar skin.  A rush of purulent fluid was obtained.  Then this was deloculated.  Plantarly it tracked to the base of her heel and calcaneal tuberosity all the way to the forefoot and the base of the metatarsals and toes.  Irrigation was performed of these cavities  and all of the cavities were deloculated.  The purulent material was discarded and some of the devitalized tissues within the dorsal dorsal and plantar cavities were sent for microbiology.  We then proceeded with excisional debridement of these cavities.  Debridement type: Excisional Debridement  Side: right  Body Location: Foot  Tools used for debridement: scalpel, scissors, and rongeur  Pre-debridement Wound size (cm):   Length: 2 cm        width: 0 cm     depth: 1 cm  Post-debridement Wound size (cm):   Length: 5 cm        width: 4 cm     depth: 15 cm  Debridement depth beyond dead/damaged tissue down to healthy viable tissue: no  Tissue layer involved: skin, subcutaneous tissue, muscle / fascia  Nature of tissue removed: Slough, Necrotic, Devitalized Tissue, Non-viable tissue, and Purulence  Irrigation volume: 3100 cc     Irrigation fluid type: Normal Saline   During the debridement was noted there was significant wound of necrotic tissue on the dorsal and plantar soft tissues.  There is exposed metatarsal bones and phalanx.  The middle toe was virtually devitalized and hanging on by joint capsule.  The dorsal soft tissue was intact.  Attempt was made to adequately debride this tissue however there was a significant mount of necrosis present.  After our best attempt at debridement further irrigation was performed.  Then wound VAC was placed.  Sponge was placed within the dorsal cavity and plantar cavity.  The middle toe was quite dusky after debridement and the soft tissue on the plantar, medial and lateral aspect of the middle toe had been completely necrotized by the bacteria.  This tissue had been removed.  There is exposed phalanx and tendinous material.  The wound VAC sponge was placed within the plantar forefoot as well as the dorsal cavity and within the plantar midfoot region.  Combination of the sponges were used to create a confluent wound VAC space.  Adaptic was placed  under the sponges where there was skin loss but no significant cavity.  The wound VAC had good suction after application.  Ace wrap was placed around the foot.  She was awakened from anesthesia and taken recovery in stable condition.  There were no complications.  Counts were correct.   POST OPERATIVE INSTRUCTIONS: Nonweightbearing to operative extremity Keep VAC in place Patient will require wound VAC change and repeat debridement versus transmetatarsal versus below the knee amputation based on response to wound VAC treatment and antibiotics. We will follow-up on cultures.  TOURNIQUET TIME: No tourniquet was used  BLOOD LOSS:  less than 50 mL         DRAINS: none         SPECIMEN: none       COMPLICATIONS:  * No complications entered in OR log *         Disposition: PACU - hemodynamically stable.         Condition: stable

## 2021-04-06 NOTE — Consult Note (Signed)
Reason for Consult: Right foot diabetic infection Referring Physician: Ophelia Charter, MD  Sherri Dixon is an 40 y.o. female.  HPI: Patient states 1 week ago she stepped on some glass on the back of her foot.  Since that time she has been developing pain and swelling that has been worsening over the last week.  She has had no pain prior to this injury.  She has swelling and discomfort and therefore presented to the emergency department.  In the emergency department x-rays revealed gas within the soft tissues concerning for necrotizing infection and physical exam was concerning for the same.  No obvious bony destruction seen on x-ray that would indicate osteomyelitis.  She was transferred to [facility] for definitive treatment.  Patient has pain in her foot.  She states has been worsening since last night.  She denies any other joint or extremity pain.  Past Medical History:  Diagnosis Date   Anemia    Asthma    CHF (congestive heart failure) (HCC)    Pt reports that father has CHF, not her   Diabetes mellitus without complication ([place])    Dyslipidemia    GERD (gastroesophageal reflux disease)    Pulmonary embolism (HCC)    Stroke [facility])     Past Surgical History:  Procedure Laterality Date   MOUTH SURGERY     ROBOTIC ASSITED PARTIAL NEPHRECTOMY Right 07/28/2019   Procedure: XI ROBOTIC ASSITED PARTIAL NEPHRECTOMY;  Surgeon: Cleon Gustin, MD;  Location: [facility];  Service: Urology;  Laterality: Right;  3 HRS    Family History  Problem Relation Age of Onset   High blood pressure Mother    Diabetes Mother    Arthritis Mother    Hyperlipidemia Mother    High blood pressure Father    Hyperlipidemia Father    High blood pressure Sister    Diabetes Brother    Hyperlipidemia Brother    High blood pressure Brother     Social History:  reports that she has never smoked. She has never used smokeless tobacco. She reports current alcohol use of about 1.0 standard drink of alcohol per week. She  reports that she does not use drugs.  Allergies:  Allergies  Allergen Reactions   Amlodipine Swelling   Humalog Kwikpen [Insulin Lispro] Other (See Comments)    75/50---swelling and ulcers in mouth    Amoxicillin     constipation    Medications: I have reviewed the patient's current medications.  Results for orders placed or performed during the hospital encounter of 04/05/21 (from the past 48 hour(s))  Resp Panel by RT-PCR (Flu A&B, Covid) Nasopharyngeal Swab     Status: None   Collection Time: 04/05/21  4:40 PM   Specimen: Nasopharyngeal Swab; Nasopharyngeal(NP) swabs in vial transport medium  Result Value Ref Range   SARS Coronavirus 2 by RT PCR NEGATIVE NEGATIVE    Comment: (NOTE) SARS-CoV-2 target nucleic acids are NOT DETECTED.  The SARS-CoV-2 RNA is generally detectable in upper respiratory specimens during the acute phase of infection. The lowest concentration of SARS-CoV-2 viral copies this assay can detect is 138 copies/mL. A negative result does not preclude SARS-Cov-2 infection and should not be used as the sole basis for treatment or other patient management decisions. A negative result may occur with  improper specimen collection/handling, submission of specimen other than nasopharyngeal swab, presence of viral mutation(s) within the areas targeted by this assay, and inadequate number of viral copies(<138 copies/mL). A negative result must be combined with clinical  observations, patient history, and epidemiological information. The expected result is Negative.  Fact Sheet for Patients:  EntrepreneurPulse.com.au  Fact Sheet for Healthcare Providers:  IncredibleEmployment.be  This test is no t yet approved or cleared by the [place] FDA and  has been authorized for detection and/or diagnosis of SARS-CoV-2 by FDA under an Emergency Use Authorization (EUA). This EUA will remain  in effect (meaning this test can be used)  for the duration of the COVID-19 declaration under Section 564(b)(1) of the Act, 21 U.S.C.section 360bbb-3(b)(1), unless the authorization is terminated  or revoked sooner.       Influenza A by PCR NEGATIVE NEGATIVE   Influenza B by PCR NEGATIVE NEGATIVE    Comment: (NOTE) The Xpert Xpress SARS-CoV-2/FLU/RSV plus assay is intended as an aid in the diagnosis of influenza from Nasopharyngeal swab specimens and should not be used as a sole basis for treatment. Nasal washings and aspirates are unacceptable for Xpert Xpress SARS-CoV-2/FLU/RSV testing.  Fact Sheet for Patients: EntrepreneurPulse.com.au  Fact Sheet for Healthcare Providers: IncredibleEmployment.be  This test is not yet approved or cleared by the [place] FDA and has been authorized for detection and/or diagnosis of SARS-CoV-2 by FDA under an Emergency Use Authorization (EUA). This EUA will remain in effect (meaning this test can be used) for the duration of the COVID-19 declaration under Section 564(b)(1) of the Act, 21 U.S.C. section 360bbb-3(b)(1), unless the authorization is terminated or revoked.  Performed at [facility], [place] [address] [place], [place] [address]   Lactic acid, plasma     Status: None   Collection Time: 04/05/21  5:22 PM  Result Value Ref Range   Lactic Acid, Venous 1.6 0.5 - 1.9 mmol/L    Comment: Performed at [facility], [place] [address], [place], [place] [address]  Comprehensive metabolic panel     Status: Abnormal   Collection Time: 04/05/21  5:22 PM  Result Value Ref Range   Sodium 134 (L) 135 - 145 mmol/L   Potassium 3.4 (L) 3.5 - 5.1 mmol/L   Chloride 102 98 - 111 mmol/L   CO2 24 22 - 32 mmol/L   Glucose, Bld 353 (H) 70 - 99 mg/dL    Comment: Glucose reference range applies only to samples taken after fasting for at least 8 hours.   BUN 19 6 - 20 mg/dL   Creatinine, Ser 1.49 (H) 0.44 - 1.00 mg/dL    Calcium 7.8 (L) 8.9 - 10.3 mg/dL   Total Protein 5.9 (L) 6.5 - 8.1 g/dL   Albumin <1.0 (L) 3.5 - 5.0 g/dL   AST 16 15 - 41 U/L   ALT 18 0 - 44 U/L   Alkaline Phosphatase 266 (H) 38 - 126 U/L   Total Bilirubin 0.4 0.3 - 1.2 mg/dL   GFR, Estimated 45 (L) >60 mL/min    Comment: (NOTE) Calculated using the CKD-EPI Creatinine Equation (2021)    Anion gap 8 5 - 15    Comment: Performed at [facility], [place] [address] [place], [place] [address]  CBC WITH DIFFERENTIAL     Status: Abnormal   Collection Time: 04/05/21  5:22 PM  Result Value Ref Range   WBC 26.2 (H) 4.0 - 10.5 K/uL   RBC 3.21 (L) 3.87 - 5.11 MIL/uL   Hemoglobin 8.6 (L) 12.0 - 15.0 g/dL   HCT 27.7 (L) 36.0 - 46.0 %   MCV 86.3 80.0 - 100.0 fL   MCH 26.8 26.0 - 34.0  pg   MCHC 31.0 30.0 - 36.0 g/dL   RDW 15.2 11.5 - 15.5 %   Platelets 546 (H) 150 - 400 K/uL   nRBC 0.0 0.0 - 0.2 %   Neutrophils Relative % 92 %   Neutro Abs 24.1 (H) 1.7 - 7.7 K/uL   Lymphocytes Relative 6 %   Lymphs Abs 1.6 0.7 - 4.0 K/uL   Monocytes Relative 2 %   Monocytes Absolute 0.5 0.1 - 1.0 K/uL   Eosinophils Relative 0 %   Eosinophils Absolute 0.0 0.0 - 0.5 K/uL   Basophils Relative 0 %   Basophils Absolute 0.0 0.0 - 0.1 K/uL   WBC Morphology VACUOLATED NEUTROPHILS    Abs Immature Granulocytes 0.00 0.00 - 0.07 K/uL    Comment: Performed at [facility], [place] [address] [place], [place] [address]  Protime-INR     Status: None   Collection Time: 04/05/21  5:22 PM  Result Value Ref Range   Prothrombin Time 12.9 11.4 - 15.2 seconds   INR 1.0 0.8 - 1.2    Comment: Performed at [facility], [place] [address] [place], [place] [address]  APTT     Status: None   Collection Time: 04/05/21  5:22 PM  Result Value Ref Range   aPTT 34 24 - 36 seconds    Comment: Performed at [facility], [place] [address], [place], [place] [address]  Blood Culture (routine x 2)     Status: None  (Preliminary result)   Collection Time: 04/05/21  5:23 PM   Specimen: BLOOD  Result Value Ref Range   Specimen Description      BLOOD BLOOD RIGHT ARM Performed at [place] [address], [place], [place] [address]    Special Requests      BOTTLES DRAWN AEROBIC ONLY Blood Culture adequate volume Performed at [place] [address] [place], [place] [address]    Culture      NO GROWTH < 12 HOURS Performed at [place] [address], [place], [place] [address]    Report Status PENDING   I-Stat beta hCG blood, ED     Status: None   Collection Time: 04/05/21  5:28 PM  Result Value Ref Range   I-stat hCG, quantitative <5.0 <5 mIU/mL   Comment 3            Comment:   GEST. AGE      CONC.  (mIU/mL)   <=1 WEEK        5 - 50     2 WEEKS       50 - 500     3 WEEKS       100 - 10,000     4 WEEKS     1,000 - 30,000        FEMALE AND NON-PREGNANT FEMALE:     LESS THAN 5 mIU/mL   Lactic acid, plasma     Status: None   Collection Time: 04/05/21  6:39 PM  Result Value Ref Range   Lactic Acid, Venous 1.4 0.5 - 1.9 mmol/L    Comment: Performed at [facility], [place] [address], [place], [place] [address]  Blood Culture (routine x 2)     Status: None (Preliminary result)   Collection Time: 04/05/21  7:30 PM   Specimen: BLOOD  Result Value Ref Range   Specimen Description      BLOOD RIGHT ANTECUBITAL Performed  at [facility], [place] [address], [place], [place] [address]    Special Requests      BOTTLES DRAWN AEROBIC AND ANAEROBIC Blood Culture adequate volume Performed at [place] [address] [place], [place] [address]    Culture      NO GROWTH < 12 HOURS Performed at [place] [address] [place], [place] [address]    Report Status PENDING   Type and screen [place]     Status: None   Collection Time: 04/05/21  9:41 PM  Result  Value Ref Range   ABO/RH(D) A POS    Antibody Screen NEG    Sample Expiration      04/08/2021,2359 Performed at [facility], [place] [address] [place], [place] [address]   CBG monitoring, ED     Status: Abnormal   Collection Time: 04/05/21  9:47 PM  Result Value Ref Range   Glucose-Capillary 317 (H) 70 - 99 mg/dL    Comment: Glucose reference range applies only to samples taken after fasting for at least 8 hours.  Glucose, capillary     Status: Abnormal   Collection Time: 04/05/21 11:46 PM  Result Value Ref Range   Glucose-Capillary 248 (H) 70 - 99 mg/dL    Comment: Glucose reference range applies only to samples taken after fasting for at least 8 hours.  CBC     Status: Abnormal   Collection Time: 04/06/21  3:03 AM  Result Value Ref Range   WBC 22.8 (H) 4.0 - 10.5 K/uL   RBC 2.54 (L) 3.87 - 5.11 MIL/uL   Hemoglobin 7.0 (L) 12.0 - 15.0 g/dL   HCT 21.4 (L) 36.0 - 46.0 %   MCV 84.3 80.0 - 100.0 fL   MCH 27.6 26.0 - 34.0 pg   MCHC 32.7 30.0 - 36.0 g/dL   RDW 15.0 11.5 - 15.5 %   Platelets 385 150 - 400 K/uL   nRBC 0.0 0.0 - 0.2 %    Comment: Performed at [facility], [place] [address], [place], [place] [address]  Glucose, capillary     Status: Abnormal   Collection Time: 04/06/21  3:59 AM  Result Value Ref Range   Glucose-Capillary 153 (H) 70 - 99 mg/dL    Comment: Glucose reference range applies only to samples taken after fasting for at least 8 hours.  Basic metabolic panel     Status: Abnormal   Collection Time: 04/06/21  5:26 AM  Result Value Ref Range   Sodium 135 135 - 145 mmol/L   Potassium 3.3 (L) 3.5 - 5.1 mmol/L   Chloride 104 98 - 111 mmol/L   CO2 25 22 - 32 mmol/L   Glucose, Bld 144 (H) 70 - 99 mg/dL    Comment: Glucose reference range applies only to samples taken after fasting for at least 8 hours.   BUN 19 6 - 20 mg/dL   Creatinine, Ser 1.65 (H) 0.44 - 1.00 mg/dL   Calcium 7.5 (L) 8.9 - 10.3 mg/dL   GFR, Estimated 40 (L) >60 mL/min     Comment: (NOTE) Calculated using the CKD-EPI Creatinine Equation (2021)    Anion gap 6 5 - 15    Comment: Performed at [place] [address], [place], [place] [address]  Magnesium     Status: None   Collection Time: 04/06/21  5:26 AM  Result Value Ref Range   Magnesium 1.8 1.7 -  2.4 mg/dL    Comment: Performed at [facility], [place] [address] [place], [place] [address]  Glucose, capillary     Status: Abnormal   Collection Time: 04/06/21  7:49 AM  Result Value Ref Range   Glucose-Capillary 126 (H) 70 - 99 mg/dL    Comment: Glucose reference range applies only to samples taken after fasting for at least 8 hours.   Comment 1 Notify RN     DG Foot Complete Right  Result Date: 04/05/2021 CLINICAL DATA:  Infection EXAM: RIGHT FOOT COMPLETE - 3+ VIEW COMPARISON:  None. FINDINGS: Vascular calcifications. No fracture or malalignment. No radiopaque foreign body. Diffuse soft tissue swelling. Fairly extensive gas within the soft tissues over the dorsum and plantar aspect of the foot, centered over the second third and fourth distal digits. IMPRESSION: Edema with fairly extensive soft tissue emphysema concerning for necrotizing infection. No acute osseous abnormality Electronically Signed   By: Donavan Foil M.D.   On: 04/05/2021 18:01    Review of Systems  Constitutional: Negative.   HENT: Negative.    Respiratory: Negative.    Cardiovascular: Negative.   Gastrointestinal: Negative.   Musculoskeletal:        Right foot pain  Skin:  Positive for wound.  Psychiatric/Behavioral: Negative.    Blood pressure 102/60, pulse 90, temperature 99.8 F (37.7 C), temperature source Oral, resp. rate 16, height 5\' 2"  (1.575 m), weight (!) 154.2 kg, last menstrual period 04/05/2021, SpO2 99 %. Physical Exam HENT:     Head: Atraumatic.     Mouth/Throat:     Mouth: Mucous membranes are moist.  Eyes:     Extraocular Movements: Extraocular movements intact.  Cardiovascular:     Rate and  Rhythm: Normal rate.  Pulmonary:     Effort: Pulmonary effort is normal.  Abdominal:     Palpations: Abdomen is soft.  Musculoskeletal:     Cervical back: Neck supple.     Comments: Right foot with significant swelling of the forefoot and plantar foot.  There is a large abscess noted on the plantar forefoot with fluctuance there.  She has a small draining wound toward the posterior aspect of her plantar foot.  There is purulent drainage there.  There is foul odor.  Dorsally there is swelling and erythema but no significant area of fluctuance.  No pain with manipulation of the ankle joint.  Tender to palpation about the forefoot.  Baseline sensory deficits.  Unable to palpate pulses due to body habitus and swelling.  Neurological:     Mental Status: She is alert. Mental status is at baseline.  Psychiatric:        Mood and Affect: Mood normal.    Assessment/Plan: Patient has a right foot diabetic infection in the setting of puncture wound and possible foreign body.  We had a lengthy conversation about her diagnosis.  There is no obvious bony destruction on x-ray and no MRI is available currently.  Given her worsening pain and signs of abscess on exam we will plan for incision and drainage of the abscess with debridement of the underlying tissue as needed.  We will try to remove the foreign body if it is visualized.  She does understand that this may not eradicate her infection completely and she may ultimately require some form of amputation.  She may end up with a wound VAC after this procedure as well depending on findings and extent of debridement.  We discussed the risks, benefits and alternatives to  surgery which include but are not limited to wound healing complications, infection, need for further surgery, damage to surrounding structures, continued infection and ultimately amputation.  Erle Crocker 04/06/2021, 10:23 AM

## 2021-04-06 NOTE — Progress Notes (Signed)
Inpatient Diabetes Program Recommendations  AACE/ADA: New Consensus Statement on Inpatient Glycemic Control (2015)  Target Ranges:  Prepandial:   less than 140 mg/dL      Peak postprandial:   less than 180 mg/dL (1-2 hours)      Critically ill patients:  140 - 180 mg/dL   Lab Results  Component Value Date   GLUCAP 126 (H) 04/06/2021   HGBA1C 8.9 (H) 01/26/2021    Review of Glycemic Control  Diabetes history: DM 2 Outpatient Diabetes medications: Farxiga 10 mg prn, Basaglar 40-50 units qhs, Novolog 12-20 units tid Current orders for Inpatient glycemic control:  Novolog 0-9 units Q4 hours  Consult poorly controlled Diabetes  Glucose on presentation elevated  Last A1c resulted at 8.9% on 4/30  Inpatient Diabetes Program Recommendations:   Current Glucose 126. Watch for now. Add basal if trends increase.  - obtain another A1c level  Thank,s Tama Headings RN, MSN, BC-ADM Inpatient Diabetes Coordinator Team Pager [phone] (8a-5p)

## 2021-04-06 NOTE — Progress Notes (Signed)
Sherri Dixon is a 40 y.o. female patient admitted. Awake, alert - oriented  X 4 - no acute distress noted.  VSS - Blood pressure (!) 115/49, pulse 92, temperature 99.3 F (37.4 C), temperature source Oral, resp. rate 18, height 5\' 2"  (1.575 m), weight (!) 154.2 kg, last menstrual period 04/05/2021, SpO2 99 %.    IV in place, occlusive dsg intact without redness.     Will cont to eval and treat per MD orders.  Vidal Schwalbe, RN 04/06/2021 12:25 AM

## 2021-04-06 NOTE — Progress Notes (Signed)
PHARMACY NOTE: ANTIMICROBIAL RENAL DOSAGE ADJUSTMENT  Current antimicrobial regimen includes a mismatch between antimicrobial dosage and estimated renal function.  As per policy approved by the Pharmacy & Therapeutics and Medical Executive Committees, the antimicrobial dosage will be adjusted accordingly.  Current antimicrobial dosage: Vancomycin IV 1500 mg q24h  Indication: Cellulitis  Renal Function: SCr increased from 1.49 to 1.65  Estimated Creatinine Clearance: 64.3 mL/min (A) (by C-G formula based on SCr of 1.65 mg/dL (H)).    Antimicrobial dosage has been changed to: Vancomycin IV 1250 mg q24h (estimated AUC 483, goal 400-550)  Additional comments: Due to risk of AKI with vancomycin and piperacillin/tazobactam being used together, would recommend against using this antimicrobial combination.   Thank you for allowing pharmacy to be a part of this patient's care.  Shauna Hugh, PharmD, [place]  PGY-1 Pharmacy Resident 04/06/2021 10:35 AM  Please check [email or web address] for unit-specific pharmacy phone numbers.

## 2021-04-07 ENCOUNTER — Inpatient Hospital Stay (HOSPITAL_COMMUNITY): Payer: 59

## 2021-04-07 ENCOUNTER — Encounter (HOSPITAL_COMMUNITY): Payer: Self-pay | Admitting: Orthopaedic Surgery

## 2021-04-07 DIAGNOSIS — L089 Local infection of the skin and subcutaneous tissue, unspecified: Secondary | ICD-10-CM

## 2021-04-07 DIAGNOSIS — Z794 Long term (current) use of insulin: Secondary | ICD-10-CM | POA: Diagnosis not present

## 2021-04-07 DIAGNOSIS — E1165 Type 2 diabetes mellitus with hyperglycemia: Secondary | ICD-10-CM | POA: Diagnosis not present

## 2021-04-07 DIAGNOSIS — N179 Acute kidney failure, unspecified: Secondary | ICD-10-CM | POA: Diagnosis not present

## 2021-04-07 LAB — GLUCOSE, CAPILLARY
Glucose-Capillary: 247 mg/dL — ABNORMAL HIGH (ref 70–99)
Glucose-Capillary: 237 mg/dL — ABNORMAL HIGH (ref 70–99)
Glucose-Capillary: 197 mg/dL — ABNORMAL HIGH (ref 70–99)
Glucose-Capillary: 165 mg/dL — ABNORMAL HIGH (ref 70–99)

## 2021-04-07 LAB — CBC
HCT: 24.2 % — ABNORMAL LOW (ref 36.0–46.0)
Hemoglobin: 7.6 g/dL — ABNORMAL LOW (ref 12.0–15.0)
MCH: 26.4 pg (ref 26.0–34.0)
MCHC: 31.4 g/dL (ref 30.0–36.0)
MCV: 84 fL (ref 80.0–100.0)
Platelets: 421 10*3/uL — ABNORMAL HIGH (ref 150–400)
RBC: 2.88 MIL/uL — ABNORMAL LOW (ref 3.87–5.11)
RDW: 15.3 % (ref 11.5–15.5)
WBC: 21 10*3/uL — ABNORMAL HIGH (ref 4.0–10.5)
nRBC: 0 % (ref 0.0–0.2)

## 2021-04-07 LAB — BASIC METABOLIC PANEL
Anion gap: 7 (ref 5–15)
BUN: 23 mg/dL — ABNORMAL HIGH (ref 6–20)
CO2: 23 mmol/L (ref 22–32)
Calcium: 7.7 mg/dL — ABNORMAL LOW (ref 8.9–10.3)
Chloride: 103 mmol/L (ref 98–111)
Creatinine, Ser: 1.84 mg/dL — ABNORMAL HIGH (ref 0.44–1.00)
GFR, Estimated: 35 mL/min — ABNORMAL LOW (ref >60)
Glucose, Bld: 258 mg/dL — ABNORMAL HIGH (ref 70–99)
Potassium: 3.9 mmol/L (ref 3.5–5.1)
Sodium: 133 mmol/L — ABNORMAL LOW (ref 135–145)

## 2021-04-07 LAB — URINALYSIS, ROUTINE W REFLEX MICROSCOPIC
Bacteria, UA: NONE SEEN
Bilirubin Urine: NEGATIVE
Glucose, UA: >=500 mg/dL — AB
Hgb urine dipstick: NEGATIVE
Ketones, ur: NEGATIVE mg/dL
Leukocytes,Ua: NEGATIVE
Nitrite: NEGATIVE
Protein, ur: 100 mg/dL — AB
Specific Gravity, Urine: 1.018 (ref 1.005–1.030)
pH: 5 (ref 5.0–8.0)

## 2021-04-07 LAB — SODIUM, URINE, RANDOM: Sodium, Ur: 32 mmol/L

## 2021-04-07 LAB — OSMOLALITY, URINE: Osmolality, Ur: 423 mOsm/kg (ref 300–900)

## 2021-04-07 LAB — CREATININE, URINE, RANDOM: Creatinine, Urine: 80.92 mg/dL

## 2021-04-07 LAB — CK: Total CK: 30 U/L — ABNORMAL LOW (ref 38–234)

## 2021-04-07 IMAGING — US US RENAL
1 series · 14 of 25 positions shown · non-contrast
Comparison: [DATE]

CLINICAL DATA: AK I

Urinary retention
EXAM:
RENAL / URINARY TRACT ULTRASOUND COMPLETE

[Series 1: us renal · 14 of 35 slices shown]
[im 1/35]
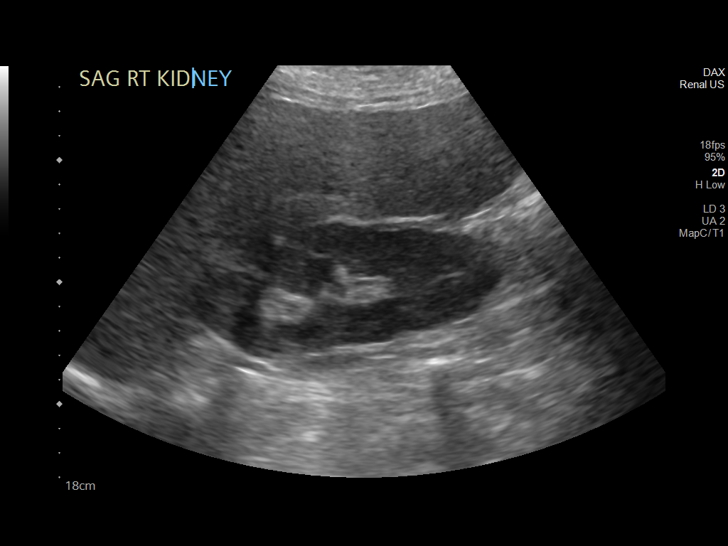
[im 3/35]
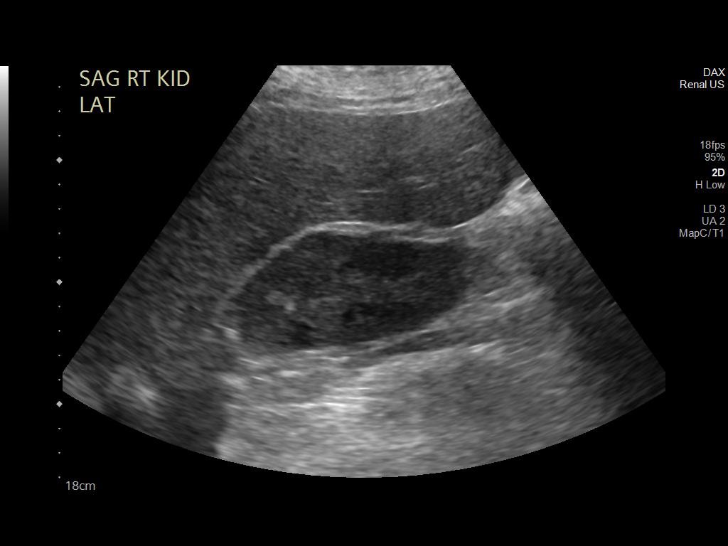
[im 6/35]
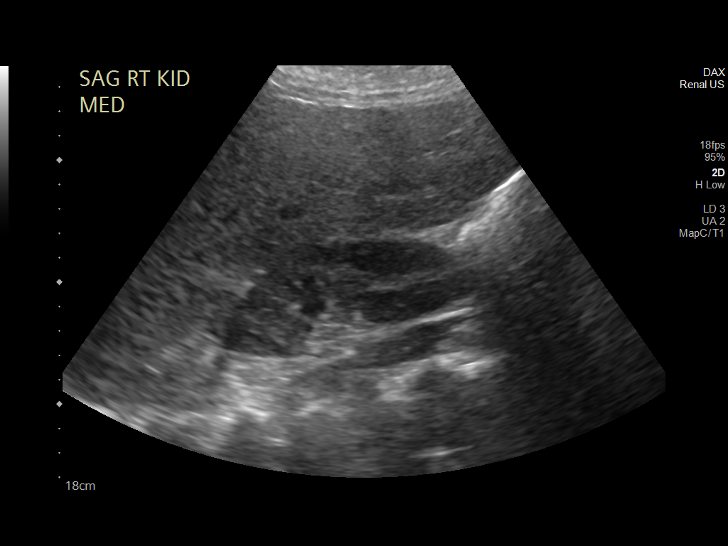
[im 9/35]
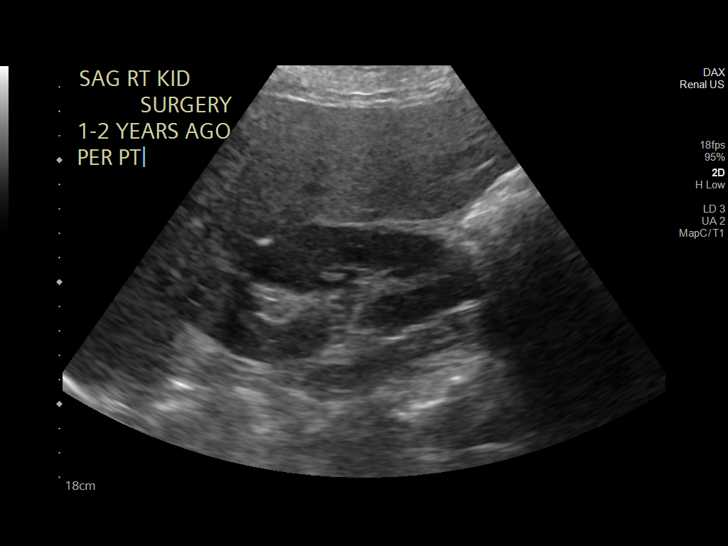
[im 12/35]
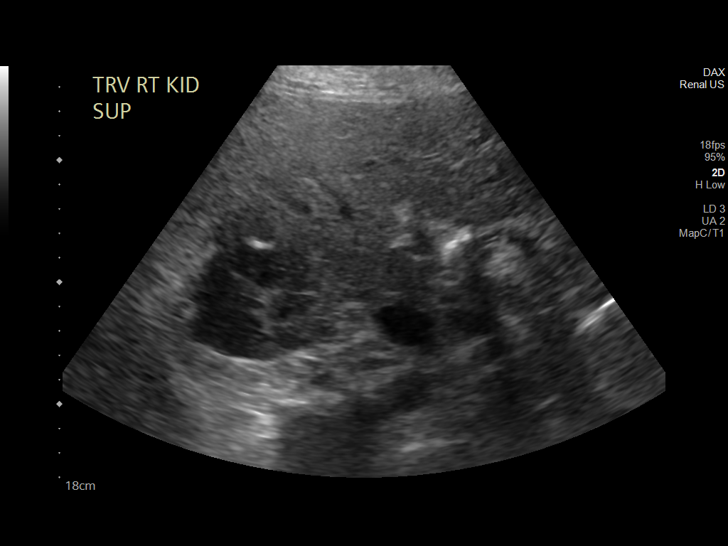
[im 13/35]
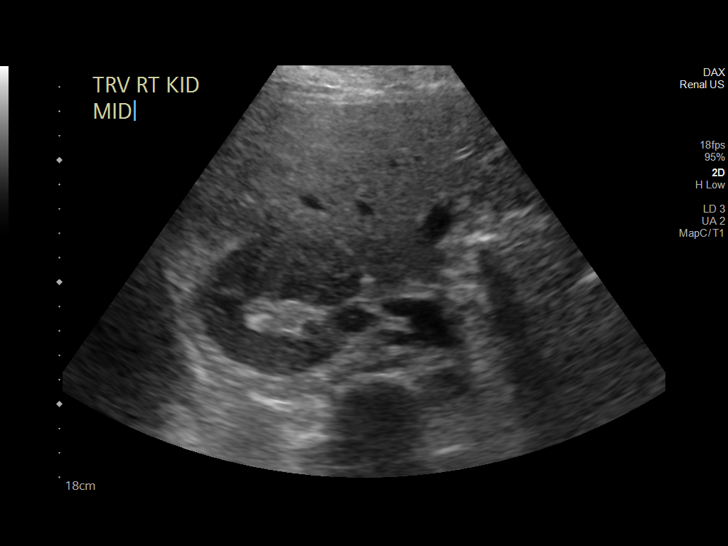
[im 16/35]
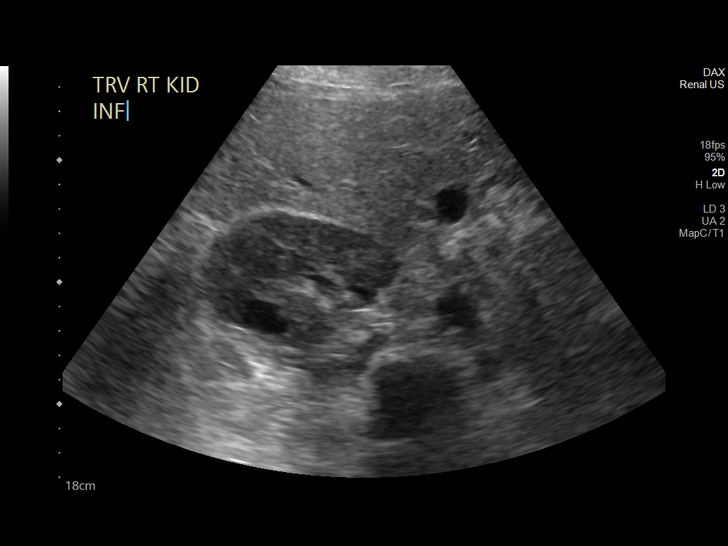
[im 19/35]
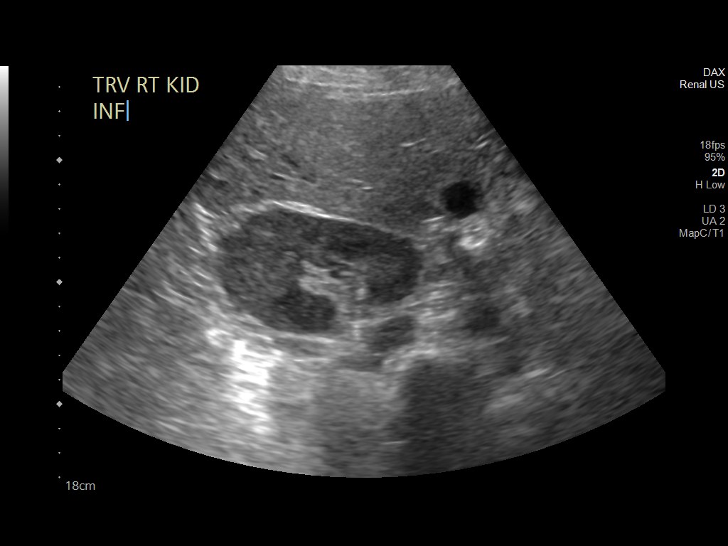
[im 22/35]
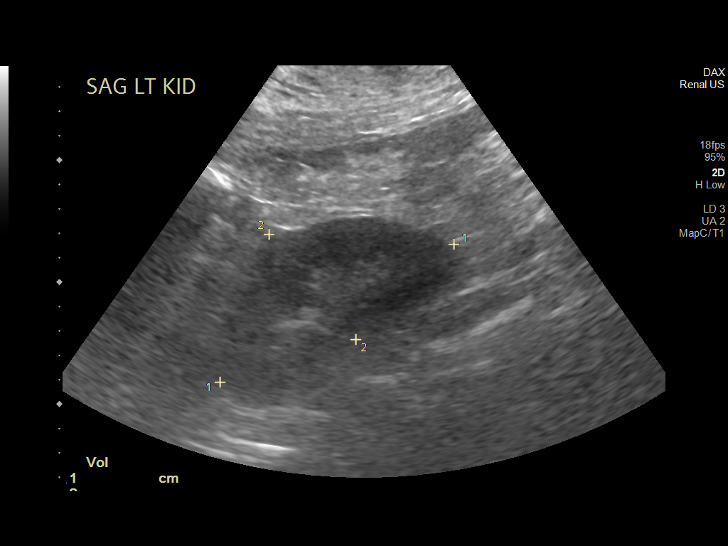
[im 23/35]
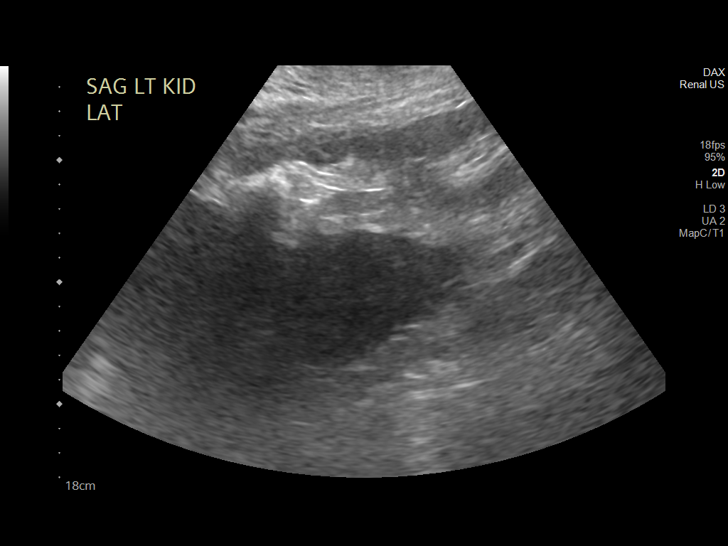
[im 26/35]
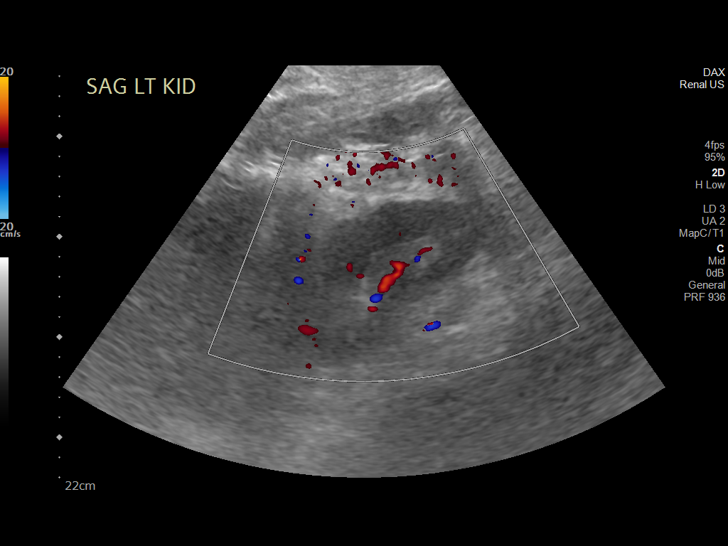
[im 29/35]
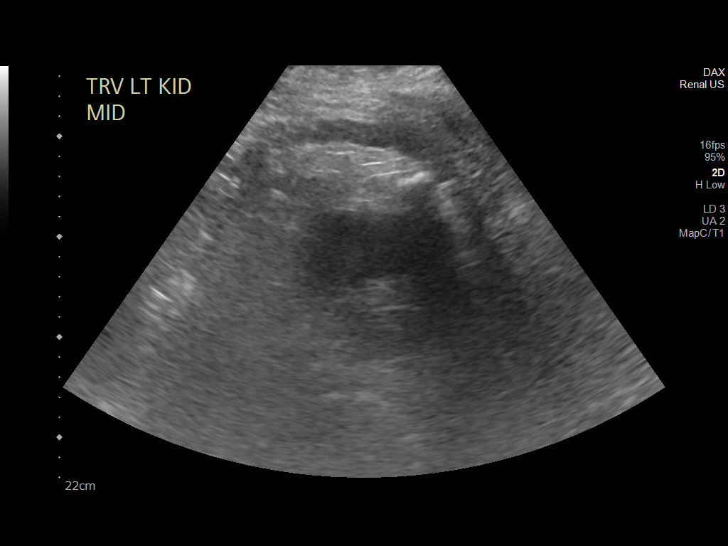
[im 32/35]
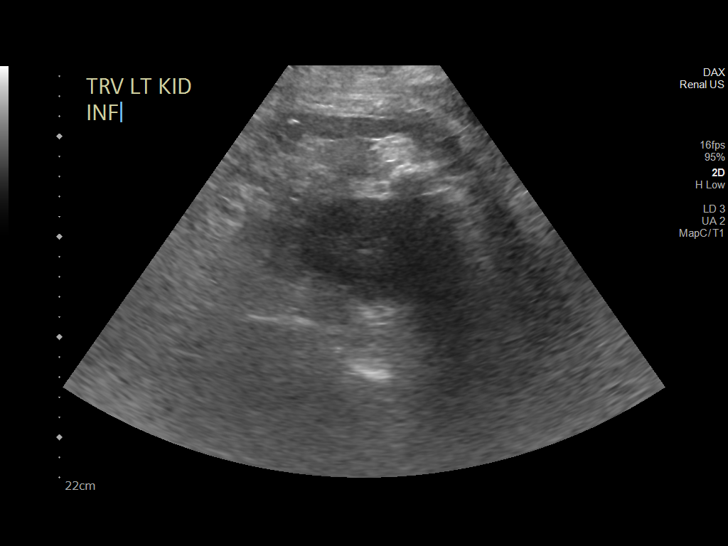
[im 35/35]
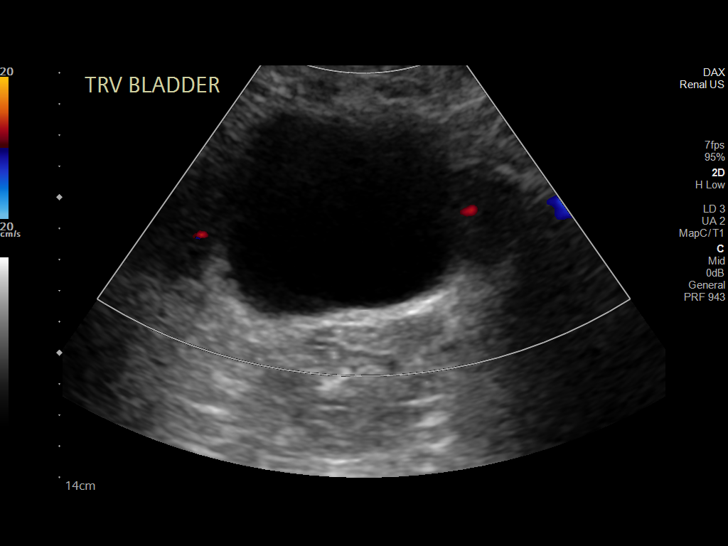

[14 of 25 positions shown; findings below may reference images not displayed]

FINDINGS: Right Kidney:

Renal measurements: 12.8 x 5.1 x 6.7 cm = volume: 229 mL.
Echogenicity within normal limits. No mass or hydronephrosis
visualized. 3.0 cm simple cyst seen in the upper pole.

Left Kidney:

Renal measurements: 11.1 x 5.6 x 6.5 cm = volume: 212 mL.
Echogenicity within normal limits. No mass or hydronephrosis
visualized.

Bladder:

Appears normal for degree of bladder distention.

Other:

None.
IMPRESSION: No significant sonographic abnormality of the kidneys.

## 2021-04-07 MED ORDER — CHLORHEXIDINE GLUCONATE CLOTH 2 % EX PADS
6.0000 | MEDICATED_PAD | Freq: Every day | CUTANEOUS | Status: AC
  Administered 2021-04-07 – 2021-04-11 (×4): 6 via TOPICAL

## 2021-04-07 MED ORDER — MUPIROCIN 2 % EX OINT
1.0000 "application " | TOPICAL_OINTMENT | Freq: Two times a day (BID) | CUTANEOUS | Status: AC
  Administered 2021-04-07 – 2021-04-11 (×10): 1 via NASAL
  Filled 2021-04-07 (×2): qty 22

## 2021-04-07 MED ORDER — SODIUM CHLORIDE 0.9 % IV SOLN
1250.0000 mg | Freq: Every day | INTRAVENOUS | Status: DC
  Administered 2021-04-07: 1250 mg via INTRAVENOUS
  Filled 2021-04-07 (×2): qty 25

## 2021-04-07 MED ORDER — INSULIN GLARGINE 100 UNIT/ML ~~LOC~~ SOLN
20.0000 [IU] | Freq: Every day | SUBCUTANEOUS | Status: DC
  Administered 2021-04-07: 20 [IU] via SUBCUTANEOUS
  Filled 2021-04-07 (×3): qty 0.2

## 2021-04-07 NOTE — Consult Note (Signed)
[facility]  Total days of antibiotics 3/piptazo/clinda/vanco         Reason for Consult:right foot necrotizing abscess   Referring Physician: nettey  Principal Problem:   Right foot infection Active Problems:   Hypokalemia   Sepsis ([place])   Diabetes ([place])   AKI (acute kidney injury) ([place])    HPI: Sherri Dixon is a 40 y.o. female with hx of asthma, HTN, HLD, obesity with BMI 60, IDDM, hx of CVA with mild left sided weakness in 5/22 on xarelto who was admitted on 7/8 for worsening chills, subjective fevers, foot pain and drainage from wound after she sustained cut from glass roughly 1 week prior to admission. On admit, she had SIRS, with leukocytosis of 26K, tachycardia. Mild aki of cr 1/4. On exam she had purulence draining from foot. Xray showed soft tissue emphysema concerning for gas/necrotizing infection. She was started on vanco. Cefepime- switched to piptazo/clinda/vanco. She underwent I x D on 7/9 by dr Lucia Gaskins who nuted dusky 3rd digit of foot, purulence and foul odor once incision made into abscess cavity that tracked down to TM joints, exposed tended, and bone exposure. Infection tracted to based of heel and calcaneal tubersoity all to the forefoot and the base of metatarsals and toes. Wound vac placed but patient will need further debridement/amputation, at minimum TM amputation. She continues on broad spectrum abtx. She is aware of the surgical options for management of infection.  Past Medical History:  Diagnosis Date   Anemia    Asthma    CHF (congestive heart failure) (HCC)    Pt reports that father has CHF, not her   Diabetes mellitus without complication ([place])    Dyslipidemia    GERD (gastroesophageal reflux disease)    Pulmonary embolism (HCC)    Stroke (HCC)     Allergies:  Allergies  Allergen Reactions   Amlodipine Swelling   Humalog Kwikpen [Insulin Lispro] Other (See Comments)    75/50---swelling and ulcers in mouth    Amoxicillin      constipation    MEDICATIONS:  Chlorhexidine Gluconate Cloth  6 each Topical Q0600   insulin aspart  0-9 Units Subcutaneous TID WC   insulin glargine  10 Units Subcutaneous QHS   mupirocin ointment  1 application Nasal BID    Social History   Tobacco Use   Smoking status: Never   Smokeless tobacco: Never  Vaping Use   Vaping Use: Never used  Substance Use Topics   Alcohol use: Yes    Alcohol/week: 1.0 standard drink    Types: 1 Glasses of wine per week    Comment: occas   Drug use: No    Family History  Problem Relation Age of Onset   High blood pressure Mother    Diabetes Mother    Arthritis Mother    Hyperlipidemia Mother    High blood pressure Father    Hyperlipidemia Father    High blood pressure Sister    Diabetes Brother    Hyperlipidemia Brother    High blood pressure Brother      Review of Systems  Constitutional: Negative for fever, chills, diaphoresis, activity change, appetite change, fatigue and unexpected weight change.  HENT: Negative for congestion, sore throat, rhinorrhea, sneezing, trouble swallowing and sinus pressure.  Eyes: Negative for photophobia and visual disturbance.  Respiratory: Negative for cough, chest tightness, shortness of breath, wheezing and stridor.  Cardiovascular: Negative for chest pain, palpitations and leg swelling.  Gastrointestinal: Negative for  nausea, vomiting, abdominal pain, diarrhea, constipation, blood in stool, abdominal distention and anal bleeding.  Genitourinary: Negative for dysuria, hematuria, flank pain and difficulty urinating.  Musculoskeletal: Negative for myalgias, back pain, joint swelling, arthralgias and gait problem.  Skin: right foot wound Neurological: Negative for dizziness, tremors, weakness and light-headedness.  Hematological: Negative for adenopathy. Does not bruise/bleed easily.  Psychiatric/Behavioral: Negative for behavioral problems, confusion, sleep disturbance, dysphoric mood, decreased  concentration and agitation.    OBJECTIVE: Temp:  [97.2 F (36.2 C)-98.7 F (37.1 C)] 97.6 F (36.4 C) (07/10 0747) Pulse Rate:  [77-96] 89 (07/10 0747) Resp:  [15-19] 18 (07/10 0747) BP: (110-138)/(54-76) 138/76 (07/10 0747) SpO2:  [96 %-100 %] 100 % (07/10 0747) Physical Exam  Constitutional:  oriented to person, place, and time. appears well-developed and well-nourished. No distress.  HENT: [place]/AT, PERRLA, no scleral icterus Mouth/Throat: Oropharynx is clear and moist. No oropharyngeal exudate.  Cardiovascular: Normal rate, regular rhythm and normal heart sounds. Exam reveals no gallop and no friction rub.  No murmur heard.  Pulmonary/Chest: Effort normal and breath sounds normal. No respiratory distress.  has no wheezes.  Neck = supple, no nuchal rigidity Abdominal: Soft. Bowel sounds are normal.  exhibits no distension. There is no tenderness.  Lymphadenopathy: no cervical adenopathy. No axillary adenopathy Neurological: alert and oriented to person, place, and time.  Ext: right leg swelling. Right foot wrapped and wound vac in place; lymphadema Skin: Skin is warm and dry. No rash noted. No erythema.  Psychiatric: a normal mood and affect.  behavior is normal.    LABS: Results for orders placed or performed during the hospital encounter of 04/05/21 (from the past 48 hour(s))  Resp Panel by RT-PCR (Flu A&B, Covid) Nasopharyngeal Swab     Status: None   Collection Time: 04/05/21  4:40 PM   Specimen: Nasopharyngeal Swab; Nasopharyngeal(NP) swabs in vial transport medium  Result Value Ref Range   SARS Coronavirus 2 by RT PCR NEGATIVE NEGATIVE    Comment: (NOTE) SARS-CoV-2 target nucleic acids are NOT DETECTED.  The SARS-CoV-2 RNA is generally detectable in upper respiratory specimens during the acute phase of infection. The lowest concentration of SARS-CoV-2 viral copies this assay can detect is 138 copies/mL. A negative result does not preclude SARS-Cov-2 infection and  should not be used as the sole basis for treatment or other patient management decisions. A negative result may occur with  improper specimen collection/handling, submission of specimen other than nasopharyngeal swab, presence of viral mutation(s) within the areas targeted by this assay, and inadequate number of viral copies(<138 copies/mL). A negative result must be combined with clinical observations, patient history, and epidemiological information. The expected result is Negative.  Fact Sheet for Patients:  EntrepreneurPulse.com.au  Fact Sheet for Healthcare Providers:  IncredibleEmployment.be  This test is no t yet approved or cleared by the [place] FDA and  has been authorized for detection and/or diagnosis of SARS-CoV-2 by FDA under an Emergency Use Authorization (EUA). This EUA will remain  in effect (meaning this test can be used) for the duration of the COVID-19 declaration under Section 564(b)(1) of the Act, 21 U.S.C.section 360bbb-3(b)(1), unless the authorization is terminated  or revoked sooner.       Influenza A by PCR NEGATIVE NEGATIVE   Influenza B by PCR NEGATIVE NEGATIVE    Comment: (NOTE) The Xpert Xpress SARS-CoV-2/FLU/RSV plus assay is intended as an aid in the diagnosis of influenza from Nasopharyngeal swab specimens and should not be used as a sole basis  for treatment. Nasal washings and aspirates are unacceptable for Xpert Xpress SARS-CoV-2/FLU/RSV testing.  Fact Sheet for Patients: EntrepreneurPulse.com.au  Fact Sheet for Healthcare Providers: IncredibleEmployment.be  This test is not yet approved or cleared by the [place] FDA and has been authorized for detection and/or diagnosis of SARS-CoV-2 by FDA under an Emergency Use Authorization (EUA). This EUA will remain in effect (meaning this test can be used) for the duration of the COVID-19 declaration under Section  564(b)(1) of the Act, 21 U.S.C. section 360bbb-3(b)(1), unless the authorization is terminated or revoked.  Performed at [facility], [place] [address] [place], [place] [address]   Lactic acid, plasma     Status: None   Collection Time: 04/05/21  5:22 PM  Result Value Ref Range   Lactic Acid, Venous 1.6 0.5 - 1.9 mmol/L    Comment: Performed at [facility], [place] [address] [place], [place] [address]  Comprehensive metabolic panel     Status: Abnormal   Collection Time: 04/05/21  5:22 PM  Result Value Ref Range   Sodium 134 (L) 135 - 145 mmol/L   Potassium 3.4 (L) 3.5 - 5.1 mmol/L   Chloride 102 98 - 111 mmol/L   CO2 24 22 - 32 mmol/L   Glucose, Bld 353 (H) 70 - 99 mg/dL    Comment: Glucose reference range applies only to samples taken after fasting for at least 8 hours.   BUN 19 6 - 20 mg/dL   Creatinine, Ser 1.49 (H) 0.44 - 1.00 mg/dL   Calcium 7.8 (L) 8.9 - 10.3 mg/dL   Total Protein 5.9 (L) 6.5 - 8.1 g/dL   Albumin <1.0 (L) 3.5 - 5.0 g/dL   AST 16 15 - 41 U/L   ALT 18 0 - 44 U/L   Alkaline Phosphatase 266 (H) 38 - 126 U/L   Total Bilirubin 0.4 0.3 - 1.2 mg/dL   GFR, Estimated 45 (L) >60 mL/min    Comment: (NOTE) Calculated using the CKD-EPI Creatinine Equation (2021)    Anion gap 8 5 - 15    Comment: Performed at [facility], [place] [address], [place], [place] [address]  CBC WITH DIFFERENTIAL     Status: Abnormal   Collection Time: 04/05/21  5:22 PM  Result Value Ref Range   WBC 26.2 (H) 4.0 - 10.5 K/uL   RBC 3.21 (L) 3.87 - 5.11 MIL/uL   Hemoglobin 8.6 (L) 12.0 - 15.0 g/dL   HCT 27.7 (L) 36.0 - 46.0 %   MCV 86.3 80.0 - 100.0 fL   MCH 26.8 26.0 - 34.0 pg   MCHC 31.0 30.0 - 36.0 g/dL   RDW 15.2 11.5 - 15.5 %   Platelets 546 (H) 150 - 400 K/uL   nRBC 0.0 0.0 - 0.2 %   Neutrophils Relative % 92 %   Neutro Abs 24.1 (H) 1.7 - 7.7 K/uL   Lymphocytes Relative 6 %   Lymphs Abs 1.6 0.7 - 4.0 K/uL   Monocytes  Relative 2 %   Monocytes Absolute 0.5 0.1 - 1.0 K/uL   Eosinophils Relative 0 %   Eosinophils Absolute 0.0 0.0 - 0.5 K/uL   Basophils Relative 0 %   Basophils Absolute 0.0 0.0 - 0.1 K/uL   WBC Morphology VACUOLATED NEUTROPHILS    Abs Immature Granulocytes 0.00 0.00 - 0.07 K/uL    Comment: Performed at [facility], [place] [address], [place], [place] [address]  Protime-INR     Status:  None   Collection Time: 04/05/21  5:22 PM  Result Value Ref Range   Prothrombin Time 12.9 11.4 - 15.2 seconds   INR 1.0 0.8 - 1.2    Comment: Performed at [facility], [place] [address], [place], [place] [address]  APTT     Status: None   Collection Time: 04/05/21  5:22 PM  Result Value Ref Range   aPTT 34 24 - 36 seconds    Comment: Performed at [facility], [place] [address], [place], [place] [address]  Blood Culture (routine x 2)     Status: None (Preliminary result)   Collection Time: 04/05/21  5:23 PM   Specimen: BLOOD  Result Value Ref Range   Specimen Description      BLOOD BLOOD RIGHT ARM Performed at [facility] [address] [place], [place] [address]    Special Requests      BOTTLES DRAWN AEROBIC ONLY Blood Culture adequate volume Performed at [facility] [address] [address], [place], [place] [address]    Culture      NO GROWTH 2 DAYS Performed at [facility], [place] [address], [place], [place] [address]    Report Status PENDING   I-Stat beta hCG blood, ED     Status: None   Collection Time: 04/05/21  5:28 PM  Result Value Ref Range   I-stat hCG, quantitative <5.0 <5 mIU/mL   Comment 3            Comment:   GEST. AGE      CONC.  (mIU/mL)   <=1 WEEK        5 - 50     2 WEEKS       50 - 500     3 WEEKS       100 - 10,000     4 WEEKS     1,000 - 30,000        FEMALE AND NON-PREGNANT FEMALE:     LESS THAN 5 mIU/mL   Lactic acid, plasma     Status: None   Collection Time: 04/05/21  6:39  PM  Result Value Ref Range   Lactic Acid, Venous 1.4 0.5 - 1.9 mmol/L    Comment: Performed at [facility], [place] [address], [place], [place] [address]  Blood Culture (routine x 2)     Status: None (Preliminary result)   Collection Time: 04/05/21  7:30 PM   Specimen: BLOOD  Result Value Ref Range   Specimen Description      BLOOD RIGHT ANTECUBITAL Performed at [facility] [address] [place], [place] [address]    Special Requests      BOTTLES DRAWN AEROBIC AND ANAEROBIC Blood Culture adequate volume Performed at [facility] [address], [place], [place] [address]    Culture      NO GROWTH 2 DAYS Performed at [facility], [place] [address], [place], [place] [address]    Report Status PENDING   Type and screen [place]     Status: None   Collection Time: 04/05/21  9:41 PM  Result Value Ref Range   ABO/RH(D) A POS    Antibody Screen NEG    Sample Expiration      04/08/2021,2359 Performed at [facility], [place] [address] [place], [place] [address]   CBG monitoring, ED     Status: Abnormal   Collection  Time: 04/05/21  9:47 PM  Result Value Ref Range   Glucose-Capillary 317 (H) 70 - 99 mg/dL    Comment: Glucose reference range applies only to samples taken after fasting for at least 8 hours.  Glucose, capillary     Status: Abnormal   Collection Time: 04/05/21 11:46 PM  Result Value Ref Range   Glucose-Capillary 248 (H) 70 - 99 mg/dL    Comment: Glucose reference range applies only to samples taken after fasting for at least 8 hours.  CBC     Status: Abnormal   Collection Time: 04/06/21  3:03 AM  Result Value Ref Range   WBC 22.8 (H) 4.0 - 10.5 K/uL   RBC 2.54 (L) 3.87 - 5.11 MIL/uL   Hemoglobin 7.0 (L) 12.0 - 15.0 g/dL   HCT 21.4 (L) 36.0 - 46.0 %   MCV 84.3 80.0 - 100.0 fL   MCH 27.6 26.0 - 34.0 pg   MCHC 32.7 30.0 - 36.0 g/dL   RDW 15.0 11.5 - 15.5 %    Platelets 385 150 - 400 K/uL   nRBC 0.0 0.0 - 0.2 %    Comment: Performed at [facility], [place] [address] [place], [place] [address]  Glucose, capillary     Status: Abnormal   Collection Time: 04/06/21  3:59 AM  Result Value Ref Range   Glucose-Capillary 153 (H) 70 - 99 mg/dL    Comment: Glucose reference range applies only to samples taken after fasting for at least 8 hours.  Basic metabolic panel     Status: Abnormal   Collection Time: 04/06/21  5:26 AM  Result Value Ref Range   Sodium 135 135 - 145 mmol/L   Potassium 3.3 (L) 3.5 - 5.1 mmol/L   Chloride 104 98 - 111 mmol/L   CO2 25 22 - 32 mmol/L   Glucose, Bld 144 (H) 70 - 99 mg/dL    Comment: Glucose reference range applies only to samples taken after fasting for at least 8 hours.   BUN 19 6 - 20 mg/dL   Creatinine, Ser 1.65 (H) 0.44 - 1.00 mg/dL   Calcium 7.5 (L) 8.9 - 10.3 mg/dL   GFR, Estimated 40 (L) >60 mL/min    Comment: (NOTE) Calculated using the CKD-EPI Creatinine Equation (2021)    Anion gap 6 5 - 15    Comment: Performed at [facility] [address], [place], [place] [address]  Magnesium     Status: None   Collection Time: 04/06/21  5:26 AM  Result Value Ref Range   Magnesium 1.8 1.7 - 2.4 mg/dL    Comment: Performed at [facility] [address], [place], [place] [address]  Glucose, capillary     Status: Abnormal   Collection Time: 04/06/21  7:49 AM  Result Value Ref Range   Glucose-Capillary 126 (H) 70 - 99 mg/dL    Comment: Glucose reference range applies only to samples taken after fasting for at least 8 hours.   Comment 1 Notify RN   Surgical pcr screen     Status: Abnormal   Collection Time: 04/06/21  8:55 AM   Specimen: Nasal Mucosa; Nasal Swab  Result Value Ref Range   MRSA, PCR NEGATIVE NEGATIVE   Staphylococcus aureus POSITIVE (A) NEGATIVE    Comment: (NOTE) The Xpert SA Assay (FDA approved for NASAL specimens in patients 11 years of age and older), is one component of a  comprehensive surveillance program. It is not intended to  diagnose infection nor to guide or monitor treatment. Performed at [facility], [place] [address] [place], [place] [address]   Type and screen [place]     Status: None   Collection Time: 04/06/21 10:12 AM  Result Value Ref Range   ABO/RH(D) A POS    Antibody Screen NEG    Sample Expiration      04/09/2021,2359 Performed at [facility], [place] [address], [place], [place] [address]   Aerobic/Anaerobic Culture w Gram Stain (surgical/deep wound)     Status: None (Preliminary result)   Collection Time: 04/06/21 11:44 AM   Specimen: Abscess  Result Value Ref Range   Specimen Description ABSCESS    Special Requests RIGHT FOOT ABSCESS SPEC A    Gram Stain PENDING    Culture      CULTURE REINCUBATED FOR BETTER GROWTH Performed at [facility], [place] [address], [place], [place] [address]    Report Status PENDING   Glucose, capillary     Status: None   Collection Time: 04/06/21 12:29 PM  Result Value Ref Range   Glucose-Capillary 82 70 - 99 mg/dL    Comment: Glucose reference range applies only to samples taken after fasting for at least 8 hours.  Glucose, capillary     Status: Abnormal   Collection Time: 04/06/21  4:02 PM  Result Value Ref Range   Glucose-Capillary 139 (H) 70 - 99 mg/dL    Comment: Glucose reference range applies only to samples taken after fasting for at least 8 hours.   Comment 1 Notify RN    Comment 2 Document in Chart   Glucose, capillary     Status: Abnormal   Collection Time: 04/06/21  8:01 PM  Result Value Ref Range   Glucose-Capillary 180 (H) 70 - 99 mg/dL    Comment: Glucose reference range applies only to samples taken after fasting for at least 8 hours.  Glucose, capillary     Status: Abnormal   Collection Time: 04/06/21  9:23 PM  Result Value Ref Range   Glucose-Capillary 249 (H) 70 - 99 mg/dL    Comment: Glucose reference range applies only to samples taken  after fasting for at least 8 hours.  Glucose, capillary     Status: Abnormal   Collection Time: 04/06/21 11:21 PM  Result Value Ref Range   Glucose-Capillary 242 (H) 70 - 99 mg/dL    Comment: Glucose reference range applies only to samples taken after fasting for at least 8 hours.  CBC     Status: Abnormal   Collection Time: 04/07/21  1:23 AM  Result Value Ref Range   WBC 21.0 (H) 4.0 - 10.5 K/uL   RBC 2.88 (L) 3.87 - 5.11 MIL/uL   Hemoglobin 7.6 (L) 12.0 - 15.0 g/dL   HCT 24.2 (L) 36.0 - 46.0 %   MCV 84.0 80.0 - 100.0 fL   MCH 26.4 26.0 - 34.0 pg   MCHC 31.4 30.0 - 36.0 g/dL   RDW 15.3 11.5 - 15.5 %   Platelets 421 (H) 150 - 400 K/uL   nRBC 0.0 0.0 - 0.2 %    Comment: Performed at [facility] [address], [place], [place] [address]  Basic metabolic panel     Status: Abnormal   Collection Time: 04/07/21  1:23 AM  Result Value Ref Range   Sodium 133 (L) 135 - 145 mmol/L   Potassium 3.9 3.5 - 5.1 mmol/L   Chloride  103 98 - 111 mmol/L   CO2 23 22 - 32 mmol/L   Glucose, Bld 258 (H) 70 - 99 mg/dL    Comment: Glucose reference range applies only to samples taken after fasting for at least 8 hours.   BUN 23 (H) 6 - 20 mg/dL   Creatinine, Ser 1.84 (H) 0.44 - 1.00 mg/dL   Calcium 7.7 (L) 8.9 - 10.3 mg/dL   GFR, Estimated 35 (L) >60 mL/min    Comment: (NOTE) Calculated using the CKD-EPI Creatinine Equation (2021)    Anion gap 7 5 - 15    Comment: Performed at [facility] [address], [place], [place] [address]  Glucose, capillary     Status: Abnormal   Collection Time: 04/07/21  7:52 AM  Result Value Ref Range   Glucose-Capillary 247 (H) 70 - 99 mg/dL    Comment: Glucose reference range applies only to samples taken after fasting for at least 8 hours.  CK     Status: Abnormal   Collection Time: 04/07/21 10:05 AM  Result Value Ref Range   Total CK 30 (L) 38 - 234 U/L    Comment: Performed at [facility], [place] [address], [place], [place] [address]     MICRO: MSSA+ colonization Blood cx ngtd Right foot abscess = incubating  IMAGING: US RENAL  Result Date: 04/07/2021 CLINICAL DATA:  AK I Urinary retention EXAM: RENAL / URINARY TRACT ULTRASOUND COMPLETE COMPARISON:  01/27/2021 FINDINGS: Right Kidney: Renal measurements: 12.8 x 5.1 x 6.7 cm = volume: 229 mL. Echogenicity within normal limits. No mass or hydronephrosis visualized. 3.0 cm simple cyst seen in the upper pole. Left Kidney: Renal measurements: 11.1 x 5.6 x 6.5 cm = volume: 212 mL. Echogenicity within normal limits. No mass or hydronephrosis visualized. Bladder: Appears normal for degree of bladder distention. Other: None. IMPRESSION: No significant sonographic abnormality of the kidneys. Electronically Signed   By: Miachel Roux M.D.   On: 04/07/2021 09:16   DG Foot Complete Right  Result Date: 04/05/2021 CLINICAL DATA:  Infection EXAM: RIGHT FOOT COMPLETE - 3+ VIEW COMPARISON:  None. FINDINGS: Vascular calcifications. No fracture or malalignment. No radiopaque foreign body. Diffuse soft tissue swelling. Fairly extensive gas within the soft tissues over the dorsum and plantar aspect of the foot, centered over the second third and fourth distal digits. IMPRESSION: Edema with fairly extensive soft tissue emphysema concerning for necrotizing infection. No acute osseous abnormality Electronically Signed   By: Donavan Foil M.D.   On: 04/05/2021 18:01      Assessment/Plan:  40yo F with IDDM, obesity, admitted for deep SSTI/necrotizing infection of right foot s/p I x D x1- c/b AKI - continue on broad spectrum abtx of piptazo - due to AKI, will change vancomycin to daptomycin for the time being to see if any MRSA isolated from culture (of note, she is not colonized with MRSA) - will continue with additional day of clindamycin  - given still significant burden of infection, agree that TM or TT amputation will be in her future. If only TM, still would treat for prolonged abtx given that  infection tracked to calcaneous on initial or report  Aki = have stopped vancomycin, anticipate some improvement in aki  IDDM = continue with current regimen to help with good control to help with wound healing.

## 2021-04-07 NOTE — Progress Notes (Signed)
Pt is not able to void. In and out cath done and urine culture sent.

## 2021-04-07 NOTE — Progress Notes (Signed)
PROGRESS NOTE    Sherri Dixon  ZMO:[id number] DOB: 09-16-1981 DOA: 04/05/2021 PCP: Tomasa Hose, NP   Brief Narrative: Sherri Dixon is a 40 y.o. female with a history of asthma, chronic diastolic heart failure, hypertension, hyperlipidemia, diabetes mellitus on insulin, chronic microcytic anemia, GERD, history of PE on Xarelto, morbid obesity.  Patient presented secondary to right foot pain and swelling after stepping on a piece of glass about 1 week ago.  She is now found to have an infected foot concerning for necrotizing infection.  Orthopedic surgery was consulted and have taken her for irrigation and debridement with wound VAC placement.  Patient is placed on empiric antibiotics.   Assessment & Plan:   Principal Problem:   Right foot infection Active Problems:   Hypokalemia   Sepsis ([place])   Diabetes ([place])   AKI (acute kidney injury) ([place])   Sepsis Present on admission. Secondary to necrotizing foot infection. Patient started empirically on Vancomycin/Zosyn and clindamycin. Vancomycin discontinued and switched to Daptomycin. Blood cultures obtained. WBC down slightly -Follow-up blood cultures -Trend CBC -Vancomycin/Zosyn/Clindamycin  Right foot necrotizing infection Orthopedic surgery consulted for debridement which was performed on 7/9. Significant necrotic tissue mentioned in op note. Wound vac placed. Recommendation to continue antibiotics. Vancomycin discontinued secondary to worsening AKI -Orthopedic surgery recommendations: concern patient will need at least a transmetatarsal amputation but may require transtibial amputation -Continue Daptomycin/Zosyn/Clindamycin -ID consult -ABI, if vascular compromise suggested, will touch base with vascular surgery  AKI In setting of sepsis and decreased oral intake in addition to ARB use. Patient has a history of grade 2 diastolic heart dysfunction. Worsening.  -Continue IV fluids for now -Discontinue Vancomycin -Renal  ultrasound, urine creatinine/osmolality/sodium -Daily BMP  Urinary retention Persistent  Hypokalemia -Kdur  Acute anemia In setting of Xarelto use and heavy periods. Currently on her menstrual cycle. Hemoglobin currently stable. -Trend CBC; transfuse for hemoglobin <7  Chronic diastolic dysfunction Holding lasix for now. -Watch fluid status -Daily weight/strict in/out  Hyperlipidemia -Continue Crestor  Diabetes mellitus, type 2 Last hemoglobin A1c of 8.9% on January 26, 2021.  Patient is on Lantus 45-50 units at bedside in addition to Novolog 12-20 units TID with meals as an outpatient. Started on SSI on admission.  Blood sugar is better controlled than expected -Increase to Lantus 20 units -Continue NovoLog sliding scale insulin  History of PE On Xarelto which has been held secondary to need for surgery  Primary hypertension Patient is on hydralazine and olmesartan as an outpatient. Blood pressure is labile but normal today. Antihypertensives held on admission -Continue to hold hydralazine/olmesartan   DVT prophylaxis: SCDs Start: 04/05/21 2049 Code Status:   Code Status: Full Code Family Communication: None at bedside Disposition Plan: Discharge home likely in several days pending orthopedic surgery recommendations/transition to oral antibiotics   Consultants:  Orthopedic surgery Infectious disease  Procedures:  IRRIGATION AND DEBRIDEMENT WITH WOUND [place] (04/06/2021)  Antimicrobials: Vancomycin Zosyn Clindamycin    Subjective: No significant issues overnight. Not able to urinate. No history of constipation.  Objective: Vitals:   04/06/21 2134 04/07/21 0003 04/07/21 0400 04/07/21 0747  BP: (!) 113/54 110/60 119/60 138/76  Pulse: 87 88 87 89  Resp:  19 17 18   Temp: 98.7 F (37.1 C) 98.3 F (36.8 C) 98.3 F (36.8 C) 97.6 F (36.4 C)  TempSrc: Oral Oral Oral Oral  SpO2: 96% 99% 98% 100%  Weight:      Height:        Intake/Output Summary  (Last  24 hours) at 04/07/2021 0947 Last data filed at 04/07/2021 0500 Gross per 24 hour  Intake 1921.8 ml  Output 1436 ml  Net 485.8 ml    Filed Weights   04/05/21 1637 04/06/21 1108  Weight: (!) 154.2 kg (!) 154.2 kg    Examination:  General exam: Appears calm and comfortable Respiratory system: Clear to auscultation. Respiratory effort normal. Cardiovascular system: S1 & S2 heard, RRR. No murmurs, rubs, gallops or clicks. Gastrointestinal system: Abdomen is nondistended, soft and nontender. No organomegaly or masses felt. Normal bowel sounds heard. Central nervous system: Alert and oriented. No focal neurological deficits. Musculoskeletal: BLE edema. No calf tenderness Skin: No cyanosis. No rashes Psychiatry: Judgement and insight appear normal. Mood & affect appropriate.     Data Reviewed: I have personally reviewed following labs and imaging studies  CBC Lab Results  Component Value Date   WBC 21.0 (H) 04/07/2021   RBC 2.88 (L) 04/07/2021   HGB 7.6 (L) 04/07/2021   HCT 24.2 (L) 04/07/2021   MCV 84.0 04/07/2021   MCH 26.4 04/07/2021   PLT 421 (H) 04/07/2021   MCHC 31.4 04/07/2021   RDW 15.3 04/07/2021   LYMPHSABS 1.6 04/05/2021   MONOABS 0.5 04/05/2021   EOSABS 0.0 04/05/2021   BASOSABS 0.0 47/42/5956     Last metabolic panel Lab Results  Component Value Date   NA 133 (L) 04/07/2021   K 3.9 04/07/2021   CL 103 04/07/2021   CO2 23 04/07/2021   BUN 23 (H) 04/07/2021   CREATININE 1.84 (H) 04/07/2021   GLUCOSE 258 (H) 04/07/2021   GFRNONAA 35 (L) 04/07/2021   GFRAA 57 (L) 07/30/2019   CALCIUM 7.7 (L) 04/07/2021   PROT 5.9 (L) 04/05/2021   ALBUMIN <1.0 (L) 04/05/2021   BILITOT 0.4 04/05/2021   ALKPHOS 266 (H) 04/05/2021   AST 16 04/05/2021   ALT 18 04/05/2021   ANIONGAP 7 04/07/2021    CBG (last 3)  Recent Labs    04/06/21 2123 04/06/21 2321 04/07/21 0752  GLUCAP 249* 242* 247*      GFR: Estimated Creatinine Clearance: 57.7 mL/min (A) (by  C-G formula based on SCr of 1.84 mg/dL (H)).  Coagulation Profile: Recent Labs  Lab 04/05/21 1722  INR 1.0     Recent Results (from the past 240 hour(s))  Resp Panel by RT-PCR (Flu A&B, Covid) Nasopharyngeal Swab     Status: None   Collection Time: 04/05/21  4:40 PM   Specimen: Nasopharyngeal Swab; Nasopharyngeal(NP) swabs in vial transport medium  Result Value Ref Range Status   SARS Coronavirus 2 by RT PCR NEGATIVE NEGATIVE Final    Comment: (NOTE) SARS-CoV-2 target nucleic acids are NOT DETECTED.  The SARS-CoV-2 RNA is generally detectable in upper respiratory specimens during the acute phase of infection. The lowest concentration of SARS-CoV-2 viral copies this assay can detect is 138 copies/mL. A negative result does not preclude SARS-Cov-2 infection and should not be used as the sole basis for treatment or other patient management decisions. A negative result may occur with  improper specimen collection/handling, submission of specimen other than nasopharyngeal swab, presence of viral mutation(s) within the areas targeted by this assay, and inadequate number of viral copies(<138 copies/mL). A negative result must be combined with clinical observations, patient history, and epidemiological information. The expected result is Negative.  Fact Sheet for Patients:  EntrepreneurPulse.com.au  Fact Sheet for Healthcare Providers:  IncredibleEmployment.be  This test is no t yet approved or cleared by the [place] and  has been authorized for detection and/or diagnosis of SARS-CoV-2 by FDA under an Emergency Use Authorization (EUA). This EUA will remain  in effect (meaning this test can be used) for the duration of the COVID-19 declaration under Section 564(b)(1) of the Act, 21 U.S.C.section 360bbb-3(b)(1), unless the authorization is terminated  or revoked sooner.       Influenza A by PCR NEGATIVE NEGATIVE Final   Influenza B  by PCR NEGATIVE NEGATIVE Final    Comment: (NOTE) The Xpert Xpress SARS-CoV-2/FLU/RSV plus assay is intended as an aid in the diagnosis of influenza from Nasopharyngeal swab specimens and should not be used as a sole basis for treatment. Nasal washings and aspirates are unacceptable for Xpert Xpress SARS-CoV-2/FLU/RSV testing.  Fact Sheet for Patients: EntrepreneurPulse.com.au  Fact Sheet for Healthcare Providers: IncredibleEmployment.be  This test is not yet approved or cleared by the [place] FDA and has been authorized for detection and/or diagnosis of SARS-CoV-2 by FDA under an Emergency Use Authorization (EUA). This EUA will remain in effect (meaning this test can be used) for the duration of the COVID-19 declaration under Section 564(b)(1) of the Act, 21 U.S.C. section 360bbb-3(b)(1), unless the authorization is terminated or revoked.  Performed at [facility], [place] [address] [place], [place] [address]   Blood Culture (routine x 2)     Status: None (Preliminary result)   Collection Time: 04/05/21  5:23 PM   Specimen: BLOOD  Result Value Ref Range Status   Specimen Description   Final    BLOOD BLOOD RIGHT ARM Performed at [facility] [address], [place], [place] [address]    Special Requests   Final    BOTTLES DRAWN AEROBIC ONLY Blood Culture adequate volume Performed at [facility] [address] [place], [place] [address]    Culture   Final    NO GROWTH 2 DAYS Performed at [facility] [address], [place], [place] [address]    Report Status PENDING  Incomplete  Blood Culture (routine x 2)     Status: None (Preliminary result)   Collection Time: 04/05/21  7:30 PM   Specimen: BLOOD  Result Value Ref Range Status   Specimen Description   Final    BLOOD RIGHT ANTECUBITAL Performed at [facility] [address] [place],  [place] [address]    Special Requests   Final    BOTTLES DRAWN AEROBIC AND ANAEROBIC Blood Culture adequate volume Performed at [facility] [address], [place], [place] [address]    Culture   Final    NO GROWTH 2 DAYS Performed at [facility] [address] [place], [place] [address]    Report Status PENDING  Incomplete  Surgical pcr screen     Status: Abnormal   Collection Time: 04/06/21  8:55 AM   Specimen: Nasal Mucosa; Nasal Swab  Result Value Ref Range Status   MRSA, PCR NEGATIVE NEGATIVE Final   Staphylococcus aureus POSITIVE (A) NEGATIVE Final    Comment: (NOTE) The Xpert SA Assay (FDA approved for NASAL specimens in patients 75 years of age and older), is one component of a comprehensive surveillance program. It is not intended to diagnose infection nor to guide or monitor treatment. Performed at [facility], [place] [address], [place], [place] [address]          Radiology Studies: US RENAL  Result Date: 04/07/2021 CLINICAL DATA:  AK I Urinary retention  EXAM: RENAL / URINARY TRACT ULTRASOUND COMPLETE COMPARISON:  01/27/2021 FINDINGS: Right Kidney: Renal measurements: 12.8 x 5.1 x 6.7 cm = volume: 229 mL. Echogenicity within normal limits. No mass or hydronephrosis visualized. 3.0 cm simple cyst seen in the upper pole. Left Kidney: Renal measurements: 11.1 x 5.6 x 6.5 cm = volume: 212 mL. Echogenicity within normal limits. No mass or hydronephrosis visualized. Bladder: Appears normal for degree of bladder distention. Other: None. IMPRESSION: No significant sonographic abnormality of the kidneys. Electronically Signed   By: Miachel Roux M.D.   On: 04/07/2021 09:16   DG Foot Complete Right  Result Date: 04/05/2021 CLINICAL DATA:  Infection EXAM: RIGHT FOOT COMPLETE - 3+ VIEW COMPARISON:  None. FINDINGS: Vascular calcifications. No fracture or malalignment. No radiopaque foreign body. Diffuse soft tissue swelling. Fairly extensive gas within the  soft tissues over the dorsum and plantar aspect of the foot, centered over the second third and fourth distal digits. IMPRESSION: Edema with fairly extensive soft tissue emphysema concerning for necrotizing infection. No acute osseous abnormality Electronically Signed   By: Donavan Foil M.D.   On: 04/05/2021 18:01        Scheduled Meds:  Chlorhexidine Gluconate Cloth  6 each Topical Q0600   insulin aspart  0-9 Units Subcutaneous TID WC   insulin glargine  10 Units Subcutaneous QHS   mupirocin ointment  1 application Nasal BID   Continuous Infusions:  clindamycin (CLEOCIN) IV 600 mg (04/07/21 0500)   DAPTOmycin (CUBICIN)  IV     piperacillin-tazobactam (ZOSYN)  IV 3.375 g (04/07/21 0355)     LOS: 2 days     Cordelia Poche, MD [facility] 04/07/2021, 9:47 AM  If 7PM-7AM, please contact night-coverage [email or web address]

## 2021-04-07 NOTE — Progress Notes (Signed)
Inpatient Diabetes Program Recommendations  AACE/ADA: New Consensus Statement on Inpatient Glycemic Control (2015)  Target Ranges:  Prepandial:   less than 140 mg/dL      [place] postprandial:   less than 180 mg/dL (1-2 hours)      Critically ill patients:  140 - 180 mg/dL   Lab Results  Component Value Date   GLUCAP 247 (H) 04/07/2021   HGBA1C 8.9 (H) 01/26/2021    Review of Glycemic Control Results for Sherri Dixon, Sherri Dixon (MRN [id number]) as of 04/07/2021 08:27  Ref. Range 04/06/2021 07:49 04/06/2021 12:29 04/06/2021 16:02 04/06/2021 20:01 04/06/2021 21:23 04/06/2021 23:21 04/07/2021 07:52  Glucose-Capillary Latest Ref Range: 70 - 99 mg/dL 126 (H) 82 139 (H) 180 (H) 249 (H) 242 (H) 247 (H)   Diabetes history: DM 2 Outpatient Diabetes medications: Farxiga 10 mg prn, Basaglar 40-50 units qhs, Novolog 12-20 units tid Current orders for Inpatient glycemic control:  Novolog 0-9 units Q4 hours  Consult poorly controlled Diabetes  Glucose on presentation elevated  Last A1c resulted at 8.9% on 4/30 Note: Decadron 5 mg given yesterday  Inpatient Diabetes Program Recommendations:    Fasting glucose 247 this am - Increase Lantus to 16-18 units - obtain another A1c level  Thank,s Tama Headings RN, MSN, BC-ADM Inpatient Diabetes Coordinator Team Pager [phone] (8a-5p)

## 2021-04-07 NOTE — Progress Notes (Signed)
Pharmacy Antibiotic Note  Sherri Dixon is a 40 y.o. female admitted on 04/05/2021 with  necrotizing heel infection .  Pharmacy has been consulted for daptomycin dosing. Patient was on vancomycin and zosyn with a Scr increase from 1.49 to 1.84. Patient is currently on zosyn and clindamycin.  Plan: After discussion with Dr. Lonny Prude, antibiotics will be changed to daptomycin 1250 mg IV q24h for risk reduction of AKI.  Height: 5\' 2"  (157.5 cm) Weight: (!) 154.2 kg (340 lb) IBW/kg (Calculated) : 50.1  Temp (24hrs), Avg:98 F (36.7 C), Min:97.2 F (36.2 C), Max:98.7 F (37.1 C)  Recent Labs  Lab 04/05/21 1722 04/05/21 1839 04/06/21 0303 04/06/21 0526 04/07/21 0123  WBC 26.2*  --  22.8*  --  21.0*  CREATININE 1.49*  --   --  1.65* 1.84*  LATICACIDVEN 1.6 1.4  --   --   --     Estimated Creatinine Clearance: 57.7 mL/min (A) (by C-G formula based on SCr of 1.84 mg/dL (H)).    Allergies  Allergen Reactions   Amlodipine Swelling   Humalog Kwikpen [Insulin Lispro] Other (See Comments)    75/50---swelling and ulcers in mouth    Amoxicillin     constipation    Antimicrobials this admission: Vancomycin 7/8 >> 7/9 Clindamycin 7/8 >>  Zosyn 7/8 >>  Daptomycin 7/10 >>   Dose adjustments this admission: 7/9 vancomycin 1500 q24 >> vancomyin 1250 q24  Microbiology results: 7/8 BCx: No growth 7/9 MRSA PCR: positive  Thank you for allowing pharmacy to be a part of this patient's care.  Varney Daily, PharmD PGY1 [facility] Resident  Please check AMION for all [facility] pharmacy phone numbers. After 10:00 PM call main pharmacy [phone]

## 2021-04-07 NOTE — Progress Notes (Signed)
     Sherri Dixon is a 40 y.o. female   Orthopaedic diagnosis: PreOperative Diagnosis: Right foot necrotizing abscess  PROCEDURE: Incision and drainage right foot infection multiple bursal cavities Excisional debridement of right foot necrotizing infection Application of wound VAC    Subjective: Patient is resting comfortably.  She denies significant pain.  She says she cannot feel her middle toe.  Objectyive: Vitals:   04/07/21 0003 04/07/21 0400  BP: 110/60 119/60  Pulse: 88 87  Resp: 19 17  Temp: 98.3 F (36.8 C) 98.3 F (36.8 C)  SpO2: 99% 98%     Exam: Awake and alert Respirations even and unlabored No acute distress  Right foot with wound VAC in place.  Middle toe is white.  No sensation to middle toe.  Minimal drainage in the wound VAC.  No worsening erythema.  Assessment: Postop day 1 status post incisional drainage and debridement of right necrotizing forefoot infection with tracking abscess to the level of the calcaneal tuberosity and dorsal midfoot.   Plan: I had a lengthy conversation with the patient today.  Unfortunately she has a necrotizing infection of her forefoot and some tracking abscess cavity and necrotic tissue within the dorsal midfoot and plantar foot to the level of the calcaneus.  Her third toe is compromised currently.  At a minimum she will require transmetatarsal amputation but likely below the knee amputation given the level of necrotizing soft tissue injury.  For now we will continue the [facility] and await culture results.  She is amenable to transmetatarsal amputation but would like to avoid below the knee amputation if possible.  The level of amputation certainly will be dependent upon soft tissue coverage and possibility of recurrent infection.  Patient okay to have diet currently.  She is not septic currently.  Will update plan as she response to treatment.   Radene Journey, MD

## 2021-04-07 NOTE — Progress Notes (Signed)
Pt's urine output since 7Pm is 251ml. Has the urge to urinate but nothing coming out anymore. Bladder scan shows 658ml. NP notified and ordered to do in and out cath. Obtained 716ml amber colored urine.

## 2021-04-08 DIAGNOSIS — E1165 Type 2 diabetes mellitus with hyperglycemia: Secondary | ICD-10-CM | POA: Diagnosis not present

## 2021-04-08 DIAGNOSIS — Z794 Long term (current) use of insulin: Secondary | ICD-10-CM | POA: Diagnosis not present

## 2021-04-08 DIAGNOSIS — N179 Acute kidney failure, unspecified: Secondary | ICD-10-CM | POA: Diagnosis not present

## 2021-04-08 DIAGNOSIS — L089 Local infection of the skin and subcutaneous tissue, unspecified: Secondary | ICD-10-CM | POA: Diagnosis not present

## 2021-04-08 LAB — CBC
HCT: 24.7 % — ABNORMAL LOW (ref 36.0–46.0)
Hemoglobin: 7.8 g/dL — ABNORMAL LOW (ref 12.0–15.0)
MCH: 26.5 pg (ref 26.0–34.0)
MCHC: 31.6 g/dL (ref 30.0–36.0)
MCV: 84 fL (ref 80.0–100.0)
Platelets: 426 10*3/uL — ABNORMAL HIGH (ref 150–400)
RBC: 2.94 MIL/uL — ABNORMAL LOW (ref 3.87–5.11)
RDW: 15.6 % — ABNORMAL HIGH (ref 11.5–15.5)
WBC: 15.6 10*3/uL — ABNORMAL HIGH (ref 4.0–10.5)
nRBC: 0 % (ref 0.0–0.2)

## 2021-04-08 LAB — GLUCOSE, CAPILLARY
Glucose-Capillary: 103 mg/dL — ABNORMAL HIGH (ref 70–99)
Glucose-Capillary: 75 mg/dL (ref 70–99)
Glucose-Capillary: 97 mg/dL (ref 70–99)
Glucose-Capillary: 90 mg/dL (ref 70–99)

## 2021-04-08 LAB — BASIC METABOLIC PANEL
Anion gap: 6 (ref 5–15)
BUN: 23 mg/dL — ABNORMAL HIGH (ref 6–20)
CO2: 25 mmol/L (ref 22–32)
Calcium: 7.7 mg/dL — ABNORMAL LOW (ref 8.9–10.3)
Chloride: 105 mmol/L (ref 98–111)
Creatinine, Ser: 1.89 mg/dL — ABNORMAL HIGH (ref 0.44–1.00)
GFR, Estimated: 34 mL/min — ABNORMAL LOW (ref >60)
Glucose, Bld: 80 mg/dL (ref 70–99)
Potassium: 3.4 mmol/L — ABNORMAL LOW (ref 3.5–5.1)
Sodium: 136 mmol/L (ref 135–145)

## 2021-04-08 LAB — CK: Total CK: 22 U/L — ABNORMAL LOW (ref 38–234)

## 2021-04-08 LAB — C-REACTIVE PROTEIN: CRP: 13.9 mg/dL — ABNORMAL HIGH (ref <1.0)

## 2021-04-08 LAB — SEDIMENTATION RATE: Sed Rate: 125 mm/hr — ABNORMAL HIGH (ref 0–22)

## 2021-04-08 LAB — HEMOGLOBIN A1C
Hgb A1c MFr Bld: 8.7 % — ABNORMAL HIGH (ref 4.8–5.6)
Mean Plasma Glucose: 202.99 mg/dL

## 2021-04-08 MED ORDER — HEPARIN BOLUS VIA INFUSION
2500.0000 [IU] | Freq: Once | INTRAVENOUS | Status: AC
  Administered 2021-04-08: 2500 [IU] via INTRAVENOUS
  Filled 2021-04-08: qty 2500

## 2021-04-08 MED ORDER — POTASSIUM CHLORIDE CRYS ER 20 MEQ PO TBCR
40.0000 meq | EXTENDED_RELEASE_TABLET | Freq: Once | ORAL | Status: AC
  Administered 2021-04-08: 40 meq via ORAL
  Filled 2021-04-08: qty 2

## 2021-04-08 MED ORDER — ROSUVASTATIN CALCIUM 20 MG PO TABS: 40.0000 mg | ORAL_TABLET | Freq: Every day | ORAL | Status: DC

## 2021-04-08 MED ORDER — METOPROLOL SUCCINATE ER 100 MG PO TB24
100.0000 mg | ORAL_TABLET | Freq: Every day | ORAL | Status: DC
  Administered 2021-04-08 – 2021-04-17 (×10): 100 mg via ORAL
  Filled 2021-04-08 (×10): qty 1

## 2021-04-08 MED ORDER — HEPARIN (PORCINE) 25000 UT/250ML-% IV SOLN
1400.0000 [IU]/h | INTRAVENOUS | Status: DC
  Administered 2021-04-08 – 2021-04-09 (×3): 1500 [IU]/h via INTRAVENOUS
  Administered 2021-04-10 – 2021-04-12 (×3): 1450 [IU]/h via INTRAVENOUS
  Filled 2021-04-08 (×6): qty 250

## 2021-04-08 MED ORDER — SODIUM CHLORIDE 0.9 % IV SOLN
9.0000 mg/kg | Freq: Every day | INTRAVENOUS | Status: DC
  Administered 2021-04-08: 750 mg via INTRAVENOUS
  Filled 2021-04-08 (×2): qty 15

## 2021-04-08 MED ORDER — ONDANSETRON HCL 4 MG/2ML IJ SOLN
4.0000 mg | Freq: Four times a day (QID) | INTRAMUSCULAR | Status: DC | PRN
  Administered 2021-04-08 – 2021-04-17 (×10): 4 mg via INTRAVENOUS
  Filled 2021-04-08 (×9): qty 2

## 2021-04-08 NOTE — Progress Notes (Signed)
Patient noted to have her bed pad soak with urine since pure wick have to readjusted to suck the urine. Patient felt very relieved after voiding.  NT did a complete linen change and bed bath to patient.

## 2021-04-08 NOTE — Progress Notes (Signed)
[facility] Infectious Diseases Follow Up Note  Patient Identification: Patient Name: Sherri Dixon MRN: [id number] [place] Date: 04/05/2021  4:27 PM Age: 40 y.o.Today's Date: 04/08/2021   Reason for Visit: Osteomyelitis  Principal Problem:   Right foot infection Active Problems:   Hypokalemia   Sepsis ([place])   Diabetes ([place])   AKI (acute kidney injury) ([place])   Antibiotics: Vancomycin 7/8-7/9, daptomycin 7/10-current                    Zosyn 7/8-current                    clindamycin 7/8-current  Lines/Tubes: Wound VAC in right foot, PIV's  Interval Events: Continues to be afebrile, leukocytosis is downtrending.  Right foot abscess cultures re-ncubated for better growth   Assessment 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 OR cultures pending. Blood cx 7/8 NG 2/2 sets in 3 days   AKI on CKD IDDM Obesity  Recommendations Continue daptomycin and Zosyn as is while waiting for surgical plans ( TMA vs TTA). Agree with needing prolonged IV antibiotics if she undergoes Transmetatarsal amputation given extensive and deep infection tracking to calcaneal tuberosity  Can discontinue clindamycin after total 72 hours FU OR cultures from 7/9 Monitor CPK, CBC and BMP Baseline ESR and CRP, hba1c Follow-up vascular ABI Blood glucose control  Rest of the management as per the primary team. Thank you for the consult. Please page with pertinent questions or concerns.  ______________________________________________________________________ Subjective patient seen and examined at the bedside. Denies any complaints. Denies fevers, chills, sweats. Denies nausea, vomiting, abdominal pain and diarrhea   Vitals BP (!) 162/87 (BP Location: Left Arm)   Pulse 88   Temp 99.5 F (37.5 C) (Oral)   Resp 18   Ht 5' 2"  (1.575 m)   Wt (!) 139.2 kg    LMP 04/05/2021 (Exact Date)   SpO2 100%   BMI 56.13 kg/m     Physical Exam Constitutional:  lying in bed. No acute distress, morbidly obese     Comments:   Cardiovascular:     Rate and Rhythm: Normal rate and regular rhythm.     Heart sounds:   Pulmonary:     Effort: Pulmonary effort is normal.     Comments:   Abdominal:     Palpations: Abdomen is soft.     Tenderness: Obese, non tender and non distended   Musculoskeletal:        General: No swelling or tenderness. Rt foot is wrapped in a bandage, LLE with some oedema and skin thickening   Skin:    Comments: No obvious lesions or rashes   Neurological:     General: No focal deficit present.   Psychiatric:        Mood and Affect: Mood normal.   Pertinent Microbiology Results for orders placed or performed during the hospital encounter of 04/05/21  Resp Panel by RT-PCR (Flu A&B, Covid) Nasopharyngeal Swab     Status: None   Collection Time: 04/05/21  4:40 PM   Specimen: Nasopharyngeal Swab; Nasopharyngeal(NP) swabs in vial transport medium  Result Value Ref Range Status   SARS Coronavirus 2 by RT PCR NEGATIVE NEGATIVE Final    Comment: (NOTE) SARS-CoV-2 target nucleic acids are NOT DETECTED.  The SARS-CoV-2 RNA is generally detectable in upper respiratory specimens during the acute phase of infection. The lowest concentration of SARS-CoV-2 viral  copies this assay can detect is 138 copies/mL. A negative result does not preclude SARS-Cov-2 infection and should not be used as the sole basis for treatment or other patient management decisions. A negative result may occur with  improper specimen collection/handling, submission of specimen other than nasopharyngeal swab, presence of viral mutation(s) within the areas targeted by this assay, and inadequate number of viral copies(<138 copies/mL). A negative result must be combined with clinical observations, patient history, and epidemiological information. The expected  result is Negative.  Fact Sheet for Patients:  EntrepreneurPulse.com.au  Fact Sheet for Healthcare Providers:  IncredibleEmployment.be  This test is no t yet approved or cleared by the [place] FDA and  has been authorized for detection and/or diagnosis of SARS-CoV-2 by FDA under an Emergency Use Authorization (EUA). This EUA will remain  in effect (meaning this test can be used) for the duration of the COVID-19 declaration under Section 564(b)(1) of the Act, 21 U.S.C.section 360bbb-3(b)(1), unless the authorization is terminated  or revoked sooner.       Influenza A by PCR NEGATIVE NEGATIVE Final   Influenza B by PCR NEGATIVE NEGATIVE Final    Comment: (NOTE) The Xpert Xpress SARS-CoV-2/FLU/RSV plus assay is intended as an aid in the diagnosis of influenza from Nasopharyngeal swab specimens and should not be used as a sole basis for treatment. Nasal washings and aspirates are unacceptable for Xpert Xpress SARS-CoV-2/FLU/RSV testing.  Fact Sheet for Patients: EntrepreneurPulse.com.au  Fact Sheet for Healthcare Providers: IncredibleEmployment.be  This test is not yet approved or cleared by the [place] FDA and has been authorized for detection and/or diagnosis of SARS-CoV-2 by FDA under an Emergency Use Authorization (EUA). This EUA will remain in effect (meaning this test can be used) for the duration of the COVID-19 declaration under Section 564(b)(1) of the Act, 21 U.S.C. section 360bbb-3(b)(1), unless the authorization is terminated or revoked.  Performed at [facility], [place] [address], [place], [place] [address]   Blood Culture (routine x 2)     Status: None (Preliminary result)   Collection Time: 04/05/21  5:23 PM   Specimen: BLOOD  Result Value Ref Range Status   Specimen Description   Final    BLOOD BLOOD RIGHT ARM Performed at [facility] [address], [place], [place] [address]    Special Requests   Final    BOTTLES DRAWN AEROBIC ONLY Blood Culture adequate volume Performed at [facility] [address], [place], [place] [address]    Culture   Final    NO GROWTH 2 DAYS Performed at [facility] [address], [place], [place] [address]    Report Status PENDING  Incomplete  Blood Culture (routine x 2)     Status: None (Preliminary result)   Collection Time: 04/05/21  7:30 PM   Specimen: BLOOD  Result Value Ref Range Status   Specimen Description   Final    BLOOD RIGHT ANTECUBITAL Performed at [facility] [address], [place], [place] [address]    Special Requests   Final    BOTTLES DRAWN AEROBIC AND ANAEROBIC Blood Culture adequate volume Performed at [facility] [address], [place], [place] [address]    Culture   Final    NO GROWTH 2 DAYS Performed at [facility] [address], [place],  [address]    Report Status PENDING  Incomplete  Surgical pcr screen  Status: Abnormal   Collection Time: 04/06/21  8:55 AM   Specimen: Nasal Mucosa; Nasal Swab  Result Value Ref Range Status   MRSA, PCR NEGATIVE NEGATIVE Final   Staphylococcus aureus POSITIVE (A) NEGATIVE Final    Comment: (NOTE) The Xpert SA Assay (FDA approved for NASAL specimens in patients 56 years of age and older), is one component of a comprehensive surveillance program. It is not intended to diagnose infection nor to guide or monitor treatment. Performed at [facility], [place] [address] [place], [place] [address]   Aerobic/Anaerobic Culture w Gram Stain (surgical/deep wound)     Status: None (Preliminary result)   Collection Time: 04/06/21 11:44 AM   Specimen: Abscess  Result Value Ref Range Status   Specimen Description ABSCESS  Final   Special Requests RIGHT FOOT ABSCESS SPEC A  Final   Gram Stain   Final    NO WBC SEEN MODERATE GRAM  POSITIVE COCCI IN PAIRS AND CHAINS MODERATE GRAM POSITIVE COCCI IN CLUSTERS IN TETRADS MODERATE GRAM NEGATIVE RODS FEW GRAM POSITIVE RODS    Culture   Final    CULTURE REINCUBATED FOR BETTER GROWTH Performed at [facility], [place] [address] [place], [place] [address]    Report Status PENDING  Incomplete   Pertinent Lab. CBC Latest Ref Rng & Units 04/07/2021 04/06/2021 04/05/2021  WBC 4.0 - 10.5 K/uL 21.0(H) 22.8(H) 26.2(H)  Hemoglobin 12.0 - 15.0 g/dL 7.6(L) 7.0(L) 8.6(L)  Hematocrit 36.0 - 46.0 % 24.2(L) 21.4(L) 27.7(L)  Platelets 150 - 400 K/uL 421(H) 385 546(H)     Pertinent Imaging today Plain films and CT images have been personally visualized and interpreted; radiology reports have been reviewed. Decision making incorporated into the Impression / Recommendations.  US renal 04/07/21 FINDINGS: Right Kidney:   Renal measurements: 12.8 x 5.1 x 6.7 cm = volume: 229 mL. Echogenicity within normal limits. No mass or hydronephrosis visualized. 3.0 cm simple cyst seen in the upper pole.   Left Kidney:   Renal measurements: 11.1 x 5.6 x 6.5 cm = volume: 212 mL. Echogenicity within normal limits. No mass or hydronephrosis visualized.   Bladder:   Appears normal for degree of bladder distention.   Other:   None.   IMPRESSION: No significant sonographic abnormality of the kidneys.   Rt Foot Xray 04/05/21 FINDINGS: Vascular calcifications. No fracture or malalignment. No radiopaque foreign body. Diffuse soft tissue swelling. Fairly extensive gas within the soft tissues over the dorsum and plantar aspect of the foot, centered over the second third and fourth distal digits.  IMPRESSION: Edema with fairly extensive soft tissue emphysema concerning for necrotizing infection. No acute osseous abnormality   I spent more than 35 minutes for this patient encounter including review of prior medical records, coordination of care  with greater than 50% of time being face to  face/counseling and discussing diagnostics/treatment plan with the patient/family.  Electronically signed by:   Rosiland Oz, MD Infectious Disease Physician [facility] Pager: [phone]

## 2021-04-08 NOTE — Progress Notes (Signed)
     Sherri Dixon is a 40 y.o. female   Orthopaedic diagnosis: Orthopaedic diagnosis: PreOperative Diagnosis: Right foot necrotizing abscess   PROCEDURE: Incision and drainage right foot infection multiple bursal cavities Excisional debridement of right foot necrotizing infection Application of wound VAC    Subjective: Patient resting comfortably.  VAC has been functional.  She complains of some pain on the dorsal aspect of her foot but overall is doing well.   Objectyive: Vitals:   04/08/21 0337 04/08/21 0830  BP: (!) 162/87 (!) 170/88  Pulse: 88 75  Resp: 18 18  Temp: 99.5 F (37.5 C) 98.2 F (36.8 C)  SpO2: 100%      Exam: Awake and alert Respirations even and unlabored No acute distress  Right foot with wound VAC in place.  Third toe is white and likely not perfused.  No sensation of the third toe.  Tender to palpation of the dorsal and plantar foot.  There is some output within the canister but minimal.  Active dorsiflexion plantarflexion of the ankle.  Microbiology: Moderate gram-positive cocci in pairs Moderate gram-positive cocci in clusters and Tetrex Moderate gram-negative rods Few gram-positive rods  Assessment: Postop day 1 status post I&D of right foot with VAC placement Polymicrobial foot infection, necrotizing  Plan: Patient has a serious infection.  She had abscess tracking to the dorsal midfoot and plantar calcaneal tuberosity within the soft tissues.  She had a significant amount of necrotic tissue.  She is much against a below the knee amputation and is amenable to the transmetatarsal amputation.  I am planning to enlist the help of Dr. Sharol Given to aid with decision making.  I am concerned that a transmetatarsal amputation will not provide enough eradication of her necrotic tissue to alleviate the infection.  Based on Dr. Storm Frisk note if transmetatarsal amputation is attempted she would likely require prolonged antibiotics due to potential for  continued infection.  Will await consultation with Dr. Sharol Given for further recommendations.  Continue antibiotics and wound VAC for now.   Radene Journey, MD

## 2021-04-08 NOTE — Progress Notes (Addendum)
PROGRESS NOTE    Sherri Dixon  DTO:[id number] DOB: [birth year] DOA: 04/05/2021 PCP: Tomasa Hose, NP   Brief Narrative: Sherri Dixon is a 40 y.o. female with a history of asthma, chronic diastolic heart failure, hypertension, hyperlipidemia, diabetes mellitus on insulin, chronic microcytic anemia, GERD, history of PE on Xarelto, morbid obesity.  Patient presented secondary to right foot pain and swelling after stepping on a piece of glass about 1 week ago.  She is now found to have an infected foot concerning for necrotizing infection.  Orthopedic surgery was consulted and have taken her for irrigation and debridement with wound VAC placement.  Patient is placed on empiric antibiotics.   Assessment & Plan:   Principal Problem:   Right foot infection Active Problems:   Hypokalemia   Sepsis ([place])   Diabetes ([place])   AKI (acute kidney injury) ([place])   Sepsis Present on admission. Secondary to necrotizing foot infection. Patient started empirically on Vancomycin/Zosyn and clindamycin. Vancomycin discontinued and switched to Daptomycin. Blood cultures obtained. WBC down slightly -Follow-up blood cultures -Trend CBC -Daptomycin/Zosyn/Clindamycin  Right foot necrotizing infection Orthopedic surgery consulted for debridement which was performed on 7/9. Significant necrotic tissue mentioned in op note. Wound vac placed. Recommendation to continue antibiotics. Vancomycin discontinued secondary to worsening AKI -Orthopedic surgery recommendations: concern patient will need at least a transmetatarsal amputation but may require transtibial amputation -Continue Daptomycin/Zosyn/Clindamycin -ID consulted and is on board -ABI, if vascular compromise suggested, will touch base with vascular surgery  AKI In setting of sepsis and decreased oral intake in addition to ARB use. Patient has a history of grade 2 diastolic heart dysfunction. Worsening but BMP is pending today. Renal ultrasound  unremarkable. FENa suggests pre-renal etiology -Daily BMP -If worsening creatinine, will give a dose of Lasix  Urinary retention Now appears to have resolved. Urinalysis did not suggest UTI.  Hypokalemia Resolved with supplementation  Acute anemia In setting of Xarelto use and heavy periods. Currently on her menstrual cycle. Hemoglobin currently stable. -Trend CBC; transfuse for hemoglobin <7  Chronic diastolic dysfunction Holding lasix for now. -Watch fluid status -Daily weight/strict in/out  Hypertension -Resume home metoprolol -If still not controlled with metoprolol, resume home hydralazine -Hold olmesartan secondary to AKI  Hyperlipidemia -Continue Crestor  Diabetes mellitus, type 2 Last hemoglobin A1c of 8.9% on January 26, 2021.  Patient is on Lantus 45-50 units at bedside in addition to Novolog 12-20 units TID with meals as an outpatient. Started on SSI on admission.  Blood sugar is better controlled than expected -Continue Lantus 20 units -Continue NovoLog sliding scale insulin  History of PE History of CVA On Xarelto which has been held secondary to need for surgery. Discussed with pharmacy and recommendation to start heparin drip -Heparin drip  Primary hypertension Patient is on hydralazine and olmesartan as an outpatient. Blood pressure is labile but normal today. Antihypertensives held on admission -Continue to hold hydralazine/olmesartan   DVT prophylaxis: SCDs Start: 04/05/21 2049, heparin drip Code Status:   Code Status: Full Code Family Communication: Husband and daughter at bedside Disposition Plan: Discharge home likely in several days pending orthopedic surgery recommendations/transition to oral antibiotics   Consultants:  Orthopedic surgery Infectious disease  Procedures:  IRRIGATION AND DEBRIDEMENT WITH WOUND [place] (04/06/2021)  Antimicrobials: Vancomycin Zosyn Clindamycin    Subjective: No issues overnight. Able to urinate this  morning.  Objective: Vitals:   04/08/21 0337 04/08/21 0500 04/08/21 0830 04/08/21 1300  BP: (!) 162/87  (!) 170/88 (!) 151/80  Pulse:  88  75 84  Resp: 18  18 18   Temp: 99.5 F (37.5 C)  98.2 F (36.8 C) 97.7 F (36.5 C)  TempSrc: Oral  Oral Oral  SpO2: 100%   100%  Weight:  (!) 139.2 kg    Height:        Intake/Output Summary (Last 24 hours) at 04/08/2021 1327 Last data filed at 04/08/2021 0827 Gross per 24 hour  Intake 943.92 ml  Output 720 ml  Net 223.92 ml    Filed Weights   04/05/21 1637 04/06/21 1108 04/08/21 0500  Weight: (!) 154.2 kg (!) 154.2 kg (!) 139.2 kg    Examination:  General exam: Appears calm and comfortable Respiratory system: Clear to auscultation. Respiratory effort normal. Cardiovascular system: S1 & S2 heard, RRR. No murmurs, rubs, gallops or clicks. Gastrointestinal system: Abdomen is nondistended, soft and nontender. No organomegaly or masses felt. Decreased bowel sounds heard. Central nervous system: Alert and oriented. No focal neurological deficits. Musculoskeletal: BLE edema. No calf tenderness Skin: No cyanosis. No rashes Psychiatry: Judgement and insight appear normal. Mood & affect appropriate.     Data Reviewed: I have personally reviewed following labs and imaging studies  CBC Lab Results  Component Value Date   WBC 15.6 (H) 04/08/2021   RBC 2.94 (L) 04/08/2021   HGB 7.8 (L) 04/08/2021   HCT 24.7 (L) 04/08/2021   MCV 84.0 04/08/2021   MCH 26.5 04/08/2021   PLT 426 (H) 04/08/2021   MCHC 31.6 04/08/2021   RDW 15.6 (H) 04/08/2021   LYMPHSABS 1.6 04/05/2021   MONOABS 0.5 04/05/2021   EOSABS 0.0 04/05/2021   BASOSABS 0.0 86/[birth year]/1950     Last metabolic panel Lab Results  Component Value Date   NA 133 (L) 04/07/2021   K 3.9 04/07/2021   CL 103 04/07/2021   CO2 23 04/07/2021   BUN 23 (H) 04/07/2021   CREATININE 1.84 (H) 04/07/2021   GLUCOSE 258 (H) 04/07/2021   GFRNONAA 35 (L) 04/07/2021   GFRAA 57 (L) 07/30/2019    CALCIUM 7.7 (L) 04/07/2021   PROT 5.9 (L) 04/05/2021   ALBUMIN <1.0 (L) 04/05/2021   BILITOT 0.4 04/05/2021   ALKPHOS 266 (H) 04/05/2021   AST 16 04/05/2021   ALT 18 04/05/2021   ANIONGAP 7 04/07/2021    CBG (last 3)  Recent Labs    04/07/21 2150 04/08/21 0819 04/08/21 1256  GLUCAP 165* 103* 75      GFR: Estimated Creatinine Clearance: 53.9 mL/min (A) (by C-G formula based on SCr of 1.84 mg/dL (H)).  Coagulation Profile: Recent Labs  Lab 04/05/21 1722  INR 1.0     Recent Results (from the past 240 hour(s))  Resp Panel by RT-PCR (Flu A&B, Covid) Nasopharyngeal Swab     Status: None   Collection Time: 04/05/21  4:40 PM   Specimen: Nasopharyngeal Swab; Nasopharyngeal(NP) swabs in vial transport medium  Result Value Ref Range Status   SARS Coronavirus 2 by RT PCR NEGATIVE NEGATIVE Final    Comment: (NOTE) SARS-CoV-2 target nucleic acids are NOT DETECTED.  The SARS-CoV-2 RNA is generally detectable in upper respiratory specimens during the acute phase of infection. The lowest concentration of SARS-CoV-2 viral copies this assay can detect is 138 copies/mL. A negative result does not preclude SARS-Cov-2 infection and should not be used as the sole basis for treatment or other patient management decisions. A negative result may occur with  improper specimen collection/handling, submission of specimen other than nasopharyngeal swab, presence of viral mutation(s) within  the areas targeted by this assay, and inadequate number of viral copies(<138 copies/mL). A negative result must be combined with clinical observations, patient history, and epidemiological information. The expected result is Negative.  Fact Sheet for Patients:  EntrepreneurPulse.com.au  Fact Sheet for Healthcare Providers:  IncredibleEmployment.be  This test is no t yet approved or cleared by the [place] FDA and  has been authorized for detection and/or  diagnosis of SARS-CoV-2 by FDA under an Emergency Use Authorization (EUA). This EUA will remain  in effect (meaning this test can be used) for the duration of the COVID-19 declaration under Section 564(b)(1) of the Act, 21 U.S.C.section 360bbb-3(b)(1), unless the authorization is terminated  or revoked sooner.       Influenza A by PCR NEGATIVE NEGATIVE Final   Influenza B by PCR NEGATIVE NEGATIVE Final    Comment: (NOTE) The Xpert Xpress SARS-CoV-2/FLU/RSV plus assay is intended as an aid in the diagnosis of influenza from Nasopharyngeal swab specimens and should not be used as a sole basis for treatment. Nasal washings and aspirates are unacceptable for Xpert Xpress SARS-CoV-2/FLU/RSV testing.  Fact Sheet for Patients: EntrepreneurPulse.com.au  Fact Sheet for Healthcare Providers: IncredibleEmployment.be  This test is not yet approved or cleared by the [place] FDA and has been authorized for detection and/or diagnosis of SARS-CoV-2 by FDA under an Emergency Use Authorization (EUA). This EUA will remain in effect (meaning this test can be used) for the duration of the COVID-19 declaration under Section 564(b)(1) of the Act, 21 U.S.C. section 360bbb-3(b)(1), unless the authorization is terminated or revoked.  Performed at [facility], [place] [address], [place], [place] [address]   Blood Culture (routine x 2)     Status: None (Preliminary result)   Collection Time: 04/05/21  5:23 PM   Specimen: BLOOD  Result Value Ref Range Status   Specimen Description   Final    BLOOD BLOOD RIGHT ARM Performed at [facility] [address], [place], [place] [address]    Special Requests   Final    BOTTLES DRAWN AEROBIC ONLY Blood Culture adequate volume Performed at [facility] [address] [place], [place] [address]    Culture   Final    NO GROWTH 3 DAYS Performed at [facility], [place] [address] [place], [place] [address]    Report Status PENDING  Incomplete  Blood Culture (routine x 2)     Status: None (Preliminary result)   Collection Time: 04/05/21  7:30 PM   Specimen: BLOOD  Result Value Ref Range Status   Specimen Description   Final    BLOOD RIGHT ANTECUBITAL Performed at [facility] [address], [place], [place] [address]    Special Requests   Final    BOTTLES DRAWN AEROBIC AND ANAEROBIC Blood Culture adequate volume Performed at [facility] [address] [place], [place] [address]    Culture   Final    NO GROWTH 3 DAYS Performed at [facility], [place] [address] [place],  [address]    Report Status PENDING  Incomplete  Surgical pcr screen     Status: Abnormal   Collection Time: 04/06/21  8:55 AM   Specimen: Nasal Mucosa; Nasal Swab  Result Value Ref Range Status   MRSA, PCR NEGATIVE NEGATIVE Final   Staphylococcus aureus POSITIVE (A) NEGATIVE Final    Comment: (NOTE) The Xpert SA Assay (FDA approved for NASAL specimens in patients  27 years of age and older), is one component of a comprehensive surveillance program. It is not intended to diagnose infection nor to guide or monitor treatment. Performed at [facility], [place] [address] [place], [place] [address]   Aerobic/Anaerobic Culture w Gram Stain (surgical/deep wound)     Status: None (Preliminary result)   Collection Time: 04/06/21 11:44 AM   Specimen: Abscess  Result Value Ref Range Status   Specimen Description ABSCESS  Final   Special Requests RIGHT FOOT ABSCESS SPEC A  Final   Gram Stain   Final    NO WBC SEEN MODERATE GRAM POSITIVE COCCI IN PAIRS AND CHAINS IN TETRADS MODERATE GRAM NEGATIVE RODS FEW GRAM POSITIVE RODS Performed at [facility], [place] [address], [place], [place] [address]    Culture   Final    ABUNDANT PROTEUS MIRABILIS MODERATE STREPTOCOCCUS GALLOLYTICUS SUSCEPTIBILITIES TO  FOLLOW NO ANAEROBES ISOLATED; CULTURE IN PROGRESS FOR 5 DAYS    Report Status PENDING  Incomplete         Radiology Studies: US RENAL  Result Date: 04/07/2021 CLINICAL DATA:  AK I Urinary retention EXAM: RENAL / URINARY TRACT ULTRASOUND COMPLETE COMPARISON:  01/27/2021 FINDINGS: Right Kidney: Renal measurements: 12.8 x 5.1 x 6.7 cm = volume: 229 mL. Echogenicity within normal limits. No mass or hydronephrosis visualized. 3.0 cm simple cyst seen in the upper pole. Left Kidney: Renal measurements: 11.1 x 5.6 x 6.5 cm = volume: 212 mL. Echogenicity within normal limits. No mass or hydronephrosis visualized. Bladder: Appears normal for degree of bladder distention. Other: None. IMPRESSION: No significant sonographic abnormality of the kidneys. Electronically Signed   By: Miachel Roux M.D.   On: 04/07/2021 09:16        Scheduled Meds:  Chlorhexidine Gluconate Cloth  6 each Topical Q0600   insulin aspart  0-9 Units Subcutaneous TID WC   insulin glargine  20 Units Subcutaneous QHS   mupirocin ointment  1 application Nasal BID   Continuous Infusions:  clindamycin (CLEOCIN) IV Stopped (04/08/21 0622)   DAPTOmycin (CUBICIN)  IV Stopped (04/07/21 2336)   piperacillin-tazobactam (ZOSYN)  IV 3.375 g (04/08/21 1316)     LOS: 3 days     Cordelia Poche, MD [facility] 04/08/2021, 1:27 PM  If 7PM-7AM, please contact night-coverage [email or web address]

## 2021-04-08 NOTE — Progress Notes (Signed)
ANTICOAGULATION CONSULT NOTE - Initial Consult  Pharmacy Consult:  Heparin Indication:  History of PE   Allergies  Allergen Reactions   Amlodipine Swelling   Humalog Kwikpen [Insulin Lispro] Other (See Comments)    75/50---swelling and ulcers in mouth    Amoxicillin     constipation    Patient Measurements: Height: 5\' 2"  (157.5 cm) Weight: (!) 139.2 kg (306 lb 14.1 oz) IBW/kg (Calculated) : 50.1 Heparin Dosing Weight: 86 kg  Vital Signs: Temp: 97.7 F (36.5 C) (07/11 1300) Temp Source: Oral (07/11 1300) BP: 151/80 (07/11 1300) Pulse Rate: 84 (07/11 1300)  Labs: Recent Labs    04/05/21 1722 04/06/21 0303 04/06/21 0526 04/07/21 0123 04/07/21 1005 04/08/21 1246  HGB 8.6* 7.0*  --  7.6*  --  7.8*  HCT 27.7* 21.4*  --  24.2*  --  24.7*  PLT 546* 385  --  421*  --  426*  APTT 34  --   --   --   --   --   LABPROT 12.9  --   --   --   --   --   INR 1.0  --   --   --   --   --   CREATININE 1.49*  --  1.65* 1.84*  --   --   CKTOTAL  --   --   --   --  30*  --     Estimated Creatinine Clearance: 53.9 mL/min (A) (by C-G formula based on SCr of 1.84 mg/dL (H)).   Medical History: Past Medical History:  Diagnosis Date   Anemia    Asthma    CHF (congestive heart failure) (HCC)    Pt reports that father has CHF, not her   Diabetes mellitus without complication (HCC)    Dyslipidemia    GERD (gastroesophageal reflux disease)    Pulmonary embolism (H[facility])    Stroke [facility])      Assessment: 17 YOF presented with foot pain, swelling and drainage, found to have necrotizing infection now s/p I&D, excision and wound VAC placement on 7/9.  Noted plan for amputation.  Patient has a history of PE and recent CVA on Xarelto PTA, last dose on 04/04/21.  Pharmacy consulted to bridge with IV heparin.  Hemoglobin/hematocrit stable, platelet count elevated; no bleeding reported.  Patient was previously therapeutic on heparin 1600-1700 units/hr; however, her weight has came down per  documentation (152 in April/May > 139 kg).  Goal of Therapy:  Heparin level 0.3-0.7 units/ml aPTT 66-102 sec Monitor platelets by anticoagulation protocol: Yes   Plan:  Heparin half bolus 2500 units IV d/t anemia Heparin infusion at 1500 units/hr Check 8 hr heparin level and aPTT Daily heparin level and CBC - add daily aPTT if needed  Melana Hingle D. Mina Marble, PharmD, BCPS, [place] 04/08/2021, 1:49 PM

## 2021-04-08 NOTE — Evaluation (Signed)
Occupational Therapy Evaluation Patient Details Name: Sherri Dixon MRN: [id number] DOB: [birth year] Today's Date: 04/08/2021    History of Present Illness Sherri Dixon is a 40 y.o. female with medical history significant of asthma, chronic diastolic CHF, hypertension, hyperlipidemia, insulin-dependent diabetes melitis, chronic microcytic anemia, GERD, history of PE on Xarelto, morbid obesity (BMI 62.19), admitted for stroke in May 2022 presented to the ED with complaints of right foot pain, swelling, and drainage after she stepped on a piece of glass   Clinical Impression   Pt presents with decline in function and safety with ADLs and ADL mobility with impaired strength, balance and endurance. PTA pt lived at home with her husband and was Ind with ADLs (some assist with LB dressing recently) and mobility with no AD per pt report. Pt currently requires min guard A with increased time/effort to sit EOB, min guard A with UB ADLs, max - total A with LB selfcare, total A with toileting at bed level. Did not attempt standing/SPTs due to pt c/o dizziness seated EOB. Pt unable to latera scoot to rails and required max A with LEs back onto bed; pt used rails to pull self to [facility]. Pt would benefit from acute OT services to address impairments to maximize level of function and safety    Follow Up Recommendations  CIR    Equipment Recommendations  Other (comment);3 in 1 bedside commode;Wheelchair (measurements OT);Wheelchair cushion (measurements OT) (TBD at next venue of care, bariatric BSC)    Recommendations for Other Services Rehab consult     Precautions / Restrictions Precautions Precautions: Fall;Other (comment) Precaution Comments: Wound VAC Restrictions Weight Bearing Restrictions: Yes RLE Weight Bearing: Non weight bearing      Mobility Bed Mobility Overal bed mobility: Needs Assistance Bed Mobility: Supine to Sit;Sit to Supine     Supine to sit: Min guard Sit to supine: Max  assist   General bed mobility comments: min guard A with increased time and effort to sit EOB, pt unable/with difficulty to lateral scoot max A with LEs back onto bed. Used bed controls to assist scooting to EOB with pt using rails    Transfers                 General transfer comment: did not attempt, pt c/o of dizziness once seated EOB    Balance Overall balance assessment: Needs assistance Sitting-balance support: No upper extremity supported;Feet supported Sitting balance-Leahy Scale: Good                                     ADL either performed or assessed with clinical judgement   ADL Overall ADL's : Needs assistance/impaired Eating/Feeding: Set up;Independent;Sitting   Grooming: Wash/dry hands;Wash/dry face;Sitting;Min guard   Upper Body Bathing: Min guard;Sitting   Lower Body Bathing: Maximal assistance;Sitting/lateral leans   Upper Body Dressing : Min guard;Sitting   Lower Body Dressing: Total assistance                 General ADL Comments: unable to attempt SPTs due to dizziness     Vision Baseline Vision/History: Wears glasses Patient Visual Report: No change from baseline       Perception     Praxis      Pertinent Vitals/Pain Pain Assessment: Faces Faces Pain Scale: Hurts little more Pain Descriptors / Indicators: Shooting;Discomfort Pain Intervention(s): Premedicated before session;Repositioned;Monitored during session     Hand Dominance Right  Extremity/Trunk Assessment Upper Extremity Assessment Upper Extremity Assessment: LUE deficits/detail LUE Deficits / Details: impaird weakness 3/5 grossly (recent CVA)   Lower Extremity Assessment Lower Extremity Assessment: Defer to PT evaluation       Communication Communication Communication: No difficulties   Cognition Arousal/Alertness: Awake/alert Behavior During Therapy: WFL for tasks assessed/performed Overall Cognitive Status: Within Functional Limits for  tasks assessed                                     General Comments       Exercises     Shoulder Instructions      Home Living Family/patient expects to be discharged to:: Private residence Living Arrangements: Spouse/significant other Available Help at Discharge: Family;Available PRN/intermittently Type of Home: House Home Access: Stairs to enter [employer] of Steps: 3 Entrance Stairs-Rails: Right;Left;Can reach both Home Layout: One level     Bathroom Shower/Tub: Teacher, early years/pre: Standard     Home Equipment: None          Prior Functioning/Environment Level of Independence: Needs assistance  Gait / Transfers Assistance Needed: ambulates independently with no AD ADL's / Homemaking Assistance Needed: requires assistance LB dressing            OT Problem List: Decreased strength;Impaired balance (sitting and/or standing);Pain;Decreased activity tolerance;Decreased knowledge of use of DME or AE      OT Treatment/Interventions: Self-care/ADL training;Patient/family education;Therapeutic exercise;Balance training;Therapeutic activities    OT Goals(Current goals can be found in the care plan section) Acute Rehab OT Goals Patient Stated Goal: get stronger, get better OT Goal Formulation: With patient Time For Goal Achievement: 04/22/21 Potential to Achieve Goals: Good ADL Goals Pt Will Perform Grooming: with supervision;with set-up;sitting Pt Will Perform Upper Body Bathing: with supervision;with set-up;sitting Pt Will Perform Lower Body Bathing: with mod assist;with min assist;sitting/lateral leans Pt Will Perform Upper Body Dressing: with supervision;with set-up;sitting Pt Will Transfer to Toilet: with max assist;with mod assist;stand pivot transfer;squat pivot transfer;with transfer board  OT Frequency: Min 2X/week   Barriers to D/C:            Co-evaluation              AM-PAC OT "6 Clicks" Daily  Activity     Outcome Measure Help from another person eating meals?: None Help from another person taking care of personal grooming?: A Little Help from another person toileting, which includes using toliet, bedpan, or urinal?: Total Help from another person bathing (including washing, rinsing, drying)?: A Lot Help from another person to put on and taking off regular upper body clothing?: A Little Help from another person to put on and taking off regular lower body clothing?: Total 6 Click Score: 14   End of Session    Activity Tolerance: Patient limited by fatigue Patient left: in bed;with call bell/phone within reach  OT Visit Diagnosis: Other abnormalities of gait and mobility (R26.89);Muscle weakness (generalized) (M62.81);Pain Pain - Right/Left: Right Pain - part of body: Ankle and joints of foot                Time: 1141-1210 OT Time Calculation (min): 29 min Charges:  OT General Charges $OT Visit: 1 Visit OT Evaluation $OT Eval Moderate Complexity: 1 Mod OT Treatments $Self Care/Home Management : 8-22 mins    Britt Bottom 04/08/2021, 3:19 PM

## 2021-04-08 NOTE — Evaluation (Signed)
Physical Therapy Evaluation Patient Details Name: Sherri Dixon MRN: [id number] DOB: 1980-12-01 Today's Date: 04/08/2021   History of Present Illness  40 y.o. female with medical history significant of asthma, chronic diastolic CHF, hypertension, hyperlipidemia, insulin-dependent diabetes melitis, chronic microcytic anemia, GERD, history of PE on Xarelto, morbid obesity (BMI 62.19), admitted for stroke in May 2022 presented to the ED with complaints of right foot pain, swelling, and drainage after she stepped on a piece of glass  Clinical Impression  Pt was seen for mobility on side of bed but struggling with maintaining NWB on RLE to stand.  LLE weakness post CVA along with NWB has made her a candidate for rehab to recover the ability to slide and transfer possibly to wc or other device, and will see if she can be given permission to WB on R heel after surgery if transmet amp is the surgery that is done.  MD has not determined the level of amp yet.    Follow Up Recommendations CIR    Equipment Recommendations  None recommended by PT    Recommendations for Other Services Rehab consult     Precautions / Restrictions Precautions Precautions: Fall;Other (comment) Precaution Comments: Wound VAC Restrictions Weight Bearing Restrictions: Yes RLE Weight Bearing: Non weight bearing      Mobility  Bed Mobility Overal bed mobility: Needs Assistance Bed Mobility: Supine to Sit;Sit to Supine     Supine to sit: Min assist Sit to supine: Max assist;+2 for physical assistance;+2 for safety/equipment   General bed mobility comments: max assist to get legs back to bed    Transfers Overall transfer level: Needs assistance Equipment used: 1 person hand held assist Transfers: Sit to/from Stand;Lateral/Scoot Transfers Sit to Stand: Total assist        Lateral/Scoot Transfers: Mod assist General transfer comment: could not stand due to dizziness and low BS  Ambulation/Gait              General Gait Details: unable  Stairs            Wheelchair Mobility    Modified Rankin (Stroke Patients Only) Modified Rankin (Stroke Patients Only) Pre-Morbid Rankin Score: Moderate disability Modified Rankin: Severe disability     Balance Overall balance assessment: Needs assistance Sitting-balance support: Feet supported Sitting balance-Leahy Scale: Good                                       Pertinent Vitals/Pain Pain Assessment: Faces Faces Pain Scale: Hurts little more Pain Location: R foot in dependent posture Pain Descriptors / Indicators: Operative site guarding;Grimacing Pain Intervention(s): Limited activity within patient's tolerance;Monitored during session;Premedicated before session;Repositioned    Home Living Family/patient expects to be discharged to:: Inpatient rehab Living Arrangements: Spouse/significant other Available Help at Discharge: Family;Available PRN/intermittently Type of Home: House Home Access: Stairs to enter Entrance Stairs-Rails: Right;Left;Can reach both Entrance Stairs-Number of Steps: 3+1 Home Layout: One level Home Equipment: None      Prior Function Level of Independence: Needs assistance   Gait / Transfers Assistance Needed: ambulates independently with no AD  ADL's / Homemaking Assistance Needed: husband helps her do lower extremity dressing since stroke        Hand Dominance   Dominant Hand: Right    Extremity/Trunk Assessment   Upper Extremity Assessment Upper Extremity Assessment: Defer to OT evaluation LUE Deficits / Details: impaird weakness 3/5 grossly (recent CVA)  Lower Extremity Assessment Lower Extremity Assessment: LLE deficits/detail LLE Deficits / Details: 3- hip and 3/5 knee and ankle LLE:  (recent CVA affecting L side) LLE Coordination: decreased gross motor    Cervical / Trunk Assessment Cervical / Trunk Assessment: Normal  Communication   Communication: No  difficulties  Cognition Arousal/Alertness: Awake/alert Behavior During Therapy: WFL for tasks assessed/performed Overall Cognitive Status: Within Functional Limits for tasks assessed                                        General Comments General comments (skin integrity, edema, etc.): Pt is in an ace wrap with wound vac on R foot with NWB order.  Unable to maintain it to move on side of bed without assist from PT to keep foot off floor    Exercises     Assessment/Plan    PT Assessment Patient needs continued PT services  PT Problem List Decreased strength;Decreased range of motion;Decreased activity tolerance;Decreased balance;Decreased mobility;Decreased skin integrity;Pain;Obesity       PT Treatment Interventions DME instruction;Gait training;Functional mobility training;Therapeutic activities;Therapeutic exercise;Balance training;Neuromuscular re-education;Patient/family education    PT Goals (Current goals can be found in the Care Plan section)  Acute Rehab PT Goals Patient Stated Goal: get stronger and go home PT Goal Formulation: With patient/family Time For Goal Achievement: 04/22/21 Potential to Achieve Goals: Good    Frequency Min 5X/week   Barriers to discharge Inaccessible home environment;Decreased caregiver support home with stairs to enter and husband cannot get her up there as they are    Co-evaluation               AM-PAC PT "6 Clicks" Mobility  Outcome Measure Help needed turning from your back to your side while in a flat bed without using bedrails?: A Little Help needed moving from lying on your back to sitting on the side of a flat bed without using bedrails?: A Little Help needed moving to and from a bed to a chair (including a wheelchair)?: Total Help needed standing up from a chair using your arms (e.g., wheelchair or bedside chair)?: Total Help needed to walk in hospital room?: Total Help needed climbing 3-5 steps with a  railing? : Total 6 Click Score: 10    End of Session Equipment Utilized During Treatment: Oxygen Activity Tolerance: Patient limited by fatigue;Patient limited by pain;Treatment limited secondary to medical complications (Comment) Patient left: in bed;with call bell/phone within reach;with bed alarm set;with family/visitor present Nurse Communication: Mobility status;Weight bearing status;Precautions (asking nursing to help with her dizziness from low blood sugar possibly) PT Visit Diagnosis: Unsteadiness on feet (R26.81)    Time: 4944-9675 PT Time Calculation (min) (ACUTE ONLY): 40 min   Charges:   PT Evaluation $PT Eval Moderate Complexity: 1 Mod PT Treatments $Therapeutic Activity: 23-37 mins       Ramond Dial 04/08/2021, 5:20 PM  Mee Hives, PT MS Acute Rehab Dept. Number: [place] [facility] [place]

## 2021-04-08 NOTE — Progress Notes (Signed)
MD made awre of decreased urine output, awaiting for reply.

## 2021-04-09 ENCOUNTER — Other Ambulatory Visit: Payer: Self-pay | Admitting: Orthopaedic Surgery

## 2021-04-09 ENCOUNTER — Inpatient Hospital Stay (HOSPITAL_COMMUNITY): Payer: 59

## 2021-04-09 DIAGNOSIS — Z794 Long term (current) use of insulin: Secondary | ICD-10-CM | POA: Diagnosis not present

## 2021-04-09 DIAGNOSIS — Z9889 Other specified postprocedural states: Secondary | ICD-10-CM | POA: Diagnosis not present

## 2021-04-09 DIAGNOSIS — L089 Local infection of the skin and subcutaneous tissue, unspecified: Secondary | ICD-10-CM | POA: Diagnosis not present

## 2021-04-09 DIAGNOSIS — E1165 Type 2 diabetes mellitus with hyperglycemia: Secondary | ICD-10-CM | POA: Diagnosis not present

## 2021-04-09 DIAGNOSIS — N179 Acute kidney failure, unspecified: Secondary | ICD-10-CM | POA: Diagnosis not present

## 2021-04-09 LAB — CBC
HCT: 23.4 % — ABNORMAL LOW (ref 36.0–46.0)
Hemoglobin: 7.3 g/dL — ABNORMAL LOW (ref 12.0–15.0)
MCH: 26.4 pg (ref 26.0–34.0)
MCHC: 31.2 g/dL (ref 30.0–36.0)
MCV: 84.5 fL (ref 80.0–100.0)
Platelets: 359 10*3/uL (ref 150–400)
RBC: 2.77 MIL/uL — ABNORMAL LOW (ref 3.87–5.11)
RDW: 15.6 % — ABNORMAL HIGH (ref 11.5–15.5)
WBC: 14.1 10*3/uL — ABNORMAL HIGH (ref 4.0–10.5)
nRBC: 0 % (ref 0.0–0.2)

## 2021-04-09 LAB — BASIC METABOLIC PANEL
Anion gap: 5 (ref 5–15)
BUN: 24 mg/dL — ABNORMAL HIGH (ref 6–20)
CO2: 23 mmol/L (ref 22–32)
Calcium: 7.5 mg/dL — ABNORMAL LOW (ref 8.9–10.3)
Chloride: 106 mmol/L (ref 98–111)
Creatinine, Ser: 1.81 mg/dL — ABNORMAL HIGH (ref 0.44–1.00)
GFR, Estimated: 35 mL/min — ABNORMAL LOW (ref >60)
Glucose, Bld: 118 mg/dL — ABNORMAL HIGH (ref 70–99)
Potassium: 3.7 mmol/L (ref 3.5–5.1)
Sodium: 134 mmol/L — ABNORMAL LOW (ref 135–145)

## 2021-04-09 LAB — HEPARIN LEVEL (UNFRACTIONATED)
Heparin Unfractionated: 0.33 IU/mL (ref 0.30–0.70)
Heparin Unfractionated: 0.43 IU/mL (ref 0.30–0.70)

## 2021-04-09 LAB — GLUCOSE, CAPILLARY
Glucose-Capillary: 79 mg/dL (ref 70–99)
Glucose-Capillary: 93 mg/dL (ref 70–99)
Glucose-Capillary: 142 mg/dL — ABNORMAL HIGH (ref 70–99)
Glucose-Capillary: 160 mg/dL — ABNORMAL HIGH (ref 70–99)

## 2021-04-09 LAB — APTT: aPTT: 88 seconds — ABNORMAL HIGH (ref 24–36)

## 2021-04-09 MED ORDER — MORPHINE SULFATE (PF) 2 MG/ML IV SOLN
2.0000 mg | INTRAVENOUS | Status: DC | PRN
  Administered 2021-04-12 – 2021-04-14 (×6): 2 mg via INTRAVENOUS
  Filled 2021-04-09 (×6): qty 1

## 2021-04-09 MED ORDER — MORPHINE SULFATE (PF) 2 MG/ML IV SOLN: 1.0000 mg | INTRAVENOUS | Status: DC | PRN

## 2021-04-09 MED ORDER — HYDRALAZINE HCL 50 MG PO TABS
50.0000 mg | ORAL_TABLET | Freq: Three times a day (TID) | ORAL | Status: DC
  Administered 2021-04-09 – 2021-04-17 (×23): 50 mg via ORAL
  Filled 2021-04-09 (×23): qty 1

## 2021-04-09 MED ORDER — OXYCODONE HCL 5 MG PO TABS
5.0000 mg | ORAL_TABLET | ORAL | Status: DC | PRN
  Administered 2021-04-09 – 2021-04-17 (×19): 10 mg via ORAL
  Filled 2021-04-09 (×20): qty 2

## 2021-04-09 MED ORDER — INSULIN GLARGINE 100 UNIT/ML ~~LOC~~ SOLN
10.0000 [IU] | Freq: Every day | SUBCUTANEOUS | Status: DC
  Administered 2021-04-09: 10 [IU] via SUBCUTANEOUS
  Filled 2021-04-09 (×2): qty 0.1

## 2021-04-09 NOTE — Progress Notes (Addendum)
PROGRESS NOTE    Sherri Dixon  NWG:[id number] DOB: 10-23-1980 DOA: 04/05/2021 PCP: Tomasa Hose, NP   Brief Narrative: Sherri Dixon is a 40 y.o. female with a history of asthma, chronic diastolic heart failure, hypertension, hyperlipidemia, diabetes mellitus on insulin, chronic microcytic anemia, GERD, history of PE on Xarelto, morbid obesity.  Patient presented secondary to right foot pain and swelling after stepping on a piece of glass about 1 week ago.  She is now found to have an infected foot concerning for necrotizing infection.  Orthopedic surgery was consulted and have taken her for irrigation and debridement with wound VAC placement.  Patient is placed on empiric antibiotics.   Assessment & Plan:   Principal Problem:   Right foot infection Active Problems:   Hypokalemia   Sepsis ([place])   Diabetes ([place])   AKI (acute kidney injury) ([place])   Sepsis Present on admission. Secondary to necrotizing foot infection. Patient started empirically on Vancomycin/Zosyn and clindamycin. Vancomycin discontinued and switched to Daptomycin. Blood cultures obtained. WBC down. -Follow-up blood cultures -Trend CBC -Daptomycin/Zosyn/Clindamycin  Right foot necrotizing infection Orthopedic surgery consulted for debridement which was performed on 7/9. Significant necrotic tissue mentioned in op note. Wound vac placed. Recommendation to continue antibiotics. Vancomycin discontinued secondary to worsening AKI. Wound culture significant for streptococcus gallolyticus/proteus mirabilis  -Orthopedic surgery recommendations: concern patient will need at least a transmetatarsal amputation but may require transtibial amputation -Continue Daptomycin/Zosyn/Clindamycin -ID consulted and is on board -ABI, if vascular compromise suggested, will touch base with vascular surgery  AKI In setting of sepsis and decreased oral intake in addition to ARB use. Patient has a history of grade 2 diastolic heart  dysfunction. Renal ultrasound unremarkable. FENa suggests pre-renal etiology. Creatinine starting to trend down. -Daily BMP  Urinary retention Now appears to have resolved. Urinalysis did not suggest UTI.  Hypokalemia Resolved with supplementation  Acute anemia In setting of Xarelto use and heavy periods. Currently on her menstrual cycle. Hemoglobin currently stable. -Trend CBC; transfuse for hemoglobin <7 -Watch hemoglobin while on heparin drip  Chronic diastolic dysfunction Holding lasix for now. -Watch fluid status -Daily weight/strict in/out  Hyperlipidemia -Continue Crestor  Diabetes mellitus, type 2 Last hemoglobin A1c of 8.9% on January 26, 2021.  Patient is on Lantus 45-50 units at bedside in addition to Novolog 12-20 units TID with meals as an outpatient. Started on SSI on admission.  Blood sugar is better controlled. -Decrease to Lantus 10 units -Continue NovoLog sliding scale insulin  History of PE History of CVA On Xarelto which has been held secondary to need for surgery. Discussed with pharmacy and recommendation to start heparin drip -Heparin drip  Primary hypertension Patient is on hydralazine and olmesartan as an outpatient. Blood pressure is uncontrolled.Antihypertensives held on admission. -Continue home metoprolol -Restart home hydralazine -Hold olmesartan secondary to AKI   DVT prophylaxis: SCDs Start: 04/05/21 2049, heparin drip Code Status:   Code Status: Full Code Family Communication: Aunt at bedside Disposition Plan: Discharge inpatient rehabilitation likely in several days pending orthopedic surgery recommendations/transition to oral antibiotics per ID   Consultants:  Orthopedic surgery Infectious disease  Procedures:  IRRIGATION AND DEBRIDEMENT WITH WOUND VAC PLACEMENT (04/06/2021)  Antimicrobials: Vancomycin Zosyn Clindamycin    Subjective: Some foot pain. No other concerns  Objective: Vitals:   04/09/21 0023 04/09/21 0451  04/09/21 0500 04/09/21 0911  BP: (!) 157/85  (!) 156/85 (!) 170/85  Pulse: 73  76 82  Resp: 18  18 19   Temp: 98.5 F (36.9  C)  98.5 F (36.9 C) 98.7 F (37.1 C)  TempSrc: Oral  Oral Oral  SpO2: 100%  100% 100%  Weight:  (!) 139 kg    Height:        Intake/Output Summary (Last 24 hours) at 04/09/2021 1331 Last data filed at 04/09/2021 0526 Gross per 24 hour  Intake 457.76 ml  Output 1050 ml  Net -592.24 ml    Filed Weights   04/06/21 1108 04/08/21 0500 04/09/21 0451  Weight: (!) 154.2 kg (!) 139.2 kg (!) 139 kg    Examination:  General exam: Appears calm and comfortable Respiratory system: Clear to auscultation. Respiratory effort normal. Cardiovascular system: S1 & S2 heard, RRR. No murmurs, rubs, gallops or clicks. Gastrointestinal system: Abdomen is nondistended, soft and nontender. No organomegaly or masses felt. Normal bowel sounds heard. Central nervous system: Alert and oriented. No focal neurological deficits. Musculoskeletal: BLE edema. No calf tenderness. Right foot in surgical dressing. Skin: No cyanosis. No rashes Psychiatry: Judgement and insight appear normal. Mood & affect appropriate.     Data Reviewed: I have personally reviewed following labs and imaging studies  CBC Lab Results  Component Value Date   WBC 14.1 (H) 04/09/2021   RBC 2.77 (L) 04/09/2021   HGB 7.3 (L) 04/09/2021   HCT 23.4 (L) 04/09/2021   MCV 84.5 04/09/2021   MCH 26.4 04/09/2021   PLT 359 04/09/2021   MCHC 31.2 04/09/2021   RDW 15.6 (H) 04/09/2021   LYMPHSABS 1.6 04/05/2021   MONOABS 0.5 04/05/2021   EOSABS 0.0 04/05/2021   BASOSABS 0.0 68/34/1962     Last metabolic panel Lab Results  Component Value Date   NA 134 (L) 04/09/2021   K 3.7 04/09/2021   CL 106 04/09/2021   CO2 23 04/09/2021   BUN 24 (H) 04/09/2021   CREATININE 1.81 (H) 04/09/2021   GLUCOSE 118 (H) 04/09/2021   GFRNONAA 35 (L) 04/09/2021   GFRAA 57 (L) 07/30/2019   CALCIUM 7.5 (L) 04/09/2021   PROT  5.9 (L) 04/05/2021   ALBUMIN <1.0 (L) 04/05/2021   BILITOT 0.4 04/05/2021   ALKPHOS 266 (H) 04/05/2021   AST 16 04/05/2021   ALT 18 04/05/2021   ANIONGAP 5 04/09/2021    CBG (last 3)  Recent Labs    04/08/21 2029 04/09/21 0912 04/09/21 1242  GLUCAP 90 79 93      GFR: Estimated Creatinine Clearance: 54.8 mL/min (A) (by C-G formula based on SCr of 1.81 mg/dL (H)).  Coagulation Profile: Recent Labs  Lab 04/05/21 1722  INR 1.0     Recent Results (from the past 240 hour(s))  Resp Panel by RT-PCR (Flu A&B, Covid) Nasopharyngeal Swab     Status: None   Collection Time: 04/05/21  4:40 PM   Specimen: Nasopharyngeal Swab; Nasopharyngeal(NP) swabs in vial transport medium  Result Value Ref Range Status   SARS Coronavirus 2 by RT PCR NEGATIVE NEGATIVE Final    Comment: (NOTE) SARS-CoV-2 target nucleic acids are NOT DETECTED.  The SARS-CoV-2 RNA is generally detectable in upper respiratory specimens during the acute phase of infection. The lowest concentration of SARS-CoV-2 viral copies this assay can detect is 138 copies/mL. A negative result does not preclude SARS-Cov-2 infection and should not be used as the sole basis for treatment or other patient management decisions. A negative result may occur with  improper specimen collection/handling, submission of specimen other than nasopharyngeal swab, presence of viral mutation(s) within the areas targeted by this assay, and inadequate number of viral  copies(<138 copies/mL). A negative result must be combined with clinical observations, patient history, and epidemiological information. The expected result is Negative.  Fact Sheet for Patients:  EntrepreneurPulse.com.au  Fact Sheet for Healthcare Providers:  IncredibleEmployment.be  This test is no t yet approved or cleared by the [place] FDA and  has been authorized for detection and/or diagnosis of SARS-CoV-2 by FDA under an  Emergency Use Authorization (EUA). This EUA will remain  in effect (meaning this test can be used) for the duration of the COVID-19 declaration under Section 564(b)(1) of the Act, 21 U.S.C.section 360bbb-3(b)(1), unless the authorization is terminated  or revoked sooner.       Influenza A by PCR NEGATIVE NEGATIVE Final   Influenza B by PCR NEGATIVE NEGATIVE Final    Comment: (NOTE) The Xpert Xpress SARS-CoV-2/FLU/RSV plus assay is intended as an aid in the diagnosis of influenza from Nasopharyngeal swab specimens and should not be used as a sole basis for treatment. Nasal washings and aspirates are unacceptable for Xpert Xpress SARS-CoV-2/FLU/RSV testing.  Fact Sheet for Patients: EntrepreneurPulse.com.au  Fact Sheet for Healthcare Providers: IncredibleEmployment.be  This test is not yet approved or cleared by the [place] FDA and has been authorized for detection and/or diagnosis of SARS-CoV-2 by FDA under an Emergency Use Authorization (EUA). This EUA will remain in effect (meaning this test can be used) for the duration of the COVID-19 declaration under Section 564(b)(1) of the Act, 21 U.S.C. section 360bbb-3(b)(1), unless the authorization is terminated or revoked.  Performed at [facility], [place] [address] [place], [place] [address]   Blood Culture (routine x 2)     Status: None (Preliminary result)   Collection Time: 04/05/21  5:23 PM   Specimen: BLOOD  Result Value Ref Range Status   Specimen Description   Final    BLOOD BLOOD RIGHT ARM Performed at [facility] [address], [place], [place] [address]    Special Requests   Final    BOTTLES DRAWN AEROBIC ONLY Blood Culture adequate volume Performed at [facility] [address], [place], [place] [address]    Culture   Final    NO GROWTH 4 DAYS Performed at [facility], [place] [address], [place],  [place] [address]    Report Status PENDING  Incomplete  Blood Culture (routine x 2)     Status: None (Preliminary result)   Collection Time: 04/05/21  7:30 PM   Specimen: BLOOD  Result Value Ref Range Status   Specimen Description   Final    BLOOD RIGHT ANTECUBITAL Performed at [facility] [address] [place], [place] [address]    Special Requests   Final    BOTTLES DRAWN AEROBIC AND ANAEROBIC Blood Culture adequate volume Performed at [facility] [address] [place], [place] [address]    Culture   Final    NO GROWTH 4 DAYS Performed at [facility], [place] [address], [place],  [address]    Report Status PENDING  Incomplete  Surgical pcr screen     Status: Abnormal   Collection Time: 04/06/21  8:55 AM   Specimen: Nasal Mucosa; Nasal Swab  Result Value Ref Range Status   MRSA, PCR NEGATIVE NEGATIVE Final   Staphylococcus aureus POSITIVE (A) NEGATIVE Final    Comment: (NOTE) The Xpert SA Assay (FDA approved for NASAL specimens in patients 32 years of age and older), is one component of a  comprehensive surveillance program. It is not intended to diagnose infection nor to guide or monitor treatment. Performed at [facility], [place] [address], [place], [place] [address]   Aerobic/Anaerobic Culture w Gram Stain (surgical/deep wound)     Status: None (Preliminary result)   Collection Time: 04/06/21 11:44 AM   Specimen: Abscess  Result Value Ref Range Status   Specimen Description ABSCESS  Final   Special Requests RIGHT FOOT ABSCESS SPEC A  Final   Gram Stain   Final    NO WBC SEEN MODERATE GRAM POSITIVE COCCI IN PAIRS AND CHAINS IN TETRADS MODERATE GRAM NEGATIVE RODS FEW GRAM POSITIVE RODS Performed at [facility], [place] [address], [place], [place] [address]    Culture   Final    ABUNDANT PROTEUS MIRABILIS MODERATE STREPTOCOCCUS GALLOLYTICUS NO ANAEROBES ISOLATED; CULTURE IN PROGRESS FOR 5 DAYS    Report  Status PENDING  Incomplete   Organism ID, Bacteria PROTEUS MIRABILIS  Final   Organism ID, Bacteria STREPTOCOCCUS GALLOLYTICUS  Final      Susceptibility   Proteus mirabilis - MIC*    AMPICILLIN <=2 SENSITIVE Sensitive     CEFAZOLIN 8 SENSITIVE Sensitive     CEFEPIME <=0.12 SENSITIVE Sensitive     CEFTAZIDIME <=1 SENSITIVE Sensitive     CEFTRIAXONE <=0.25 SENSITIVE Sensitive     CIPROFLOXACIN <=0.25 SENSITIVE Sensitive     GENTAMICIN <=1 SENSITIVE Sensitive     IMIPENEM 4 SENSITIVE Sensitive     TRIMETH/SULFA <=20 SENSITIVE Sensitive     AMPICILLIN/SULBACTAM <=2 SENSITIVE Sensitive     PIP/TAZO <=4 SENSITIVE Sensitive     * ABUNDANT PROTEUS MIRABILIS   Streptococcus gallolyticus - MIC*    PENICILLIN 0.12 SENSITIVE Sensitive     CEFTRIAXONE 0.25 SENSITIVE Sensitive     ERYTHROMYCIN >=8 RESISTANT Resistant     LEVOFLOXACIN 4 INTERMEDIATE Intermediate     VANCOMYCIN 0.25 SENSITIVE Sensitive     * MODERATE STREPTOCOCCUS GALLOLYTICUS         Radiology Studies: No results found.      Scheduled Meds:  Chlorhexidine Gluconate Cloth  6 each Topical Q0600   insulin aspart  0-9 Units Subcutaneous TID WC   insulin glargine  20 Units Subcutaneous QHS   metoprolol succinate  100 mg Oral Daily   mupirocin ointment  1 application Nasal BID   Continuous Infusions:  DAPTOmycin (CUBICIN)  IV Stopped (04/08/21 2127)   heparin 1,500 Units/hr (04/09/21 0526)   piperacillin-tazobactam (ZOSYN)  IV 3.375 g (04/09/21 1200)     LOS: 4 days     Cordelia Poche, MD [facility] 04/09/2021, 1:31 PM  If 7PM-7AM, please contact night-coverage [email or web address]

## 2021-04-09 NOTE — Plan of Care (Signed)
  Problem: Education: Goal: Knowledge of General Education information will improve Description Including pain rating scale, medication(s)/side effects and non-pharmacologic comfort measures Outcome: Progressing   

## 2021-04-09 NOTE — Progress Notes (Signed)
ANTICOAGULATION CONSULT NOTE  Pharmacy Consult:  Heparin Indication:  History of PE   Allergies  Allergen Reactions   Amlodipine Swelling   Humalog Kwikpen [Insulin Lispro] Other (See Comments)    75/50---swelling and ulcers in mouth    Amoxicillin     constipation    Patient Measurements: Height: 5\' 2"  (157.5 cm) Weight: (!) 139 kg (306 lb 7 oz) IBW/kg (Calculated) : 50.1 Heparin Dosing Weight: 86 kg  Vital Signs: Temp: 98.7 F (37.1 C) (07/12 0911) Temp Source: Oral (07/12 0911) BP: 170/85 (07/12 0911) Pulse Rate: 82 (07/12 0911)  Labs: Recent Labs    04/07/21 0123 04/07/21 1005 04/08/21 1246 04/09/21 0056 04/09/21 1212  HGB 7.6*  --  7.8* 7.3*  --   HCT 24.2*  --  24.7* 23.4*  --   PLT 421*  --  426* 359  --   APTT  --   --   --  88*  --   HEPARINUNFRC  --   --   --  0.33 0.43  CREATININE 1.84*  --  1.89* 1.81*  --   CKTOTAL  --  30* 22*  --   --      Estimated Creatinine Clearance: 54.8 mL/min (A) (by C-G formula based on SCr of 1.81 mg/dL (H)).   Medical History: Past Medical History:  Diagnosis Date   Anemia    Asthma    CHF (congestive heart failure) (HCC)    Pt reports that father has CHF, not her   Diabetes mellitus without complication (HCC)    Dyslipidemia    GERD (gastroesophageal reflux disease)    Pulmonary embolism (HCC)    Stroke [facility])      Assessment: 25 YOF presented with foot pain, swelling and drainage, found to have necrotizing infection now s/p I&D, excision and wound VAC placement on 7/9.  Noted plan for amputation.  Patient has a history of PE and recent CVA on Xarelto PTA, last dose on 04/04/21.  Pharmacy consulted to bridge with IV heparin.    Heparin level is therapeutic.  Hemoglobin/hematocrit stable, platelet count WNL; no bleeding reported.  Goal of Therapy:  Heparin level 0.3-0.7 units/ml, aim for low goal with anemia Monitor platelets by anticoagulation protocol: Yes   Plan:  Continue heparin infusion at 1500  units/hr Daily heparin level and CBC  Darly Massi D. Mina Marble, PharmD, BCPS, [place] 04/09/2021, 1:41 PM

## 2021-04-09 NOTE — Progress Notes (Signed)
Inpatient Diabetes Program Recommendations  AACE/ADA: New Consensus Statement on Inpatient Glycemic Control (2015)  Target Ranges:  Prepandial:   less than 140 mg/dL      Peak postprandial:   less than 180 mg/dL (1-2 hours)      Critically ill patients:  140 - 180 mg/dL   Lab Results  Component Value Date   GLUCAP 93 04/09/2021   HGBA1C 8.7 (H) 04/08/2021    Review of Glycemic Control Results for Sherri Dixon, Sherri Dixon (MRN [id number]) as of 04/09/2021 13:37  Ref. Range 04/08/2021 08:19 04/08/2021 12:56 04/08/2021 18:20 04/08/2021 20:29 04/09/2021 09:12 04/09/2021 12:42  Glucose-Capillary Latest Ref Range: 70 - 99 mg/dL 103 (H) 75 97 90 79 93   Diabetes history: DM 2 Outpatient Diabetes medications: Farxiga 10 mg prn, Basaglar 40-50 units qhs, Novolog 12-20 units tid Current orders for Inpatient glycemic control:Lantus 20 units q hs +Novolog 0-9 units Q4 hours  Inpatient Diabetes Program Recommendations:    Lantus 20 given once post steroids on 04-07-21. CBGs 75-93 without insulin given. -D/C Lantus and review CBGs. Secure chat sent to Dr. Lonny Prude.  Thank you, Nani Gasser. Anamaria Dusenbury, RN, MSN, CDE  Diabetes Coordinator Inpatient Glycemic Control Team Team Pager [phone] (8am-5pm) 04/09/2021 1:39 PM

## 2021-04-09 NOTE — Progress Notes (Signed)
ABI has been completed.   Preliminary results in CV Proc.   Abram Sander 04/09/2021 5:11 PM

## 2021-04-09 NOTE — Progress Notes (Signed)
ANTICOAGULATION CONSULT NOTE  Pharmacy Consult:  Heparin Indication:  History of PE   Labs: Recent Labs    04/06/21 0526 04/07/21 0123 04/07/21 1005 04/08/21 1246 04/09/21 0056  HGB  --  7.6*  --  7.8* 7.3*  HCT  --  24.2*  --  24.7* 23.4*  PLT  --  421*  --  426* 359  APTT  --   --   --   --  88*  HEPARINUNFRC  --   --   --   --  0.33  CREATININE 1.65* 1.84*  --  1.89*  --   CKTOTAL  --   --  30* 22*  --     Assessment: 71 YOF presented with foot pain, swelling and drainage, found to have necrotizing infection now s/p I&D, excision and wound VAC placement on 7/9.  Noted plan for amputation.  Patient has a history of PE and recent CVA on Xarelto PTA, last dose on 04/04/21.  Pharmacy consulted to bridge with IV heparin.  Hemoglobin/hematocrit stable, platelet count elevated; no bleeding reported.  Heparin level 0.33 units/ml, aPTT 88 sec  Goal of Therapy:  Heparin level 0.3-0.7 units/ml aPTT 66-102 sec Monitor platelets by anticoagulation protocol: Yes   Plan:  Continue heparin infusion at 1500 units/hr Check heparin level later today to confirm Daily heparin level and CBC  Thanks for allowing pharmacy to be a part of this patient's care.  Excell Seltzer, PharmD Clinical Pharmacist  04/09/2021, 1:50 AM

## 2021-04-09 NOTE — Progress Notes (Signed)
Physical Therapy Treatment Patient Details Name: Sherri Dixon MRN: [id number] DOB: September 05, 1981 Today's Date: 04/09/2021    History of Present Illness 40 y.o. female with medical history significant of asthma, chronic diastolic CHF, hypertension, hyperlipidemia, insulin-dependent diabetes melitis, chronic microcytic anemia, GERD, history of PE on Xarelto, morbid obesity (BMI 62.19), admitted for stroke in May 2022 presented to the ED with complaints of right foot pain, swelling, and drainage after she stepped on a piece of glass    PT Comments    Pt is very anxious this session.  Required step by step cues to participate and feel comfortable during session.  Pt would benefit from slide board transfer training next session.  Continue to recommend aggressive CIR.     Follow Up Recommendations  CIR     Equipment Recommendations  None recommended by PT    Recommendations for Other Services Rehab consult     Precautions / Restrictions Precautions Precautions: Fall;Other (comment) Precaution Comments: Wound VAC Restrictions Weight Bearing Restrictions: Yes RLE Weight Bearing: Non weight bearing    Mobility  Bed Mobility Overal bed mobility: Needs Assistance Bed Mobility: Supine to Sit     Supine to sit: Min guard     General bed mobility comments: Min guard to rise into sitting.  Increased time and effort noted.    Transfers Overall transfer level: Needs assistance Equipment used:  (lateral scoot with bed pad.) Transfers: Lateral/Scoot Transfers          Lateral/Scoot Transfers: Mod assist;+2 physical assistance General transfer comment: PTA and tech helped pull pad while patient shifted weight forward and scooted laterally to drop arm chair from her bed.  Ambulation/Gait                 Stairs             Wheelchair Mobility    Modified Rankin (Stroke Patients Only)       Balance Overall balance assessment: Needs assistance Sitting-balance  support: Feet supported Sitting balance-Leahy Scale: Good       Standing balance-Leahy Scale: Poor                              Cognition Arousal/Alertness: Awake/alert Behavior During Therapy: WFL for tasks assessed/performed Overall Cognitive Status: Within Functional Limits for tasks assessed                                        Exercises General Exercises - Lower Extremity Long Arc Quad: AROM;Both;10 reps;Seated Hip Flexion/Marching: AROM;Both;10 reps;Seated Other Exercises Other Exercises: B shoulder flexion ( LUE deficits from CVA) x 10 reps. Other Exercises: B chest pressed x 10 reps seated. Other Exercises: B shoulder shrugs x 10 reps seated.    General Comments        Pertinent Vitals/Pain Pain Assessment: Faces Faces Pain Scale: Hurts little more Pain Location: R foot in dependent posture Pain Descriptors / Indicators: Operative site guarding;Grimacing Pain Intervention(s): Monitored during session;Repositioned    Home Living                      Prior Function            PT Goals (current goals can now be found in the care plan section) Acute Rehab PT Goals Patient Stated Goal: get stronger and go home Potential to Achieve Goals:  Good Progress towards PT goals: Progressing toward goals    Frequency    Min 5X/week      PT Plan Current plan remains appropriate    Co-evaluation              AM-PAC PT "6 Clicks" Mobility   Outcome Measure  Help needed turning from your back to your side while in a flat bed without using bedrails?: A Little Help needed moving from lying on your back to sitting on the side of a flat bed without using bedrails?: A Little Help needed moving to and from a bed to a chair (including a wheelchair)?: A Lot Help needed standing up from a chair using your arms (e.g., wheelchair or bedside chair)?: Total Help needed to walk in hospital room?: Total Help needed climbing 3-5  steps with a railing? : Total 6 Click Score: 11    End of Session   Activity Tolerance: Patient tolerated treatment well Patient left: in chair;with call bell/phone within reach Nurse Communication: Mobility status PT Visit Diagnosis: Unsteadiness on feet (R26.81)     Time: 1115-5208 PT Time Calculation (min) (ACUTE ONLY): 16 min  Charges:  $Therapeutic Activity: 8-22 mins                     Erasmo Leventhal , PTA Acute Rehabilitation Services Pager [phone] Office [phone]    Sherri Dixon 04/09/2021, 5:44 PM

## 2021-04-09 NOTE — Progress Notes (Signed)
Result Notes  Component 3 d ago   Specimen Description ABSCESS   Special Requests RIGHT FOOT ABSCESS SPEC A   Gram Stain NO WBC SEEN  MODERATE GRAM POSITIVE COCCI IN PAIRS AND CHAINS IN TETRADS  MODERATE GRAM NEGATIVE RODS  FEW GRAM POSITIVE RODS  Performed at [facility], [place] [address], [place], [place] [address]   Culture ABUNDANT PROTEUS MIRABILIS  MODERATE STREPTOCOCCUS GALLOLYTICUS  NO ANAEROBES ISOLATED; CULTURE IN PROGRESS FOR 5 DAYS   Report Status PENDING   Organism ID, Bacteria PROTEUS MIRABILIS   Organism ID, Bacteria STREPTOCOCCUS GALLOLYTICUS   Resulting Agency [facility]      Susceptibility   Proteus mirabilis Streptococcus gallolyticus    MIC MIC    AMPICILLIN <=2 SENSITIVE  Sensitive      AMPICILLIN/SULBACTAM <=2 SENSITIVE  Sensitive      CEFAZOLIN 8 SENSITIVE  Sensitive      CEFEPIME <=0.12 SENS... Sensitive      CEFTAZIDIME <=1 SENSITIVE  Sensitive      CEFTRIAXONE <=0.25 SENS... Sensitive 0.25 SENSIT... Sensitive    CIPROFLOXACIN <=0.25 SENS... Sensitive      ERYTHROMYCIN   >=8 RESISTANT  Resistant    GENTAMICIN <=1 SENSITIVE  Sensitive      IMIPENEM 4 SENSITIVE  Sensitive      LEVOFLOXACIN   4 INTERMEDI... Intermediate    PENICILLIN   0.12 SENSIT... Sensitive    PIP/TAZO <=4 SENSITIVE  Sensitive      TRIMETH/SULFA <=20 SENSIT... Sensitive      VANCOMYCIN   0.25 SENSIT... Sensitive                    Recommendations  DC Daptomycin Continue Zosyn for now while waiting for surgical plans Monitor CBC and BMP   Rosiland Oz, MD Infectious Disease Physician [facility] [address] [place], [place] [address] Phone: [phone]  Fax: [phone]

## 2021-04-09 NOTE — Progress Notes (Signed)
Rehab Admissions Coordinator Note:  Patient was screened by Cleatrice Burke for appropriateness for an Inpatient Acute Rehab Consult per therapy recs.   At this time, we are recommending Inpatient Rehab consult. I will place order per protocol.  Cleatrice Burke RN MSN 04/09/2021, 9:56 AM  I can be reached at [phone].

## 2021-04-09 NOTE — Progress Notes (Addendum)
Inpatient Rehabilitation Admissions Coordinator   Inpatient rehab consult received. I met with patient at bedside for assessment. I discussed goals and expectations of a possible CIR admit. She prefers Cir . I will contact her spouse to further discuss. I will begin insurance Auth pending  Dr Sharol Given to consult.  Danne Baxter, RN, MSN Rehab Admissions Coordinator [phone] 04/09/2021 12:51 PM

## 2021-04-10 DIAGNOSIS — M7989 Other specified soft tissue disorders: Secondary | ICD-10-CM

## 2021-04-10 DIAGNOSIS — E876 Hypokalemia: Secondary | ICD-10-CM

## 2021-04-10 DIAGNOSIS — L089 Local infection of the skin and subcutaneous tissue, unspecified: Secondary | ICD-10-CM | POA: Diagnosis not present

## 2021-04-10 DIAGNOSIS — A419 Sepsis, unspecified organism: Principal | ICD-10-CM

## 2021-04-10 DIAGNOSIS — N179 Acute kidney failure, unspecified: Secondary | ICD-10-CM | POA: Diagnosis not present

## 2021-04-10 LAB — HEPARIN LEVEL (UNFRACTIONATED): Heparin Unfractionated: 0.5 IU/mL (ref 0.30–0.70)

## 2021-04-10 LAB — BASIC METABOLIC PANEL
Anion gap: 7 (ref 5–15)
BUN: 21 mg/dL — ABNORMAL HIGH (ref 6–20)
CO2: 22 mmol/L (ref 22–32)
Calcium: 7.3 mg/dL — ABNORMAL LOW (ref 8.9–10.3)
Chloride: 107 mmol/L (ref 98–111)
Creatinine, Ser: 1.64 mg/dL — ABNORMAL HIGH (ref 0.44–1.00)
GFR, Estimated: 40 mL/min — ABNORMAL LOW (ref >60)
Glucose, Bld: 146 mg/dL — ABNORMAL HIGH (ref 70–99)
Potassium: 3.7 mmol/L (ref 3.5–5.1)
Sodium: 136 mmol/L (ref 135–145)

## 2021-04-10 LAB — CULTURE, BLOOD (ROUTINE X 2)
Culture: NO GROWTH
Culture: NO GROWTH
Special Requests: ADEQUATE
Special Requests: ADEQUATE

## 2021-04-10 LAB — CBC
HCT: 20.1 % — ABNORMAL LOW (ref 36.0–46.0)
Hemoglobin: 6.3 g/dL — CL (ref 12.0–15.0)
MCH: 26.8 pg (ref 26.0–34.0)
MCHC: 31.3 g/dL (ref 30.0–36.0)
MCV: 85.5 fL (ref 80.0–100.0)
Platelets: 284 10*3/uL (ref 150–400)
RBC: 2.35 MIL/uL — ABNORMAL LOW (ref 3.87–5.11)
RDW: 15.8 % — ABNORMAL HIGH (ref 11.5–15.5)
WBC: 18.4 10*3/uL — ABNORMAL HIGH (ref 4.0–10.5)
nRBC: 0 % (ref 0.0–0.2)

## 2021-04-10 LAB — GLUCOSE, CAPILLARY
Glucose-Capillary: 76 mg/dL (ref 70–99)
Glucose-Capillary: 80 mg/dL (ref 70–99)
Glucose-Capillary: 83 mg/dL (ref 70–99)
Glucose-Capillary: 110 mg/dL — ABNORMAL HIGH (ref 70–99)

## 2021-04-10 LAB — HEMOGLOBIN AND HEMATOCRIT, BLOOD
HCT: 34.6 % — ABNORMAL LOW (ref 36.0–46.0)
Hemoglobin: 11.2 g/dL — ABNORMAL LOW (ref 12.0–15.0)

## 2021-04-10 LAB — PREPARE RBC (CROSSMATCH)

## 2021-04-10 MED ORDER — SODIUM CHLORIDE 0.9% IV SOLUTION: Freq: Once | INTRAVENOUS | Status: AC

## 2021-04-10 MED ORDER — PANTOPRAZOLE SODIUM 40 MG PO TBEC
40.0000 mg | DELAYED_RELEASE_TABLET | Freq: Two times a day (BID) | ORAL | Status: DC
  Administered 2021-04-10 – 2021-04-11 (×3): 40 mg via ORAL
  Filled 2021-04-10 (×4): qty 1

## 2021-04-10 MED ORDER — POLYETHYLENE GLYCOL 3350 17 G PO PACK
17.0000 g | PACK | Freq: Two times a day (BID) | ORAL | Status: AC
  Administered 2021-04-11: 17 g via ORAL
  Filled 2021-04-10 (×2): qty 1

## 2021-04-10 MED ORDER — INSULIN GLARGINE 100 UNIT/ML ~~LOC~~ SOLN
5.0000 [IU] | Freq: Two times a day (BID) | SUBCUTANEOUS | Status: DC
  Administered 2021-04-10: 5 [IU] via SUBCUTANEOUS
  Filled 2021-04-10 (×3): qty 0.05

## 2021-04-10 NOTE — Progress Notes (Signed)
ANTICOAGULATION & ANTIBIOTIC CONSULT NOTE  Pharmacy Consult:  Heparin + Zosyn  Indication:  History of PE + Necrotizing wound infection  Allergies  Allergen Reactions   Amlodipine Swelling   Humalog Kwikpen [Insulin Lispro] Other (See Comments)    75/50---swelling and ulcers in mouth    Amoxicillin     constipation    Patient Measurements: Height: 5\' 2"  (157.5 cm) Weight: (!) 139 kg (306 lb 7 oz) IBW/kg (Calculated) : 50.1 Heparin Dosing Weight: 86 kg  Vital Signs: Temp: 98.7 F (37.1 C) (07/13 0753) Temp Source: Oral (07/13 0753) BP: 161/80 (07/13 0753) Pulse Rate: 77 (07/13 0753)  Labs: Recent Labs    04/07/21 1005 04/08/21 1246 04/08/21 1246 04/09/21 0056 04/09/21 1212 04/10/21 0243  HGB  --  7.8*   < > 7.3*  --  6.3*  HCT  --  24.7*  --  23.4*  --  20.1*  PLT  --  426*  --  359  --  284  APTT  --   --   --  88*  --   --   HEPARINUNFRC  --   --   --  0.33 0.43 0.50  CREATININE  --  1.89*  --  1.81*  --  1.64*  CKTOTAL 30* 22*  --   --   --   --    < > = values in this interval not displayed.     Estimated Creatinine Clearance: 60.5 mL/min (A) (by C-G formula based on SCr of 1.64 mg/dL (H)).   Assessment: 29 YOF presented with foot pain, swelling and drainage, found to have necrotizing infection now s/p I&D, excision and wound VAC placement on 7/9.  Noted plan for amputation.  Pharmacy consulted to dose Zosyn.  Renal function improving, afebrile, WBC trended up to 18.4, drain output 199mL.  Vanc 7/8 >> 7/9 Cubicin 7/10 >> 7/12 Zosyn 7/9 >> Clinda 7/8 >> 7/11   7/10 CK = 30 7/11 CK = 22   7/8 BCx - negative 7/9 MRSA PCR - positive 7/9 R foot abscess, spec A - Proteus (pan sensitive), Strep gallolyticus (S PCN, CTX)   Patient has a history of PE and recent CVA on Xarelto PTA, last dose on 04/04/21.  Pharmacy also consulted to bridge with IV heparin.  Heparin level therapeutic.  Hemoglobin decreased to 6.3 g/dL, but no overt bleeding per RN.  Patient  is on her menstrual cycle.  Goal of Therapy:  Resolution of infection Heparin level 0.3-0.7 units/ml, aim for low goal with anemia Monitor platelets by anticoagulation protocol: Yes   Plan:  Continue Zosyn EID 3.375gm IV Q8H D/C weekly CK ID plans to narrow after definitive surgical plans Pharmacy will sign off of Zosyn dosing and follow peripherally.  Thank you for the consult!  Reduce heparin infusion slightly to 1450 units/hr Daily heparin level and CBC  Jahmai Finelli D. Mina Marble, PharmD, BCPS, [place] 04/10/2021, 9:21 AM

## 2021-04-10 NOTE — Progress Notes (Signed)
Physical Therapy Treatment Patient Details Name: Sherri Dixon MRN: [id number] DOB: 1981-03-13 Today's Date: 04/10/2021    History of Present Illness 40 y.o. female with medical history significant of asthma, chronic diastolic CHF, hypertension, hyperlipidemia, insulin-dependent diabetes melitis, chronic microcytic anemia, GERD, history of PE on Xarelto, morbid obesity (BMI 62.19), admitted for stroke in May 2022 presented to the ED with complaints of right foot pain, swelling, and drainage after she stepped on a piece of glass    PT Comments    Pt supine in bed on arrival this session.  Pt reports," I feel tired."  Required max cues for encouragement this session.  Pt did report she got up to bedside commode but was not maintaining weight bearing in [place] stedy with nursing staff.  Focused on slide board transfer to recliner.  Pt very fatigued after moving across board and had difficulty scooting posterior into recliner this pm.     Follow Up Recommendations  CIR     Equipment Recommendations  None recommended by PT    Recommendations for Other Services Rehab consult     Precautions / Restrictions Precautions Precautions: Fall;Other (comment) Precaution Comments: Wound VAC Restrictions Weight Bearing Restrictions: Yes RLE Weight Bearing: Non weight bearing    Mobility  Bed Mobility Overal bed mobility: Needs Assistance Bed Mobility: Supine to Sit;Rolling Rolling: Max assist;Mod assist   Supine to sit: Min guard     General bed mobility comments: Min guard to rise into sitting.  Increased time and effort noted.  Rolling pre transfer to edge of bed proved to be more difficult.  Mod assistance to roll to the R side and max to roll partially to the L side. Performed rolling to place bed pad under patient to prepare for slide board transfer.    Transfers Overall transfer level: Needs assistance Equipment used: Sliding board Transfers: Lateral/Scoot Transfers           Lateral/Scoot Transfers: With slide board;+2 physical assistance;Mod assist General transfer comment: Mod assistance +2 to move pad across slide board .  Pt utilized arms and move from high to low surface with gravity assisting.  She did have difficulty scooting posterior into recliner.  Pt required total assist to place board but patient able to remove it from under her person once in recliner.  Ambulation/Gait Ambulation/Gait assistance:  (NT)               Stairs             Wheelchair Mobility    Modified Rankin (Stroke Patients Only)       Balance Overall balance assessment: Needs assistance Sitting-balance support: Feet supported Sitting balance-Leahy Scale: Good       Standing balance-Leahy Scale: Poor                              Cognition Arousal/Alertness: Awake/alert Behavior During Therapy: WFL for tasks assessed/performed Overall Cognitive Status: Within Functional Limits for tasks assessed                                        Exercises      General Comments        Pertinent Vitals/Pain Pain Assessment: Faces Faces Pain Scale: Hurts little more Pain Location: R foot in dependent posture Pain Descriptors / Indicators: Operative site guarding;Grimacing Pain Intervention(s): Monitored during session;Repositioned  Home Living                      Prior Function            PT Goals (current goals can now be found in the care plan section) Acute Rehab PT Goals Patient Stated Goal: get stronger and go home Potential to Achieve Goals: Good Progress towards PT goals: Progressing toward goals    Frequency    Min 5X/week      PT Plan Current plan remains appropriate    Co-evaluation              AM-PAC PT "6 Clicks" Mobility   Outcome Measure  Help needed turning from your back to your side while in a flat bed without using bedrails?: A Little Help needed moving from lying on your  back to sitting on the side of a flat bed without using bedrails?: A Little Help needed moving to and from a bed to a chair (including a wheelchair)?: A Lot Help needed standing up from a chair using your arms (e.g., wheelchair or bedside chair)?: Total Help needed to walk in hospital room?: Total Help needed climbing 3-5 steps with a railing? : Total 6 Click Score: 11    End of Session   Activity Tolerance: Patient limited by fatigue (Pt not very motivated to participate this session.  reports feeling," tired.") Patient left: with call bell/phone within reach;in chair;with chair alarm set Nurse Communication: Mobility status;Need for lift equipment (maximove sling placed under patient's bottom) PT Visit Diagnosis: Unsteadiness on feet (R26.81)     Time: 1452-1510 PT Time Calculation (min) (ACUTE ONLY): 18 min  Charges:  $Therapeutic Activity: 8-22 mins                     Erasmo Leventhal , PTA Acute Rehabilitation Services Pager [phone] Office [phone]    Shadeed Colberg [name] 04/10/2021, 3:45 PM

## 2021-04-10 NOTE — Progress Notes (Signed)
     Sherri Dixon is a 40 y.o. female   Orthopaedic diagnosis:status post I&D of right foot with necrotizing soft tissue infection  Subjective: Patient is resting comfortably.  She states the pain is improving.  She is tolerating the VAC well and working well with therapy.  Objectyive: Vitals:   04/10/21 1029 04/10/21 1034  BP: (!) 183/83 (!) 179/88  Pulse: 76 76  Resp: 18 20  Temp: 98.2 F (36.8 C) 98.1 F (36.7 C)  SpO2: 100% 100%     Exam: Awake and alert Respirations even and unlabored No acute distress  Right foot with functional back.  Third toe is white without perfusion.  No sensation of the third toe.  Vac is putting out small amounts of serosanguineous type material.  Assessment: Right forefoot necrotizing soft tissue infection with a compromised toes   Plan: I had a lengthy conversation with the patient and her husband today.  We will proceed with repeat irrigation and debridement and likely amputation of some sort this Friday afternoon.  She has been posted for surgery.  We will make nothing by mouth past midnight on Thursday night.  Continue antibiotics for now.  She will at least require amputation of the middle toe but may require transmetatarsal amputation based on response to antibiotics and VAC so far.  She understands that if we proceed with transmetatarsal amputation or just a toe amputation that that would not necessarily eradicate the infection and she would likely require prolonged antibiotic therapy to eradicate the infection and may end up needing further surgery or a higher level of amputation if this is not able to be completed with antibiotics.  Despite these risks they are willing to proceed.  Plan for surgery on Friday.   Radene Journey, MD

## 2021-04-10 NOTE — Progress Notes (Signed)
Pt feels cold, 3 blanket on, temp oral 99.2

## 2021-04-10 NOTE — Progress Notes (Signed)
[facility] PROGRESS NOTE    Progress Note  Sherri Dixon  RJJ:[id number] DOB: 1980-10-08 DOA: 04/05/2021 PCP: Tomasa Hose, NP     Brief Narrative:   Sherri Dixon is an 40 y.o. female past medical history of asthma, chronic diastolic heart failure, essential hypertension diabetes mellitus insulin-dependent, microcytic anemia history of PE on Xarelto comes in for right foot pain and swelling after stepping on a piece of glass about a week ago.  Was found to have a necrotic infected foot orthopedic surgery was consulted she was taken to the OR with irrigation and debridement and wound VAC placement.    Antibiotics: 04/05/2021 IV Zosyn 04/05/2021 IV clindamycin 04/07/2021 IV daptomycin  Microbiology data: Blood culture: Negative till date  Procedures: None  Assessment/Plan:   Sepsis due to necrotizing Right foot infection I&D performed on 04/06/2021.  With significant necrotizing tissue and op note wound VAC in place. IV vancomycin was discontinued due to acute kidney injury. Culture grew Streptococcus gallolyticus and Proteus  Orthopedic surgery is concerned that she will need at least a transmetatarsal amputation and worst-case scenario transtibial amputation.  Orthopedic surgeon will enlist Dr. Sharol Given to help make decision on amputation. ID on board appreciate assistance.  Acute kidney injury: In the setting of sepsis and ARB use.  With a baseline creatinine of 1.1-1.3 Creatinine trending down with fluid hydration.  Urinary retention: Appears to be resolved.  Hypokalemia: Replete orally now resolved.  Microcytic anemia: In a menstruating female with heavy periods on Xarelto.  She is now on IV heparin. This morning was 6.3 being transfused 2 units of packed red blood cells she is CBC posttransfusion all.  Chronic diastolic heart failure: Holding Lasix volume status is appears euvolemic on physical exam.  Hyperlipidemia: Sign continue Crestor.  Diabetes  mellitus type 2: With an A1c of 8.9, she is on 10 of Lantus plus sliding scale insulin blood glucose fairly controlled continue monitor closely.  History of PE/history of CVA: Currently on a heparin drip, Xarelto has been held awaiting for orthopedic surgery recommendations to switch to a DOAC.  Essential hypertension: ARB discontinued due to acute kidney injury. Continue metoprolol and hydralazine.   DVT prophylaxis: heparin Family Communication:husband Status is: Inpatient  Remains inpatient appropriate because:Hemodynamically unstable  Dispo: The patient is from: Home              Anticipated d/c is to: Home              Patient currently is not medically stable to d/c.   Difficult to place patient No        Code Status:     Code Status Orders  (From admission, onward)           Start     Ordered   04/05/21 2049  Full code  Continuous        04/05/21 2051           Code Status History     Date Active Date Inactive Code Status Order ID Comments User Context   01/25/2021 1948 01/28/2021 1939 Full Code [id number]  Rise Patience, MD ED   12/12/2020 2345 12/15/2020 1717 Full Code [id number]  Rise Patience, MD ED   07/27/2019 1646 07/31/2019 2132 Full Code [id number]  Cleon Gustin, MD Inpatient   03/10/2019 1030 03/11/2019 1559 Full Code [id number]  Domenic Polite, MD ED   05/22/2018 1455 05/24/2018 1917 Full Code [id number]  Debbe Odea, MD ED   06/05/2016 1613 06/06/2016 1540  Full Code [id number]  Caren Griffins, MD ED         IV Access:   Peripheral IV   Procedures and diagnostic studies:   VAS [place] ABI WITH/WO TBI  Result Date: 04/09/2021  LOWER EXTREMITY DOPPLER STUDY Patient Name:  Sherri Dixon  Date of Exam:   04/09/2021 Medical Rec #: [id number]      Accession #:    [id number] Date of Birth: [birth year]       Patient Gender: F Patient Age:   41Y Exam Location:  [facility] Procedure:      VAS [place] ABI WITH/WO TBI Referring Phys: [id number]  RALPH A NETTEY --------------------------------------------------------------------------------  Indications: Post op. High Risk Factors: Diabetes, prior CVA.  Performing Technologist: Archie Patten RVS  Examination Guidelines: A complete evaluation includes at minimum, Doppler waveform signals and systolic blood pressure reading at the level of bilateral brachial, anterior tibial, and posterior tibial arteries, when vessel segments are accessible. Bilateral testing is considered an integral part of a complete examination. Photoelectric Plethysmograph (PPG) waveforms and toe systolic pressure readings are included as required and additional duplex testing as needed. Limited examinations for reoccurring indications may be performed as noted.  ABI Findings: +--------+------------------+-----+---------+--------+ Right   Rt Pressure (mmHg)IndexWaveform Comment  +--------+------------------+-----+---------+--------+ [id number]                    triphasic         +--------+------------------+-----+---------+--------+ PTA     188               0.91 biphasic          +--------+------------------+-----+---------+--------+ DP      225               1.09 biphasic          +--------+------------------+-----+---------+--------+ +--------+------------------+-----+---------+-------+ Left    Lt Pressure (mmHg)IndexWaveform Comment +--------+------------------+-----+---------+-------+ [id number]                    triphasic        +--------+------------------+-----+---------+-------+ PTA     255               1.24 triphasic        +--------+------------------+-----+---------+-------+ DP      233               1.13 triphasic        +--------+------------------+-----+---------+-------+ +-------+-----------+-----------+------------+------------+ ABI/TBIToday's ABIToday's TBIPrevious ABIPrevious TBI +-------+-----------+-----------+------------+------------+ Right  1.09                                            +-------+-----------+-----------+------------+------------+ Left   1.24                                           +-------+-----------+-----------+------------+------------+  Summary: Right: Resting right ankle-brachial index is within normal range. No evidence of significant right lower extremity arterial disease. Left: Resting left ankle-brachial index is within normal range. No evidence of significant left lower extremity arterial disease. ABIs are unreliable.  *See table(s) above for measurements and observations.     Preliminary      Medical Consultants:   None.   Subjective:    [name] pain is controlled.  Objective:    Vitals:   04/10/21 0012 04/10/21  0445 04/10/21 0753 04/10/21 1009  BP: (!) 130/58 137/65 (!) 161/80 (!) 168/98  Pulse: 77 74 77 76  Resp: 17 17 16 18   Temp: 98.7 F (37.1 C) 98.7 F (37.1 C) 98.7 F (37.1 C) 98.3 F (36.8 C)  TempSrc: Oral Oral Oral Tympanic  SpO2: 100% 98% 99% 100%  Weight:      Height:       SpO2: 100 %   Intake/Output Summary (Last 24 hours) at 04/10/2021 1029 Last data filed at 04/10/2021 0953 Gross per 24 hour  Intake 543 ml  Output 1600 ml  Net -1057 ml   Filed Weights   04/06/21 1108 04/08/21 0500 04/09/21 0451  Weight: (!) 154.2 kg (!) 139.2 kg (!) 139 kg    Exam: General exam: In no acute distress. Respiratory system: Good air movement and clear to auscultation. Cardiovascular system: S1 & S2 heard, RRR. No JVD. Gastrointestinal system: Abdomen is nondistended, soft and nontender.  Extremities: Left foot wrapped with a wound VAC in place. Skin: No rashes, lesions or ulcers Psychiatry: Judgement and insight appear normal. Mood & affect appropriate.    Data Reviewed:    Labs: Basic Metabolic Panel: Recent Labs  Lab 04/06/21 0526 04/07/21 0123 04/08/21 1246 04/09/21 0056 04/10/21 0243  NA 135 133* 136 134* 136  K 3.3* 3.9 3.4* 3.7 3.7  CL 104 103 105 106  107  CO2 25 23 25 23 22   GLUCOSE 144* 258* 80 118* 146*  BUN 19 23* 23* 24* 21*  CREATININE 1.65* 1.84* 1.89* 1.81* 1.64*  CALCIUM 7.5* 7.7* 7.7* 7.5* 7.3*  MG 1.8  --   --   --   --    GFR Estimated Creatinine Clearance: 60.5 mL/min (A) (by C-G formula based on SCr of 1.64 mg/dL (H)). Liver Function Tests: Recent Labs  Lab 04/05/21 1722  AST 16  ALT 18  ALKPHOS 266*  BILITOT 0.4  PROT 5.9*  ALBUMIN <1.0*   No results for input(s): LIPASE, AMYLASE in the last 168 hours. No results for input(s): AMMONIA in the last 168 hours. Coagulation profile Recent Labs  Lab 04/05/21 1722  INR 1.0   COVID-19 Labs  Recent Labs    04/08/21 1246  CRP 13.9*    Lab Results  Component Value Date   SARSCOV2NAA NEGATIVE 04/05/2021   [place] NEGATIVE 01/25/2021   [place] NEGATIVE 12/12/2020   SARSCOV2NAA NOT DETECTED 07/25/2019    CBC: Recent Labs  Lab 04/05/21 1722 04/06/21 0303 04/07/21 0123 04/08/21 1246 04/09/21 0056 04/10/21 0243  WBC 26.2* 22.8* 21.0* 15.6* 14.1* 18.4*  NEUTROABS 24.1*  --   --   --   --   --   HGB 8.6* 7.0* 7.6* 7.8* 7.3* 6.3*  HCT 27.7* 21.4* 24.2* 24.7* 23.4* 20.1*  MCV 86.3 84.3 84.0 84.0 84.5 85.5  PLT 546* 385 421* 426* 359 284   Cardiac Enzymes: Recent Labs  Lab 04/07/21 1005 04/08/21 1246  CKTOTAL 30* 22*   BNP (last 3 results) No results for input(s): PROBNP in the last 8760 hours. CBG: Recent Labs  Lab 04/09/21 0912 04/09/21 1242 04/09/21 1750 04/09/21 2122 04/10/21 0750  GLUCAP 79 93 142* 160* 76   D-Dimer: No results for input(s): DDIMER in the last 72 hours. Hgb A1c: Recent Labs    04/08/21 1246  HGBA1C 8.7*   Lipid Profile: No results for input(s): CHOL, HDL, LDLCALC, TRIG, CHOLHDL, LDLDIRECT in the last 72 hours. Thyroid function studies: No results for input(s): TSH, T4TOTAL, T3FREE,  THYROIDAB in the last 72 hours.  Invalid input(s): FREET3 Anemia work up: No results for input(s): VITAMINB12,  FOLATE, FERRITIN, TIBC, IRON, RETICCTPCT in the last 72 hours. Sepsis Labs: Recent Labs  Lab 04/05/21 1722 04/05/21 1839 04/06/21 0303 04/07/21 0123 04/08/21 1246 04/09/21 0056 04/10/21 0243  WBC 26.2*  --    < > 21.0* 15.6* 14.1* 18.4*  LATICACIDVEN 1.6 1.4  --   --   --   --   --    < > = values in this interval not displayed.   Microbiology Recent Results (from the past 240 hour(s))  Resp Panel by RT-PCR (Flu A&B, Covid) Nasopharyngeal Swab     Status: None   Collection Time: 04/05/21  4:40 PM   Specimen: Nasopharyngeal Swab; Nasopharyngeal(NP) swabs in vial transport medium  Result Value Ref Range Status   SARS Coronavirus 2 by RT PCR NEGATIVE NEGATIVE Final    Comment: (NOTE) SARS-CoV-2 target nucleic acids are NOT DETECTED.  The SARS-CoV-2 RNA is generally detectable in upper respiratory specimens during the acute phase of infection. The lowest concentration of SARS-CoV-2 viral copies this assay can detect is 138 copies/mL. A negative result does not preclude SARS-Cov-2 infection and should not be used as the sole basis for treatment or other patient management decisions. A negative result may occur with  improper specimen collection/handling, submission of specimen other than nasopharyngeal swab, presence of viral mutation(s) within the areas targeted by this assay, and inadequate number of viral copies(<138 copies/mL). A negative result must be combined with clinical observations, patient history, and epidemiological information. The expected result is Negative.  Fact Sheet for Patients:  EntrepreneurPulse.com.au  Fact Sheet for Healthcare Providers:  IncredibleEmployment.be  This test is no t yet approved or cleared by the [place] FDA and  has been authorized for detection and/or diagnosis of SARS-CoV-2 by FDA under an Emergency Use Authorization (EUA). This EUA will remain  in effect (meaning this test can be used) for  the duration of the COVID-19 declaration under Section 564(b)(1) of the Act, 21 U.S.C.section 360bbb-3(b)(1), unless the authorization is terminated  or revoked sooner.       Influenza A by PCR NEGATIVE NEGATIVE Final   Influenza B by PCR NEGATIVE NEGATIVE Final    Comment: (NOTE) The Xpert Xpress SARS-CoV-2/FLU/RSV plus assay is intended as an aid in the diagnosis of influenza from Nasopharyngeal swab specimens and should not be used as a sole basis for treatment. Nasal washings and aspirates are unacceptable for Xpert Xpress SARS-CoV-2/FLU/RSV testing.  Fact Sheet for Patients: EntrepreneurPulse.com.au  Fact Sheet for Healthcare Providers: IncredibleEmployment.be  This test is not yet approved or cleared by the [place] FDA and has been authorized for detection and/or diagnosis of SARS-CoV-2 by FDA under an Emergency Use Authorization (EUA). This EUA will remain in effect (meaning this test can be used) for the duration of the COVID-19 declaration under Section 564(b)(1) of the Act, 21 U.S.C. section 360bbb-3(b)(1), unless the authorization is terminated or revoked.  Performed at [facility], [place] [address] [place], [place] [address]   Blood Culture (routine x 2)     Status: None   Collection Time: 04/05/21  5:23 PM   Specimen: BLOOD  Result Value Ref Range Status   Specimen Description   Final    BLOOD BLOOD RIGHT ARM Performed at [place] [address] [place], [place] [address]    Special Requests   Final    BOTTLES DRAWN AEROBIC  ONLY Blood Culture adequate volume Performed at [place] [address] [place], [place] [address]    Culture   Final    NO GROWTH 5 DAYS Performed at [facility], [place] [address] [place], [place] [address]    Report Status 04/10/2021 FINAL  Final  Blood Culture (routine x 2)     Status: None   Collection Time:  04/05/21  7:30 PM   Specimen: BLOOD  Result Value Ref Range Status   Specimen Description   Final    BLOOD RIGHT ANTECUBITAL Performed at [place] [address], [place], [place] [address]    Special Requests   Final    BOTTLES DRAWN AEROBIC AND ANAEROBIC Blood Culture adequate volume Performed at [place] [address] [place], [place] [address]    Culture   Final    NO GROWTH 5 DAYS Performed at [facility], [place] [address] [place], [place] [address]    Report Status 04/10/2021 FINAL  Final  Surgical pcr screen     Status: Abnormal   Collection Time: 04/06/21  8:55 AM   Specimen: Nasal Mucosa; Nasal Swab  Result Value Ref Range Status   MRSA, PCR NEGATIVE NEGATIVE Final   Staphylococcus aureus POSITIVE (A) NEGATIVE Final    Comment: (NOTE) The Xpert SA Assay (FDA approved for NASAL specimens in patients 30 years of age and older), is one component of a comprehensive surveillance program. It is not intended to diagnose infection nor to guide or monitor treatment. Performed at [facility], [place] [address] [place], [place] [address]   Aerobic/Anaerobic Culture w Gram Stain (surgical/deep wound)     Status: None (Preliminary result)   Collection Time: 04/06/21 11:44 AM   Specimen: Abscess  Result Value Ref Range Status   Specimen Description ABSCESS  Final   Special Requests RIGHT FOOT ABSCESS SPEC A  Final   Gram Stain   Final    NO WBC SEEN MODERATE GRAM POSITIVE COCCI IN PAIRS AND CHAINS IN TETRADS MODERATE GRAM NEGATIVE RODS FEW GRAM POSITIVE RODS Performed at [facility], [place] [address], [place], [place] [address]    Culture   Final    ABUNDANT PROTEUS MIRABILIS MODERATE STREPTOCOCCUS GALLOLYTICUS NO ANAEROBES ISOLATED; CULTURE IN PROGRESS FOR 5 DAYS    Report Status PENDING  Incomplete   Organism ID, Bacteria PROTEUS MIRABILIS  Final   Organism ID, Bacteria STREPTOCOCCUS GALLOLYTICUS  Final       Susceptibility   Proteus mirabilis - MIC*    AMPICILLIN <=2 SENSITIVE Sensitive     CEFAZOLIN 8 SENSITIVE Sensitive     CEFEPIME <=0.12 SENSITIVE Sensitive     CEFTAZIDIME <=1 SENSITIVE Sensitive     CEFTRIAXONE <=0.25 SENSITIVE Sensitive     CIPROFLOXACIN <=0.25 SENSITIVE Sensitive     GENTAMICIN <=1 SENSITIVE Sensitive     IMIPENEM 4 SENSITIVE Sensitive     TRIMETH/SULFA <=20 SENSITIVE Sensitive     AMPICILLIN/SULBACTAM <=2 SENSITIVE Sensitive     PIP/TAZO <=4 SENSITIVE Sensitive     * ABUNDANT PROTEUS MIRABILIS   Streptococcus gallolyticus - MIC*    PENICILLIN 0.12 SENSITIVE Sensitive     CEFTRIAXONE 0.25 SENSITIVE Sensitive     ERYTHROMYCIN >=8 RESISTANT Resistant     LEVOFLOXACIN 4 INTERMEDIATE Intermediate     VANCOMYCIN 0.25 SENSITIVE Sensitive     * MODERATE STREPTOCOCCUS GALLOLYTICUS     Medications:    sodium chloride  Intravenous Once   Chlorhexidine Gluconate Cloth  6 each Topical Q0600   hydrALAZINE  50 mg Oral TID   insulin aspart  0-9 Units Subcutaneous TID WC   insulin glargine  10 Units Subcutaneous QHS   metoprolol succinate  100 mg Oral Daily   mupirocin ointment  1 application Nasal BID   Continuous Infusions:  heparin 1,450 Units/hr (04/10/21 0937)   piperacillin-tazobactam (ZOSYN)  IV 12.5 mL/hr at 04/10/21 0626      LOS: 5 days   Charlynne Cousins  [facility]  04/10/2021, 10:29 AM

## 2021-04-10 NOTE — Progress Notes (Signed)
Inpatient Diabetes Program Recommendations  AACE/ADA: New Consensus Statement on Inpatient Glycemic Control (2015)  Target Ranges:  Prepandial:   less than 140 mg/dL      Peak postprandial:   less than 180 mg/dL (1-2 hours)      Critically ill patients:  140 - 180 mg/dL   Lab Results  Component Value Date   GLUCAP 80 04/10/2021   HGBA1C 8.7 (H) 04/08/2021    Review of Glycemic Control Results for CHARLIENE, INOUE (MRN [id number]) as of 04/10/2021 16:34  Ref. Range 04/09/2021 12:42 04/09/2021 17:50 04/09/2021 21:22 04/10/2021 07:50 04/10/2021 12:05  Glucose-Capillary Latest Ref Range: 70 - 99 mg/dL 93 142 (H) 160 (H) 76 80   Inpatient Diabetes Program Recommendations:   Please consider D/C L[place] Secure chat sent to Dr. Aileen Fass.  Thank you, Nani Gasser. Dorthea Maina, RN, MSN, CDE  Diabetes Coordinator Inpatient Glycemic Control Team Team Pager [phone] (8am-5pm) 04/10/2021 4:35 PM

## 2021-04-11 ENCOUNTER — Ambulatory Visit: Payer: 59 | Admitting: Podiatry

## 2021-04-11 DIAGNOSIS — E876 Hypokalemia: Secondary | ICD-10-CM | POA: Diagnosis not present

## 2021-04-11 DIAGNOSIS — N179 Acute kidney failure, unspecified: Secondary | ICD-10-CM | POA: Diagnosis not present

## 2021-04-11 DIAGNOSIS — M7989 Other specified soft tissue disorders: Secondary | ICD-10-CM | POA: Diagnosis not present

## 2021-04-11 DIAGNOSIS — L089 Local infection of the skin and subcutaneous tissue, unspecified: Secondary | ICD-10-CM | POA: Diagnosis not present

## 2021-04-11 LAB — AEROBIC/ANAEROBIC CULTURE W GRAM STAIN (SURGICAL/DEEP WOUND): Gram Stain: NONE SEEN

## 2021-04-11 LAB — CBC
HCT: 23.5 % — ABNORMAL LOW (ref 36.0–46.0)
Hemoglobin: 7.4 g/dL — ABNORMAL LOW (ref 12.0–15.0)
MCH: 27 pg (ref 26.0–34.0)
MCHC: 31.5 g/dL (ref 30.0–36.0)
MCV: 85.8 fL (ref 80.0–100.0)
Platelets: 289 10*3/uL (ref 150–400)
RBC: 2.74 MIL/uL — ABNORMAL LOW (ref 3.87–5.11)
RDW: 15.4 % (ref 11.5–15.5)
WBC: 22.8 10*3/uL — ABNORMAL HIGH (ref 4.0–10.5)
nRBC: 0.1 % (ref 0.0–0.2)

## 2021-04-11 LAB — BASIC METABOLIC PANEL
Anion gap: 7 (ref 5–15)
BUN: 19 mg/dL (ref 6–20)
CO2: 22 mmol/L (ref 22–32)
Calcium: 7.1 mg/dL — ABNORMAL LOW (ref 8.9–10.3)
Chloride: 105 mmol/L (ref 98–111)
Creatinine, Ser: 1.43 mg/dL — ABNORMAL HIGH (ref 0.44–1.00)
GFR, Estimated: 47 mL/min — ABNORMAL LOW (ref >60)
Glucose, Bld: 92 mg/dL (ref 70–99)
Potassium: 3.8 mmol/L (ref 3.5–5.1)
Sodium: 134 mmol/L — ABNORMAL LOW (ref 135–145)

## 2021-04-11 LAB — TYPE AND SCREEN
ABO/RH(D): A POS
Antibody Screen: NEGATIVE
Unit division: 0

## 2021-04-11 LAB — BPAM RBC
Blood Product Expiration Date: 202207252359
ISSUE DATE / TIME: 202207130954
Unit Type and Rh: 6200

## 2021-04-11 LAB — GLUCOSE, CAPILLARY
Glucose-Capillary: 63 mg/dL — ABNORMAL LOW (ref 70–99)
Glucose-Capillary: 92 mg/dL (ref 70–99)
Glucose-Capillary: 109 mg/dL — ABNORMAL HIGH (ref 70–99)
Glucose-Capillary: 96 mg/dL (ref 70–99)
Glucose-Capillary: 110 mg/dL — ABNORMAL HIGH (ref 70–99)

## 2021-04-11 LAB — HEPARIN LEVEL (UNFRACTIONATED): Heparin Unfractionated: 0.41 IU/mL (ref 0.30–0.70)

## 2021-04-11 MED ORDER — METRONIDAZOLE 500 MG PO TABS: 500.0000 mg | ORAL_TABLET | Freq: Two times a day (BID) | ORAL | Status: DC

## 2021-04-11 MED ORDER — SODIUM CHLORIDE 0.9 % IV SOLN
3.0000 g | Freq: Four times a day (QID) | INTRAVENOUS | Status: DC
  Administered 2021-04-11 – 2021-04-12 (×5): 3 g via INTRAVENOUS
  Filled 2021-04-11: qty 3
  Filled 2021-04-11 (×2): qty 8
  Filled 2021-04-11 (×3): qty 3
  Filled 2021-04-11 (×2): qty 8

## 2021-04-11 MED ORDER — SODIUM CHLORIDE 0.9 % IV SOLN
2.0000 g | INTRAVENOUS | Status: DC
  Filled 2021-04-11: qty 20

## 2021-04-11 NOTE — Progress Notes (Signed)
Occupational Therapy Treatment Patient Details Name: Sherri Dixon MRN: [id number] DOB: 01/25/1981 Today's Date: 04/11/2021    History of present illness 40 y.o. female with medical history significant of asthma, chronic diastolic CHF, hypertension, hyperlipidemia, insulin-dependent diabetes melitis, chronic microcytic anemia, GERD, history of PE on Xarelto, morbid obesity (BMI 62.19), admitted for stroke in May 2022 presented to the ED with complaints of right foot pain, swelling, and drainage after she stepped on a piece of glass   OT comments  Pt making progress with functional goals. Sessio focused on bed mobility to sit EOB, sitting activity tolerance, UB dressing, grooming, LB bathing, lateral scooting on bed towards HOB. Pt with less assist required to scoot to [facility] ([facility]) seated and to pull self towards HOB in supine with assist of bed controls. OT will continue to follow acutely to maximize level of function and safety  Follow Up Recommendations  CIR    Equipment Recommendations  Other (comment);3 in 1 bedside commode;Wheelchair (measurements OT);Wheelchair cushion (measurements OT)    Recommendations for Other Services      Precautions / Restrictions Precautions Precautions: Fall;Other (comment) Precaution Comments: Wound VAC Restrictions Weight Bearing Restrictions: Yes RLE Weight Bearing: Non weight bearing       Mobility Bed Mobility Overal bed mobility: Needs Assistance Bed Mobility: Supine to Sit;Sit to Supine     Supine to sit: Min assist Sit to supine: Max assist   General bed mobility comments: min A with R LE to EOB, max A with LEs onto bed    Transfers                      Balance Overall balance assessment: Needs assistance Sitting-balance support: Feet supported Sitting balance-Leahy Scale: Good                                     ADL either performed or assessed with clinical judgement   ADL Overall ADL's : Needs  assistance/impaired     Grooming: Wash/dry hands;Wash/dry face;Set up;Supervision/safety;Sitting   Upper Body Bathing: Set up;Supervision/ safety;Sitting Upper Body Bathing Details (indicate cue type and reason): simulated Lower Body Bathing: Moderate assistance;Sitting/lateral leans Lower Body Bathing Details (indicate cue type and reason): simulated Upper Body Dressing : Set up;Supervision/safety;Sitting                           Vision Baseline Vision/History: Wears glasses Patient Visual Report: No change from baseline     Perception     Praxis      Cognition Arousal/Alertness: Awake/alert Behavior During Therapy: WFL for tasks assessed/performed Overall Cognitive Status: Within Functional Limits for tasks assessed                                          Exercises     Shoulder Instructions       General Comments      Pertinent Vitals/ Pain       Pain Assessment: 0-10 Pain Score: 5  Pain Location: R foot in seated EOB position Pain Descriptors / Indicators: Operative site guarding;Grimacing Pain Intervention(s): Monitored during session;Repositioned  Home Living  Prior Functioning/Environment              Frequency  Min 2X/week        Progress Toward Goals  OT Goals(current goals can now be found in the care plan section)  Progress towards OT goals: Progressing toward goals  Acute Rehab OT Goals Patient Stated Goal: get stronger and go home  Plan Discharge plan remains appropriate    Co-evaluation                 AM-PAC OT "6 Clicks" Daily Activity     Outcome Measure   Help from another person eating meals?: None Help from another person taking care of personal grooming?: A Little Help from another person toileting, which includes using toliet, bedpan, or urinal?: Total Help from another person bathing (including washing, rinsing, drying)?: A  Lot Help from another person to put on and taking off regular upper body clothing?: None Help from another person to put on and taking off regular lower body clothing?: Total 6 Click Score: 15    End of Session    OT Visit Diagnosis: Other abnormalities of gait and mobility (R26.89);Muscle weakness (generalized) (M62.81);Pain Pain - Right/Left: Right Pain - part of body: Ankle and joints of foot   Activity Tolerance Patient limited by fatigue   Patient Left in bed;with call bell/phone within reach   Nurse Communication          Time: 1025-1050 OT Time Calculation (min): 25 min  Charges: OT General Charges $OT Visit: 1 Visit OT Treatments $Self Care/Home Management : 8-22 mins $Therapeutic Activity: 8-22 mins    Britt Bottom 04/11/2021, 1:26 PM

## 2021-04-11 NOTE — Progress Notes (Signed)
[facility] Infectious Diseases Follow Up Note  Patient Identification: Patient Name: Sherri Dixon MRN: [id number] [place] Date: 04/05/2021  4:27 PM Age: 40 y.o.Today's Date: 04/11/2021   Reason for Visit: Osteomyelitis  Principal Problem:   Right foot infection Active Problems:   Hypokalemia   Sepsis ([place])   Diabetes ([place])   AKI (acute kidney injury) ([place])   Antibiotics:  Vancomycin 7/8-7/9, daptomycin 7/10-current                    Zosyn 7/8-current                    Clindamycin 7/8-current  Lines/Tubes: Wound VAC in right foot, PIV's  Interval Events: Continues to be afebrile, WBC went up to 22.8, Hb went down from 11.2 to 7.4   Assessment 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 OR cultures growing Proteus mirabilis and streptococcus gallolyticus.  Blood cx 7/8 NG 2/2 sets  ABI Lower extremities WNL   AKI on CKD - Cr is downtrending  IDDM Obesity  Recommendations Will change zosyn to unasyn She is planned to go to OR tomorrow for possible amputation of middle toe or TMA in which case she will need PICC line and prolonged IV antibiotics given how extensive her infection is and includes up to calcaneus.  Further recommendations to follow based on surgical intervention. Monitor CBC and BMP + cultures   Rest of the management as per the primary team. Thank you for the consult. Please page with pertinent questions or concerns.  ______________________________________________________________________ Subjective patient seen and examined at the bedside. Denies any complaints. Denies fevers, chills, sweats. Denies nausea, vomiting, abdominal pain and diarrhea   Vitals BP (!) 158/77 (BP Location: Left Arm)   Pulse 76   Temp 97.8 F (36.6 C) (Oral)   Resp 17   Ht 5\' 2"  (1.575 m)   Wt (!) 139 kg   LMP 04/05/2021  (Exact Date)   SpO2 100%   BMI 56.05 kg/m     Physical Exam Constitutional:  lying in bed. No acute distress, morbidly obese     Comments:   Cardiovascular:     Rate and Rhythm: Normal rate and regular rhythm.     Heart sounds:   Pulmonary:     Effort: Pulmonary effort is normal.     Comments:   Abdominal:     Palpations: Abdomen is soft.     Tenderness: Obese, non tender and non distended   Musculoskeletal:        General: No swelling or tenderness. Rt foot is wrapped in a bandage, LLE with some oedema and skin thickening   Skin:    Comments: No obvious lesions or rashes   Neurological:     General: No focal deficit present.   Psychiatric:        Mood and Affect: Mood normal.   Pertinent Microbiology Results for orders placed or performed during the hospital encounter of 04/05/21  Resp Panel by RT-PCR (Flu A&B, Covid) Nasopharyngeal Swab     Status: None   Collection Time: 04/05/21  4:40 PM   Specimen: Nasopharyngeal Swab; Nasopharyngeal(NP) swabs in vial transport medium  Result Value Ref Range Status   SARS Coronavirus 2 by RT PCR NEGATIVE NEGATIVE Final    Comment: (NOTE) SARS-CoV-2 target nucleic acids are NOT DETECTED.  The SARS-CoV-2 RNA is generally detectable in upper respiratory specimens during the  acute phase of infection. The lowest concentration of SARS-CoV-2 viral copies this assay can detect is 138 copies/mL. A negative result does not preclude SARS-Cov-2 infection and should not be used as the sole basis for treatment or other patient management decisions. A negative result may occur with  improper specimen collection/handling, submission of specimen other than nasopharyngeal swab, presence of viral mutation(s) within the areas targeted by this assay, and inadequate number of viral copies(<138 copies/mL). A negative result must be combined with clinical observations, patient history, and epidemiological information. The expected result is  Negative.  Fact Sheet for Patients:  EntrepreneurPulse.com.au  Fact Sheet for Healthcare Providers:  IncredibleEmployment.be  This test is no t yet approved or cleared by the [place] FDA and  has been authorized for detection and/or diagnosis of SARS-CoV-2 by FDA under an Emergency Use Authorization (EUA). This EUA will remain  in effect (meaning this test can be used) for the duration of the COVID-19 declaration under Section 564(b)(1) of the Act, 21 U.S.C.section 360bbb-3(b)(1), unless the authorization is terminated  or revoked sooner.       Influenza A by PCR NEGATIVE NEGATIVE Final   Influenza B by PCR NEGATIVE NEGATIVE Final    Comment: (NOTE) The Xpert Xpress SARS-CoV-2/FLU/RSV plus assay is intended as an aid in the diagnosis of influenza from Nasopharyngeal swab specimens and should not be used as a sole basis for treatment. Nasal washings and aspirates are unacceptable for Xpert Xpress SARS-CoV-2/FLU/RSV testing.  Fact Sheet for Patients: EntrepreneurPulse.com.au  Fact Sheet for Healthcare Providers: IncredibleEmployment.be  This test is not yet approved or cleared by the [place] FDA and has been authorized for detection and/or diagnosis of SARS-CoV-2 by FDA under an Emergency Use Authorization (EUA). This EUA will remain in effect (meaning this test can be used) for the duration of the COVID-19 declaration under Section 564(b)(1) of the Act, 21 U.S.C. section 360bbb-3(b)(1), unless the authorization is terminated or revoked.  Performed at [facility], [place] [address] [place], [place] [address]   Blood Culture (routine x 2)     Status: None   Collection Time: 04/05/21  5:23 PM   Specimen: BLOOD  Result Value Ref Range Status   Specimen Description   Final    BLOOD BLOOD RIGHT ARM Performed at [place] [address]  [place], [place] [address]    Special Requests   Final    BOTTLES DRAWN AEROBIC ONLY Blood Culture adequate volume Performed at [place] [address], [place], [place] [address]    Culture   Final    NO GROWTH 5 DAYS Performed at [facility], [place] [address] [place], [place] [address]    Report Status 04/10/2021 FINAL  Final  Blood Culture (routine x 2)     Status: None   Collection Time: 04/05/21  7:30 PM   Specimen: BLOOD  Result Value Ref Range Status   Specimen Description   Final    BLOOD RIGHT ANTECUBITAL Performed at [place] [address], [place], [place] [address]    Special Requests   Final    BOTTLES DRAWN AEROBIC AND ANAEROBIC Blood Culture adequate volume Performed at [place] [address] [place], [place] [address]    Culture   Final    NO GROWTH 5 DAYS Performed at [facility], [place] [address], [place],  [address]    Report Status 04/10/2021 FINAL  Final  Surgical pcr screen     Status: Abnormal   Collection Time: 04/06/21  8:55 AM   Specimen: Nasal Mucosa; Nasal Swab  Result Value Ref Range Status   MRSA, PCR NEGATIVE NEGATIVE Final   Staphylococcus aureus POSITIVE (A) NEGATIVE Final    Comment: (NOTE) The Xpert SA Assay (FDA approved for NASAL specimens in patients 44 years of age and older), is one component of a comprehensive surveillance program. It is not intended to diagnose infection nor to guide or monitor treatment. Performed at [facility], [place] [address], [place], [place] [address]   Aerobic/Anaerobic Culture w Gram Stain (surgical/deep wound)     Status: None (Preliminary result)   Collection Time: 04/06/21 11:44 AM   Specimen: Abscess  Result Value Ref Range Status   Specimen Description ABSCESS  Final   Special Requests RIGHT FOOT ABSCESS SPEC A  Final   Gram Stain   Final    NO WBC SEEN MODERATE GRAM POSITIVE COCCI IN PAIRS AND CHAINS IN  TETRADS MODERATE GRAM NEGATIVE RODS FEW GRAM POSITIVE RODS Performed at [facility], [place] [address] [place], [place] [phone]    Culture   Final    ABUNDANT PROTEUS MIRABILIS MODERATE STREPTOCOCCUS GALLOLYTICUS NO ANAEROBES ISOLATED; CULTURE IN PROGRESS FOR 5 DAYS    Report Status PENDING  Incomplete   Organism ID, Bacteria PROTEUS MIRABILIS  Final   Organism ID, Bacteria STREPTOCOCCUS GALLOLYTICUS  Final      Susceptibility   Proteus mirabilis - MIC*    AMPICILLIN <=2 SENSITIVE Sensitive     CEFAZOLIN 8 SENSITIVE Sensitive     CEFEPIME <=0.12 SENSITIVE Sensitive     CEFTAZIDIME <=1 SENSITIVE Sensitive     CEFTRIAXONE <=0.25 SENSITIVE Sensitive     CIPROFLOXACIN <=0.25 SENSITIVE Sensitive     GENTAMICIN <=1 SENSITIVE Sensitive     IMIPENEM 4 SENSITIVE Sensitive     TRIMETH/SULFA <=20 SENSITIVE Sensitive     AMPICILLIN/SULBACTAM <=2 SENSITIVE Sensitive     PIP/TAZO <=4 SENSITIVE Sensitive     * ABUNDANT PROTEUS MIRABILIS   Streptococcus gallolyticus - MIC*    PENICILLIN 0.12 SENSITIVE Sensitive     CEFTRIAXONE 0.25 SENSITIVE Sensitive     ERYTHROMYCIN >=8 RESISTANT Resistant     LEVOFLOXACIN 4 INTERMEDIATE Intermediate     VANCOMYCIN 0.25 SENSITIVE Sensitive     * MODERATE STREPTOCOCCUS GALLOLYTICUS   Pertinent Lab. CBC Latest Ref Rng & Units 04/11/2021 04/10/2021 04/10/2021  WBC 4.0 - 10.5 K/uL 22.8(H) - 18.4(H)  Hemoglobin 12.0 - 15.0 g/dL 7.4(L) 11.2(L) 6.3(LL)  Hematocrit 36.0 - 46.0 % 23.5(L) 34.6(L) 20.1(L)  Platelets 150 - 400 K/uL 289 - 284     Pertinent Imaging today Plain films and CT images have been personally visualized and interpreted; radiology reports have been reviewed. Decision making incorporated into the Impression / Recommendations.  US renal 04/07/21 FINDINGS: Right Kidney:   Renal measurements: 12.8 x 5.1 x 6.7 cm = volume: 229 mL. Echogenicity within normal limits. No mass or hydronephrosis visualized. 3.0 cm simple cyst seen in the  upper pole.   Left Kidney:   Renal measurements: 11.1 x 5.6 x 6.5 cm = volume: 212 mL. Echogenicity within normal limits. No mass or hydronephrosis visualized.   Bladder:   Appears normal for degree of bladder distention.   Other:   None.   IMPRESSION: No significant sonographic abnormality of the kidneys.   Rt Foot Xray 04/05/21 FINDINGS: Vascular calcifications. No fracture or malalignment. No radiopaque foreign  body. Diffuse soft tissue swelling. Fairly extensive gas within the soft tissues over the dorsum and plantar aspect of the foot, centered over the second third and fourth distal digits.  IMPRESSION: Edema with fairly extensive soft tissue emphysema concerning for necrotizing infection. No acute osseous abnormality   I spent more than 35 minutes for this patient encounter including review of prior medical records, coordination of care  with greater than 50% of time being face to face/counseling and discussing diagnostics/treatment plan with the patient/family.  Electronically signed by:   Rosiland Oz, MD Infectious Disease Physician [facility] Pager: [phone]

## 2021-04-11 NOTE — Progress Notes (Signed)
Physical Therapy Treatment Patient Details Name: Milayna Dixon MRN: [id number] DOB: [birth year] Today's Date: 04/11/2021    History of Present Illness 40 y.o. female with medical history significant of asthma, chronic diastolic CHF, hypertension, hyperlipidemia, insulin-dependent diabetes melitis, chronic microcytic anemia, GERD, history of PE on Xarelto, morbid obesity (BMI 62.19), admitted for stroke in May 2022 presented to the ED with complaints of right foot pain, swelling, and drainage after she stepped on a piece of glass    PT Comments    Pt supine in bed and very motivated to work.  Attempted sit to stand this session with heavy assistance and mechanical sit to stand lift.  Supported RLE to maintain weight bearing.  Pt very weak on L supporting side due to old CVA.  Plan to return to lateral scoots as this is more functional for her given her deficits and weight bearing restrictions. Plan for surgery tomorrow per chart.       Follow Up Recommendations  CIR     Equipment Recommendations  None recommended by PT    Recommendations for Other Services Rehab consult     Precautions / Restrictions Precautions Precautions: Fall;Other (comment) Precaution Comments: Wound VAC Restrictions Weight Bearing Restrictions: Yes RLE Weight Bearing: Non weight bearing    Mobility  Bed Mobility Overal bed mobility: Needs Assistance Bed Mobility: Supine to Sit;Sit to Supine     Supine to sit: Min assist Sit to supine: Max assist;+2 for physical assistance   General bed mobility comments: Min assistance to move trunk into sitting. Required increased assistance for back to bed after fatigue set in.    Transfers Overall transfer level: Needs assistance Equipment used: Ambulation equipment used (utilized sara +) Transfers: Sit to/from Stand Sit to Stand: Total assist;+2 physical assistance (+3 for safety)         General transfer comment: Pt performed x 2 trials with good quality  and multiple repeated attempts but unable to achieve full stance once fatigue.  Her reliant limb is her L which has residual deficits from her CVA.  Pt very motivated.  On last attempt to stand moved bed down to place patient at higher position to return back to bed.  Ambulation/Gait Ambulation/Gait assistance:  (remains unable to maintain weight bearing in standing without significant assistance.)               Stairs             Wheelchair Mobility    Modified Rankin (Stroke Patients Only)       Balance Overall balance assessment: Needs assistance Sitting-balance support: Feet supported Sitting balance-Leahy Scale: Good       Standing balance-Leahy Scale: Poor                              Cognition Arousal/Alertness: Awake/alert Behavior During Therapy: WFL for tasks assessed/performed Overall Cognitive Status: Within Functional Limits for tasks assessed                                        Exercises      General Comments        Pertinent Vitals/Pain Pain Assessment: 0-10 Pain Score: 5  Pain Location: R foot in seated EOB position Pain Descriptors / Indicators: Operative site guarding;Grimacing Pain Intervention(s): Monitored during session;Repositioned    Home Living  Prior Function            PT Goals (current goals can now be found in the care plan section) Acute Rehab PT Goals Patient Stated Goal: get stronger and go home Potential to Achieve Goals: Good Progress towards PT goals: Progressing toward goals    Frequency    Min 5X/week      PT Plan Current plan remains appropriate    Co-evaluation              AM-PAC PT "6 Clicks" Mobility   Outcome Measure  Help needed turning from your back to your side while in a flat bed without using bedrails?: A Little Help needed moving from lying on your back to sitting on the side of a flat bed without using bedrails?: A  Little Help needed moving to and from a bed to a chair (including a wheelchair)?: A Lot Help needed standing up from a chair using your arms (e.g., wheelchair or bedside chair)?: Total Help needed to walk in hospital room?: Total Help needed climbing 3-5 steps with a railing? : Total 6 Click Score: 11    End of Session Equipment Utilized During Treatment: Gait belt Activity Tolerance: Patient tolerated treatment well Patient left: in bed;with call bell/phone within reach;with bed alarm set Nurse Communication: Mobility status;Need for lift equipment PT Visit Diagnosis: Unsteadiness on feet (R26.81)     Time: 7121-9758 PT Time Calculation (min) (ACUTE ONLY): 32 min  Charges:  $Therapeutic Activity: 23-37 mins                     Erasmo Leventhal , PTA Acute Rehabilitation Services Pager [phone] Office [phone]    Sherri Dixon Sherri Dixon 04/11/2021, 3:56 PM

## 2021-04-11 NOTE — Progress Notes (Signed)
ANTICOAGULATION CONSULT NOTE  Pharmacy Consult:  Heparin  Indication:  History of PE   Allergies  Allergen Reactions   Amlodipine Swelling   Humalog Kwikpen [Insulin Lispro] Other (See Comments)    75/50---swelling and ulcers in mouth    Amoxicillin     constipation    Patient Measurements: Height: 5\' 2"  (157.5 cm) Weight: (!) 139 kg (306 lb 7 oz) IBW/kg (Calculated) : 50.1 Heparin Dosing Weight: 86 kg  Vital Signs: Temp: 97.8 F (36.6 C) (07/14 0800) Temp Source: Oral (07/14 0800) BP: 158/77 (07/14 0800) Pulse Rate: 76 (07/14 0800)  Labs: Recent Labs    04/08/21 1246 04/08/21 1246 04/09/21 0056 04/09/21 1212 04/10/21 0243 04/10/21 1541 04/11/21 0022  HGB 7.8*  --  7.3*  --  6.3* 11.2* 7.4*  HCT 24.7*  --  23.4*  --  20.1* 34.6* 23.5*  PLT 426*  --  359  --  284  --  289  APTT  --   --  88*  --   --   --   --   HEPARINUNFRC  --    < > 0.33 0.43 0.50  --  0.41  CREATININE 1.89*  --  1.81*  --  1.64*  --  1.43*  CKTOTAL 22*  --   --   --   --   --   --    < > = values in this interval not displayed.     Estimated Creatinine Clearance: 69.3 mL/min (A) (by C-G formula based on SCr of 1.43 mg/dL (H)).   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  Pharmacy also consulted to bridge with IV heparin.    Heparin level 0.41, therapeutic.  Received 1 unit of blood, Hgb  responded- now at 7.4. Patient is on her menstrual cycle. No issues with heparin infusion and no signs of bleeding per RN.   Goal of Therapy:  Heparin level 0.3-0.7 units/ml, aim for low goal with anemia Monitor platelets by anticoagulation protocol: Yes   Plan:  Continue heparin at 1450 units/hr Daily heparin level and CBC F/U bleeding ans restart [place], PharmD PGY2 Infectious Diseases Pharmacy Resident   Please check [email or web address] for unit-specific pharmacy phone numbers

## 2021-04-11 NOTE — Progress Notes (Signed)
[facility] PROGRESS NOTE    Progress Note  Sherri Dixon  TGG:[id number] DOB: 04-Aug-1981 DOA: 04/05/2021 PCP: Tomasa Hose, NP     Brief Narrative:   Sherri Dixon is an 40 y.o. female past medical history of asthma, chronic diastolic heart failure, essential hypertension diabetes mellitus insulin-dependent, microcytic anemia history of PE on Xarelto comes in for right foot pain and swelling after stepping on a piece of glass about a week ago.  Was found to have a necrotic infected foot orthopedic surgery was consulted she was taken to the OR with irrigation and debridement and wound VAC placement.  Antibiotics: 04/05/2021 IV Zosyn 04/05/2021 IV clindamycin 04/07/2021 IV daptomycin  Microbiology data: Blood culture: Negative till date  Procedures: None  Assessment/Plan:   Sepsis due to necrotizing Right foot infection I&D performed on 04/06/2021.  With significant necrotizing tissue and op note wound VAC in place. Now on only IV Zosyn. Culture grew Streptococcus gallolyticus and Proteus  Orthopedic surgery she is scheduled for 04/12/2021 for intervention and probable amputation ID on board appreciate assistance.  Acute kidney injury: In the setting of sepsis and ARB use.  With a baseline creatinine of 1.1-1.3 Creatinine trending down with fluid hydration.  Urinary retention: Appears to be resolved.  Hypokalemia: Replete orally now resolved.  Microcytic anemia: In a menstruating female with heavy periods on Xarelto.  She is now on IV heparin. This morning was 6.3 being transfused 2 units of packed red blood cells she is CBC posttransfusion all.  Chronic diastolic heart failure: Holding Lasix volume status is appears euvolemic on physical exam.  Hyperlipidemia:  continue Crestor.  Diabetes mellitus type 2: With an A1c of 8.9, good control blood glucose, will hold Lantus tonight.  History of PE/history of CVA: Currently on a heparin drip, Xarelto has been  held . Will need to hold IV heparin 4 hours prior to procedure.  Essential hypertension: ARB discontinued due to acute kidney injury. Continue metoprolol and hydralazine.   DVT prophylaxis: heparin Family Communication:husband Status is: Inpatient  Remains inpatient appropriate because:Hemodynamically unstable  Dispo: The patient is from: Home              Anticipated d/c is to: Home              Patient currently is not medically stable to d/c.   Difficult to place patient No        Code Status:     Code Status Orders  (From admission, onward)           Start     Ordered   04/05/21 2049  Full code  Continuous        04/05/21 2051           Code Status History     Date Active Date Inactive Code Status Order ID Comments User Context   01/25/2021 1948 01/28/2021 1939 Full Code [id number]  Rise Patience, MD ED   12/12/2020 2345 12/15/2020 1717 Full Code [id number]  Rise Patience, MD ED   07/27/2019 1646 07/31/2019 2132 Full Code [id number]  Cleon Gustin, MD Inpatient   03/10/2019 1030 03/11/2019 1559 Full Code [id number]  Domenic Polite, MD ED   05/22/2018 1455 05/24/2018 1917 Full Code [id number]  Debbe Odea, MD ED   06/05/2016 1613 06/06/2016 1540 Full Code [id number]  Caren Griffins, MD ED         IV Access:   Peripheral IV   Procedures and diagnostic studies:   VAS  [place] ABI WITH/WO TBI  Result Date: 04/10/2021  LOWER EXTREMITY DOPPLER STUDY Patient Name:  Sherri Dixon  Date of Exam:   04/09/2021 Medical Rec #: [id number]      Accession #:    [id number] Date of Birth: March 11, 1981       Patient Gender: F Patient Age:   47Y Exam Location:  [facility] Procedure:      VAS [place] ABI WITH/WO TBI Referring Phys: [place] --------------------------------------------------------------------------------  Indications: Post op. High Risk Factors: Diabetes, prior CVA.  Performing Technologist: Archie Patten RVS  Examination Guidelines: A  complete evaluation includes at minimum, Doppler waveform signals and systolic blood pressure reading at the level of bilateral brachial, anterior tibial, and posterior tibial arteries, when vessel segments are accessible. Bilateral testing is considered an integral part of a complete examination. Photoelectric Plethysmograph (PPG) waveforms and toe systolic pressure readings are included as required and additional duplex testing as needed. Limited examinations for reoccurring indications may be performed as noted.  ABI Findings: +--------+------------------+-----+---------+--------+ Right   Rt Pressure (mmHg)IndexWaveform Comment  +--------+------------------+-----+---------+--------+ [id number]                    triphasic         +--------+------------------+-----+---------+--------+ PTA     188               0.91 biphasic          +--------+------------------+-----+---------+--------+ DP      225               1.09 biphasic          +--------+------------------+-----+---------+--------+ +--------+------------------+-----+---------+-------+ Left    Lt Pressure (mmHg)IndexWaveform Comment +--------+------------------+-----+---------+-------+ [id number]                    triphasic        +--------+------------------+-----+---------+-------+ PTA     255               1.24 triphasic        +--------+------------------+-----+---------+-------+ DP      233               1.13 triphasic        +--------+------------------+-----+---------+-------+ +-------+-----------+-----------+------------+------------+ ABI/TBIToday's ABIToday's TBIPrevious ABIPrevious TBI +-------+-----------+-----------+------------+------------+ Right  1.09                                           +-------+-----------+-----------+------------+------------+ Left   1.24                                           +-------+-----------+-----------+------------+------------+  Summary:  Right: Resting right ankle-brachial index is within normal range. No evidence of significant right lower extremity arterial disease. Left: Resting left ankle-brachial index is within normal range. No evidence of significant left lower extremity arterial disease. ABIs are unreliable.  *See table(s) above for measurements and observations.  Electronically signed by Jamelle Haring on 04/10/2021 at 7:17:48 PM.    Final      Medical Consultants:   None.   Subjective:    Sherri Dixon pain is controlled.  Objective:    Vitals:   04/10/21 2057 04/11/21 0037 04/11/21 0416 04/11/21 0800  BP: 131/65 (!) 112/50 (!) 145/66 (!) 158/77  Pulse: 72 75 79 76  Resp:  18  16 17   Temp: 99 F (37.2 C) 98.6 F (37 C) 98.8 F (37.1 C) 97.8 F (36.6 C)  TempSrc: Oral Oral Oral Oral  SpO2: 99% 100% 100% 100%  Weight:      Height:       SpO2: 100 %   Intake/Output Summary (Last 24 hours) at 04/11/2021 0935 Last data filed at 04/11/2021 0800 Gross per 24 hour  Intake 1366 ml  Output 1475 ml  Net -109 ml    Filed Weights   04/06/21 1108 04/08/21 0500 04/09/21 0451  Weight: (!) 154.2 kg (!) 139.2 kg (!) 139 kg    Exam: General exam: In no acute distress. Respiratory system: Good air movement and clear to auscultation. Cardiovascular system: S1 & S2 heard, RRR. No JVD. Gastrointestinal system: Abdomen is nondistended, soft and nontender.  Extremities: No pedal edema. Skin: No rashes, lesions or ulcers Psychiatry: Judgement and insight appear normal. Mood & affect appropriate.   Data Reviewed:    Labs: Basic Metabolic Panel: Recent Labs  Lab 04/06/21 0526 04/07/21 0123 04/08/21 1246 04/09/21 0056 04/10/21 0243 04/11/21 0022  NA 135 133* 136 134* 136 134*  K 3.3* 3.9 3.4* 3.7 3.7 3.8  CL 104 103 105 106 107 105  CO2 25 23 25 23 22 22   GLUCOSE 144* 258* 80 118* 146* 92  BUN 19 23* 23* 24* 21* 19  CREATININE 1.65* 1.84* 1.89* 1.81* 1.64* 1.43*  CALCIUM 7.5* 7.7* 7.7* 7.5* 7.3*  7.1*  MG 1.8  --   --   --   --   --     GFR Estimated Creatinine Clearance: 69.3 mL/min (A) (by C-G formula based on SCr of 1.43 mg/dL (H)). Liver Function Tests: Recent Labs  Lab 04/05/21 1722  AST 16  ALT 18  ALKPHOS 266*  BILITOT 0.4  PROT 5.9*  ALBUMIN <1.0*    No results for input(s): LIPASE, AMYLASE in the last 168 hours. No results for input(s): AMMONIA in the last 168 hours. Coagulation profile Recent Labs  Lab 04/05/21 1722  INR 1.0    COVID-19 Labs  Recent Labs    04/08/21 1246  CRP 13.9*     Lab Results  Component Value Date   SARSCOV2NAA NEGATIVE 04/05/2021   [place] NEGATIVE 01/25/2021   [place] NEGATIVE 12/12/2020   SARSCOV2NAA NOT DETECTED 07/25/2019    CBC: Recent Labs  Lab 04/05/21 1722 04/06/21 0303 04/07/21 0123 04/08/21 1246 04/09/21 0056 04/10/21 0243 04/10/21 1541 04/11/21 0022  WBC 26.2*   < > 21.0* 15.6* 14.1* 18.4*  --  22.8*  NEUTROABS 24.1*  --   --   --   --   --   --   --   HGB 8.6*   < > 7.6* 7.8* 7.3* 6.3* 11.2* 7.4*  HCT 27.7*   < > 24.2* 24.7* 23.4* 20.1* 34.6* 23.5*  MCV 86.3   < > 84.0 84.0 84.5 85.5  --  85.8  PLT 546*   < > 421* 426* 359 284  --  289   < > = values in this interval not displayed.    Cardiac Enzymes: Recent Labs  Lab 04/07/21 1005 04/08/21 1246  CKTOTAL 30* 22*    BNP (last 3 results) No results for input(s): PROBNP in the last 8760 hours. CBG: Recent Labs  Lab 04/10/21 1205 04/10/21 1742 04/10/21 2016 04/11/21 0754 04/11/21 0829  GLUCAP 80 83 110* 63* 92    D-Dimer: No results for input(s): DDIMER in the  last 72 hours. Hgb A1c: Recent Labs    04/08/21 1246  HGBA1C 8.7*    Lipid Profile: No results for input(s): CHOL, HDL, LDLCALC, TRIG, CHOLHDL, LDLDIRECT in the last 72 hours. Thyroid function studies: No results for input(s): TSH, T4TOTAL, T3FREE, THYROIDAB in the last 72 hours.  Invalid input(s): FREET3 Anemia work up: No results for input(s):  VITAMINB12, FOLATE, FERRITIN, TIBC, IRON, RETICCTPCT in the last 72 hours. Sepsis Labs: Recent Labs  Lab 04/05/21 1722 04/05/21 1839 04/06/21 0303 04/08/21 1246 04/09/21 0056 04/10/21 0243 04/11/21 0022  WBC 26.2*  --    < > 15.6* 14.1* 18.4* 22.8*  LATICACIDVEN 1.6 1.4  --   --   --   --   --    < > = values in this interval not displayed.    Microbiology Recent Results (from the past 240 hour(s))  Resp Panel by RT-PCR (Flu A&B, Covid) Nasopharyngeal Swab     Status: None   Collection Time: 04/05/21  4:40 PM   Specimen: Nasopharyngeal Swab; Nasopharyngeal(NP) swabs in vial transport medium  Result Value Ref Range Status   SARS Coronavirus 2 by RT PCR NEGATIVE NEGATIVE Final    Comment: (NOTE) SARS-CoV-2 target nucleic acids are NOT DETECTED.  The SARS-CoV-2 RNA is generally detectable in upper respiratory specimens during the acute phase of infection. The lowest concentration of SARS-CoV-2 viral copies this assay can detect is 138 copies/mL. A negative result does not preclude SARS-Cov-2 infection and should not be used as the sole basis for treatment or other patient management decisions. A negative result may occur with  improper specimen collection/handling, submission of specimen other than nasopharyngeal swab, presence of viral mutation(s) within the areas targeted by this assay, and inadequate number of viral copies(<138 copies/mL). A negative result must be combined with clinical observations, patient history, and epidemiological information. The expected result is Negative.  Fact Sheet for Patients:  EntrepreneurPulse.com.au  Fact Sheet for Healthcare Providers:  IncredibleEmployment.be  This test is no t yet approved or cleared by the [place] FDA and  has been authorized for detection and/or diagnosis of SARS-CoV-2 by FDA under an Emergency Use Authorization (EUA). This EUA will remain  in effect (meaning this test can  be used) for the duration of the COVID-19 declaration under Section 564(b)(1) of the Act, 21 U.S.C.section 360bbb-3(b)(1), unless the authorization is terminated  or revoked sooner.       Influenza A by PCR NEGATIVE NEGATIVE Final   Influenza B by PCR NEGATIVE NEGATIVE Final    Comment: (NOTE) The Xpert Xpress SARS-CoV-2/FLU/RSV plus assay is intended as an aid in the diagnosis of influenza from Nasopharyngeal swab specimens and should not be used as a sole basis for treatment. Nasal washings and aspirates are unacceptable for Xpert Xpress SARS-CoV-2/FLU/RSV testing.  Fact Sheet for Patients: EntrepreneurPulse.com.au  Fact Sheet for Healthcare Providers: IncredibleEmployment.be  This test is not yet approved or cleared by the [place] FDA and has been authorized for detection and/or diagnosis of SARS-CoV-2 by FDA under an Emergency Use Authorization (EUA). This EUA will remain in effect (meaning this test can be used) for the duration of the COVID-19 declaration under Section 564(b)(1) of the Act, 21 U.S.C. section 360bbb-3(b)(1), unless the authorization is terminated or revoked.  Performed at [facility], [place] [address] [place], [place] [address]   Blood Culture (routine x 2)     Status: None   Collection Time: 04/05/21  5:23 PM   Specimen: BLOOD  Result Value Ref Range Status   Specimen Description   Final    BLOOD BLOOD RIGHT ARM Performed at [place] [address], [place], [place] [address]    Special Requests   Final    BOTTLES DRAWN AEROBIC ONLY Blood Culture adequate volume Performed at [place] [address], [place], [place] [address]    Culture   Final    NO GROWTH 5 DAYS Performed at [facility], [place] [address] [place], [place] [address]    Report Status 04/10/2021 FINAL  Final  Blood Culture (routine x 2)     Status: None   Collection  Time: 04/05/21  7:30 PM   Specimen: BLOOD  Result Value Ref Range Status   Specimen Description   Final    BLOOD RIGHT ANTECUBITAL Performed at [place] [address], [place], [place] [address]    Special Requests   Final    BOTTLES DRAWN AEROBIC AND ANAEROBIC Blood Culture adequate volume Performed at [place] [address] [place], [place] [address]    Culture   Final    NO GROWTH 5 DAYS Performed at [facility], [place] [address] [place], [place] [address]    Report Status 04/10/2021 FINAL  Final  Surgical pcr screen     Status: Abnormal   Collection Time: 04/06/21  8:55 AM   Specimen: Nasal Mucosa; Nasal Swab  Result Value Ref Range Status   MRSA, PCR NEGATIVE NEGATIVE Final   Staphylococcus aureus POSITIVE (A) NEGATIVE Final    Comment: (NOTE) The Xpert SA Assay (FDA approved for NASAL specimens in patients 65 years of age and older), is one component of a comprehensive surveillance program. It is not intended to diagnose infection nor to guide or monitor treatment. Performed at [facility], [place] [address], [place], [place] [address]   Aerobic/Anaerobic Culture w Gram Stain (surgical/deep wound)     Status: None (Preliminary result)   Collection Time: 04/06/21 11:44 AM   Specimen: Abscess  Result Value Ref Range Status   Specimen Description ABSCESS  Final   Special Requests RIGHT FOOT ABSCESS SPEC A  Final   Gram Stain   Final    NO WBC SEEN MODERATE GRAM POSITIVE COCCI IN PAIRS AND CHAINS IN TETRADS MODERATE GRAM NEGATIVE RODS FEW GRAM POSITIVE RODS Performed at [facility], [place] [address] [place], [place] [address]    Culture   Final    ABUNDANT PROTEUS MIRABILIS MODERATE STREPTOCOCCUS GALLOLYTICUS NO ANAEROBES ISOLATED; CULTURE IN PROGRESS FOR 5 DAYS    Report Status PENDING  Incomplete   Organism ID, Bacteria PROTEUS MIRABILIS  Final   Organism ID, Bacteria STREPTOCOCCUS GALLOLYTICUS   Final      Susceptibility   Proteus mirabilis - MIC*    AMPICILLIN <=2 SENSITIVE Sensitive     CEFAZOLIN 8 SENSITIVE Sensitive     CEFEPIME <=0.12 SENSITIVE Sensitive     CEFTAZIDIME <=1 SENSITIVE Sensitive     CEFTRIAXONE <=0.25 SENSITIVE Sensitive     CIPROFLOXACIN <=0.25 SENSITIVE Sensitive     GENTAMICIN <=1 SENSITIVE Sensitive     IMIPENEM 4 SENSITIVE Sensitive     TRIMETH/SULFA <=20 SENSITIVE Sensitive     AMPICILLIN/SULBACTAM <=2 SENSITIVE Sensitive     PIP/TAZO <=4 SENSITIVE Sensitive     * ABUNDANT PROTEUS MIRABILIS   Streptococcus gallolyticus - MIC*    PENICILLIN 0.12 SENSITIVE Sensitive  CEFTRIAXONE 0.25 SENSITIVE Sensitive     ERYTHROMYCIN >=8 RESISTANT Resistant     LEVOFLOXACIN 4 INTERMEDIATE Intermediate     VANCOMYCIN 0.25 SENSITIVE Sensitive     * MODERATE STREPTOCOCCUS GALLOLYTICUS     Medications:    sodium chloride   Intravenous Once   Chlorhexidine Gluconate Cloth  6 each Topical Q0600   hydrALAZINE  50 mg Oral TID   insulin aspart  0-9 Units Subcutaneous TID WC   insulin glargine  5 Units Subcutaneous BID   metoprolol succinate  100 mg Oral Daily   mupirocin ointment  1 application Nasal BID   pantoprazole  40 mg Oral BID   polyethylene glycol  17 g Oral BID   Continuous Infusions:  ampicillin-sulbactam (UNASYN) IV     heparin 1,450 Units/hr (04/11/21 0723)      LOS: 6 days   Charlynne Cousins  [facility]  04/11/2021, 9:35 AM

## 2021-04-12 ENCOUNTER — Encounter (HOSPITAL_COMMUNITY): Payer: Self-pay | Admitting: Internal Medicine

## 2021-04-12 ENCOUNTER — Inpatient Hospital Stay (HOSPITAL_COMMUNITY): Payer: 59 | Admitting: Anesthesiology

## 2021-04-12 ENCOUNTER — Other Ambulatory Visit: Payer: Self-pay

## 2021-04-12 ENCOUNTER — Ambulatory Visit (HOSPITAL_COMMUNITY): Admission: RE | Admit: 2021-04-12 | Payer: 59 | Source: Home / Self Care | Admitting: Orthopaedic Surgery

## 2021-04-12 ENCOUNTER — Encounter (HOSPITAL_COMMUNITY)
Admission: EM | Disposition: A | Discharge status: Home / Self Care | Payer: Self-pay | Source: Home / Self Care | Attending: Internal Medicine

## 2021-04-12 DIAGNOSIS — L089 Local infection of the skin and subcutaneous tissue, unspecified: Secondary | ICD-10-CM | POA: Diagnosis not present

## 2021-04-12 DIAGNOSIS — M7989 Other specified soft tissue disorders: Secondary | ICD-10-CM | POA: Diagnosis not present

## 2021-04-12 DIAGNOSIS — N179 Acute kidney failure, unspecified: Secondary | ICD-10-CM | POA: Diagnosis not present

## 2021-04-12 DIAGNOSIS — E876 Hypokalemia: Secondary | ICD-10-CM | POA: Diagnosis not present

## 2021-04-12 HISTORY — PX: I & D EXTREMITY: SHX5045

## 2021-04-12 HISTORY — PX: TRANSMETATARSAL AMPUTATION: SHX6197

## 2021-04-12 HISTORY — PX: APPLICATION OF WOUND VAC: SHX5189

## 2021-04-12 LAB — GLUCOSE, CAPILLARY
Glucose-Capillary: 138 mg/dL — ABNORMAL HIGH (ref 70–99)
Glucose-Capillary: 96 mg/dL (ref 70–99)
Glucose-Capillary: 93 mg/dL (ref 70–99)
Glucose-Capillary: 84 mg/dL (ref 70–99)
Glucose-Capillary: 176 mg/dL — ABNORMAL HIGH (ref 70–99)

## 2021-04-12 LAB — CBC
HCT: 22.7 % — ABNORMAL LOW (ref 36.0–46.0)
Hemoglobin: 7.4 g/dL — ABNORMAL LOW (ref 12.0–15.0)
MCH: 27.5 pg (ref 26.0–34.0)
MCHC: 32.6 g/dL (ref 30.0–36.0)
MCV: 84.4 fL (ref 80.0–100.0)
Platelets: 274 10*3/uL (ref 150–400)
RBC: 2.69 MIL/uL — ABNORMAL LOW (ref 3.87–5.11)
RDW: 15.7 % — ABNORMAL HIGH (ref 11.5–15.5)
WBC: 18.9 10*3/uL — ABNORMAL HIGH (ref 4.0–10.5)
nRBC: 0 % (ref 0.0–0.2)

## 2021-04-12 LAB — BASIC METABOLIC PANEL
Anion gap: 5 (ref 5–15)
BUN: 17 mg/dL (ref 6–20)
CO2: 25 mmol/L (ref 22–32)
Calcium: 7.6 mg/dL — ABNORMAL LOW (ref 8.9–10.3)
Chloride: 106 mmol/L (ref 98–111)
Creatinine, Ser: 1.34 mg/dL — ABNORMAL HIGH (ref 0.44–1.00)
GFR, Estimated: 51 mL/min — ABNORMAL LOW (ref >60)
Glucose, Bld: 140 mg/dL — ABNORMAL HIGH (ref 70–99)
Potassium: 3.8 mmol/L (ref 3.5–5.1)
Sodium: 136 mmol/L (ref 135–145)

## 2021-04-12 LAB — HEPARIN LEVEL (UNFRACTIONATED): Heparin Unfractionated: 0.62 IU/mL (ref 0.30–0.70)

## 2021-04-12 SURGERY — AMPUTATION, FOOT, TRANSMETATARSAL
Anesthesia: General | Site: Toe | Laterality: Right

## 2021-04-12 MED ORDER — MIDAZOLAM HCL 5 MG/5ML IJ SOLN
INTRAMUSCULAR | Status: DC | PRN
  Administered 2021-04-12: 2 mg via INTRAVENOUS

## 2021-04-12 MED ORDER — FENTANYL CITRATE (PF) 100 MCG/2ML IJ SOLN
INTRAMUSCULAR | Status: AC
  Filled 2021-04-12: qty 2

## 2021-04-12 MED ORDER — HEPARIN (PORCINE) 25000 UT/250ML-% IV SOLN
1400.0000 [IU]/h | INTRAVENOUS | Status: DC
  Administered 2021-04-13: 1400 [IU]/h via INTRAVENOUS
  Filled 2021-04-12: qty 250

## 2021-04-12 MED ORDER — GUAIFENESIN-DM 100-10 MG/5ML PO SYRP: 15.0000 mL | ORAL_SOLUTION | ORAL | Status: DC | PRN

## 2021-04-12 MED ORDER — POTASSIUM CHLORIDE CRYS ER 20 MEQ PO TBCR: 20.0000 meq | EXTENDED_RELEASE_TABLET | Freq: Every day | ORAL | Status: DC | PRN

## 2021-04-12 MED ORDER — SODIUM CHLORIDE 0.9 % IV SOLN
2.0000 g | INTRAVENOUS | Status: DC
  Administered 2021-04-12 – 2021-04-16 (×5): 2 g via INTRAVENOUS
  Filled 2021-04-12: qty 2
  Filled 2021-04-12 (×2): qty 20
  Filled 2021-04-12 (×2): qty 2
  Filled 2021-04-12: qty 20

## 2021-04-12 MED ORDER — PHENOL 1.4 % MT LIQD: 1.0000 | OROMUCOSAL | Status: DC | PRN

## 2021-04-12 MED ORDER — DEXAMETHASONE SODIUM PHOSPHATE 10 MG/ML IJ SOLN
INTRAMUSCULAR | Status: DC | PRN
  Administered 2021-04-12: 5 mg via INTRAVENOUS

## 2021-04-12 MED ORDER — CLINDAMYCIN PHOSPHATE 900 MG/50ML IV SOLN
900.0000 mg | INTRAVENOUS | Status: AC
  Administered 2021-04-12: 900 mg via INTRAVENOUS
  Filled 2021-04-12: qty 50

## 2021-04-12 MED ORDER — FENTANYL CITRATE (PF) 100 MCG/2ML IJ SOLN
INTRAMUSCULAR | Status: AC
  Administered 2021-04-12: 50 ug via INTRAVENOUS
  Filled 2021-04-12: qty 2

## 2021-04-12 MED ORDER — VANCOMYCIN HCL 1000 MG IV SOLR
INTRAVENOUS | Status: DC | PRN
  Administered 2021-04-12: 1000 mg

## 2021-04-12 MED ORDER — ALUM & MAG HYDROXIDE-SIMETH 200-200-20 MG/5ML PO SUSP: 15.0000 mL | ORAL | Status: DC | PRN

## 2021-04-12 MED ORDER — SODIUM CHLORIDE 0.9 % IR SOLN
Status: DC | PRN
  Administered 2021-04-12: 3000 mL

## 2021-04-12 MED ORDER — ONDANSETRON HCL 4 MG/2ML IJ SOLN: 4.0000 mg | Freq: Four times a day (QID) | INTRAMUSCULAR | Status: DC | PRN

## 2021-04-12 MED ORDER — METOPROLOL TARTRATE 5 MG/5ML IV SOLN: 2.0000 mg | INTRAVENOUS | Status: DC | PRN

## 2021-04-12 MED ORDER — PROPOFOL 10 MG/ML IV BOLUS
INTRAVENOUS | Status: DC | PRN
  Administered 2021-04-12: 150 mg via INTRAVENOUS

## 2021-04-12 MED ORDER — LIDOCAINE 2% (20 MG/ML) 5 ML SYRINGE
INTRAMUSCULAR | Status: DC | PRN
  Administered 2021-04-12: 60 mg via INTRAVENOUS

## 2021-04-12 MED ORDER — LACTATED RINGERS IV SOLN: INTRAVENOUS | Status: DC

## 2021-04-12 MED ORDER — METRONIDAZOLE 500 MG PO TABS
500.0000 mg | ORAL_TABLET | Freq: Three times a day (TID) | ORAL | Status: DC
  Administered 2021-04-12 – 2021-04-17 (×15): 500 mg via ORAL
  Filled 2021-04-12 (×15): qty 1

## 2021-04-12 MED ORDER — VANCOMYCIN HCL 1000 MG IV SOLR
INTRAVENOUS | Status: AC
  Filled 2021-04-12: qty 1000

## 2021-04-12 MED ORDER — LABETALOL HCL 5 MG/ML IV SOLN: 10.0000 mg | INTRAVENOUS | Status: DC | PRN

## 2021-04-12 MED ORDER — PROMETHAZINE HCL 25 MG/ML IJ SOLN: 6.2500 mg | INTRAMUSCULAR | Status: DC | PRN

## 2021-04-12 MED ORDER — 0.9 % SODIUM CHLORIDE (POUR BTL) OPTIME
TOPICAL | Status: DC | PRN
  Administered 2021-04-12: 1000 mL

## 2021-04-12 MED ORDER — PHENYLEPHRINE 40 MCG/ML (10ML) SYRINGE FOR IV PUSH (FOR BLOOD PRESSURE SUPPORT)
PREFILLED_SYRINGE | INTRAVENOUS | Status: DC | PRN
  Administered 2021-04-12 (×3): 80 ug via INTRAVENOUS

## 2021-04-12 MED ORDER — PANTOPRAZOLE SODIUM 40 MG PO TBEC
40.0000 mg | DELAYED_RELEASE_TABLET | Freq: Every day | ORAL | Status: DC
  Administered 2021-04-12 – 2021-04-16 (×5): 40 mg via ORAL
  Filled 2021-04-12 (×5): qty 1

## 2021-04-12 MED ORDER — HYDRALAZINE HCL 20 MG/ML IJ SOLN: 5.0000 mg | INTRAMUSCULAR | Status: DC | PRN

## 2021-04-12 MED ORDER — ACETAMINOPHEN 10 MG/ML IV SOLN
INTRAVENOUS | Status: AC
  Administered 2021-04-12: 1000 mg via INTRAVENOUS
  Filled 2021-04-12: qty 100

## 2021-04-12 MED ORDER — FENTANYL CITRATE (PF) 250 MCG/5ML IJ SOLN
INTRAMUSCULAR | Status: DC | PRN
  Administered 2021-04-12 (×2): 50 ug via INTRAVENOUS

## 2021-04-12 MED ORDER — ACETAMINOPHEN 10 MG/ML IV SOLN: 1000.0000 mg | Freq: Once | INTRAVENOUS | Status: DC | PRN

## 2021-04-12 MED ORDER — FUROSEMIDE 80 MG PO TABS
80.0000 mg | ORAL_TABLET | Freq: Two times a day (BID) | ORAL | Status: DC
  Administered 2021-04-12 – 2021-04-17 (×10): 80 mg via ORAL
  Filled 2021-04-12 (×10): qty 1

## 2021-04-12 MED ORDER — FENTANYL CITRATE (PF) 100 MCG/2ML IJ SOLN
25.0000 ug | INTRAMUSCULAR | Status: DC | PRN
  Administered 2021-04-12 (×2): 50 ug via INTRAVENOUS

## 2021-04-12 MED ORDER — ROSUVASTATIN CALCIUM 20 MG PO TABS
40.0000 mg | ORAL_TABLET | Freq: Every day | ORAL | Status: DC
  Administered 2021-04-13 – 2021-04-17 (×5): 40 mg via ORAL
  Filled 2021-04-12 (×6): qty 2

## 2021-04-12 MED ORDER — ORAL CARE MOUTH RINSE: 15.0000 mL | Freq: Once | OROMUCOSAL | Status: AC

## 2021-04-12 MED ORDER — CHLORHEXIDINE GLUCONATE 0.12 % MT SOLN
15.0000 mL | Freq: Once | OROMUCOSAL | Status: AC
  Administered 2021-04-12: 15 mL via OROMUCOSAL
  Filled 2021-04-12: qty 15

## 2021-04-12 MED ORDER — GABAPENTIN 300 MG PO CAPS
300.0000 mg | ORAL_CAPSULE | Freq: Every day | ORAL | Status: DC | PRN
  Administered 2021-04-14: 300 mg via ORAL
  Filled 2021-04-12: qty 1

## 2021-04-12 MED ORDER — DOCUSATE SODIUM 100 MG PO CAPS
100.0000 mg | ORAL_CAPSULE | Freq: Every day | ORAL | Status: DC
  Administered 2021-04-13 – 2021-04-17 (×5): 100 mg via ORAL
  Filled 2021-04-12 (×5): qty 1

## 2021-04-12 MED ORDER — MAGNESIUM SULFATE 2 GM/50ML IV SOLN
2.0000 g | Freq: Every day | INTRAVENOUS | Status: DC | PRN
  Filled 2021-04-12: qty 50

## 2021-04-12 SURGICAL SUPPLY — 58 items
BAG COUNTER SPONGE SURGICOUNT (BAG) IMPLANT
BAG SPNG CNTER NS LX DISP (BAG)
BANDAGE ESMARK 6X9 LF (GAUZE/BANDAGES/DRESSINGS) IMPLANT
BLADE LONG MED 31X9 (MISCELLANEOUS) ×3 IMPLANT
BNDG CMPR 9X4 STRL LF SNTH (GAUZE/BANDAGES/DRESSINGS)
BNDG CMPR 9X6 STRL LF SNTH (GAUZE/BANDAGES/DRESSINGS)
BNDG CMPR MED 10X6 ELC LF (GAUZE/BANDAGES/DRESSINGS)
BNDG COHESIVE 4X5 TAN STRL (GAUZE/BANDAGES/DRESSINGS) IMPLANT
BNDG ELASTIC 4X5.8 VLCR STR LF (GAUZE/BANDAGES/DRESSINGS) ×3 IMPLANT
BNDG ELASTIC 6X10 VLCR STRL LF (GAUZE/BANDAGES/DRESSINGS) IMPLANT
BNDG ELASTIC 6X5.8 VLCR STR LF (GAUZE/BANDAGES/DRESSINGS) ×3 IMPLANT
BNDG ESMARK 4X9 LF (GAUZE/BANDAGES/DRESSINGS) IMPLANT
BNDG ESMARK 6X9 LF (GAUZE/BANDAGES/DRESSINGS)
BNDG GAUZE ELAST 4 BULKY (GAUZE/BANDAGES/DRESSINGS) ×3 IMPLANT
CANISTER WOUNDNEG PRESSURE 500 (CANNISTER) ×3 IMPLANT
COVER SURGICAL LIGHT HANDLE (MISCELLANEOUS) ×6 IMPLANT
CUFF TOURN SGL QUICK 34 (TOURNIQUET CUFF)
CUFF TRNQT CYL 34X4.125X (TOURNIQUET CUFF) IMPLANT
DRAPE DERMATAC (DRAPES) ×3 IMPLANT
DRAPE U-SHAPE 47X51 STRL (DRAPES) ×3 IMPLANT
DRSG ADAPTIC 3X8 NADH LF (GAUZE/BANDAGES/DRESSINGS) ×3 IMPLANT
DRSG PAD ABDOMINAL 8X10 ST (GAUZE/BANDAGES/DRESSINGS) IMPLANT
DRSG VAC ATS SM SENSATRAC (GAUZE/BANDAGES/DRESSINGS) ×3 IMPLANT
DRSG XEROFORM 1X8 (GAUZE/BANDAGES/DRESSINGS) IMPLANT
DURAPREP 26ML APPLICATOR (WOUND CARE) ×3 IMPLANT
ELECT REM PT RETURN 9FT ADLT (ELECTROSURGICAL) ×3
ELECTRODE REM PT RTRN 9FT ADLT (ELECTROSURGICAL) ×2 IMPLANT
GAUZE SPONGE 4X4 12PLY STRL (GAUZE/BANDAGES/DRESSINGS) IMPLANT
GAUZE SPONGE 4X4 12PLY STRL LF (GAUZE/BANDAGES/DRESSINGS) IMPLANT
GAUZE XEROFORM 1X8 LF (GAUZE/BANDAGES/DRESSINGS) IMPLANT
GLOVE SURG ENC TEXT LTX SZ7.5 (GLOVE) ×3 IMPLANT
GLOVE SURG UNDER LTX SZ8 (GLOVE) ×3 IMPLANT
GOWN STRL REUS W/ TWL LRG LVL3 (GOWN DISPOSABLE) ×2 IMPLANT
GOWN STRL REUS W/ TWL XL LVL3 (GOWN DISPOSABLE) ×2 IMPLANT
GOWN STRL REUS W/TWL LRG LVL3 (GOWN DISPOSABLE) ×3
GOWN STRL REUS W/TWL XL LVL3 (GOWN DISPOSABLE) ×3
HANDPIECE INTERPULSE COAX TIP (DISPOSABLE)
KIT BASIN OR (CUSTOM PROCEDURE TRAY) ×3 IMPLANT
KIT TURNOVER KIT B (KITS) ×3 IMPLANT
MANIFOLD NEPTUNE II (INSTRUMENTS) ×3 IMPLANT
NEEDLE 22X1 1/2 (OR ONLY) (NEEDLE) IMPLANT
NS IRRIG 1000ML POUR BTL (IV SOLUTION) ×3 IMPLANT
PACK ORTHO EXTREMITY (CUSTOM PROCEDURE TRAY) ×3 IMPLANT
PAD ARMBOARD 7.5X6 YLW CONV (MISCELLANEOUS) ×6 IMPLANT
PAD CAST 4YDX4 CTTN HI CHSV (CAST SUPPLIES) IMPLANT
PADDING CAST COTTON 4X4 STRL (CAST SUPPLIES)
SET CYSTO W/LG BORE CLAMP LF (SET/KITS/TRAYS/PACK) ×3 IMPLANT
SET HNDPC FAN SPRY TIP SCT (DISPOSABLE) IMPLANT
SPONGE T-LAP 18X18 ~~LOC~~+RFID (SPONGE) IMPLANT
STOCKINETTE IMPERVIOUS 9X36 MD (GAUZE/BANDAGES/DRESSINGS) IMPLANT
SUT ETHILON 2 0 FS 18 (SUTURE) ×3 IMPLANT
SUT ETHILON 2 0 PSLX (SUTURE) ×3 IMPLANT
SYR CONTROL 10ML LL (SYRINGE) IMPLANT
TOWEL GREEN STERILE (TOWEL DISPOSABLE) ×3 IMPLANT
TUBE CONNECTING 12X1/4 (SUCTIONS) ×3 IMPLANT
TUBE CONNECTING 20X1/4 (TUBING) IMPLANT
UNDERPAD 30X36 HEAVY ABSORB (UNDERPADS AND DIAPERS) ×3 IMPLANT
YANKAUER SUCT BULB TIP NO VENT (SUCTIONS) ×3 IMPLANT

## 2021-04-12 NOTE — Progress Notes (Signed)
[facility] PROGRESS NOTE    Progress Note  Sherri Dixon  TMA:[id number] DOB: 07/08/1981 DOA: 04/05/2021 PCP: Tomasa Hose, NP     Brief Narrative:   Sherri Dixon is an 40 y.o. female past medical history of asthma, chronic diastolic heart failure, essential hypertension diabetes mellitus insulin-dependent, microcytic anemia history of PE on Xarelto comes in for right foot pain and swelling after stepping on a piece of glass about a week ago.  Was found to have a necrotic infected foot orthopedic surgery was consulted she was taken to the OR with irrigation and debridement and wound VAC placement.  Antibiotics: 04/05/2021 IV Zosyn 04/05/2021 IV clindamycin 04/07/2021 IV daptomycin  Microbiology data: Blood culture: Negative till date  Procedures: None  Assessment/Plan:   Sepsis due to necrotizing Right foot infection I&D performed on 04/06/2021.  With significant necrotizing tissue and op note wound VAC in place. Now on only IV Zosyn. Blood cultures from 04/06/2021 have remained negative till date. Wound culture from OR grew Streptococcus gallolyticus and Proteus  Orthopedic surgery she is scheduled for 04/12/2021 for intervention and probable amputation ID on board appreciate assistance.  Acute kidney injury: In the setting of sepsis and ARB use.  With a baseline creatinine of 1.1-1.3 Creatinine trending down with fluid hydration.  Urinary retention: Appears to be resolved.  Hypokalemia: Replete orally now resolved.  Microcytic anemia: In a menstruating female with heavy periods on Xarelto.  She is now on IV heparin. She status post 2 units of packed red blood cells her hemoglobin has been stable.  History of PE: Xarelto has been held for surgical procedure, currently on IV heparin.  Chronic diastolic heart failure: Holding Lasix volume status is appears euvolemic on physical exam.  Hyperlipidemia:  continue Crestor.  Diabetes mellitus type 2: With an A1c  of 8.9, good control blood glucose, will hold Lantus tonight.  History of PE/history of CVA: Currently on a heparin drip, Xarelto has been held . Will need to hold IV heparin 4 hours prior to procedure.  Essential hypertension: ARB Held due to acute kidney injury. Continue metoprolol and hydralazine.   DVT prophylaxis: heparin Family Communication:husband Status is: Inpatient  Remains inpatient appropriate because:Hemodynamically unstable  Dispo: The patient is from: Home              Anticipated d/c is to: Home              Patient currently is not medically stable to d/c.   Difficult to place patient No        Code Status:     Code Status Orders  (From admission, onward)           Start     Ordered   04/05/21 2049  Full code  Continuous        04/05/21 2051           Code Status History     Date Active Date Inactive Code Status Order ID Comments User Context   01/25/2021 1948 01/28/2021 1939 Full Code [id number]  Rise Patience, MD ED   12/12/2020 2345 12/15/2020 1717 Full Code [id number]  Rise Patience, MD ED   07/27/2019 1646 07/31/2019 2132 Full Code [id number]  Cleon Gustin, MD Inpatient   03/10/2019 1030 03/11/2019 1559 Full Code [id number]  Domenic Polite, MD ED   05/22/2018 1455 05/24/2018 1917 Full Code [id number]  Debbe Odea, MD ED   06/05/2016 1613 06/06/2016 1540 Full Code [id number]  Caren Griffins, MD  ED         IV Access:   Peripheral IV   Procedures and diagnostic studies:   No results found.   Medical Consultants:   None.   Subjective:    Sherri Dixon pain is controlled.  Objective:    Vitals:   04/11/21 0800 04/11/21 2034 04/12/21 0543 04/12/21 0740  BP: (!) 158/77 137/71 (!) 155/75 (!) 141/65  Pulse: 76 75 75 76  Resp: 17 17 16 17   Temp: 97.8 F (36.6 C) 98.5 F (36.9 C) 98 F (36.7 C) 98.3 F (36.8 C)  TempSrc: Oral Oral Oral Oral  SpO2: 100% 100% 100% 98%  Weight:      Height:       SpO2:  98 %   Intake/Output Summary (Last 24 hours) at 04/12/2021 0933 Last data filed at 04/12/2021 0543 Gross per 24 hour  Intake 124.24 ml  Output 1110 ml  Net -985.76 ml    Filed Weights   04/06/21 1108 04/08/21 0500 04/09/21 0451  Weight: (!) 154.2 kg (!) 139.2 kg (!) 139 kg    Exam: General exam: In no acute distress. Respiratory system: Good air movement and clear to auscultation. Cardiovascular system: S1 & S2 heard, RRR. No JVD. Gastrointestinal system: Abdomen is nondistended, soft and nontender.  Extremities: No pedal edema. Skin: No rashes, lesions or ulcers  Data Reviewed:    Labs: Basic Metabolic Panel: Recent Labs  Lab 04/06/21 0526 04/07/21 0123 04/08/21 1246 04/09/21 0056 04/10/21 0243 04/11/21 0022 04/12/21 0217  NA 135   < > 136 134* 136 134* 136  K 3.3*   < > 3.4* 3.7 3.7 3.8 3.8  CL 104   < > 105 106 107 105 106  CO2 25   < > 25 23 22 22 25   GLUCOSE 144*   < > 80 118* 146* 92 140*  BUN 19   < > 23* 24* 21* 19 17  CREATININE 1.65*   < > 1.89* 1.81* 1.64* 1.43* 1.34*  CALCIUM 7.5*   < > 7.7* 7.5* 7.3* 7.1* 7.6*  MG 1.8  --   --   --   --   --   --    < > = values in this interval not displayed.    GFR Estimated Creatinine Clearance: 74 mL/min (A) (by C-G formula based on SCr of 1.34 mg/dL (H)). Liver Function Tests: Recent Labs  Lab 04/05/21 1722  AST 16  ALT 18  ALKPHOS 266*  BILITOT 0.4  PROT 5.9*  ALBUMIN <1.0*    No results for input(s): LIPASE, AMYLASE in the last 168 hours. No results for input(s): AMMONIA in the last 168 hours. Coagulation profile Recent Labs  Lab 04/05/21 1722  INR 1.0    COVID-19 Labs  No results for input(s): DDIMER, FERRITIN, LDH, CRP in the last 72 hours.   Lab Results  Component Value Date   SARSCOV2NAA NEGATIVE 04/05/2021   [place] NEGATIVE 01/25/2021   [place] NEGATIVE 12/12/2020   SARSCOV2NAA NOT DETECTED 07/25/2019    CBC: Recent Labs  Lab 04/05/21 1722 04/06/21 0303  04/08/21 1246 04/09/21 0056 04/10/21 0243 04/10/21 1541 04/11/21 0022 04/12/21 0217  WBC 26.2*   < > 15.6* 14.1* 18.4*  --  22.8* 18.9*  NEUTROABS 24.1*  --   --   --   --   --   --   --   HGB 8.6*   < > 7.8* 7.3* 6.3* 11.2* 7.4* 7.4*  HCT 27.7*   < >  24.7* 23.4* 20.1* 34.6* 23.5* 22.7*  MCV 86.3   < > 84.0 84.5 85.5  --  85.8 84.4  PLT 546*   < > 426* 359 284  --  289 274   < > = values in this interval not displayed.    Cardiac Enzymes: Recent Labs  Lab 04/07/21 1005 04/08/21 1246  CKTOTAL 30* 22*    BNP (last 3 results) No results for input(s): PROBNP in the last 8760 hours. CBG: Recent Labs  Lab 04/11/21 0829 04/11/21 1216 04/11/21 1723 04/11/21 2127 04/12/21 0742  GLUCAP 92 109* 96 110* 138*    D-Dimer: No results for input(s): DDIMER in the last 72 hours. Hgb A1c: No results for input(s): HGBA1C in the last 72 hours.  Lipid Profile: No results for input(s): CHOL, HDL, LDLCALC, TRIG, CHOLHDL, LDLDIRECT in the last 72 hours. Thyroid function studies: No results for input(s): TSH, T4TOTAL, T3FREE, THYROIDAB in the last 72 hours.  Invalid input(s): FREET3 Anemia work up: No results for input(s): VITAMINB12, FOLATE, FERRITIN, TIBC, IRON, RETICCTPCT in the last 72 hours. Sepsis Labs: Recent Labs  Lab 04/05/21 1722 04/05/21 1839 04/06/21 0303 04/09/21 0056 04/10/21 0243 04/11/21 0022 04/12/21 0217  WBC 26.2*  --    < > 14.1* 18.4* 22.8* 18.9*  LATICACIDVEN 1.6 1.4  --   --   --   --   --    < > = values in this interval not displayed.    Microbiology Recent Results (from the past 240 hour(s))  Resp Panel by RT-PCR (Flu A&B, Covid) Nasopharyngeal Swab     Status: None   Collection Time: 04/05/21  4:40 PM   Specimen: Nasopharyngeal Swab; Nasopharyngeal(NP) swabs in vial transport medium  Result Value Ref Range Status   SARS Coronavirus 2 by RT PCR NEGATIVE NEGATIVE Final    Comment: (NOTE) SARS-CoV-2 target nucleic acids are NOT DETECTED.  The  SARS-CoV-2 RNA is generally detectable in upper respiratory specimens during the acute phase of infection. The lowest concentration of SARS-CoV-2 viral copies this assay can detect is 138 copies/mL. A negative result does not preclude SARS-Cov-2 infection and should not be used as the sole basis for treatment or other patient management decisions. A negative result may occur with  improper specimen collection/handling, submission of specimen other than nasopharyngeal swab, presence of viral mutation(s) within the areas targeted by this assay, and inadequate number of viral copies(<138 copies/mL). A negative result must be combined with clinical observations, patient history, and epidemiological information. The expected result is Negative.  Fact Sheet for Patients:  EntrepreneurPulse.com.au  Fact Sheet for Healthcare Providers:  IncredibleEmployment.be  This test is no t yet approved or cleared by the [place] FDA and  has been authorized for detection and/or diagnosis of SARS-CoV-2 by FDA under an Emergency Use Authorization (EUA). This EUA will remain  in effect (meaning this test can be used) for the duration of the COVID-19 declaration under Section 564(b)(1) of the Act, 21 U.S.C.section 360bbb-3(b)(1), unless the authorization is terminated  or revoked sooner.       Influenza A by PCR NEGATIVE NEGATIVE Final   Influenza B by PCR NEGATIVE NEGATIVE Final    Comment: (NOTE) The Xpert Xpress SARS-CoV-2/FLU/RSV plus assay is intended as an aid in the diagnosis of influenza from Nasopharyngeal swab specimens and should not be used as a sole basis for treatment. Nasal washings and aspirates are unacceptable for Xpert Xpress SARS-CoV-2/FLU/RSV testing.  Fact Sheet for Patients: EntrepreneurPulse.com.au  Fact Sheet for  Healthcare Providers: IncredibleEmployment.be  This test is not yet approved or  cleared by the [place] and has been authorized for detection and/or diagnosis of SARS-CoV-2 by FDA under an Emergency Use Authorization (EUA). This EUA will remain in effect (meaning this test can be used) for the duration of the COVID-19 declaration under Section 564(b)(1) of the Act, 21 U.S.C. section 360bbb-3(b)(1), unless the authorization is terminated or revoked.  Performed at [facility], [place] [address], [place], [place] [address]   Blood Culture (routine x 2)     Status: None   Collection Time: 04/05/21  5:23 PM   Specimen: BLOOD  Result Value Ref Range Status   Specimen Description   Final    BLOOD BLOOD RIGHT ARM Performed at [place] [address] [place], [place] [address]    Special Requests   Final    BOTTLES DRAWN AEROBIC ONLY Blood Culture adequate volume Performed at [place] [address], [place], [place] [address]    Culture   Final    NO GROWTH 5 DAYS Performed at [facility], [place] [address], [place], [place] [address]    Report Status 04/10/2021 FINAL  Final  Blood Culture (routine x 2)     Status: None   Collection Time: 04/05/21  7:30 PM   Specimen: BLOOD  Result Value Ref Range Status   Specimen Description   Final    BLOOD RIGHT ANTECUBITAL Performed at [place] [address], [place], [place] [address]    Special Requests   Final    BOTTLES DRAWN AEROBIC AND ANAEROBIC Blood Culture adequate volume Performed at [place] [address], [place], [place] [address]    Culture   Final    NO GROWTH 5 DAYS Performed at [facility], [place] [address], [place], [place] [address]    Report Status 04/10/2021 FINAL  Final  Surgical pcr screen     Status: Abnormal   Collection Time: 04/06/21  8:55 AM   Specimen: Nasal Mucosa; Nasal Swab  Result Value Ref Range Status   MRSA, PCR NEGATIVE NEGATIVE Final    Staphylococcus aureus POSITIVE (A) NEGATIVE Final    Comment: (NOTE) The Xpert SA Assay (FDA approved for NASAL specimens in patients 22 years of age and older), is one component of a comprehensive surveillance program. It is not intended to diagnose infection nor to guide or monitor treatment. Performed at [facility], [place] [address], [place],  [address]   Aerobic/Anaerobic Culture w Gram Stain (surgical/deep wound)     Status: None   Collection Time: 04/06/21 11:44 AM   Specimen: Abscess  Result Value Ref Range Status   Specimen Description ABSCESS  Final   Special Requests RIGHT FOOT ABSCESS SPEC A  Final   Gram Stain   Final    NO WBC SEEN MODERATE GRAM POSITIVE COCCI IN PAIRS AND CHAINS IN TETRADS MODERATE GRAM NEGATIVE RODS FEW GRAM POSITIVE RODS    Culture   Final    ABUNDANT PROTEUS MIRABILIS MODERATE STREPTOCOCCUS GALLOLYTICUS NO ANAEROBES ISOLATED Performed at [facility], [place] [address], [place],  [address]    Report Status 04/11/2021 FINAL  Final   Organism ID, Bacteria PROTEUS MIRABILIS  Final   Organism ID, Bacteria STREPTOCOCCUS GALLOLYTICUS  Final      Susceptibility   Proteus mirabilis - MIC*    AMPICILLIN <=2 SENSITIVE Sensitive  CEFAZOLIN 8 SENSITIVE Sensitive     CEFEPIME <=0.12 SENSITIVE Sensitive     CEFTAZIDIME <=1 SENSITIVE Sensitive     CEFTRIAXONE <=0.25 SENSITIVE Sensitive     CIPROFLOXACIN <=0.25 SENSITIVE Sensitive     GENTAMICIN <=1 SENSITIVE Sensitive     IMIPENEM 4 SENSITIVE Sensitive     TRIMETH/SULFA <=20 SENSITIVE Sensitive     AMPICILLIN/SULBACTAM <=2 SENSITIVE Sensitive     PIP/TAZO <=4 SENSITIVE Sensitive     * ABUNDANT PROTEUS MIRABILIS   Streptococcus gallolyticus - MIC*    PENICILLIN 0.12 SENSITIVE Sensitive     CEFTRIAXONE 0.25 SENSITIVE Sensitive     ERYTHROMYCIN >=8 RESISTANT Resistant     LEVOFLOXACIN 4 INTERMEDIATE Intermediate     VANCOMYCIN 0.25 SENSITIVE Sensitive     * MODERATE  STREPTOCOCCUS GALLOLYTICUS     Medications:    sodium chloride   Intravenous Once   hydrALAZINE  50 mg Oral TID   insulin aspart  0-9 Units Subcutaneous TID WC   metoprolol succinate  100 mg Oral Daily   pantoprazole  40 mg Oral BID   polyethylene glycol  17 g Oral BID   Continuous Infusions:  ampicillin-sulbactam (UNASYN) IV 3 g (04/12/21 6808)   heparin 1,400 Units/hr (04/12/21 0749)      LOS: 7 days   Charlynne Cousins  [facility]  04/12/2021, 9:33 AM

## 2021-04-12 NOTE — Progress Notes (Signed)
Diagnosis: DFU/Osteomyelitis   Culture Result: Proteus mirabilis, streptococcus gallolyticus  Allergies  Allergen Reactions   Amlodipine Swelling   Humalog Kwikpen [Insulin Lispro] Other (See Comments)    75/50---swelling and ulcers in mouth     OPAT Orders Discharge antibiotics to be given via PICC line Discharge antibiotics: ceftriaxone 2 g iv daily  Per pharmacy protocol  Aim for Vancomycin trough 15-20 or AUC 400-550 (unless otherwise indicated) Duration: 6 weeks  End Date: 05/24/21  PIC Care Per Protocol:  Home health RN for IV administration and teaching; PICC line care and labs.    Labs weekly while on IV antibiotics: X_ CBC with differential __ BMP X__ CMP X__ CRP X__ ESR __ Vancomycin trough __ CK  X__ Please pull PIC at completion of IV antibiotics __ Please leave PIC in place until doctor has seen patient or been notified  Fax weekly labs to [phone]  Clinic Follow Up Appt: 05/23/21 @ 1:45 pm with myself  Sabina Manandhar, MD Infectious Disease Physician [place]  [facility] [address] [place], [place] [address] Phone: [phone]  Fax: [phone]  

## 2021-04-12 NOTE — Op Note (Addendum)
Sherri Dixon female 40 y.o. 04/12/2021  PreOperative Diagnosis: Right foot necrotizing forefoot infection  PostOperative Diagnosis: Same  PROCEDURE: Right foot transmetatarsal amputation Wound VAC placement  SURGEON: Melony Overly, MD  ASSISTANT: Jesse Martinique, PA-S  ANESTHESIA: General  FINDINGS: Necrotizing forefoot infection  IMPLANTS: None  INDICATIONS:40 y.o. female presented to the emergency department with soft tissue infection of her forefoot.  X-rays revealed gas within the soft tissues.  She was initially taken back for I&D and wound VAC placement.  She had dysvascular middle toe due to the necrotizing infection as well as continued soft tissue breakdown despite antibiotic treatment.  She was found on culture to have a polymicrobial infection.  Given that she was indicated for transmetatarsal amputation.   Patient understood the risks, benefits and alternatives to surgery which include but are not limited to wound healing complications, infection, need for further surgery as well as damage to surrounding structures. They also understood the potential for continued pain in that there were no guarantees of acceptable outcome After weighing these risks the patient opted to proceed with surgery.  PROCEDURE: Patient was identified in the preoperative holding area.  The right foot was marked by myself.  Consent was signed by myself and the patient. Patient was taken to the operative suite and placed supine on the operative table.  General LMA anesthesia was induced without difficulty. Bump was placed under the operative hip and bone foam was used.  All bony prominences were well padded.    She was on preoperative antibiotics.  We began by removing the VAC.  The third toe was avascular.  There was continued disruption of the soft tissues within the forefoot and within the remaining webspaces.  There was purulent drainage from the wounds distally.  Given this she was  indicated for transmetatarsal amputation.  Surgical timeout was performed.  We began by making an incision along the dorsal soft tissue at the demarcation of the viable and nonviable skin.  It was taken sharply down to bone.  Was taken down across the medial lateral portion of the foot and then more proximal along the plantar surface of the foot given the underlying necrotic tissue within the plantar foot.  This was taken back to about the level of the transverse midfoot.  The soft tissue was dissected off the bone.  There was no obvious bony destruction of the metatarsals.  The flaps were created dorsally and plantarly as well as medially and laterally about the metatarsals.  Then a sagittal saw was used to cut the metatarsals just distal to the tarsometatarsal joints.  The forefoot was then removed and the remaining soft tissue and passed off the table to send pathology.  Then the soft tissues were inspected.  There was residual necrotic tissue plantarly.  This was removed sharply with a 15 blade and and discarded.  There is also a continued abscess cavity on the plantar and dorsal portion of the soft tissue in this area but the tissue appeared well vascularized and the area was irrigated copiously with normal saline.  Approximately 3000 cc using cystoscopy tubing was used.  Then vancomycin powder was placed within the remaining foot.  The skin was then closed using 2-0 nylon suture.  This was done in a tension-free fashion.  Then and wound VAC was placed.  Using a single sponge and Adaptic between the sponge and the skin a wound VAC was placed over the incision down to the plantar prior incision site about the hindfoot.  Wound VAC was placed and there was good suction after placement of the VAC.  The wound VAC was set at 225 mmHg with continuous suction.  Ace wrap was placed.  She was placed back on the hospital bed.  She was awakened from anesthesia and taken recovery in stable condition.  There were no  complications.    POST OPERATIVE INSTRUCTIONS: Nonweightbearing to operative foot Keep VAC in place Will change VAC in 3 to 5 days.  TOURNIQUET TIME: No tourniquet  BLOOD LOSS:  less than 50 mL         DRAINS: none         SPECIMEN: none       COMPLICATIONS:  * No complications entered in OR log *         Disposition: PACU - hemodynamically stable.         Condition: stable

## 2021-04-12 NOTE — Transfer of Care (Signed)
Immediate Anesthesia Transfer of Care Note  Patient: Sherri Dixon  Procedure(s) Performed: TRANSMETATARSAL AMPUTATION (Right: Toe) IRRIGATION AND DEBRIDEMENT RIGHT FOOT (Right: Foot) APPLICATION OF WOUND VAC (Right: Foot)  Patient Location: PACU  Anesthesia Type:General  Level of Consciousness: awake, alert , oriented and patient cooperative  Airway & Oxygen Therapy: Patient Spontanous Breathing and Patient connected to nasal cannula oxygen  Post-op Assessment: Report given to RN, Post -op Vital signs reviewed and stable and Patient moving all extremities  Post vital signs: Reviewed and stable  Last Vitals:  Vitals Value Taken Time  BP    Temp 36.7 C 04/12/21 1630  Pulse    Resp    SpO2      Last Pain:  Vitals:   04/12/21 1409  TempSrc: Oral  PainSc: 0-No pain      Patients Stated Pain Goal: 0 (38/25/05 3976)  Complications: No notable events documented.

## 2021-04-12 NOTE — Anesthesia Preprocedure Evaluation (Signed)
Anesthesia Evaluation  Patient identified by MRN, date of birth, ID band Patient awake    Reviewed: Allergy & Precautions, NPO status , Patient's Chart, lab work & pertinent test results  Airway Mallampati: II  TM Distance: >3 FB Neck ROM: Full    Dental no notable dental hx.    Pulmonary neg pulmonary ROS,    Pulmonary exam normal breath sounds clear to auscultation + decreased breath sounds      Cardiovascular hypertension, Normal cardiovascular exam Rhythm:Regular Rate:Normal     Neuro/Psych CVA negative psych ROS   GI/Hepatic negative GI ROS, Neg liver ROS,   Endo/Other  diabetes, Insulin DependentMorbid obesity  Renal/GU Renal InsufficiencyRenal disease  negative genitourinary   Musculoskeletal negative musculoskeletal ROS (+)   Abdominal (+) + obese,   Peds negative pediatric ROS (+)  Hematology  (+) anemia ,   Anesthesia Other Findings   Reproductive/Obstetrics negative OB ROS                             Anesthesia Physical Anesthesia Plan  ASA: 3  Anesthesia Plan: General   Post-op Pain Management:    Induction: Intravenous  PONV Risk Score and Plan: 3 and Ondansetron, Dexamethasone and Treatment may vary due to age or medical condition  Airway Management Planned: LMA  Additional Equipment:   Intra-op Plan:   Post-operative Plan: Extubation in OR  Informed Consent: I have reviewed the patients History and Physical, chart, labs and discussed the procedure including the risks, benefits and alternatives for the proposed anesthesia with the patient or authorized representative who has indicated his/her understanding and acceptance.     Dental advisory given  Plan Discussed with: CRNA and Surgeon  Anesthesia Plan Comments:         Anesthesia Quick Evaluation

## 2021-04-12 NOTE — Progress Notes (Addendum)
ANTICOAGULATION CONSULT NOTE  Pharmacy Consult:  Heparin  Indication:  History of PE   Allergies  Allergen Reactions   Amlodipine Swelling   Humalog Kwikpen [Insulin Lispro] Other (See Comments)    75/50---swelling and ulcers in mouth     Patient Measurements: Height: 5\' 2"  (157.5 cm) Weight: (!) 139 kg (306 lb 7 oz) IBW/kg (Calculated) : 50.1 Heparin Dosing Weight: 86 kg  Vital Signs: Temp: 97.8 F (36.6 C) (07/15 1759) Temp Source: Oral (07/15 1759) BP: 102/66 (07/15 1759) Pulse Rate: 58 (07/15 1759)  Labs: Recent Labs    04/10/21 0243 04/10/21 1541 04/11/21 0022 04/12/21 0217  HGB 6.3* 11.2* 7.4* 7.4*  HCT 20.1* 34.6* 23.5* 22.7*  PLT 284  --  289 274  HEPARINUNFRC 0.50  --  0.41 0.62  CREATININE 1.64*  --  1.43* 1.34*     Estimated Creatinine Clearance: 74 mL/min (A) (by C-G formula based on SCr of 1.34 mg/dL (H)).   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  Pharmacy also consulted to bridge with IV heparin.    Heparin level trended up to 0.62 units/mL.  She is on her menstrual cycle; no bleeding reported.  2nd: she is post-op R TMA with wound VAC placement. Clarified with Dr Lucia Gaskins that it is ok to resume IV heparin post-op tomorrow morning.  Goal of Therapy:  Heparin level 0.3-0.7 units/ml, aim for low goal with anemia Monitor platelets by anticoagulation protocol: Yes   Plan:  Resume heparin infusion at 1400 units/hr tomorrow morning 8h heparin level Daily heparin level and CBC Monitor for s/sx of bleeding  Thank you for involving pharmacy in this patient's care.  Renold Genta, PharmD, BCPS Clinical Pharmacist Clinical phone for 04/12/2021 until 10p is [phone] 04/12/2021 6:26 PM  **Pharmacist phone directory can be found on [email or web address] listed under [place]**

## 2021-04-12 NOTE — Progress Notes (Signed)
[facility] Infectious Diseases Follow Up Note  Patient Identification: Patient Name: Sherri Dixon MRN: [id number] [place] Date: 04/05/2021  4:27 PM Age: 40 y.o.Today's Date: 04/12/2021   Reason for Visit: Osteomyelitis  Principal Problem:   Right foot infection Active Problems:   Hypokalemia   Sepsis ([place])   Diabetes ([place])   AKI (acute kidney injury) ([place])   Antibiotics:  Vancomycin 7/8-7/9, daptomycin 7/10-current                    Zosyn 7/8-7/13, Unasyn 7/14-current                     Clindamycin 7/8-7/11  Lines/Tubes: Wound VAC in right foot, PIV's  Interval Events: Continues to be afebrile, WBC downtrending to 18.9   Assessment 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 OR cultures growing Proteus mirabilis and streptococcus gallolyticus.  S/p TMA on 7/15. Surgical pathology sent  Blood cx 7/8 NG 2/2 sets  ABI Lower extremities WNL   AKI on CKD - Cr is downtrending  IDDM Obesity  Recommendations Recommend to change Unasyn to ceftriaxone 2g iv daily and Metronidazole 500 mg PO BID for ease of dosing at home Discussed with Dr Lucia Gaskins, no more surgical interventions planned currently and plan to follow up OP for vac change Will need a PICC line for long term IV antibiotics Plan for ceftriaxone and metronidazole for 6 weeks from 7/15. Tentative end date is 05/24/21 Will place OPAT Orders and make a follow up in [facility] ID will sign off for now. Please call with questions.   Rest of the management as per the primary team. Thank you for the consult. Please page with pertinent questions or concerns.  ______________________________________________________________________ Subjective Patient off the floor to OR  Vitals BP 102/66   Pulse (!) 58   Temp 97.8 F (36.6 C) (Oral)   Resp 18   Ht 5\' 2"  (1.575 m)   Wt (!) 139 kg    LMP 04/05/2021 (Exact Date)   SpO2 100%   BMI 56.05 kg/m   Pertinent Microbiology Results for orders placed or performed during the hospital encounter of 04/05/21  Resp Panel by RT-PCR (Flu A&B, Covid) Nasopharyngeal Swab     Status: None   Collection Time: 04/05/21  4:40 PM   Specimen: Nasopharyngeal Swab; Nasopharyngeal(NP) swabs in vial transport medium  Result Value Ref Range Status   SARS Coronavirus 2 by RT PCR NEGATIVE NEGATIVE Final    Comment: (NOTE) SARS-CoV-2 target nucleic acids are NOT DETECTED.  The SARS-CoV-2 RNA is generally detectable in upper respiratory specimens during the acute phase of infection. The lowest concentration of SARS-CoV-2 viral copies this assay can detect is 138 copies/mL. A negative result does not preclude SARS-Cov-2 infection and should not be used as the sole basis for treatment or other patient management decisions. A negative result may occur with  improper specimen collection/handling, submission of specimen other than nasopharyngeal swab, presence of viral mutation(s) within the areas targeted by this assay, and inadequate number of viral copies(<138 copies/mL). A negative result must be combined with clinical observations, patient history, and epidemiological information. The expected result is Negative.  Fact Sheet for Patients:  EntrepreneurPulse.com.au  Fact Sheet for Healthcare Providers:  IncredibleEmployment.be  This test is no t yet approved or cleared by the [place] FDA and  has been authorized for detection and/or diagnosis of SARS-CoV-2 by FDA  under an Emergency Use Authorization (EUA). This EUA will remain  in effect (meaning this test can be used) for the duration of the COVID-19 declaration under Section 564(b)(1) of the Act, 21 U.S.C.section 360bbb-3(b)(1), unless the authorization is terminated  or revoked sooner.       Influenza A by PCR NEGATIVE NEGATIVE Final    Influenza B by PCR NEGATIVE NEGATIVE Final    Comment: (NOTE) The Xpert Xpress SARS-CoV-2/FLU/RSV plus assay is intended as an aid in the diagnosis of influenza from Nasopharyngeal swab specimens and should not be used as a sole basis for treatment. Nasal washings and aspirates are unacceptable for Xpert Xpress SARS-CoV-2/FLU/RSV testing.  Fact Sheet for Patients: EntrepreneurPulse.com.au  Fact Sheet for Healthcare Providers: IncredibleEmployment.be  This test is not yet approved or cleared by the [place] FDA and has been authorized for detection and/or diagnosis of SARS-CoV-2 by FDA under an Emergency Use Authorization (EUA). This EUA will remain in effect (meaning this test can be used) for the duration of the COVID-19 declaration under Section 564(b)(1) of the Act, 21 U.S.C. section 360bbb-3(b)(1), unless the authorization is terminated or revoked.  Performed at [facility], [place] [address], [place], [place] [address]   Blood Culture (routine x 2)     Status: None   Collection Time: 04/05/21  5:23 PM   Specimen: BLOOD  Result Value Ref Range Status   Specimen Description   Final    BLOOD BLOOD RIGHT ARM Performed at [facility] [address] [place], [place] [address]    Special Requests   Final    BOTTLES DRAWN AEROBIC ONLY Blood Culture adequate volume Performed at [facility] [address] [place], [place] [address]    Culture   Final    NO GROWTH 5 DAYS Performed at [facility], [place] [address] [place], [place] [address]    Report Status 04/10/2021 FINAL  Final  Blood Culture (routine x 2)     Status: None   Collection Time: 04/05/21  7:30 PM   Specimen: BLOOD  Result Value Ref Range Status   Specimen Description   Final    BLOOD RIGHT ANTECUBITAL Performed at [facility] [address] [place], [place] [address]    Special  Requests   Final    BOTTLES DRAWN AEROBIC AND ANAEROBIC Blood Culture adequate volume Performed at [facility] [address], [place], [place] [address]    Culture   Final    NO GROWTH 5 DAYS Performed at [facility], [place] [address], [place], [place] [address]    Report Status 04/10/2021 FINAL  Final  Surgical pcr screen     Status: Abnormal   Collection Time: 04/06/21  8:55 AM   Specimen: Nasal Mucosa; Nasal Swab  Result Value Ref Range Status   MRSA, PCR NEGATIVE NEGATIVE Final   Staphylococcus aureus POSITIVE (A) NEGATIVE Final    Comment: (NOTE) The Xpert SA Assay (FDA approved for NASAL specimens in patients 63 years of age and older), is one component of a comprehensive surveillance program. It is not intended to diagnose infection nor to guide or monitor treatment. Performed at [facility], [place] [address] [place],  [address]   Aerobic/Anaerobic Culture w Gram Stain (surgical/deep wound)     Status: None   Collection Time: 04/06/21 11:44 AM   Specimen: Abscess  Result Value Ref Range Status   Specimen Description ABSCESS  Final   Special Requests RIGHT FOOT ABSCESS SPEC A  Final   Gram Stain   Final    NO WBC SEEN MODERATE GRAM POSITIVE COCCI IN PAIRS AND CHAINS IN TETRADS MODERATE GRAM NEGATIVE RODS FEW GRAM POSITIVE RODS    Culture   Final    ABUNDANT PROTEUS MIRABILIS MODERATE STREPTOCOCCUS GALLOLYTICUS NO ANAEROBES ISOLATED Performed at [facility], [place] [address] [place], [place] [address]    Report Status 04/11/2021 FINAL  Final   Organism ID, Bacteria PROTEUS MIRABILIS  Final   Organism ID, Bacteria STREPTOCOCCUS GALLOLYTICUS  Final      Susceptibility   Proteus mirabilis - MIC*    AMPICILLIN <=2 SENSITIVE Sensitive     CEFAZOLIN 8 SENSITIVE Sensitive     CEFEPIME <=0.12 SENSITIVE Sensitive     CEFTAZIDIME <=1 SENSITIVE Sensitive     CEFTRIAXONE <=0.25 SENSITIVE Sensitive     CIPROFLOXACIN <=0.25  SENSITIVE Sensitive     GENTAMICIN <=1 SENSITIVE Sensitive     IMIPENEM 4 SENSITIVE Sensitive     TRIMETH/SULFA <=20 SENSITIVE Sensitive     AMPICILLIN/SULBACTAM <=2 SENSITIVE Sensitive     PIP/TAZO <=4 SENSITIVE Sensitive     * ABUNDANT PROTEUS MIRABILIS   Streptococcus gallolyticus - MIC*    PENICILLIN 0.12 SENSITIVE Sensitive     CEFTRIAXONE 0.25 SENSITIVE Sensitive     ERYTHROMYCIN >=8 RESISTANT Resistant     LEVOFLOXACIN 4 INTERMEDIATE Intermediate     VANCOMYCIN 0.25 SENSITIVE Sensitive     * MODERATE STREPTOCOCCUS GALLOLYTICUS   Pertinent Lab. CBC Latest Ref Rng & Units 04/12/2021 04/11/2021 04/10/2021  WBC 4.0 - 10.5 K/uL 18.9(H) 22.8(H) -  Hemoglobin 12.0 - 15.0 g/dL 7.4(L) 7.4(L) 11.2(L)  Hematocrit 36.0 - 46.0 % 22.7(L) 23.5(L) 34.6(L)  Platelets 150 - 400 K/uL 274 289 -     Pertinent Imaging today Plain films and CT images have been personally visualized and interpreted; radiology reports have been reviewed. Decision making incorporated into the Impression / Recommendations.  US renal 04/07/21 FINDINGS: Right Kidney:   Renal measurements: 12.8 x 5.1 x 6.7 cm = volume: 229 mL. Echogenicity within normal limits. No mass or hydronephrosis visualized. 3.0 cm simple cyst seen in the upper pole.   Left Kidney:   Renal measurements: 11.1 x 5.6 x 6.5 cm = volume: 212 mL. Echogenicity within normal limits. No mass or hydronephrosis visualized.   Bladder:   Appears normal for degree of bladder distention.   Other:   None.   IMPRESSION: No significant sonographic abnormality of the kidneys.   Rt Foot Xray 04/05/21 FINDINGS: Vascular calcifications. No fracture or malalignment. No radiopaque foreign body. Diffuse soft tissue swelling. Fairly extensive gas within the soft tissues over the dorsum and plantar aspect of the foot, centered over the second third and fourth distal digits.  IMPRESSION: Edema with fairly extensive soft tissue emphysema concerning  for necrotizing infection. No acute osseous abnormality   I spent more than 35 minutes for this patient encounter including review of prior medical records, coordination of care  with greater than 50% of time being face to face/counseling and discussing diagnostics/treatment plan with the patient/family.  Electronically signed by:   Rosiland Oz, MD Infectious Disease Physician [facility] Pager: [phone]

## 2021-04-12 NOTE — Anesthesia Procedure Notes (Signed)
Procedure Name: LMA Insertion Date/Time: 04/12/2021 3:25 PM Performed by: Myna Bright, CRNA Pre-anesthesia Checklist: Patient identified, Emergency Drugs available, Suction available and Patient being monitored Patient Re-evaluated:Patient Re-evaluated prior to induction Oxygen Delivery Method: Circle system utilized Preoxygenation: Pre-oxygenation with 100% oxygen Induction Type: IV induction Ventilation: Mask ventilation without difficulty LMA: LMA inserted LMA Size: 4.0 Number of attempts: 1 Placement Confirmation: positive ETCO2 and breath sounds checked- equal and bilateral Tube secured with: Tape Dental Injury: Teeth and Oropharynx as per pre-operative assessment

## 2021-04-12 NOTE — Progress Notes (Signed)
PT Cancellation Note  Patient Details Name: Sherri Dixon MRN: [id number] DOB: [birth year]   Cancelled Treatment:    Reason Eval/Treat Not Completed: (P) Medical issues which prohibited therapy;Patient at procedure or test/unavailable (Pt to OR will f/u per POC.)   Kenna Kirn [name] 04/12/2021, 4:00 PM  Erasmo Leventhal , PTA Acute Rehabilitation Services Pager [phone] Office [phone]

## 2021-04-12 NOTE — Progress Notes (Signed)
     Sherri Dixon is a 40 y.o. female   Orthopaedic diagnosis: Right necrotizing forefoot infection status post I&D and VAC placement  Subjective: Patient is comfortable.  She is seen in the preoperative area anticipating repeat I&D, possible transmetatarsal amputation and VAC placement.  Pain is minimal.  Objectyive: Vitals:   04/12/21 1323 04/12/21 1409  BP: (!) 160/82 (!) 164/73  Pulse: 76 70  Resp: 17 16  Temp: 98.2 F (36.8 C) 97.7 F (36.5 C)  SpO2: 100% 98%     Exam: Awake and alert Respirations even and unlabored No acute distress  Right foot with wound VAC in place.  Third toe is without perfusion.  Tender to palpation of the dorsal plantar forefoot.  Assessment: Right forefoot infection with third toe vascular compromise   Plan: We will plan for inspection of her foot.  I am hopeful that transmetatarsal amputation will be amenable.  If her soft tissue looks to be healing well we may consider third toe amputation.  Likely will replace wound VAC.  She understands.  She is in agreement to proceed with surgery and understands the risks, benefits and alternatives which have been discussed with her daily.  She does know that she may ultimately end up with below the knee amputation if this does not eradicate her infection.   Radene Journey, MD

## 2021-04-12 NOTE — Progress Notes (Signed)
ANTICOAGULATION CONSULT NOTE  Pharmacy Consult:  Heparin  Indication:  History of PE   Allergies  Allergen Reactions   Amlodipine Swelling   Humalog Kwikpen [Insulin Lispro] Other (See Comments)    75/50---swelling and ulcers in mouth     Patient Measurements: Height: 5\' 2"  (157.5 cm) Weight: (!) 139 kg (306 lb 7 oz) IBW/kg (Calculated) : 50.1 Heparin Dosing Weight: 86 kg  Vital Signs: Temp: 98 F (36.7 C) (07/15 0543) Temp Source: Oral (07/15 0543) BP: 155/75 (07/15 0543) Pulse Rate: 75 (07/15 0543)  Labs: Recent Labs    04/10/21 0243 04/10/21 1541 04/11/21 0022 04/12/21 0217  HGB 6.3* 11.2* 7.4* 7.4*  HCT 20.1* 34.6* 23.5* 22.7*  PLT 284  --  289 274  HEPARINUNFRC 0.50  --  0.41 0.62  CREATININE 1.64*  --  1.43* 1.34*     Estimated Creatinine Clearance: 74 mL/min (A) (by C-G formula based on SCr of 1.34 mg/dL (H)).   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  Pharmacy also consulted to bridge with IV heparin.    Heparin level trended up to 0.62 units/mL.  She is on her menstrual cycle; no bleeding reported.  Goal of Therapy:  Heparin level 0.3-0.7 units/ml, aim for low goal with anemia Monitor platelets by anticoagulation protocol: Yes   Plan:  Reduce heparin infusion slightly to 1400 units/hr Daily heparin level and CBC F/U [facility] plans post surgery  Marcile Fuquay D. Mina Marble, PharmD, BCPS, [place] 04/12/2021, 6:59 AM

## 2021-04-13 ENCOUNTER — Inpatient Hospital Stay: Payer: Self-pay

## 2021-04-13 DIAGNOSIS — L089 Local infection of the skin and subcutaneous tissue, unspecified: Secondary | ICD-10-CM | POA: Diagnosis not present

## 2021-04-13 LAB — CBC
HCT: 22.1 % — ABNORMAL LOW (ref 36.0–46.0)
Hemoglobin: 6.8 g/dL — CL (ref 12.0–15.0)
MCH: 27 pg (ref 26.0–34.0)
MCHC: 30.8 g/dL (ref 30.0–36.0)
MCV: 87.7 fL (ref 80.0–100.0)
Platelets: 320 10*3/uL (ref 150–400)
RBC: 2.52 MIL/uL — ABNORMAL LOW (ref 3.87–5.11)
RDW: 15.9 % — ABNORMAL HIGH (ref 11.5–15.5)
WBC: 24.7 10*3/uL — ABNORMAL HIGH (ref 4.0–10.5)
nRBC: 0.1 % (ref 0.0–0.2)

## 2021-04-13 LAB — HEMOGLOBIN AND HEMATOCRIT, BLOOD
HCT: 21.4 % — ABNORMAL LOW (ref 36.0–46.0)
Hemoglobin: 6.6 g/dL — CL (ref 12.0–15.0)

## 2021-04-13 LAB — BASIC METABOLIC PANEL WITH GFR
Anion gap: 3 — ABNORMAL LOW (ref 5–15)
BUN: 19 mg/dL (ref 6–20)
CO2: 25 mmol/L (ref 22–32)
Calcium: 7.6 mg/dL — ABNORMAL LOW (ref 8.9–10.3)
Chloride: 107 mmol/L (ref 98–111)
Creatinine, Ser: 1.5 mg/dL — ABNORMAL HIGH (ref 0.44–1.00)
GFR, Estimated: 44 mL/min — ABNORMAL LOW
Glucose, Bld: 196 mg/dL — ABNORMAL HIGH (ref 70–99)
Potassium: 4.2 mmol/L (ref 3.5–5.1)
Sodium: 135 mmol/L (ref 135–145)

## 2021-04-13 LAB — GLUCOSE, CAPILLARY
Glucose-Capillary: 176 mg/dL — ABNORMAL HIGH (ref 70–99)
Glucose-Capillary: 175 mg/dL — ABNORMAL HIGH (ref 70–99)
Glucose-Capillary: 137 mg/dL — ABNORMAL HIGH (ref 70–99)
Glucose-Capillary: 117 mg/dL — ABNORMAL HIGH (ref 70–99)

## 2021-04-13 MED ORDER — INSULIN ASPART 100 UNIT/ML IJ SOLN
0.0000 [IU] | Freq: Three times a day (TID) | INTRAMUSCULAR | Status: DC
  Administered 2021-04-13: 2 [IU] via SUBCUTANEOUS
  Administered 2021-04-13: 1 [IU] via SUBCUTANEOUS

## 2021-04-13 MED ORDER — DAPAGLIFLOZIN PROPANEDIOL 10 MG PO TABS: 10.0000 mg | ORAL_TABLET | Freq: Every day | ORAL | Status: DC

## 2021-04-13 MED ORDER — CHLORHEXIDINE GLUCONATE CLOTH 2 % EX PADS
6.0000 | MEDICATED_PAD | Freq: Every day | CUTANEOUS | Status: DC
  Administered 2021-04-13 – 2021-04-17 (×5): 6 via TOPICAL

## 2021-04-13 MED ORDER — RIVAROXABAN 20 MG PO TABS
20.0000 mg | ORAL_TABLET | Freq: Every day | ORAL | Status: DC
  Administered 2021-04-13 – 2021-04-17 (×5): 20 mg via ORAL
  Filled 2021-04-13 (×5): qty 1

## 2021-04-13 MED ORDER — SODIUM CHLORIDE 0.9% IV SOLUTION: Freq: Once | INTRAVENOUS | Status: DC

## 2021-04-13 MED ORDER — INSULIN ASPART 100 UNIT/ML IJ SOLN: 0.0000 [IU] | Freq: Every day | INTRAMUSCULAR | Status: DC

## 2021-04-13 MED ORDER — INSULIN ASPART 100 UNIT/ML IJ SOLN
3.0000 [IU] | Freq: Three times a day (TID) | INTRAMUSCULAR | Status: DC
  Administered 2021-04-13 – 2021-04-17 (×11): 3 [IU] via SUBCUTANEOUS

## 2021-04-13 MED ORDER — SODIUM CHLORIDE 0.9% FLUSH
10.0000 mL | Freq: Two times a day (BID) | INTRAVENOUS | Status: DC
  Administered 2021-04-13 – 2021-04-17 (×6): 10 mL

## 2021-04-13 MED ORDER — SODIUM CHLORIDE 0.9% FLUSH: 10.0000 mL | INTRAVENOUS | Status: DC | PRN

## 2021-04-13 NOTE — Progress Notes (Signed)
Notified on call NP of hgb 6.6 with  new orders .

## 2021-04-13 NOTE — Progress Notes (Signed)
PATIENT ID: Sherri Dixon  MRN: [id number]  DOB/AGE:  Jun 24, 1981 / 40 y.o.  1 Day Post-Op Procedure(s) (LRB): TRANSMETATARSAL AMPUTATION (Right) IRRIGATION AND DEBRIDEMENT RIGHT FOOT (Right) APPLICATION OF WOUND VAC (Right)  Subjective: Pain is mild.  No c/o chest pain or SOB.  Has some nausea. Reports she did not sleep well. She is eager to know how her surgery went.   Objective: Vital signs in last 24 hours: Temp:  [97.4 F (36.3 C)-98.6 F (37 C)] 98.2 F (36.8 C) (07/16 0348) Pulse Rate:  [58-76] 68 (07/16 0348) Resp:  [10-18] 16 (07/16 0348) BP: (98-164)/(50-82) 129/71 (07/16 0348) SpO2:  [98 %-100 %] 100 % (07/16 0348)  Intake/Output from previous day: 07/15 0701 - 07/16 0700 In: 66 [P.O.:120; I.V.:600; IV Piggyback:100] Out: 470 [Emesis/NG output:400; Drains:50; Blood:20] Intake/Output this shift: No intake/output data recorded.  Recent Labs    04/10/21 1541 04/11/21 0022 04/12/21 0217  HGB 11.2* 7.4* 7.4*   Recent Labs    04/11/21 0022 04/12/21 0217  WBC 22.8* 18.9*  RBC 2.74* 2.69*  HCT 23.5* 22.7*  PLT 289 274   Recent Labs    04/12/21 0217 04/13/21 0544  NA 136 135  K 3.8 4.2  CL 106 107  CO2 25 25  BUN 17 19  CREATININE 1.34* 1.50*  GLUCOSE 140* 196*  CALCIUM 7.6* 7.6*     Physical Exam: Neurologically intact Incision: dressing C/D/I Compartment soft Wound vac in place and working well  Assessment/Plan: 1 Day Post-Op Procedure(s) (LRB): TRANSMETATARSAL AMPUTATION (Right) IRRIGATION AND DEBRIDEMENT RIGHT FOOT (Right) APPLICATION OF WOUND VAC (Right)   Patient will require PICC line for 6 weeks of IV abx.  Nonweightbearing to operative foot Keep VAC in place Will change VAC in 3 to 5 days.  Linas Stepter L. [place], PA-C 04/13/2021, 8:13 AM

## 2021-04-13 NOTE — Progress Notes (Signed)
Peripherally Inserted Central Catheter Placement  The IV Nurse has discussed with the patient and/or persons authorized to consent for the patient, the purpose of this procedure and the potential benefits and risks involved with this procedure.  The benefits include less needle sticks, lab draws from the catheter, and the patient may be discharged home with the catheter. Risks include, but not limited to, infection, bleeding, blood clot (thrombus formation), and puncture of an artery; nerve damage and irregular heartbeat and possibility to perform a PICC exchange if needed/ordered by physician.  Alternatives to this procedure were also discussed.  Bard Power PICC patient education guide, fact sheet on infection prevention and patient information card has been provided to patient /or left at bedside.    PICC Placement Documentation  PICC Single Lumen 04/13/21 Right Brachial 40 cm 0 cm (Active)  Indication for Insertion or Continuance of Line Home intravenous therapies (PICC only) 04/13/21 1200  Exposed Catheter (cm) 0 cm 04/13/21 1200  Site Assessment Clean;Dry;Intact 04/13/21 1200  Line Status Flushed;Saline locked;Blood return noted 04/13/21 1200  Dressing Type Transparent;Securing device 04/13/21 1200  Dressing Status Clean;Dry;Intact 04/13/21 1200  Antimicrobial disc in place? Yes 04/13/21 1200  Line Care Connections checked and tightened 04/13/21 1200  Line Adjustment (NICU/IV Team Only) No 04/13/21 1200  Dressing Intervention New dressing 04/13/21 1200  Dressing Change Due 04/20/21 04/13/21 1200       Rolena Infante 04/13/2021, 1:00 PM

## 2021-04-13 NOTE — Progress Notes (Signed)
Notified Sherryll Burger NP of critical hgb 6.8. No new orders at this time.

## 2021-04-13 NOTE — Plan of Care (Signed)
  Problem: Education: Goal: Knowledge of General Education information will improve Description: Including pain rating scale, medication(s)/side effects and non-pharmacologic comfort measures Outcome: Progressing   Problem: Health Behavior/Discharge Planning: Goal: Ability to manage health-related needs will improve Outcome: Progressing   Problem: Clinical Measurements: Goal: Ability to maintain clinical measurements within normal limits will improve Outcome: Progressing Goal: Respiratory complications will improve Outcome: Progressing Goal: Cardiovascular complication will be avoided Outcome: Progressing   Problem: Activity: Goal: Risk for activity intolerance will decrease Outcome: Progressing   Problem: Nutrition: Goal: Adequate nutrition will be maintained Outcome: Progressing   Problem: Pain Managment: Goal: General experience of comfort will improve Outcome: Progressing   Problem: Safety: Goal: Ability to remain free from injury will improve Outcome: Progressing   Problem: Skin Integrity: Goal: Risk for impaired skin integrity will decrease Outcome: Progressing   

## 2021-04-13 NOTE — Progress Notes (Signed)
[facility] PROGRESS NOTE    Progress Note  Sherri Dixon  QVZ:[id number] DOB: Jul 24, 1981 DOA: 04/05/2021 PCP: Tomasa Hose, NP     Brief Narrative:   Sherri Dixon is an 40 y.o. female past medical history of asthma, chronic diastolic heart failure, essential hypertension diabetes mellitus insulin-dependent, microcytic anemia history of PE on Xarelto comes in for right foot pain and swelling after stepping on a piece of glass about a week ago.  Was found to have a necrotic infected foot orthopedic surgery was consulted she was taken to the OR with irrigation and debridement and wound VAC placement.  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  With significant necrotizing tissue and op note wound VAC in place. Now on Rocephin and Flagyl. Blood cultures from 04/06/2021 have remained negative till date. Wound culture from OR grew Streptococcus gallolyticus and Proteus  She is status post transmetatarsal imitation 04/12/2021 surgical pathology specimen sent. Insert PICC line will need 6 weeks of antibiotic started on 04/12/2021.  Tentative date 05/24/2021. Will go home on antibiotics and with physical therapy and will get TOC involved.  Acute kidney injury: In the setting of sepsis and ARB use.  With a baseline creatinine of 1.1-1.3 Creatinine trending down with fluid hydration.  Urinary retention: Appears to be resolved.  Hypokalemia: Replete orally now resolved.  Microcytic anemia: In a menstruating female with heavy periods on Xarelto.  She is now on IV heparin. She status post 2 units of packed red blood cells her hemoglobin has been stable.  History of PE: Xarelto has been held for surgical procedure, currently on IV heparin.  Chronic diastolic heart failure: Appears  euvolemic resume Lasix and other heart failure medications.  Hyperlipidemia:  continue Crestor.  Diabetes mellitus type 2: With an A1c of 8.9, continue long-acting insulin per sliding scale. We will go ahead and start her back on her procedure.  History of PE/history of CVA: Currently on a heparin drip, Xarelto has been held . Will need to hold IV heparin 4 hours prior to procedure.  Essential hypertension: ARB Held due to acute kidney injury. Continue metoprolol and hydralazine.   DVT prophylaxis: heparin Family Communication:husband Status is: Inpatient  Remains inpatient appropriate because:Hemodynamically unstable  Dispo: The patient is from: Home              Anticipated d/c is to: Home              Patient currently is not medically stable to d/c.   Difficult to place patient No        Code Status:     Code Status Orders  (From admission, onward)           Start     Ordered   04/05/21 2049  Full code  Continuous        04/05/21 2051           Code Status History     Date Active Date Inactive Code Status Order ID Comments User Context   01/25/2021 1948 01/28/2021 1939 Full Code [id number]  Rise Patience, MD ED   12/12/2020 2345 12/15/2020 1717 Full Code [id number]  Rise Patience, MD ED   07/27/2019 1646 07/31/2019 2132 Full Code [id number]  Cleon Gustin, MD Inpatient   03/10/2019 1030 03/11/2019 1559 Full Code [id number]  Domenic Polite, MD  ED   05/22/2018 1455 05/24/2018 1917 Full Code [id number]  Debbe Odea, MD ED   06/05/2016 1613 06/06/2016 1540 Full Code [id number]  Caren Griffins, MD ED         IV Access:   Peripheral IV   Procedures and diagnostic studies:   No results found.   Medical Consultants:   None.   Subjective:    Sherri Dixon pain is controlled.  Objective:    Vitals:   04/12/21 2041 04/13/21 0019 04/13/21 0348 04/13/21 0822  BP: (!) 101/50 (!) 118/56 129/71 (!) 108/40  Pulse: 70 72 68 69   Resp: 17 16 16 16   Temp: (!) 97.4 F (36.3 C) 97.9 F (36.6 C) 98.2 F (36.8 C) 98.5 F (36.9 C)  TempSrc: Oral Oral Oral Oral  SpO2: 100% 100% 100% 99%  Weight:      Height:       SpO2: 99 % O2 Flow Rate (L/min): 5 L/min   Intake/Output Summary (Last 24 hours) at 04/13/2021 0936 Last data filed at 04/13/2021 0659 Gross per 24 hour  Intake 820 ml  Output 470 ml  Net 350 ml    Filed Weights   04/06/21 1108 04/08/21 0500 04/09/21 0451  Weight: (!) 154.2 kg (!) 139.2 kg (!) 139 kg    Exam: General exam: In no acute distress. Respiratory system: Good air movement and clear to auscultation. Cardiovascular system: S1 & S2 heard, RRR. No JVD. Gastrointestinal system: Abdomen is nondistended, soft and nontender.  Extremities: No pedal edema. Skin: No rashes, lesions or ulcers  Data Reviewed:    Labs: Basic Metabolic Panel: Recent Labs  Lab 04/09/21 0056 04/10/21 0243 04/11/21 0022 04/12/21 0217 04/13/21 0544  NA 134* 136 134* 136 135  K 3.7 3.7 3.8 3.8 4.2  CL 106 107 105 106 107  CO2 23 22 22 25 25   GLUCOSE 118* 146* 92 140* 196*  BUN 24* 21* 19 17 19   CREATININE 1.81* 1.64* 1.43* 1.34* 1.50*  CALCIUM 7.5* 7.3* 7.1* 7.6* 7.6*    GFR Estimated Creatinine Clearance: 66.1 mL/min (A) (by C-G formula based on SCr of 1.5 mg/dL (H)). Liver Function Tests: No results for input(s): AST, ALT, ALKPHOS, BILITOT, PROT, ALBUMIN in the last 168 hours.  No results for input(s): LIPASE, AMYLASE in the last 168 hours. No results for input(s): AMMONIA in the last 168 hours. Coagulation profile No results for input(s): INR, PROTIME in the last 168 hours.  COVID-19 Labs  No results for input(s): DDIMER, FERRITIN, LDH, CRP in the last 72 hours.   Lab Results  Component Value Date   [place] NEGATIVE 04/05/2021   [place] NEGATIVE 01/25/2021   [place] NEGATIVE 12/12/2020   SARSCOV2NAA NOT DETECTED 07/25/2019    CBC: Recent Labs  Lab 04/08/21 1246  04/09/21 0056 04/10/21 0243 04/10/21 1541 04/11/21 0022 04/12/21 0217  WBC 15.6* 14.1* 18.4*  --  22.8* 18.9*  HGB 7.8* 7.3* 6.3* 11.2* 7.4* 7.4*  HCT 24.7* 23.4* 20.1* 34.6* 23.5* 22.7*  MCV 84.0 84.5 85.5  --  85.8 84.4  PLT 426* 359 284  --  289 274    Cardiac Enzymes: Recent Labs  Lab 04/07/21 1005 04/08/21 1246  CKTOTAL 30* 22*    BNP (last 3 results) No results for input(s): PROBNP in the last 8760 hours. CBG: Recent Labs  Lab 04/12/21 1157 04/12/21 1425 04/12/21 1630 04/12/21 2042 04/13/21 0744  GLUCAP 96 93 84 176* 176*    D-Dimer: No results for input(s): DDIMER in  the last 72 hours. Hgb A1c: No results for input(s): HGBA1C in the last 72 hours.  Lipid Profile: No results for input(s): CHOL, HDL, LDLCALC, TRIG, CHOLHDL, LDLDIRECT in the last 72 hours. Thyroid function studies: No results for input(s): TSH, T4TOTAL, T3FREE, THYROIDAB in the last 72 hours.  Invalid input(s): FREET3 Anemia work up: No results for input(s): VITAMINB12, FOLATE, FERRITIN, TIBC, IRON, RETICCTPCT in the last 72 hours. Sepsis Labs: Recent Labs  Lab 04/09/21 0056 04/10/21 0243 04/11/21 0022 04/12/21 0217  WBC 14.1* 18.4* 22.8* 18.9*    Microbiology Recent Results (from the past 240 hour(s))  Resp Panel by RT-PCR (Flu A&B, Covid) Nasopharyngeal Swab     Status: None   Collection Time: 04/05/21  4:40 PM   Specimen: Nasopharyngeal Swab; Nasopharyngeal(NP) swabs in vial transport medium  Result Value Ref Range Status   SARS Coronavirus 2 by RT PCR NEGATIVE NEGATIVE Final    Comment: (NOTE) SARS-CoV-2 target nucleic acids are NOT DETECTED.  The SARS-CoV-2 RNA is generally detectable in upper respiratory specimens during the acute phase of infection. The lowest concentration of SARS-CoV-2 viral copies this assay can detect is 138 copies/mL. A negative result does not preclude SARS-Cov-2 infection and should not be used as the sole basis for treatment or other patient  management decisions. A negative result may occur with  improper specimen collection/handling, submission of specimen other than nasopharyngeal swab, presence of viral mutation(s) within the areas targeted by this assay, and inadequate number of viral copies(<138 copies/mL). A negative result must be combined with clinical observations, patient history, and epidemiological information. The expected result is Negative.  Fact Sheet for Patients:  EntrepreneurPulse.com.au  Fact Sheet for Healthcare Providers:  IncredibleEmployment.be  This test is no t yet approved or cleared by the [place] FDA and  has been authorized for detection and/or diagnosis of SARS-CoV-2 by FDA under an Emergency Use Authorization (EUA). This EUA will remain  in effect (meaning this test can be used) for the duration of the COVID-19 declaration under Section 564(b)(1) of the Act, 21 U.S.C.section 360bbb-3(b)(1), unless the authorization is terminated  or revoked sooner.       Influenza A by PCR NEGATIVE NEGATIVE Final   Influenza B by PCR NEGATIVE NEGATIVE Final    Comment: (NOTE) The Xpert Xpress SARS-CoV-2/FLU/RSV plus assay is intended as an aid in the diagnosis of influenza from Nasopharyngeal swab specimens and should not be used as a sole basis for treatment. Nasal washings and aspirates are unacceptable for Xpert Xpress SARS-CoV-2/FLU/RSV testing.  Fact Sheet for Patients: EntrepreneurPulse.com.au  Fact Sheet for Healthcare Providers: IncredibleEmployment.be  This test is not yet approved or cleared by the [place] FDA and has been authorized for detection and/or diagnosis of SARS-CoV-2 by FDA under an Emergency Use Authorization (EUA). This EUA will remain in effect (meaning this test can be used) for the duration of the COVID-19 declaration under Section 564(b)(1) of the Act, 21 U.S.C. section 360bbb-3(b)(1),  unless the authorization is terminated or revoked.  Performed at [facility], [place] [address] [place], [place] [address]   Blood Culture (routine x 2)     Status: None   Collection Time: 04/05/21  5:23 PM   Specimen: BLOOD  Result Value Ref Range Status   Specimen Description   Final    BLOOD BLOOD RIGHT ARM Performed at [facility] [address], [place],  [address]    Special Requests   Final    BOTTLES DRAWN  AEROBIC ONLY Blood Culture adequate volume Performed at [facility] [address], [place], [place] [address]    Culture   Final    NO GROWTH 5 DAYS Performed at [facility], [place] [address], [place], [place] [address]    Report Status 04/10/2021 FINAL  Final  Blood Culture (routine x 2)     Status: None   Collection Time: 04/05/21  7:30 PM   Specimen: BLOOD  Result Value Ref Range Status   Specimen Description   Final    BLOOD RIGHT ANTECUBITAL Performed at [facility] [address] [place], [place] [address]    Special Requests   Final    BOTTLES DRAWN AEROBIC AND ANAEROBIC Blood Culture adequate volume Performed at [facility] [address], [place], [place] [address]    Culture   Final    NO GROWTH 5 DAYS Performed at [facility], [place] [address], [place], [place] [address]    Report Status 04/10/2021 FINAL  Final  Surgical pcr screen     Status: Abnormal   Collection Time: 04/06/21  8:55 AM   Specimen: Nasal Mucosa; Nasal Swab  Result Value Ref Range Status   MRSA, PCR NEGATIVE NEGATIVE Final   Staphylococcus aureus POSITIVE (A) NEGATIVE Final    Comment: (NOTE) The Xpert SA Assay (FDA approved for NASAL specimens in patients 56 years of age and older), is one component of a comprehensive surveillance program. It is not intended to diagnose infection nor to guide or monitor treatment. Performed at [facility], [place] [address], [place], [place] [address]   Aerobic/Anaerobic Culture w Gram Stain (surgical/deep wound)     Status: None   Collection Time: 04/06/21 11:44 AM   Specimen: Abscess  Result Value Ref Range Status   Specimen Description ABSCESS  Final   Special Requests RIGHT FOOT ABSCESS SPEC A  Final   Gram Stain   Final    NO WBC SEEN MODERATE GRAM POSITIVE COCCI IN PAIRS AND CHAINS IN TETRADS MODERATE GRAM NEGATIVE RODS FEW GRAM POSITIVE RODS    Culture   Final    ABUNDANT PROTEUS MIRABILIS MODERATE STREPTOCOCCUS GALLOLYTICUS NO ANAEROBES ISOLATED Performed at [facility], [place] [address], [place], [place] [address]    Report Status 04/11/2021 FINAL  Final   Organism ID, Bacteria PROTEUS MIRABILIS  Final   Organism ID, Bacteria STREPTOCOCCUS GALLOLYTICUS  Final      Susceptibility   Proteus mirabilis - MIC*    AMPICILLIN <=2 SENSITIVE Sensitive     CEFAZOLIN 8 SENSITIVE Sensitive     CEFEPIME <=0.12 SENSITIVE Sensitive     CEFTAZIDIME <=1 SENSITIVE Sensitive     CEFTRIAXONE <=0.25 SENSITIVE Sensitive     CIPROFLOXACIN <=0.25 SENSITIVE Sensitive     GENTAMICIN <=1 SENSITIVE Sensitive     IMIPENEM 4 SENSITIVE Sensitive     TRIMETH/SULFA <=20 SENSITIVE Sensitive     AMPICILLIN/SULBACTAM <=2 SENSITIVE Sensitive     PIP/TAZO <=4 SENSITIVE Sensitive     * ABUNDANT PROTEUS MIRABILIS   Streptococcus gallolyticus - MIC*    PENICILLIN 0.12 SENSITIVE Sensitive     CEFTRIAXONE 0.25 SENSITIVE Sensitive     ERYTHROMYCIN >=8 RESISTANT Resistant     LEVOFLOXACIN 4 INTERMEDIATE Intermediate     VANCOMYCIN 0.25 SENSITIVE Sensitive     * MODERATE STREPTOCOCCUS GALLOLYTICUS     Medications:    sodium chloride   Intravenous Once   docusate  sodium  100 mg Oral Daily   furosemide  80 mg Oral BID   hydrALAZINE  50 mg Oral TID   insulin aspart  0-9 Units Subcutaneous TID WC   metoprolol succinate  100 mg Oral Daily   metroNIDAZOLE  500 mg Oral Q8H   pantoprazole  40 mg Oral q1800    rosuvastatin  40 mg Oral Daily   Continuous Infusions:  cefTRIAXone (ROCEPHIN)  IV 2 g (04/12/21 2224)   heparin 1,400 Units/hr (04/13/21 0559)   magnesium sulfate bolus IVPB        LOS: 8 days   Charlynne Cousins  [facility]  04/13/2021, 9:36 AM

## 2021-04-13 NOTE — Anesthesia Postprocedure Evaluation (Signed)
Anesthesia Post Note  Patient: Sherri Dixon  Procedure(s) Performed: TRANSMETATARSAL AMPUTATION (Right: Toe) IRRIGATION AND DEBRIDEMENT RIGHT FOOT (Right: Foot) APPLICATION OF WOUND VAC (Right: Foot)     Patient location during evaluation: PACU Anesthesia Type: General Level of consciousness: awake and alert Pain management: pain level controlled Vital Signs Assessment: post-procedure vital signs reviewed and stable Respiratory status: spontaneous breathing, nonlabored ventilation, respiratory function stable and patient connected to nasal cannula oxygen Cardiovascular status: blood pressure returned to baseline and stable Postop Assessment: no apparent nausea or vomiting Anesthetic complications: no   No notable events documented.  Last Vitals:  Vitals:   04/13/21 0348 04/13/21 0822  BP: 129/71 (!) 108/40  Pulse: 68 69  Resp: 16 16  Temp: 36.8 C 36.9 C  SpO2: 100% 99%    Last Pain:  Vitals:   04/13/21 0822  TempSrc: Oral  PainSc:                  Shakayla Hickox L Cecile Guevara

## 2021-04-13 NOTE — Evaluation (Signed)
Occupational Therapy Re-Evaluation Patient Details Name: Sherri Dixon MRN: [id number] DOB: [birth year] Today's Date: 04/13/2021    History of Present Illness 40 y.o. female with medical history significant of asthma, chronic diastolic CHF, hypertension, hyperlipidemia, insulin-dependent diabetes melitis, chronic microcytic anemia, GERD, history of PE on Xarelto, morbid obesity (BMI 62.19), admitted for stroke in May 2022 presented to the ED with complaints of right foot pain, swelling, and drainage after she stepped on a piece of glass. 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   Clinical Impression   Pt re-assessed following R transmet amp 7/15. Pt currently requires min A with bed mobility to sit EOB, max A with LEs back onto bed, mod A with lateral scooting towards rail/HOB, total A - max A with LB ADLs, min guard A with UB ADLs, unable to stand at this time. Pt fatigues easily with c/o slight dizziness in sitting. Pt would continue to benefit from acute OT services to maximize level of function and safety    Follow Up Recommendations  CIR    Equipment Recommendations  Other (comment);3 in 1 bedside commode;Wheelchair (measurements OT);Wheelchair cushion (measurements OT)    Recommendations for Other Services Rehab consult     Precautions / Restrictions Precautions Precautions: Fall;Other (comment) Precaution Comments: Wound VAC Restrictions Weight Bearing Restrictions: Yes RLE Weight Bearing: Non weight bearing      Mobility Bed Mobility Overal bed mobility: Needs Assistance Bed Mobility: Supine to Sit;Sit to Supine     Supine to sit: Min assist Sit to supine: Mod assist   General bed mobility comments: assist with trunk for sup to sit, assist with BLE and scooting hips back to bed, +rail, cues for sequencing, increased time    Transfers Overall transfer level: Needs assistance   Transfers: Lateral/Scoot Transfers          Lateral/Scoot Transfers: Mod  assist General transfer comment: mod assist scooting along EOB for better positioning prior to return to supine    Balance Overall balance assessment: Needs assistance Sitting-balance support: Feet supported;No upper extremity supported Sitting balance-Leahy Scale: Good                                     ADL either performed or assessed with clinical judgement   ADL Overall ADL's : Needs assistance/impaired Eating/Feeding: Set up;Independent;Sitting   Grooming: Wash/dry hands;Wash/dry face;Sitting;Min guard   Upper Body Bathing: Min guard;Sitting   Lower Body Bathing: Maximal assistance;Moderate assistance;Sitting/lateral leans   Upper Body Dressing : Min guard;Sitting   Lower Body Dressing: Total assistance       Toileting- Clothing Manipulation and Hygiene: Total assistance;Bed level;Sitting/lateral lean;Maximal assistance Toileting - Clothing Manipulation Details (indicate cue type and reason): clothing mgt max A leaning side to side, hygiene total A bed level             Vision Baseline Vision/History: Wears glasses Patient Visual Report: No change from baseline       Perception     Praxis      Pertinent Vitals/Pain Pain Assessment: 0-10 Pain Score: 5  Pain Location: R foot Pain Descriptors / Indicators: Grimacing;Operative site guarding;Constant Pain Intervention(s): Monitored during session;Premedicated before session;Repositioned     Hand Dominance Right   Extremity/Trunk Assessment Upper Extremity Assessment Upper Extremity Assessment: Generalized weakness LUE Deficits / Details: impaird weakness 3/5 grossly (recent CVA)   Lower Extremity Assessment LLE Deficits / Details: 3- hip and 3/5 knee and ankle  LLE Coordination: decreased gross motor   Cervical / Trunk Assessment Cervical / Trunk Assessment: Normal   Communication Communication Communication: No difficulties   Cognition Arousal/Alertness: Awake/alert Behavior During  Therapy: WFL for tasks assessed/performed Overall Cognitive Status: Within Functional Limits for tasks assessed                                     General Comments       Exercises     Shoulder Instructions      Home Living Family/patient expects to be discharged to:: Inpatient rehab Living Arrangements: Spouse/significant other Available Help at Discharge: Family;Available PRN/intermittently Type of Home: House Home Access: Stairs to enter [employer] of Steps: 3+1 Entrance Stairs-Rails: Right;Left;Can reach both Home Layout: One level     Bathroom Shower/Tub: Teacher, early years/pre: Standard     Home Equipment: None          Prior Functioning/Environment Level of Independence: Needs assistance  Gait / Transfers Assistance Needed: ambulates independently with no AD ADL's / Homemaking Assistance Needed: husband helps her do lower extremity dressing since stroke            OT Problem List: Decreased strength;Impaired balance (sitting and/or standing);Pain;Decreased activity tolerance;Decreased knowledge of use of DME or AE      OT Treatment/Interventions: Self-care/ADL training;Patient/family education;Therapeutic exercise;Balance training;Therapeutic activities    OT Goals(Current goals can be found in the care plan section) Acute Rehab OT Goals Patient Stated Goal: get stronger and go home OT Goal Formulation: With patient Time For Goal Achievement: 04/27/21 Potential to Achieve Goals: Good ADL Goals Pt Will Perform Grooming: with supervision;with set-up Pt Will Perform Upper Body Bathing: with set-up;with supervision Pt Will Perform Lower Body Bathing: with mod assist;with min assist;sitting/lateral leans Pt Will Perform Upper Body Dressing: with supervision;with set-up;sitting Pt Will Transfer to Toilet: with max assist;with mod assist;stand pivot transfer;with transfer board;anterior/posterior transfer  OT Frequency:  Min 2X/week   Barriers to D/C:            Co-evaluation              AM-PAC OT "6 Clicks" Daily Activity     Outcome Measure Help from another person eating meals?: None Help from another person taking care of personal grooming?: A Little Help from another person toileting, which includes using toliet, bedpan, or urinal?: Total Help from another person bathing (including washing, rinsing, drying)?: A Lot Help from another person to put on and taking off regular upper body clothing?: A Little Help from another person to put on and taking off regular lower body clothing?: Total 6 Click Score: 14   End of Session    Activity Tolerance: Patient limited by fatigue Patient left: in bed;with call bell/phone within reach  OT Visit Diagnosis: Other abnormalities of gait and mobility (R26.89);Muscle weakness (generalized) (M62.81);Pain Pain - Right/Left: Right Pain - part of body: Ankle and joints of foot                Time: 1241-1259 OT Time Calculation (min): 18 min Charges:  OT General Charges $OT Visit: 1 Visit OT Evaluation $OT Re-eval: 1 Re-eval    Britt Bottom 04/13/2021, 1:45 PM

## 2021-04-13 NOTE — Progress Notes (Signed)
Physical Therapy Treatment Patient Details Name: Sherri Dixon MRN: [id number] DOB: 10-Apr-1981 Today's Date: 04/13/2021    History of Present Illness 40 y.o. female with medical history significant of asthma, chronic diastolic CHF, hypertension, hyperlipidemia, insulin-dependent diabetes melitis, chronic microcytic anemia, GERD, history of PE on Xarelto, morbid obesity (BMI 62.19), admitted for stroke in May 2022 presented to the ED with complaints of right foot pain, swelling, and drainage after she stepped on a piece of glass. 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    PT Comments    Pt re-assessed following R transmet amp 7/15. She required min assist supine to sit, mod assist scooting along EOB, and mod assist sit to supine. She demonstrates good sitting balance. Hgb dropped from 11.2 to 7.4 following surgery. Pt with reports of mild dizziness with sitting and nausea in supine and sitting. Pt with episode of vomiting earlier this AM. Pt returned to supine in bed at end of session. Current POC remains appropriate.    Follow Up Recommendations  CIR     Equipment Recommendations  None recommended by PT    Recommendations for Other Services       Precautions / Restrictions Precautions Precautions: Fall;Other (comment) Precaution Comments: Wound VAC Restrictions RLE Weight Bearing: Non weight bearing    Mobility  Bed Mobility Overal bed mobility: Needs Assistance Bed Mobility: Supine to Sit;Sit to Supine     Supine to sit: Min assist Sit to supine: Mod assist   General bed mobility comments: assist with trunk for sup to sit, assist with BLE and scooting hips back to bed, +rail, cues for sequencing, increased time    Transfers     Transfers: Lateral/Scoot Transfers          Lateral/Scoot Transfers: Mod assist General transfer comment: mod assist scooting along EOB for better positioning prior to return to supine  Ambulation/Gait             General Gait  Details: unable   Stairs             Wheelchair Mobility    Modified Rankin (Stroke Patients Only)       Balance Overall balance assessment: Needs assistance Sitting-balance support: Feet supported;No upper extremity supported Sitting balance-Leahy Scale: Good                                      Cognition Arousal/Alertness: Awake/alert Behavior During Therapy: WFL for tasks assessed/performed Overall Cognitive Status: Within Functional Limits for tasks assessed                                        Exercises      General Comments        Pertinent Vitals/Pain Pain Assessment: 0-10 Pain Score: 10-Worst pain ever Pain Location: R foot Pain Descriptors / Indicators: Grimacing;Operative site guarding;Constant Pain Intervention(s): Limited activity within patient's tolerance;Repositioned;Monitored during session;Premedicated before session    Home Living                      Prior Function            PT Goals (current goals can now be found in the care plan section) Acute Rehab PT Goals Patient Stated Goal: get stronger and go home Progress towards PT goals: Progressing toward goals  Frequency    Min 5X/week      PT Plan Current plan remains appropriate    Co-evaluation              AM-PAC PT "6 Clicks" Mobility   Outcome Measure  Help needed turning from your back to your side while in a flat bed without using bedrails?: A Little Help needed moving from lying on your back to sitting on the side of a flat bed without using bedrails?: A Little Help needed moving to and from a bed to a chair (including a wheelchair)?: A Lot Help needed standing up from a chair using your arms (e.g., wheelchair or bedside chair)?: Total Help needed to walk in hospital room?: Total Help needed climbing 3-5 steps with a railing? : Total 6 Click Score: 11    End of Session   Activity Tolerance: Patient limited by  pain;Other (comment) (nausea) Patient left: in bed;with call bell/phone within reach Nurse Communication: Mobility status;Need for lift equipment PT Visit Diagnosis: Unsteadiness on feet (R26.81);Pain Pain - Right/Left: Right Pain - part of body: Ankle and joints of foot     Time: 8335-8251 PT Time Calculation (min) (ACUTE ONLY): 25 min  Charges:  $Therapeutic Activity: 23-37 mins                     Sherri Dixon, PT  Office # [phone] Pager [phone]    Sherri Dixon 04/13/2021, 8:43 AM

## 2021-04-14 ENCOUNTER — Encounter (HOSPITAL_COMMUNITY): Payer: Self-pay | Admitting: Orthopaedic Surgery

## 2021-04-14 DIAGNOSIS — L089 Local infection of the skin and subcutaneous tissue, unspecified: Secondary | ICD-10-CM | POA: Diagnosis not present

## 2021-04-14 LAB — GLUCOSE, CAPILLARY
Glucose-Capillary: 103 mg/dL — ABNORMAL HIGH (ref 70–99)
Glucose-Capillary: 120 mg/dL — ABNORMAL HIGH (ref 70–99)
Glucose-Capillary: 101 mg/dL — ABNORMAL HIGH (ref 70–99)
Glucose-Capillary: 88 mg/dL (ref 70–99)

## 2021-04-14 LAB — PREPARE RBC (CROSSMATCH)

## 2021-04-14 LAB — BASIC METABOLIC PANEL
Anion gap: 6 (ref 5–15)
BUN: 20 mg/dL (ref 6–20)
CO2: 26 mmol/L (ref 22–32)
Calcium: 8 mg/dL — ABNORMAL LOW (ref 8.9–10.3)
Chloride: 104 mmol/L (ref 98–111)
Creatinine, Ser: 1.43 mg/dL — ABNORMAL HIGH (ref 0.44–1.00)
GFR, Estimated: 47 mL/min — ABNORMAL LOW (ref >60)
Glucose, Bld: 110 mg/dL — ABNORMAL HIGH (ref 70–99)
Potassium: 3.7 mmol/L (ref 3.5–5.1)
Sodium: 136 mmol/L (ref 135–145)

## 2021-04-14 LAB — HEMOGLOBIN AND HEMATOCRIT, BLOOD
HCT: 27.6 % — ABNORMAL LOW (ref 36.0–46.0)
Hemoglobin: 9 g/dL — ABNORMAL LOW (ref 12.0–15.0)

## 2021-04-14 MED ORDER — SODIUM CHLORIDE 0.9% IV SOLUTION: Freq: Once | INTRAVENOUS | Status: AC

## 2021-04-14 NOTE — Plan of Care (Signed)
  Problem: Education: Goal: Knowledge of General Education information will improve Description: Including pain rating scale, medication(s)/side effects and non-pharmacologic comfort measures Outcome: Progressing   Problem: Health Behavior/Discharge Planning: Goal: Ability to manage health-related needs will improve Outcome: Progressing   Problem: Clinical Measurements: Goal: Ability to maintain clinical measurements within normal limits will improve Outcome: Progressing   Problem: Elimination: Goal: Will not experience complications related to urinary retention Outcome: Progressing   Problem: Pain Managment: Goal: General experience of comfort will improve Outcome: Progressing   Problem: Safety: Goal: Ability to remain free from injury will improve Outcome: Progressing   Problem: Skin Integrity: Goal: Risk for impaired skin integrity will decrease Outcome: Progressing   

## 2021-04-14 NOTE — Progress Notes (Signed)
[facility] PROGRESS NOTE    Progress Note  Sherri Dixon  JOA:[id number] DOB: 10-Dec-1980 DOA: 04/05/2021 PCP: Tomasa Hose, NP     Brief Narrative:   Sherri Dixon is an 40 y.o. female past medical history of asthma, chronic diastolic heart failure, essential hypertension diabetes mellitus insulin-dependent, microcytic anemia history of PE on Xarelto comes in for right foot pain and swelling after stepping on a piece of glass about a week ago.  Was found to have a necrotic infected foot orthopedic surgery was consulted she was taken to the OR with irrigation and debridement and wound VAC placement.  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  With significant necrotizing tissue and op note wound VAC in place. Blood cultures from 04/06/2021 have remained negative till date. Wound culture from OR grew Streptococcus gallolyticus and Proteus  She is status post transmetatarsal imitation 04/12/2021 surgical pathology specimen sent. PICC line inserted on 04/15/2019, she will need 6 weeks of antibiotics, her tentatively end date is 05/24/2021, Now on Rocephin and Flagyl. Consult inpatient rehab to evaluate to see if she qualifies for inpatient rehabilitation.  Acute kidney injury: In the setting of sepsis and ARB use.  With a baseline creatinine of 1.1-1.3 KVO IV fluids patient able to hydrate orally.  Urinary retention: Appears to be resolved.  Hypokalemia: Replete orally now resolved.  Microcytic anemia: In a menstruating female with heavy periods on Xarelto.  She is now on IV heparin. She status post 2 units of packed red blood cells her hemoglobin keeps drifting down, we will go ahead and give her 2 units of packed red blood cells. Check a CBC posttransfusion all. Discontinue IV  heparin will start on oral Xarelto.  Check a CBC posttransfusion.  History of PE: Xarelto has been held for surgical procedure, currently on IV heparin.  Chronic diastolic heart failure: Appears euvolemic resume Lasix and other heart failure medications.  Hyperlipidemia:  continue Crestor.  Diabetes mellitus type 2: With an A1c of 8.9, blood glucose is well controlled in house continue sliding scale.  History of PE/history of CVA: Currently on a heparin drip, Xarelto has been held . Resume Xarelto.  Essential hypertension: ARB Held due to acute kidney injury. Continue metoprolol and hydralazine.   DVT prophylaxis: heparin Family Communication:husband Status is: Inpatient  Remains inpatient appropriate because:Hemodynamically unstable  Dispo: The patient is from: Home              Anticipated d/c is to: Home              Patient currently is not medically stable to d/c.   Difficult to place patient No        Code Status:     Code Status Orders  (From admission, onward)           Start     Ordered   04/05/21 2049  Full code  Continuous        04/05/21 2051           Code Status History     Date Active Date Inactive Code Status Order ID Comments User Context   01/25/2021 1948 01/28/2021 1939 Full Code [id number]  Rise Patience, MD ED   12/12/2020 2345 12/15/2020 1717 Full Code [id number]  Rise Patience, MD ED   07/27/2019 1646 07/31/2019 2132 Full Code [id number]  Cleon Gustin, MD Inpatient   03/10/2019 1030 03/11/2019 1559 Full Code [id number]  Domenic Polite, MD ED   05/22/2018 1455 05/24/2018 1917 Full Code [id number]  Debbe Odea, MD ED   06/05/2016 1613 06/06/2016 1540 Full Code [id number]  Caren Griffins, MD ED         IV Access:   Peripheral IV   Procedures and diagnostic studies:   [place] EKG SITE RITE  Result Date: 04/13/2021 If Site Rite image not attached, placement could not be confirmed due to current cardiac rhythm.     Medical Consultants:   None.   Subjective:    Sherri Dixon pain is controlled no further complaints.  Objective:    Vitals:   04/14/21 0340 04/14/21 0416 04/14/21 0650 04/14/21 0749  BP: 137/88 139/72 (!) 145/82 (!) 151/69  Pulse: 72 72 74 74  Resp: 18 18 18 18   Temp: 98.2 F (36.8 C) 98 F (36.7 C) 98.4 F (36.9 C) 98 F (36.7 C)  TempSrc: Oral Oral Oral Oral  SpO2: 100% 100% 100% 100%  Weight:      Height:       SpO2: 100 % O2 Flow Rate (L/min): 5 L/min   Intake/Output Summary (Last 24 hours) at 04/14/2021 0919 Last data filed at 04/14/2021 0850 Gross per 24 hour  Intake 370 ml  Output 2700 ml  Net -2330 ml    Filed Weights   04/06/21 1108 04/08/21 0500 04/09/21 0451  Weight: (!) 154.2 kg (!) 139.2 kg (!) 139 kg    Exam: General exam: In no acute distress. Respiratory system: Good air movement and clear to auscultation. Cardiovascular system: S1 & S2 heard, RRR. No JVD. Gastrointestinal system: Abdomen is nondistended, soft and nontender.  Extremities: Wound vac in Right foot  Data Reviewed:    Labs: Basic Metabolic Panel: Recent Labs  Lab 04/09/21 0056 04/10/21 0243 04/11/21 0022 04/12/21 0217 04/13/21 0544  NA 134* 136 134* 136 135  K 3.7 3.7 3.8 3.8 4.2  CL 106 107 105 106 107  CO2 23 22 22 25 25   GLUCOSE 118* 146* 92 140* 196*  BUN 24* 21* 19 17 19   CREATININE 1.81* 1.64* 1.43* 1.34* 1.50*  CALCIUM 7.5* 7.3* 7.1* 7.6* 7.6*    GFR Estimated Creatinine Clearance: 66.1 mL/min (A) (by C-G formula based on SCr of 1.5 mg/dL (H)). Liver Function Tests: No results for input(s): AST, ALT, ALKPHOS, BILITOT, PROT, ALBUMIN in the last 168 hours.  No results for input(s): LIPASE, AMYLASE in the last 168 hours. No results for input(s): AMMONIA in the last 168 hours. Coagulation profile No results for input(s): INR, PROTIME in the last 168 hours.  COVID-19 Labs  No results for input(s): DDIMER, FERRITIN, LDH, CRP in the last 72  hours.   Lab Results  Component Value Date   [place] NEGATIVE 04/05/2021   [place] NEGATIVE 01/25/2021   [place] NEGATIVE 12/12/2020   SARSCOV2NAA NOT DETECTED 07/25/2019    CBC: Recent Labs  Lab 04/09/21 0056 04/10/21 0243 04/10/21 1541 04/11/21 0022 04/12/21 0217 04/13/21 0544 04/13/21 2115  WBC 14.1* 18.4*  --  22.8* 18.9* 24.7*  --   HGB 7.3* 6.3* 11.2* 7.4* 7.4* 6.8* 6.6*  HCT 23.4* 20.1* 34.6* 23.5* 22.7* 22.1* 21.4*  MCV 84.5 85.5  --  85.8 84.4 87.7  --   PLT 359 284  --  289 274 320  --     Cardiac Enzymes: Recent Labs  Lab 04/07/21 1005 04/08/21 1246  CKTOTAL 30*  22*    BNP (last 3 results) No results for input(s): PROBNP in the last 8760 hours. CBG: Recent Labs  Lab 04/13/21 0744 04/13/21 1118 04/13/21 1648 04/13/21 2056 04/14/21 0846  GLUCAP 176* 175* 137* 117* 103*    D-Dimer: No results for input(s): DDIMER in the last 72 hours. Hgb A1c: No results for input(s): HGBA1C in the last 72 hours.  Lipid Profile: No results for input(s): CHOL, HDL, LDLCALC, TRIG, CHOLHDL, LDLDIRECT in the last 72 hours. Thyroid function studies: No results for input(s): TSH, T4TOTAL, T3FREE, THYROIDAB in the last 72 hours.  Invalid input(s): FREET3 Anemia work up: No results for input(s): VITAMINB12, FOLATE, FERRITIN, TIBC, IRON, RETICCTPCT in the last 72 hours. Sepsis Labs: Recent Labs  Lab 04/10/21 0243 04/11/21 0022 04/12/21 0217 04/13/21 0544  WBC 18.4* 22.8* 18.9* 24.7*    Microbiology Recent Results (from the past 240 hour(s))  Resp Panel by RT-PCR (Flu A&B, Covid) Nasopharyngeal Swab     Status: None   Collection Time: 04/05/21  4:40 PM   Specimen: Nasopharyngeal Swab; Nasopharyngeal(NP) swabs in vial transport medium  Result Value Ref Range Status   SARS Coronavirus 2 by RT PCR NEGATIVE NEGATIVE Final    Comment: (NOTE) SARS-CoV-2 target nucleic acids are NOT DETECTED.  The SARS-CoV-2 RNA is generally detectable in upper  respiratory specimens during the acute phase of infection. The lowest concentration of SARS-CoV-2 viral copies this assay can detect is 138 copies/mL. A negative result does not preclude SARS-Cov-2 infection and should not be used as the sole basis for treatment or other patient management decisions. A negative result may occur with  improper specimen collection/handling, submission of specimen other than nasopharyngeal swab, presence of viral mutation(s) within the areas targeted by this assay, and inadequate number of viral copies(<138 copies/mL). A negative result must be combined with clinical observations, patient history, and epidemiological information. The expected result is Negative.  Fact Sheet for Patients:  EntrepreneurPulse.com.au  Fact Sheet for Healthcare Providers:  IncredibleEmployment.be  This test is no t yet approved or cleared by the [place] FDA and  has been authorized for detection and/or diagnosis of SARS-CoV-2 by FDA under an Emergency Use Authorization (EUA). This EUA will remain  in effect (meaning this test can be used) for the duration of the COVID-19 declaration under Section 564(b)(1) of the Act, 21 U.S.C.section 360bbb-3(b)(1), unless the authorization is terminated  or revoked sooner.       Influenza A by PCR NEGATIVE NEGATIVE Final   Influenza B by PCR NEGATIVE NEGATIVE Final    Comment: (NOTE) The Xpert Xpress SARS-CoV-2/FLU/RSV plus assay is intended as an aid in the diagnosis of influenza from Nasopharyngeal swab specimens and should not be used as a sole basis for treatment. Nasal washings and aspirates are unacceptable for Xpert Xpress SARS-CoV-2/FLU/RSV testing.  Fact Sheet for Patients: EntrepreneurPulse.com.au  Fact Sheet for Healthcare Providers: IncredibleEmployment.be  This test is not yet approved or cleared by the [place] FDA and has been  authorized for detection and/or diagnosis of SARS-CoV-2 by FDA under an Emergency Use Authorization (EUA). This EUA will remain in effect (meaning this test can be used) for the duration of the COVID-19 declaration under Section 564(b)(1) of the Act, 21 U.S.C. section 360bbb-3(b)(1), unless the authorization is terminated or revoked.  Performed at [facility], [place] [address] [place], [place] [address]   Blood Culture (routine x 2)     Status: None   Collection Time: 04/05/21  5:23 PM  Specimen: BLOOD  Result Value Ref Range Status   Specimen Description   Final    BLOOD BLOOD RIGHT ARM Performed at [place] [address], [place], [place] [address]    Special Requests   Final    BOTTLES DRAWN AEROBIC ONLY Blood Culture adequate volume Performed at [place] [address] [place], [place] [address]    Culture   Final    NO GROWTH 5 DAYS Performed at [facility], [place] [address] [place], [place] [address]    Report Status 04/10/2021 FINAL  Final  Blood Culture (routine x 2)     Status: None   Collection Time: 04/05/21  7:30 PM   Specimen: BLOOD  Result Value Ref Range Status   Specimen Description   Final    BLOOD RIGHT ANTECUBITAL Performed at [place] [address] [place], [place] [address]    Special Requests   Final    BOTTLES DRAWN AEROBIC AND ANAEROBIC Blood Culture adequate volume Performed at [place] [address] [place], [place] [address]    Culture   Final    NO GROWTH 5 DAYS Performed at [facility], [place] [address], [place], [place] [address]    Report Status 04/10/2021 FINAL  Final  Surgical pcr screen     Status: Abnormal   Collection Time: 04/06/21  8:55 AM   Specimen: Nasal Mucosa; Nasal Swab  Result Value Ref Range Status   MRSA, PCR NEGATIVE NEGATIVE Final   Staphylococcus aureus POSITIVE (A) NEGATIVE Final     Comment: (NOTE) The Xpert SA Assay (FDA approved for NASAL specimens in patients 16 years of age and older), is one component of a comprehensive surveillance program. It is not intended to diagnose infection nor to guide or monitor treatment. Performed at [facility], [place] [address] [place], [place] [address]   Aerobic/Anaerobic Culture w Gram Stain (surgical/deep wound)     Status: None   Collection Time: 04/06/21 11:44 AM   Specimen: Abscess  Result Value Ref Range Status   Specimen Description ABSCESS  Final   Special Requests RIGHT FOOT ABSCESS SPEC A  Final   Gram Stain   Final    NO WBC SEEN MODERATE GRAM POSITIVE COCCI IN PAIRS AND CHAINS IN TETRADS MODERATE GRAM NEGATIVE RODS FEW GRAM POSITIVE RODS    Culture   Final    ABUNDANT PROTEUS MIRABILIS MODERATE STREPTOCOCCUS GALLOLYTICUS NO ANAEROBES ISOLATED Performed at [facility], [place] [address] [place], [place] [address]    Report Status 04/11/2021 FINAL  Final   Organism ID, Bacteria PROTEUS MIRABILIS  Final   Organism ID, Bacteria STREPTOCOCCUS GALLOLYTICUS  Final      Susceptibility   Proteus mirabilis - MIC*    AMPICILLIN <=2 SENSITIVE Sensitive     CEFAZOLIN 8 SENSITIVE Sensitive     CEFEPIME <=0.12 SENSITIVE Sensitive     CEFTAZIDIME <=1 SENSITIVE Sensitive     CEFTRIAXONE <=0.25 SENSITIVE Sensitive     CIPROFLOXACIN <=0.25 SENSITIVE Sensitive     GENTAMICIN <=1 SENSITIVE Sensitive     IMIPENEM 4 SENSITIVE Sensitive     TRIMETH/SULFA <=20 SENSITIVE Sensitive     AMPICILLIN/SULBACTAM <=2 SENSITIVE Sensitive     PIP/TAZO <=4 SENSITIVE Sensitive     * ABUNDANT PROTEUS MIRABILIS   Streptococcus gallolyticus - MIC*    PENICILLIN 0.12 SENSITIVE Sensitive     CEFTRIAXONE 0.25 SENSITIVE Sensitive  ERYTHROMYCIN >=8 RESISTANT Resistant     LEVOFLOXACIN 4 INTERMEDIATE Intermediate     VANCOMYCIN 0.25 SENSITIVE Sensitive     * MODERATE STREPTOCOCCUS GALLOLYTICUS     Medications:    sodium  chloride   Intravenous Once   Chlorhexidine Gluconate Cloth  6 each Topical Daily   docusate sodium  100 mg Oral Daily   furosemide  80 mg Oral BID   hydrALAZINE  50 mg Oral TID   insulin aspart  0-5 Units Subcutaneous QHS   insulin aspart  0-9 Units Subcutaneous TID WC   insulin aspart  3 Units Subcutaneous TID WC   metoprolol succinate  100 mg Oral Daily   metroNIDAZOLE  500 mg Oral Q8H   pantoprazole  40 mg Oral q1800   rivaroxaban  20 mg Oral Daily   rosuvastatin  40 mg Oral Daily   sodium chloride flush  10-40 mL Intracatheter Q12H   Continuous Infusions:  cefTRIAXone (ROCEPHIN)  IV 2 g (04/13/21 2200)   magnesium sulfate bolus IVPB        LOS: 9 days   Charlynne Cousins  [facility]  04/14/2021, 9:19 AM

## 2021-04-14 NOTE — Progress Notes (Addendum)
Subjective: 2 Days Post-Op Procedure(s) (LRB): TRANSMETATARSAL AMPUTATION (Right) IRRIGATION AND DEBRIDEMENT RIGHT FOOT (Right) APPLICATION OF WOUND VAC (Right) patient seen at 945 this a.m. Patient reports pain as moderate.  Denies chest pain or shortness of breath.  Objective: Vital signs in last 24 hours: Temp:  [98 F (36.7 C)-99.1 F (37.3 C)] 99.1 F (37.3 C) (07/17 1315) Pulse Rate:  [68-76] 68 (07/17 1315) Resp:  [17-18] 18 (07/17 1315) BP: (116-151)/(60-88) 128/65 (07/17 1315) SpO2:  [98 %-100 %] 100 % (07/17 1315)  Intake/Output from previous day: 07/16 0701 - 07/17 0700 In: 490 [P.O.:120; Blood:370] Out: 2000 [Urine:2000] Intake/Output this shift: Total I/O In: 325 [I.V.:10; Blood:315] Out: 700 [Urine:700]  Recent Labs    04/12/21 0217 04/13/21 0544 04/13/21 2115  HGB 7.4* 6.8* 6.6*   Recent Labs    04/12/21 0217 04/13/21 0544 04/13/21 2115  WBC 18.9* 24.7*  --   RBC 2.69* 2.52*  --   HCT 22.7* 22.1* 21.4*  PLT 274 320  --    Recent Labs    04/13/21 0544 04/14/21 0917  NA 135 136  K 4.2 3.7  CL 107 104  CO2 25 26  BUN 19 20  CREATININE 1.50* 1.43*  GLUCOSE 196* 110*  CALCIUM 7.6* 8.0*   No results for input(s): LABPT, INR in the last 72 hours. Right foot exam: Dressing is clean and dry.  VAC dressing is in place and intact.  Calf soft and nontender.   Assessment/Plan: 2 Days Post-Op Procedure(s) (LRB): TRANSMETATARSAL AMPUTATION (Right) IRRIGATION AND DEBRIDEMENT RIGHT FOOT (Right) APPLICATION OF WOUND VAC (Right) Plan: Up with therapy nonweightbearing on right foot.  Okay to be out of bed daily in chair with right foot elevated. Will need PICC line for IV antibiotics. Continue VAC. Will change VAC early this week.    Erlene Senters 04/14/2021, 2:36 PM

## 2021-04-14 NOTE — Progress Notes (Signed)
BS 88.  Patient given graham crackers/pudding/cranberry juice.

## 2021-04-15 DIAGNOSIS — L089 Local infection of the skin and subcutaneous tissue, unspecified: Secondary | ICD-10-CM | POA: Diagnosis not present

## 2021-04-15 LAB — CBC
HCT: 26.5 % — ABNORMAL LOW (ref 36.0–46.0)
Hemoglobin: 8.6 g/dL — ABNORMAL LOW (ref 12.0–15.0)
MCH: 28.1 pg (ref 26.0–34.0)
MCHC: 32.5 g/dL (ref 30.0–36.0)
MCV: 86.6 fL (ref 80.0–100.0)
Platelets: 393 10*3/uL (ref 150–400)
RBC: 3.06 MIL/uL — ABNORMAL LOW (ref 3.87–5.11)
RDW: 15.5 % (ref 11.5–15.5)
WBC: 20.4 10*3/uL — ABNORMAL HIGH (ref 4.0–10.5)
nRBC: 0 % (ref 0.0–0.2)

## 2021-04-15 LAB — TYPE AND SCREEN
ABO/RH(D): A POS
Antibody Screen: NEGATIVE
Unit division: 0
Unit division: 0

## 2021-04-15 LAB — BPAM RBC
Blood Product Expiration Date: 202207222359
Blood Product Expiration Date: 202208092359
ISSUE DATE / TIME: 202207170348
ISSUE DATE / TIME: 202207171028
Unit Type and Rh: 6200
Unit Type and Rh: 6200

## 2021-04-15 LAB — GLUCOSE, CAPILLARY
Glucose-Capillary: 108 mg/dL — ABNORMAL HIGH (ref 70–99)
Glucose-Capillary: 82 mg/dL (ref 70–99)
Glucose-Capillary: 102 mg/dL — ABNORMAL HIGH (ref 70–99)
Glucose-Capillary: 89 mg/dL (ref 70–99)

## 2021-04-15 MED ORDER — IRBESARTAN 75 MG PO TABS
37.5000 mg | ORAL_TABLET | Freq: Every day | ORAL | Status: DC
  Administered 2021-04-15 – 2021-04-17 (×3): 37.5 mg via ORAL
  Filled 2021-04-15 (×3): qty 1

## 2021-04-15 NOTE — Progress Notes (Signed)
Occupational Therapy Treatment Patient Details Name: Sherri Dixon MRN: [id number] DOB: [birth year] Today's Date: 04/15/2021    History of present illness 40 y.o. female with medical history significant of asthma, chronic diastolic CHF, hypertension, hyperlipidemia, insulin-dependent diabetes melitis, chronic microcytic anemia, GERD, history of PE on Xarelto, morbid obesity (BMI 62.19), admitted for stroke in May 2022 presented to the ED with complaints of right foot pain, swelling, and drainage after she stepped on a piece of glass. 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   OT comments  Pt making good progress with functional goals. Pt seated EOB upon arrival. Pt abe to lateral scoot to drop arm recliner min A +2 using pads underneath to assist with scoot, increased time and effort to complete. Pt able to use armrests with B UE to push/scoot self towards back of recliner. Pt able to perform grooming/hygiene with set up, leaning side to side for simulated LB selfcare tasks mod - max A. Pt very pleasant, cooperative and motivated to participate with therapy. OT will continue to follow acutely to maximize level of function and safety   Follow Up Recommendations  CIR    Equipment Recommendations  Other (comment);3 in 1 bedside commode;Wheelchair (measurements OT);Wheelchair cushion (measurements OT)    Recommendations for Other Services      Precautions / Restrictions Precautions Precautions: Fall;Other (comment) Precaution Comments: Wound VAC Restrictions Weight Bearing Restrictions: Yes RLE Weight Bearing: Non weight bearing       Mobility Bed Mobility               General bed mobility comments: Pt seated EOB upon arrival initiating  lateral scoot to recliner with PT    Transfers Overall transfer level: Needs assistance   Transfers: Lateral/Scoot Transfers          Lateral/Scoot Transfers: Min assist;+2 physical assistance General transfer comment: min A +2  lateral scoot to drop arm recliner from EOB. Pt able to use UEs on armrests to scoot/push self to back of chair    Balance Overall balance assessment: Needs assistance Sitting-balance support: Feet supported;No upper extremity supported;Bilateral upper extremity supported Sitting balance-Leahy Scale: Good Sitting balance - Comments: Pt able to use UEs on armrests to scoot/push self to back of chair                                   ADL either performed or assessed with clinical judgement   ADL Overall ADL's : Needs assistance/impaired     Grooming: Wash/dry hands;Wash/dry face;Sitting;Supervision/safety;Set up   Upper Body Bathing: Set up;Supervision/ safety;Sitting Upper Body Bathing Details (indicate cue type and reason): simulated Lower Body Bathing: Maximal assistance;Moderate assistance;Sitting/lateral leans Lower Body Bathing Details (indicate cue type and reason): simulated seated in recliner Upper Body Dressing : Set up;Supervision/safety;Sitting       Toilet Transfer: Minimal assistance;+2 for physical assistance;Cueing for sequencing;Cueing for safety Toilet Transfer Details (indicate cue type and reason): simulated with lateral scoot to drop arm recliner Toileting- Clothing Manipulation and Hygiene: Maximal assistance;Sitting/lateral lean Toileting - Clothing Manipulation Details (indicate cue type and reason): clothing mgt mod A leaning side to side       General ADL Comments: pt seated in recliner for groomimh/hygiene, LB bathing tasks leaning side to side     Vision Baseline Vision/History: Wears glasses Patient Visual Report: No change from baseline     Perception     Praxis      Cognition  Arousal/Alertness: Awake/alert Behavior During Therapy: WFL for tasks assessed/performed Overall Cognitive Status: Within Functional Limits for tasks assessed                                          Exercises     Shoulder  Instructions       General Comments      Pertinent Vitals/ Pain       Pain Assessment: Faces Faces Pain Scale: Hurts little more Pain Location: R foot Pain Descriptors / Indicators: Grimacing;Guarding;Discomfort Pain Intervention(s): Premedicated before session;Monitored during session;Repositioned  Home Living Family/patient expects to be discharged to:: Inpatient rehab Living Arrangements: Spouse/significant other Available Help at Discharge: Family;Available PRN/intermittently Type of Home: House Home Access: Stairs to enter [employer] of Steps: 3+1                              Prior Functioning/Environment              Frequency  Min 2X/week        Progress Toward Goals  OT Goals(current goals can now be found in the care plan section)  Progress towards OT goals: Progressing toward goals  Acute Rehab OT Goals Patient Stated Goal: get stronger and go home  Plan Discharge plan remains appropriate    Co-evaluation                 AM-PAC OT "6 Clicks" Daily Activity     Outcome Measure   Help from another person eating meals?: None Help from another person taking care of personal grooming?: None (set up) Help from another person toileting, which includes using toliet, bedpan, or urinal?: Total Help from another person bathing (including washing, rinsing, drying)?: A Lot Help from another person to put on and taking off regular upper body clothing?: A Little Help from another person to put on and taking off regular lower body clothing?: Total 6 Click Score: 15    End of Session Equipment Utilized During Treatment: Gait belt  OT Visit Diagnosis: Other abnormalities of gait and mobility (R26.89);Muscle weakness (generalized) (M62.81);Pain Pain - Right/Left: Right Pain - part of body: Ankle and joints of foot   Activity Tolerance Patient tolerated treatment well   Patient Left with call bell/phone within reach;in chair;with  chair alarm set   Nurse Communication Mobility status        Time: 1415-1446 OT Time Calculation (min): 31 min  Charges: OT Treatments $Self Care/Home Management : 8-22 mins     Britt Bottom 04/15/2021, 4:47 PM

## 2021-04-15 NOTE — Progress Notes (Signed)
Physical Therapy Treatment Patient Details Name: Sherri Dixon MRN: [id number] DOB: 02-13-1981 Today's Date: 04/15/2021    History of Present Illness 40 y.o. female with medical history significant of asthma, chronic diastolic CHF, hypertension, hyperlipidemia, insulin-dependent diabetes melitis, chronic microcytic anemia, GERD, history of PE on Xarelto, morbid obesity (BMI 62.19), admitted for stroke in May 2022 presented to the ED with complaints of right foot pain, swelling, and drainage after she stepped on a piece of glass. 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    PT Comments    Pt was seen to set up on side of bed by PT but demonstrates a good awareness of how she should move.  Pt initially struggled with moving sliding to R, switched to L side and successfully got to chair with 2 min assist on bed pad.  Pt is doubtful of her abilities but encouraged her to keep working and to have nursing staff keep her moving and assisting her.  Pt is able to reposition sitting on side of bed, to scoot back on chair and to pull her legs up to reduce wb on RLE especially.  Instructed nurse on transfers with  1) maxi move 2) sliding laterally and 3) AP transfer back to bed.   Follow Up Recommendations  CIR     Equipment Recommendations  None recommended by PT    Recommendations for Other Services Rehab consult     Precautions / Restrictions Precautions Precautions: Fall Precaution Comments: wound vac Restrictions Weight Bearing Restrictions: Yes RLE Weight Bearing: Non weight bearing    Mobility  Bed Mobility Overal bed mobility: Needs Assistance Bed Mobility: Supine to Sit Rolling: Mod assist   Supine to sit: Mod assist     General bed mobility comments: pt is able to sit with good balance on side of bed    Transfers Overall transfer level: Needs assistance   Transfers: Lateral/Scoot Transfers          Lateral/Scoot Transfers: Min assist;+2 physical assistance;+2  safety/equipment;From elevated surface General transfer comment: min A +2 lateral scoot to drop arm recliner from EOB. Pt able to use UEs on armrests to scoot/push self to back of chair  Ambulation/Gait             General Gait Details: unable   Stairs             Wheelchair Mobility    Modified Rankin (Stroke Patients Only)       Balance Overall balance assessment: Needs assistance Sitting-balance support: Feet supported Sitting balance-Leahy Scale: Good Sitting balance - Comments: Pt able to use UEs on armrests to scoot/push self to back of chair                                    Cognition Arousal/Alertness: Awake/alert Behavior During Therapy: WFL for tasks assessed/performed Overall Cognitive Status: Within Functional Limits for tasks assessed                                        Exercises      General Comments General comments (skin integrity, edema, etc.): pt is assisted to side of bed and then set up to laterally scoot to chair.  Pt is aware of the sequence but needs some close supervised cues to succeed      Pertinent Vitals/Pain Pain  Assessment: Faces Faces Pain Scale: Hurts little more Pain Location: R foot Pain Descriptors / Indicators: Grimacing;Guarding;Discomfort Pain Intervention(s): Limited activity within patient's tolerance;Monitored during session;Premedicated before session;Repositioned    Home Living Family/patient expects to be discharged to:: Inpatient rehab Living Arrangements: Spouse/significant other Available Help at Discharge: Family;Available PRN/intermittently Type of Home: House Home Access: Stairs to enter            Prior Function            PT Goals (current goals can now be found in the care plan section) Acute Rehab PT Goals Patient Stated Goal: get to rehab after hosp Progress towards PT goals: Progressing toward goals    Frequency    Min 5X/week      PT Plan  Current plan remains appropriate    Co-evaluation              AM-PAC PT "6 Clicks" Mobility   Outcome Measure  Help needed turning from your back to your side while in a flat bed without using bedrails?: A Little Help needed moving from lying on your back to sitting on the side of a flat bed without using bedrails?: A Little Help needed moving to and from a bed to a chair (including a wheelchair)?: A Lot Help needed standing up from a chair using your arms (e.g., wheelchair or bedside chair)?: Total Help needed to walk in hospital room?: Total Help needed climbing 3-5 steps with a railing? : Total 6 Click Score: 11    End of Session Equipment Utilized During Treatment: Gait belt Activity Tolerance: Patient limited by pain;Patient limited by fatigue Patient left: in chair;with call bell/phone within reach Nurse Communication: Mobility status;Need for lift equipment PT Visit Diagnosis: Unsteadiness on feet (R26.81);Pain Pain - Right/Left: Right Pain - part of body: Ankle and joints of foot     Time: 1405-1446 PT Time Calculation (min) (ACUTE ONLY): 41 min  Charges:  $Therapeutic Activity: 23-37 mins                   Ramond Dial 04/15/2021, 4:54 PM  Mee Hives, PT MS Acute Rehab Dept. Number: [place] and [place]

## 2021-04-15 NOTE — Progress Notes (Signed)
[facility] PROGRESS NOTE    Progress Note  Sherri Dixon  WIO:[id number] DOB: December 31, 1980 DOA: 04/05/2021 PCP: Tomasa Hose, NP     Brief Narrative:   Sherri Dixon is an 40 y.o. female past medical history of asthma, chronic diastolic heart failure, essential hypertension diabetes mellitus insulin-dependent, microcytic anemia history of PE on Xarelto comes in for right foot pain and swelling after stepping on a piece of glass about a week ago.  Was found to have a necrotic infected foot orthopedic surgery was consulted she was taken to the OR with irrigation and debridement and wound VAC placement.  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   Blood cultures from 04/06/2021 have remained negative till date. Wound culture from OR grew Streptococcus gallolyticus and Proteus  She is status post transmetatarsal imitation 04/12/2021 surgical pathology specimen sent. Wounda vac in place. Bearing weights as tolerate it. PICC line inserted on 04/15/2019, she will need 6 weeks of antibiotics, her tentatively end date is 05/24/2021, Now on Rocephin and Flagyl. Awaiting inpatient rehab evaluation.  Acute kidney injury: In the setting of sepsis and ARB use.  With a baseline creatinine of 1.1-1.3 KVO IV fluids patient able to hydrate orally.  Hypokalemia: Replete orally now resolved.  Microcytic anemia: In a menstruating female with heavy periods on Xarelto.  She is now on IV heparin. She status post 4 units of packed red blood cells her hemoglobin today is 8.6.  History of PE: Xarelto initially held we have resumed it orally she has been tolerating it.  Chronic diastolic heart failure: Appears euvolemic resume Lasix and other heart failure medications.  Hyperlipidemia:  continue  Crestor.  Diabetes mellitus type 2: With an A1c of 8.9, blood glucose is well controlled in house continue sliding scale.  History of PE/history of CVA: Currently on a heparin drip, Xarelto has been held . Resume Xarelto.  Essential hypertension: ARB Held due to acute kidney injury. Continue metoprolol and hydralazine.   DVT prophylaxis: heparin Family Communication:husband Status is: Inpatient  Remains inpatient appropriate because:Hemodynamically unstable  Dispo: The patient is from: Home              Anticipated d/c is to: Home              Patient currently is not medically stable to d/c.   Difficult to place patient No        Code Status:     Code Status Orders  (From admission, onward)           Start     Ordered   04/05/21 2049  Full code  Continuous        04/05/21 2051           Code Status History     Date Active Date Inactive Code Status Order ID Comments User Context   01/25/2021 1948 01/28/2021 1939 Full Code [id number]  Rise Patience, MD ED   12/12/2020 2345 12/15/2020 1717 Full Code [id number]  Rise Patience, MD ED   07/27/2019 1646 07/31/2019 2132 Full Code [id number]  Cleon Gustin, MD Inpatient   03/10/2019 1030 03/11/2019 1559 Full Code [id number]  Domenic Polite, MD ED   05/22/2018 1455 05/24/2018 1917 Full Code [id number]  Debbe Odea, MD ED   06/05/2016 1613 06/06/2016 1540 Full Code [id number]  Caren Griffins,  MD ED         IV Access:   Peripheral IV   Procedures and diagnostic studies:   No results found.   Medical Consultants:   None.   Subjective:    Sherri Dixon pain is controlled no further complaints.  Objective:    Vitals:   04/14/21 1315 04/14/21 2100 04/15/21 0619 04/15/21 0800  BP: 128/65 122/87 (!) 176/89 (!) 155/79  Pulse: 68 70 81 76  Resp: 18 18 18 16   Temp: 99.1 F (37.3 C) 98.6 F (37 C) 98 F (36.7 C) 97.7 F (36.5 C)  TempSrc: Oral Oral Oral Oral  SpO2: 100% 100% 100%  100%  Weight:      Height:       SpO2: 100 % O2 Flow Rate (L/min): 5 L/min   Intake/Output Summary (Last 24 hours) at 04/15/2021 1010 Last data filed at 04/15/2021 0740 Gross per 24 hour  Intake 805 ml  Output 2750 ml  Net -1945 ml    Filed Weights   04/06/21 1108 04/08/21 0500 04/09/21 0451  Weight: (!) 154.2 kg (!) 139.2 kg (!) 139 kg    Exam: General exam: In no acute distress. Respiratory system: Good air movement and clear to auscultation. Cardiovascular system: S1 & S2 heard, RRR. No JVD. Gastrointestinal system: Abdomen is nondistended, soft and nontender.  Extremities: No pedal edema. Skin: No rashes, lesions or ulcers  Data Reviewed:    Labs: Basic Metabolic Panel: Recent Labs  Lab 04/10/21 0243 04/11/21 0022 04/12/21 0217 04/13/21 0544 04/14/21 0917  NA 136 134* 136 135 136  K 3.7 3.8 3.8 4.2 3.7  CL 107 105 106 107 104  CO2 22 22 25 25 26   GLUCOSE 146* 92 140* 196* 110*  BUN 21* 19 17 19 20   CREATININE 1.64* 1.43* 1.34* 1.50* 1.43*  CALCIUM 7.3* 7.1* 7.6* 7.6* 8.0*    GFR Estimated Creatinine Clearance: 69.3 mL/min (A) (by C-G formula based on SCr of 1.43 mg/dL (H)). Liver Function Tests: No results for input(s): AST, ALT, ALKPHOS, BILITOT, PROT, ALBUMIN in the last 168 hours.  No results for input(s): LIPASE, AMYLASE in the last 168 hours. No results for input(s): AMMONIA in the last 168 hours. Coagulation profile No results for input(s): INR, PROTIME in the last 168 hours.  COVID-19 Labs  No results for input(s): DDIMER, FERRITIN, LDH, CRP in the last 72 hours.   Lab Results  Component Value Date   [place] NEGATIVE 04/05/2021   [place] NEGATIVE 01/25/2021   [place] NEGATIVE 12/12/2020   SARSCOV2NAA NOT DETECTED 07/25/2019    CBC: Recent Labs  Lab 04/10/21 0243 04/10/21 1541 04/11/21 0022 04/12/21 0217 04/13/21 0544 04/13/21 2115 04/14/21 1515 04/15/21 0320  WBC 18.4*  --  22.8* 18.9* 24.7*  --   --  20.4*   HGB 6.3*   < > 7.4* 7.4* 6.8* 6.6* 9.0* 8.6*  HCT 20.1*   < > 23.5* 22.7* 22.1* 21.4* 27.6* 26.5*  MCV 85.5  --  85.8 84.4 87.7  --   --  86.6  PLT 284  --  289 274 320  --   --  393   < > = values in this interval not displayed.    Cardiac Enzymes: Recent Labs  Lab 04/08/21 1246  CKTOTAL 22*    BNP (last 3 results) No results for input(s): PROBNP in the last 8760 hours. CBG: Recent Labs  Lab 04/14/21 0846 04/14/21 1241 04/14/21 1806 04/14/21 2119 04/15/21 0757  GLUCAP 103* 120*  101* 88 108*    D-Dimer: No results for input(s): DDIMER in the last 72 hours. Hgb A1c: No results for input(s): HGBA1C in the last 72 hours.  Lipid Profile: No results for input(s): CHOL, HDL, LDLCALC, TRIG, CHOLHDL, LDLDIRECT in the last 72 hours. Thyroid function studies: No results for input(s): TSH, T4TOTAL, T3FREE, THYROIDAB in the last 72 hours.  Invalid input(s): FREET3 Anemia work up: No results for input(s): VITAMINB12, FOLATE, FERRITIN, TIBC, IRON, RETICCTPCT in the last 72 hours. Sepsis Labs: Recent Labs  Lab 04/11/21 0022 04/12/21 0217 04/13/21 0544 04/15/21 0320  WBC 22.8* 18.9* 24.7* 20.4*    Microbiology Recent Results (from the past 240 hour(s))  Resp Panel by RT-PCR (Flu A&B, Covid) Nasopharyngeal Swab     Status: None   Collection Time: 04/05/21  4:40 PM   Specimen: Nasopharyngeal Swab; Nasopharyngeal(NP) swabs in vial transport medium  Result Value Ref Range Status   SARS Coronavirus 2 by RT PCR NEGATIVE NEGATIVE Final    Comment: (NOTE) SARS-CoV-2 target nucleic acids are NOT DETECTED.  The SARS-CoV-2 RNA is generally detectable in upper respiratory specimens during the acute phase of infection. The lowest concentration of SARS-CoV-2 viral copies this assay can detect is 138 copies/mL. A negative result does not preclude SARS-Cov-2 infection and should not be used as the sole basis for treatment or other patient management decisions. A negative result may  occur with  improper specimen collection/handling, submission of specimen other than nasopharyngeal swab, presence of viral mutation(s) within the areas targeted by this assay, and inadequate number of viral copies(<138 copies/mL). A negative result must be combined with clinical observations, patient history, and epidemiological information. The expected result is Negative.  Fact Sheet for Patients:  EntrepreneurPulse.com.au  Fact Sheet for Healthcare Providers:  IncredibleEmployment.be  This test is no t yet approved or cleared by the [place] FDA and  has been authorized for detection and/or diagnosis of SARS-CoV-2 by FDA under an Emergency Use Authorization (EUA). This EUA will remain  in effect (meaning this test can be used) for the duration of the COVID-19 declaration under Section 564(b)(1) of the Act, 21 U.S.C.section 360bbb-3(b)(1), unless the authorization is terminated  or revoked sooner.       Influenza A by PCR NEGATIVE NEGATIVE Final   Influenza B by PCR NEGATIVE NEGATIVE Final    Comment: (NOTE) The Xpert Xpress SARS-CoV-2/FLU/RSV plus assay is intended as an aid in the diagnosis of influenza from Nasopharyngeal swab specimens and should not be used as a sole basis for treatment. Nasal washings and aspirates are unacceptable for Xpert Xpress SARS-CoV-2/FLU/RSV testing.  Fact Sheet for Patients: EntrepreneurPulse.com.au  Fact Sheet for Healthcare Providers: IncredibleEmployment.be  This test is not yet approved or cleared by the [place] FDA and has been authorized for detection and/or diagnosis of SARS-CoV-2 by FDA under an Emergency Use Authorization (EUA). This EUA will remain in effect (meaning this test can be used) for the duration of the COVID-19 declaration under Section 564(b)(1) of the Act, 21 U.S.C. section 360bbb-3(b)(1), unless the authorization is terminated  or revoked.  Performed at [facility], [place] [address], [place], [place] [address]   Blood Culture (routine x 2)     Status: None   Collection Time: 04/05/21  5:23 PM   Specimen: BLOOD  Result Value Ref Range Status   Specimen Description   Final    BLOOD BLOOD RIGHT ARM Performed at [facility] [address], [place], [place] [address]  Special Requests   Final    BOTTLES DRAWN AEROBIC ONLY Blood Culture adequate volume Performed at [facility] [address] [place], [place] [address]    Culture   Final    NO GROWTH 5 DAYS Performed at [facility], [place] [address] [place], [place] [address]    Report Status 04/10/2021 FINAL  Final  Blood Culture (routine x 2)     Status: None   Collection Time: 04/05/21  7:30 PM   Specimen: BLOOD  Result Value Ref Range Status   Specimen Description   Final    BLOOD RIGHT ANTECUBITAL Performed at [facility] [address] [place], [place] [address]    Special Requests   Final    BOTTLES DRAWN AEROBIC AND ANAEROBIC Blood Culture adequate volume Performed at [facility] [address], [place], [place] [address]    Culture   Final    NO GROWTH 5 DAYS Performed at [facility], [place] [address], [place], [place] [address]    Report Status 04/10/2021 FINAL  Final  Surgical pcr screen     Status: Abnormal   Collection Time: 04/06/21  8:55 AM   Specimen: Nasal Mucosa; Nasal Swab  Result Value Ref Range Status   MRSA, PCR NEGATIVE NEGATIVE Final   Staphylococcus aureus POSITIVE (A) NEGATIVE Final    Comment: (NOTE) The Xpert SA Assay (FDA approved for NASAL specimens in patients 55 years of age and older), is one component of a comprehensive surveillance program. It is not intended to diagnose infection nor to guide or monitor treatment. Performed at [facility], [place] [address] [place], [place] [address]    Aerobic/Anaerobic Culture w Gram Stain (surgical/deep wound)     Status: None   Collection Time: 04/06/21 11:44 AM   Specimen: Abscess  Result Value Ref Range Status   Specimen Description ABSCESS  Final   Special Requests RIGHT FOOT ABSCESS SPEC A  Final   Gram Stain   Final    NO WBC SEEN MODERATE GRAM POSITIVE COCCI IN PAIRS AND CHAINS IN TETRADS MODERATE GRAM NEGATIVE RODS FEW GRAM POSITIVE RODS    Culture   Final    ABUNDANT PROTEUS MIRABILIS MODERATE STREPTOCOCCUS GALLOLYTICUS NO ANAEROBES ISOLATED Performed at [facility], [place] [address] [place], [place] [address]    Report Status 04/11/2021 FINAL  Final   Organism ID, Bacteria PROTEUS MIRABILIS  Final   Organism ID, Bacteria STREPTOCOCCUS GALLOLYTICUS  Final      Susceptibility   Proteus mirabilis - MIC*    AMPICILLIN <=2 SENSITIVE Sensitive     CEFAZOLIN 8 SENSITIVE Sensitive     CEFEPIME <=0.12 SENSITIVE Sensitive     CEFTAZIDIME <=1 SENSITIVE Sensitive     CEFTRIAXONE <=0.25 SENSITIVE Sensitive     CIPROFLOXACIN <=0.25 SENSITIVE Sensitive     GENTAMICIN <=1 SENSITIVE Sensitive     IMIPENEM 4 SENSITIVE Sensitive     TRIMETH/SULFA <=20 SENSITIVE Sensitive     AMPICILLIN/SULBACTAM <=2 SENSITIVE Sensitive     PIP/TAZO <=4 SENSITIVE Sensitive     * ABUNDANT PROTEUS MIRABILIS   Streptococcus gallolyticus - MIC*    PENICILLIN 0.12 SENSITIVE Sensitive     CEFTRIAXONE 0.25 SENSITIVE Sensitive     ERYTHROMYCIN >=8 RESISTANT Resistant     LEVOFLOXACIN 4 INTERMEDIATE Intermediate     VANCOMYCIN 0.25 SENSITIVE Sensitive     * MODERATE STREPTOCOCCUS GALLOLYTICUS     Medications:  sodium chloride   Intravenous Once   Chlorhexidine Gluconate Cloth  6 each Topical Daily   docusate sodium  100 mg Oral Daily   furosemide  80 mg Oral BID   hydrALAZINE  50 mg Oral TID   insulin aspart  0-5 Units Subcutaneous QHS   insulin aspart  0-9 Units Subcutaneous TID WC   insulin aspart  3 Units Subcutaneous TID WC    metoprolol succinate  100 mg Oral Daily   metroNIDAZOLE  500 mg Oral Q8H   pantoprazole  40 mg Oral q1800   rivaroxaban  20 mg Oral Daily   rosuvastatin  40 mg Oral Daily   sodium chloride flush  10-40 mL Intracatheter Q12H   Continuous Infusions:  cefTRIAXone (ROCEPHIN)  IV 2 g (04/14/21 2128)   magnesium sulfate bolus IVPB        LOS: 10 days   Sherri Dixon  [facility]  04/15/2021, 10:10 AM

## 2021-04-15 NOTE — Progress Notes (Signed)
Inpatient Rehabilitation Admissions Coordinator   I await updated PT and OT assessments to begin Auth for a possible CIR admit.  Danne Baxter, RN, MSN Rehab Admissions Coordinator [phone] 04/15/2021 1:23 PM

## 2021-04-16 ENCOUNTER — Encounter: Payer: Self-pay | Admitting: Adult Health

## 2021-04-16 ENCOUNTER — Inpatient Hospital Stay: Payer: 59 | Admitting: Adult Health

## 2021-04-16 DIAGNOSIS — L089 Local infection of the skin and subcutaneous tissue, unspecified: Secondary | ICD-10-CM | POA: Diagnosis not present

## 2021-04-16 LAB — CBC
HCT: 25.6 % — ABNORMAL LOW (ref 36.0–46.0)
Hemoglobin: 8.1 g/dL — ABNORMAL LOW (ref 12.0–15.0)
MCH: 28.1 pg (ref 26.0–34.0)
MCHC: 31.6 g/dL (ref 30.0–36.0)
MCV: 88.9 fL (ref 80.0–100.0)
Platelets: 464 10*3/uL — ABNORMAL HIGH (ref 150–400)
RBC: 2.88 MIL/uL — ABNORMAL LOW (ref 3.87–5.11)
RDW: 15.8 % — ABNORMAL HIGH (ref 11.5–15.5)
WBC: 19.1 10*3/uL — ABNORMAL HIGH (ref 4.0–10.5)
nRBC: 0 % (ref 0.0–0.2)

## 2021-04-16 LAB — GLUCOSE, CAPILLARY
Glucose-Capillary: 102 mg/dL — ABNORMAL HIGH (ref 70–99)
Glucose-Capillary: 98 mg/dL (ref 70–99)
Glucose-Capillary: 115 mg/dL — ABNORMAL HIGH (ref 70–99)
Glucose-Capillary: 137 mg/dL — ABNORMAL HIGH (ref 70–99)

## 2021-04-16 LAB — SURGICAL PATHOLOGY

## 2021-04-16 NOTE — Progress Notes (Signed)
[facility] PROGRESS NOTE    Progress Note  Sherri Dixon  KGM:[id number] DOB: 12-22-1980 DOA: 04/05/2021 PCP: Tomasa Hose, NP     Brief Narrative:   Sherri Dixon is an 40 y.o. female past medical history of asthma, chronic diastolic heart failure, essential hypertension diabetes mellitus insulin-dependent, microcytic anemia history of PE on Xarelto comes in for right foot pain and swelling after stepping on a piece of glass about a week ago.  Was found to have a necrotic infected foot orthopedic surgery was consulted she was taken to the OR with irrigation and debridement and wound VAC placement.  Antibiotics: Rocephin and Flagyl started on 04/13/2021  Microbiology data: Blood culture: Negative till date  Procedures: None  Awaiting insurance approval to be transferred to inpatient rehab  Assessment/Plan:   Sepsis due to necrotizing Right foot infection/osteomyelitis: I&D performed on 04/06/2021.   Blood cultures from 04/06/2021 have remained negative till date. Wound culture from OR grew Streptococcus gallolyticus and Proteus  She is status post transmetatarsal imitation 04/12/2021 surgical pathology specimen sent. Wounda vac in place. Bearing weights as tolerate it. PICC line inserted on 04/15/2019, she will need 6 weeks of antibiotics, her tentatively end date is 05/24/2021, Now on Rocephin and Flagyl.  Acute kidney injury: In the setting of sepsis and ARB use.  With a baseline creatinine of 1.1-1.3 KVO IV fluids patient able to hydrate orally.  Hypokalemia: Replete orally now resolved.  Microcytic anemia: In a menstruating female with heavy periods on Xarelto.  She is now on IV heparin. She status post 4 units of packed red blood cells her hemoglobin today is 8.6.  History of PE: Xarelto initially held we have resumed it orally she has been tolerating it.  Chronic diastolic heart failure: Appears euvolemic resume Lasix and other heart failure  medications.  Hyperlipidemia:  continue Crestor.  Diabetes mellitus type 2: With an A1c of 8.9, blood glucose is well controlled in house continue sliding scale.  History of PE/history of CVA: Currently on a heparin drip, Xarelto has been held . Resume Xarelto.  Essential hypertension: ARB Held due to acute kidney injury. Continue metoprolol and hydralazine.   DVT prophylaxis: heparin Family Communication:husband Status is: Inpatient  Remains inpatient appropriate because:Hemodynamically unstable  Dispo: The patient is from: Home              Anticipated d/c is to: Home              Patient currently is not medically stable to d/c.   Difficult to place patient No        Code Status:     Code Status Orders  (From admission, onward)           Start     Ordered   04/05/21 2049  Full code  Continuous        04/05/21 2051           Code Status History     Date Active Date Inactive Code Status Order ID Comments User Context   01/25/2021 1948 01/28/2021 1939 Full Code [id number]  Rise Patience, MD ED   12/12/2020 2345 12/15/2020 1717 Full Code [id number]  Rise Patience, MD ED   07/27/2019 1646 07/31/2019 2132 Full Code [id number]  Cleon Gustin, MD Inpatient   03/10/2019 1030 03/11/2019 1559 Full Code [id number]  Domenic Polite, MD ED   05/22/2018 1455 05/24/2018 1917 Full Code [id number]  Debbe Odea, MD ED   06/05/2016 1613 06/06/2016 1540 Full  Code [id number]  Caren Griffins, MD ED         IV Access:   Peripheral IV   Procedures and diagnostic studies:   No results found.   Medical Consultants:   None.   Subjective:    Sherri Dixon no complaints  Objective:    Vitals:   04/16/21 0011 04/16/21 0336 04/16/21 0337 04/16/21 0801  BP: (!) 158/82 (!) 165/78 (!) 165/78 (!) 150/82  Pulse: 71 71 71 72  Resp: 18  18 18   Temp: 98.4 F (36.9 C) 98 F (36.7 C) 98 F (36.7 C) 98 F (36.7 C)  TempSrc: Oral Oral Oral Oral  SpO2:  100% 100% 100% 98%  Weight:   134.8 kg   Height:       SpO2: 98 % O2 Flow Rate (L/min): 5 L/min   Intake/Output Summary (Last 24 hours) at 04/16/2021 1040 Last data filed at 04/16/2021 0802 Gross per 24 hour  Intake 120 ml  Output 2450 ml  Net -2330 ml    Filed Weights   04/08/21 0500 04/09/21 0451 04/16/21 0337  Weight: (!) 139.2 kg (!) 139 kg 134.8 kg    Exam: General exam: In no acute distress. Respiratory system: Good air movement and clear to auscultation. Cardiovascular system: S1 & S2 heard, RRR. No JVD. Gastrointestinal system: Abdomen is nondistended, soft and nontender.  Extremities: No pedal edema. Skin: No rashes, lesions or ulcers  Data Reviewed:    Labs: Basic Metabolic Panel: Recent Labs  Lab 04/10/21 0243 04/11/21 0022 04/12/21 0217 04/13/21 0544 04/14/21 0917  NA 136 134* 136 135 136  K 3.7 3.8 3.8 4.2 3.7  CL 107 105 106 107 104  CO2 22 22 25 25 26   GLUCOSE 146* 92 140* 196* 110*  BUN 21* 19 17 19 20   CREATININE 1.64* 1.43* 1.34* 1.50* 1.43*  CALCIUM 7.3* 7.1* 7.6* 7.6* 8.0*    GFR Estimated Creatinine Clearance: 68 mL/min (A) (by C-G formula based on SCr of 1.43 mg/dL (H)). Liver Function Tests: No results for input(s): AST, ALT, ALKPHOS, BILITOT, PROT, ALBUMIN in the last 168 hours.  No results for input(s): LIPASE, AMYLASE in the last 168 hours. No results for input(s): AMMONIA in the last 168 hours. Coagulation profile No results for input(s): INR, PROTIME in the last 168 hours.  COVID-19 Labs  No results for input(s): DDIMER, FERRITIN, LDH, CRP in the last 72 hours.   Lab Results  Component Value Date   [place] NEGATIVE 04/05/2021   [place] NEGATIVE 01/25/2021   [place] NEGATIVE 12/12/2020   SARSCOV2NAA NOT DETECTED 07/25/2019    CBC: Recent Labs  Lab 04/11/21 0022 04/12/21 0217 04/13/21 0544 04/13/21 2115 04/14/21 1515 04/15/21 0320 04/16/21 0413  WBC 22.8* 18.9* 24.7*  --   --  20.4* 19.1*  HGB  7.4* 7.4* 6.8* 6.6* 9.0* 8.6* 8.1*  HCT 23.5* 22.7* 22.1* 21.4* 27.6* 26.5* 25.6*  MCV 85.8 84.4 87.7  --   --  86.6 88.9  PLT 289 274 320  --   --  393 464*    Cardiac Enzymes: No results for input(s): CKTOTAL, CKMB, CKMBINDEX, TROPONINI in the last 168 hours.  BNP (last 3 results) No results for input(s): PROBNP in the last 8760 hours. CBG: Recent Labs  Lab 04/15/21 0757 04/15/21 1302 04/15/21 1700 04/15/21 2247 04/16/21 0758  GLUCAP 108* 82 102* 89 102*    D-Dimer: No results for input(s): DDIMER in the last 72 hours. Hgb A1c: No results for input(s): HGBA1C  in the last 72 hours.  Lipid Profile: No results for input(s): CHOL, HDL, LDLCALC, TRIG, CHOLHDL, LDLDIRECT in the last 72 hours. Thyroid function studies: No results for input(s): TSH, T4TOTAL, T3FREE, THYROIDAB in the last 72 hours.  Invalid input(s): FREET3 Anemia work up: No results for input(s): VITAMINB12, FOLATE, FERRITIN, TIBC, IRON, RETICCTPCT in the last 72 hours. Sepsis Labs: Recent Labs  Lab 04/12/21 0217 04/13/21 0544 04/15/21 0320 04/16/21 0413  WBC 18.9* 24.7* 20.4* 19.1*    Microbiology Recent Results (from the past 240 hour(s))  Aerobic/Anaerobic Culture w Gram Stain (surgical/deep wound)     Status: None   Collection Time: 04/06/21 11:44 AM   Specimen: Abscess  Result Value Ref Range Status   Specimen Description ABSCESS  Final   Special Requests RIGHT FOOT ABSCESS SPEC A  Final   Gram Stain   Final    NO WBC SEEN MODERATE GRAM POSITIVE COCCI IN PAIRS AND CHAINS IN TETRADS MODERATE GRAM NEGATIVE RODS FEW GRAM POSITIVE RODS    Culture   Final    ABUNDANT PROTEUS MIRABILIS MODERATE STREPTOCOCCUS GALLOLYTICUS NO ANAEROBES ISOLATED Performed at [facility], [place] [address], [place], [place] [address]    Report Status 04/11/2021 FINAL  Final   Organism ID, Bacteria PROTEUS MIRABILIS  Final   Organism ID, Bacteria STREPTOCOCCUS GALLOLYTICUS  Final      Susceptibility    Proteus mirabilis - MIC*    AMPICILLIN <=2 SENSITIVE Sensitive     CEFAZOLIN 8 SENSITIVE Sensitive     CEFEPIME <=0.12 SENSITIVE Sensitive     CEFTAZIDIME <=1 SENSITIVE Sensitive     CEFTRIAXONE <=0.25 SENSITIVE Sensitive     CIPROFLOXACIN <=0.25 SENSITIVE Sensitive     GENTAMICIN <=1 SENSITIVE Sensitive     IMIPENEM 4 SENSITIVE Sensitive     TRIMETH/SULFA <=20 SENSITIVE Sensitive     AMPICILLIN/SULBACTAM <=2 SENSITIVE Sensitive     PIP/TAZO <=4 SENSITIVE Sensitive     * ABUNDANT PROTEUS MIRABILIS   Streptococcus gallolyticus - MIC*    PENICILLIN 0.12 SENSITIVE Sensitive     CEFTRIAXONE 0.25 SENSITIVE Sensitive     ERYTHROMYCIN >=8 RESISTANT Resistant     LEVOFLOXACIN 4 INTERMEDIATE Intermediate     VANCOMYCIN 0.25 SENSITIVE Sensitive     * MODERATE STREPTOCOCCUS GALLOLYTICUS     Medications:    sodium chloride   Intravenous Once   Chlorhexidine Gluconate Cloth  6 each Topical Daily   docusate sodium  100 mg Oral Daily   furosemide  80 mg Oral BID   hydrALAZINE  50 mg Oral TID   insulin aspart  0-5 Units Subcutaneous QHS   insulin aspart  0-9 Units Subcutaneous TID WC   insulin aspart  3 Units Subcutaneous TID WC   irbesartan  37.5 mg Oral Daily   metoprolol succinate  100 mg Oral Daily   metroNIDAZOLE  500 mg Oral Q8H   pantoprazole  40 mg Oral q1800   rivaroxaban  20 mg Oral Daily   rosuvastatin  40 mg Oral Daily   sodium chloride flush  10-40 mL Intracatheter Q12H   Continuous Infusions:  cefTRIAXone (ROCEPHIN)  IV 2 g (04/15/21 2226)   magnesium sulfate bolus IVPB        LOS: 11 days   Charlynne Cousins  [facility]  04/16/2021, 10:40 AM

## 2021-04-16 NOTE — Progress Notes (Signed)
  Inpatient Rehabilitation Admissions Coordinator   I await insurance determination for a possible Cir admit.  Danne Baxter, RN, MSN Rehab Admissions Coordinator [phone] 04/16/2021 12:09 PM

## 2021-04-16 NOTE — Progress Notes (Signed)
Physical Therapy Treatment Patient Details Name: Sherri Dixon MRN: [id number] DOB: Jul 22, 1981 Today's Date: 04/16/2021    History of Present Illness 40 y.o. female with medical history significant of asthma, chronic diastolic CHF, hypertension, hyperlipidemia, insulin-dependent diabetes melitis, chronic microcytic anemia, GERD, history of PE on Xarelto, morbid obesity (BMI 62.19), admitted for stroke in May 2022 presented to the ED with complaints of right foot pain, swelling, and drainage after she stepped on a piece of glass. 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    PT Comments    Pt motivated to move OOB to WC and WC to recliner.  Pt requiring min to mod assistance +2 from [facility] level.  Able to progress to [facility] mobility with rest periods.  Pt remains an excellent candidate for aggressive rehab in a post acute setting.      Follow Up Recommendations  CIR     Equipment Recommendations  None recommended by PT    Recommendations for Other Services       Precautions / Restrictions Precautions Precautions: Fall Precaution Comments: wound vac Restrictions Weight Bearing Restrictions: Yes RLE Weight Bearing: Non weight bearing Other Position/Activity Restrictions: residual L side weakness - from old CVA    Mobility  Bed Mobility Overal bed mobility: Needs Assistance Bed Mobility: Supine to Sit;Sit to Supine Rolling: Mod assist   Supine to sit: Min assist     General bed mobility comments: Increased time and effort to scoot to edge of bed.  Pt required min assistance to move hips to edge of bed.    Transfers Overall transfer level: Needs assistance Equipment used: Sliding board Transfers: Lateral/Scoot Transfers          Lateral/Scoot Transfers: +2 physical assistance;+2 safety/equipment;Min assist;Mod assist General transfer comment: Min +2 lateral scoot to the R from high to low surface ( bed to [facility] ).  Pt performed from [facility] to recliner with increased assistance of  mod +2.  L side noted to be more fatigued and required assistance to position L foot for leverage to push through during lateral slide board transfer.  Ambulation/Gait                 Theme park manager mobility: Yes Wheelchair propulsion: Both upper extremities Wheelchair parts: Needs assistance (Pt able to assist with locking and unlocking brakes but required total assistance with foot plates.  Pt able to use drive wheels for forward propulsion) Distance: 70 ft x 3 trials with rest breaks between trials. Wheelchair Assistance Details (indicate cue type and reason): Min assistance during forward propulsion to maintain straight path and for turns and backing.  Pt required decreased assistance with forward propulsion as trial progressed.  Modified Rankin (Stroke Patients Only)       Balance Overall balance assessment: Needs assistance Sitting-balance support: Feet supported Sitting balance-Leahy Scale: Good Sitting balance - Comments: Pt able to use UEs on armrests to scoot/push self to back of chair                                    Cognition Arousal/Alertness: Awake/alert Behavior During Therapy: WFL for tasks assessed/performed Overall Cognitive Status: Within Functional Limits for tasks assessed  Exercises      General Comments        Pertinent Vitals/Pain Pain Assessment: Faces Pain Score: 5  Pain Location: R foot Pain Descriptors / Indicators: Grimacing;Guarding;Discomfort Pain Intervention(s): Monitored during session;Repositioned    Home Living                      Prior Function            PT Goals (current goals can now be found in the care plan section) Acute Rehab PT Goals Patient Stated Goal: get to rehab after hosp Potential to Achieve Goals: Good Additional Goals Additional Goal #1: Propel wheelchair with  mod I for 200' with NWB on RLE Progress towards PT goals: Progressing toward goals    Frequency    Min 5X/week      PT Plan Current plan remains appropriate    Co-evaluation              AM-PAC PT "6 Clicks" Mobility   Outcome Measure  Help needed turning from your back to your side while in a flat bed without using bedrails?: A Lot Help needed moving from lying on your back to sitting on the side of a flat bed without using bedrails?: A Lot Help needed moving to and from a bed to a chair (including a wheelchair)?: A Lot Help needed standing up from a chair using your arms (e.g., wheelchair or bedside chair)?: Total Help needed to walk in hospital room?: Total Help needed climbing 3-5 steps with a railing? : Total 6 Click Score: 9    End of Session   Activity Tolerance: Patient tolerated treatment well Patient left: in chair;with call bell/phone within reach;with chair alarm set Nurse Communication: Mobility status;Need for lift equipment PT Visit Diagnosis: Unsteadiness on feet (R26.81);Pain Pain - Right/Left: Right Pain - part of body: Ankle and joints of foot     Time: 2993-7169 PT Time Calculation (min) (ACUTE ONLY): 38 min  Charges:  $Therapeutic Activity: 8-22 mins $Wheel Chair Management: 23-37 mins                     Erasmo Leventhal , PTA Acute Rehabilitation Services Pager [phone] Office [phone]    Quincey Quesinberry [name] 04/16/2021, 4:31 PM

## 2021-04-17 ENCOUNTER — Encounter (HOSPITAL_COMMUNITY): Payer: Self-pay | Admitting: Physical Medicine and Rehabilitation

## 2021-04-17 ENCOUNTER — Inpatient Hospital Stay (HOSPITAL_COMMUNITY)
Admission: RE | Admit: 2021-04-17 | Discharge: 2021-04-26 | DRG: 560 | Disposition: A | Discharge status: Home Health | Payer: 59 | Source: Intra-hospital | Attending: Physical Medicine and Rehabilitation | Admitting: Physical Medicine and Rehabilitation

## 2021-04-17 ENCOUNTER — Other Ambulatory Visit: Payer: Self-pay

## 2021-04-17 DIAGNOSIS — Z4781 Encounter for orthopedic aftercare following surgical amputation: Secondary | ICD-10-CM | POA: Diagnosis present

## 2021-04-17 DIAGNOSIS — N179 Acute kidney failure, unspecified: Secondary | ICD-10-CM | POA: Diagnosis present

## 2021-04-17 DIAGNOSIS — Z89431 Acquired absence of right foot: Secondary | ICD-10-CM | POA: Diagnosis not present

## 2021-04-17 DIAGNOSIS — E785 Hyperlipidemia, unspecified: Secondary | ICD-10-CM | POA: Diagnosis present

## 2021-04-17 DIAGNOSIS — E1169 Type 2 diabetes mellitus with other specified complication: Secondary | ICD-10-CM | POA: Diagnosis present

## 2021-04-17 DIAGNOSIS — Z794 Long term (current) use of insulin: Secondary | ICD-10-CM | POA: Diagnosis not present

## 2021-04-17 DIAGNOSIS — I5032 Chronic diastolic (congestive) heart failure: Secondary | ICD-10-CM | POA: Diagnosis present

## 2021-04-17 DIAGNOSIS — Z83438 Family history of other disorder of lipoprotein metabolism and other lipidemia: Secondary | ICD-10-CM

## 2021-04-17 DIAGNOSIS — Z7984 Long term (current) use of oral hypoglycemic drugs: Secondary | ICD-10-CM

## 2021-04-17 DIAGNOSIS — Z8261 Family history of arthritis: Secondary | ICD-10-CM | POA: Diagnosis not present

## 2021-04-17 DIAGNOSIS — E876 Hypokalemia: Secondary | ICD-10-CM | POA: Diagnosis not present

## 2021-04-17 DIAGNOSIS — Z86711 Personal history of pulmonary embolism: Secondary | ICD-10-CM

## 2021-04-17 DIAGNOSIS — L089 Local infection of the skin and subcutaneous tissue, unspecified: Secondary | ICD-10-CM | POA: Diagnosis present

## 2021-04-17 DIAGNOSIS — Z833 Family history of diabetes mellitus: Secondary | ICD-10-CM | POA: Diagnosis not present

## 2021-04-17 DIAGNOSIS — D638 Anemia in other chronic diseases classified elsewhere: Secondary | ICD-10-CM | POA: Diagnosis present

## 2021-04-17 DIAGNOSIS — Z79899 Other long term (current) drug therapy: Secondary | ICD-10-CM

## 2021-04-17 DIAGNOSIS — Z888 Allergy status to other drugs, medicaments and biological substances status: Secondary | ICD-10-CM

## 2021-04-17 DIAGNOSIS — E43 Unspecified severe protein-calorie malnutrition: Secondary | ICD-10-CM | POA: Diagnosis not present

## 2021-04-17 DIAGNOSIS — Z20822 Contact with and (suspected) exposure to covid-19: Secondary | ICD-10-CM | POA: Diagnosis present

## 2021-04-17 DIAGNOSIS — E559 Vitamin D deficiency, unspecified: Secondary | ICD-10-CM

## 2021-04-17 DIAGNOSIS — K219 Gastro-esophageal reflux disease without esophagitis: Secondary | ICD-10-CM | POA: Diagnosis present

## 2021-04-17 DIAGNOSIS — Z6841 Body Mass Index (BMI) 40.0 and over, adult: Secondary | ICD-10-CM

## 2021-04-17 DIAGNOSIS — D649 Anemia, unspecified: Secondary | ICD-10-CM | POA: Diagnosis present

## 2021-04-17 DIAGNOSIS — I110 Hypertensive heart disease with heart failure: Secondary | ICD-10-CM | POA: Diagnosis present

## 2021-04-17 DIAGNOSIS — Z8673 Personal history of transient ischemic attack (TIA), and cerebral infarction without residual deficits: Secondary | ICD-10-CM | POA: Diagnosis not present

## 2021-04-17 DIAGNOSIS — T8130XA Disruption of wound, unspecified, initial encounter: Secondary | ICD-10-CM | POA: Diagnosis not present

## 2021-04-17 DIAGNOSIS — E119 Type 2 diabetes mellitus without complications: Secondary | ICD-10-CM | POA: Diagnosis present

## 2021-04-17 LAB — CBC
HCT: 26.5 % — ABNORMAL LOW (ref 36.0–46.0)
Hemoglobin: 8.5 g/dL — ABNORMAL LOW (ref 12.0–15.0)
MCH: 28.4 pg (ref 26.0–34.0)
MCHC: 32.1 g/dL (ref 30.0–36.0)
MCV: 88.6 fL (ref 80.0–100.0)
Platelets: 519 10*3/uL — ABNORMAL HIGH (ref 150–400)
RBC: 2.99 MIL/uL — ABNORMAL LOW (ref 3.87–5.11)
RDW: 15.7 % — ABNORMAL HIGH (ref 11.5–15.5)
WBC: 15.4 10*3/uL — ABNORMAL HIGH (ref 4.0–10.5)
nRBC: 0 % (ref 0.0–0.2)

## 2021-04-17 LAB — GLUCOSE, CAPILLARY
Glucose-Capillary: 110 mg/dL — ABNORMAL HIGH (ref 70–99)
Glucose-Capillary: 96 mg/dL (ref 70–99)
Glucose-Capillary: 83 mg/dL (ref 70–99)
Glucose-Capillary: 120 mg/dL — ABNORMAL HIGH (ref 70–99)

## 2021-04-17 MED ORDER — HYDRALAZINE HCL 50 MG PO TABS
50.0000 mg | ORAL_TABLET | Freq: Three times a day (TID) | ORAL | Status: DC
  Administered 2021-04-17 – 2021-04-26 (×24): 50 mg via ORAL
  Filled 2021-04-17 (×27): qty 1

## 2021-04-17 MED ORDER — DOCUSATE SODIUM 100 MG PO CAPS
200.0000 mg | ORAL_CAPSULE | Freq: Every day | ORAL | Status: DC
  Administered 2021-04-19 – 2021-04-26 (×7): 200 mg via ORAL
  Filled 2021-04-17 (×9): qty 2

## 2021-04-17 MED ORDER — SODIUM CHLORIDE 0.9 % IV SOLN
2.0000 g | INTRAVENOUS | Status: DC
  Administered 2021-04-17 – 2021-04-25 (×9): 2 g via INTRAVENOUS
  Filled 2021-04-17: qty 20
  Filled 2021-04-17 (×5): qty 2
  Filled 2021-04-17: qty 20
  Filled 2021-04-17: qty 2
  Filled 2021-04-17: qty 20
  Filled 2021-04-17: qty 2

## 2021-04-17 MED ORDER — RIVAROXABAN 20 MG PO TABS
20.0000 mg | ORAL_TABLET | Freq: Every day | ORAL | Status: DC
  Administered 2021-04-18 – 2021-04-26 (×9): 20 mg via ORAL
  Filled 2021-04-17 (×10): qty 1

## 2021-04-17 MED ORDER — ZINC SULFATE 220 (50 ZN) MG PO CAPS
220.0000 mg | ORAL_CAPSULE | Freq: Every day | ORAL | Status: DC
  Administered 2021-04-17 – 2021-04-25 (×9): 220 mg via ORAL
  Filled 2021-04-17 (×9): qty 1

## 2021-04-17 MED ORDER — ALUM & MAG HYDROXIDE-SIMETH 200-200-20 MG/5ML PO SUSP
30.0000 mL | ORAL | Status: DC | PRN
  Administered 2021-04-20: 30 mL via ORAL
  Filled 2021-04-17: qty 30

## 2021-04-17 MED ORDER — TRAZODONE HCL 50 MG PO TABS: 25.0000 mg | ORAL_TABLET | Freq: Every evening | ORAL | Status: DC | PRN

## 2021-04-17 MED ORDER — JUVEN PO PACK
1.0000 | PACK | Freq: Two times a day (BID) | ORAL | Status: DC
  Administered 2021-04-18 – 2021-04-24 (×6): 1 via ORAL
  Filled 2021-04-17 (×18): qty 1

## 2021-04-17 MED ORDER — METRONIDAZOLE 500 MG PO TABS
500.0000 mg | ORAL_TABLET | Freq: Three times a day (TID) | ORAL | Status: DC
  Administered 2021-04-17 – 2021-04-26 (×25): 500 mg via ORAL
  Filled 2021-04-17 (×27): qty 1

## 2021-04-17 MED ORDER — ALUM & MAG HYDROXIDE-SIMETH 200-200-20 MG/5ML PO SUSP: 15.0000 mL | ORAL | Status: DC | PRN

## 2021-04-17 MED ORDER — METOPROLOL SUCCINATE ER 100 MG PO TB24
100.0000 mg | ORAL_TABLET | Freq: Every day | ORAL | Status: DC
  Administered 2021-04-18 – 2021-04-26 (×9): 100 mg via ORAL
  Filled 2021-04-17 (×9): qty 1

## 2021-04-17 MED ORDER — INSULIN ASPART 100 UNIT/ML IJ SOLN: 0.0000 [IU] | Freq: Every day | INTRAMUSCULAR | Status: DC

## 2021-04-17 MED ORDER — PANTOPRAZOLE SODIUM 40 MG PO TBEC
40.0000 mg | DELAYED_RELEASE_TABLET | Freq: Every day | ORAL | Status: DC
  Administered 2021-04-17 – 2021-04-25 (×9): 40 mg via ORAL
  Filled 2021-04-17 (×9): qty 1

## 2021-04-17 MED ORDER — PROCHLORPERAZINE EDISYLATE 10 MG/2ML IJ SOLN
5.0000 mg | Freq: Four times a day (QID) | INTRAMUSCULAR | Status: DC | PRN
  Administered 2021-04-17: 5 mg via INTRAMUSCULAR
  Filled 2021-04-17: qty 2

## 2021-04-17 MED ORDER — FUROSEMIDE 80 MG PO TABS
80.0000 mg | ORAL_TABLET | Freq: Two times a day (BID) | ORAL | Status: DC
  Administered 2021-04-17 – 2021-04-20 (×6): 80 mg via ORAL
  Filled 2021-04-17 (×6): qty 1

## 2021-04-17 MED ORDER — DIPHENHYDRAMINE HCL 12.5 MG/5ML PO ELIX: 12.5000 mg | ORAL_SOLUTION | Freq: Four times a day (QID) | ORAL | Status: DC | PRN

## 2021-04-17 MED ORDER — INSULIN ASPART 100 UNIT/ML IJ SOLN
3.0000 [IU] | Freq: Three times a day (TID) | INTRAMUSCULAR | Status: DC
  Administered 2021-04-18 – 2021-04-25 (×17): 3 [IU] via SUBCUTANEOUS

## 2021-04-17 MED ORDER — PANTOPRAZOLE SODIUM 40 MG PO TBEC: 40.0000 mg | DELAYED_RELEASE_TABLET | Freq: Every day | ORAL | Status: DC

## 2021-04-17 MED ORDER — PROCHLORPERAZINE MALEATE 5 MG PO TABS
5.0000 mg | ORAL_TABLET | Freq: Four times a day (QID) | ORAL | Status: DC | PRN
  Administered 2021-04-19: 5 mg via ORAL

## 2021-04-17 MED ORDER — CHLORHEXIDINE GLUCONATE CLOTH 2 % EX PADS
6.0000 | MEDICATED_PAD | Freq: Every day | CUTANEOUS | Status: DC
  Administered 2021-04-18 – 2021-04-24 (×7): 6 via TOPICAL

## 2021-04-17 MED ORDER — OXYCODONE HCL 5 MG PO TABS: 5.0000 mg | ORAL_TABLET | ORAL | 0 refills | Status: DC | PRN

## 2021-04-17 MED ORDER — IRBESARTAN 75 MG PO TABS
37.5000 mg | ORAL_TABLET | Freq: Every day | ORAL | Status: DC
  Administered 2021-04-18 – 2021-04-26 (×9): 37.5 mg via ORAL
  Filled 2021-04-17 (×9): qty 1

## 2021-04-17 MED ORDER — PROCHLORPERAZINE 25 MG RE SUPP: 12.5000 mg | Freq: Four times a day (QID) | RECTAL | Status: DC | PRN

## 2021-04-17 MED ORDER — ASCORBIC ACID 500 MG PO TABS
500.0000 mg | ORAL_TABLET | Freq: Two times a day (BID) | ORAL | Status: DC
  Administered 2021-04-17 – 2021-04-23 (×12): 500 mg via ORAL
  Filled 2021-04-17 (×12): qty 1

## 2021-04-17 MED ORDER — ACETAMINOPHEN 325 MG PO TABS: 325.0000 mg | ORAL_TABLET | ORAL | Status: DC | PRN

## 2021-04-17 MED ORDER — ONDANSETRON HCL 4 MG/2ML IJ SOLN
4.0000 mg | Freq: Four times a day (QID) | INTRAMUSCULAR | Status: DC | PRN
  Administered 2021-04-19 – 2021-04-24 (×3): 4 mg via INTRAVENOUS
  Filled 2021-04-17 (×3): qty 2

## 2021-04-17 MED ORDER — BISACODYL 10 MG RE SUPP: 10.0000 mg | Freq: Every day | RECTAL | Status: DC | PRN

## 2021-04-17 MED ORDER — OXYCODONE HCL 5 MG PO TABS
5.0000 mg | ORAL_TABLET | ORAL | Status: DC | PRN
  Administered 2021-04-18 – 2021-04-24 (×9): 10 mg via ORAL
  Filled 2021-04-17 (×9): qty 2

## 2021-04-17 MED ORDER — FUROSEMIDE 80 MG PO TABS
80.0000 mg | ORAL_TABLET | Freq: Two times a day (BID) | ORAL | Status: DC
Start: 2021-04-17 — End: 2021-04-26

## 2021-04-17 MED ORDER — ALBUTEROL SULFATE (2.5 MG/3ML) 0.083% IN NEBU: 2.5000 mg | INHALATION_SOLUTION | Freq: Four times a day (QID) | RESPIRATORY_TRACT | Status: DC | PRN

## 2021-04-17 MED ORDER — GUAIFENESIN-DM 100-10 MG/5ML PO SYRP
5.0000 mL | ORAL_SOLUTION | Freq: Four times a day (QID) | ORAL | Status: DC | PRN
  Filled 2021-04-17: qty 10

## 2021-04-17 MED ORDER — INSULIN ASPART 100 UNIT/ML IJ SOLN
0.0000 [IU] | Freq: Three times a day (TID) | INTRAMUSCULAR | Status: DC
Start: 2021-04-18 — End: 2021-04-23
  Administered 2021-04-18: 1 [IU] via SUBCUTANEOUS
  Administered 2021-04-18: 2 [IU] via SUBCUTANEOUS
  Administered 2021-04-19 – 2021-04-21 (×5): 1 [IU] via SUBCUTANEOUS
  Administered 2021-04-21: 2 [IU] via SUBCUTANEOUS
  Administered 2021-04-23: 1 [IU] via SUBCUTANEOUS

## 2021-04-17 MED ORDER — ENSURE ENLIVE PO LIQD
237.0000 mL | Freq: Two times a day (BID) | ORAL | Status: DC
  Administered 2021-04-18 – 2021-04-25 (×9): 237 mL via ORAL

## 2021-04-17 MED ORDER — ROSUVASTATIN CALCIUM 20 MG PO TABS
40.0000 mg | ORAL_TABLET | Freq: Every day | ORAL | Status: DC
  Administered 2021-04-18 – 2021-04-26 (×9): 40 mg via ORAL
  Filled 2021-04-17 (×9): qty 2

## 2021-04-17 MED ORDER — POLYETHYLENE GLYCOL 3350 17 G PO PACK: 17.0000 g | PACK | Freq: Every day | ORAL | Status: DC | PRN

## 2021-04-17 MED ORDER — GABAPENTIN 300 MG PO CAPS
300.0000 mg | ORAL_CAPSULE | Freq: Every day | ORAL | Status: DC | PRN
  Administered 2021-04-24: 300 mg via ORAL
  Filled 2021-04-17: qty 1

## 2021-04-17 MED ORDER — FLEET ENEMA 7-19 GM/118ML RE ENEM
1.0000 | ENEMA | Freq: Once | RECTAL | Status: DC | PRN
Start: 2021-04-17 — End: 2021-04-26

## 2021-04-17 MED ORDER — ALBUTEROL SULFATE HFA 108 (90 BASE) MCG/ACT IN AERS: 1.0000 | INHALATION_SPRAY | Freq: Four times a day (QID) | RESPIRATORY_TRACT | Status: DC | PRN

## 2021-04-17 MED ORDER — PHENOL 1.4 % MT LIQD: 1.0000 | OROMUCOSAL | Status: DC | PRN

## 2021-04-17 NOTE — Discharge Summary (Signed)
Physician Discharge Summary  Sherri Dixon [id number]:[id number] DOB: Jun 10, 1981 DOA: 04/05/2021  PCP: Tomasa Hose, NP  Admit date: 04/05/2021 Discharge date: 04/17/2021  Time spent: 27  minutes  Recommendations for Outpatient Follow-up:  Nonweightbearing right foot at least for 6 weeks until sees Dr. 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  After a period of 1 week wherein she developed swelling pain and purulence coming out of the area necrotizing infection Rx Dilaudid cefepime vancomycin fluid boluses  Hospital Course:  Sepsis on admission from necrotizing right foot infection and osteo- Underwent I&D 7/9 and blood cultures were negative-wound culture grew Streptococcus Gallolyticus Proteus and she underwent transmetatarsal amputation 04/12/2021 PICC line was inserted 717 and will need Rocephin Flagyl through 05/24/2021 per infectious disease specialist  Acute kidney injury Patient had this on admission and this resolved-multifactorial secondary to sepsis ARB and Lasix See below discussion  Microcytic anemia in the setting of perimenopausal female on Xarelto Transfused 4 units PRBC Hemoglobin ranging  in the 9 range  History of PE Xarelto initially held resumed on discharge  HFpEF Lasix 80 twice daily on discharge Farxiga and ARB in addition Will need labs in the next several days to determine if ARB needs to be adjusted  DM TY 2 with nephropathy neuropathy Moderately controlled during hospital stay  History of PE and history of CVA Was on heparin during hospital stay and transition back to Xarelto  Antibiotics: Rocephin and Flagyl started on 04/13/2021   Microbiology data: Blood culture: Negative till date   Procedures: None Transmetatarsal amputation 715  Discharge Exam: Vitals:   04/17/21 0734 04/17/21 1239  BP: (!) 158/93 (!) 174/86  Pulse: 75 70  Resp: 16 17  Temp: 97.8 F (36.6 C) 97.8 F (36.6 C)  SpO2: 100% 100%    Subj on day of d/c   Awake coherent no distress eating drinking pain is moderately controlled not using IV meds  General Exam on discharge  EOMI NCAT mild pallor no icterus neck soft supple thick S1-S2 no murmur no rub no gallop Chest clear no rales no rhonchi Abdomen soft no rebound Right lower extremity wrapped in dressing and not reviewed today Neurologically intact no focal deficit  Discharge Instructions   Discharge Instructions     Diet - low sodium heart healthy   Complete by: As directed    Increase activity slowly   Complete by: As directed    No wound care   Complete by: As directed       Allergies as of 04/17/2021       Reactions   Amlodipine Swelling   Humalog Kwikpen [insulin Lispro] Other (See Comments)   75/50---swelling and ulcers in mouth         Medication  List     STOP taking these medications    Basaglar KwikPen 100 UNIT/ML   Flintstones w/Iron 18 MG Chew   ibuprofen 200 MG tablet Commonly known as: ADVIL   nitroGLYCERIN 0.4 MG SL tablet Commonly known as: NITROSTAT       TAKE these medications    Alcohol Swabs 70 % Pads 1 application by Does not apply route 3 (three) times daily  before meals.   cetirizine 10 MG tablet Commonly known as: ZYRTEC Take 10 mg by mouth daily as needed for allergies.   Farxiga 10 MG Tabs tablet Generic drug: dapagliflozin propanediol Take 10 mg by mouth daily as needed.   ferrous sulfate 325 (65 FE) MG EC tablet Take 1 tablet (325 mg total) by mouth 2 (two) times daily.   furosemide 80 MG tablet Commonly known as: LASIX Take 1 tablet (80 mg total) by mouth 2 (two) times daily. What changed:  medication strength how much to take how to take this when to take this additional instructions   gabapentin 300 MG capsule Commonly known as: NEURONTIN Take 300 mg by mouth daily as needed (pain).   glucose blood test strip Use as instructed   hydrALAZINE 50 MG tablet Commonly known as: APRESOLINE Take 1 tablet (50 mg total) by mouth 3 (three) times daily.   INSULIN SYRINGE 1CC/28G 28G X 1/2" 1 ML Misc 1 application by Does not apply route 3 (three) times daily before meals.   metoprolol succinate 100 MG 24 hr tablet Commonly known as: TOPROL-XL Take 1 tablet (100 mg total) by mouth daily.   multivitamin with minerals Tabs tablet Take 1 tablet by mouth daily. Centrum   NovoLOG FlexPen 100 UNIT/ML FlexPen Generic drug: insulin aspart Inject 12-20 Units as directed 3 (three) times daily. Sliding scale   olmesartan 40 MG tablet Commonly known as: BENICAR Take 1 tablet (40 mg total) by mouth daily.   oxyCODONE 5 MG immediate release tablet Commonly known as: Oxy IR/ROXICODONE Take 1-2 tablets (5-10 mg total) by mouth every 4 (four) hours as needed for moderate pain or severe pain.   potassium chloride SA 20 MEQ tablet Commonly known as: KLOR-CON Take 20 mEq by mouth daily.   rosuvastatin 40 MG tablet Commonly known as: CRESTOR Take 1 tablet (40 mg total) by mouth daily.   Vitamin D (Ergocalciferol) 1.25 MG (50000 UNIT) Caps capsule Commonly known as: DRISDOL Take 50,000 Units by mouth once a week.   Xarelto 20 MG  Tabs tablet Generic drug: rivaroxaban Take 20 mg by mouth daily.       Allergies  Allergen Reactions   Amlodipine Swelling   Humalog Kwikpen [Insulin Lispro] Other (See Comments)    75/50---swelling and ulcers in mouth       The results of significant diagnostics from this hospitalization (including imaging, microbiology, ancillary and laboratory) are listed below for reference.    Significant Diagnostic Studies: US RENAL  Result Date: 04/07/2021 CLINICAL DATA:  AK I Urinary retention EXAM: RENAL / URINARY TRACT ULTRASOUND COMPLETE COMPARISON:  01/27/2021 FINDINGS: Right Kidney: Renal measurements: 12.8 x 5.1 x 6.7 cm = volume: 229 mL. Echogenicity within normal limits. No mass or hydronephrosis visualized. 3.0 cm simple cyst seen in the upper pole. Left Kidney: Renal measurements: 11.1 x 5.6 x 6.5 cm = volume: 212 mL. Echogenicity within normal limits. No mass or hydronephrosis visualized. Bladder: Appears normal for degree of bladder distention. Other: None. IMPRESSION: No significant sonographic abnormality of the kidneys. Electronically Signed  By: Sharen Heck  Mir M.D.   On: 04/07/2021 09:16   DG Foot Complete Right  Result Date: 04/05/2021 CLINICAL DATA:  Infection EXAM: RIGHT FOOT COMPLETE - 3+ VIEW COMPARISON:  None. FINDINGS: Vascular calcifications. No fracture or malalignment. No radiopaque foreign body. Diffuse soft tissue swelling. Fairly extensive gas within the soft tissues over the dorsum and plantar aspect of the foot, centered over the second third and fourth distal digits. IMPRESSION: Edema with fairly extensive soft tissue emphysema concerning for necrotizing infection. No acute osseous abnormality Electronically Signed   By: Donavan Foil M.D.   On: 04/05/2021 18:01   VAS [place] ABI WITH/WO TBI  Result Date: 04/10/2021  LOWER EXTREMITY DOPPLER STUDY Patient Name:  Sherri Dixon  Date of Exam:   04/09/2021 Medical Rec #: [id number]      Accession #:    [id number] Date of Birth:  08-Jun-1981       Patient Gender: F Patient Age:   65Y Exam Location:  [facility] Procedure:      VAS [place] ABI WITH/WO TBI Referring Phys: Catherine --------------------------------------------------------------------------------  Indications: Post op. High Risk Factors: Diabetes, prior CVA.  Performing Technologist: Archie Patten RVS  Examination Guidelines: A complete evaluation includes at minimum, Doppler waveform signals and systolic blood pressure reading at the level of bilateral brachial, anterior tibial, and posterior tibial arteries, when vessel segments are accessible. Bilateral testing is considered an integral part of a complete examination. Photoelectric Plethysmograph (PPG) waveforms and toe systolic pressure readings are included as required and additional duplex testing as needed. Limited examinations for reoccurring indications may be performed as noted.  ABI Findings: +--------+------------------+-----+---------+--------+ Right   Rt Pressure (mmHg)IndexWaveform Comment  +--------+------------------+-----+---------+--------+ [id number]                    triphasic         +--------+------------------+-----+---------+--------+ PTA     188               0.91 biphasic          +--------+------------------+-----+---------+--------+ DP      225               1.09 biphasic          +--------+------------------+-----+---------+--------+ +--------+------------------+-----+---------+-------+ Left    Lt Pressure (mmHg)IndexWaveform Comment +--------+------------------+-----+---------+-------+ [id number]                    triphasic        +--------+------------------+-----+---------+-------+ PTA     255               1.24 triphasic        +--------+------------------+-----+---------+-------+ DP      233               1.13 triphasic        +--------+------------------+-----+---------+-------+  +-------+-----------+-----------+------------+------------+ ABI/TBIToday's ABIToday's TBIPrevious ABIPrevious TBI +-------+-----------+-----------+------------+------------+ Right  1.09                                           +-------+-----------+-----------+------------+------------+ Left   1.24                                           +-------+-----------+-----------+------------+------------+  Summary: Right: Resting right ankle-brachial index is within normal range.  No evidence of significant right lower extremity arterial disease. Left: Resting left ankle-brachial index is within normal range. No evidence of significant left lower extremity arterial disease. ABIs are unreliable.  *See table(s) above for measurements and observations.  Electronically signed by Jamelle Haring on 04/10/2021 at 7:17:48 PM.    Final    [place] EKG SITE RITE  Result Date: 04/13/2021 If Site Rite image not attached, placement could not be confirmed due to current cardiac rhythm.   Microbiology: No results found for this or any previous visit (from the past 240 hour(s)).   Labs: Basic Metabolic Panel: Recent Labs  Lab 04/11/21 0022 04/12/21 0217 04/13/21 0544 04/14/21 0917  NA 134* 136 135 136  K 3.8 3.8 4.2 3.7  CL 105 106 107 104  CO2 22 25 25 26   GLUCOSE 92 140* 196* 110*  BUN 19 17 19 20   CREATININE 1.43* 1.34* 1.50* 1.43*  CALCIUM 7.1* 7.6* 7.6* 8.0*   Liver Function Tests: No results for input(s): AST, ALT, ALKPHOS, BILITOT, PROT, ALBUMIN in the last 168 hours. No results for input(s): LIPASE, AMYLASE in the last 168 hours. No results for input(s): AMMONIA in the last 168 hours. CBC: Recent Labs  Lab 04/12/21 0217 04/13/21 0544 04/13/21 2115 04/14/21 1515 04/15/21 0320 04/16/21 0413 04/17/21 0441  WBC 18.9* 24.7*  --   --  20.4* 19.1* 15.4*  HGB 7.4* 6.8* 6.6* 9.0* 8.6* 8.1* 8.5*  HCT 22.7* 22.1* 21.4* 27.6* 26.5* 25.6* 26.5*  MCV 84.4 87.7  --   --  86.6 88.9 88.6  PLT  274 320  --   --  393 464* 519*   Cardiac Enzymes: No results for input(s): CKTOTAL, CKMB, CKMBINDEX, TROPONINI in the last 168 hours. BNP: BNP (last 3 results) Recent Labs    12/12/20 1823 01/28/21 0333  BNP 294.8* 365.6*    ProBNP (last 3 results) No results for input(s): PROBNP in the last 8760 hours.  CBG: Recent Labs  Lab 04/16/21 1224 04/16/21 1623 04/16/21 2213 04/17/21 0730 04/17/21 1236  GLUCAP 98 115* 137* 110* 96       Signed:  Nita Sells MD   [facility] 04/17/2021, 2:14 PM

## 2021-04-17 NOTE — Progress Notes (Signed)
     Sherri Dixon is a 40 y.o. female   Orthopaedic diagnosis: status post right transmetatarsal amputation and VAC placement  Subjective: Patient is resting comfortably.  She denies pain in her right foot.  She has been working with therapy and maintaining nonweightbearing.  Objectyive: Vitals:   04/17/21 0734 04/17/21 1239  BP: (!) 158/93 (!) 174/86  Pulse: 75 70  Resp: 16 17  Temp: 97.8 F (36.6 C) 97.8 F (36.6 C)  SpO2: 100% 100%     Exam: Awake and alert Respirations even and unlabored No acute distress  Right foot with wound VAC in place.  Wound VAC was removed.  There is no gross dehiscence.  Some bruising on the dorsal aspect of the foot at the incision site.  No gross purulence or drainage.  No foul odor.  Assessment: Status post right transmetatarsal amputation   Plan: The VAC was removed today.  She was placed in a soft dressing.  We will plan for daily dressing changes. She should remain nonweightbearing on RLE PICC line has been placed and she continues antibiotics. She is suitable for discharge from orthopedic standpoint.  She will follow up with me in 1 week for wound check.   Radene Journey, MD

## 2021-04-17 NOTE — PMR Pre-admission (Signed)
PMR Admission Coordinator Pre-Admission Assessment  Patient: Sherri Dixon is an 40 y.o., female MRN: [id number] DOB: [birth year] Height: 5' 2"  (157.5 cm) Weight: 134.8 kg  Insurance Information HMO:     PPO:      PCP:      IPA:      80/20:      OTHER:  PRIMARY: [employer]      Policy#: [id number]      Subscriber: [name].      Phone#: [phone]     Fax#: [phone] Pre-Cert#: [id number] approved for 7 days      Employer:  Benefits:  Phone #: [phone]     Name: 7/19 Eff. Date: 04/29/2018  told we are out of network and are processing under out of network  benefits     Deduct: $1200      Out of Pocket Max: $5500      Life Max: none CIR: 60%      based on out of network   In network SNF: 80% 60 days Outpatient:  80%    Co-Pay: 20% Home Health: 80%      Co-Pay: 20% DME: 80%     Co-Pay: 20% Providers: CIR listed as out of network; other benefits are quoted as in network  SECONDARY: none        Development worker, community:       Phone#:   The Therapist, art Information Summary" for patients in Inpatient Rehabilitation Facilities with attached "Privacy Act [place] Records" was provided and verbally reviewed with: Patient  Emergency Contact Information Contact Information     Name Relation Home Work Mobile   [place] [phone]  [phone]   Coralyn Pear [phone]  [phone]       Current Medical History  Patient Admitting Diagnosis: transmetatarsal amputation  History of Present Illness: 40 year old female with history of HTN, T2DM, PE on Xarelto, super morbid obesity  and anemia. Presented on 04/05/2021 with right foot pain with swelling and drainage sustained during an injury one week prior to admit.  She was found to have a necrotic infected wound and orthopedics was consulted. She was taken to the OR with irrigation and debridement with wound VAC placement on 7/9.Wound cultures form OR grew streptococcus gallolyticus and  proteus. Additional surgery on 7/15 for transmetatarsal amputation. WBAT. PICC line placed on 7/15 and she will need 6 weeks of antibiotics with tentative end date of 05/24/21. Now on Rocephin and Flagyl.Xarelto resumed for history of PE. Hb A1c of 8.9 with blood glucase maintained with SSI.   Patient's medical record from [facility]  has been reviewed by the rehabilitation admission coordinator and physician.  Past Medical History  Past Medical History:  Diagnosis Date   Anemia    Asthma    CHF (congestive heart failure) (HCC)    Pt reports that father has CHF, not her   Diabetes mellitus without complication ([place])    Dyslipidemia    GERD (gastroesophageal reflux disease)    Pulmonary embolism (HCC)    Stroke ([place])     Family History   family history includes Arthritis in her mother; Diabetes in her brother and mother; High blood pressure in her brother, father, mother, and sister; Hyperlipidemia in her brother, father, and mother.  Prior Rehab/Hospitalizations Has the patient had prior rehab or hospitalizations prior to admission? Yes  Has the patient had major surgery during 100 days prior to admission? Yes   Current Medications  Current  Facility-Administered Medications:    0.9 %  sodium chloride infusion (Manually program via Guardrails IV Fluids), , Intravenous, Once, [place], Bing Neighbors, NP   acetaminophen (TYLENOL) tablet 650 mg, 650 mg, Oral, Q6H PRN, 650 mg at 04/09/21 0506 **OR** acetaminophen (TYLENOL) suppository 650 mg, 650 mg, Rectal, Q6H PRN, Erle Crocker, MD   albuterol (VENTOLIN HFA) 108 (90 Base) MCG/ACT inhaler 1-2 puff, 1-2 puff, Inhalation, Q6H PRN, Erle Crocker, MD   alum & mag hydroxide-simeth (MAALOX/MYLANTA) 200-200-20 MG/5ML suspension 15-30 mL, 15-30 mL, Oral, Q2H PRN, Erle Crocker, MD   cefTRIAXone (ROCEPHIN) 2 g in sodium chloride 0.9 % 100 mL IVPB, 2 g, Intravenous, Q24H, Millen, Jessica B, RPH, Last Rate: 200 mL/hr  at 04/16/21 2037, 2 g at 04/16/21 2037   Chlorhexidine Gluconate Cloth 2 % PADS 6 each, 6 each, Topical, Daily, Charlynne Cousins, MD, 6 each at 04/17/21 1051   docusate sodium (COLACE) capsule 100 mg, 100 mg, Oral, Daily, Erle Crocker, MD, 100 mg at 04/17/21 1006   furosemide (LASIX) tablet 80 mg, 80 mg, Oral, BID, Erle Crocker, MD, 80 mg at 04/17/21 1006   gabapentin (NEURONTIN) capsule 300 mg, 300 mg, Oral, Daily PRN, Erle Crocker, MD, 300 mg at 04/14/21 2114   guaiFENesin-dextromethorphan (ROBITUSSIN DM) 100-10 MG/5ML syrup 15 mL, 15 mL, Oral, Q4H PRN, Erle Crocker, MD   hydrALAZINE (APRESOLINE) injection 5 mg, 5 mg, Intravenous, Q20 Min PRN, Erle Crocker, MD   hydrALAZINE (APRESOLINE) tablet 50 mg, 50 mg, Oral, TID, Erle Crocker, MD, 50 mg at 04/17/21 1006   insulin aspart (novoLOG) injection 0-5 Units, 0-5 Units, Subcutaneous, QHS, Charlynne Cousins, MD   insulin aspart (novoLOG) injection 0-9 Units, 0-9 Units, Subcutaneous, TID WC, Charlynne Cousins, MD, 1 Units at 04/13/21 1744   insulin aspart (novoLOG) injection 3 Units, 3 Units, Subcutaneous, TID WC, Charlynne Cousins, MD, 3 Units at 04/17/21 1317   irbesartan (AVAPRO) tablet 37.5 mg, 37.5 mg, Oral, Daily, Charlynne Cousins, MD, 37.5 mg at 04/17/21 1005   labetalol (NORMODYNE) injection 10 mg, 10 mg, Intravenous, Q10 min PRN, Erle Crocker, MD   magnesium sulfate IVPB 2 g 50 mL, 2 g, Intravenous, Daily PRN, Erle Crocker, MD   metoprolol succinate (TOPROL-XL) 24 hr tablet 100 mg, 100 mg, Oral, Daily, Erle Crocker, MD, 100 mg at 04/17/21 1006   metoprolol tartrate (LOPRESSOR) injection 2-5 mg, 2-5 mg, Intravenous, Q2H PRN, Erle Crocker, MD   metroNIDAZOLE (FLAGYL) tablet 500 mg, 500 mg, Oral, Q8H, Millen, Jessica B, RPH, 500 mg at 04/17/21 1319   morphine 2 MG/ML injection 2 mg, 2 mg, Intravenous, Q4H PRN, Erle Crocker, MD, 2 mg at 04/14/21  2114   ondansetron (ZOFRAN) injection 4 mg, 4 mg, Intravenous, Q6H PRN, Erle Crocker, MD, 4 mg at 04/17/21 1018   oxyCODONE (Oxy IR/ROXICODONE) immediate release tablet 5-10 mg, 5-10 mg, Oral, Q4H PRN, Erle Crocker, MD, 10 mg at 04/17/21 0507   pantoprazole (PROTONIX) EC tablet 40 mg, 40 mg, Oral, q1800, Erle Crocker, MD, 40 mg at 04/16/21 1729   phenol (CHLORASEPTIC) mouth spray 1 spray, 1 spray, Mouth/Throat, PRN, Erle Crocker, MD   potassium chloride SA (KLOR-CON) CR tablet 20-40 mEq, 20-40 mEq, Oral, Daily PRN, Erle Crocker, MD   rivaroxaban Alveda Reasons) tablet 20 mg, 20 mg, Oral, Daily, Charlynne Cousins, MD, 20 mg at 04/17/21 1006   rosuvastatin (CRESTOR) tablet 40 mg,  40 mg, Oral, Daily, Erle Crocker, MD, 40 mg at 04/17/21 1006   sodium chloride flush (NS) 0.9 % injection 10-40 mL, 10-40 mL, Intracatheter, Q12H, Charlynne Cousins, MD, 10 mL at 04/17/21 1052   sodium chloride flush (NS) 0.9 % injection 10-40 mL, 10-40 mL, Intracatheter, PRN, Charlynne Cousins, MD  Patients Current Diet:  Diet Order             Diet - low sodium heart healthy           Diet heart healthy/carb modified Room service appropriate? 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 No  Prior Activity Level Limited Community (1-2x/wk): Independent, sedentary; drove  Prior Functional Level Self Care: Did the patient need help bathing, dressing, using the toilet or eating? Needed some help  Indoor Mobility: Did the patient need assistance with walking from room to room (with or without device)?  Independent  Stairs: Did the patient need assistance with internal or external stairs (with or without device)? Independent  Functional Cognition: Did the patient need help planning regular tasks such as shopping or remembering to take medications? Independent  Home Assistive Devices / Equipment Home Equipment: None  Prior Device Use: Indicate devices/aids used by the patient prior to current illness, exacerbation or injury? None of the above  Current Functional Level Cognition  Overall Cognitive Status: Within Functional Limits for tasks assessed Orientation Level: Oriented X4    Extremity Assessment (includes Sensation/Coordination)  Upper Extremity Assessment: Generalized weakness LUE Deficits / Details: impaird weakness 3/5 grossly (recent CVA)  Lower Extremity Assessment: LLE deficits/detail LLE Deficits / Details: 3- hip and 3/5 knee and ankle LLE:  (recent CVA affecting L side) LLE Coordination: decreased gross motor    ADLs  Overall ADL's : Needs assistance/impaired Eating/Feeding: Set up, Independent, Sitting Grooming: Wash/dry hands, Wash/dry face, Sitting, Supervision/safety, Set up Upper Body Bathing: Set up, Supervision/ safety, Sitting Upper Body Bathing Details (indicate cue type and reason): simulated Lower Body Bathing: Maximal assistance, Moderate assistance, Sitting/lateral leans Lower Body Bathing Details (indicate cue type and reason): simulated seated in recliner Upper Body Dressing : Set up, Supervision/safety, Sitting Lower Body Dressing: Total assistance Toilet Transfer: Minimal assistance, +2 for physical assistance, Cueing for sequencing, Cueing for safety Toilet Transfer Details (indicate cue type and reason): simulated with lateral scoot to drop arm recliner Toileting- Clothing Manipulation and Hygiene: Maximal assistance, Sitting/lateral lean Toileting - Clothing Manipulation Details (indicate cue type and reason): clothing mgt mod A leaning side to  side General ADL Comments: pt seated in recliner for groomimh/hygiene, LB bathing tasks leaning side to side    Mobility  Overal bed mobility: Needs Assistance Bed Mobility: Supine to Sit, Sit to Supine Rolling: Mod assist Supine to sit: Min assist Sit to supine: Min assist General bed mobility comments: Min assistance to sit edge of bed to prepare for AP transfers.  Pt required min assistance to position and return back to bed.    Transfers  Overall transfer level: Needs assistance Equipment used: None Transfers: Anterior-Posterior Transfer Sit to Stand: Total assist, +2 physical assistance (+3 for  safety) Anterior-Posterior transfers: Mod assist, +2 physical assistance  Lateral/Scoot Transfers: +2 physical assistance, +2 safety/equipment, Min assist, Mod assist General transfer comment: Pt performed posterior scoot from bed to [facility] with use of bed pad and mod assistance on each side to scoot into WC, Once in WC lowered legs to continue to scoot into WC comfortably.  Pt performed WC>bed with assistance to place B feet in bed. and mod +2 to scoot forward back to bed.    Ambulation / Gait / Stairs / Wheelchair Mobility  Ambulation/Gait Ambulation/Gait assistance:  (remains unable to maintain weight bearing in standing without significant assistance.) General Gait Details: unable Wheelchair Mobility Wheelchair mobility: Yes Wheelchair propulsion: Both upper extremities Wheelchair parts: Needs assistance (Pt able to assist with locking and unlocking brakes but required total assistance with foot plates.  Pt able to use drive wheels for forward propulsion) Distance: 68f + 60 ft + 45 ft Wheelchair Assistance Details (indicate cue type and reason): Min guard to min assistance during forward propulsion to maintain straight path and for turns and backing.  Pt required decreased assistance with forward propulsion as trial progressed.    Posture / Balance Dynamic Sitting Balance Sitting balance  - Comments: Pt able to use UEs on armrests to scoot/push self to back of chair Balance Overall balance assessment: Needs assistance Sitting-balance support: Feet supported Sitting balance-Leahy Scale: Good Sitting balance - Comments: Pt able to use UEs on armrests to scoot/push self to back of chair Standing balance-Leahy Scale: Poor    Special needs/care consideration PICC placed for home IV antibiotics for 6 weeks  Hgb a1c 8.1   Previous Home Environment  Living Arrangements: Spouse/significant other  Lives With: Spouse Available Help at Discharge: Family, Available 24 hours/day (spouse to take FMLA) Type of Home: House Home Layout: One level Home Access: Stairs to enter Entrance Stairs-Rails: Right, Left, Can reach both Entrance Stairs-Number of Steps: 3+1 Bathroom Shower/Tub: TChiropodist Standard Bathroom Accessibility: Yes How Accessible: Accessible via walker Home Care Services: No  Discharge Living Setting Plans for Discharge Living Setting: Patient's home, Lives with (comment) (spouse) Type of Home at Discharge: House Discharge Home Layout: One level Discharge Home Access: Stairs to enter Entrance Stairs-Rails: Right, Left, Can reach both Entrance Stairs-Number of Steps: 3 plus 1 Discharge Bathroom Shower/Tub: Tub/shower unit Discharge Bathroom Toilet: Standard Discharge Bathroom Accessibility: Yes How Accessible: Accessible via walker Does the patient have any problems obtaining your medications?: No  Social/Family/Support Systems Contact Information: spouse Anticipated Caregiver: spouse to take fmla Anticipated Caregiver's Contact Information: see above Ability/Limitations of Caregiver: no limitations; aware she will need home IV antibiotics Caregiver Availability: 24/7 Discharge Plan Discussed with Primary Caregiver: Yes Is Caregiver In Agreement with Plan?: Yes Does Caregiver/Family have Issues with Lodging/Transportation while Pt is in  Rehab?: No  Goals Patient/Family Goal for Rehab: supervision to min assist with PT and OT Expected length of stay: ELOS 2 weeks Pt/Family Agrees to Admission and willing to participate: Yes Program Orientation Provided & Reviewed with Pt/Caregiver Including Roles  & Responsibilities: Yes Decrease burden of Care through IP rehab admission: n/a  Possible need for SNF placement upon discharge: not anticipated; spouse to take FMLA  Patient Condition: [facility], spoken with CM, and patient and spouse. I met with patient at the bedside for inpatient rehabilitation assessment.  Patient will benefit from ongoing PT and OT, can actively participate in 3 hours of therapy a day 5 days of  the week, and can make measurable gains during the admission.  Patient will also benefit from the coordinated team approach during an Inpatient Acute Rehabilitation admission.  The patient will receive intensive therapy as well as Rehabilitation physician, nursing, social worker, and care management interventions.  Due to bladder management, bowel management, safety, skin/wound care, disease management, medication administration, pain management, and patient education the patient requires 24 hour a day rehabilitation nursing.  The patient is currently mod assist overall with mobility and basic ADLs.  Discharge setting and therapy post discharge at home with home health is anticipated.  Patient has agreed to participate in the Acute Inpatient Rehabilitation Program and will admit today.  Preadmission Screen Completed By:  Cleatrice Burke, 04/17/2021 2:16 PM ______________________________________________________________________   Discussed status with Dr. Letta Pate on  04/17/2021 at 1430 and received approval for admission today.  Admission Coordinator:  Cleatrice Burke, RN, time  1430 Date  04/17/2021   Assessment/Plan: Diagnosis:Diabetic foot infection s/p  transmet amputation 04/12/2021  Does the need for close, 24 hr/day Medical supervision in concert with the patient's rehab needs make it unreasonable for this patient to be served in a less intensive setting? Yes Co-Morbidities requiring supervision/potential complications: Super morbid obesity, T2 DM with peripheral neuropathy , hx of PE on xarelto, Hx Right BG infarct minimal Left hemiparesis Due to bladder management, bowel management, safety, skin/wound care, disease management, medication administration, pain management, and patient education, does the patient require 24 hr/day rehab nursing? Yes Does the patient require coordinated care of a physician, rehab nurse, PT, OT, and SLP to address physical and functional deficits in the context of the above medical diagnosis(es)? Yes Addressing deficits in the following areas: balance, endurance, locomotion, strength, transferring, bowel/bladder control, bathing, dressing, feeding, grooming, toileting, and psychosocial support Can the patient actively participate in an intensive therapy program of at least 3 hrs of therapy 5 days a week? Yes The potential for patient to make measurable gains while on inpatient rehab is good Anticipated functional outcomes upon discharge from inpatient rehab: min assist PT, min assist OT, n/a SLP Estimated rehab length of stay to reach the above functional goals is: 14-17d Anticipated discharge destination: Home 10. Overall Rehab/Functional Prognosis: good   MD Signature: Charlett Blake M.D. [facility] Fellow Am Acad of Phys Med and Rehab Diplomate Am Board of Electrodiagnostic Med Fellow Am Board of Interventional Pain

## 2021-04-17 NOTE — Progress Notes (Signed)
Sherri Blake, MD   Physician  Physical Medicine and Rehabilitation  PMR Pre-admission      Signed  Date of Service:  04/17/2021  2:16 PM       Related encounter: ED to Hosp-Admission (Current) from 04/05/2021 in [place]       Signed          Show:Clear all [x] Written[x] Templated[] Copied  Added by: [x] Sherri Gong, RN[x] Sherri Dixon, Sherri Salk, MD   [] Hover for details                                                                                                                                                                                                                                                                                                                                         PMR Admission Coordinator Pre-Admission Assessment   Patient: Sherri Dixon is an 40 y.o., female MRN: [id number] DOB: [birth year] Height: 5' 2"  (157.5 cm) Weight: 134.8 kg   Insurance Information HMO:     PPO:      PCP:      IPA:      80/20:      OTHER: PRIMARY: [employer]      Policy#: [id number]      Subscriber: pt CM Name: Sherri Dixon.      Phone#: [phone]     Fax#: [phone] Pre-Cert#: [id number] approved for 7 days      Employer: Benefits:  Phone #: [phone]     Name: 7/19 Eff. Date: 04/29/2018  told we are out of network and are processing under out of network  benefits     Deduct: $1200      Out of Pocket Max: $5500      Life Max: none CIR: 60%      based on out of network   In network SNF: 80% 60 days Outpatient:  80%  Co-Pay: 20% Home Health: 80%      Co-Pay: 20% DME: 80%     Co-Pay: 20% Providers: CIR listed as out of network; other benefits are quoted as in network  SECONDARY: none         Development worker, community:       Phone#:   The Therapist, art Information Summary" for patients in Inpatient Rehabilitation  Facilities with attached "Privacy Act [place] Records" was provided and verbally reviewed with: Patient   Emergency Contact Information Contact Information       Name Relation Home Work Mobile    [place] [phone]   [phone]    Coralyn Pear [phone]   [phone]           Current Medical History  Patient Admitting Diagnosis: transmetatarsal amputation   History of Present Illness: 40 year old female with history of HTN, T2DM, PE on Xarelto, super morbid obesity  and anemia. Presented on 04/05/2021 with right foot pain with swelling and drainage sustained during an injury one week prior to admit.   She was found to have a necrotic infected wound and orthopedics was consulted. She was taken to the OR with irrigation and debridement with wound VAC placement on 7/9.Wound cultures form OR grew streptococcus gallolyticus and proteus. Additional surgery on 7/15 for transmetatarsal amputation. WBAT. PICC line placed on 7/15 and she will need 6 weeks of antibiotics with tentative end date of 05/24/21. Now on Rocephin and Flagyl.Xarelto resumed for history of PE. Hb A1c of 8.9 with blood glucase maintained with SSI.    Patient's medical record from [facility]  has been reviewed by the rehabilitation admission coordinator and physician.   Past Medical History      Past Medical History:  Diagnosis Date   Anemia     Asthma     CHF (congestive heart failure) (HCC)      Pt reports that father has CHF, not her   Diabetes mellitus without complication ([place])     Dyslipidemia     GERD (gastroesophageal reflux disease)     Pulmonary embolism (HCC)     Stroke ([place])        Family History   family history includes Arthritis in her mother; Diabetes in her brother and mother; High blood pressure in her brother, father, mother, and sister; Hyperlipidemia in her brother, father, and mother.   Prior Rehab/Hospitalizations Has the patient had prior  rehab or hospitalizations prior to admission? Yes   Has the patient had major surgery during 100 days prior to admission? Yes              Current Medications   Current Facility-Administered Medications:   0.9 %  sodium chloride infusion (Manually program via Guardrails IV Fluids), , Intravenous, Once, Sherri Dixon, Sherri Neighbors, NP   acetaminophen (TYLENOL) tablet 650 mg, 650 mg, Oral, Q6H PRN, 650 mg at 04/09/21 0506 **OR** acetaminophen (TYLENOL) suppository 650 mg, 650 mg, Rectal, Q6H PRN, Sherri Crocker, MD   albuterol (VENTOLIN HFA) 108 (90 Base) MCG/ACT inhaler 1-2 puff, 1-2 puff, Inhalation, Q6H PRN, Sherri Crocker, MD   alum & mag hydroxide-simeth (MAALOX/MYLANTA) 200-200-20 MG/5ML suspension 15-30 mL, 15-30 mL, Oral, Q2H PRN, Sherri Crocker, MD   cefTRIAXone (ROCEPHIN) 2 g in sodium chloride 0.9 % 100 mL IVPB, 2 g, Intravenous, Q24H, Sherri Dixon, Sherri Dixon, Last Rate: 200 mL/hr at 04/16/21 2037, 2 g at 04/16/21 2037   Chlorhexidine Gluconate Cloth 2 % PADS 6 each,  6 each, Topical, Daily, Sherri Cousins, MD, 6 each at 04/17/21 1051   docusate sodium (COLACE) capsule 100 mg, 100 mg, Oral, Daily, Sherri Crocker, MD, 100 mg at 04/17/21 1006   furosemide (LASIX) tablet 80 mg, 80 mg, Oral, BID, Sherri Crocker, MD, 80 mg at 04/17/21 1006   gabapentin (NEURONTIN) capsule 300 mg, 300 mg, Oral, Daily PRN, Sherri Crocker, MD, 300 mg at 04/14/21 2114   guaiFENesin-dextromethorphan (ROBITUSSIN DM) 100-10 MG/5ML syrup 15 mL, 15 mL, Oral, Q4H PRN, Sherri Crocker, MD   hydrALAZINE (APRESOLINE) injection 5 mg, 5 mg, Intravenous, Q20 Min PRN, Sherri Crocker, MD   hydrALAZINE (APRESOLINE) tablet 50 mg, 50 mg, Oral, TID, Sherri Crocker, MD, 50 mg at 04/17/21 1006   insulin aspart (novoLOG) injection 0-5 Units, 0-5 Units, Subcutaneous, QHS, Sherri Cousins, MD   insulin aspart (novoLOG) injection 0-9 Units, 0-9 Units, Subcutaneous, TID WC, Sherri Cousins, MD, 1 Units at 04/13/21 1744   insulin aspart (novoLOG) injection 3 Units, 3 Units, Subcutaneous, TID WC, Sherri Cousins, MD, 3 Units at 04/17/21 1317   irbesartan (AVAPRO) tablet 37.5 mg, 37.5 mg, Oral, Daily, Sherri Cousins, MD, 37.5 mg at 04/17/21 1005   labetalol (NORMODYNE) injection 10 mg, 10 mg, Intravenous, Q10 min PRN, Sherri Crocker, MD   magnesium sulfate IVPB 2 g 50 mL, 2 g, Intravenous, Daily PRN, Sherri Crocker, MD   metoprolol succinate (TOPROL-XL) 24 hr tablet 100 mg, 100 mg, Oral, Daily, Sherri Crocker, MD, 100 mg at 04/17/21 1006   metoprolol tartrate (LOPRESSOR) injection 2-5 mg, 2-5 mg, Intravenous, Q2H PRN, Sherri Crocker, MD   metroNIDAZOLE (FLAGYL) tablet 500 mg, 500 mg, Oral, Q8H, Sherri Dixon, Sherri Dixon, 500 mg at 04/17/21 1319   morphine 2 MG/ML injection 2 mg, 2 mg, Intravenous, Q4H PRN, Sherri Crocker, MD, 2 mg at 04/14/21 2114   ondansetron (ZOFRAN) injection 4 mg, 4 mg, Intravenous, Q6H PRN, Sherri Crocker, MD, 4 mg at 04/17/21 1018   oxyCODONE (Oxy IR/ROXICODONE) immediate release tablet 5-10 mg, 5-10 mg, Oral, Q4H PRN, Sherri Crocker, MD, 10 mg at 04/17/21 0507   pantoprazole (PROTONIX) EC tablet 40 mg, 40 mg, Oral, q1800, Sherri Crocker, MD, 40 mg at 04/16/21 1729   phenol (CHLORASEPTIC) mouth spray 1 spray, 1 spray, Mouth/Throat, PRN, Sherri Crocker, MD   potassium chloride SA (KLOR-CON) CR tablet 20-40 mEq, 20-40 mEq, Oral, Daily PRN, Sherri Crocker, MD   rivaroxaban Alveda Reasons) tablet 20 mg, 20 mg, Oral, Daily, Sherri Cousins, MD, 20 mg at 04/17/21 1006   rosuvastatin (CRESTOR) tablet 40 mg, 40 mg, Oral, Daily, Sherri Crocker, MD, 40 mg at 04/17/21 1006   sodium chloride flush (NS) 0.9 % injection 10-40 mL, 10-40 mL, Intracatheter, Q12H, Sherri Cousins, MD, 10 mL at 04/17/21 1052   sodium chloride flush (NS) 0.9 % injection 10-40 mL, 10-40 mL, Intracatheter, PRN,  Sherri Cousins, MD   Patients Current Diet:  Diet Order                  Diet - low sodium heart healthy             Diet heart healthy/carb modified Room service appropriate? 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 No   Prior Activity Level Limited Community (1-2x/wk): Independent, sedentary; drove   Prior Functional Level Self Care: Did the patient need help bathing, dressing, using the toilet or eating? Needed some help   Indoor Mobility: Did the patient need assistance with walking from room to room (with or without device)? Independent   Stairs: Did the patient need assistance with internal or external stairs (with or without device)? Independent   Functional Cognition: Did the patient need help planning regular tasks such as shopping or remembering to take medications? Independent   Home Assistive Devices / Equipment Home Equipment: None   Prior Device Use: Indicate devices/aids used by the patient prior to current illness, exacerbation or injury? None of the above   Current Functional Level Cognition   Overall Cognitive Status: Within Functional Limits for tasks assessed Orientation Level: Oriented X4    Extremity Assessment (includes Sensation/Coordination)   Upper Extremity Assessment: Generalized weakness LUE Deficits / Details: impaird weakness 3/5 grossly (recent CVA)  Lower Extremity Assessment: LLE deficits/detail LLE Deficits / Details: 3- hip and 3/5 knee and ankle LLE:  (recent CVA affecting L side) LLE Coordination: decreased gross motor     ADLs   Overall ADL's : Needs  assistance/impaired Eating/Feeding: Set up, Independent, Sitting Grooming: Wash/dry hands, Wash/dry face, Sitting, Supervision/safety, Set up Upper Body Bathing: Set up, Supervision/ safety, Sitting Upper Body Bathing Details (indicate cue type and reason): simulated Lower Body Bathing: Maximal assistance, Moderate assistance, Sitting/lateral leans Lower Body Bathing Details (indicate cue type and reason): simulated seated in recliner Upper Body Dressing : Set up, Supervision/safety, Sitting Lower Body Dressing: Total assistance Toilet Transfer: Minimal assistance, +2 for physical assistance, Cueing for sequencing, Cueing for safety Toilet Transfer Details (indicate cue type and reason): simulated with lateral scoot to drop arm recliner Toileting- Clothing Manipulation and Hygiene: Maximal assistance, Sitting/lateral lean Toileting - Clothing Manipulation Details (indicate cue type and reason): clothing mgt mod A leaning side to side General ADL Comments: pt seated in recliner for groomimh/hygiene, LB bathing tasks leaning side to side     Mobility   Overal bed mobility: Needs Assistance Bed Mobility: Supine to Sit, Sit to Supine Rolling: Mod assist Supine to sit: Min assist Sit to supine: Min assist General bed mobility comments: Min assistance to sit edge of bed to prepare for AP transfers.  Pt required min assistance to position and return back to bed.     Transfers   Overall transfer level: Needs assistance Equipment used: None Transfers: Anterior-Posterior Transfer Sit to Stand: Total assist, +2 physical assistance (+3 for safety) Anterior-Posterior transfers: Mod assist, +2 physical assistance  Lateral/Scoot Transfers: +2 physical assistance, +2 safety/equipment, Min assist, Mod assist General transfer comment: Pt performed posterior scoot from bed to [facility] with use of bed pad and mod assistance on each side to scoot into WC, Once in WC lowered legs to continue to scoot into WC  comfortably.  Pt performed WC>bed with assistance to place Dixon feet in bed. and mod +2 to scoot forward back to bed.     Ambulation / Gait / Stairs / Wheelchair Mobility   Ambulation/Gait Ambulation/Gait assistance:  (remains unable to maintain weight bearing in standing without significant assistance.) General Gait Details: unable  Wheelchair Mobility Wheelchair mobility: Yes Wheelchair propulsion: Both upper extremities Wheelchair parts: Needs assistance (Pt able to assist with locking and unlocking brakes but required total assistance with foot plates.  Pt able to use drive wheels for forward propulsion) Distance: 33f + 60 ft + 45 ft Wheelchair Assistance Details (indicate cue type and reason): Min guard to min assistance during forward propulsion to maintain straight path and for turns and backing.  Pt required decreased assistance with forward propulsion as trial progressed.     Posture / Balance Dynamic Sitting Balance Sitting balance - Comments: Pt able to use UEs on armrests to scoot/push self to back of chair Balance Overall balance assessment: Needs assistance Sitting-balance support: Feet supported Sitting balance-Leahy Scale: Good Sitting balance - Comments: Pt able to use UEs on armrests to scoot/push self to back of chair Standing balance-Leahy Scale: Poor     Special needs/care consideration PICC placed for home IV antibiotics for 6 weeks Hgb a1c 8.1    Previous Home Environment  Living Arrangements: Spouse/significant other  Lives With: Spouse Available Help at Discharge: Family, Available 24 hours/day (spouse to take FMLA) Type of Home: House Home Layout: One level Home Access: Stairs to enter Entrance Stairs-Rails: Right, Left, Can reach both Entrance Stairs-Number of Steps: 3+1 Bathroom Shower/Tub: TChiropodist Standard Bathroom Accessibility: Yes How Accessible: Accessible via walker Home Care Services: No   Discharge Living Setting Plans  for Discharge Living Setting: Patient's home, Lives with (comment) (spouse) Type of Home at Discharge: House Discharge Home Layout: One level Discharge Home Access: Stairs to enter Entrance Stairs-Rails: Right, Left, Can reach both Entrance Stairs-Number of Steps: 3 plus 1 Discharge Bathroom Shower/Tub: Tub/shower unit Discharge Bathroom Toilet: Standard Discharge Bathroom Accessibility: Yes How Accessible: Accessible via walker Does the patient have any problems obtaining your medications?: No   Social/Family/Support Systems Contact Information: spouse Anticipated Caregiver: spouse to take fmla Anticipated Caregiver's Contact Information: see above Ability/Limitations of Caregiver: no limitations; aware she will need home IV antibiotics Caregiver Availability: 24/7 Discharge Plan Discussed with Primary Caregiver: Yes Is Caregiver In Agreement with Plan?: Yes Does Caregiver/Family have Issues with Lodging/Transportation while Pt is in Rehab?: No   Goals Patient/Family Goal for Rehab: supervision to min assist with PT and OT Expected length of stay: ELOS 2 weeks Pt/Family Agrees to Admission and willing to participate: Yes Program Orientation Provided & Reviewed with Pt/Caregiver Including Roles  & Responsibilities: Yes Decrease burden of Care through IP rehab admission: n/a   Possible need for SNF placement upon discharge: not anticipated; spouse to take FMLA   Patient Condition: I have reviewed medical records from [facility], spoken with CM, and patient and spouse. I met with patient at the bedside for inpatient rehabilitation assessment.  Patient will benefit from ongoing PT and OT, can actively participate in 3 hours of therapy a day 5 days of the week, and can make measurable gains during the admission.  Patient will also benefit from the coordinated team approach during an Inpatient Acute Rehabilitation admission.  The patient will receive intensive therapy as well as  Rehabilitation physician, nursing, social worker, and care management interventions.  Due to bladder management, bowel management, safety, skin/wound care, disease management, medication administration, pain management, and patient education the patient requires 24 hour a day rehabilitation nursing.  The patient is currently mod assist overall with mobility and basic ADLs.  Discharge setting and therapy post discharge at home with home health is anticipated.  Patient has  agreed to participate in the Acute Inpatient Rehabilitation Program and will admit today.   Preadmission Screen Completed By:  Cleatrice Burke, 04/17/2021 2:16 PM ______________________________________________________________________   Discussed status with Dr. Letta Pate on  04/17/2021 at 1430 and received approval for admission today.   Admission Coordinator:  Cleatrice Burke, RN, time  1430 Date  04/17/2021   Assessment/Plan: Diagnosis:Diabetic foot infection s/p transmet amputation 04/12/2021  Does the need for close, 24 hr/day Medical supervision in concert with the patient's rehab needs make it unreasonable for this patient to be served in a less intensive setting? Yes Co-Morbidities requiring supervision/potential complications: Super morbid obesity, T2 DM with peripheral neuropathy , hx of PE on xarelto, Hx Right BG infarct minimal Left hemiparesis Due to bladder management, bowel management, safety, skin/wound care, disease management, medication administration, pain management, and patient education, does the patient require 24 hr/day rehab nursing? Yes Does the patient require coordinated care of a physician, rehab nurse, PT, OT, and SLP to address physical and functional deficits in the context of the above medical diagnosis(es)? Yes Addressing deficits in the following areas: balance, endurance, locomotion, strength, transferring, bowel/bladder control, bathing, dressing, feeding, grooming, toileting, and  psychosocial support Can the patient actively participate in an intensive therapy program of at least 3 hrs of therapy 5 days a week? Yes The potential for patient to make measurable gains while on inpatient rehab is good Anticipated functional outcomes upon discharge from inpatient rehab: min assist PT, min assist OT, n/a SLP Estimated rehab length of stay to reach the above functional goals is: 14-17d Anticipated discharge destination: Home 10. Overall Rehab/Functional Prognosis: good     MD Signature: Sherri Dixon M.D. [facility] Fellow Am Acad of Phys Med and Rehab Diplomate Am Board of Electrodiagnostic Med Fellow Am Board of Interventional Pain            Revision History                        Note Details  Author Sherri Blake, MD File Time 04/17/2021  2:55 PM  Author Type Physician Status Signed  Last Editor Sherri Blake, MD Service Physical Medicine and Rehabilitation   Sherri Dixon, Sherri Salk, MD   Physician  Physical Medicine and Rehabilitation  PMR Pre-admission      Signed  Date of Service:  04/17/2021  2:16 PM       Related encounter: ED to Hosp-Admission (Current) from 04/05/2021 in [place]          Show:Clear all [x] Written[x] Templated[] Copied  Added by: [x] Sherri Gong, RN[x] Sherri Dixon, Sherri Salk, MD   [] Hover for details  PMR Admission Coordinator Pre-Admission Assessment   Patient: Sherri Dixon is an 40 y.o., female MRN:  [id number] DOB: 1981-04-28 Height: 5' 2"  (157.5 cm) Weight: 134.8 kg   Insurance Information HMO:     PPO:      PCP:      IPA:      80/20:      OTHER: PRIMARY: [employer]      Policy#: [id number]      Subscriber: pt CM Name: Sherri Dixon.      Phone#: [phone]     Fax#: [phone] Pre-Cert#: [id number] approved for 7 days      Employer: Benefits:  Phone #: [phone]     Name: 7/19 Eff. Date: 04/29/2018  told we are out of network and are processing under out of network  benefits     Deduct: $1200      Out of Pocket Max: $5500      Life Max: none CIR: 60%      based on out of network   In network SNF: 80% 60 days Outpatient:  80%    Co-Pay: 20% Home Health: 80%      Co-Pay: 20% DME: 80%     Co-Pay: 20% Providers: CIR listed as out of network; other benefits are quoted as in network  SECONDARY: none         Development worker, community:       Phone#:   The Therapist, art Information Summary" for patients in Inpatient Rehabilitation Facilities with attached "Privacy Act [place] Records" was provided and verbally reviewed with: Patient   Emergency Contact Information Contact Information       Name Relation Home Work Mobile    [place] [phone]   [phone]    Coralyn Pear [phone]   [phone]           Current Medical History  Patient Admitting Diagnosis: transmetatarsal amputation   History of Present Illness: 40 year old female with history of HTN, T2DM, PE on Xarelto, super morbid obesity  and anemia. Presented on 04/05/2021 with right foot pain with swelling and drainage sustained during an injury one week prior to admit.   She was found to have a necrotic infected wound and orthopedics was consulted. She was taken to the OR with irrigation and debridement with wound VAC placement on 7/9.Wound cultures form OR grew streptococcus gallolyticus and proteus. Additional surgery on 7/15 for transmetatarsal amputation. WBAT. PICC line placed  on 7/15 and she will need 6 weeks of antibiotics with tentative end date of 05/24/21. Now on Rocephin and Flagyl.Xarelto resumed for history of PE. Hb A1c of 8.9 with blood glucase maintained with SSI.    Patient's medical record from [facility]  has been reviewed by the rehabilitation admission coordinator and physician.   Past Medical History      Past Medical History:  Diagnosis Date   Anemia     Asthma     CHF (congestive heart failure) (HCC)      Pt reports that father has CHF, not her   Diabetes mellitus without complication ([place])     Dyslipidemia     GERD (gastroesophageal reflux disease)     Pulmonary embolism (HCC)     Stroke ([place])        Family History   family history includes Arthritis in her mother; Diabetes in her brother and mother; High blood pressure in her brother, father, mother, and sister; Hyperlipidemia in her brother, father, and mother.   Prior  Rehab/Hospitalizations Has the patient had prior rehab or hospitalizations prior to admission? Yes   Has the patient had major surgery during 100 days prior to admission? Yes              Current Medications   Current Facility-Administered Medications:   0.9 %  sodium chloride infusion (Manually program via Guardrails IV Fluids), , Intravenous, Once, Sherri Dixon, Sherri Neighbors, NP   acetaminophen (TYLENOL) tablet 650 mg, 650 mg, Oral, Q6H PRN, 650 mg at 04/09/21 0506 **OR** acetaminophen (TYLENOL) suppository 650 mg, 650 mg, Rectal, Q6H PRN, Sherri Crocker, MD   albuterol (VENTOLIN HFA) 108 (90 Base) MCG/ACT inhaler 1-2 puff, 1-2 puff, Inhalation, Q6H PRN, Sherri Crocker, MD   alum & mag hydroxide-simeth (MAALOX/MYLANTA) 200-200-20 MG/5ML suspension 15-30 mL, 15-30 mL, Oral, Q2H PRN, Sherri Crocker, MD   cefTRIAXone (ROCEPHIN) 2 g in sodium chloride 0.9 % 100 mL IVPB, 2 g, Intravenous, Q24H, Sherri Dixon, Sherri Dixon, Last Rate: 200 mL/hr at 04/16/21 2037, 2 g at 04/16/21 2037   Chlorhexidine  Gluconate Cloth 2 % PADS 6 each, 6 each, Topical, Daily, Sherri Cousins, MD, 6 each at 04/17/21 1051   docusate sodium (COLACE) capsule 100 mg, 100 mg, Oral, Daily, Sherri Crocker, MD, 100 mg at 04/17/21 1006   furosemide (LASIX) tablet 80 mg, 80 mg, Oral, BID, Sherri Crocker, MD, 80 mg at 04/17/21 1006   gabapentin (NEURONTIN) capsule 300 mg, 300 mg, Oral, Daily PRN, Sherri Crocker, MD, 300 mg at 04/14/21 2114   guaiFENesin-dextromethorphan (ROBITUSSIN DM) 100-10 MG/5ML syrup 15 mL, 15 mL, Oral, Q4H PRN, Sherri Crocker, MD   hydrALAZINE (APRESOLINE) injection 5 mg, 5 mg, Intravenous, Q20 Min PRN, Sherri Crocker, MD   hydrALAZINE (APRESOLINE) tablet 50 mg, 50 mg, Oral, TID, Sherri Crocker, MD, 50 mg at 04/17/21 1006   insulin aspart (novoLOG) injection 0-5 Units, 0-5 Units, Subcutaneous, QHS, Sherri Cousins, MD   insulin aspart (novoLOG) injection 0-9 Units, 0-9 Units, Subcutaneous, TID WC, Sherri Cousins, MD, 1 Units at 04/13/21 1744   insulin aspart (novoLOG) injection 3 Units, 3 Units, Subcutaneous, TID WC, Sherri Cousins, MD, 3 Units at 04/17/21 1317   irbesartan (AVAPRO) tablet 37.5 mg, 37.5 mg, Oral, Daily, Sherri Cousins, MD, 37.5 mg at 04/17/21 1005   labetalol (NORMODYNE) injection 10 mg, 10 mg, Intravenous, Q10 min PRN, Sherri Crocker, MD   magnesium sulfate IVPB 2 g 50 mL, 2 g, Intravenous, Daily PRN, Sherri Crocker, MD   metoprolol succinate (TOPROL-XL) 24 hr tablet 100 mg, 100 mg, Oral, Daily, Sherri Crocker, MD, 100 mg at 04/17/21 1006   metoprolol tartrate (LOPRESSOR) injection 2-5 mg, 2-5 mg, Intravenous, Q2H PRN, Sherri Crocker, MD   metroNIDAZOLE (FLAGYL) tablet 500 mg, 500 mg, Oral, Q8H, Sherri Dixon, Sherri Dixon, 500 mg at 04/17/21 1319   morphine 2 MG/ML injection 2 mg, 2 mg, Intravenous, Q4H PRN, Sherri Crocker, MD, 2 mg at 04/14/21 2114   ondansetron (ZOFRAN) injection 4 mg, 4 mg,  Intravenous, Q6H PRN, Sherri Crocker, MD, 4 mg at 04/17/21 1018   oxyCODONE (Oxy IR/ROXICODONE) immediate release tablet 5-10 mg, 5-10 mg, Oral, Q4H PRN, Sherri Crocker, MD, 10 mg at 04/17/21 0507   pantoprazole (PROTONIX) EC tablet 40 mg, 40 mg, Oral, q1800, Sherri Crocker, MD, 40 mg at 04/16/21 1729   phenol (CHLORASEPTIC) mouth spray 1 spray, 1 spray, Mouth/Throat, PRN, Sherri Crocker, MD  potassium chloride SA (KLOR-CON) CR tablet 20-40 mEq, 20-40 mEq, Oral, Daily PRN, Sherri Crocker, MD   rivaroxaban Alveda Reasons) tablet 20 mg, 20 mg, Oral, Daily, Sherri Cousins, MD, 20 mg at 04/17/21 1006   rosuvastatin (CRESTOR) tablet 40 mg, 40 mg, Oral, Daily, Sherri Crocker, MD, 40 mg at 04/17/21 1006   sodium chloride flush (NS) 0.9 % injection 10-40 mL, 10-40 mL, Intracatheter, Q12H, Sherri Cousins, MD, 10 mL at 04/17/21 1052   sodium chloride flush (NS) 0.9 % injection 10-40 mL, 10-40 mL, Intracatheter, PRN, Sherri Cousins, MD   Patients Current Diet:  Diet Order                  Diet - low sodium heart healthy             Diet heart healthy/carb modified Room service appropriate? 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 No   Prior Activity Level Limited Community (1-2x/wk): Independent, sedentary; drove   Prior Functional Level Self Care: Did the patient need help bathing, dressing, using the toilet or eating? Needed some help   Indoor Mobility: Did the patient need assistance with walking from room to room (with or without device)? Independent   Stairs: Did the patient  need assistance with internal or external stairs (with or without device)? Independent   Functional Cognition: Did the patient need help planning regular tasks such as shopping or remembering to take medications? Independent   Home Assistive Devices / Equipment Home Equipment: None   Prior Device Use: Indicate devices/aids used by the patient prior to current illness, exacerbation or injury? None of the above   Current Functional Level Cognition   Overall Cognitive Status: Within Functional Limits for tasks assessed Orientation Level: Oriented X4    Extremity Assessment (includes Sensation/Coordination)   Upper Extremity Assessment: Generalized weakness LUE Deficits / Details: impaird weakness 3/5 grossly (recent CVA)  Lower Extremity Assessment: LLE deficits/detail LLE Deficits / Details: 3- hip and 3/5 knee and ankle LLE:  (recent CVA affecting L side) LLE Coordination: decreased gross motor     ADLs   Overall ADL's : Needs assistance/impaired Eating/Feeding: Set up, Independent, Sitting Grooming: Wash/dry hands, Wash/dry face, Sitting, Supervision/safety, Set up Upper Body Bathing: Set up, Supervision/ safety, Sitting Upper Body Bathing Details (indicate cue type and reason): simulated Lower Body Bathing: Maximal assistance, Moderate assistance, Sitting/lateral leans Lower Body Bathing Details (indicate cue type and reason): simulated seated in recliner Upper Body Dressing : Set up, Supervision/safety, Sitting Lower Body Dressing: Total assistance Toilet Transfer: Minimal assistance, +2 for physical assistance, Cueing for sequencing, Cueing for safety Toilet Transfer Details (indicate cue type and reason): simulated with lateral scoot to drop arm recliner Toileting- Clothing Manipulation and Hygiene: Maximal assistance, Sitting/lateral lean Toileting - Clothing Manipulation Details (indicate cue type and reason): clothing mgt mod A leaning side to side General ADL Comments: pt  seated in recliner for groomimh/hygiene, LB bathing tasks leaning side to side     Mobility   Overal bed mobility: Needs Assistance Bed Mobility: Supine  to Sit, Sit to Supine Rolling: Mod assist Supine to sit: Min assist Sit to supine: Min assist General bed mobility comments: Min assistance to sit edge of bed to prepare for AP transfers.  Pt required min assistance to position and return back to bed.     Transfers   Overall transfer level: Needs assistance Equipment used: None Transfers: Anterior-Posterior Transfer Sit to Stand: Total assist, +2 physical assistance (+3 for safety) Anterior-Posterior transfers: Mod assist, +2 physical assistance  Lateral/Scoot Transfers: +2 physical assistance, +2 safety/equipment, Min assist, Mod assist General transfer comment: Pt performed posterior scoot from bed to [facility] with use of bed pad and mod assistance on each side to scoot into WC, Once in WC lowered legs to continue to scoot into WC comfortably.  Pt performed WC>bed with assistance to place Dixon feet in bed. and mod +2 to scoot forward back to bed.     Ambulation / Gait / Stairs / Wheelchair Mobility   Ambulation/Gait Ambulation/Gait assistance:  (remains unable to maintain weight bearing in standing without significant assistance.) General Gait Details: unable Wheelchair Mobility Wheelchair mobility: Yes Wheelchair propulsion: Both upper extremities Wheelchair parts: Needs assistance (Pt able to assist with locking and unlocking brakes but required total assistance with foot plates.  Pt able to use drive wheels for forward propulsion) Distance: 21f + 60 ft + 45 ft Wheelchair Assistance Details (indicate cue type and reason): Min guard to min assistance during forward propulsion to maintain straight path and for turns and backing.  Pt required decreased assistance with forward propulsion as trial progressed.     Posture / Balance Dynamic Sitting Balance Sitting balance - Comments: Pt able to  use UEs on armrests to scoot/push self to back of chair Balance Overall balance assessment: Needs assistance Sitting-balance support: Feet supported Sitting balance-Leahy Scale: Good Sitting balance - Comments: Pt able to use UEs on armrests to scoot/push self to back of chair Standing balance-Leahy Scale: Poor     Special needs/care consideration PICC placed for home IV antibiotics for 6 weeks Hgb a1c 8.1    Previous Home Environment  Living Arrangements: Spouse/significant other  Lives With: Spouse Available Help at Discharge: Family, Available 24 hours/day (spouse to take FMLA) Type of Home: House Home Layout: One level Home Access: Stairs to enter Entrance Stairs-Rails: Right, Left, Can reach both Entrance Stairs-Number of Steps: 3+1 Bathroom Shower/Tub: TChiropodist Standard Bathroom Accessibility: Yes How Accessible: Accessible via walker Home Care Services: No   Discharge Living Setting Plans for Discharge Living Setting: Patient's home, Lives with (comment) (spouse) Type of Home at Discharge: House Discharge Home Layout: One level Discharge Home Access: Stairs to enter Entrance Stairs-Rails: Right, Left, Can reach both Entrance Stairs-Number of Steps: 3 plus 1 Discharge Bathroom Shower/Tub: Tub/shower unit Discharge Bathroom Toilet: Standard Discharge Bathroom Accessibility: Yes How Accessible: Accessible via walker Does the patient have any problems obtaining your medications?: No   Social/Family/Support Systems Contact Information: spouse Anticipated Caregiver: spouse to take fmla Anticipated Caregiver's Contact Information: see above Ability/Limitations of Caregiver: no limitations; aware she will need home IV antibiotics Caregiver Availability: 24/7 Discharge Plan Discussed with Primary Caregiver: Yes Is Caregiver In Agreement with Plan?: Yes Does Caregiver/Family have Issues with Lodging/Transportation while Pt is in Rehab?: No    Goals Patient/Family Goal for Rehab: supervision to min assist with PT and OT Expected length of stay: ELOS 2 weeks Pt/Family Agrees to Admission and willing to participate: Yes Program Orientation Provided & Reviewed with Pt/Caregiver Including  Roles  & Responsibilities: Yes Decrease burden of Care through IP rehab admission: n/a   Possible need for SNF placement upon discharge: not anticipated; spouse to take FMLA   Patient Condition: I have reviewed medical records from [facility] , spoken with CM, and patient and spouse. I met with patient at the bedside for inpatient rehabilitation assessment.  Patient will benefit from ongoing PT and OT, can actively participate in 3 hours of therapy a day 5 days of the week, and can make measurable gains during the admission.  Patient will also benefit from the coordinated team approach during an Inpatient Acute Rehabilitation admission.  The patient will receive intensive therapy as well as Rehabilitation physician, nursing, social worker, and care management interventions.  Due to bladder management, bowel management, safety, skin/wound care, disease management, medication administration, pain management, and patient education the patient requires 24 hour a day rehabilitation nursing.  The patient is currently mod assist overall with mobility and basic ADLs.  Discharge setting and therapy post discharge at home with home health is anticipated.  Patient has agreed to participate in the Acute Inpatient Rehabilitation Program and will admit today.   Preadmission Screen Completed By:  Cleatrice Burke, 04/17/2021 2:16 PM ______________________________________________________________________   Discussed status with Dr. Letta Pate on  04/17/2021 at 1430 and received approval for admission today.   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 Yes Co-Morbidities requiring supervision/potential complications: Super morbid obesity, T2 DM with peripheral neuropathy , hx of PE on xarelto, Hx Right BG infarct minimal Left hemiparesis Due to bladder management, bowel management, safety, skin/wound care, disease management, medication administration, pain management, and patient education, does the patient require 24 hr/day rehab nursing? Yes Does the patient require coordinated care of a physician, rehab nurse, PT, OT, and SLP to address physical and functional deficits in the context of the above medical diagnosis(es)? Yes Addressing deficits in the following areas: balance, endurance, locomotion, strength, transferring, bowel/bladder control, bathing, dressing, feeding, grooming, toileting, and psychosocial support Can the patient actively participate in an intensive therapy program of at least 3 hrs of therapy 5 days a week? Yes The potential for patient to make measurable gains while on inpatient rehab is good Anticipated functional outcomes upon discharge from inpatient rehab: min assist PT, min assist OT, n/a SLP Estimated rehab length of stay to reach the above functional goals is: 14-17d Anticipated discharge destination: Home 10. Overall Rehab/Functional Prognosis: good     MD Signature: Sherri Dixon M.D. [facility] Fellow [employer] and [place] Board of Electrodiagnostic Med Fellow Am Board of Interventional Pain            Revision History                        Note Details  Author Sherri Blake, MD File Time 04/17/2021  2:55 PM  Author Type Physician Status Signed  Last Editor Sherri Blake, MD Service Physical Medicine and Rehabilitation

## 2021-04-17 NOTE — Progress Notes (Signed)
Patient admitted to CIR this afternoon at 5:20 pm. No complaints of pain at this time, call ight and personal belongings within reach. Angie Fava

## 2021-04-17 NOTE — H&P (Signed)
Physical Medicine and Rehabilitation Admission H&P        Chief Complaint  Patient presents with   Functional deficits    HPI: Sherri Dixon is a 40 year old female with history of HTN, T2DM, PE- on Xarelto, super morbid obesity- BMI 62, microcytic anemia; who was admitted on 04/05/21 with right foot pain with swelling and drainage after sustaining an injury a week prior to admission.  She was found to have SIRS with leukocytosis and tachycardia as well as purulent drainage with x-ray showing tissue emphysema concerning for gas/necrotizing infection.  She was started on broad-spectrum antibiotic and underwent I&D of multiple bursal cavity with wound VAC application by Dr. Lucia Gaskins.  Amputation recommended and Dr. Graylon Good who was consulted for input agreed with amputation due to significant burden of infection.  Vancomycin changed to daptomycin due to AKI.  Hospital course also significant for issues with urinary retention, electrolyte abnormality, microcytic anemia due to heavy manses as well as wound cultures positive for Proteus mirabilis and Streptococcus gallolyticus. She underwent right foot transmetatarsal amputation on 07/15 and to be NWB with surgical VAC in place.   AKI has resolved and ID recommends IV Ceftriaxone 2 g and IV Vanc for 6 total weeks with end date of 05/24/21. She had recurrent drop in H/H to 6.6 on 07/16 and was transfused with 2 units PRBC. WBC has continued to fluctuate with nadir 24.7 on 07/16 but is trending down to Therapy has been ongoing and patient continues to be limited by WB status, body habitus and weakness with debility. CIR recommended due to functional decline. ,      ROS Review of Systems  Constitutional:  Negative for chills, fever and weight loss.  HENT: Negative.    Eyes:  Negative for blurred vision, pain, discharge and redness.  Respiratory:  Negative for cough, hemoptysis and wheezing.   Cardiovascular:  Positive for leg swelling. Negative for chest pain  and orthopnea.  Gastrointestinal:  Positive for constipation. Negative for abdominal pain, heartburn, nausea and vomiting.  Genitourinary:  Negative for flank pain.  Musculoskeletal:  Positive for joint pain. Negative for neck pain.  Skin: Negative.   Neurological:  Positive for sensory change and focal weakness.  Endo/Heme/Allergies: Negative.   Psychiatric/Behavioral:  Negative for hallucinations and memory loss. The patient is not nervous/anxious.          Past Medical History:  Diagnosis Date   Anemia     Asthma     CHF (congestive heart failure) (HCC)      Pt reports that father has CHF, not her   Diabetes mellitus without complication ([place])     Dyslipidemia     GERD (gastroesophageal reflux disease)     Pulmonary embolism ([place])     Stroke [facility])             Past Surgical History:  Procedure Laterality Date   APPLICATION OF WOUND VAC Right 04/12/2021    Procedure: APPLICATION OF WOUND VAC;  Surgeon: Erle Crocker, MD;  Location: [place];  Service: Orthopedics;  Laterality: Right;   I & D EXTREMITY Right 04/06/2021    Procedure: IRRIGATION AND DEBRIDEMENT TOE WITH WOUND VAC PLACEMENT;  Surgeon: Erle Crocker, MD;  Location: [place];  Service: Orthopedics;  Laterality: Right;   I & D EXTREMITY Right 04/12/2021    Procedure: IRRIGATION AND DEBRIDEMENT RIGHT FOOT;  Surgeon: Erle Crocker, MD;  Location: [place];  Service: Orthopedics;  Laterality: Right;   MOUTH SURGERY  ROBOTIC ASSITED PARTIAL NEPHRECTOMY Right 07/28/2019    Procedure: XI ROBOTIC ASSITED PARTIAL NEPHRECTOMY;  Surgeon: Cleon Gustin, MD;  Location: [facility];  Service: Urology;  Laterality: Right;  3 HRS   TRANSMETATARSAL AMPUTATION Right 04/12/2021    Procedure: TRANSMETATARSAL AMPUTATION;  Surgeon: Erle Crocker, MD;  Location: [place];  Service: Orthopedics;  Laterality: Right;           Family History  Problem Relation Age of Onset   High blood pressure Mother     Diabetes Mother      Arthritis Mother     Hyperlipidemia Mother     High blood pressure Father     Hyperlipidemia Father     High blood pressure Sister     Diabetes Brother     Hyperlipidemia Brother     High blood pressure Brother        Social History:  reports that she has never smoked. She has never used smokeless tobacco. She reports current alcohol use of about 1.0 standard drink of alcohol per week. She reports that she does not use drugs.          Allergies  Allergen Reactions   Amlodipine Swelling   Humalog Kwikpen [Insulin Lispro] Other (See Comments)      75/50---swelling and ulcers in mouth             Medications Prior to Admission  Medication Sig Dispense Refill   cetirizine (ZYRTEC) 10 MG tablet Take 10 mg by mouth daily as needed for allergies.       FARXIGA 10 MG TABS tablet Take 10 mg by mouth daily as needed.       furosemide (LASIX) 40 MG tablet Take 2 tablets twice a day for 5 days, then take 1 tablet twice a day till you see your PCP. (Patient taking differently: Take 80 mg by mouth 2 (two) times daily. Take 2 tablets twice a day for 5 days, then take 1 tablet twice a day till you see your PCP.) 90 tablet 1   gabapentin (NEURONTIN) 300 MG capsule Take 300 mg by mouth daily as needed (pain).       hydrALAZINE (APRESOLINE) 50 MG tablet Take 1 tablet (50 mg total) by mouth 3 (three) times daily. 90 tablet 1   ibuprofen (ADVIL) 200 MG tablet Take 200 mg by mouth every 6 (six) hours as needed for moderate pain.       insulin aspart (NOVOLOG FLEXPEN) 100 UNIT/ML FlexPen Inject 12-20 Units as directed 3 (three) times daily. Sliding scale       Insulin Glargine (BASAGLAR KWIKPEN) 100 UNIT/ML Inject 50 Units into the skin at bedtime. (Patient taking differently: Inject 40-50 Units into the skin at bedtime. Sliding scale)       metoprolol succinate (TOPROL-XL) 100 MG 24 hr tablet Take 1 tablet (100 mg total) by mouth daily. 30 tablet 1   Multiple Vitamin (MULTIVITAMIN WITH MINERALS) TABS  tablet Take 1 tablet by mouth daily. Centrum       nitroGLYCERIN (NITROSTAT) 0.4 MG SL tablet Place 0.4 mg under the tongue every 5 (five) minutes as needed for chest pain.       olmesartan (BENICAR) 40 MG tablet Take 1 tablet (40 mg total) by mouth daily.   2   Pediatric Multivitamins-Iron (FLINTSTONES W/IRON) 18 MG CHEW Chew 1 tablet by mouth daily.       potassium chloride SA (KLOR-CON) 20 MEQ tablet Take 20 mEq by mouth daily.  rosuvastatin (CRESTOR) 40 MG tablet Take 1 tablet (40 mg total) by mouth daily. 30 tablet 0   Vitamin D, Ergocalciferol, (DRISDOL) 1.25 MG (50000 UNIT) CAPS capsule Take 50,000 Units by mouth once a week.       XARELTO 20 MG TABS tablet Take 20 mg by mouth daily.       Alcohol Swabs 70 % PADS 1 application by Does not apply route 3 (three) times daily before meals. 100 each 3   ferrous sulfate 325 (65 FE) MG EC tablet Take 1 tablet (325 mg total) by mouth 2 (two) times daily. (Patient not taking: Reported on 04/05/2021) 60 tablet 3   glucose blood test strip Use as instructed 100 each 3   Insulin Syringe-Needle U-100 (INSULIN SYRINGE 1CC/28G) 28G X 1/2" 1 ML MISC 1 application by Does not apply route 3 (three) times daily before meals. 100 each 0      Drug Regimen Review  Drug regimen was reviewed and remains appropriate with no significant issues identified   Home: Home Living Family/patient expects to be discharged to:: Inpatient rehab Living Arrangements: Spouse/significant other Available Help at Discharge: Family, Available PRN/intermittently Type of Home: House Home Access: Stairs to enter [employer] of Steps: 3+1 Entrance Stairs-Rails: Right, Left, Can reach both Home Layout: One level Bathroom Shower/Tub: Chiropodist: Standard Home Equipment: None   Functional History: Prior Function Level of Independence: Needs assistance Gait / Transfers Assistance Needed: ambulates independently with no AD ADL's / Homemaking  Assistance Needed: husband helps her do lower extremity dressing since stroke   Functional Status:  Mobility: Bed Mobility Overal bed mobility: Needs Assistance Bed Mobility: Supine to Sit, Sit to Supine Rolling: Mod assist Supine to sit: Min assist Sit to supine: Mod assist General bed mobility comments: Increased time and effort to scoot to edge of bed.  Pt required min assistance to move hips to edge of bed. Transfers Overall transfer level: Needs assistance Equipment used: Sliding board Transfers: Lateral/Scoot Transfers Sit to Stand: Total assist, +2 physical assistance (+3 for safety)  Lateral/Scoot Transfers: +2 physical assistance, +2 safety/equipment, Min assist, Mod assist General transfer comment: Min +2 lateral scoot to the R from high to low surface ( bed to [facility] ).  Pt performed from [facility] to recliner with increased assistance of mod +2.  L side noted to be more fatigued and required assistance to position L foot for leverage to push through during lateral slide board transfer. Ambulation/Gait Ambulation/Gait assistance:  (remains unable to maintain weight bearing in standing without significant assistance.) General Gait Details: unable Wheelchair Mobility Wheelchair mobility: Yes Wheelchair propulsion: Both upper extremities Wheelchair parts: Needs assistance (Pt able to assist with locking and unlocking brakes but required total assistance with foot plates.  Pt able to use drive wheels for forward propulsion) Distance: 70 ft x 3 trials with rest breaks between trials. Wheelchair Assistance Details (indicate cue type and reason): Min assistance during forward propulsion to maintain straight path and for turns and backing.  Pt required decreased assistance with forward propulsion as trial progressed.   ADL: ADL Overall ADL's : Needs assistance/impaired Eating/Feeding: Set up, Independent, Sitting Grooming: Wash/dry hands, Wash/dry face, Sitting, Supervision/safety, Set  up Upper Body Bathing: Set up, Supervision/ safety, Sitting Upper Body Bathing Details (indicate cue type and reason): simulated Lower Body Bathing: Maximal assistance, Moderate assistance, Sitting/lateral leans Lower Body Bathing Details (indicate cue type and reason): simulated seated in recliner Upper Body Dressing : Set up, Supervision/safety, Sitting Lower  Body Dressing: Total assistance Toilet Transfer: Minimal assistance, +2 for physical assistance, Cueing for sequencing, Cueing for safety Toilet Transfer Details (indicate cue type and reason): simulated with lateral scoot to drop arm recliner Toileting- Clothing Manipulation and Hygiene: Maximal assistance, Sitting/lateral lean Toileting - Clothing Manipulation Details (indicate cue type and reason): clothing mgt mod A leaning side to side General ADL Comments: pt seated in recliner for groomimh/hygiene, LB bathing tasks leaning side to side   Cognition: Cognition Overall Cognitive Status: Within Functional Limits for tasks assessed Orientation Level: Oriented X4 Cognition Arousal/Alertness: Awake/alert Behavior During Therapy: WFL for tasks assessed/performed Overall Cognitive Status: Within Functional Limits for tasks assessed     Blood pressure (!) 158/93, pulse 75, temperature 97.8 F (36.6 C), temperature source Oral, resp. rate 16, height 5' 2"  (1.575 m), weight 134.8 kg, last menstrual period 04/05/2021, SpO2 100 %. Physical Exam   General: No acute distress, morbidly obese Mood and affect are appropriate Heart: Regular rate and rhythm no rubs murmurs or extra sounds Lungs: Clear to auscultation, breathing unlabored, no rales or wheezes Abdomen: Positive bowel sounds, soft nontender to palpation, nondistended Extremities: No clubbing, cyanosis, or edema Skin: No evidence of breakdown, no evidence of rash Neurologic: Cranial nerves II through XII intact, motor strength is 5/5 in right and 4+ Left deltoid, bicep,  tricep, grip, hip flexor, knee extensors, ankle dorsiflexor and plantar flexor Sensory exam reduced sensation to light touch both feet  Musculoskeletal: Full range of motion in all 4 extremities. No joint swelling RIght TMA with bulky dressing just applied by ortho  Lab Results Last 48 Hours        Results for orders placed or performed during the hospital encounter of 04/05/21 (from the past 48 hour(s))  Glucose, capillary     Status: None    Collection Time: 04/15/21  1:02 PM  Result Value Ref Range    Glucose-Capillary 82 70 - 99 mg/dL      Comment: Glucose reference range applies only to samples taken after fasting for at least 8 hours.  Glucose, capillary     Status: Abnormal    Collection Time: 04/15/21  5:00 PM  Result Value Ref Range    Glucose-Capillary 102 (H) 70 - 99 mg/dL      Comment: Glucose reference range applies only to samples taken after fasting for at least 8 hours.  Glucose, capillary     Status: None    Collection Time: 04/15/21 10:47 PM  Result Value Ref Range    Glucose-Capillary 89 70 - 99 mg/dL      Comment: Glucose reference range applies only to samples taken after fasting for at least 8 hours.  CBC     Status: Abnormal    Collection Time: 04/16/21  4:13 AM  Result Value Ref Range    WBC 19.1 (H) 4.0 - 10.5 K/uL    RBC 2.88 (L) 3.87 - 5.11 MIL/uL    Hemoglobin 8.1 (L) 12.0 - 15.0 g/dL    HCT 25.6 (L) 36.0 - 46.0 %    MCV 88.9 80.0 - 100.0 fL    MCH 28.1 26.0 - 34.0 pg    MCHC 31.6 30.0 - 36.0 g/dL    RDW 15.8 (H) 11.5 - 15.5 %    Platelets 464 (H) 150 - 400 K/uL    nRBC 0.0 0.0 - 0.2 %      Comment: Performed at [facility] [address] [place], [place] [address]  Glucose, capillary  Status: Abnormal    Collection Time: 04/16/21  7:58 AM  Result Value Ref Range    Glucose-Capillary 102 (H) 70 - 99 mg/dL      Comment: Glucose reference range applies only to samples taken after fasting for at least 8 hours.  Glucose, capillary      Status: None    Collection Time: 04/16/21 12:24 PM  Result Value Ref Range    Glucose-Capillary 98 70 - 99 mg/dL      Comment: Glucose reference range applies only to samples taken after fasting for at least 8 hours.  Glucose, capillary     Status: Abnormal    Collection Time: 04/16/21  4:23 PM  Result Value Ref Range    Glucose-Capillary 115 (H) 70 - 99 mg/dL      Comment: Glucose reference range applies only to samples taken after fasting for at least 8 hours.  Glucose, capillary     Status: Abnormal    Collection Time: 04/16/21 10:13 PM  Result Value Ref Range    Glucose-Capillary 137 (H) 70 - 99 mg/dL      Comment: Glucose reference range applies only to samples taken after fasting for at least 8 hours.  CBC     Status: Abnormal    Collection Time: 04/17/21  4:41 AM  Result Value Ref Range    WBC 15.4 (H) 4.0 - 10.5 K/uL    RBC 2.99 (L) 3.87 - 5.11 MIL/uL    Hemoglobin 8.5 (L) 12.0 - 15.0 g/dL    HCT 26.5 (L) 36.0 - 46.0 %    MCV 88.6 80.0 - 100.0 fL    MCH 28.4 26.0 - 34.0 pg    MCHC 32.1 30.0 - 36.0 g/dL    RDW 15.7 (H) 11.5 - 15.5 %    Platelets 519 (H) 150 - 400 K/uL    nRBC 0.0 0.0 - 0.2 %      Comment: Performed at [facility] [address] [place], [place] [address]  Glucose, capillary     Status: Abnormal    Collection Time: 04/17/21  7:30 AM  Result Value Ref Range    Glucose-Capillary 110 (H) 70 - 99 mg/dL      Comment: Glucose reference range applies only to samples taken after fasting for at least 8 hours.      Imaging Results (Last 48 hours)  No results found.           Medical Problem List and Plan: 1.  Impaired self care and mobility  secondary to Right trans met amp with NWB x 6 wks             -patient may  shower with RLE wrapped              -ELOS/Goals: 14-17d , minA goals 2.  Antithrombotics: -DVT/anticoagulation:  Pharmaceutical: Xarelto             -antiplatelet therapy: N/AA 3. Pain Management: 4. Mood: LCSW to follow for  evaluation and support             -antipsychotic agents: N/AA 5. Neuropsych: This patient is capable of making decisions on her own behalf. 6. Skin/Wound Care: Continue wound VAC for now 7. Fluids/Electrolytes/Nutrition: Monitor intake/output.  Check c-Met in AM. 8.  Right foot infection: Continue ceftriaxone and vancomycin for 6 weeks with end date 08/26.             --Also on Flagyl-->? Duration.             --  Pharmacy to assist with monitoring vancomycin levels and adjusting dose. 9.  T2DM: Hemoglobin A1c-8.7.   --Will monitor blood sugars AC/at bedtime basis. CBG (last 3)  Recent Labs    04/16/21 2213 04/17/21 0730 04/17/21 1236  GLUCAP 137* 110* 96    10.  Microcytic anemia: Monitor H&H with serial checks.  Repeat CBC in AM. 11.  Acute kidney injury: Likely in setting of SIRS/infection and is slowly resolving             -- Monitor renal status with routine checks. 12.  History of severe malnutrition: Albumin 2.1 in May.  Recheck in AM.             -- Recheck magnesium levels in a.m. also 13.  Super morbid obesity: BMI 54--educate patient on weight loss to promote health and mobility.             -- We will consult dietitian for dietary education. 14.  Chronic diastolic CHF: Monitor for signs of overload.  Check weights daily.   --Continue metoprolol, hydralazine, furosemide and Crestor. 15.  HTN: Reports of dizziness with therapy.  Monitor for systolic changes.            Bary Leriche, PA-C 04/17/2021  "I have personally performed a face to face diagnostic evaluation of this patient.  Additionally, I have reviewed and concur with the physician assistant's documentation above." Charlett Blake M.D. [facility] Fellow Am Acad of Phys Med and Rehab Diplomate Am Board of Electrodiagnostic Med Fellow Am Board of Interventional Pain

## 2021-04-17 NOTE — Progress Notes (Signed)
Inpatient Rehabilitation Medication Review by a Pharmacist  A complete drug regimen review was completed for this patient to identify any potential clinically significant medication issues.  Clinically significant medication issues were identified:  no  Check AMION for pharmacist assigned to patient if future medication questions/issues arise during this admission.  Pharmacist comments:   Time spent performing this drug regimen review (minutes):  10   Ramond Craver 04/17/2021 6:19 PM

## 2021-04-17 NOTE — Progress Notes (Signed)
Physical Therapy Treatment Patient Details Name: Sherri Dixon MRN: [id number] DOB: 09-05-1981 Today's Date: 04/17/2021    History of Present Illness 40 y.o. female with medical history significant of asthma, chronic diastolic CHF, hypertension, hyperlipidemia, insulin-dependent diabetes melitis, chronic microcytic anemia, GERD, history of PE on Xarelto, morbid obesity (BMI 62.19), admitted for stroke in May 2022 presented to the ED with complaints of right foot pain, swelling, and drainage after she stepped on a piece of glass. 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    PT Comments    Pt supine in bed on arrival.  Performed anterior-posterior transfer this session and while she required increased assistance she felt more confident with this transition.  Pt continues to improve endurance and required decreased assistance for WC mobs.  Plan for aggressive rehab in a post acute setting remains appropriate.      Follow Up Recommendations  CIR     Equipment Recommendations  None recommended by PT    Recommendations for Other Services       Precautions / Restrictions Precautions Precautions: Fall Precaution Comments: wound vac Restrictions Weight Bearing Restrictions: Yes RLE Weight Bearing: Non weight bearing Other Position/Activity Restrictions: residual L side weakness - from old CVA    Mobility  Bed Mobility Overal bed mobility: Needs Assistance Bed Mobility: Supine to Sit;Sit to Supine     Supine to sit: Min assist Sit to supine: Min assist   General bed mobility comments: Min assistance to sit edge of bed to prepare for AP transfers.  Pt required min assistance to position and return back to bed.    Transfers Overall transfer level: Needs assistance Equipment used: None Transfers: Comptroller transfers: Mod assist;+2 physical assistance   General transfer comment: Pt performed posterior scoot from bed to [facility] with use  of bed pad and mod assistance on each side to scoot into WC, Once in WC lowered legs to continue to scoot into WC comfortably.  Pt performed WC>bed with assistance to place B feet in bed. and mod +2 to scoot forward back to bed.  Ambulation/Gait                 Theme park manager mobility: Yes Wheelchair propulsion: Both upper extremities Distance: 91ft + 60 ft + 23 ft Wheelchair Assistance Details (indicate cue type and reason): Min guard to min assistance during forward propulsion to maintain straight path and for turns and backing.  Pt required decreased assistance with forward propulsion as trial progressed.  Modified Rankin (Stroke Patients Only)       Balance Overall balance assessment: Needs assistance Sitting-balance support: Feet supported Sitting balance-Leahy Scale: Good       Standing balance-Leahy Scale: Poor                              Cognition Arousal/Alertness: Awake/alert Behavior During Therapy: WFL for tasks assessed/performed Overall Cognitive Status: Within Functional Limits for tasks assessed                                        Exercises      General Comments        Pertinent Vitals/Pain Pain Assessment: Faces Faces Pain Scale: Hurts little more Pain Location:  R foot Pain Descriptors / Indicators: Grimacing;Guarding;Discomfort Pain Intervention(s): Monitored during session;Repositioned    Home Living                      Prior Function            PT Goals (current goals can now be found in the care plan section) Acute Rehab PT Goals Patient Stated Goal: get to rehab after hosp Potential to Achieve Goals: Good Additional Goals Additional Goal #1: Propel wheelchair with mod I for 200' with NWB on RLE Progress towards PT goals: Progressing toward goals    Frequency    Min 5X/week      PT Plan Current plan remains  appropriate    Co-evaluation              AM-PAC PT "6 Clicks" Mobility   Outcome Measure  Help needed turning from your back to your side while in a flat bed without using bedrails?: A Lot Help needed moving from lying on your back to sitting on the side of a flat bed without using bedrails?: A Lot Help needed moving to and from a bed to a chair (including a wheelchair)?: A Lot Help needed standing up from a chair using your arms (e.g., wheelchair or bedside chair)?: Total Help needed to walk in hospital room?: Total Help needed climbing 3-5 steps with a railing? : Total 6 Click Score: 9    End of Session Equipment Utilized During Treatment: Gait belt Activity Tolerance: Patient tolerated treatment well Patient left: in chair;with call bell/phone within reach;with chair alarm set Nurse Communication: Mobility status;Need for lift equipment PT Visit Diagnosis: Unsteadiness on feet (R26.81);Pain Pain - Right/Left: Right Pain - part of body: Ankle and joints of foot     Time: 1134-1201 PT Time Calculation (min) (ACUTE ONLY): 27 min  Charges:  $Therapeutic Activity: 8-22 mins $Wheel Chair Management: 8-22 mins                     Erasmo Leventhal , PTA Acute Rehabilitation Services Pager [phone] Office [phone]    [name] [name] 04/17/2021, 1:00 PM

## 2021-04-17 NOTE — Progress Notes (Signed)
Inpatient Rehabilitation  Patient information reviewed and entered into eRehab system by Caroll Weinheimer M. Calton Harshfield, M.A., CCC/SLP, PPS Coordinator.  Information including medical coding, functional ability and quality indicators will be reviewed and updated through discharge.    

## 2021-04-17 NOTE — Progress Notes (Signed)
Inpatient Rehabilitation Admissions Coordinator   I have insurance approval and [facility] bed to admit today. Dr Verlon Au, acute tem and TOC made aware. I spoke with patient and her spouse and they are in agreement. I will make the arrangements to admit today.  Danne Baxter, RN, MSN Rehab Admissions Coordinator [phone] 04/17/2021 2:12 PM

## 2021-04-18 DIAGNOSIS — Z89431 Acquired absence of right foot: Secondary | ICD-10-CM | POA: Diagnosis not present

## 2021-04-18 LAB — COMPREHENSIVE METABOLIC PANEL
ALT: 18 U/L (ref 0–44)
AST: 16 U/L (ref 15–41)
Albumin: <1 g/dL — ABNORMAL LOW (ref 3.5–5.0)
Alkaline Phosphatase: 174 U/L — ABNORMAL HIGH (ref 38–126)
Anion gap: 4 — ABNORMAL LOW (ref 5–15)
BUN: 10 mg/dL (ref 6–20)
CO2: 32 mmol/L (ref 22–32)
Calcium: 7.7 mg/dL — ABNORMAL LOW (ref 8.9–10.3)
Chloride: 101 mmol/L (ref 98–111)
Creatinine, Ser: 1.09 mg/dL — ABNORMAL HIGH (ref 0.44–1.00)
GFR, Estimated: >60 mL/min (ref >60)
Glucose, Bld: 137 mg/dL — ABNORMAL HIGH (ref 70–99)
Potassium: 2.8 mmol/L — ABNORMAL LOW (ref 3.5–5.1)
Sodium: 137 mmol/L (ref 135–145)
Total Bilirubin: 0.5 mg/dL (ref 0.3–1.2)
Total Protein: 5.7 g/dL — ABNORMAL LOW (ref 6.5–8.1)

## 2021-04-18 LAB — CBC WITH DIFFERENTIAL/PLATELET
Abs Immature Granulocytes: 0.08 10*3/uL — ABNORMAL HIGH (ref 0.00–0.07)
Basophils Absolute: 0.1 10*3/uL (ref 0.0–0.1)
Basophils Relative: 0 %
Eosinophils Absolute: 0.2 10*3/uL (ref 0.0–0.5)
Eosinophils Relative: 1 %
HCT: 25.4 % — ABNORMAL LOW (ref 36.0–46.0)
Hemoglobin: 8.2 g/dL — ABNORMAL LOW (ref 12.0–15.0)
Immature Granulocytes: 1 %
Lymphocytes Relative: 18 %
Lymphs Abs: 2.3 10*3/uL (ref 0.7–4.0)
MCH: 28.6 pg (ref 26.0–34.0)
MCHC: 32.3 g/dL (ref 30.0–36.0)
MCV: 88.5 fL (ref 80.0–100.0)
Monocytes Absolute: 0.7 10*3/uL (ref 0.1–1.0)
Monocytes Relative: 5 %
Neutro Abs: 9.7 10*3/uL — ABNORMAL HIGH (ref 1.7–7.7)
Neutrophils Relative %: 75 %
Platelets: 518 10*3/uL — ABNORMAL HIGH (ref 150–400)
RBC: 2.87 MIL/uL — ABNORMAL LOW (ref 3.87–5.11)
RDW: 15.8 % — ABNORMAL HIGH (ref 11.5–15.5)
WBC: 12.9 10*3/uL — ABNORMAL HIGH (ref 4.0–10.5)
nRBC: 0 % (ref 0.0–0.2)

## 2021-04-18 LAB — GLUCOSE, CAPILLARY
Glucose-Capillary: 118 mg/dL — ABNORMAL HIGH (ref 70–99)
Glucose-Capillary: 153 mg/dL — ABNORMAL HIGH (ref 70–99)
Glucose-Capillary: 147 mg/dL — ABNORMAL HIGH (ref 70–99)
Glucose-Capillary: 173 mg/dL — ABNORMAL HIGH (ref 70–99)

## 2021-04-18 LAB — VITAMIN D 25 HYDROXY (VIT D DEFICIENCY, FRACTURES): Vit D, 25-Hydroxy: 25.01 ng/mL — ABNORMAL LOW (ref 30–100)

## 2021-04-18 LAB — MAGNESIUM: Magnesium: 1.4 mg/dL — ABNORMAL LOW (ref 1.7–2.4)

## 2021-04-18 MED ORDER — SODIUM CHLORIDE 0.9% FLUSH
10.0000 mL | INTRAVENOUS | Status: DC | PRN
  Administered 2021-04-25: 10 mL

## 2021-04-18 MED ORDER — MAGNESIUM SULFATE 2 GM/50ML IV SOLN
2.0000 g | Freq: Once | INTRAVENOUS | Status: AC
  Administered 2021-04-18: 2 g via INTRAVENOUS
  Filled 2021-04-18: qty 50

## 2021-04-18 MED ORDER — VITAMIN D (ERGOCALCIFEROL) 1.25 MG (50000 UNIT) PO CAPS
50000.0000 [IU] | ORAL_CAPSULE | ORAL | Status: DC
  Administered 2021-04-18 – 2021-04-25 (×2): 50000 [IU] via ORAL
  Filled 2021-04-18 (×2): qty 1

## 2021-04-18 MED ORDER — POTASSIUM CHLORIDE 20 MEQ PO PACK
40.0000 meq | PACK | Freq: Two times a day (BID) | ORAL | Status: DC
  Administered 2021-04-18 – 2021-04-19 (×3): 40 meq via ORAL
  Filled 2021-04-18 (×3): qty 2

## 2021-04-18 NOTE — Evaluation (Signed)
Occupational Therapy Assessment and Plan  Patient Details  Name: Sherri Dixon MRN: [id number] Date of Birth: 01-29-1981  OT Diagnosis: muscle weakness (generalized) and amputation of right toes Rehab Potential:   ELOS: 3 weeks   Today's Date: 04/18/2021 OT Individual Time: 1448-1856 OT Individual Time Calculation (min): 62 min     Hospital Problem: Principal Problem:   Partial nontraumatic amputation of right foot ([place]) Active Problems:   S/P transmetatarsal amputation of foot, right ([place])   Past Medical History:  Past Medical History:  Diagnosis Date   Anemia    Asthma    CHF (congestive heart failure) ([place])    Pt reports that father has CHF, not her   Diabetes mellitus without complication ([place])    Dyslipidemia    GERD (gastroesophageal reflux disease)    Pulmonary embolism ([place])    Stroke [facility])    Past Surgical History:  Past Surgical History:  Procedure Laterality Date   APPLICATION OF WOUND VAC Right 04/12/2021   Procedure: APPLICATION OF WOUND VAC;  Surgeon: Erle [place], MD;  Location: [place];  Service: Orthopedics;  Laterality: Right;   I & D EXTREMITY Right 04/06/2021   Procedure: IRRIGATION AND DEBRIDEMENT TOE WITH WOUND VAC PLACEMENT;  Surgeon: Erle [place], MD;  Location: [place];  Service: Orthopedics;  Laterality: Right;   I & D EXTREMITY Right 04/12/2021   Procedure: IRRIGATION AND DEBRIDEMENT RIGHT FOOT;  Surgeon: Erle [place], MD;  Location: [place];  Service: Orthopedics;  Laterality: Right;   MOUTH SURGERY     ROBOTIC ASSITED PARTIAL NEPHRECTOMY Right 07/28/2019   Procedure: XI ROBOTIC ASSITED PARTIAL NEPHRECTOMY;  Surgeon: Cleon Gustin, MD;  Location: [facility];  Service: Urology;  Laterality: Right;  3 HRS   TRANSMETATARSAL AMPUTATION Right 04/12/2021   Procedure: TRANSMETATARSAL AMPUTATION;  Surgeon: Erle [place], MD;  Location: [place];  Service: Orthopedics;  Laterality: Right;    Assessment & Plan Clinical Impression:  Patient is a 40 y.o. year old female with history of HTN, T2DM, PE- on Xarelto, super morbid obesity- BMI 62, microcytic anemia; who was admitted on 04/05/21 with right foot pain with swelling and drainage after sustaining an injury a week prior to admission.  She was found to have SIRS with leukocytosis and tachycardia as well as purulent drainage with x-ray showing tissue emphysema concerning for gas/necrotizing infection.  She was started on broad-spectrum antibiotic and underwent I&D of multiple bursal cavity with wound VAC application by Dr. Lucia Gaskins.  Amputation recommended and Dr. Graylon Good who was consulted for input agreed with amputation due to significant burden of infection.  Vancomycin changed to daptomycin due to AKI.  Hospital course also significant for issues with urinary retention, electrolyte abnormality, microcytic anemia due to heavy manses as well as wound cultures positive for Proteus mirabilis and Streptococcus gallolyticus. She underwent right foot transmetatarsal amputation on 07/15 and to be NWB with surgical VAC in place.   AKI has resolved and ID recommends IV Ceftriaxone 2 g and IV Vanc for 6 total weeks with end date of 05/24/21. She had recurrent drop in H/H to 6.6 on 07/16 and was transfused with 2 units PRBC. WBC has continued to fluctuate with nadir 24.7 on 07/16 but is trending down to Therapy has been ongoing and patient continues to be limited by WB status, body habitus and weakness with debility.   Patient transferred to CIR on 04/17/2021 .    Patient currently requires total with basic self-care skills secondary to muscle  weakness, decreased cardiorespiratoy endurance, and decreased standing balance and decreased postural control.  Prior to hospitalization, patient could complete ADL with mod.  Patient will benefit from skilled intervention to decrease level of assist with basic self-care skills and increase independence with basic self-care skills prior to discharge home with  care partner.  Anticipate patient will require minimal physical assistance and follow up home health.  OT - End of Session Endurance Deficit: Yes Endurance Deficit Description: required rest breaks OT Assessment OT Patient demonstrates impairments in the following area(s): Balance;Edema;Endurance;Motor;Skin Integrity OT Basic ADL's Functional Problem(s): Grooming;Bathing;Dressing;Toileting OT Advanced ADL's Functional Problem(s): Simple Meal Preparation OT Transfers Functional Problem(s): Toilet;Tub/Shower OT Plan OT Intensity: Minimum of 1-2 x/day, 45 to 90 minutes OT Frequency: 5 out of 7 days OT Duration/Estimated Length of Stay: 3 weeks OT Treatment/Interventions: Discharge planning;DME/adaptive equipment instruction;Functional mobility training;Balance/vestibular training;Patient/family education;Self Care/advanced ADL retraining;Skin care/wound managment;Therapeutic Activities;Therapeutic Exercise;UE/LE Strength taining/ROM;Wheelchair propulsion/positioning OT Self Feeding Anticipated Outcome(s): independent OT Basic Self-Care Anticipated Outcome(s): min A OT Toileting Anticipated Outcome(s): min A OT Bathroom Transfers Anticipated Outcome(s): min A OT Recommendation Patient destination: Home Follow Up Recommendations: Home health OT Equipment Recommended: 3 in 1 bedside comode;Sliding board (bariatric drop arm commode likely needed)   OT Evaluation Precautions/Restrictions  Precautions Precautions: Fall Restrictions Weight Bearing Restrictions: Yes RLE Weight Bearing: Non weight bearing Other Position/Activity Restrictions: residual L side weakness - from old CVA General   Vital Signs Therapy Vitals Temp: 98 F (36.7 C) Temp Source: Oral Pulse Rate: 61 Resp: 16 BP: 107/70 Patient Position (if appropriate): Sitting Oxygen Therapy SpO2: 100 % O2 Device: Room Air Pain Pain Assessment Pain Scale: 0-10 Pain Score: 5  Pain Type: Surgical pain;Phantom pain Pain  Location: Foot Pain Orientation: Right Pain Descriptors / Indicators: Discomfort Pain Intervention(s): Repositioned Home Living/Prior Functioning Home Living Family/patient expects to be discharged to:: Private residence Living Arrangements: Spouse/significant other (Plans on taking FMLA) Available Help at Discharge: Family, Available 24 hours/day Type of Home: House Home Access: Stairs to enter [employer] of Steps: 3+1 Entrance Stairs-Rails: Right, Left, Can reach both Home Layout: One level Bathroom Shower/Tub: Government social research officer Accessibility: Yes  Lives With: Spouse Prior Function Level of Independence: Needs assistance with ADLs Bath: Moderate Toileting: Moderate Dressing: Moderate  Able to Take Stairs?: No Driving: Yes Vocation: Unemployed Vocation Requirements: working up until her stroke. Comments: had stroke 3 weeks ago. Vision Baseline Vision/History: Wears glasses Wears Glasses:  (at all times, does not have them currently as they need to be repaired) Patient Visual Report: No change from baseline Vision Assessment?: No apparent visual deficits Perception  Perception: Within Functional Limits Praxis Praxis: Intact Cognition Overall Cognitive Status: Within Functional Limits for tasks assessed Arousal/Alertness: Awake/alert Orientation Level: Person;Place;Situation Person: Oriented Place: Oriented Situation: Oriented Year: 2022 Month: July Day of Week: Correct Memory: Appears intact Immediate Memory Recall: Sock;Blue;Bed Memory Recall Sock: Without Cue Memory Recall Blue: Without Cue Memory Recall Bed: Not able to recall Awareness: Appears intact Problem Solving: Appears intact Safety/Judgment: Appears intact Sensation Sensation Light Touch: Appears Intact Proprioception: Appears Intact Coordination Fine Motor Movements are Fluid and Coordinated: Yes Coordination and Movement Description: grossly  uncoordinated due to generalized weakness/deconditioning, pain, fatigue, and body habitus Finger Nose Finger Test: [facility] bilaterally Motor  Motor Motor: Abnormal postural alignment and control;Hemiplegia Motor - Skilled Clinical Observations: grossly uncoordinated due to mild L hemi, generalized weakness/deconditioning, pain, fatigue, and body habitus  Trunk/Postural Assessment     Balance Static Sitting Balance Static Sitting -  Level of Assistance: 5: Stand by assistance Dynamic Sitting Balance Dynamic Sitting - Level of Assistance: 5: Stand by assistance Extremity/Trunk Assessment RUE Assessment RUE Assessment: Within Functional Limits LUE Assessment LUE Assessment: Within Functional Limits General Strength Comments: 4/5 proximal, 5/5 distal  Care Tool Care Tool Self Care Eating   Eating Assist Level: Independent    Oral Care    Oral Care Assist Level: Set up assist    Bathing              Upper Body Dressing(including orthotics)   What is the patient wearing?: Pull over shirt   Assist Level: Supervision/Verbal cueing    Lower Body Dressing (excluding footwear)   What is the patient wearing?: Pants;Incontinence brief Assist for lower body dressing: Dependent - Patient 0%    Putting on/Taking off footwear   What is the patient wearing?: Non-skid slipper socks Assist for footwear: Dependent - Patient 0%       Care Tool Toileting Toileting activity   Assist for toileting: Dependent - Patient 0%     Care Tool Bed Mobility Roll left and right activity        Sit to lying activity        Lying to sitting edge of bed activity         Care Tool Transfers Sit to stand transfer        Chair/bed transfer         Toilet transfer   Assist Level: 2 Helpers     Care Tool Cognition Expression of Ideas and Wants Expression of Ideas and Wants: Without difficulty (complex and basic) - expresses complex messages without difficulty and with speech that is clear  and easy to understand   Understanding Verbal and Non-Verbal Content Understanding Verbal and Non-Verbal Content: Understands (complex and basic) - clear comprehension without cues or repetitions   Memory/Recall Ability *first 3 days only Memory/Recall Ability *first 3 days only: Current season;Location of own room;That he or she is in a hospital/hospital unit    Refer to Care Plan for [place] 1 OT Short Term Goal 1 (Week 1): patient will complete lower body bathing and dressing at bed level with mod/max A OT Short Term Goal 2 (Week 1): patient will complete SB transfer to/from level surfaces with mod A of 2 OT Short Term Goal 3 (Week 1): patient will complete toileting on drop arm commode with mod a of 2  Recommendations for other services: None    Skilled Therapeutic Intervention  Patient seated in w/c, alert and ready for therapy session.  She is pleasant and cooperative.  Reviewed role of OT and plan for session.  Evaluation completed as documented above.   Patient notes that she was getting around pretty well after her stroke but did need some help for adl and mobility.  She currently presents with significant mobility deficits and difficulty maintaining NWB on right foot due to generalized weakness and body habitus.  She is a good candidate for IP rehab to maximize functional independence and facilitate a safe return to home with appropriate strategies and DME.  She participated in adl tasks w/c level this session and light conditioning activities with excellent effort.  Completed repositioning activities due to pain and edema management.  She remained in the w/c for upcoming PT session, call bell and tray table in reach.    ADL ADL Eating: Set up Where Assessed-Eating: Wheelchair Grooming: Setup Where Assessed-Grooming: Clinical biochemist  Bathing: Minimal assistance Where Assessed-Upper Body Bathing: Wheelchair Lower Body Bathing: Dependent Where  Assessed-Lower Body Bathing: Bed level Upper Body Dressing: Setup Where Assessed-Upper Body Dressing: Wheelchair Lower Body Dressing: Dependent Where Assessed-Lower Body Dressing: Bed level Toileting: Dependent Where Assessed-Toileting: Bed level      Discharge Criteria: Patient will be discharged from OT if patient refuses treatment 3 consecutive times without medical reason, if treatment goals not met, if there is a change in medical status, if patient makes no progress towards goals or if patient is discharged from hospital.  The above assessment, treatment plan, treatment alternatives and goals were discussed and mutually agreed upon: by patient  Carlos Levering 04/18/2021, 4:34 PM

## 2021-04-18 NOTE — Progress Notes (Signed)
Physical Therapy Session Note  Patient Details  Name: Sherri Dixon MRN: [id number] Date of Birth: [birth year]  Today's Date: 04/18/2021 PT Individual Time: 5462-7035 PT Individual Time Calculation (min): 54 min   Short Term Goals: Week 1:  PT Short Term Goal 1 (Week 1): pt will transfer bed<>chair with LRAD and max of 1 PT Short Term Goal 2 (Week 1): pt will transfer sit<>stand with max A of 1 PT Short Term Goal 3 (Week 1): pt will perform WC mobility 35ft with supervision  Skilled Therapeutic Interventions/Progress Updates:   Received pt sitting in WC, pt agreeable to PT treatment, and reported pain 7/10 in RLE (premedicated). Repositioning and distraction done to reduce pain levels. Pt reported feeling like she had a BM and RN requesting therapist's assistance in getting pt into new room (5C06 - to fit [place]). Session with emphasis on functional mobility/transfers, toileting, peri-care, generalized strengthening, and improved activity tolerance. IV team present to disconnect IV. Pt transferred WC<>bed via slideboard to R (but uphill) with max A+2. Pt required cues for hand placement on board, head/hips relationship, and sequencing when scooting. Placed RLE on pillow to maintain NWB precautions. Pt transferred sit<>supine with total A for LE management and scooted to [facility] with +2 assist with pt assisting by pulling on headboard. Rolled L/R with supervision and use of bedrails and required +2 assist to doff soiled brief and perform peri-care. Pt reported urge to urinate but upon set up was unable to void. Assisted NT with transporting pt across hallway to 5C06 and assisted pt with getting comfortable and situated. Attempted to locate 24x18 WC as 22x18 too small, however none available. Pt reported feeling better after getting back in bed and that pain decreased to 5/10. Concluded session with pt semi-reclined in bed, needs within reach, and bed alarm on.   Therapy Documentation Precautions:   Restrictions Weight Bearing Restrictions: Yes RLE Weight Bearing: Non weight bearing  Therapy/Group: Individual Therapy Alfonse Alpers PT, DPT   04/18/2021, 7:31 AM

## 2021-04-18 NOTE — Progress Notes (Signed)
Patient ID: Sherri Dixon, female   DOB: 10-08-1980, 40 y.o.   MRN: [id number]  Met with patient in her room, introduced myself, and explained my role in her care. Went over the [employer] and gave her additional information on High Cholesterol, HTN, Living with Diabetes, Dyslipidemia, and Living with Heart Failure. Explained how to use her pain medication so that she could be productive in therapy, and gave her some safety ideas for home. Answered all of her questions that she had at this time with verbal understanding. I will continue to monitor this patient.  Dorthula Nettles, RN3, BSN, CBIS, [place], [facility], Inpatient Rehabilitation Office [phone] Cell [phone]

## 2021-04-18 NOTE — Progress Notes (Signed)
Nutrition Brief Note  RD received consult from RN that stated, "please help pt with food choices, needs condiments"  Wt Readings from Last 15 Encounters:  04/18/21 132 kg  04/17/21 134.8 kg  01/25/21 (!) 152 kg  12/15/20 (!) 151.1 kg  07/28/19 122.4 kg  07/19/19 122.4 kg  03/11/19 (!) 141 kg  05/22/18 (S) 131.5 kg  06/05/16 (!) 140.6 kg  Note PMH includes CHF which likely contributes to weight fluctuations  Body mass index is 53.23 kg/m. Patient meets criteria for morbid obesity based on current BMI.   Current diet order is heart healthy/carb modified, patient is consuming approximately 100% of meals at this time. Labs and medications reviewed.   RD changed pt to appropriate for room service WITH ASSIST so food and nutrition staff will assist pt with ordering. Also placed note on pt's Health Touch chart to alert [facility] staff to provide pt with additional condiments as requested. Please note units should have condiments stocked as well. No additional nutrition interventions warranted at this time. If nutrition issues arise, please consult RD.   Larkin Ina, MS, RD, LDN (she/her/hers) RD pager number and weekend/on-call pager number located in [place].

## 2021-04-18 NOTE — Progress Notes (Addendum)
PROGRESS NOTE   Subjective/Complaints: No complaints this morning Friend is visiting Denies pain Last BM Monday but she feels comfortable and does not want to increase stool softeners  ROS: +constipation   Objective:   No results found. Recent Labs    04/17/21 0441 04/18/21 0713  WBC 15.4* 12.9*  HGB 8.5* 8.2*  HCT 26.5* 25.4*  PLT 519* 518*   Recent Labs    04/18/21 0713  NA 137  K 2.8*  CL 101  CO2 32  GLUCOSE 137*  BUN 10  CREATININE 1.09*  CALCIUM 7.7*    Intake/Output Summary (Last 24 hours) at 04/18/2021 1122 Last data filed at 04/18/2021 0421 Gross per 24 hour  Intake --  Output 1400 ml  Net -1400 ml        Physical Exam: Vital Signs Blood pressure (!) 153/78, pulse 77, temperature 98.1 F (36.7 C), temperature source Oral, resp. rate 18, height 5' 2"  (1.575 m), weight 132 kg, last menstrual period 04/05/2021, SpO2 98 %. Gen: no distress, normal appearing HEENT: oral mucosa pink and moist, NCAT Cardio: Reg rate Chest: normal effort, normal rate of breathing Abd: soft, non-distended Ext: no edema Skin: No evidence of breakdown, no evidence of rash Neurologic: Cranial nerves II through XII intact, motor strength is 5/5 in right and 4+ Left deltoid, bicep, tricep, grip, hip flexor, knee extensors, ankle dorsiflexor and plantar flexor Sensory exam reduced sensation to light touch both feet   Musculoskeletal: Full range of motion in all 4 extremities. No joint swelling RIght TMA with bulky dressing just applied by ortho    Assessment/Plan: 1. Functional deficits which require 3+ hours per day of interdisciplinary therapy in a comprehensive inpatient rehab setting. Physiatrist is providing close team supervision and 24 hour management of active medical problems listed below. Physiatrist and rehab team continue to assess barriers to discharge/monitor patient progress toward functional and  medical goals  Care Tool:  Bathing              Bathing assist       Upper Body Dressing/Undressing Upper body dressing   What is the patient wearing?: Hospital gown only    Upper body assist Assist Level: Maximal Assistance - Patient 25 - 49%    Lower Body Dressing/Undressing Lower body dressing            Lower body assist       Toileting Toileting    Toileting assist Assist for toileting: Maximal Assistance - Patient 25 - 49%     Transfers Chair/bed transfer  Transfers assist     Chair/bed transfer assist level: Moderate Assistance - Patient 50 - 74% (lateral scoot +2)     Locomotion Ambulation   Ambulation assist              Walk 10 feet activity   Assist           Walk 50 feet activity   Assist           Walk 150 feet activity   Assist           Walk 10 feet on uneven surface  activity   Assist  Wheelchair     Assist               Wheelchair 50 feet with 2 turns activity    Assist            Wheelchair 150 feet activity     Assist          Blood pressure (!) 153/78, pulse 77, temperature 98.1 F (36.7 C), temperature source Oral, resp. rate 18, height 5' 2"  (1.575 m), weight 132 kg, last menstrual period 04/05/2021, SpO2 98 %.    Medical Problem List and Plan: 1.  Impaired self care and mobility  secondary to Right trans met amp with NWB x 6 wks             -patient may  shower with RLE wrapped             -ELOS/Goals: 14-17d , minA goals  Continue CIR 2.  Antithrombotics: -DVT/anticoagulation:  Pharmaceutical: Xarelto             -antiplatelet therapy: N/AA 3. Pain Management: Denies pain.  4. Mood: LCSW to follow for evaluation and support             -antipsychotic agents: N/AA 5. Neuropsych: This patient is capable of making decisions on her own behalf. 6. Skin/Wound Care: Continue wound VAC for now 7. Fluids/Electrolytes/Nutrition: Monitor  intake/output.  Check c-Met in AM. 8.  Right foot infection: Continue rocephin and flagyl for 6 weeks with end date 08/26. 9.  T2DM: Hemoglobin A1c-8.7.   --Will monitor blood sugars AC/at bedtime basis. CBG (last 3)  Recent Labs (last 2 labs)        Recent Labs    04/16/21 2213 04/17/21 0730 04/17/21 1236  GLUCAP 137* 110* 96      10.  Microcytic anemia: Monitor H&H with serial checks.  Repeat CBC in AM. 11.  Acute kidney injury: Likely in setting of SIRS/infection and is slowly resolving             -- Monitor renal status with routine checks. 12.  History of severe malnutrition: Albumin 2.1 in May.  Recheck in AM.             -- Recheck magnesium levels in a.m. also 13.  Super morbid obesity: BMI 54--educate patient on weight loss to promote health and mobility.             -- We will consult dietitian for dietary education. 14.  Chronic diastolic CHF: Monitor for signs of overload.  Check weights daily.   --Continue metoprolol, hydralazine, furosemide and Crestor. 15.  HTN: Reports of dizziness with therapy.  Monitor for systolic changes. 16. Vitamin D deficiency: start ergocalciferol 50,000U weekly for 7 weeks 17. Hypokalemia: supplement 75mq BID and repeat level tomorrow 18. Hypomagnesemia: supplement 2g IV today and repeat level tomorrow.   LOS: 1 days A FACE TO FACE EVALUATION WAS PERFORMED  Gabryel Files P Jazmina Muhlenkamp 04/18/2021, 11:22 AM

## 2021-04-18 NOTE — Progress Notes (Signed)
Inpatient Rehabilitation Care Coordinator Assessment and Plan Patient Details  Name: Sherri Dixon MRN: [id number] Date of Birth: July 05, 1981  Today's Date: 04/18/2021  Hospital Problems: Principal Problem:   Partial nontraumatic amputation of right foot ([place]) Active Problems:   S/P transmetatarsal amputation of foot, right ([place])  Past Medical History:  Past Medical History:  Diagnosis Date   Anemia    Asthma    CHF (congestive heart failure) ([place])    Pt reports that father has CHF, not her   Diabetes mellitus without complication ([place])    Dyslipidemia    GERD (gastroesophageal reflux disease)    Pulmonary embolism ([place])    Stroke [facility])    Past Surgical History:  Past Surgical History:  Procedure Laterality Date   APPLICATION OF WOUND VAC Right 04/12/2021   Procedure: APPLICATION OF WOUND VAC;  Surgeon: Erle Crocker, MD;  Location: [place];  Service: Orthopedics;  Laterality: Right;   I & D EXTREMITY Right 04/06/2021   Procedure: IRRIGATION AND DEBRIDEMENT TOE WITH WOUND VAC PLACEMENT;  Surgeon: Erle Crocker, MD;  Location: [place];  Service: Orthopedics;  Laterality: Right;   I & D EXTREMITY Right 04/12/2021   Procedure: IRRIGATION AND DEBRIDEMENT RIGHT FOOT;  Surgeon: Erle Crocker, MD;  Location: [place];  Service: Orthopedics;  Laterality: Right;   MOUTH SURGERY     ROBOTIC ASSITED PARTIAL NEPHRECTOMY Right 07/28/2019   Procedure: XI ROBOTIC ASSITED PARTIAL NEPHRECTOMY;  Surgeon: Cleon Gustin, MD;  [facility];  Service: Urology;  Laterality: Right;  3 HRS   TRANSMETATARSAL AMPUTATION Right 04/12/2021   Procedure: TRANSMETATARSAL AMPUTATION;  Surgeon: Erle Crocker, MD;  Location: [place];  Service: Orthopedics;  Laterality: Right;   Social History:  reports that she has never smoked. She has never used smokeless tobacco. She reports current alcohol use of about 1.0 standard drink of alcohol per week. She reports that she does not use  drugs.  Family / Support Systems Marital Status: Married Patient Roles: Spouse Spouse/Significant Other: Scarlette Shorts Other Supports: Varney Biles (sister) Anticipated Caregiver: Spouse Ability/Limitations of Caregiver: taking FMLA Caregiver Availability: 24/7 Family Dynamics: some assistance from spouse and sister  Social History Preferred language: English Religion: None Read: Yes Write: Yes Employment Status: Employed Public relations account executive Issues: n/a Guardian/Conservator: n/a   Abuse/Neglect Abuse/Neglect Assessment Can Be Completed: Yes Physical Abuse: Denies Verbal Abuse: Denies Sexual Abuse: Denies Exploitation of patient/patient's resources: Denies Self-Neglect: Denies  Emotional Status Recent Psychosocial Issues: coping Psychiatric History: n/a Substance Abuse History: n/a  Patient / Family Perceptions, Expectations & Goals Pt/Family understanding of illness & functional limitations: yes Premorbid pt/family roles/activities: L side weakness (previous CVA) Patient independent and driving Anticipated changes in roles/activities/participation: spouse able to assisit with task Pt/family expectations/goals: supervision to [employer]: None Premorbid Home Care/DME Agencies: None Transportation available at discharge: family able to transport Resource referrals recommended: Neuropsychology (coping)  Discharge Planning Living Arrangements: Spouse/significant other (Plans on taking FMLA) Support Systems: Spouse/significant other, Other relatives Type of Residence: Private residence (1 level home , 3 steps) Insurance Resources: Multimedia programmer (specify) Scientist, clinical (histocompatibility and immunogenetics)) Financial Resources: Employment Living Expenses: Rent Does the patient have any problems obtaining your medications?: No Home Management: Independent Patient/Family Preliminary Plans: Spouse able to assist with medication and money management if needed Care Coordinator  Barriers to Discharge: Insurance for SNF coverage Care Coordinator Anticipated Follow Up Needs: HH/OP Expected length of stay: 2 weeks  Clinical Impression SW met with patient. Very pleasant.  Introduced self, explained role and addressed questions and concerns. Plans to discharge home with spouse to care (plans to take FMLA). Patient requesting disability information. No additional questions or concerns, sw will continue to follow up  [name] 04/18/2021, 12:57 PM

## 2021-04-18 NOTE — Progress Notes (Signed)
[facility] Individual Statement of Services  Patient Name:  Sherri Dixon  Date:  04/18/2021  Welcome to the [facility].  Our goal is to provide you with an individualized program based on your diagnosis and situation, designed to meet your specific needs.  With this comprehensive rehabilitation program, you will be expected to participate in at least 3 hours of rehabilitation therapies Monday-Friday, with modified therapy programming on the weekends.  Your rehabilitation program will include the following services:  Physical Therapy (PT), Occupational Therapy (OT), Speech Therapy (ST), 24 hour per day rehabilitation nursing, Therapeutic Recreaction (TR), Neuropsychology, Care Coordinator, Rehabilitation Medicine, Nutrition Services, Pharmacy Services, and Other  Weekly team conferences will be held on Tuesdays to discuss your progress.  Your Inpatient Rehabilitation Care Coordinator will talk with you frequently to get your input and to update you on team discussions.  Team conferences with you and your family in attendance may also be held.  Expected length of stay:  2 Weeks   Overall anticipated outcome: Supervision to Min A  Depending on your progress and recovery, your program may change. Your Inpatient Rehabilitation Care Coordinator will coordinate services and will keep you informed of any changes. Your Inpatient Rehabilitation Care Coordinator's name and contact numbers are listed  below.  The following services may also be recommended but are not provided by the [facility]:   [place] will be made to provide these services after discharge if needed.  Arrangements include referral to agencies that provide these services.  Your insurance has been verified to be:  Airline [employer] NAP Your primary doctor is:   Fonnie Jarvis, NP  Pertinent information  will be shared with your doctor and your insurance company.  Inpatient Rehabilitation Care Coordinator:  Erlene Quan, [place] or [phone]  Information discussed with and copy given to patient by: [name], 04/18/2021, 11:45 AM

## 2021-04-18 NOTE — Evaluation (Signed)
Physical Therapy Assessment and Plan  Patient Details  Name: Sherri Dixon MRN: [id number] Date of Birth: 1981/08/25  PT Diagnosis: Abnormal posture, Abnormality of gait, Coordination disorder, Difficulty walking, Edema, Hemiparesis non-dominant, Muscle weakness, and Pain in RLE Rehab Potential: Good ELOS: 14-18 days   Today's Date: 04/18/2021 PT Individual Time: 1100-1158 PT Individual Time Calculation (min): 58 min    Hospital Problem: Principal Problem:   Partial nontraumatic amputation of right foot ([place]) Active Problems:   S/P transmetatarsal amputation of foot, right ([place])   Past Medical History:  Past Medical History:  Diagnosis Date   Anemia    Asthma    CHF (congestive heart failure) ([place])    Pt reports that father has CHF, not her   Diabetes mellitus without complication ([place])    Dyslipidemia    GERD (gastroesophageal reflux disease)    Pulmonary embolism ([place])    Stroke [facility])    Past Surgical History:  Past Surgical History:  Procedure Laterality Date   APPLICATION OF WOUND VAC Right 04/12/2021   Procedure: APPLICATION OF WOUND VAC;  Surgeon: Erle Crocker, MD;  Location: [place];  Service: Orthopedics;  Laterality: Right;   I & D EXTREMITY Right 04/06/2021   Procedure: IRRIGATION AND DEBRIDEMENT TOE WITH WOUND VAC PLACEMENT;  Surgeon: Erle Crocker, MD;  Location: [place];  Service: Orthopedics;  Laterality: Right;   I & D EXTREMITY Right 04/12/2021   Procedure: IRRIGATION AND DEBRIDEMENT RIGHT FOOT;  Surgeon: Erle Crocker, MD;  Location: [place];  Service: Orthopedics;  Laterality: Right;   MOUTH SURGERY     ROBOTIC ASSITED PARTIAL NEPHRECTOMY Right 07/28/2019   Procedure: XI ROBOTIC ASSITED PARTIAL NEPHRECTOMY;  Surgeon: Cleon Gustin, MD;  [facility];  Service: Urology;  Laterality: Right;  3 HRS   TRANSMETATARSAL AMPUTATION Right 04/12/2021   Procedure: TRANSMETATARSAL AMPUTATION;  Surgeon: Erle Crocker, MD;  Location: [place];   Service: Orthopedics;  Laterality: Right;    Assessment & Plan Clinical Impression: Patient is a 40 y.o. year old female with history of HTN, T2DM, PE- on Xarelto, super morbid obesity- BMI 62, microcytic anemia; who was admitted on 04/05/21 with right foot pain with swelling and drainage after sustaining an injury a week prior to admission.  She was found to have SIRS with leukocytosis and tachycardia as well as purulent drainage with x-ray showing tissue emphysema concerning for gas/necrotizing infection.  She was started on broad-spectrum antibiotic and underwent I&D of multiple bursal cavity with wound VAC application by Dr. Lucia Gaskins.  Amputation recommended and Dr. Graylon Good who was consulted for input agreed with amputation due to significant burden of infection.  Vancomycin changed to daptomycin due to AKI.  Hospital course also significant for issues with urinary retention, electrolyte abnormality, microcytic anemia due to heavy manses as well as wound cultures positive for Proteus mirabilis and Streptococcus gallolyticus. She underwent right foot transmetatarsal amputation on 07/15 and to be NWB with surgical VAC in place.   AKI has resolved and ID recommends IV Ceftriaxone 2 g and IV Vanc for 6 total weeks with end date of 05/24/21. She had recurrent drop in H/H to 6.6 on 07/16 and was transfused with 2 units PRBC. WBC has continued to fluctuate with nadir 24.7 on 07/16 but is trending down to Therapy has been ongoing and patient continues to be limited by WB status, body habitus and weakness with debility. CIR recommended due to functional decline. ,  Patient currently requires total with mobility secondary  to muscle weakness, decreased cardiorespiratoy endurance, abnormal tone, unbalanced muscle activation, decreased coordination, and decreased motor planning, and decreased sitting balance, decreased standing balance, decreased postural control, hemiplegia, decreased balance strategies, and difficulty  maintaining precautions.  Prior to hospitalization, patient was independent  with mobility and lived with Significant other (lives with husband and step daughter) in a House home.  Home access is 3+1Stairs to enter.  Patient will benefit from skilled PT intervention to maximize safe functional mobility, minimize fall risk, and decrease caregiver burden for planned discharge home with 24 hour supervision.  Anticipate patient will benefit from follow up [place] at discharge.  PT - End of Session Activity Tolerance: Tolerates 30+ min activity with multiple rests Endurance Deficit: Yes Endurance Deficit Description: required rest breaks PT Assessment Rehab Potential (ACUTE/IP ONLY): Good PT Barriers to Discharge: [place] home environment;Home environment access/layout;Incontinence;Wound Care;Weight;Weight bearing restrictions PT Barriers to Discharge Comments: 3 STE with 2 rails, incontinent, pain, and WB restrictions PT Patient demonstrates impairments in the following area(s): Balance;Edema;Endurance;Motor;Nutrition;Pain;Skin Integrity PT Transfers Functional Problem(s): Bed Mobility;Bed to Chair;Car;Furniture PT Locomotion Functional Problem(s): Ambulation;Wheelchair Mobility;Stairs PT Plan PT Intensity: Minimum of 1-2 x/day ,45 to 90 minutes PT Frequency: 5 out of 7 days PT Duration Estimated Length of Stay: 14-18 days PT Treatment/Interventions: Ambulation/gait training;Discharge planning;Functional mobility training;Balance/vestibular training;Disease management/prevention;Neuromuscular re-education;Skin care/wound management;Therapeutic Exercise;Wheelchair propulsion/positioning;Cognitive remediation/compensation;DME/adaptive equipment instruction;Pain management;Splinting/orthotics;UE/LE Strength taining/ROM;Community reintegration;Functional electrical stimulation;Patient/family education;Stair training;UE/LE Coordination activities PT Transfers Anticipated Outcome(s): supervision with  LRAD PT Locomotion Anticipated Outcome(s): CGA with LRAD PT Recommendation Follow Up Recommendations: Home health PT Patient destination: Home Equipment Recommended: To be determined   PT Evaluation Precautions/Restrictions Precautions Precautions: Fall Restrictions Weight Bearing Restrictions: Yes RLE Weight Bearing: Non weight bearing Other Position/Activity Restrictions: residual L side weakness - from old CVA Home Living/Prior Functioning Home Living Living Arrangements: Spouse/significant other (Plans on taking FMLA) Available Help at Discharge: Family;Available 24 hours/day (husband is taking off for 3 months) Type of Home: House Home Access: Stairs to enter [employer] of Steps: 3+1 Entrance Stairs-Rails: Right;Left;Can reach both Home Layout: One level Bathroom Shower/Tub: Chiropodist: Standard Bathroom Accessibility: Yes  Lives With: Significant other (lives with husband and step daughter) Prior Function Level of Independence: Needs assistance with ADLs;Independent with gait;Independent with transfers  Able to Take Stairs?: No Driving: Yes Vocation: Unemployed Vocation Requirements: working up until her stroke. Comments: had stroke 3 weeks ago. Cognition Overall Cognitive Status: Within Functional Limits for tasks assessed Arousal/Alertness: Awake/alert Orientation Level: Oriented X4 Memory: Appears intact Awareness: Appears intact Problem Solving: Appears intact Safety/Judgment: Appears intact Sensation Sensation Light Touch: Appears Intact Proprioception: Appears Intact Coordination Gross Motor Movements are Fluid and Coordinated: No Fine Motor Movements are Fluid and Coordinated: Yes Coordination and Movement Description: grossly uncoordinated due to generalized weakness/deconditioning, pain, fatigue, and body habitus Finger Nose Finger Test: [facility] bilaterally Heel Shin Test: unable to lift RLE, unable to perform on RLE but  able to lift leg Motor  Motor Motor: Abnormal postural alignment and control;Hemiplegia Motor - Skilled Clinical Observations: grossly uncoordinated due to mild L hemi, generalized weakness/deconditioning, pain, fatigue, and body habitus  Trunk/Postural Assessment  Cervical Assessment Cervical Assessment: Exceptions to [facility] (forward head) Thoracic Assessment Thoracic Assessment: Exceptions to [facility] (rounded shoulders) Lumbar Assessment Lumbar Assessment: Exceptions to [facility] (posterior pelvic tilt) Postural Control Postural Control: Deficits on evaluation  Balance Balance Balance Assessed: Yes Static Sitting Balance Static Sitting - Balance Support: Feet supported;Bilateral upper extremity supported Static Sitting - Level of Assistance: 5: Stand by  assistance (supervision) Dynamic Sitting Balance Dynamic Sitting - Balance Support: Bilateral upper extremity supported;Feet supported Dynamic Sitting - Level of Assistance: 5: Stand by assistance (supervision) Extremity Assessment  RLE Assessment RLE Assessment: Exceptions to [facility] General Strength Comments: grossly generalized to 3+/5 (except knee extension 4-/5) LLE Assessment LLE Assessment: Exceptions to [facility] General Strength Comments: grossly generalized to 3+/5  Care Tool Care Tool Bed Mobility Roll left and right activity   Roll left and right assist level: Supervision/Verbal cueing    Sit to lying activity        Lying to sitting edge of bed activity   Lying to sitting edge of bed assist level: Supervision/Verbal cueing     Care Tool Transfers Sit to stand transfer Sit to stand activity did not occur: Safety/medical concerns (fatigue, weakness, dizziness)      Chair/bed transfer   Chair/bed transfer assist level: 2 Helpers (slideboard)     Psychologist, counselling transfer activity did not occur: Safety/medical concerns (fatigue, weakness, dizziness)        Care Tool Locomotion Ambulation Ambulation  activity did not occur: Safety/medical concerns (fatigue, weakness, dizziness)        Walk 10 feet activity Walk 10 feet activity did not occur: Safety/medical concerns (fatigue, weakness, dizziness)       Walk 50 feet with 2 turns activity Walk 50 feet with 2 turns activity did not occur: Safety/medical concerns (fatigue, weakness, dizziness)      Walk 150 feet activity Walk 150 feet activity did not occur: Safety/medical concerns (fatigue, weakness, dizziness)      Walk 10 feet on uneven surfaces activity Walk 10 feet on uneven surfaces activity did not occur: Safety/medical concerns (fatigue, weakness, dizziness)      Stairs Stair activity did not occur: Safety/medical concerns (fatigue, weakness, dizziness)        Walk up/down 1 step activity Walk up/down 1 step or curb (drop down) activity did not occur: Safety/medical concerns (fatigue, weakness, dizziness)     Walk up/down 4 steps activity did not occuR: Safety/medical concerns (fatigue, weakness, dizziness)  Walk up/down 4 steps activity      Walk up/down 12 steps activity Walk up/down 12 steps activity did not occur: Safety/medical concerns (fatigue, weakness, dizziness)      Pick up small objects from floor Pick up small object from the floor (from standing position) activity did not occur: Safety/medical concerns (fatigue, weakness, dizziness)      Wheelchair Will patient use wheelchair at discharge?: Yes Type of Wheelchair: Manual Wheelchair activity did not occur: Safety/medical concerns (fatigue, weakness, dizziness)      Wheel 50 feet with 2 turns activity Wheelchair 50 feet with 2 turns activity did not occur: Safety/medical concerns (fatigue, weakness, dizziness)    Wheel 150 feet activity Wheelchair 150 feet activity did not occur: Safety/medical concerns (fatigue, weakness, dizziness)      Refer to Care Plan for Long Term Goals  SHORT TERM GOAL WEEK 1 PT Short Term Goal 1 (Week 1): pt will transfer  bed<>chair with LRAD and max of 1 PT Short Term Goal 2 (Week 1): pt will transfer sit<>stand with max A of 1 PT Short Term Goal 3 (Week 1): pt will perform WC mobility 20f with supervision  Recommendations for other services: None   Skilled Therapeutic Intervention Evaluation completed (see details above and below) with education on PT POC and goals and individual treatment initiated with focus on functional mobility/transfers, generalized strengthening,  dynamic sitting balance, and improved activity tolerance. Received pt supine in bed, pt educated on PT evaluation, CIR policies, and therapy schedule and agreeable. Pt reported pain 6/10 in R ankle (premedicated) and reported urge to have BM. Rolled L/R to place bedpan but pt ultimately unsuccessful. Located 22x18 WC and R elevating legrest as well as bariatric slideboard for pt. Rolled L/R with supervision and use of bedrails and required total A to don brief. Donned pants in supine with total A due to inability to lift LLE. Pt transferred supine<>sitting EOB with supervision and use of bedrails and doffed gown and donned bra and pull over shirt with supervision. Pt then transferred bed<>WC via slideboard to R with max A +2 to stabilize WC with therapist holding RLE to maintain RLE NWB precautions -cues for sequencing. Pt required max A to scoot hip back in WC. Pt reported increase in dizziness at end of session as she "hasn't been up very much". Concluded session with pt sitting in WC, needs within reach, and chair pad alarm on. Safety plan updated and NT and RN aware of pt's transfer status.   Mobility Bed Mobility Bed Mobility: Rolling Right;Rolling Left;Supine to Sit Rolling Right: Supervision/verbal cueing Rolling Left: Supervision/Verbal cueing Supine to Sit: Supervision/Verbal cueing Transfers Transfers: Lateral/Scoot Transfers Lateral/Scoot Transfers: 2 Press photographer (Assistive device): Other (Comment) (slideboard) Locomotion   Gait Ambulation: No Gait Gait: No Stairs / Additional Locomotion Stairs: No Wheelchair Mobility Wheelchair Mobility: No   Discharge Criteria: Patient will be discharged from PT if patient refuses treatment 3 consecutive times without medical reason, if treatment goals not met, if there is a change in medical status, if patient makes no progress towards goals or if patient is discharged from hospital.  The above assessment, treatment plan, treatment alternatives and goals were discussed and mutually agreed upon: by patient  Alfonse Alpers PT, DPT  04/18/2021, 12:14 PM

## 2021-04-19 DIAGNOSIS — E876 Hypokalemia: Secondary | ICD-10-CM

## 2021-04-19 DIAGNOSIS — Z89431 Acquired absence of right foot: Secondary | ICD-10-CM | POA: Diagnosis not present

## 2021-04-19 DIAGNOSIS — E43 Unspecified severe protein-calorie malnutrition: Secondary | ICD-10-CM | POA: Diagnosis not present

## 2021-04-19 DIAGNOSIS — T8130XA Disruption of wound, unspecified, initial encounter: Secondary | ICD-10-CM

## 2021-04-19 LAB — GLUCOSE, CAPILLARY
Glucose-Capillary: 117 mg/dL — ABNORMAL HIGH (ref 70–99)
Glucose-Capillary: 127 mg/dL — ABNORMAL HIGH (ref 70–99)
Glucose-Capillary: 81 mg/dL (ref 70–99)
Glucose-Capillary: 131 mg/dL — ABNORMAL HIGH (ref 70–99)

## 2021-04-19 LAB — BASIC METABOLIC PANEL
Anion gap: 5 (ref 5–15)
BUN: 9 mg/dL (ref 6–20)
CO2: 32 mmol/L (ref 22–32)
Calcium: 7.7 mg/dL — ABNORMAL LOW (ref 8.9–10.3)
Chloride: 101 mmol/L (ref 98–111)
Creatinine, Ser: 1.1 mg/dL — ABNORMAL HIGH (ref 0.44–1.00)
GFR, Estimated: >60 mL/min (ref >60)
Glucose, Bld: 149 mg/dL — ABNORMAL HIGH (ref 70–99)
Potassium: 3.3 mmol/L — ABNORMAL LOW (ref 3.5–5.1)
Sodium: 138 mmol/L (ref 135–145)

## 2021-04-19 LAB — MAGNESIUM: Magnesium: 1.6 mg/dL — ABNORMAL LOW (ref 1.7–2.4)

## 2021-04-19 MED ORDER — MAGNESIUM SULFATE 2 GM/50ML IV SOLN
2.0000 g | Freq: Once | INTRAVENOUS | Status: AC
  Administered 2021-04-19: 2 g via INTRAVENOUS
  Filled 2021-04-19: qty 50

## 2021-04-19 MED ORDER — POTASSIUM CHLORIDE 20 MEQ PO PACK
40.0000 meq | PACK | Freq: Every day | ORAL | Status: DC
  Administered 2021-04-20: 40 meq via ORAL
  Filled 2021-04-19: qty 2

## 2021-04-19 MED ORDER — MAGNESIUM GLUCONATE 500 MG PO TABS
500.0000 mg | ORAL_TABLET | Freq: Every day | ORAL | Status: DC
  Administered 2021-04-19 – 2021-04-26 (×8): 500 mg via ORAL
  Filled 2021-04-19 (×9): qty 1

## 2021-04-19 NOTE — Progress Notes (Signed)
Physical Therapy Session Note  Patient Details  Name: Sherri Dixon MRN: [id number] Date of Birth: October 02, 1980  Today's Date: 04/19/2021 PT Individual Time: 0932-1000 PT Individual Time Calculation (min): 28 min  Missed time 32' Short Term Goals: Week 1:  PT Short Term Goal 1 (Week 1): pt will transfer bed<>chair with LRAD and max of 1 PT Short Term Goal 2 (Week 1): pt will transfer sit<>stand with max A of 1 PT Short Term Goal 3 (Week 1): pt will perform WC mobility 29f with supervision  Skilled Therapeutic Interventions/Progress Updates: Pt presents supine in bed and waiting for RN to re-dress incision.  RN giving pain meds and re-dressing wound. Pt also receiving care from IV nurse.  Pt agreeable to therapy although states "loopy" from pain meds.  LLE re-wrapped w/ 2 ace wraps toes to knee.  Pt required max A for threading pants over feet and up to knees, Pt required mod A to attain hooklying position to pull pants to hips and then mod A to roll side to side for pulling pants over hips as much as possible w/ total A, although able to maintain side-lying w/ min A.  Pt transferred sup to sit w/ supervision, but educated on log roll technique to improve performance w/ decreased breath holding.  Pt able to scoot to EOB and performed seated AP, LAQ to BLEs, w/o UE support.  Pt BP taken while seated 2/2 c/o "lightheadedness at 145/82.  Pt states decreased symptoms after performing There ex and BP at 137/80.  Pt able to [facility] to [facility] on floor and maintaining NWB RLE w/ supervision and verbal cues for increased clearance.  Pt transferred sit to supine w/ mod A for LEs.   Pt able to use [facility]es to pull self to [facility]/ assist of 2 w/ Chux pad.  Bed alarm on and all needs in reach.      Therapy Documentation Precautions:  Precautions Precautions: Fall Restrictions Weight Bearing Restrictions: Yes RLE Weight Bearing: Non weight bearing Other Position/Activity Restrictions: residual L side weakness -  from old CVA General: PT Missed Treatment Reason: Wound care;Other (Comment) (IV nurse.) Vital Signs:   Pain: 7/10, being given pain meds at initiation of therapy. Pain Assessment Pain Scale: 0-10 Pain Score: 0-No pain Faces Pain Scale: No hurt PAINAD (Pain Assessment in Advanced Dementia) Breathing: normal Negative Vocalization: none Facial Expression: smiling or inexpressive Body Language: relaxed Consolability: no need to console PAINAD Score: 0    Therapy/Group: Individual Therapy  JLadoris Gene7/22/2022, 12:10 PM

## 2021-04-19 NOTE — IPOC Note (Signed)
Overall Plan of Care [facility]) Patient Details Name: Sherri Dixon MRN: [id number] DOB: [birth year]  Admitting Diagnosis: Partial nontraumatic amputation of right foot [facility])  Hospital Problems: Principal Problem:   Partial nontraumatic amputation of right foot ([place]) Active Problems:   S/P transmetatarsal amputation of foot, right ([place])     Functional Problem List: Nursing Edema, Endurance, Medication Management, Pain, Safety, Skin Integrity  PT Balance, Edema, Endurance, Motor, Nutrition, Pain, Skin Integrity  OT Balance, Edema, Endurance, Motor, Skin Integrity  SLP    TR         Basic ADL's: OT Grooming, Bathing, Dressing, Toileting     Advanced  ADL's: OT Simple Meal Preparation     Transfers: PT Bed Mobility, Bed to Chair, Car, Manufacturing systems engineer, Metallurgist: PT Ambulation, Emergency planning/management officer, Stairs     Additional Impairments: OT    SLP        TR      Anticipated Outcomes Item Anticipated Outcome  Self Feeding independent  Swallowing      Basic self-care  min A  Toileting  min A   Bathroom Transfers min A  Bowel/Bladder  N/A  Transfers  supervision with LRAD  Locomotion  CGA with LRAD  Communication     Cognition     Pain  < 3  Safety/Judgment  Min assist and no falls   Therapy Plan: PT Intensity: Minimum of 1-2 x/day ,45 to 90 minutes PT Frequency: 5 out of 7 days PT Duration Estimated Length of Stay: 14-18 days OT Intensity: Minimum of 1-2 x/day, 45 to 90 minutes OT Frequency: 5 out of 7 days OT Duration/Estimated Length of Stay: 3 weeks     Due to the current state of emergency, patients may not be receiving their 3-hours of Medicare-mandated therapy.   Team Interventions: Nursing Interventions Patient/Family Education, Disease Management/Prevention, Pain Management, Medication Management, Skin Care/Wound Management, Discharge Planning, Psychosocial Support  PT interventions Ambulation/gait training, Discharge  planning, Functional mobility training, Balance/vestibular training, Disease management/prevention, Neuromuscular re-education, Skin care/wound management, Therapeutic Exercise, Wheelchair propulsion/positioning, Cognitive remediation/compensation, DME/adaptive equipment instruction, Pain management, Splinting/orthotics, UE/LE Strength taining/ROM, Community reintegration, Technical sales engineer stimulation, Patient/family education, IT trainer, UE/LE Coordination activities  OT Interventions Discharge planning, DME/adaptive equipment instruction, Functional mobility training, Training and development officer, Patient/family education, Self Care/advanced ADL retraining, Skin care/wound managment, Therapeutic Activities, Therapeutic Exercise, UE/LE Strength taining/ROM, Wheelchair propulsion/positioning  SLP Interventions    TR Interventions    SW/CM Interventions Discharge Planning, Psychosocial Support, Patient/Family Education, Disease Management/Prevention   Barriers to Discharge MD  Medical stability  Nursing Decreased caregiver support, Home environment access/layout, IV antibiotics, Wound Care, Lack of/limited family support, Weight, Weight bearing restrictions, Medication compliance, Behavior 1 level home, lives with spouse. 3+1 stairs to enter, right and left rail, can reach both. Spouse can take FMLA. Spouse is aware she will be on home IV antibiotics. Spouse will be available 24/7.  PT Inaccessible home environment, Home environment access/layout, Incontinence, Wound Care, Weight, Weight bearing restrictions 3 STE with 2 rails, incontinent, pain, and WB restrictions  OT      SLP      SW Insurance for SNF coverage     Team Discharge Planning: Destination: PT-Home ,OT- Home , SLP-  Projected Follow-up: PT-Home health PT, OT-  Home health OT, SLP-  Projected Equipment Needs: PT-To be determined, OT- 3 in 1 bedside comode, Sliding board (bariatric drop arm commode likely needed), SLP-   Equipment Details: PT- , OT-  Patient/family involved in  discharge planning: PT- Patient,  OT-Patient, SLP-   MD ELOS: 15-20 days Medical Rehab Prognosis:  Good Assessment: The patient has been admitted for CIR therapies with the diagnosis of right TMA and associated complications. The team will be addressing functional mobility, strength, stamina, balance, safety, adaptive techniques and equipment, self-care, bowel and bladder mgt, patient and caregiver education, wb precautions, pain mgt, wound care, community reentry. Goals have been set at min assist with self-care and supervision to contact guard assist with mobility.   Due to the current state of emergency, patients may not be receiving their 3 hours per day of Medicare-mandated therapy.    Meredith Staggers, MD, FAAPMR     See Team Conference Notes for weekly updates to the plan of care

## 2021-04-19 NOTE — Progress Notes (Addendum)
PROGRESS NOTE   Subjective/Complaints: Had a reasonable night. Right foot still tender. Therapies went without any problems yesterday  ROS: Patient denies fever, rash, sore throat, blurred vision, nausea, vomiting, diarrhea, cough, shortness of breath or chest pain,  headache, or mood change.    Objective:   No results found. Recent Labs    04/17/21 0441 04/18/21 0713  WBC 15.4* 12.9*  HGB 8.5* 8.2*  HCT 26.5* 25.4*  PLT 519* 518*   Recent Labs    04/18/21 0713 04/19/21 0101  NA 137 138  K 2.8* 3.3*  CL 101 101  CO2 32 32  GLUCOSE 137* 149*  BUN 10 9  CREATININE 1.09* 1.10*  CALCIUM 7.7* 7.7*    Intake/Output Summary (Last 24 hours) at 04/19/2021 1231 Last data filed at 04/19/2021 0700 Gross per 24 hour  Intake 540 ml  Output 800 ml  Net -260 ml        Physical Exam: Vital Signs Blood pressure 127/70, pulse 76, temperature 98.3 F (36.8 C), temperature source Oral, resp. rate 20, height _0  (1.575 m), weight 132 kg, last menstrual period 04/05/2021, SpO2 98 %. Constitutional: No distress . Vital signs reviewed. HEENT: EOMI, oral membranes moist Neck: supple Cardiovascular: RRR without murmur. No JVD    Respiratory/Chest: CTA Bilaterally without wheezes or rales. Normal effort    GI/Abdomen: BS +, non-tender, non-distended Ext: no clubbing, cyanosis, or edema Psych: pleasant and cooperative  Skin: right foot amp site with serosanguinous drainage. Incision a little too moist from telfa. Neurologic: Cranial nerves II through XII intact, motor strength is 5/5 in right and 4+ Left deltoid, bicep, tricep, grip, hip flexor, knee extensors, ankle dorsiflexor and plantar flexor Sensory: decreased LT bilateral distal LE   Musculoskeletal: Full range of motion in all 4 extremities.       Assessment/Plan: 1. Functional deficits which require 3+ hours per day of interdisciplinary therapy in a comprehensive  inpatient rehab setting. Physiatrist is providing close team supervision and 24 hour management of active medical problems listed below. Physiatrist and rehab team continue to assess barriers to discharge/monitor patient progress toward functional and medical goals  Care Tool:  Bathing              Bathing assist       Upper Body Dressing/Undressing Upper body dressing   What is the patient wearing?: Pull over shirt    Upper body assist Assist Level: Supervision/Verbal cueing    Lower Body Dressing/Undressing Lower body dressing      What is the patient wearing?: Incontinence brief     Lower body assist Assist for lower body dressing: Moderate Assistance - Patient 50 - 74%     Toileting Toileting    Toileting assist Assist for toileting: Dependent - Patient 0%     Transfers Chair/bed transfer  Transfers assist     Chair/bed transfer assist level: Dependent - mechanical lift     Locomotion Ambulation   Ambulation assist   Ambulation activity did not occur: Safety/medical concerns (fatigue, weakness, dizziness)          Walk 10 feet activity   Assist  Walk 10 feet activity did not occur:  Safety/medical concerns (fatigue, weakness, dizziness)        Walk 50 feet activity   Assist Walk 50 feet with 2 turns activity did not occur: Safety/medical concerns (fatigue, weakness, dizziness)         Walk 150 feet activity   Assist Walk 150 feet activity did not occur: Safety/medical concerns (fatigue, weakness, dizziness)         Walk 10 feet on uneven surface  activity   Assist Walk 10 feet on uneven surfaces activity did not occur: Safety/medical concerns (fatigue, weakness, dizziness)         Wheelchair     Assist Will patient use wheelchair at discharge?: Yes Type of Wheelchair: Manual Wheelchair activity did not occur: Safety/medical concerns (fatigue, weakness, dizziness)         Wheelchair 50 feet with 2 turns  activity    Assist    Wheelchair 50 feet with 2 turns activity did not occur: Safety/medical concerns (fatigue, weakness, dizziness)       Wheelchair 150 feet activity     Assist  Wheelchair 150 feet activity did not occur: Safety/medical concerns (fatigue, weakness, dizziness)       Blood pressure 127/70, pulse 76, temperature 98.3 F (36.8 C), temperature source Oral, resp. rate 20, height _0  (1.575 m), weight 132 kg, last menstrual period 04/05/2021, SpO2 98 %.    Medical Problem List and Plan: 1.  Impaired self care and mobility  secondary to Right trans met amp with NWB x 6 wks             -patient may  shower with RLE wrapped             -ELOS/Goals: 14-17d , minA goals  -Continue CIR therapies including PT, OT  2.  Antithrombotics: -DVT/anticoagulation:  Pharmaceutical: Xarelto             -antiplatelet therapy: N/AA 3. Pain Management: Denies pain.  4. Mood: LCSW to follow for evaluation and support             -antipsychotic agents: N/AA 5. Neuropsych: This patient is capable of making decisions on her own behalf. 6. Skin/Wound Care:   oil emersion dressing, 4x4 gauze and kerlix to wound daily. Spoke with nursing 7. Fluids/Electrolytes/Nutrition: Monitor intake/output.  Check c-Met in AM. 8.  Right foot infection: Continue rocephin and flagyl for 6 weeks with end date 08/26. 9.  T2DM: Hemoglobin A1c-8.7.   --Will monitor blood sugars AC/at bedtime basis. CBG (last 3)  Recent Labs (last 2 labs)        Recent Labs    04/16/21 2213 04/17/21 0730 04/17/21 1236  GLUCAP 137* 110* 96     -7/22 sugars under reasonable control at present 10.  Microcytic anemia: Monitor H&H with serial checks.  Repeat CBC in AM. 11.  Acute kidney injury: Likely in setting of SIRS/infection and is slowly resolving             -- Monitor renal status with routine checks. 12.  History of severe malnutrition: Albumin 2.1 in May.  1.0 7/21  -no albumin on bmet--recheck Monday  along with prealbumin            -protein supplement, RD f/u 13.  Super morbid obesity: BMI 54--educate patient on weight loss to promote health and mobility.             --  consult dietitian for dietary education. 14.  Chronic diastolic CHF: Monitor for signs of overload.  Check weights daily.   --Continue metoprolol, hydralazine, furosemide and Crestor. 15.  HTN: Reports of dizziness with therapy.  Monitor for systolic changes. 16. Vitamin D deficiency: start ergocalciferol 50,000U weekly for 7 weeks 17. Hypokalemia: supplement 44mq BID 2.8-->3.3 7/22, continue 429m daily 18. Hypomagnesemia: supplemented 2g IV yesterday, repeat 7/22 for 1.6 -recheck labs Saturday -oral supplement .   LOS: 2 days A FACE TO FACE EVALUATION WAS PERFORMED  ZaMeredith Staggers/22/2022, 12:31 PM

## 2021-04-19 NOTE — Progress Notes (Signed)
Physical Therapy Session Note  Patient Details  Name: Sherri Dixon MRN: [id number] Date of Birth: July 12, 1981  Today's Date: 04/19/2021 PT Individual Time: 1535-1630 PT Individual Time Calculation (min): 55 min   Short Term Goals: Week 1:  PT Short Term Goal 1 (Week 1): pt will transfer bed<>chair with LRAD and max of 1 PT Short Term Goal 2 (Week 1): pt will transfer sit<>stand with max A of 1 PT Short Term Goal 3 (Week 1): pt will perform WC mobility 29f with supervision  Skilled Therapeutic Interventions/Progress Updates:    Patient in supine and reports not feeling well.  Had Magnesium infusion that makes her feel "loopy" and reports got sick earlier on her lunch.  Sister to bring her other food.  Patient supine for assist to don scrub pants with max A to thread both legs in and pull up with pt rolling with S.  Performed supine to sit with min A and seated to scoot to EOB with S and increased time.  Had obtained large w/c to bring to room, but noted likely too big.  Patient felt worse sitting up so vitals checked and RN aware.  NT in to check CBG.  Patient did perform slide board transfer to w/c with mod A and increased time for safety to place board and with cues throughout as pt placing weight on R foot during sitting and transfer.  Patient transferred back to bed with mod A and more time due to going up hill.  Noted after transfer blood on floor and soaked through dressing on R foot.  RN made aware and pt to supine with mod A to lift legs and placed R foot on 2 pillows.  In supine pt performed AAROM SLR on R, L single leg bridge w/ 3 sec hold, L hooklying march with A x 10 and hip adductor squeezes with 3 sec hold x 10.  Patient positioned for comfort as sister arrived to bring soup for her to try and eat.  Left with call bell in reach and bed alarm active. Obtained 24x18 w/c and left in room.  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

## 2021-04-20 DIAGNOSIS — Z89431 Acquired absence of right foot: Secondary | ICD-10-CM | POA: Diagnosis not present

## 2021-04-20 LAB — BASIC METABOLIC PANEL
Anion gap: 3 — ABNORMAL LOW (ref 5–15)
BUN: 8 mg/dL (ref 6–20)
CO2: 33 mmol/L — ABNORMAL HIGH (ref 22–32)
Calcium: 7.7 mg/dL — ABNORMAL LOW (ref 8.9–10.3)
Chloride: 101 mmol/L (ref 98–111)
Creatinine, Ser: 1.08 mg/dL — ABNORMAL HIGH (ref 0.44–1.00)
GFR, Estimated: >60 mL/min (ref >60)
Glucose, Bld: 135 mg/dL — ABNORMAL HIGH (ref 70–99)
Potassium: 3.1 mmol/L — ABNORMAL LOW (ref 3.5–5.1)
Sodium: 137 mmol/L (ref 135–145)

## 2021-04-20 LAB — MAGNESIUM: Magnesium: 1.9 mg/dL (ref 1.7–2.4)

## 2021-04-20 LAB — GLUCOSE, CAPILLARY
Glucose-Capillary: 128 mg/dL — ABNORMAL HIGH (ref 70–99)
Glucose-Capillary: 150 mg/dL — ABNORMAL HIGH (ref 70–99)
Glucose-Capillary: 122 mg/dL — ABNORMAL HIGH (ref 70–99)

## 2021-04-20 MED ORDER — POTASSIUM CHLORIDE CRYS ER 20 MEQ PO TBCR
30.0000 meq | EXTENDED_RELEASE_TABLET | Freq: Two times a day (BID) | ORAL | Status: DC
  Administered 2021-04-21 – 2021-04-26 (×9): 30 meq via ORAL
  Filled 2021-04-20 (×11): qty 1

## 2021-04-20 MED ORDER — POTASSIUM CHLORIDE 20 MEQ PO PACK
40.0000 meq | PACK | Freq: Three times a day (TID) | ORAL | Status: AC
  Administered 2021-04-20 – 2021-04-21 (×3): 40 meq via ORAL
  Filled 2021-04-20 (×3): qty 2

## 2021-04-20 MED ORDER — FUROSEMIDE 40 MG PO TABS
40.0000 mg | ORAL_TABLET | Freq: Two times a day (BID) | ORAL | Status: DC
  Administered 2021-04-20 – 2021-04-23 (×6): 40 mg via ORAL
  Filled 2021-04-20 (×6): qty 1

## 2021-04-20 NOTE — Progress Notes (Signed)
Occupational Therapy Session Note  Patient Details  Name: Sherri Armentrout MRN: [id number] Date of Birth: [birth year]  Today's Date: 04/20/2021 OT Individual Time: 1520-1600 OT Individual Time Calculation (min): 40 min    Short Term Goals: Week 1:  OT Short Term Goal 1 (Week 1): patient will complete lower body bathing and dressing at bed level with mod/max A OT Short Term Goal 2 (Week 1): patient will complete SB transfer to/from level surfaces with mod A of 2 OT Short Term Goal 3 (Week 1): patient will complete toileting on drop arm commode with mod a of 2  Skilled Therapeutic Interventions/Progress Updates:     Pt received in bed with  no pain reported but pt prefer EOB activity v OOB actiivity. During rest breaks OT discusses PLOF, leisure/hobbies, and return to IADLs with pt as pts passion is cooking.  Pt requires MIN A for Sup>sit EOB with increased time for LE management. Pt requires MAX A for sit>supine and MAX A for scooting to [facility] at end of session.  Therapeutic exercise Pt completes 3x30 ball toss (chest, bounce, overhead pass) in unsupported seated position with 1.5  # wrist weights to improve BUE coordination/strengthening required for BADLs/functional transfers.  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 General:   Vital Signs: Therapy Vitals Temp: 98 F (36.7 C) Pulse Rate: 77 Resp: 18 BP: (!) 143/63 Patient Position (if appropriate): Lying Oxygen Therapy SpO2: 98 % O2 Device: Room Air Pain:   ADL: ADL Eating: Set up Where Assessed-Eating: Wheelchair Grooming: Setup Where Assessed-Grooming: Wheelchair Upper Body Bathing: Minimal assistance Where Assessed-Upper Body Bathing:  Wheelchair Lower Body Bathing: Dependent Where Assessed-Lower Body Bathing: Bed level Upper Body Dressing: Setup Where Assessed-Upper Body Dressing: Wheelchair Lower Body Dressing: Dependent Where Assessed-Lower Body Dressing: Bed level Toileting: Dependent Where Assessed-Toileting: Bed level Vision   Perception    Praxis   Exercises:   Other Treatments:     Therapy/Group: Individual Therapy  Tonny Branch 04/20/2021, 6:56 AM

## 2021-04-20 NOTE — Progress Notes (Signed)
Physical Therapy Session Note  Patient Details  Name: Sherri Dixon MRN: [id number] Date of Birth: May 22, 1981  Today's Date: 04/20/2021 PT Individual Time: 0800-0858 PT Individual Time Calculation (min): 58 min   Short Term Goals: Week 1:  PT Short Term Goal 1 (Week 1): pt will transfer bed<>chair with LRAD and max of 1 PT Short Term Goal 2 (Week 1): pt will transfer sit<>stand with max A of 1 PT Short Term Goal 3 (Week 1): pt will perform WC mobility 81f with supervision  Skilled Therapeutic Interventions/Progress Updates:   First session:  Pt presents supine in bed and agreeable to therapy.  Pt appears more pale today, c/o abdominal pain/gassy.  BP at 146/73 in lying.  Spoke w/ RN and will inquire about med for gas.  PT donned ace wraps to LLE toes to knee.  Pt transferred sup to sit w/ min A for RLE.  Pt able to scoot to EOB, performing seated overhead press and LE there ex w/o UE support.  Pt w/ more burping, and states improved w/ sitting, but still abd pain.  Unable to locate w/c cushion.  Pt w/ decreased ability to [facility].  Pt returned to supine w/ mod A for Les.  W/ hooklying position able to assist scooting to [facility]  Bed alarm on and all needs in reach.  Second session:  Pt presents sitting in bed and able to eat lunch, improved affect.  Pt performed sup to sit w/ min to CG at EOB.  Pt required max A for placing SB, but then w/ verbal and visual cues for hand placement able to perform bed > w/c transfer w/ min to mod A on descending bias.  Pt able to maintain NWB RLE.  Pt wheeled self in w/c x 20' w/ BUEs and LLE, 20' w/ BUEs only.  Pt requires seated rest breaks 2/2 fatigue.  Pt required mod to max A for SB transfer w/c > bed 2/2 upward bias, blocking L knee.  Pt required mod A for Les sit to supine.  Spouse present during rx and education given for SB transfers for home for safety.  Pt handed off to NT and RN for use of bedpan.     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 Pain Assessment Pain Scale: 0-10 Pain Score: 0-No pain     Therapy/Group: Individual Therapy  JLadoris Gene7/23/2022, 11:01 AM

## 2021-04-20 NOTE — Progress Notes (Signed)
PROGRESS NOTE   Subjective/Complaints:   ROS: Patient denies fever, rash, sore throat, blurred vision, nausea, vomiting, diarrhea, cough, shortness of breath or chest pain,  headache, or mood change.    Objective:   No results found. Recent Labs    04/18/21 0713  WBC 12.9*  HGB 8.2*  HCT 25.4*  PLT 518*    Recent Labs    04/19/21 0101 04/20/21 0553  NA 138 137  K 3.3* 3.1*  CL 101 101  CO2 32 33*  GLUCOSE 149* 135*  BUN 9 8  CREATININE 1.10* 1.08*  CALCIUM 7.7* 7.7*     Intake/Output Summary (Last 24 hours) at 04/20/2021 0827 Last data filed at 04/19/2021 2300 Gross per 24 hour  Intake 671 ml  Output --  Net 671 ml         Physical Exam: Vital Signs Blood pressure (!) 143/63, pulse 77, temperature 98 F (36.7 C), resp. rate 18, height 5' 2"  (1.575 m), weight 123.7 kg, last menstrual period 04/05/2021, SpO2 98 %. Constitutional: No distress . Vital signs reviewed. HEENT: EOMI, oral membranes moist Neck: supple Cardiovascular: RRR without murmur. No JVD    Respiratory/Chest: CTA Bilaterally without wheezes or rales. Normal effort    GI/Abdomen: BS +, non-tender, non-distended Ext: no clubbing, cyanosis, or edema Psych: pleasant and cooperative  Skin: right foot amp site with serosanguinous drainage. Incision a little too moist from telfa. Neurologic: Cranial nerves II through XII intact, motor strength is 5/5 in right and 4+ Left deltoid, bicep, tricep, grip, hip flexor, knee extensors, ankle dorsiflexor and plantar flexor Sensory: decreased LT bilateral distal LE   Musculoskeletal: Full range of motion in all 4 extremities.       Assessment/Plan: 1. Functional deficits which require 3+ hours per day of interdisciplinary therapy in a comprehensive inpatient rehab setting. Physiatrist is providing close team supervision and 24 hour management of active medical problems listed below. Physiatrist  and rehab team continue to assess barriers to discharge/monitor patient progress toward functional and medical goals  Care Tool:  Bathing              Bathing assist       Upper Body Dressing/Undressing Upper body dressing   What is the patient wearing?: Hospital gown only    Upper body assist Assist Level: Supervision/Verbal cueing    Lower Body Dressing/Undressing Lower body dressing      What is the patient wearing?: Incontinence brief     Lower body assist Assist for lower body dressing: Moderate Assistance - Patient 50 - 74%     Toileting Toileting    Toileting assist Assist for toileting: Moderate Assistance - Patient 50 - 74%     Transfers Chair/bed transfer  Transfers assist     Chair/bed transfer assist level: Moderate Assistance - Patient 50 - 74% (slide board transfers)     Locomotion Ambulation   Ambulation assist   Ambulation activity did not occur: Safety/medical concerns (fatigue, weakness, dizziness)          Walk 10 feet activity   Assist  Walk 10 feet activity did not occur: Safety/medical concerns (fatigue, weakness, dizziness)  Walk 50 feet activity   Assist Walk 50 feet with 2 turns activity did not occur: Safety/medical concerns (fatigue, weakness, dizziness)         Walk 150 feet activity   Assist Walk 150 feet activity did not occur: Safety/medical concerns (fatigue, weakness, dizziness)         Walk 10 feet on uneven surface  activity   Assist Walk 10 feet on uneven surfaces activity did not occur: Safety/medical concerns (fatigue, weakness, dizziness)         Wheelchair     Assist Will patient use wheelchair at discharge?: Yes Type of Wheelchair: Manual Wheelchair activity did not occur: Safety/medical concerns (fatigue, weakness, dizziness)         Wheelchair 50 feet with 2 turns activity    Assist    Wheelchair 50 feet with 2 turns activity did not occur: Safety/medical  concerns (fatigue, weakness, dizziness)       Wheelchair 150 feet activity     Assist  Wheelchair 150 feet activity did not occur: Safety/medical concerns (fatigue, weakness, dizziness)       Blood pressure (!) 143/63, pulse 77, temperature 98 F (36.7 C), resp. rate 18, height 5' 2"  (1.575 m), weight 123.7 kg, last menstrual period 04/05/2021, SpO2 98 %.    Medical Problem List and Plan: 1.  Impaired self care and mobility  secondary to Right trans met amp with NWB x 6 wks             -patient may  shower with RLE wrapped             -ELOS/Goals: 14-17d , minA goals  -Continue CIR therapies including PT, OT  2.  Antithrombotics: -DVT/anticoagulation:  Pharmaceutical: Xarelto             -antiplatelet therapy: N/AA 3. Pain Management: Denies pain.  4. Mood: LCSW to follow for evaluation and support             -antipsychotic agents: N/AA 5. Neuropsych: This patient is capable of making decisions on her own behalf. 6. Skin/Wound Care:   oil emersion dressing, 4x4 gauze and kerlix to wound daily. Spoke with nursing 7. Fluids/Electrolytes/Nutrition: Monitor intake/output.  Check c-Met in AM. 8.  Right foot infection: Continue rocephin and flagyl for 6 weeks with end date 08/26. 9.  T2DM: Hemoglobin A1c-8.7.   --Will monitor blood sugars AC/at bedtime basis. CBG (last 3)  Recent Labs (last 2 labs)        Recent Labs    04/16/21 2213 04/17/21 0730 04/17/21 1236  GLUCAP 137* 110* 96     -7/22 sugars under reasonable control at present 10.  Microcytic anemia: Monitor H&H with serial checks.  Repeat CBC in AM. 11.  Acute kidney injury: Likely in setting of SIRS/infection and is slowly resolving             -- Monitor renal status with routine checks. 12.  History of severe malnutrition: Albumin 2.1 in May.  1.0 7/21  -no albumin on bmet--recheck Monday along with prealbumin            -protein supplement, RD f/u 13.  Super morbid obesity: BMI 54--educate patient on weight  loss to promote health and mobility.             --  consult dietitian for dietary education. 14.  Chronic diastolic CHF: Monitor for signs of overload.  Check weights daily.   --Continue metoprolol, hydralazine, furosemide and Crestor. Pt looks dry ,  BUN is up, pt states she take lasix 59m only 1 per day will reduce to 461mBID and monitor  Filed Weights   04/17/21 1728 04/18/21 0500 04/20/21 0332  Weight: 130.7 kg 132 kg 123.7 kg   Weights fluctuating  15.  HTN: Reports of dizziness with therapy.  Monitor for systolic changes. 16. Vitamin D deficiency: start ergocalciferol 50,000U weekly for 7 weeks 17. Hypokalemia: supplement 4066mBID 2.8-->3.3 7/22,down to 3.1 today , increase 73m61mID x 1 d, then resume 73me85mily , rechek in am  18. Hypomagnesemia: supplemented 2g IV yesterday, repeat 7/22 for 1.6 -recheck labs Saturday -oral supplement .   LOS: 3 days A FACE TO FACE EVALUATION WAS PERFORMED  AndreCharlett Blake/2022, 8:27 AM

## 2021-04-20 NOTE — Progress Notes (Signed)
Occupational Therapy Session Note  Patient Details  Name: Sherri Dixon MRN: [id number] Date of Birth: 08-13-1981  Today's Date: 04/20/2021 OT Individual Time: [id number] OT Individual Time Calculation (min): 47 min    Short Term Goals: Week 1:  OT Short Term Goal 1 (Week 1): patient will complete lower body bathing and dressing at bed level with mod/max A OT Short Term Goal 2 (Week 1): patient will complete SB transfer to/from level surfaces with mod A of 2 OT Short Term Goal 3 (Week 1): patient will complete toileting on drop arm commode with mod a of 2  Skilled Therapeutic Interventions/Progress Updates:    Treatment session with focus on self-care retraining.  Pt received supine in bed expressing desire to engage in bathing/dressing tasks.  Pt completed bed mobility with min assist to advance LLE towards EOB.  Pt engaged in UB bathing seated EOB with setup and intermittent CGA when leaning for safety.  Therapist encouraged pt to complete LB bathing and perineal bathing at bed level to allow for increased access due to body habitus.  Pt required assistance to lift LLE to allow pt to complete perineal bathing.  Pt rolled R and Lt with min assist to allow therapist to wash buttocks and when donning brief and pants over hips.  Therapist educated on LB dressing strategies with pt requiring max-total assist due to pain, stomach discomfort and difficulty lifting LLE.  Pt remained semi-reclined in bed with all needs in reach.  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

## 2021-04-21 DIAGNOSIS — Z89431 Acquired absence of right foot: Secondary | ICD-10-CM | POA: Diagnosis not present

## 2021-04-21 LAB — BASIC METABOLIC PANEL
Anion gap: 3 — ABNORMAL LOW (ref 5–15)
BUN: 6 mg/dL (ref 6–20)
CO2: 34 mmol/L — ABNORMAL HIGH (ref 22–32)
Calcium: 7.9 mg/dL — ABNORMAL LOW (ref 8.9–10.3)
Chloride: 103 mmol/L (ref 98–111)
Creatinine, Ser: 1.02 mg/dL — ABNORMAL HIGH (ref 0.44–1.00)
GFR, Estimated: >60 mL/min (ref >60)
Glucose, Bld: 103 mg/dL — ABNORMAL HIGH (ref 70–99)
Potassium: 3.6 mmol/L (ref 3.5–5.1)
Sodium: 140 mmol/L (ref 135–145)

## 2021-04-21 LAB — GLUCOSE, CAPILLARY
Glucose-Capillary: 105 mg/dL — ABNORMAL HIGH (ref 70–99)
Glucose-Capillary: 172 mg/dL — ABNORMAL HIGH (ref 70–99)
Glucose-Capillary: 135 mg/dL — ABNORMAL HIGH (ref 70–99)
Glucose-Capillary: 111 mg/dL — ABNORMAL HIGH (ref 70–99)

## 2021-04-21 NOTE — Progress Notes (Signed)
PROGRESS NOTE   Subjective/Complaints:  Reviewed bloodwork, pt feels like legs are much thinner ROS: Patient denies CP, SOB, N/V/D   Objective:   No results found. Recent Labs    04/18/21 0713  WBC 12.9*  HGB 8.2*  HCT 25.4*  PLT 518*   Recent Labs    04/20/21 0553 04/21/21 0517  NA 137 140  K 3.1* 3.6  CL 101 103  CO2 33* 34*  GLUCOSE 135* 103*  BUN 8 6  CREATININE 1.08* 1.02*  CALCIUM 7.7* 7.9*    Intake/Output Summary (Last 24 hours) at 04/21/2021 0710 Last data filed at 04/20/2021 1900 Gross per 24 hour  Intake 900 ml  Output --  Net 900 ml        Physical Exam: Vital Signs Blood pressure (!) 146/75, pulse 69, temperature 98.1 F (36.7 C), temperature source Oral, resp. rate 16, height _0  (1.575 m), weight 122.6 kg, last menstrual period 04/05/2021, SpO2 100 %.  General: No acute distress, morbid obesity  Mood and affect are appropriate Heart: Regular rate and rhythm no rubs murmurs or extra sounds Lungs: Clear to auscultation, breathing unlabored, no rales or wheezes Abdomen: Positive bowel sounds, soft nontender to palpation, nondistended Extremities: No clubbing, cyanosis, or edema Skin: No evidence of breakdown, no evidence of rash   Skin: right foot amp site with serosanguinous drainage. Incision a little too moist from telfa. Neurologic: Cranial nerves II through XII intact, motor strength is 5/5 in right and 4+ Left deltoid, bicep, tricep, grip, hip flexor, knee extensors, ankle dorsiflexor and plantar flexor Sensory: decreased LT bilateral distal LE   Musculoskeletal: Full range of motion in all 4 extremities.       Assessment/Plan: 1. Functional deficits which require 3+ hours per day of interdisciplinary therapy in a comprehensive inpatient rehab setting. Physiatrist is providing close team supervision and 24 hour management of active medical problems listed below. Physiatrist  and rehab team continue to assess barriers to discharge/monitor patient progress toward functional and medical goals  Care Tool:  Bathing    Body parts bathed by patient: Right arm, Left arm, Chest, Abdomen   Body parts bathed by helper: Front perineal area, Buttocks, Right upper leg, Left upper leg Body parts n/a: Right lower leg, Left lower leg (R transmet, L wrapped)   Bathing assist Assist Level: Maximal Assistance - Patient 24 - 49%     Upper Body Dressing/Undressing Upper body dressing   What is the patient wearing?: Bra, Pull over shirt    Upper body assist Assist Level: Supervision/Verbal cueing    Lower Body Dressing/Undressing Lower body dressing      What is the patient wearing?: Incontinence brief, Pants     Lower body assist Assist for lower body dressing: Total Assistance - Patient < 25%     Toileting Toileting    Toileting assist Assist for toileting: Moderate Assistance - Patient 50 - 74%     Transfers Chair/bed transfer  Transfers assist     Chair/bed transfer assist level: Moderate Assistance - Patient 50 - 74% (slide board)     Locomotion Ambulation   Ambulation assist   Ambulation activity did not occur: Safety/medical  concerns (fatigue, weakness, dizziness)          Walk 10 feet activity   Assist  Walk 10 feet activity did not occur: Safety/medical concerns (fatigue, weakness, dizziness)        Walk 50 feet activity   Assist Walk 50 feet with 2 turns activity did not occur: Safety/medical concerns (fatigue, weakness, dizziness)         Walk 150 feet activity   Assist Walk 150 feet activity did not occur: Safety/medical concerns (fatigue, weakness, dizziness)         Walk 10 feet on uneven surface  activity   Assist Walk 10 feet on uneven surfaces activity did not occur: Safety/medical concerns (fatigue, weakness, dizziness)         Wheelchair     Assist Will patient use wheelchair at discharge?:  Yes Type of Wheelchair: Manual Wheelchair activity did not occur: Safety/medical concerns (fatigue, weakness, dizziness)  Wheelchair assist level: Contact Guard/Touching assist Max wheelchair distance: 20    Wheelchair 50 feet with 2 turns activity    Assist    Wheelchair 50 feet with 2 turns activity did not occur: Safety/medical concerns (fatigue, weakness, dizziness)       Wheelchair 150 feet activity     Assist  Wheelchair 150 feet activity did not occur: Safety/medical concerns (fatigue, weakness, dizziness)       Blood pressure (!) 146/75, pulse 69, temperature 98.1 F (36.7 C), temperature source Oral, resp. rate 16, height _0  (1.575 m), weight 122.6 kg, last menstrual period 04/05/2021, SpO2 100 %.    Medical Problem List and Plan: 1.  Impaired self care and mobility  secondary to Right trans met amp with NWB x 6 wks             -patient may  shower with RLE wrapped             -ELOS/Goals: 14-17d , minA goals  -Continue CIR therapies including PT, OT  2.  Antithrombotics: -DVT/anticoagulation:  Pharmaceutical: Xarelto             -antiplatelet therapy: N/AA 3. Pain Management: Denies pain.  4. Mood: LCSW to follow for evaluation and support             -antipsychotic agents: N/AA 5. Neuropsych: This patient is capable of making decisions on her own behalf. 6. Skin/Wound Care:   oil emersion dressing, 4x4 gauze and kerlix to wound daily. Spoke with nursing 7. Fluids/Electrolytes/Nutrition: Monitor intake/output.  Check c-Met in AM. 8.  Right foot infection: Continue rocephin and flagyl for 6 weeks with end date 08/26. 9.  T2DM: Hemoglobin A1c-8.7.   --Will monitor blood sugars AC/at bedtime basis. CBG (last 3)  Recent Labs    04/20/21 1645 04/20/21 2106 04/21/21 0552  GLUCAP 150* 122* 105*  Controlled 7/24   10.  Microcytic anemia: Monitor H&H with serial checks.  Repeat CBC in AM. 11.  Acute kidney injury: Likely in setting of SIRS/infection  and is slowly resolving             -- Monitor renal status with routine checks. 12.  History of severe malnutrition: Albumin 2.1 in May.  1.0 7/21  -no albumin on bmet--recheck Monday along with prealbumin            -protein supplement, RD f/u 13.  Super morbid obesity: BMI 54--educate patient on weight loss to promote health and mobility.             --  consult dietitian for dietary education. 14.  Chronic diastolic CHF: Monitor for signs of overload.  Check weights daily.   --Continue metoprolol, hydralazine, furosemide and Crestor. Pt looks dry , BUN nl pt states she take lasix 22m only 1 per day will reduce to 418mBID and monitor  Filed Weights   04/18/21 0500 04/20/21 0332 04/21/21 0408  Weight: 132 kg 123.7 kg 122.6 kg   Weights down last 2 reading will cont on lower dose Lasix 4043mID ( home dose per pt is 60m34m) 15.  HTN: Reports of dizziness with therapy.  Monitor for systolic changes. 16. Vitamin D deficiency: start ergocalciferol 50,000U weekly for 7 weeks 17. Hypokalemia: supplement 40me8mD 2.8-->3.3 7/22,down to 3.1 today , increase 40meq65m x 1 d, then resume 40meq 41my , recheck in am  improved to 3.6 this am  18. Hypomagnesemia: supplemented 2g IV yesterday, repeat 7/22 for 1.6 -recheck labs Saturday -oral supplement .   LOS: 4 days A FACE TO FACE EVALUATION WAS PERFORMED  Kiano Terrien Charlett Blake022, 7:10 AM

## 2021-04-22 DIAGNOSIS — Z89431 Acquired absence of right foot: Secondary | ICD-10-CM | POA: Diagnosis not present

## 2021-04-22 LAB — CBC
HCT: 25.4 % — ABNORMAL LOW (ref 36.0–46.0)
Hemoglobin: 7.7 g/dL — ABNORMAL LOW (ref 12.0–15.0)
MCH: 28 pg (ref 26.0–34.0)
MCHC: 30.3 g/dL (ref 30.0–36.0)
MCV: 92.4 fL (ref 80.0–100.0)
Platelets: 465 10*3/uL — ABNORMAL HIGH (ref 150–400)
RBC: 2.75 MIL/uL — ABNORMAL LOW (ref 3.87–5.11)
RDW: 16.4 % — ABNORMAL HIGH (ref 11.5–15.5)
WBC: 8.5 10*3/uL (ref 4.0–10.5)
nRBC: 0 % (ref 0.0–0.2)

## 2021-04-22 LAB — COMPREHENSIVE METABOLIC PANEL
ALT: 20 U/L (ref 0–44)
AST: 21 U/L (ref 15–41)
Albumin: <1 g/dL — ABNORMAL LOW (ref 3.5–5.0)
Alkaline Phosphatase: 141 U/L — ABNORMAL HIGH (ref 38–126)
Anion gap: 5 (ref 5–15)
BUN: 5 mg/dL — ABNORMAL LOW (ref 6–20)
CO2: 33 mmol/L — ABNORMAL HIGH (ref 22–32)
Calcium: 7.8 mg/dL — ABNORMAL LOW (ref 8.9–10.3)
Chloride: 101 mmol/L (ref 98–111)
Creatinine, Ser: 1.21 mg/dL — ABNORMAL HIGH (ref 0.44–1.00)
GFR, Estimated: 57 mL/min — ABNORMAL LOW (ref >60)
Glucose, Bld: 90 mg/dL (ref 70–99)
Potassium: 3.8 mmol/L (ref 3.5–5.1)
Sodium: 139 mmol/L (ref 135–145)
Total Bilirubin: 0.6 mg/dL (ref 0.3–1.2)
Total Protein: 5.5 g/dL — ABNORMAL LOW (ref 6.5–8.1)

## 2021-04-22 LAB — PREPARE RBC (CROSSMATCH)

## 2021-04-22 LAB — PREALBUMIN: Prealbumin: 11.9 mg/dL — ABNORMAL LOW (ref 18–38)

## 2021-04-22 LAB — GLUCOSE, CAPILLARY
Glucose-Capillary: 111 mg/dL — ABNORMAL HIGH (ref 70–99)
Glucose-Capillary: 95 mg/dL (ref 70–99)
Glucose-Capillary: 79 mg/dL (ref 70–99)
Glucose-Capillary: 120 mg/dL — ABNORMAL HIGH (ref 70–99)

## 2021-04-22 MED ORDER — SODIUM CHLORIDE 0.9% IV SOLUTION: Freq: Once | INTRAVENOUS | Status: AC

## 2021-04-22 MED ORDER — SODIUM CHLORIDE 0.9 % IV BOLUS
250.0000 mL | Freq: Once | INTRAVENOUS | Status: AC
  Administered 2021-04-22: 250 mL via INTRAVENOUS

## 2021-04-22 NOTE — Progress Notes (Signed)
Physical Therapy Session Note  Patient Details  Name: Sherri Dixon MRN: [id number] Date of Birth: June 10, 1981  Today's Date: 04/22/2021 PT Individual Time: 0900-0913 PT Individual Time Calculation (min): 13 min   Short Term Goals: Week 1:  PT Short Term Goal 1 (Week 1): pt will transfer bed<>chair with LRAD and max of 1 PT Short Term Goal 2 (Week 1): pt will transfer sit<>stand with max A of 1 PT Short Term Goal 3 (Week 1): pt will perform WC mobility 56f with supervision  Skilled Therapeutic Interventions/Progress Updates:     Upon entrance into room, NT at bedside who reports she just placed pt on bedpan and pt requesting time and privacy for toileting. Because of this, pt missed 17 minutes of skilled therapy. With what time was remaining, focused session on BLE strengthening at bed level. Completed the following supine there-ex bilaterally:  -1x20 ankle pumps -1x10 resistive hip abduction/adduction -1x10 heel slides (AAROM) -1x10 SLR (AAROM)  NT provided +2 assist for repositioning in bed and pt remained supine in bed with needs in reach and bed alarm on, pt made comfortable.  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

## 2021-04-22 NOTE — Progress Notes (Signed)
PROGRESS NOTE   Subjective/Complaints: She is feeling weak this morning and was not able to complete therapy as a result. Also feeling depressed today about being here and would like to go home as soon as possible.   ROS: Patient denies CP, SOB, N/V/D, +fatigue   Objective:   No results found. Recent Labs    04/22/21 0346  WBC 8.5  HGB 7.7*  HCT 25.4*  PLT 465*   Recent Labs    04/21/21 0517 04/22/21 0346  NA 140 139  K 3.6 3.8  CL 103 101  CO2 34* 33*  GLUCOSE 103* 90  BUN 6 5*  CREATININE 1.02* 1.21*  CALCIUM 7.9* 7.8*    Intake/Output Summary (Last 24 hours) at 04/22/2021 1237 Last data filed at 04/22/2021 0700 Gross per 24 hour  Intake 710 ml  Output 1 ml  Net 709 ml        Physical Exam: Vital Signs Blood pressure (!) 147/77, pulse 74, temperature 98.1 F (36.7 C), temperature source Oral, resp. rate 18, height _0  (1.575 m), weight 121.3 kg, last menstrual period 04/05/2021, SpO2 96 %. Gen: no distress, normal appearing HEENT: oral mucosa pink and moist, NCAT Cardio: Reg rate Chest: normal effort, normal rate of breathing Abd: soft, non-distended Ext: no edema Psych: pleasant, normal affect   Skin: right foot amp site with serosanguinous drainage. Incision a little too moist from telfa. Neurologic: Cranial nerves II through XII intact, motor strength is 5/5 in right and 4+ Left deltoid, bicep, tricep, grip, hip flexor, knee extensors, ankle dorsiflexor and plantar flexor Sensory: decreased LT bilateral distal LE   Musculoskeletal: Full range of motion in all 4 extremities.       Assessment/Plan: 1. Functional deficits which require 3+ hours per day of interdisciplinary therapy in a comprehensive inpatient rehab setting. Physiatrist is providing close team supervision and 24 hour management of active medical problems listed below. Physiatrist and rehab team continue to assess barriers to  discharge/monitor patient progress toward functional and medical goals  Care Tool:  Bathing    Body parts bathed by patient: Right arm, Left arm, Chest, Abdomen, Face   Body parts bathed by helper: Front perineal area, Buttocks, Right upper leg, Left upper leg Body parts n/a: Right lower leg, Left lower leg (R transmet, L wrapped)   Bathing assist Assist Level: Supervision/Verbal cueing     Upper Body Dressing/Undressing Upper body dressing   What is the patient wearing?: Bra, Pull over shirt    Upper body assist Assist Level: Minimal Assistance - Patient > 75%    Lower Body Dressing/Undressing Lower body dressing      What is the patient wearing?: Incontinence brief, Pants     Lower body assist Assist for lower body dressing: Total Assistance - Patient < 25%     Toileting Toileting    Toileting assist Assist for toileting: Moderate Assistance - Patient 50 - 74%     Transfers Chair/bed transfer  Transfers assist     Chair/bed transfer assist level: Moderate Assistance - Patient 50 - 74% (slide board)     Locomotion Ambulation   Ambulation assist   Ambulation activity did not occur:  Safety/medical concerns (fatigue, weakness, dizziness)          Walk 10 feet activity   Assist  Walk 10 feet activity did not occur: Safety/medical concerns (fatigue, weakness, dizziness)        Walk 50 feet activity   Assist Walk 50 feet with 2 turns activity did not occur: Safety/medical concerns (fatigue, weakness, dizziness)         Walk 150 feet activity   Assist Walk 150 feet activity did not occur: Safety/medical concerns (fatigue, weakness, dizziness)         Walk 10 feet on uneven surface  activity   Assist Walk 10 feet on uneven surfaces activity did not occur: Safety/medical concerns (fatigue, weakness, dizziness)         Wheelchair     Assist Will patient use wheelchair at discharge?: Yes Type of Wheelchair: Manual Wheelchair  activity did not occur: Safety/medical concerns (fatigue, weakness, dizziness)  Wheelchair assist level: Contact Guard/Touching assist Max wheelchair distance: 20    Wheelchair 50 feet with 2 turns activity    Assist    Wheelchair 50 feet with 2 turns activity did not occur: Safety/medical concerns (fatigue, weakness, dizziness)       Wheelchair 150 feet activity     Assist  Wheelchair 150 feet activity did not occur: Safety/medical concerns (fatigue, weakness, dizziness)       Blood pressure (!) 147/77, pulse 74, temperature 98.1 F (36.7 C), temperature source Oral, resp. rate 18, height _0  (1.575 m), weight 121.3 kg, last menstrual period 04/05/2021, SpO2 96 %.    Medical Problem List and Plan: 1.  Impaired self care and mobility  secondary to Right trans met amp with NWB x 6 wks             -patient may  shower with RLE wrapped             -ELOS/Goals: 14-17d , minA goals  -Continue CIR therapies including PT, OT  2.  Antithrombotics: -DVT/anticoagulation:  Pharmaceutical: Xarelto             -antiplatelet therapy: N/AA 3. Pain Management: Denies pain.  4. Mood: LCSW to follow for evaluation and support             -antipsychotic agents: N/AA 5. Neuropsych: This patient is capable of making decisions on her own behalf. 6. Skin/Wound Care:   oil emersion dressing, 4x4 gauze and kerlix to wound daily. Spoke with nursing 7. Fluids/Electrolytes/Nutrition: Monitor intake/output.  Check c-Met in AM. 8.  Right foot infection: Continue rocephin and flagyl for 6 weeks with end date 08/26. 9.  T2DM: Hemoglobin A1c-8.7.   --Will monitor blood sugars AC/at bedtime basis. CBG (last 3)  Recent Labs    04/21/21 2126 04/22/21 0600 04/22/21 1131  GLUCAP 111* 111* 95  Controlled 7/24   10.  Microcytic anemia: Monitor H&H with serial checks.  Repeat CBC in AM. 11.  Acute kidney injury: Likely in setting of SIRS/infection and is slowly resolving             -- Monitor  renal status with routine checks. 12.  History of severe malnutrition: Albumin 2.1 in May.  1.0 7/21  -no albumin on bmet--recheck Monday along with prealbumin            -protein supplement, RD f/u 13.  Super morbid obesity: BMI 54--educate patient on weight loss to promote health and mobility.             --  consult dietitian for dietary education. 14.  Chronic diastolic CHF: Monitor for signs of overload.  Check weights daily.   --Continue metoprolol, hydralazine, furosemide and Crestor. Pt looks dry , BUN nl pt states she take lasix 53m only 1 per day will reduce to 483mBID and monitor  Filed Weights   04/20/21 0332 04/21/21 0408 04/22/21 0400  Weight: 123.7 kg 122.6 kg 121.3 kg   Weights down last 2 reading will cont on lower dose Lasix 4050mID ( home dose per pt is 28m65m) 15.  HTN: Reports of dizziness with therapy.  Monitor for systolic changes. 16. Vitamin D deficiency: start ergocalciferol 50,000U weekly for 7 weeks 17. Hypokalemia: improved to 2.8, continue supplementation and repeat K+ tomorrow 18. Hypomagnesemia: supplemented 2g IV yesterday, repeat 7/22 for 1.6 -recheck labs Saturday -oral supplement .  19. Symptomatic anemia: type & screen, 1U PRBCs today. Hgb 7.7, repeat tomorrow 20. AKI: 250cc NS bolus today, repeat BMP tomorrow  LOS: 5 days A FACE TO FACE EVALUATION WAS PERFORMED  KrutClide Deutscherlkar 04/22/2021, 12:37 PM

## 2021-04-22 NOTE — Progress Notes (Signed)
Occupational Therapy Session Note  Patient Details  Name: Sherri Dixon MRN: [id number] Date of Birth: August 04, 1981  Today's Date: 04/22/2021 OT Individual Time: 7793-9030 OT Individual Time Calculation (min): 56 min    Short Term Goals: Week 1:  OT Short Term Goal 1 (Week 1): patient will complete lower body bathing and dressing at bed level with mod/max A OT Short Term Goal 2 (Week 1): patient will complete SB transfer to/from level surfaces with mod A of 2 OT Short Term Goal 3 (Week 1): patient will complete toileting on drop arm commode with mod a of 2  Skilled Therapeutic Interventions/Progress Updates:  Met pt lying supine with HOB elevated, bed alarm on, pt agreed to session. Treatment session was focused on self-care retraining. Pt was CGA for bed mobility > EOB. Pt doffed hospital gown with Min A requiring assist with untying the back. Pt only completed UB bathing and grooming with supervision sitting EOB. Pt required Min A for donning UB dressing, requiring assist with posteriorly managing clothing. Pt requested to put on sweatpants but quickly became fatigued throughout process. OT educated pt on clothing alternatives to eliminate fatigue during dressing tasks. Pt will benefit from continued education on energy conservation techniques.  Pt began to feel light-headed and dizzy, NT called to take BG, OTS took vitals (BP - 127/65 and O2 - 98%). Pt remained EOB during rest breaks. Pt able to laterally lean to donn pants, required assist with pulling pants over hips. Pt required Mod A for sitting EOB > lying requiring assist with bringing LE onto bed. Pt was left lying supine with HOB elevated, bed alarm on, all needs met.   MD notified of fatigue, light-headedness and dizziness during session.   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

## 2021-04-22 NOTE — Progress Notes (Signed)
Occupational Therapy Session Note  Patient Details  Name: Sherri Dixon MRN: [id number] Date of Birth: 06-27-1981  Today's Date: 04/22/2021 OT Individual Time: 1500-1545 OT Individual Time Calculation (min): 45 min    Short Term Goals: Week 1:  OT Short Term Goal 1 (Week 1): patient will complete lower body bathing and dressing at bed level with mod/max A OT Short Term Goal 2 (Week 1): patient will complete SB transfer to/from level surfaces with mod A of 2 OT Short Term Goal 3 (Week 1): patient will complete toileting on drop arm commode with mod a of 2  Skilled Therapeutic Interventions/Progress Updates:    Pt sitting up in w/c, with lab tech finishing up.  Pt c/o RLE numbness and discomfort and reports her leg keeps falling off the leg rest.  Therapist provided reinforcement to current leg rest using strapping and multiple pillows for increased support and comfort as well as promote upright sitting tolerance.  Pt required total assist to reposition RLE on leg rest.  Pt expressing strong desire to go home and reports feeling very home sick.  OT provided therapeutic use of self to validate pts feelings and primary therapist notified of pt's concerns.  Call bell in reach, seat alarm on, RLE elevated at end of session.  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

## 2021-04-22 NOTE — Progress Notes (Signed)
Physical Therapy Session Note  Patient Details  Name: Sherri Dixon MRN: [id number] Date of Birth: December 10, 1980  Today's Date: 04/22/2021 PT Individual Time: 1330-1430 PT Individual Time Calculation (min): 60 min   Short Term Goals: Week 1:  PT Short Term Goal 1 (Week 1): pt will transfer bed<>chair with LRAD and max of 1 PT Short Term Goal 2 (Week 1): pt will transfer sit<>stand with max A of 1 PT Short Term Goal 3 (Week 1): pt will perform WC mobility 46f with supervision  Skilled Therapeutic Interventions/Progress Updates:    Pain:  Pt reports RLE pain.  Treatment to tolerance.  Rest breaks and repositioning as needed.  Pt initially supine on bedpan and agreeable to treatment session w/focus on functional mobility, global strengthening. Pt rolled to L w/supervision and additional time, to R w/min assist, uses bedrails mult times during removal of bedpan, perineal care, brief change, and lower body dressing.  Pt was able to assist w/perineal care and performed approx 25% w/set up only, NT assisted w/75% and therapist assisted w/lower body positioning to facilitate thorough cleaning/drying.   Pt supine to L side to sit w/use of rail, light min assist.  Pt sits on edge of bed w/supervision.  NT recorded vitals, WNLs for pt. Pt then able to wt shift tfully to R for board placement by therapist and returns to upright w/supervision.  Bed to wc via sliding board transfer NWB RLE w/cues for sequencing/mechanics, min assist overall, and therapist provides stabilization of wc as well for safety.  Once in wc, RLE elevated but RLE not secure on legrest due to body habitus.  Therapist used gait belt to temporarily secure limb.  Pt propelled wc during session: 388fx 1, 258f 2 w/supervision for straight path only. Therapist obtained [phone] foam cushion for placment in wc for improved sitting comfort, no larger cushion available at time, will request further assessment of viability of this next date. 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

## 2021-04-23 DIAGNOSIS — Z89431 Acquired absence of right foot: Secondary | ICD-10-CM | POA: Diagnosis not present

## 2021-04-23 LAB — GLUCOSE, CAPILLARY
Glucose-Capillary: 129 mg/dL — ABNORMAL HIGH (ref 70–99)
Glucose-Capillary: 134 mg/dL — ABNORMAL HIGH (ref 70–99)
Glucose-Capillary: 112 mg/dL — ABNORMAL HIGH (ref 70–99)

## 2021-04-23 LAB — BASIC METABOLIC PANEL
Anion gap: 3 — ABNORMAL LOW (ref 5–15)
BUN: 5 mg/dL — ABNORMAL LOW (ref 6–20)
CO2: 33 mmol/L — ABNORMAL HIGH (ref 22–32)
Calcium: 7.8 mg/dL — ABNORMAL LOW (ref 8.9–10.3)
Chloride: 103 mmol/L (ref 98–111)
Creatinine, Ser: 1.24 mg/dL — ABNORMAL HIGH (ref 0.44–1.00)
GFR, Estimated: 56 mL/min — ABNORMAL LOW (ref >60)
Glucose, Bld: 95 mg/dL (ref 70–99)
Potassium: 4.1 mmol/L (ref 3.5–5.1)
Sodium: 139 mmol/L (ref 135–145)

## 2021-04-23 LAB — TYPE AND SCREEN
ABO/RH(D): A POS
Antibody Screen: NEGATIVE
Unit division: 0

## 2021-04-23 LAB — BPAM RBC
Blood Product Expiration Date: 202208102359
ISSUE DATE / TIME: 202207251719
Unit Type and Rh: 6200

## 2021-04-23 LAB — HEMOGLOBIN AND HEMATOCRIT, BLOOD
HCT: 27.7 % — ABNORMAL LOW (ref 36.0–46.0)
Hemoglobin: 8.6 g/dL — ABNORMAL LOW (ref 12.0–15.0)

## 2021-04-23 LAB — SARS CORONAVIRUS 2 (TAT 6-24 HRS): SARS Coronavirus 2: NEGATIVE

## 2021-04-23 MED ORDER — FUROSEMIDE 20 MG PO TABS
20.0000 mg | ORAL_TABLET | Freq: Two times a day (BID) | ORAL | Status: DC
  Administered 2021-04-23 – 2021-04-26 (×6): 20 mg via ORAL
  Filled 2021-04-23 (×6): qty 1

## 2021-04-23 NOTE — Progress Notes (Signed)
Occupational Therapy Session Note  Patient Details  Name: Sherri Dixon MRN: [id number] Date of Birth: 01-26-1981  Today's Date: 04/23/2021 OT Individual Time: 4585-9292 OT Individual Time Calculation (min): 53 min    Short Term Goals: Week 1:  OT Short Term Goal 1 (Week 1): patient will complete lower body bathing and dressing at bed level with mod/max A OT Short Term Goal 2 (Week 1): patient will complete SB transfer to/from level surfaces with mod A of 2 OT Short Term Goal 3 (Week 1): patient will complete toileting on drop arm commode with mod a of 2  Skilled Therapeutic Interventions/Progress Updates:  Met pt lying supine with HOB elevated, bed alarm on, pt agreed to session. Treatment session was focused on functional transfers, toileting, and LB dressing. Pt requested to uses extra wide BSC. Pt was supervision for lying > sitting EOB. Pt slide board transferred to [facility] with Min/Mod A. Pt required cues to scoot back on [facility] for safety. Pt had a bm and urinated. Pt able to complete lateral leans to complete perineal care and requested lateral scoots to return to EOB but required 2 helpers due to difficulty transitioning from [facility] to EOB. OT educated on anterior weight shift to assist with scooting. Pt able to scoot towards [facility] with Min A, pt required cues for safety sitting EOB. Pt required further assist with perineal care after transferring back to bed via rolling with supervision. Pt able to thread pants over feet with min rest breaks. Pt required total assist with pulling  LB dressing over hips, pt able to assist with rolling. Pt would like to leave ASAP, husband called and will attempt to attend family education session tomorrow. Pt was left lying supine with HOB elevated, bed alarm on, all needs met.   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

## 2021-04-23 NOTE — Progress Notes (Addendum)
PROGRESS NOTE   Subjective/Complaints: Feeling better today but with diaphoresis, but feeling cold. Will get COVID test since she had an exposure this weekend She prefers to d/c home tomorrow, her husband is able to come in for caregiver training tomorrow.   ROS: Patient denies CP, SOB, N/V/D, +fatigue, +sweating   Objective:   No results found. Recent Labs    04/22/21 0346 04/23/21 0411  WBC 8.5  --   HGB 7.7* 8.6*  HCT 25.4* 27.7*  PLT 465*  --    Recent Labs    04/22/21 0346 04/23/21 0411  NA 139 139  K 3.8 4.1  CL 101 103  CO2 33* 33*  GLUCOSE 90 95  BUN 5* 5*  CREATININE 1.21* 1.24*  CALCIUM 7.8* 7.8*    Intake/Output Summary (Last 24 hours) at 04/23/2021 1031 Last data filed at 04/23/2021 0700 Gross per 24 hour  Intake 933 ml  Output 800 ml  Net 133 ml        Physical Exam: Vital Signs Blood pressure (!) 141/75, pulse 75, temperature 98.8 F (37.1 C), temperature source Oral, resp. rate 18, height _0  (1.575 m), weight 121.3 kg, last menstrual period 04/05/2021, SpO2 95 %. Gen: no distress, normal appearing HEENT: oral mucosa pink and moist, NCAT Cardio: Reg rate Chest: normal effort, normal rate of breathing Abd: soft, non-distended Ext: no edema Psych: pleasant, normal affect    Skin: right foot amp site with serosanguinous drainage. Incision a little too moist from telfa. Neurologic: Cranial nerves II through XII intact, motor strength is 5/5 in right and 4+ Left deltoid, bicep, tricep, grip, hip flexor, knee extensors, ankle dorsiflexor and plantar flexor Sensory: decreased LT bilateral distal LE   Musculoskeletal: Full range of motion in all 4 extremities.       Assessment/Plan: 1. Functional deficits which require 3+ hours per day of interdisciplinary therapy in a comprehensive inpatient rehab setting. Physiatrist is providing close team supervision and 24 hour management of  active medical problems listed below. Physiatrist and rehab team continue to assess barriers to discharge/monitor patient progress toward functional and medical goals  Care Tool:  Bathing    Body parts bathed by patient: Right arm, Left arm, Chest, Abdomen, Face   Body parts bathed by helper: Front perineal area, Buttocks, Right upper leg, Left upper leg Body parts n/a: Right lower leg, Left lower leg (R transmet, L wrapped)   Bathing assist Assist Level: Supervision/Verbal cueing     Upper Body Dressing/Undressing Upper body dressing   What is the patient wearing?: Bra, Pull over shirt    Upper body assist Assist Level: Minimal Assistance - Patient > 75%    Lower Body Dressing/Undressing Lower body dressing      What is the patient wearing?: Incontinence brief, Pants     Lower body assist Assist for lower body dressing: Total Assistance - Patient < 25%     Toileting Toileting    Toileting assist Assist for toileting: Moderate Assistance - Patient 50 - 74%     Transfers Chair/bed transfer  Transfers assist     Chair/bed transfer assist level: Moderate Assistance - Patient 50 - 74% (slide board)  Locomotion Ambulation   Ambulation assist   Ambulation activity did not occur: Safety/medical concerns (fatigue, weakness, dizziness)          Walk 10 feet activity   Assist  Walk 10 feet activity did not occur: Safety/medical concerns (fatigue, weakness, dizziness)        Walk 50 feet activity   Assist Walk 50 feet with 2 turns activity did not occur: Safety/medical concerns (fatigue, weakness, dizziness)         Walk 150 feet activity   Assist Walk 150 feet activity did not occur: Safety/medical concerns (fatigue, weakness, dizziness)         Walk 10 feet on uneven surface  activity   Assist Walk 10 feet on uneven surfaces activity did not occur: Safety/medical concerns (fatigue, weakness, dizziness)          Wheelchair     Assist Will patient use wheelchair at discharge?: Yes Type of Wheelchair: Manual Wheelchair activity did not occur: Safety/medical concerns (fatigue, weakness, dizziness)  Wheelchair assist level: Supervision/Verbal cueing Max wheelchair distance: 30    Wheelchair 50 feet with 2 turns activity    Assist    Wheelchair 50 feet with 2 turns activity did not occur: Safety/medical concerns       Wheelchair 150 feet activity     Assist  Wheelchair 150 feet activity did not occur: Safety/medical concerns (fatigue, weakness, dizziness)       Blood pressure (!) 141/75, pulse 75, temperature 98.8 F (37.1 C), temperature source Oral, resp. rate 18, height _0  (1.575 m), weight 121.3 kg, last menstrual period 04/05/2021, SpO2 95 %.    Medical Problem List and Plan: 1.  Impaired self care and mobility  secondary to Right trans met amp with NWB x 6 wks             -patient may  shower with RLE wrapped             -ELOS/Goals: 14-17d , minA goals  -Continue CIR therapies including PT, OT  2.  Antithrombotics: -DVT/anticoagulation:  Pharmaceutical: Xarelto             -antiplatelet therapy: N/AA 3. Pain Management: Denies pain.  4. Mood: LCSW to follow for evaluation and support             -antipsychotic agents: N/AA 5. Neuropsych: This patient is capable of making decisions on her own behalf. 6. Skin/Wound Care:   oil emersion dressing, 4x4 gauze and kerlix to wound daily. Spoke with nursing 7. Fluids/Electrolytes/Nutrition: Monitor intake/output.  Check c-Met in AM. 8.  Right foot infection: Continue rocephin and flagyl for 6 weeks with end date 08/26. 9.  T2DM: Hemoglobin A1c-8.7.   CBG (last 3)  Recent Labs    04/22/21 1631 04/22/21 2044 04/23/21 0545  GLUCAP 79 120* 129*  Controlled 7/26- d/c ISS and decrease checks to AC   10.  Microcytic anemia: Monitor H&H with serial checks.  Repeat CBC in AM. 11.  Acute kidney injury: Likely in  setting of SIRS/infection and is slowly resolving             -- Monitor renal status with routine checks. 12.  History of severe malnutrition: Albumin 2.1 in May.  1.0 7/21  -no albumin on bmet--recheck Monday along with prealbumin            -protein supplement, RD f/u 13.  Super morbid obesity: BMI 54--educate patient on weight loss to promote health and mobility.             --  consult dietitian for dietary education. 14.  Chronic diastolic CHF: Monitor for signs of overload.  Check weights daily.   --Continue metoprolol, hydralazine, furosemide and Crestor. Pt looks dry , BUN nl pt states she take lasix 54m only 1 per day will reduce to 475mBID and monitor  Filed Weights   04/20/21 0332 04/21/21 0408 04/22/21 0400  Weight: 123.7 kg 122.6 kg 121.3 kg   Weights down last 2 reading will cont on lower dose Lasix 4036mID ( home dose per pt is 78m46m) 15.  HTN: Reports of dizziness with therapy.  Monitor for systolic changes. 16. Vitamin D deficiency: start ergocalciferol 50,000U weekly for 7 weeks 17. Hypokalemia: improved to 4.1 with supplementation 18. Hypomagnesemia: supplemented 2g IV yesterday, repeat 7/22 for 1.6 -recheck labs Saturday -oral supplement .  19. Symptomatic anemia: improved to 8.6 after 1U on 7/25 20. AKI: decrease lasix to 20mg86m- repeat tomorrow.  21. Diaphoretic, feeling cold, COVID exposure this weekend: will check COVID test today 22. Disposition: likely d/c on Thursday- caregiver training with husband on Wednesday.   LOS: 6 days A FACE TO FACE EVALUATION WAS PERFORMED  KrutiClide Deutscherkar 04/23/2021, 10:31 AM

## 2021-04-23 NOTE — Progress Notes (Signed)
Physical Therapy Session Note  Patient Details  Name: Sherri Dixon MRN: [id number] Date of Birth: [birth year]  Today's Date: 04/23/2021 PT Individual Time: Session1: 1016-1100; Arthor [id number] PT Individual Time Calculation (min): 44 min & 27 min  Short Term Goals: Week 1:  PT Short Term Goal 1 (Week 1): pt will transfer bed<>chair with LRAD and max of 1 PT Short Term Goal 2 (Week 1): pt will transfer sit<>stand with max A of 1 PT Short Term Goal 3 (Week 1): pt will perform WC mobility 58f with supervision  Skilled Therapeutic Interventions/Progress Updates:    Session1:  Patient now on respiratory isolation due to Covid exposure.  Patient seen in room at bedside.  Initially on phone attempting to get social services for disability application, but they could not complete without MD declaration.  Encouraged to reach out to SW and case mFreight forwarder  Reports pain in L foot as noted and RN made aware.  Patient performed supine to sit with S using rail with increased time. Slide board transfer with min A to w/c and increased time noting R foot on floor but no body weight in foot.  Patient in w/c assisted to get legrests on then propelled with S in small space in room over 548 with mod cues for technique to turn in small space.  Patient performed slide board transfer back to bed with min A.  Performed sit <>squat on L LE using back of recliner and footboard for UE support with min a x 10 reps for L LE strengthening.  Performed seated trunk rocking arms crossed over chest, lateral trunk elevation with weight shift x 10, seated hip flexion, LAQ w/ 5 sec hold, horizontal shoulder abduction x 10 with scapular squeeze, rows x 10, ankle pumps x 20 and seated hip adductor squeezes x 10 w/ 5 sec hold.  Patient to supine with mod A for both LE's and [facility] cues for L LE and rail use with bed in trendelenberg.  Patient left with call bell and needs in reach and bed alarm active.  Session2:  Patient  in supine.  Reports spouse will be able to assist at home.  States he and another family member considering bumping her up steps, but she reports width of steps may be too narrow for this and discussed may need ambulance transport home.  Asked pt who called her spouse to have him measure doorway widths to home, bedroom and bathroom and height of car and bed.  Patient supine for HEP instruction since other HEP missing.  Patient performed SAQ, hip abduction, hip adductor squeezes, heel slides, single leg bridge on L, hooklying hip flexion w/ assist and SLR w/ assist.  Patient remembered seated exercise from this morning so added to HEP LAQ, lateral trunk flexion and trunk rocking.  All issued in written HEP.  Patient left in supine with call bell and needs in reach and bed alarm active.   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

## 2021-04-23 NOTE — Progress Notes (Signed)
Patient ID: Sherri Dixon, female   DOB: 1981/02/09, 40 y.o.   MRN: [id number] Team Conference Report to Patient/Family  Team Conference discussion was reviewed with the patient and caregiver, including goals, any changes in plan of care and target discharge date.  Patient and caregiver express understanding and are in agreement.  The patient has a target discharge date of 04/26/21.  SW met with patient, provided team conference updates Patient spouse attending education tomorrow with anticipated discharge for Friday. Patient will continue working on transfer, mobility, car trans and ADLS. No other questions or concerns, SW will continue to follow up [name] 04/23/2021, 2:37 PM

## 2021-04-23 NOTE — Progress Notes (Signed)
Occupational Therapy Note  Patient Details  Name: Sherri Dixon MRN: [id number] Date of Birth: 1981-06-28  Today's Date: 04/23/2021 OT Missed Time: 17 Minutes Missed Time Reason: Patient ill (comment);Other (comment) (pt being tested for COVID)  Pt being tested for COVID, will hold off on group sessions for now, will f/u as able to make up missed up minutes.    Corinne Ports [facility] 04/23/2021, 4:00 PM

## 2021-04-23 NOTE — Patient Care Conference (Signed)
Inpatient RehabilitationTeam Conference and Plan of Care Update Date: 04/23/2021   Time: 1:35 PM    Patient Name: Sherri Dixon      Medical Record Number: [id number]  Date of Birth: [birth year] Sex: Female         Room/Bed: [id number] Payor Info: Payor: AET[employer] / Plan: [employer]ET[employer] NAP / Product Type: *No Product type* /    Admit Date/Time:  04/17/2021  5:19 PM  Primary Diagnosis:  Partial nontraumatic amputation of right foot [facility])  Hospital Problems: Principal Problem:   Partial nontraumatic amputation of right foot ([place]) Active Problems:   S/P transmetatarsal amputation of foot, right [facility])    Expected Discharge Date: Expected Discharge Date: 04/26/21  Team Members Present: Physician leading conference: Dr. Leeroy Cha Social Worker Present: Erlene Quan, BSW Nurse Present: Dorthula Nettles, RN PT Present: Magda Kiel, PT OT Present: Simonne Come, OT     Current Status/Progress Goal Weekly Team Focus  Bowel/Bladder   continent to B/B , Bm on 7/25  remmain continent  Assess Q shift and PRN   Swallow/Nutrition/ Hydration             ADL's   Supervision UB bathing, Max A LB bathing, Min A for UB dressing requiuring assist with posterior dressing, Max A for LB dressing, set-up for grooming  Min A  ADLs at sink, functional transfers, endurance/ activity tolerance, general strengthening   Mobility   min assist rolling and supine to sit.  mod assist sliding board transfers, propels wc up to 68f only straight path w/supervision, mod to turn/back.  supervision to CGA  transfer training, bed mobility, sitting  balance, global strengthening, sitting tolerance   Communication             Safety/Cognition/ Behavioral Observations            Pain   surgical pain managing with PRN oxy  pain free  Assess pain Q shift and PRN   Skin   surgical incision  in healing process  skin remain intact  Assess Q shift and PRN     Discharge Planning:      Team Discussion: Wants to  go home tomorrow. Had Covid exposure this past weekend. Hgb decreased, given 1 unit RBC's. Continent B/B, no pain or sleep issues reported. Partial amputation of right foot is wrapped. On isolation today. Patient on target to meet rehab goals: yes, min assist with RW. Slide board transfers. Goals supervision to contact guard. Working sitting balance. Supervision for upper body bathing. Max assist for lower body bathing. Min assist for upper body dressing. Min assist goals. +2 for scooting. Will need a slide board. Need husband to come in for family training.   *See Care Plan and progress notes for long and short-term goals.   Revisions to Treatment Plan:  Not at this time.  Teaching Needs: Family education, medication management, skin/wound care, slide board training, transfer training, gait training, balance training, endurance training, stair training, safety awareness.  Current Barriers to Discharge: Decreased caregiver support, Medical stability, Home enviroment access/layout, Wound care, Lack of/limited family support, Weight, Weight bearing restrictions, Medication compliance, and Behavior  Possible Resolutions to Barriers: Continue current medications, provide emotional support.     Medical Summary Current Status: depression, diaphoretic but feeling cold, symptomatic anemia, acute kidney injury  Barriers to Discharge: Behavior;Medical stability;Wound care  Barriers to Discharge Comments: depression, diaphoretic but feeling cold, symptomatic anemia, acute kidney injury Possible Resolutions to BCel[employer] CorporationFocus: daily wound care, test for COVID, repeat hemoglobin  outpatient, decrease Lasix to '20mg'$  twice per day   Continued Need for Acute Rehabilitation Level of Care: The patient requires daily medical management by a physician with specialized training in physical medicine and rehabilitation for the following reasons: Direction of a multidisciplinary physical rehabilitation program  to maximize functional independence : Yes Medical management of patient stability for increased activity during participation in an intensive rehabilitation regime.: Yes Analysis of laboratory values and/or radiology reports with any subsequent need for medication adjustment and/or medical intervention. : Yes   I attest that I was present, lead the team conference, and concur with the assessment and plan of the team.   Cristi Loron 04/23/2021, 2:02 PM

## 2021-04-24 ENCOUNTER — Inpatient Hospital Stay: Payer: Self-pay

## 2021-04-24 DIAGNOSIS — E43 Unspecified severe protein-calorie malnutrition: Secondary | ICD-10-CM | POA: Diagnosis not present

## 2021-04-24 DIAGNOSIS — T8130XA Disruption of wound, unspecified, initial encounter: Secondary | ICD-10-CM | POA: Diagnosis not present

## 2021-04-24 DIAGNOSIS — Z89431 Acquired absence of right foot: Secondary | ICD-10-CM | POA: Diagnosis not present

## 2021-04-24 DIAGNOSIS — E876 Hypokalemia: Secondary | ICD-10-CM | POA: Diagnosis not present

## 2021-04-24 LAB — GLUCOSE, CAPILLARY
Glucose-Capillary: 126 mg/dL — ABNORMAL HIGH (ref 70–99)
Glucose-Capillary: 161 mg/dL — ABNORMAL HIGH (ref 70–99)
Glucose-Capillary: 137 mg/dL — ABNORMAL HIGH (ref 70–99)
Glucose-Capillary: 134 mg/dL — ABNORMAL HIGH (ref 70–99)

## 2021-04-24 LAB — BASIC METABOLIC PANEL
Anion gap: 5 (ref 5–15)
BUN: 5 mg/dL — ABNORMAL LOW (ref 6–20)
CO2: 32 mmol/L (ref 22–32)
Calcium: 7.8 mg/dL — ABNORMAL LOW (ref 8.9–10.3)
Chloride: 102 mmol/L (ref 98–111)
Creatinine, Ser: 1.21 mg/dL — ABNORMAL HIGH (ref 0.44–1.00)
GFR, Estimated: 57 mL/min — ABNORMAL LOW (ref >60)
Glucose, Bld: 125 mg/dL — ABNORMAL HIGH (ref 70–99)
Potassium: 4.4 mmol/L (ref 3.5–5.1)
Sodium: 139 mmol/L (ref 135–145)

## 2021-04-24 NOTE — Plan of Care (Signed)
  Problem: RH Balance Goal: LTG Patient will maintain dynamic standing balance (PT) Description: LTG:  Patient will maintain dynamic standing balance with assistance during mobility activities (PT) Outcome: Not Applicable Flowsheets (Taken 04/24/2021 1752) LTG: Pt will maintain dynamic standing balance during mobility activities with:: (Goal discontinued:PAtient not planning to do any standing at home initially) --   Problem: Sit to Stand Goal: LTG:  Patient will perform sit to stand with assistance level (PT) Description: LTG:  Patient will perform sit to stand with assistance level (PT) Outcome: Not Applicable Flowsheets (Taken 04/24/2021 1752) LTG: PT will perform sit to stand in preparation for functional mobility with assistance level: (Goal discontinued due to NWB status patient not planning to do standing at home initially) --   Problem: RH Bed Mobility Goal: LTG Patient will perform bed mobility with assist (PT) Description: LTG: Patient will perform bed mobility with assistance, with/without cues (PT). Flowsheets (Taken 04/24/2021 1752) LTG: Pt will perform bed mobility with assistance level of: (patient wants to d/c home sooner so goals downgraded) Minimal Assistance - Patient > 75%   Problem: RH Bed to Chair Transfers Goal: LTG Patient will perform bed/chair transfers w/assist (PT) Description: LTG: Patient will perform bed to chair transfers with assistance (PT). Flowsheets (Taken 04/24/2021 1752) LTG: Pt will perform Bed to Chair Transfers with assistance level: (patient wants to go home sooner so goals downgraded) Minimal Assistance - Patient > 75%   Problem: RH Car Transfers Goal: LTG Patient will perform car transfers with assist (PT) Description: LTG: Patient will perform car transfers with assistance (PT). Flowsheets (Taken 04/24/2021 1752) LTG: Pt will perform car transfers with assist:: (patient wants to d/c home sooner so goals downgraded) Moderate Assistance - Patient  50 - 74%   Problem: RH Ambulation Goal: LTG Patient will ambulate in controlled environment (PT) Description: LTG: Patient will ambulate in a controlled environment, # of feet with assistance (PT). Outcome: Not Applicable Flowsheets (Taken 04/24/2021 1752) LTG: Pt will ambulate in controlled environ  assist needed:: (goal discontinued as pt not planning to walk at home) -- Goal: LTG Patient will ambulate in home environment (PT) Description: LTG: Patient will ambulate in home environment, # of feet with assistance (PT). Outcome: Not Applicable Flowsheets (Taken 04/24/2021 1752) LTG: Pt will ambulate in home environ  assist needed:: (goal discontinued as pt not planning to walk at home) --   Problem: RH Wheelchair Mobility Goal: LTG Patient will propel w/c in controlled environment (PT) Description: LTG: Patient will propel wheelchair in controlled environment, # of feet with assist (PT) Flowsheets (Taken 04/24/2021 1752) LTG: Pt will propel w/c in controlled environ  assist needed:: (goal downgraded due to pt planning to d/c home sooner) -- LTG: Propel w/c distance in controlled environment: 50'   Problem: RH Stairs Goal: LTG Patient will ambulate up and down stairs w/assist (PT) Description: LTG: Patient will ambulate up and down # of stairs with assistance (PT) Outcome: Not Applicable Flowsheets (Taken 04/24/2021 1752) LTG: Pt will ambulate up/down stairs assist needed:: (patient not able to stand with NWB on R so goal discontinued) --  Magda Kiel, PT

## 2021-04-24 NOTE — Progress Notes (Signed)
Physical Therapy Session Note  Patient Details  Name: Sherri Dixon MRN: [id number] Date of Birth: 1980-12-29  Today's Date: 04/24/2021 PT Individual Time: 1405-1500 PT Individual Time Calculation (min): 55 min   Short Term Goals: Week 1:  PT Short Term Goal 1 (Week 1): pt will transfer bed<>chair with LRAD and max of 1 PT Short Term Goal 2 (Week 1): pt will transfer sit<>stand with max A of 1 PT Short Term Goal 3 (Week 1): pt will perform WC mobility 41f with supervision  Skilled Therapeutic Interventions/Progress Updates:    Patient seen for caregiver training with spouse present due to pt desiring to go home ASAP.  Patient reports feeling ill due to medications making her feel sick.  She had IV fluids running as well.  Performed supine to sit with min A for L LE.  Patient transferred to w/c using step under L foot with spouse assisting (min/CGA) as he was able to recall training from OT session just prior.  Patient in w/c assisted to therapy gym.  Educating spouse on wheelchair parts and set up.  Demonstrated method to bump pt up one step in w/c.  Spouse able to return demonstrate with good technique.  Discussed likely not able to bump up 3 steps into home due to width of w/c not likely to get on her home entry steps.  Did give handout to spouse on method for bumping w/c up stairs with 2 person assist.  Patient assisted with spouse for simulated car transfer to approx 27" height (they have a [employer] Equinox).  Using slide board with +2 A pt able to safely transfer up to higher surface maintaining NWB on R.  Patient c/o increased pain with dependency so transferred back to w/c with mod A for safety and due to pants catching on edge of the board.  Spouse able to return demonstrate placing legrest on w/c.  Assisted in w/c to room.  Educated/reviewed with spouse pt's HEP as issued last session.  Patient transferred to bed via slide board with spouse assisting.  Noted poor body mechanics by spouse and  encouraged more squatting using legs to help to prevent back injury.  Patient sit to supine with spouse assisting with both legs.  [place] in trend[place]burg.  Patient left with call bell in reach, bed alarm active and spouse in the room.   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

## 2021-04-24 NOTE — Progress Notes (Signed)
Nurse brought medications in to administer for 2000. Patient vomited a large amount of undigested food x1. Patient stated "the thought of the medications made me feel nauseas." Nurse administered prn Zofran via PICC line to RUA. Nurse explained to patient that we would wait another 30-40 minutes after medication helps with nausea before attempting to administer po medications again. Patient in agreement with this.

## 2021-04-24 NOTE — Progress Notes (Signed)
Care team at patient bedside inserting new PICC line to [facility]

## 2021-04-24 NOTE — Progress Notes (Signed)
Nurse re-approached patient after Zofran administration with po medications. Patient stated that she was unable to tolerate po medications still. Patient stated that the Zofran decreased the nausea but still felt unable to tolerate medications. Nurse tech provided patient with graham crackers and diet soda. Nurse administered scheduled Rocephin IV via PICC to RUA.

## 2021-04-24 NOTE — Progress Notes (Signed)
Peripherally Inserted Central Catheter Placement  The IV Nurse has discussed with the patient and/or persons authorized to consent for the patient, the purpose of this procedure and the potential benefits and risks involved with this procedure.  The benefits include less needle sticks, lab draws from the catheter, and the patient may be discharged home with the catheter. Risks include, but not limited to, infection, bleeding, blood clot (thrombus formation), and puncture of an artery; nerve damage and irregular heartbeat and possibility to perform a PICC exchange if needed/ordered by physician.  Alternatives to this procedure were also discussed.  Bard Power PICC patient education guide, fact sheet on infection prevention and patient information card has been provided to patient /or left at bedside.    PICC Placement Documentation  PICC Single Lumen 04/13/21 Right Brachial 40 cm 0 cm (Active)  Indication for Insertion or Continuance of Line Prolonged intravenous therapies 04/24/21 1038  Exposed Catheter (cm) 3 cm 04/24/21 1038  Site Assessment Leaking;Other (Comment) 04/24/21 2109  Line Status Flushed;Saline locked;Leaking;Blood return noted 04/24/21 2109  Dressing Type Transparent;Securing device 04/24/21 2109  Dressing Status Clean;Dry;Intact 04/24/21 2109  Antimicrobial disc in place? Yes 04/24/21 2109  Safety Lock Not Applicable [id number]  Line Care Connections checked and tightened 04/24/21 2109  Line Adjustment (NICU/IV Team Only) No 04/22/21 0350  Dressing Intervention Dressing changed;Securement device changed;Antimicrobial disc changed 04/24/21 1038  Dressing Change Due 05/01/21 04/24/21 2109     PICC Single Lumen 04/24/21 Left Brachial 40 cm 0 cm (Active)  Indication for Insertion or Continuance of Line Prolonged intravenous therapies 04/24/21 2335  Exposed Catheter (cm) 0 cm 04/24/21 2335  Site Assessment Clean;Dry;Intact 04/24/21 2335  Line Status Saline locked;Flushed;Blood  return noted 04/24/21 2335  Dressing Type Transparent;Securing device 04/24/21 2335  Dressing Status Dry;Clean;Intact 04/24/21 2335  Antimicrobial disc in place? Yes 04/24/21 2335  Safety Lock Not Applicable [id number] [id number]  Line Care Connections checked and tightened 04/24/21 2335  Line Adjustment (NICU/IV Team Only) No 04/24/21 2335  Dressing Intervention New dressing 04/24/21 2335  Dressing Change Due 05/01/21 04/24/21 2335       Rosalio Macadamia Chenice 04/24/2021, 11:50 PM

## 2021-04-24 NOTE — Progress Notes (Signed)
PROGRESS NOTE   Subjective/Complaints: In good spirits. Denies pain. Asked when she would know when her infection was gone. Denies any fever, chills. Covid test neg  ROS: Patient denies fever, rash, sore throat, blurred vision, nausea, vomiting, diarrhea, cough, shortness of breath or chest pain, joint or back pain, headache, or mood change.    Objective:   No results found. Recent Labs    04/22/21 0346 04/23/21 0411  WBC 8.5  --   HGB 7.7* 8.6*  HCT 25.4* 27.7*  PLT 465*  --    Recent Labs    04/23/21 0411 04/24/21 0500  NA 139 139  K 4.1 4.4  CL 103 102  CO2 33* 32  GLUCOSE 95 125*  BUN 5* 5*  CREATININE 1.24* 1.21*  CALCIUM 7.8* 7.8*    Intake/Output Summary (Last 24 hours) at 04/24/2021 0856 Last data filed at 04/24/2021 0758 Gross per 24 hour  Intake 600 ml  Output 650 ml  Net -50 ml        Physical Exam: Vital Signs Blood pressure 140/70, pulse 70, temperature 98.4 F (36.9 C), temperature source Oral, resp. rate 16, height 5' 2"  (1.575 m), weight 121.3 kg, last menstrual period 04/05/2021, SpO2 98 %. Constitutional: No distress . Vital signs reviewed. HEENT: EOMI, oral membranes moist Neck: supple Cardiovascular: RRR without murmur. No JVD    Respiratory/Chest: CTA Bilaterally without wheezes or rales. Normal effort    GI/Abdomen: BS +, non-tender, non-distended Ext: no clubbing, cyanosis, or edema Psych: pleasant and cooperative  Skin: right foot just re-dressed. I did not remove Neurologic: Cranial nerves II through XII intact, motor strength is 5/5 in right and 4+ Left deltoid, bicep, tricep, grip, hip flexor, knee extensors, ankle dorsiflexor and plantar flexor Sensory: decreased LT bilateral distal LE   Musculoskeletal: Full range of motion in all 4 extremities, improved ROM tolerance, right foot/ankle       Assessment/Plan: 1. Functional deficits which require 3+ hours per day of  interdisciplinary therapy in a comprehensive inpatient rehab setting. Physiatrist is providing close team supervision and 24 hour management of active medical problems listed below. Physiatrist and rehab team continue to assess barriers to discharge/monitor patient progress toward functional and medical goals  Care Tool:  Bathing    Body parts bathed by patient: Buttocks, Front perineal area   Body parts bathed by helper: Front perineal area, Buttocks, Right upper leg, Left upper leg Body parts n/a: Right lower leg, Left lower leg (R transmet, L wrapped)   Bathing assist Assist Level: Contact Guard/Touching assist     Upper Body Dressing/Undressing Upper body dressing   What is the patient wearing?: Bra, Pull over shirt    Upper body assist Assist Level: Minimal Assistance - Patient > 75%    Lower Body Dressing/Undressing Lower body dressing      What is the patient wearing?: Incontinence brief, Pants     Lower body assist Assist for lower body dressing: Total Assistance - Patient < 25%     Toileting Toileting    Toileting assist Assist for toileting: Contact Guard/Touching assist     Transfers Chair/bed transfer  Transfers assist     Chair/bed transfer  assist level: Moderate Assistance - Patient 50 - 74%     Locomotion Ambulation   Ambulation assist   Ambulation activity did not occur: Safety/medical concerns (fatigue, weakness, dizziness)          Walk 10 feet activity   Assist  Walk 10 feet activity did not occur: Safety/medical concerns (fatigue, weakness, dizziness)        Walk 50 feet activity   Assist Walk 50 feet with 2 turns activity did not occur: Safety/medical concerns (fatigue, weakness, dizziness)         Walk 150 feet activity   Assist Walk 150 feet activity did not occur: Safety/medical concerns (fatigue, weakness, dizziness)         Walk 10 feet on uneven surface  activity   Assist Walk 10 feet on uneven  surfaces activity did not occur: Safety/medical concerns (fatigue, weakness, dizziness)         Wheelchair     Assist Will patient use wheelchair at discharge?: Yes Type of Wheelchair: Manual Wheelchair activity did not occur: Safety/medical concerns (fatigue, weakness, dizziness)  Wheelchair assist level: Supervision/Verbal cueing Max wheelchair distance: 30    Wheelchair 50 feet with 2 turns activity    Assist    Wheelchair 50 feet with 2 turns activity did not occur: Safety/medical concerns       Wheelchair 150 feet activity     Assist  Wheelchair 150 feet activity did not occur: Safety/medical concerns (fatigue, weakness, dizziness)       Blood pressure 140/70, pulse 70, temperature 98.4 F (36.9 C), temperature source Oral, resp. rate 16, height 5' 2"  (1.575 m), weight 121.3 kg, last menstrual period 04/05/2021, SpO2 98 %.    Medical Problem List and Plan: 1.  Impaired self care and mobility  secondary to Right trans met amp with NWB x 6 wks             -patient may  shower with RLE wrapped             -ELOS/Goals: 04/26/21 , minA goals, family ed Thursday   -Continue CIR therapies including PT, OT   2.  Antithrombotics: -DVT/anticoagulation:  Pharmaceutical: Xarelto             -antiplatelet therapy: N/AA 3. Pain Management: Denies pain.  4. Mood: LCSW to follow for evaluation and support             -antipsychotic agents: N/AA 5. Neuropsych: This patient is capable of making decisions on her own behalf. 6. Skin/Wound Care:   oil emersion dressing, 4x4 gauze and kerlix to wound daily. Spoke with nursing 7. Fluids/Electrolytes/Nutrition: Monitor intake/output.  Check c-Met in AM. 8.  Right foot infection: Continue rocephin and flagyl for 6 weeks with end date 08/26. 9.  T2DM: Hemoglobin A1c-8.7.   CBG (last 3)  Recent Labs    04/23/21 1124 04/23/21 2106 04/24/21 0540  GLUCAP 134* 112* 126*  Controlled 7/27- can just stop cbg's   10.   Microcytic anemia: Monitor H&H with serial checks.  Repeat CBC in AM. 11.  Acute kidney injury: Likely in setting of SIRS/infection and is slowly resolving             -- Monitor renal status with routine checks. 12.  History of severe malnutrition: Albumin 2.1 in May.  1.0 7/21  -prealbumin quite low Monday---continue to push po intake            -protein supplement, RD f/u 13.  Super morbid obesity:  BMI 54--educate patient on weight loss to promote health and mobility.             --  consult dietitian for dietary education. 14.  Chronic diastolic CHF: Monitor for signs of overload.  Check weights daily.   --Continue metoprolol, hydralazine, furosemide and Crestor. Pt looks dry , BUN nl pt states she take lasix 61m only 1 per day will reduce to 443mBID and monitor  Filed Weights   04/21/21 0408 04/22/21 0400 04/24/21 0535  Weight: 122.6 kg 121.3 kg 121.3 kg     cont on lower dose Lasix 4016mID ( home dose per pt is 38m66m)  -7/27 weight plateaued the last 2 days.  15.  HTN: Reports of dizziness with therapy.  Monitor for systolic changes. 16. Vitamin D deficiency: start ergocalciferol 50,000U weekly for 7 weeks 17. Hypokalemia: improved to 4.1 with supplementation 18. Hypomagnesemia: most recent labs normal -continue oral supplement .  19. Symptomatic anemia: improved to 8.6 after 1U on 7/25 20. AKI: see #14 21. Diaphoretic, feeling cold, COVID exposure this weekend: asymptomatic 7/27  -covid negative     LOS: 7 days A FACE TO FACE EVALUATION WAS PERFORMED  ZachMeredith Staggers7/2022, 8:56 AM

## 2021-04-24 NOTE — Progress Notes (Signed)
Occupational Therapy Note  Patient Details  Name: Eleana Garee MRN: [id number] Date of Birth: 1981-08-05  The following equipment is recommended by occupational therapy to increase pt's ability to perform ADL and decr burden of care: **Bariatric drop arm BSC to facilitate safe transfers to/from toilet due to pt body habitus, NWB through RLE, and use of slide board       Simonne Come 04/24/2021, 7:45 AM

## 2021-04-24 NOTE — Progress Notes (Signed)
Patient ID: Sherri Dixon, female   DOB: 10-31-1980, 40 y.o.   MRN: [id number]  Wheelchair ordered through [employer]

## 2021-04-24 NOTE — Progress Notes (Signed)
Consulted to check PICC due to leaked. RN to patient room. R SL PICC flushed with 74m of saline, no leak noted from catheter while flushing. PICC noted to be out 2cm, biopatch saturated with fluids and small amount of fluid coming out from PICC site.PICC dressing with biopatch applied. Primary RN making round with the team made aware.Will follow up.

## 2021-04-24 NOTE — Progress Notes (Signed)
Patient ID: Sherri Dixon, female   DOB: 19-Apr-1981, 40 y.o.   MRN: [id number]  [facility] options presented to patient  Erlene Quan, [place]

## 2021-04-24 NOTE — Progress Notes (Signed)
Occupational Therapy Session Note  Patient Details  Name: Sherri Dixon MRN: [id number] Date of Birth: [birth year]  Today's Date: 04/24/2021 Session 1 OT Individual Time: 6948-5462 OT Individual Time Calculation (min): 63 min   Session 2 OT Individual Time: 7035-0093 OT Individual Time Calculation (min): 33 min   Session 3 OT Individual Time: 8182-9937 OT Individual Time Calculation (min): 53 min    Short Term Goals: Week 1:  OT Short Term Goal 1 (Week 1): patient will complete lower body bathing and dressing at bed level with mod/max A OT Short Term Goal 2 (Week 1): patient will complete SB transfer to/from level surfaces with mod A of 2 OT Short Term Goal 3 (Week 1): patient will complete toileting on drop arm commode with mod a of 2  Skilled Therapeutic Interventions/Progress Updates:  Session 1: Met pt lying in bed with HOB elevated, bed alarm on, pt agreed to session. Treatment session was focused on self-care retraining and functional transfers. Pt reported wanting to sitting EOB to complete bathing and dressing due to feeling wet in bed. Pt was supervision for bed mobility > EOB. Pt managed UB clothing with Min A. Pt completed UB bathing with supervision. Pt slide board transferred to w/c <> EOB with Min A to change sheets.OT and OTS educated pt on leaning forward then moving hips back to scoot back into furniture. Pt was Max A for perineal care rolling in bed. OTS encouraged pt to verbalize pro's and con's for different methods for LB dressing, pt able to identify a few alternatives to decrease burden of care and safety. Due to time constraints, pt left in bed no LB dressing and no grooming tasks completed, will donn dressing and bring to sink for grooming tasks during 2/3 sessions. Throughout session, pt required many rest breaks due to decreased activity tolerance, OTS educated pt on further energy conservation techniques during everyday tasks. Pt was left lying supine with HOB elevated,  bed alarm on, all needs met.   Session 2: Met pt lying in bed with HOB elevated, bed alarm on, pt agreed to session. Treatment session was focused on LB dressing and functional transfers. OT and OTS encouraged pt to problem solve LB dressing process, with good carryover. Pt was supervision for bed mobility. Pt was Mod A for LB dressing, requiring assist with bringing pants over hips via rolling. Pt able to transfer with slide board > w/c with Min A. Pt required physical assist and cues for scooting hips back in chair for safety. Due to time constraints grooming tasks not completed, educated pt on w/c positioning at sink. Pt was left sitting in w/c, seat alarm on, all needs met.   Session 3: Met pt lying in bed with HOB elevated, husband present, pt agreed to session. Treatment session was focused on family education. Pt reported 10/10 pain in R foot, pain medication given prior to therapy. Pt reported beginning to feel "loopy" from medications but able to participate in session. OTS educated husband on status of ADLs and emphasized allowing pt to complete as many occupations as independently as possible, recommending LB bathing and dressing bed level to decrease burden of care. OT educated husband on bathroom equipment for d/c. Pt requested to use extra wide drop arm BSC. Pt was supervision for bed mobility > EOB. Pt slide board transferred to [facility] with Min A. Pt was Mod A for LB clothing management via lateral leans and required cues for further leaning and assist for bringing pants fully off  hips. Pt urinated and completed hygiene sitting. Husband assisted with clothing management, but pt is able to complete 50% of task. Husband assist with slide board transfer > EOB with CGA, OT provided cues for slide board placement. OT educated pt on alternative transfers to ensure WB precautions and conserve energy by placing a exercise step under feet, did not attempt made PT aware. Pt was left lying supine in bed, bed  alarm on, all needs met.   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 Vital Signs: Therapy Vitals Temp: 97.8 F (36.6 C) Temp Source: Oral Pulse Rate: 64 Resp: 18 BP: (!) 148/80 Patient Position (if appropriate): Sitting Oxygen Therapy SpO2: 100 % O2 Device: Room Air Pain: Pain Assessment Pain Scale: 0-10 Pain Score: 0-No pain Faces Pain Scale: No hurt Pain Type: Acute pain Pain Location: Foot Pain Orientation: Left Pain Descriptors / Indicators: Aching Pain Frequency: Intermittent Pain Onset: Gradual Patients Stated Pain Goal: 2 Pain Intervention(s): Medication (See eMAR);Guided imagery;Relaxation PAINAD (Pain Assessment in Advanced Dementia) Breathing: normal Negative Vocalization: none Facial Expression: smiling or inexpressive Body Language: relaxed Consolability: no need to console PAINAD Score: 0   Therapy/Group: Individual Therapy  Bayard Males 04/24/2021, 1:58 PM

## 2021-04-24 NOTE — Progress Notes (Signed)
Patient ID: Sherri Dixon, female   DOB: 06-19-1981, 40 y.o.   MRN: [id number]  Drop arm BAS and TTB ordered through [facility]  Erlene Quan, [place]

## 2021-04-25 ENCOUNTER — Other Ambulatory Visit (HOSPITAL_COMMUNITY): Payer: Self-pay

## 2021-04-25 DIAGNOSIS — T8130XA Disruption of wound, unspecified, initial encounter: Secondary | ICD-10-CM | POA: Diagnosis not present

## 2021-04-25 DIAGNOSIS — E43 Unspecified severe protein-calorie malnutrition: Secondary | ICD-10-CM | POA: Diagnosis not present

## 2021-04-25 DIAGNOSIS — Z89431 Acquired absence of right foot: Secondary | ICD-10-CM | POA: Diagnosis not present

## 2021-04-25 DIAGNOSIS — E876 Hypokalemia: Secondary | ICD-10-CM | POA: Diagnosis not present

## 2021-04-25 LAB — GLUCOSE, CAPILLARY
Glucose-Capillary: 106 mg/dL — ABNORMAL HIGH (ref 70–99)
Glucose-Capillary: 141 mg/dL — ABNORMAL HIGH (ref 70–99)
Glucose-Capillary: 172 mg/dL — ABNORMAL HIGH (ref 70–99)
Glucose-Capillary: 111 mg/dL — ABNORMAL HIGH (ref 70–99)

## 2021-04-25 MED ORDER — ZINC SULFATE 220 (50 ZN) MG PO TABS
220.0000 mg | ORAL_TABLET | Freq: Every day | ORAL | 0 refills | Status: DC
  Filled 2021-04-25: qty 30, 30d supply, fill #0

## 2021-04-25 MED ORDER — SODIUM CHLORIDE 0.9 % IV SOLN: 2.0000 g | INTRAVENOUS | 0 refills | Status: DC

## 2021-04-25 MED ORDER — POTASSIUM CHLORIDE CRYS ER 10 MEQ PO TBCR
30.0000 meq | EXTENDED_RELEASE_TABLET | Freq: Two times a day (BID) | ORAL | 0 refills | Status: DC
Start: 2021-04-25 — End: 2021-10-08
  Filled 2021-04-25: qty 180, 30d supply, fill #0

## 2021-04-25 MED ORDER — PANTOPRAZOLE SODIUM 40 MG PO TBEC
40.0000 mg | DELAYED_RELEASE_TABLET | Freq: Every day | ORAL | 0 refills | Status: DC
Start: 2021-04-25 — End: 2022-05-23
  Filled 2021-04-25: qty 30, 30d supply, fill #0

## 2021-04-25 MED ORDER — DOCUSATE SODIUM 100 MG PO CAPS
200.0000 mg | ORAL_CAPSULE | Freq: Every day | ORAL | 0 refills | Status: DC
  Filled 2021-04-25: qty 60, 30d supply, fill #0

## 2021-04-25 MED ORDER — OXYCODONE HCL 5 MG PO TABS
5.0000 mg | ORAL_TABLET | Freq: Three times a day (TID) | ORAL | 0 refills | Status: DC | PRN
  Filled 2021-04-25: qty 40, 7d supply, fill #0

## 2021-04-25 MED ORDER — CEFTRIAXONE IV (FOR PTA / DISCHARGE USE ONLY): 2.0000 g | INTRAVENOUS | 0 refills | Status: AC

## 2021-04-25 MED ORDER — MAGNESIUM OXIDE 400 MG PO TABS
400.0000 mg | ORAL_TABLET | Freq: Every day | ORAL | 0 refills | Status: DC
Start: 2021-04-26 — End: 2021-10-08
  Filled 2021-04-25: qty 30, 30d supply, fill #0

## 2021-04-25 MED ORDER — ACETAMINOPHEN 325 MG PO TABS: 325.0000 mg | ORAL_TABLET | ORAL | Status: DC | PRN

## 2021-04-25 MED ORDER — FUROSEMIDE 20 MG PO TABS
20.0000 mg | ORAL_TABLET | Freq: Two times a day (BID) | ORAL | 0 refills | Status: DC
  Filled 2021-04-25: qty 60, 30d supply, fill #0

## 2021-04-25 MED ORDER — JUVEN PO PACK
1.0000 | PACK | Freq: Two times a day (BID) | ORAL | 0 refills | Status: DC
  Filled 2021-04-25: qty 60, 30d supply, fill #0

## 2021-04-25 MED ORDER — METRONIDAZOLE 500 MG PO TABS
500.0000 mg | ORAL_TABLET | Freq: Three times a day (TID) | ORAL | 0 refills | Status: DC
  Filled 2021-04-25: qty 86, 29d supply, fill #0

## 2021-04-25 MED ORDER — METOPROLOL SUCCINATE ER 100 MG PO TB24
100.0000 mg | ORAL_TABLET | Freq: Every day | ORAL | 0 refills | Status: DC
  Filled 2021-04-25: qty 30, 30d supply, fill #0

## 2021-04-25 MED ORDER — ONDANSETRON HCL 4 MG PO TABS
4.0000 mg | ORAL_TABLET | Freq: Every day | ORAL | 1 refills | Status: AC | PRN
  Filled 2021-04-25: qty 30, 30d supply, fill #0

## 2021-04-25 NOTE — Progress Notes (Signed)
Pictures of wound taken today as she was awaiting dressing application. She continues to have min to moderate amount of serosanguinous drainage per Dr. Naaman Plummer. Reached out to Dr. Lucia Gaskins to confirm dressing change orders as set for d/c tomorrow.   Anterior aspect  Inferior aspect:

## 2021-04-25 NOTE — Progress Notes (Addendum)
Patient ID: Sherri Dixon, female   DOB: [birth year], 40 y.o.   MRN: [id number]  SW reached out to IV infusion. Pt IV ABX education scheduled for today  Erlene Quan, [place]

## 2021-04-25 NOTE — Progress Notes (Signed)
Patient's husband was trained to do the dressing change by the Probation officer. He had no difficulty with the procedure and stated he felt comfortable doing the dressing change.

## 2021-04-25 NOTE — Progress Notes (Signed)
PROGRESS NOTE   Subjective/Complaints: No new issues. Wound still with a regular amount of s/s drainage. Dressing changed this morning and already visible bloody discharge  ROS: Patient denies fever, rash, sore throat, blurred vision, nausea, vomiting, diarrhea, cough, shortness of breath or chest pain,  headache, or mood change. .    Objective:   [place] EKG SITE RITE  Result Date: 04/24/2021 If Site Rite image not attached, placement could not be confirmed due to current cardiac rhythm.  Recent Labs    04/23/21 0411  HGB 8.6*  HCT 27.7*   Recent Labs    04/23/21 0411 04/24/21 0500  NA 139 139  K 4.1 4.4  CL 103 102  CO2 33* 32  GLUCOSE 95 125*  BUN 5* 5*  CREATININE 1.24* 1.21*  CALCIUM 7.8* 7.8*    Intake/Output Summary (Last 24 hours) at 04/25/2021 1144 Last data filed at 04/25/2021 0914 Gross per 24 hour  Intake 70 ml  Output --  Net 70 ml        Physical Exam: Vital Signs Blood pressure 136/86, pulse 77, temperature 98.6 F (37 C), temperature source Oral, resp. rate 16, height 5' 2"  (1.575 m), weight 121.6 kg, last menstrual period 04/05/2021, SpO2 97 %. Constitutional: No distress . Vital signs reviewed. HEENT: EOMI, oral membranes moist Neck: supple Cardiovascular: RRR without murmur. No JVD    Respiratory/Chest: CTA Bilaterally without wheezes or rales. Normal effort    GI/Abdomen: BS +, non-tender, non-distended Ext: no clubbing, cyanosis, or edema Psych: pleasant and cooperative  Skin: right foot incision slightly moist, still with s/s discharge, some dried blood surrounding Neurologic: Cranial nerves II through XII intact, motor strength is 5/5 in right and 4+ Left deltoid, bicep, tricep, grip, hip flexor, knee extensors, ankle dorsiflexor and plantar flexor Sensory: decreased LT bilateral distal LE   Musculoskeletal: Full range of motion in all 4 extremities, improved ROM tolerance, right  foot/ankle       Assessment/Plan: 1. Functional deficits which require 3+ hours per day of interdisciplinary therapy in a comprehensive inpatient rehab setting. Physiatrist is providing close team supervision and 24 hour management of active medical problems listed below. Physiatrist and rehab team continue to assess barriers to discharge/monitor patient progress toward functional and medical goals  Care Tool:  Bathing    Body parts bathed by patient: Right arm, Left arm, Chest, Abdomen, Left upper leg, Right upper leg, Face, Left lower leg, Front perineal area, Buttocks   Body parts bathed by helper: Buttocks, Front perineal area Body parts n/a: Right lower leg   Bathing assist Assist Level: Supervision/Verbal cueing     Upper Body Dressing/Undressing Upper body dressing   What is the patient wearing?: Pull over shirt    Upper body assist Assist Level: Supervision/Verbal cueing    Lower Body Dressing/Undressing Lower body dressing      What is the patient wearing?: Pants, Underwear/pull up     Lower body assist Assist for lower body dressing: Minimal Assistance - Patient > 75%     Toileting Toileting    Toileting assist Assist for toileting: Moderate Assistance - Patient 50 - 74%     Transfers Chair/bed transfer  Transfers assist     Chair/bed transfer assist level: Minimal Assistance - Patient > 75%     Locomotion Ambulation   Ambulation assist   Ambulation activity did not occur: Safety/medical concerns          Walk 10 feet activity   Assist  Walk 10 feet activity did not occur: Safety/medical concerns (fatigue, weakness, dizziness)        Walk 50 feet activity   Assist Walk 50 feet with 2 turns activity did not occur: Safety/medical concerns (fatigue, weakness, dizziness)         Walk 150 feet activity   Assist Walk 150 feet activity did not occur: Safety/medical concerns (fatigue, weakness, dizziness)         Walk 10  feet on uneven surface  activity   Assist Walk 10 feet on uneven surfaces activity did not occur: Safety/medical concerns (fatigue, weakness, dizziness)         Wheelchair     Assist Will patient use wheelchair at discharge?: Yes Type of Wheelchair: Manual Wheelchair activity did not occur: Safety/medical concerns (fatigue, weakness, dizziness)  Wheelchair assist level: Supervision/Verbal cueing Max wheelchair distance: 30    Wheelchair 50 feet with 2 turns activity    Assist    Wheelchair 50 feet with 2 turns activity did not occur: Safety/medical concerns       Wheelchair 150 feet activity     Assist  Wheelchair 150 feet activity did not occur: Safety/medical concerns (fatigue, weakness, dizziness)       Blood pressure 136/86, pulse 77, temperature 98.6 F (37 C), temperature source Oral, resp. rate 16, height 5' 2"  (1.575 m), weight 121.6 kg, last menstrual period 04/05/2021, SpO2 97 %.    Medical Problem List and Plan: 1.  Impaired self care and mobility  secondary to Right trans met amp with NWB x 6 wks             -patient may  shower with RLE wrapped             -ELOS/Goals: 04/26/21 , minA goals, family ed today   -Continue CIR therapies including PT, OT   2.  Antithrombotics: -DVT/anticoagulation:  Pharmaceutical: Xarelto             -antiplatelet therapy: N/AA 3. Pain Management: Denies pain.  4. Mood: LCSW to follow for evaluation and support             -antipsychotic agents: N/AA 5. Neuropsych: This patient is capable of making decisions on her own behalf. 6. Skin/Wound Care:   oil emersion dressing, 4x4 gauze and kerlix to wound daily.    -PA forwarded photos of wound to ortho to see if they prefer any dressing changes at discharge. Will use just dry dressings for now. 7. Fluids/Electrolytes/Nutrition: Monitor intake/output.  Check c-Met in AM. 8.  Right foot infection: Continue rocephin and flagyl for 6 weeks with end date 08/26. 9.   T2DM: Hemoglobin A1c-8.7.   CBG (last 3)  Recent Labs    04/24/21 2108 04/25/21 0612 04/25/21 1141  GLUCAP 134* 106* 141*  Controlled     10.  Microcytic anemia: Monitor H&H with serial checks.  Repeat CBC in AM. 11.  Acute kidney injury: Likely in setting of SIRS/infection and is slowly resolving             -- Monitor renal status with routine checks. 12.  History of severe malnutrition: Albumin 2.1 in May.  1.0 7/21  -prealbumin quite low  Monday---continue to push po intake            -protein supplement, RD f/u 13.  Super morbid obesity: BMI 54--educate patient on weight loss to promote health and mobility.             --  consult dietitian for dietary education. 14.  Chronic diastolic CHF: Monitor for signs of overload.  Check weights daily.   --Continue metoprolol, hydralazine, furosemide and Crestor. Pt looks dry , BUN nl pt states she take lasix 71m only 1 per day will reduce to 490mBID and monitor  Filed Weights   04/22/21 0400 04/24/21 0535 04/25/21 0623  Weight: 121.3 kg 121.3 kg 121.6 kg     cont on lower dose Lasix 405mID ( home dose per pt is 41m77m)  -7/28 weight plateaued this week 15.  HTN: Reports of dizziness with therapy.  Monitor for systolic changes. 16. Vitamin D deficiency: start ergocalciferol 50,000U weekly for 7 weeks 17. Hypokalemia: improved to 4.1 with supplementation 18. Hypomagnesemia: most recent labs normal -continue oral supplement .  19. Symptomatic anemia: improved to 8.6 after 1U on 7/25 20. AKI: see #14       LOS: 8 days A FACE TO FACE EVALUATION WAS PERFORMED  ZachMeredith Staggers8/2022, 11:44 AM

## 2021-04-25 NOTE — Progress Notes (Signed)
Patient ID: Sherri Dixon, female   DOB: 12-16-1980, 40 y.o.   MRN: [id number]  Patient declined by [facility].

## 2021-04-25 NOTE — Progress Notes (Signed)
Physical Therapy Note  Patient Details  Name: Sherri Dixon MRN: [id number] Date of Birth: 15-Dec-1980 Today's Date: 04/25/2021    Pt sitting in bed with HOB elevated. Pt declining physical therapy this session due to "feeling very out of it" and "whoozy" stating she believes "the medications are making me loopy." Pt also reports she is waiting on RLE dressing change prior to participating in therapy. Pt missed 30 minutes of skilled therapy. Nursing team made aware and will continue to monitor.   Jennings Corado, SPT 04/25/2021, 9:26 AM

## 2021-04-25 NOTE — Progress Notes (Signed)
Physical Therapy Discharge Summary  Patient Details  Name: Sherri Dixon MRN: [id number] Date of Birth: [birth year]  Today's Date: 04/25/2021 PT Individual Time: 1400-1500 PT Individual Time Calculation (min): 60 min     Skilled Therapeutic Interventions/Progress Updates:  Patient in supine with spouse present to continue caregiver education.  Spouse provided measurements of home, but admits he measured doorways from frame, not opening so likely they are wider than they actually are.  Noted equipment in the room for home with 20x16 w/c and standard drop arm BSC.  Discussed with family w/c and decided to try to see if it would work.  Patient performed slide board transfer to w/c with spouse assisting with min A and increased time.  Once pt in chair applied standard legrests, but she c/o R foot pain with dependency so placed ELR from w/c in the room and reported better.  Several times in session spent assisting pt to scoot back in w/c due to sliding out of chair.  Patient transported down elevator to ortho gym to practice with car simulator.  Upon exit from elevator noted w/c tipping forwrad with pt's weight more anterior to axle on w/c.  Discussed with pt and spouse likely need at least 18" deep chair and likely 22" wide if available.  SW made aware needing equipment changed to at least 20x18 if not 22x18 and needs bariatric drop arm BSC as well as slide board for home.  Patient performed car transfer to simulator at 26" height as pt spouse had measured their car.  Completed with +2 A and step under her L foot to assist with scooting back.  Patient and spouse agree may be best to get home with ambulance transport since so difficult especially with negotiating steps at entry.  Patient assisted to hallway and performed w/c mobility x 50' in hallway and in ADL apartment with S and cues after obtaining w/c gloves.  Patient assisted to room and spouse performed w/c to bed transfer with good technique using slide  board.  Patient sit to supine with spouse assisting mod A for both feet.  Scooted up in bed with bed tilted, pt using rail and pushing in L foot with min A.  Patient left with spouse in the room, bed alarm set and RN in the room.    Patient has met 4 of 5 long term goals due to increased strength and ability to compensate for deficits.  Patient to discharge at a wheelchair level [place].   Patient's care partner is independent to provide the necessary physical assistance at discharge.  Reasons goals not met: Patient needing +2 A for car transfers for safety due to height of car seat and limited strength with NWB status on R LE.  Patient eager to d/c prior to program completion due to wanting to get home and caregiver education completed with pt's spouse.  Recommendation:  Patient will benefit from ongoing skilled PT services in home health setting to continue to advance safe functional mobility, address ongoing impairments in strength, endurance, balance, activity tolerance, and minimize fall risk.  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 Pain Pain Assessment Pain Scale: 0-10 Pain Score: 0-No pain Faces Pain Scale: Hurts little more Pain Type: Acute pain Pain Location: Foot Pain Orientation: Right Pain Descriptors / Indicators: Aching  Pain Onset: With Activity Pain Intervention(s): Repositioned;Elevated extremity Vision/Perception  Perception Perception: Within Functional Limits Praxis Praxis: Intact  Cognition Overall Cognitive Status: Within Functional Limits for tasks assessed Arousal/Alertness: Awake/alert Orientation Level: Oriented X4 Safety/Judgment: Appears  intact Sensation Sensation Light Touch: Appears Intact Hot/Cold: Not tested Proprioception: Not tested Stereognosis: Not tested Coordination Gross Motor Movements are Fluid and Coordinated: No Fine Motor Movements are Fluid and Coordinated: Yes Coordination and Movement Description: grossly uncoordinated due to generalized weakness/deconditioning, pain, fatigue, and body habitus Finger Nose Finger Test: [facility] bilaterally Motor  Motor Motor: Other (comment);Abnormal postural alignment and control Motor - Discharge Observations: decreased coordination with h/o L hemiparesis, generalized weakness and increased body habitus  Mobility Bed Mobility Bed Mobility: Rolling Right;Rolling Left;Supine to Sit;Sit to Supine;Scooting to [facility];Sitting - Scoot to [employer] of Bed Rolling Right: Supervision/verbal cueing Rolling Left: Supervision/Verbal cueing Supine to Sit: Supervision/Verbal cueing Sitting - Scoot to Edge of Bed: Minimal Assistance - Patient > 75% Sit to Supine: Moderate Assistance - Patient 50-74% Scooting to [facility]: Supervision/Verbal Cueing Transfers Transfers: Lateral/Scoot Transfers Lateral/Scoot Transfers: Minimal Assistance - Patient > 75% Transfer (Assistive device): Other (Comment) (slide board) Locomotion  Gait Ambulation: No Gait Gait: No Stairs / Additional Locomotion Stairs: No Wheelchair Mobility Wheelchair Mobility: Yes Wheelchair Assistance: Chartered loss adjuster: Both upper extremities Wheelchair Parts Management: Needs assistance Distance: 70'  Trunk/Postural Assessment  Cervical Assessment Cervical Assessment: Exceptions to [facility] (forward head) Thoracic Assessment Thoracic Assessment: Exceptions to [facility] (rounded shoulders) Lumbar Assessment Lumbar Assessment: Exceptions to [facility] (weak core with post tilt in sitting) Postural Control Postural Control: Deficits on evaluation (generalized weakness limiting postural stability)   Balance Balance Balance Assessed: Yes Static Sitting Balance Static Sitting - Balance Support: Feet supported Static Sitting - Level of Assistance: 5: Stand by assistance Dynamic Sitting Balance Dynamic Sitting - Balance Support: Bilateral upper extremity supported;Feet supported Dynamic Sitting - Level of Assistance: 5: Stand by assistance Dynamic Sitting - Balance Activities: Reaching for objects Extremity Assessment      RLE Assessment RLE Assessment: Exceptions to [facility] Passive Range of Motion (PROM) Comments: WFL Active Range of Motion (AROM) Comments: limited due to weakness at hip General Strength Comments: hip flexion 2+/5, knee extension 4/5, foot/ankle limited by pain LLE Assessment LLE Assessment: Exceptions to [facility] Passive Range of Motion (PROM) Comments: WFL Active Range of Motion (AROM) Comments: limited due to weakness in hip General Strength Comments: hip flexion 2/5, knee extension 4-/5, ankle DF 4-/5    Jamison Oka, PT 04/25/2021, 5:38 PM

## 2021-04-25 NOTE — Progress Notes (Signed)
Patient ID: Sherri Dixon, female   DOB: 02/25/1981, 40 y.o.   MRN: [id number]  Patient declined by [facility]

## 2021-04-25 NOTE — Progress Notes (Signed)
OPAT Order Details             Outpatient Parenteral Antibiotic Therapy Consult  Until discontinued       Provider:  (Not yet assigned)  Question Answer Comment  Antibiotic Ceftriaxone (Rocephin) IVPB   Indications for use osteomyelitis   End Date 05/26/2021         .Outpatient Parenteral Antibiotic Therapy Consult  Until discontinued       Provider:  (Not yet assigned)  Question Answer Comment  Antibiotic Ceftriaxone (Rocephin) IVPB   Indications for use osteomyelitis   End Date 05/24/2021                 Thank you for allowing pharmacy to be a part of this patient's care.  Ardyth Harps, PharmD Clinical Pharmacist

## 2021-04-25 NOTE — Plan of Care (Signed)
  Problem: RH Bed Mobility Goal: LTG Patient will perform bed mobility with assist (PT) Description: LTG: Patient will perform bed mobility with assistance, with/without cues (PT). Outcome: Completed/Met   Problem: RH Bed to Chair Transfers Goal: LTG Patient will perform bed/chair transfers w/assist (PT) Description: LTG: Patient will perform bed to chair transfers with assistance (PT). Outcome: Completed/Met   Problem: RH Car Transfers Goal: LTG Patient will perform car transfers with assist (PT) Description: LTG: Patient will perform car transfers with assistance (PT). Outcome: Not Met (add Reason) Flowsheets (Taken 04/25/2021 1739) LTG: Pt will perform car transfers with assist:: (Patient needs +2 for safe car transfers due to weakness, NWB status) --   Problem: RH Wheelchair Mobility Goal: LTG Patient will propel w/c in controlled environment (PT) Description: LTG: Patient will propel wheelchair in controlled environment, # of feet with assist (PT) Outcome: Completed/Met Goal: LTG Patient will propel w/c in home environment (PT) Description: LTG: Patient will propel wheelchair in home environment, # of feet with assistance (PT). Outcome: Completed/Met  Magda Kiel, PT

## 2021-04-25 NOTE — Progress Notes (Signed)
Patient ID: Sherri Dixon, female   DOB: 25-Mar-1981, 40 y.o.   MRN: [id number]  Patient declined by [facility].  SW following up with CM to see if patient has been participating in Wound Care. Waiting for follow up. SW spoke with Pam with IV infusion. Pam is going to reach out to [facility] to assist with IV. Patient will be required to be independent with wound care. If patient is independent sw will arrange OP therapies.   SW reached out to [facility] and [facility] to see about potentially covering just therapies and [facility] cover IV. SW waiting for follow up.  [place], [place]

## 2021-04-25 NOTE — Progress Notes (Signed)
Occupational Therapy Session Note  Patient Details  Name: Sherri Dixon MRN: [id number] Date of Birth: 12/08/1980  Today's Date: 04/25/2021 OT Individual Time: 0700-0757 OT Individual Time Calculation (min): 57 min    Short Term Goals: Week 1:  OT Short Term Goal 1 (Week 1): patient will complete lower body bathing and dressing at bed level with mod/max A OT Short Term Goal 2 (Week 1): patient will complete SB transfer to/from level surfaces with mod A of 2 OT Short Term Goal 3 (Week 1): patient will complete toileting on drop arm commode with mod a of 2  Skilled Therapeutic Interventions/Progress Updates:     Pt received in bed with no pain. Pt agreeable to bed level ADL/EOB ADL for wash up  ADL: Pt requires increased time to "get myself together and wake up." Pt completes bathing with Supervision and coaching for lateral leans to wash buttocks Pt completes UB dressing with supervision Pt completes LB dressing with MINA for pulling pants up hips (pt able ot do majority- needs A for 25% of component) Pt completes footwear with supervision to don L sock Pt completes oral care seated EOB with MOD I.   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 General:   Vital Signs: Therapy Vitals Temp: 98.6 F (37 C) Temp Source: Oral Pulse Rate: 77 Resp: 16 BP: 136/88 Patient Position (if appropriate): Lying Oxygen Therapy SpO2: 97 % O2 Device: Room Air Pain:   ADL: ADL Eating: Set up Where Assessed-Eating: Wheelchair Grooming: Setup Where Assessed-Grooming: Wheelchair Upper Body Bathing: Minimal assistance Where Assessed-Upper Body Bathing: Wheelchair Lower Body Bathing: Dependent Where  Assessed-Lower Body Bathing: Bed level Upper Body Dressing: Setup Where Assessed-Upper Body Dressing: Wheelchair Lower Body Dressing: Dependent Where Assessed-Lower Body Dressing: Bed level Toileting: Dependent Where Assessed-Toileting: Bed level Vision   Perception    Praxis   Exercises:   Other Treatments:     Therapy/Group: Individual Therapy  Tonny Branch 04/25/2021, 6:47 AM

## 2021-04-25 NOTE — Progress Notes (Signed)
Patient ID: Sherri Dixon, female   DOB: 31-Mar-1981, 40 y.o.   MRN:   [facility] referral sent to [facility]

## 2021-04-25 NOTE — Discharge Instructions (Addendum)
Inpatient Rehab Discharge Instructions  Zellah Blansett Discharge date and time: 04/26/21  Activities/Precautions/ Functional Status: Activity: no lifting, driving, or strenuous exercise till cleared by Dr. Lucia Gaskins Diet: Heart Healthy/Carb Modified.  Wound Care:  wash with soap and water, pat dry and apply  dry dressing. Contact Dr. Lucia Gaskins  if you develop any problems with your incision/wound--redness, swelling, increase in pain, drainage or if you develop fever or chills.     Functional status:  ___ No restrictions     ___ Walk up steps independently __X_ 24/7 supervision/assistance   ___ Walk up steps with assistance ___ Intermittent supervision/assistance  ___ Bathe/dress independently ___ Walk with walker     _X__ Bathe/dress with assistance ___ Walk Independently    ___ Shower independently ___ Walk with assistance    ___ Shower with assistance _X__ No alcohol     ___ Return to work/school ________   Medical Equipment/Items Ordered: Wheelchair, Drop Arm BSC, Producer, television/film/video, Slide Board                                                 Agency/Supplier: [place]:   PT     OT                     Agency: TBD Upon Insurance Approval Phone:   Home Health:   RN (IV ABX and Independent With Wound Care)                    Agency: [place] Phone: [phone]    Special Instructions: Keep appointment with Dr. Zadie Cleverly 25th at 1:45 pm/Infectious disease who will help determine length of antibiotic regimen and monitor infection markers.  2. Absolutely no weight on right foot. Keep legs elevated whenever seated. LOW SALT DIET WILL HELP WITH SWELLING PROBLEMS.   My questions have been answered and I understand these instructions. I will adhere to these goals and the provided educational materials after my discharge from the hospital.  Patient/Caregiver Signature _______________________________ Date __________  Clinician Signature  _______________________________________ Date __________  Please bring this form and your medication list with you to all your follow-up doctor's appointments.

## 2021-04-25 NOTE — Progress Notes (Signed)
PHARMACY CONSULT NOTE FOR:  OUTPATIENT  PARENTERAL ANTIBIOTIC THERAPY (OPAT)  Indication: osteomyelitis  Regimen: ceftriaxone 2g IV q24h End date: 05/24/2021  IV antibiotic discharge orders are pended. To discharging provider:  please sign these orders via discharge navigator,  Select New Orders & click on the button choice - Manage This Unsigned Work.     Thank you for allowing pharmacy to be a part of this patient's care.  Phillis Haggis 04/25/2021, 12:34 PM

## 2021-04-25 NOTE — Progress Notes (Addendum)
Occupational Therapy Discharge Summary  Patient Details  Name: Sherri Dixon MRN: [id number] Date of Birth: 08/07/1981  Today's Date: 04/25/2021 OT Individual Time: 1334-1403 OT Individual Time Calculation (min): 29 min   Short Term Goals: Week 1:  OT Short Term Goal 1 (Week 1): patient will complete lower body bathing and dressing at bed level with mod/max A OT Short Term Goal 2 (Week 1): patient will complete SB transfer to/from level surfaces with mod A of 2 OT Short Term Goal 3 (Week 1): patient will complete toileting on drop arm commode with mod a of 2  Patient has met 6 of 8 long term goals due to improved activity tolerance, improved balance, postural control, ability to compensate for deficits, and improved coordination.  Patient to discharge at overall Min Assist level.  Patient's care partner is independent to provide the necessary physical assistance at discharge.    Reasons goals not met: Due to LOS  decreased endurance and decreased mobility  Recommendation:  Patient will benefit from ongoing skilled OT services in home health setting to continue to advance functional skills in the area of BADL, iADL, and Reduce care partner burden.  Equipment: Bariatric drop arm BSC  Reasons for discharge: treatment goals met  Patient/family agrees with progress made and goals achieved: Yes  Skilled Therapeutic Interventions/Progress Updates:  Met pt lying in bed with HOB elevated, bed alarm on, husband present, pt agreed to session. Treatment session was focused on family education to prepare for d/c. Pt reported no pain and felt better from morning session, Ensure helped but made blood sugar increase, made aware to monitor levels to promote positive healing. OTS inquired if pt or husband had any concerns for d/c, both concerned about L foot management and healing. OT and OTS encouraged pt to communicate with nursing staff on wound management. OT and OTS educated pt and husband on  importance of  mirror inspection for foot healing. Pt was left sitting in bed, bed alarm on, all needs met.   OT Discharge Precautions/Restrictions  Precautions Precautions: Fall Restrictions RLE Weight Bearing: Non weight bearing General PT Missed Treatment Reason: Patient fatigue;Other (Comment) (patient feeling woozy and light headed) Vital Signs Therapy Vitals Temp: 97.8 F (36.6 C) Temp Source: Oral Pulse Rate: 71 Resp: 17 BP: (!) 168/87 Patient Position (if appropriate): Lying Oxygen Therapy SpO2: 99 % O2 Device: Room Air Pain Pain Assessment Pain Scale: 0-10 Pain Score: 0-No pain Faces Pain Scale: No hurt ADL ADL Eating: Independent Where Assessed-Eating: Wheelchair Grooming: Setup Where Assessed-Grooming: Edge of bed, Bed level Upper Body Bathing: Supervision/safety Where Assessed-Upper Body Bathing: Edge of bed Lower Body Bathing: Minimal assistance Where Assessed-Lower Body Bathing: Edge of bed Upper Body Dressing: Setup Where Assessed-Upper Body Dressing: Edge of bed Lower Body Dressing: Maximal assistance Where Assessed-Lower Body Dressing: Bed level Toileting: Moderate assistance Where Assessed-Toileting: Bedside Commode Toilet Transfer: Minimal assistance Toilet Transfer Method: Transfer board Tub/Shower Transfer: Unable to assess Tub/Shower Transfer Method: Unable to assess Walk-In Shower Transfer: Unable to assess Walk-In Shower Transfer Method: Unable to assess Vision Baseline Vision/History: Wears glasses Patient Visual Report: No change from baseline Vision Assessment?: No apparent visual deficits Perception  Perception: Within Functional Limits Praxis Praxis: Intact Cognition Overall Cognitive Status: Within Functional Limits for tasks assessed Arousal/Alertness: Awake/alert Orientation Level: Oriented X4 Memory: Appears intact Awareness: Appears intact Problem Solving: Appears intact Safety/Judgment: Appears  intact Sensation Sensation Light Touch: Appears Intact Hot/Cold: Appears Intact Proprioception: Appears Intact Stereognosis: Appears Intact Coordination Gross Motor Movements are   Fluid and Coordinated: No Fine Motor Movements are Fluid and Coordinated: Yes Coordination and Movement Description: grossly uncoordinated due to generalized weakness/deconditioning, pain, fatigue, and body habitus Finger Nose Finger Test: WFL bilaterally Motor  Motor Motor: Abnormal postural alignment and control Motor - Skilled Clinical Observations: grossly uncoordinated due to mild L hemi, generalized weakness/deconditioning, pain, fatigue, and body habitus Mobility  Bed Mobility Bed Mobility: Rolling Right;Rolling Left;Supine to Sit;Sit to Supine;Scooting to HOB;Sitting - Scoot to Edge of Bed Rolling Right: Supervision/verbal cueing Rolling Left: Supervision/Verbal cueing Supine to Sit: Supervision/Verbal cueing Sitting - Scoot to Edge of Bed: Minimal Assistance - Patient > 75% Sit to Supine: Supervision/Verbal cueing Scooting to HOB: Contact Guard/Touching assist  Trunk/Postural Assessment  Cervical Assessment Cervical Assessment: Exceptions to WFL Thoracic Assessment Thoracic Assessment: Exceptions to WFL Lumbar Assessment Lumbar Assessment: Exceptions to WFL Postural Control Postural Control: Deficits on evaluation  Balance Balance Balance Assessed: Yes Static Sitting Balance Static Sitting - Balance Support: Feet supported;Bilateral upper extremity supported Static Sitting - Level of Assistance: 5: Stand by assistance Dynamic Sitting Balance Dynamic Sitting - Balance Support: Bilateral upper extremity supported;Feet supported Dynamic Sitting - Level of Assistance: 5: Stand by assistance Extremity/Trunk Assessment RUE Assessment RUE Assessment: Within Functional Limits LUE Assessment LUE Assessment: Within Functional Limits   TyQwisha Gwynn 04/25/2021, 2:40 PM 

## 2021-04-26 ENCOUNTER — Other Ambulatory Visit (HOSPITAL_COMMUNITY): Payer: Self-pay

## 2021-04-26 ENCOUNTER — Telehealth: Payer: Self-pay | Admitting: *Deleted

## 2021-04-26 LAB — GLUCOSE, CAPILLARY: Glucose-Capillary: 91 mg/dL (ref 70–99)

## 2021-04-26 MED ORDER — HEPARIN SOD (PORK) LOCK FLUSH 100 UNIT/ML IV SOLN
250.0000 [IU] | INTRAVENOUS | Status: AC | PRN
  Administered 2021-04-26: 250 [IU]
  Filled 2021-04-26: qty 2.5

## 2021-04-26 NOTE — Progress Notes (Signed)
INPATIENT REHABILITATION DISCHARGE NOTE   Discharge instructions by: Algis Liming, PA  Verbalized understanding: yes  Skin care/Wound care: done, spouse demonstrated dressing change  Pain: none  IV's: PICC capped for home by IV team  Tubes/Drains: none  Safety instructions: done  Patient belongings: sent with pt's spouse  Discharged to: home  Discharged via: PTAR  Notes: done  Gerald Stabs, RN

## 2021-04-26 NOTE — Progress Notes (Signed)
Patient ID: Sherri Dixon, female   DOB: [birth year], 40 y.o.   MRN: [id number]  SW informed last night patient will require ambulance transfer and slide board. DME ordered through [employer]. PT[facility] scheduled for [place], [place]

## 2021-04-26 NOTE — Discharge Summary (Signed)
Physician Discharge Summary  Patient ID: Sherri Dixon MRN: [id number] DOB/AGE: January 25, 1981 40 y.o.  Admit date: 04/17/2021 Discharge date: 04/26/2021  Discharge Diagnoses:  Principal Problem:   Partial nontraumatic amputation of right foot [facility]) Active Problems:   Morbid obesity ([place])   Anemia   Diabetes mellitus type 2 in obese ([facility])   AKI (acute kidney injury) ([place])   Vitamin D deficiency   Discharged Condition: good  Significant Diagnostic Studies: N/A   Labs:  Basic Metabolic Panel: Recent Labs  Lab 04/22/21 0346 04/23/21 0411 04/24/21 0500  NA 139 139 139  K 3.8 4.1 4.4  CL 101 103 102  CO2 33* 33* 32  GLUCOSE 90 95 125*  BUN 5* 5* 5*  CREATININE 1.21* 1.24* 1.21*  CALCIUM 7.8* 7.8* 7.8*    CBC: Recent Labs  Lab 04/22/21 0346 04/23/21 0411  WBC 8.5  --   HGB 7.7* 8.6*  HCT 25.4* 27.7*  MCV 92.4  --   PLT 465*  --     CBG: Recent Labs  Lab 04/25/21 0612 04/25/21 1141 04/25/21 1615 04/25/21 2133 04/26/21 0608  GLUCAP 106* 141* 172* 111* 91    Brief HPI:   Sherri Dixon is a 40 y.o. female with history of HTN, T2DM, PE, super morbid obesity, microcytic anemia who was admitted on 04/05/2021 with SIRS due to purulent drainage due to necrotizing changes of right foot wound.  She was started on broad-spectrum antibiotics and underwent I&D of multiple bursal cavity with wound VAC application by Dr. Lucia Gaskins.  Amputation was recommended and Dr. Graylon Good who was consulted with input agreed with recommendations due to significant burden of infection.  She underwent right foot transmetatarsal amputation on 07/15 and to be nonweightbearing.  Wound cultures were positive for Proteus mirabilis and Streptococcus Gallolyticus.   Antibiotics narrowed to IV ceftriaxone 2 g and Flagyl for 6 weeks total with end date 08/26.  She has been transfused with 2 units packed red blood cells for drop in H&H and leukocytosis noted to be trending downwards.  Therapy was ongoing and  patient was noted to be limited by weightbearing status, body habitus and weakness due to debility.  CIR was recommended due to functional decline.   Hospital Course: Sherri Dixon was admitted to rehab 04/17/2021 for inpatient therapies to consist of PT and OT at least three hours five days a week. Past admission physiatrist, therapy team and rehab RN have worked together to provide customized collaborative inpatient rehab.  Chronic diastolic CHF has been compensated.  Furosemide was reduced to 40 mg twice daily and weights have been reasonably stable at 121.6 kg's.  Hypokalemia has resolved with addition of Kdur for supplementation.  She was found to have low vitamin D level and was started on 50,000 units weekly.  Hypomagnesemia has resolved with IV supplementation.Blood pressures were monitored on TID basis and have been stable.  Pain has been controlled with as needed use of oxycodone.  Her diabetes was monitored with ac/hs CBG checks and SSI was use prn for tighter BS control.  She reported poor p.o. intake and husband was encouraged to bring meals from home to help with appetite.  Protein supplements were added to help promote wound healing.  Her blood sugars reasonably controlled on sliding scale insulin.  Wound VAC was removed without difficulty.  Wound continues to have mild to moderate amount of drainage from incision.  Sutures remain intact but edges noted to be gaping.  She was advised on maintaining strict nonweightbearing as  well as dry dressing and has been set for follow-up for wound evaluation for 08/01.  She was maintained on Rocephin and flagyl during her stay.  Follow-up CBC shows white count to be stable and acute blood loss anemia is resolving.  Check of electrolytes shows mild AKI which is stable.  She has been making steady gains and mobility continues to be limited by weightbearing restrictions.  She currently requires min assist at wheelchair level and will continue to receive  follow-up home health PT, OT and RN by [facility].  [facility] provided by [facility].   Rehab course: During patient's stay in rehab weekly team conferences were held to monitor patient's progress, set goals and discuss barriers to discharge. At admission, patient required total assist with basic ADL tasks and with mobility.  She has had improvement in activity tolerance, balance, postural control as well as ability to compensate for deficits.  She is able to complete ADL tasks with min assist. She requires min assist for sliding board transfers. She is able to propel her wheelchair for 50'.  Family education was completed with husband regarding all aspects of safety and care.     Discharge disposition: 01-Home or Self Care  Diet: Heart healthy/carb modified.  Special Instructions: Wash incision with antibacterial soap and water, pat dry and apply dry dressing.  Change dressing daily. Continue nonweightbearing on right lower extremity.   3.Monitor blood sugars AC at bedtime and use sliding scale per protocol Discharge Instructions     [facility] pharmacist to adjust dose for Vancomycin, Aminoglycosides and other anti-infective therapies as requested by physician.   Complete by: As directed    [facility] to provide Cath Flo 22m   Complete by: As directed    Administer for PICC line occlusion and as ordered by physician for other access device issues.   Anaphylaxis Kit: Provided to treat any anaphylactic reaction to the medication being provided to the patient if First Dose or when requested by physician   Complete by: As directed    Epinephrine 140mml vial / amp: Administer 0.67m567m0.67ml29mubcutaneously once for moderate to severe anaphylaxis, nurse to call physician and pharmacy when reaction occurs and call 911 if needed for immediate care   Diphenhydramine 50mg56mIV vial: Administer 25-50mg 20mM PRN for first dose reaction, rash,  itching, mild reaction, nurse to call physician and pharmacy when reaction occurs   Sodium Chloride 0.9% NS 500ml I24mdminister if needed for hypovolemic blood pressure drop or as ordered by physician after call to physician with anaphylactic reaction   Change dressing on IV access line weekly and PRN   Complete by: As directed    Flush IV access with Sodium Chloride 0.9% and Heparin 10 units/ml or 100 units/ml   Complete by: As directed    Home infusion instructions - [facility]   Complete by: As directed    Instructions: Flush IV access with Sodium Chloride 0.9% and Heparin 10units/ml or 100units/ml   Change dressing on IV access line: Weekly and PRN   Instructions Cath Flo 2mg: Ad99mister for PICC Line occlusion and as ordered by physician for other access device   [facility] pharmacist to adjust dose for: Vancomycin, Aminoglycosides and other anti-infective therapies as requested by physician   Method of administration may be changed at the discretion of home infusion pharmacist based upon assessment of the patient and/or caregiver's ability to self-administer the medication ordered  Complete by: As directed    Outpatient Parenteral Antibiotic Therapy Information Antibiotic: Ceftriaxone (Rocephin) IVPB; Indications for use: osteomyelitis; End Date: 05/24/2021   Complete by: As directed    Antibiotic: Ceftriaxone (Rocephin) IVPB   Indications for use: osteomyelitis   End Date: 05/24/2021      Allergies as of 04/26/2021       Reactions   Amlodipine Swelling   Humalog Kwikpen [insulin Lispro] Other (See Comments)   75/50---swelling and ulcers in mouth         Medication List     STOP taking these medications    Farxiga 10 MG Tabs tablet Generic drug: dapagliflozin propanediol   ferrous sulfate 325 (65 FE) MG EC tablet       TAKE these medications    acetaminophen 325 MG tablet Commonly known as: TYLENOL Take 1-2 tablets (325-650 mg total) by  mouth every 4 (four) hours as needed for mild pain.   Alcohol Swabs 70 % Pads 1 application by Does not apply route 3 (three) times daily before meals.   cefTRIAXone  IVPB Commonly known as: ROCEPHIN Inject 2 g into the vein daily. Indication:  osteomyelitis  First Dose: No Last Day of Therapy:  05/24/2021 Labs - Once weekly:  CBC/D and BMP, Labs - Every other week:  ESR and CRP Method of administration: IV Push Method of administration may be changed at the discretion of home infusion pharmacist based upon assessment of the patient and/or caregiver's ability to self-administer the medication ordered.   cetirizine 10 MG tablet Commonly known as: ZYRTEC Take 10 mg by mouth daily as needed for allergies.   docusate sodium 100 MG capsule Commonly known as: COLACE Take 2 capsules (200 mg total) by mouth daily.   furosemide 20 MG tablet Commonly known as: LASIX Take 1 tablet (20 mg total) by mouth 2 (two) times daily. What changed:  medication strength how much to take   gabapentin 300 MG capsule Commonly known as: NEURONTIN Take 300 mg by mouth daily as needed (pain).   glucose blood test strip Use as instructed   hydrALAZINE 50 MG tablet Commonly known as: APRESOLINE Take 1 tablet (50 mg total) by mouth 3 (three) times daily.   INSULIN SYRINGE 1CC/28G 28G X 1/2" 1 ML Misc 1 application by Does not apply route 3 (three) times daily before meals.   magnesium oxide 400 MG tablet Commonly known as: MAG-OX Take 1 tablet (400 mg total) by mouth daily.   metoprolol succinate 100 MG 24 hr tablet Commonly known as: TOPROL-XL Take 1 tablet (100 mg total) by mouth daily.   metroNIDAZOLE 500 MG tablet Commonly known as: FLAGYL Take 1 tablet (500 mg total) by mouth every 8 (eight) hours.   multivitamin with minerals Tabs tablet Take 1 tablet by mouth daily. Centrum   NovoLOG FlexPen 100 UNIT/ML FlexPen Generic drug: insulin aspart Inject 12-20 Units as directed 3 (three)  times daily. Sliding scale   nutrition supplement (JUVEN) Pack Take 1 packet by mouth 2 (two) times daily between meals.   olmesartan 40 MG tablet Commonly known as: BENICAR Take 1 tablet (40 mg total) by mouth daily.   ondansetron 4 MG tablet Commonly known as: Zofran Take 1 tablet (4 mg total) by mouth daily as needed for nausea or vomiting.   oxyCODONE 5 MG immediate release tablet Commonly known as: Oxy IR/ROXICODONE Take 1-2 tablets (5-10 mg total) by mouth every 8 (eight) hours as needed for moderate pain or severe pain. What  changed: when to take this   pantoprazole 40 MG tablet Commonly known as: PROTONIX Take 1 tablet (40 mg total) by mouth daily at 6 PM.   potassium chloride 10 MEQ tablet Commonly known as: KLOR-CON Take 3 tablets (30 mEq total) by mouth 2 (two) times daily. What changed:  medication strength how much to take when to take this   rosuvastatin 40 MG tablet Commonly known as: CRESTOR Take 1 tablet (40 mg total) by mouth daily.   Vitamin D (Ergocalciferol) 1.25 MG (50000 UNIT) Caps capsule Commonly known as: DRISDOL Take 50,000 Units by mouth once a week.   Xarelto 20 MG Tabs tablet Generic drug: rivaroxaban Take 20 mg by mouth daily.   Zinc Sulfate 220 (50 Zn) MG Tabs Take 1 tablet (220 mg total) by mouth daily.               Discharge Care Instructions  (From admission, onward)           Start     Ordered   04/25/21 0000  Change dressing on IV access line weekly and PRN  (Home infusion instructions - [facility] )        04/25/21 1650            Follow-up Information     Raulkar, Clide Deutscher, MD Follow up.   Specialty: Physical Medicine and Rehabilitation Why: 06/27/21 please arrive at 8:50 for 9:20am appointment, thank you! Contact information: [address] [place] [address] [phone]         Erle Crocker, MD. Call on 04/29/2021.   Specialty: Orthopedic Surgery Why: Be there  at 12:30 pm for 12:45 appointment. Contact information: [place] [address] [phone]         Tomasa Hose, NP. Call on 04/29/2021.   Specialty: Nurse Practitioner Why: for post hospital follow up Contact information: [place] [id number] [phone]                 Signed: Bary Leriche 04/28/2021, 10:35 PM

## 2021-04-26 NOTE — Progress Notes (Signed)
Inpatient Rehabilitation Care Coordinator Discharge Note  The overall goal for the admission was met for:   Discharge location: Yes, home  Length of Stay: Yes, 9 Days  Discharge activity level: Yes  Home/community participation: Yes  Services provided included: MD, RD, PT, OT, SLP, RN, CM, TR, Pharmacy, Neuropsych, and SW  Financial Services: Private Insurance: [employer] N[employer]  Choices offered to/list presented [id number]  Follow-up services arranged: Home Health: [facility] (IV ABX and Patient Independent with Wound Care) PT OT: TBD Upon Approval  : HEP PROVIDED   Comments (or additional information): Wheelchair, Bedside Commode, Producer, television/film/video   Patient/Family verbalized understanding of follow-up arrangements: Yes  Individual responsible for coordination of the follow-up plan: Patient, [phone]  Confirmed correct DME delivered: [name] 04/26/2021    [name]

## 2021-04-26 NOTE — Telephone Encounter (Signed)
Mrs Stoy called because they need for Dr Ranell Patrick to fill out FMLA papers for her husband's job.  I have advised him that he will need to fax them to our office  or drop them by the front desk. I have given him the fax number here.

## 2021-04-26 NOTE — Progress Notes (Signed)
Patient ID: Sherri Dixon, female   DOB: April 07, 1981, 40 y.o.   MRN: [id number]  D/c packet left at [place], [place]

## 2021-04-28 DIAGNOSIS — E559 Vitamin D deficiency, unspecified: Secondary | ICD-10-CM

## 2021-05-23 ENCOUNTER — Ambulatory Visit (INDEPENDENT_AMBULATORY_CARE_PROVIDER_SITE_OTHER): Payer: 59 | Admitting: Infectious Diseases

## 2021-05-23 ENCOUNTER — Telehealth: Payer: Self-pay

## 2021-05-23 ENCOUNTER — Other Ambulatory Visit: Payer: Self-pay

## 2021-05-23 VITALS — BP 160/95 | HR 71 | Temp 97.5°F | Resp 16 | Ht 62.0 in | Wt 248.0 lb

## 2021-05-23 DIAGNOSIS — Z5181 Encounter for therapeutic drug level monitoring: Secondary | ICD-10-CM | POA: Diagnosis not present

## 2021-05-23 DIAGNOSIS — E11628 Type 2 diabetes mellitus with other skin complications: Secondary | ICD-10-CM

## 2021-05-23 DIAGNOSIS — L089 Local infection of the skin and subcutaneous tissue, unspecified: Secondary | ICD-10-CM | POA: Diagnosis not present

## 2021-05-23 DIAGNOSIS — M86171 Other acute osteomyelitis, right ankle and foot: Secondary | ICD-10-CM | POA: Diagnosis not present

## 2021-05-23 DIAGNOSIS — M869 Osteomyelitis, unspecified: Secondary | ICD-10-CM | POA: Insufficient documentation

## 2021-05-23 NOTE — Progress Notes (Signed)
[facility]                                                             [place], [place], [place], [address]                                                                  Phn. 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 OR cultures growing Proteus mirabilis and streptococcus gallolyticus.  S/p TMA on 7/15. Surgical pathology: Acute suppurative inflammation with abscess.  Blood cx 7/8 NG 2/2 sets  ABI Lower extremities WNL    # Medication Monitoring  05/21/21 WBC 7.2, hb 9.3, plts 288, Cr 0.78, ESR 60 and CRP <1.    CKD IDDM Obesity  Plan Continue IV ceftriaxone as is until 05/24/21 Will coordinate with [facility] for PICC line removal after end date of abtx Follow up with Dr Lucia Gaskins as instructed   All questions and concerns were discussed and addressed. Patient verbalized understanding of the plan. 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 Patient is accompanied by husband and grandson. Says she is getting IV abtx through RT arm PICC line. No issues with PICC line but started having nausea and vomiting for last 2 weeks due to which she had to stop metronidazole. Nausea and vomiting has somewhat improved with stopping the  metronidazole but has not fully resolved. Denies any abdominal pain and diarrhea. She is also taking zofran for the nausea. She says she has followed up with Dr Lucia Gaskins after hospital discharge and was told that wound is healing well. She has another appt in Aug 31 when sutures are planned to be removed. Discussed with her about BG control, plan to continue IV abtx until tomorrow and remove PICC line after that. She agrees to the plan.   ROS: Constitutional: Negative for fever, chills, activity change, appetite change, fatigue and unexpected weight change.  Respiratory: Negative for cough, shortness of breath Cardiovascular: Negative for chest pain, palpitations and leg swelling.  Gastrointestinal: Negative for abdominal pain, diarrhea/constipation, .  Genitourinary: Negative for dysuria, hematuria, flank pain Musculoskeletal: Negative  for myalgias, arthralgia, back pain, joint swelling, arthralgias Skin: Negative for rashes, lesions  Neurological: Negative for weakness, dizziness or headache  Past Medical History:  Diagnosis Date   Anemia    Asthma    CHF (congestive heart failure) (HCC)    Pt reports that father has CHF, not her   Diabetes mellitus without complication ([place])    Dyslipidemia    GERD (gastroesophageal reflux disease)    Pulmonary embolism ([place])    Stroke [facility])    Past Surgical History:  Procedure Laterality Date   APPLICATION OF WOUND VAC Right 04/12/2021   Procedure: APPLICATION OF WOUND VAC;  Surgeon: Erle Crocker, MD;  Location: [facility];  Service: Orthopedics;  Laterality: Right;   I & D EXTREMITY Right 04/06/2021   Procedure: IRRIGATION AND DEBRIDEMENT TOE WITH WOUND VAC PLACEMENT;  Surgeon: Erle Crocker, MD;  Location: [facility];  Service: Orthopedics;  Laterality: Right;   I & D EXTREMITY Right 04/12/2021   Procedure: IRRIGATION AND DEBRIDEMENT RIGHT FOOT;  Surgeon: Erle Crocker, MD;  Location: [facility];  Service: Orthopedics;  Laterality: Right;    MOUTH SURGERY     ROBOTIC ASSITED PARTIAL NEPHRECTOMY Right 07/28/2019   Procedure: XI ROBOTIC ASSITED PARTIAL NEPHRECTOMY;  Surgeon: Cleon Gustin, MD;  Location: [facility];  Service: Urology;  Laterality: Right;  3 HRS   TRANSMETATARSAL AMPUTATION Right 04/12/2021   Procedure: TRANSMETATARSAL AMPUTATION;  Surgeon: Erle Crocker, MD;  Location: [facility];  Service: Orthopedics;  Laterality: Right;   Current Outpatient Medications on File Prior to Visit  Medication Sig Dispense Refill   acetaminophen (TYLENOL) 325 MG tablet Take 1-2 tablets (325-650 mg total) by mouth every 4 (four) hours as needed for mild pain.     Alcohol Swabs 70 % PADS 1 application by Does not apply route 3 (three) times daily before meals. 100 each 3   cefTRIAXone (ROCEPHIN) IVPB Inject 2 g into the vein daily. Indication:  osteomyelitis  First Dose: No Last Day of Therapy:  05/24/2021 Labs - Once weekly:  CBC/D and BMP, Labs - Every other week:  ESR and CRP Method of administration: IV Push Method of administration may be changed at the discretion of home infusion pharmacist based upon assessment of the patient and/or caregiver's ability to self-administer the medication ordered. 28 Units 0   cetirizine (ZYRTEC) 10 MG tablet Take 10 mg by mouth daily as needed for allergies.     docusate sodium (COLACE) 100 MG capsule Take 2 capsules (200 mg total) by mouth daily. 60 capsule 0   furosemide (LASIX) 20 MG tablet Take 1 tablet (20 mg total) by mouth 2 (two) times daily. 60 tablet 0   gabapentin (NEURONTIN) 300 MG capsule Take 300 mg by mouth daily as needed (pain).     glucose blood test strip Use as instructed 100 each 3   hydrALAZINE (APRESOLINE) 50 MG tablet Take 1 tablet (50 mg total) by mouth 3 (three) times daily. 90 tablet 1   insulin aspart (NOVOLOG FLEXPEN) 100 UNIT/ML FlexPen Inject 12-20 Units as directed 3 (three) times daily. Sliding scale     Insulin Syringe-Needle U-100 (INSULIN SYRINGE 1CC/28G) 28G X  1/2" 1 ML MISC 1 application by Does not apply route 3 (three) times daily before meals. 100 each 0   magnesium oxide (MAG-OX) 400 MG tablet Take 1 tablet (400 mg total) by mouth daily. 30 tablet 0   metoprolol succinate (TOPROL-XL) 100 MG 24 hr tablet Take 1 tablet (  100 mg total) by mouth daily. 30 tablet 0   metroNIDAZOLE (FLAGYL) 500 MG tablet Take 1 tablet (500 mg total) by mouth every 8 (eight) hours. 86 tablet 0   Multiple Vitamin (MULTIVITAMIN WITH MINERALS) TABS tablet Take 1 tablet by mouth daily. Centrum     nutrition supplement, JUVEN, (JUVEN) PACK Take 1 packet by mouth 2 (two) times daily between meals. 60 packet 0   olmesartan (BENICAR) 40 MG tablet Take 1 tablet (40 mg total) by mouth daily.  2   ondansetron (ZOFRAN) 4 MG tablet Take 1 tablet (4 mg total) by mouth daily as needed for nausea or vomiting. 30 tablet 1   oxyCODONE (OXY IR/ROXICODONE) 5 MG immediate release tablet Take 1-2 tablets (5-10 mg total) by mouth every 8 (eight) hours as needed for moderate pain or severe pain. 40 tablet 0   pantoprazole (PROTONIX) 40 MG tablet Take 1 tablet (40 mg total) by mouth daily at 6 PM. 30 tablet 0   potassium chloride (KLOR-CON) 10 MEQ tablet Take 3 tablets (30 mEq total) by mouth 2 (two) times daily. 180 tablet 0   rosuvastatin (CRESTOR) 40 MG tablet Take 1 tablet (40 mg total) by mouth daily. 30 tablet 0   Vitamin D, Ergocalciferol, (DRISDOL) 1.25 MG (50000 UNIT) CAPS capsule Take 50,000 Units by mouth once a week.     XARELTO 20 MG TABS tablet Take 20 mg by mouth daily.     Zinc Sulfate 220 (50 Zn) MG TABS Take 1 tablet (220 mg total) by mouth daily. 30 tablet 0   No current facility-administered medications on file prior to visit.   Allergies  Allergen Reactions   Amlodipine Swelling   Humalog Kwikpen [Insulin Lispro] Other (See Comments)    75/50---swelling and ulcers in mouth    Social History   Socioeconomic History   Marital status: Married    Spouse name: Not on file    Number of children: Not on file   Years of education: Not on file   Highest education level: Not on file  Occupational History   Not on file  Tobacco Use   Smoking status: Never   Smokeless tobacco: Never  Vaping Use   Vaping Use: Never used  Substance and Sexual Activity   Alcohol use: Yes    Alcohol/week: 1.0 standard drink    Types: 1 Glasses of wine per week    Comment: occas   Drug use: No   Sexual activity: Yes    Birth control/protection: None  Other Topics Concern   Not on file  Social History Narrative   Not on file   Social Determinants of Health   Financial Resource Strain: Not on file  Food Insecurity: Not on file  Transportation Needs: Not on file  Physical Activity: Not on file  Stress: Not on file  Social Connections: Not on file  Intimate Partner Violence: Not on file   Family History  Problem Relation Age of Onset   High blood pressure Mother    Diabetes Mother    Arthritis Mother    Hyperlipidemia Mother    High blood pressure Father    Hyperlipidemia Father    High blood pressure Sister    Diabetes Brother    Hyperlipidemia Brother    High blood pressure Brother     Vitals    Examination  General - not in acute distress, comfortably sitting in chair HEENT - PEERLA, no pallor and no icterus Chest - b/l clear air entry,  no additional sounds CVS- Normal s1s2, RRR Abdomen - Soft, Non tender , non distended Ext- no pedal edema Neuro: grossly normal Back - WNL Psych : calm and cooperative   Recent labs CBC Latest Ref Rng & Units 04/23/2021 04/22/2021 04/18/2021  WBC 4.0 - 10.5 K/uL - 8.5 12.9(H)  Hemoglobin 12.0 - 15.0 g/dL 8.6(L) 7.7(L) 8.2(L)  Hematocrit 36.0 - 46.0 % 27.7(L) 25.4(L) 25.4(L)  Platelets 150 - 400 K/uL - 465(H) 518(H)   CMP Latest Ref Rng & Units 04/24/2021 04/23/2021 04/22/2021  Glucose 70 - 99 mg/dL 125(H) 95 90  BUN 6 - 20 mg/dL 5(L) 5(L) 5(L)  Creatinine 0.44 - 1.00 mg/dL 1.21(H) 1.24(H) 1.21(H)  Sodium 135 - 145  mmol/L 139 139 139  Potassium 3.5 - 5.1 mmol/L 4.4 4.1 3.8  Chloride 98 - 111 mmol/L 102 103 101  CO2 22 - 32 mmol/L 32 33(H) 33(H)  Calcium 8.9 - 10.3 mg/dL 7.8(L) 7.8(L) 7.8(L)  Total Protein 6.5 - 8.1 g/dL - - 5.5(L)  Total Bilirubin 0.3 - 1.2 mg/dL - - 0.6  Alkaline Phos 38 - 126 U/L - - 141(H)  AST 15 - 41 U/L - - 21  ALT 0 - 44 U/L - - 20     Pertinent Microbiology Results for orders placed or performed during the hospital encounter of 04/17/21  SARS CORONAVIRUS 2 (TAT 6-24 HRS) Nasopharyngeal Nasopharyngeal Swab     Status: None   Collection Time: 04/23/21  9:35 AM   Specimen: Nasopharyngeal Swab  Result Value Ref Range Status   SARS Coronavirus 2 NEGATIVE NEGATIVE Final    Comment: (NOTE) SARS-CoV-2 target nucleic acids are NOT DETECTED.  The SARS-CoV-2 RNA is generally detectable in upper and lower respiratory specimens during the acute phase of infection. Negative results do not preclude SARS-CoV-2 infection, do not rule out co-infections with other pathogens, and should not be used as the sole basis for treatment or other patient management decisions. Negative results must be combined with clinical observations, patient history, and epidemiological information. The expected result is Negative.  Fact Sheet for Patients: SugarRoll.be  Fact Sheet for Healthcare Providers: https://www.woods-mathews.com/  This test is not yet approved or cleared by the [place] FDA and  has been authorized for detection and/or diagnosis of SARS-CoV-2 by FDA under an Emergency Use Authorization (EUA). This EUA will remain  in effect (meaning this test can be used) for the duration of the COVID-19 declaration under Se ction 564(b)(1) of the Act, 21 U.S.C. section 360bbb-3(b)(1), unless the authorization is terminated or revoked sooner.  Performed at [facility], [place] [address], [place], [place] [address]      Pertinent  Imagings All pertinent labs/Imagings/notes reviewed. All pertinent plain films and CT images have been personally visualized and interpreted; radiology reports have been reviewed. Decision making incorporated into the Impression / Recommendations.  I have spent a total of 30 minutes of face-to-face and non-face-to-face time, excluding clinical staff time, preparing to see patient, ordering tests and/or medications, and provide counseling the patient    Electronically signed by:  Rosiland Oz, MD Infectious Disease Physician [facility] [address] [place], [place] [address] Phone: [phone]  Fax: [phone]

## 2021-05-23 NOTE — Telephone Encounter (Signed)
Advised Sherri Dixon at [facility] that PULL PICC can be done after last dose on 05/24/21.

## 2021-06-14 ENCOUNTER — Telehealth: Payer: Self-pay

## 2021-06-14 NOTE — Telephone Encounter (Signed)
Pt states she is getting food stamps but the food stamp office is needing a statement from the doctor about patients condition. Patient is requesting the doctor calls her.

## 2021-06-17 ENCOUNTER — Other Ambulatory Visit: Payer: Self-pay | Admitting: Orthopaedic Surgery

## 2021-06-17 ENCOUNTER — Encounter (HOSPITAL_COMMUNITY): Payer: Self-pay | Admitting: Orthopaedic Surgery

## 2021-06-17 ENCOUNTER — Other Ambulatory Visit: Payer: Self-pay

## 2021-06-17 NOTE — Progress Notes (Signed)
PCP - Fonnie Jarvis NP Cardiologist - Corky Crafts, MD EKG - 04/08/21 Chest x-ray -  ECHO - 01/26/21 Cardiac Cath -  CPAP -   Fasting Blood Sugar:  pt tries to maintain <200 Checks Blood Sugar:  3-4x/day  Blood Thinner Instructions: per pt, she's been out of Xarelto for 5 days - last dose per pt 06/12/21 Aspirin Instructions:   ERAS Protcol -   COVID TEST- DOS  Anesthesia review: yes  -------------  SDW INSTRUCTIONS:  Your procedure is scheduled on Tuesday 9/20. Please report to [facility] Entrance "A" at 0835 A.M., and check in at the Admitting office. Call this number if you have problems the morning of surgery: [phone]   Remember: Do not eat or drink after midnight the night before your surgery  Medications to take morning of surgery with a sip of water include: gabapentin (NEURONTIN) hydrALAZINE (APRESOLINE) metoprolol succinate (TOPROL-XL)   If needed: acetaminophen (TYLENOL) cetirizine (ZYRTEC) ondansetron (ZOFRAN) oxyCODONE (OXY IR/ROXICODONE)    Follow your surgeon's instructions on when to stop XARELTO.  If no instructions were given by your surgeon then you will need to call the office to get those instructions.    As of today, STOP taking any Aspirin (unless otherwise instructed by your surgeon), Aleve, Naproxen, Ibuprofen, Motrin, Advil, Goody's, BC's, all herbal medications, fish oil, and all vitamins.   ** PLEASE check your blood sugar the morning of your surgery when you wake up and every 2 hours until you get to the Short Stay unit.  If your blood sugar is less than 70 mg/dL, you will need to treat for low blood sugar: Do not take insulin. Treat a low blood sugar (less than 70 mg/dL) with  cup of clear juice (cranberry or apple), 4 glucose tablets, OR glucose gel. Recheck blood sugar in 15 minutes after treatment (to make sure it is greater than 70 mg/dL). If your blood sugar is not greater than 70 mg/dL on recheck, call [phone] for  further instructions.  insulin aspart (NOVOLOG FLEXPEN) 9/19: no bedtime dose 9/20: NONE - CBG >220 take half normal dose     The Morning of Surgery Do not wear jewelry, make-up or nail polish. Do not wear lotions, powders, or perfumes, or deodorant Do not bring valuables to the hospital. [facility] is not responsible for any belongings or valuables.  If you are a smoker, DO NOT Smoke 24 hours prior to surgery  If you wear a CPAP at night please bring your mask the morning of surgery   Remember that you must have someone to transport you home after your surgery, and remain with you for 24 hours if you are discharged the same day.  Please bring cases for contacts, glasses, hearing aids, dentures or bridgework because it cannot be worn into surgery.   Patients discharged the day of surgery will not be allowed to drive home.   Please shower the NIGHT BEFORE/MORNING OF SURGERY (use antibacterial soap like DIAL soap if possible). Wear comfortable clothes the morning of surgery. Oral Hygiene is also important to reduce your risk of infection.  Remember - BRUSH YOUR TEETH THE MORNING OF SURGERY WITH YOUR REGULAR TOOTHPASTE  Patient denies shortness of breath, fever, cough and chest pain.

## 2021-06-18 ENCOUNTER — Inpatient Hospital Stay (HOSPITAL_COMMUNITY): Payer: 59 | Admitting: Anesthesiology

## 2021-06-18 ENCOUNTER — Encounter (HOSPITAL_COMMUNITY): Payer: Self-pay | Admitting: Orthopaedic Surgery

## 2021-06-18 ENCOUNTER — Encounter (HOSPITAL_COMMUNITY)
Admission: RE | Disposition: A | Discharge status: Home / Self Care | Payer: Self-pay | Source: Home / Self Care | Attending: Orthopaedic Surgery

## 2021-06-18 ENCOUNTER — Ambulatory Visit (HOSPITAL_COMMUNITY)
Admission: RE | Admit: 2021-06-18 | Discharge: 2021-06-18 | Disposition: A | Discharge status: Home / Self Care | Payer: 59 | Attending: Orthopaedic Surgery | Admitting: Orthopaedic Surgery

## 2021-06-18 ENCOUNTER — Encounter: Payer: 59 | Admitting: Physical Medicine and Rehabilitation

## 2021-06-18 ENCOUNTER — Other Ambulatory Visit: Payer: Self-pay

## 2021-06-18 DIAGNOSIS — Z794 Long term (current) use of insulin: Secondary | ICD-10-CM | POA: Insufficient documentation

## 2021-06-18 DIAGNOSIS — Z7984 Long term (current) use of oral hypoglycemic drugs: Secondary | ICD-10-CM | POA: Insufficient documentation

## 2021-06-18 DIAGNOSIS — E1152 Type 2 diabetes mellitus with diabetic peripheral angiopathy with gangrene: Secondary | ICD-10-CM | POA: Insufficient documentation

## 2021-06-18 DIAGNOSIS — Z79899 Other long term (current) drug therapy: Secondary | ICD-10-CM | POA: Insufficient documentation

## 2021-06-18 DIAGNOSIS — I96 Gangrene, not elsewhere classified: Secondary | ICD-10-CM | POA: Diagnosis not present

## 2021-06-18 DIAGNOSIS — Y835 Amputation of limb(s) as the cause of abnormal reaction of the patient, or of later complication, without mention of misadventure at the time of the procedure: Secondary | ICD-10-CM | POA: Diagnosis not present

## 2021-06-18 DIAGNOSIS — T8781 Dehiscence of amputation stump: Principal | ICD-10-CM | POA: Insufficient documentation

## 2021-06-18 DIAGNOSIS — Z20822 Contact with and (suspected) exposure to covid-19: Secondary | ICD-10-CM | POA: Insufficient documentation

## 2021-06-18 DIAGNOSIS — Z89439 Acquired absence of unspecified foot: Secondary | ICD-10-CM

## 2021-06-18 HISTORY — PX: SECONDARY CLOSURE OF WOUND: SHX6208

## 2021-06-18 LAB — BASIC METABOLIC PANEL
Anion gap: 13 (ref 5–15)
BUN: 8 mg/dL (ref 6–20)
CO2: 19 mmol/L — ABNORMAL LOW (ref 22–32)
Calcium: 8.2 mg/dL — ABNORMAL LOW (ref 8.9–10.3)
Chloride: 107 mmol/L (ref 98–111)
Creatinine, Ser: 0.9 mg/dL (ref 0.44–1.00)
GFR, Estimated: >60 mL/min (ref >60)
Glucose, Bld: 138 mg/dL — ABNORMAL HIGH (ref 70–99)
Potassium: 2.9 mmol/L — ABNORMAL LOW (ref 3.5–5.1)
Sodium: 139 mmol/L (ref 135–145)

## 2021-06-18 LAB — CBC
HCT: 37 % (ref 36.0–46.0)
Hemoglobin: 11.6 g/dL — ABNORMAL LOW (ref 12.0–15.0)
MCH: 29.7 pg (ref 26.0–34.0)
MCHC: 31.4 g/dL (ref 30.0–36.0)
MCV: 94.6 fL (ref 80.0–100.0)
Platelets: 426 10*3/uL — ABNORMAL HIGH (ref 150–400)
RBC: 3.91 MIL/uL (ref 3.87–5.11)
RDW: 15.1 % (ref 11.5–15.5)
WBC: 18.1 10*3/uL — ABNORMAL HIGH (ref 4.0–10.5)
nRBC: 0 % (ref 0.0–0.2)

## 2021-06-18 LAB — HCG, SERUM, QUALITATIVE: Preg, Serum: NEGATIVE

## 2021-06-18 LAB — GLUCOSE, CAPILLARY
Glucose-Capillary: 135 mg/dL — ABNORMAL HIGH (ref 70–99)
Glucose-Capillary: 128 mg/dL — ABNORMAL HIGH (ref 70–99)
Glucose-Capillary: 122 mg/dL — ABNORMAL HIGH (ref 70–99)

## 2021-06-18 LAB — SARS CORONAVIRUS 2 BY RT PCR (HOSPITAL ORDER, PERFORMED IN ~~LOC~~ HOSPITAL LAB): SARS Coronavirus 2: NEGATIVE

## 2021-06-18 SURGERY — SECONDARY CLOSURE OF WOUND
Anesthesia: General | Laterality: Right

## 2021-06-18 MED ORDER — ONDANSETRON HCL 4 MG/2ML IJ SOLN: 4.0000 mg | Freq: Once | INTRAMUSCULAR | Status: DC | PRN

## 2021-06-18 MED ORDER — FENTANYL CITRATE (PF) 100 MCG/2ML IJ SOLN
25.0000 ug | INTRAMUSCULAR | Status: DC | PRN
  Administered 2021-06-18 (×2): 25 ug via INTRAVENOUS

## 2021-06-18 MED ORDER — DEXAMETHASONE SODIUM PHOSPHATE 10 MG/ML IJ SOLN
INTRAMUSCULAR | Status: DC | PRN
  Administered 2021-06-18: 5 mg via INTRAVENOUS

## 2021-06-18 MED ORDER — FENTANYL CITRATE (PF) 100 MCG/2ML IJ SOLN
INTRAMUSCULAR | Status: AC
  Filled 2021-06-18: qty 2

## 2021-06-18 MED ORDER — CEFAZOLIN SODIUM-DEXTROSE 2-4 GM/100ML-% IV SOLN
INTRAVENOUS | Status: AC
  Filled 2021-06-18: qty 100

## 2021-06-18 MED ORDER — METOPROLOL SUCCINATE ER 25 MG PO TB24
ORAL_TABLET | ORAL | Status: AC
  Filled 2021-06-18: qty 4

## 2021-06-18 MED ORDER — EPHEDRINE SULFATE-NACL 50-0.9 MG/10ML-% IV SOSY
PREFILLED_SYRINGE | INTRAVENOUS | Status: DC | PRN
  Administered 2021-06-18 (×2): 5 mg via INTRAVENOUS

## 2021-06-18 MED ORDER — VANCOMYCIN HCL 500 MG IV SOLR
INTRAVENOUS | Status: AC
  Filled 2021-06-18: qty 10

## 2021-06-18 MED ORDER — MIDAZOLAM HCL 2 MG/2ML IJ SOLN
INTRAMUSCULAR | Status: AC
  Filled 2021-06-18: qty 2

## 2021-06-18 MED ORDER — ORAL CARE MOUTH RINSE: 15.0000 mL | Freq: Once | OROMUCOSAL | Status: AC

## 2021-06-18 MED ORDER — PROPOFOL 10 MG/ML IV BOLUS
INTRAVENOUS | Status: AC
  Filled 2021-06-18: qty 20

## 2021-06-18 MED ORDER — 0.9 % SODIUM CHLORIDE (POUR BTL) OPTIME
TOPICAL | Status: DC | PRN
  Administered 2021-06-18: 1000 mL

## 2021-06-18 MED ORDER — OXYCODONE HCL 5 MG PO TABS: 5.0000 mg | ORAL_TABLET | ORAL | 0 refills | Status: AC | PRN

## 2021-06-18 MED ORDER — ONDANSETRON HCL 4 MG/2ML IJ SOLN
INTRAMUSCULAR | Status: DC | PRN
  Administered 2021-06-18: 4 mg via INTRAVENOUS

## 2021-06-18 MED ORDER — CEFAZOLIN SODIUM-DEXTROSE 2-3 GM-%(50ML) IV SOLR
INTRAVENOUS | Status: DC | PRN
  Administered 2021-06-18: 2 g via INTRAVENOUS

## 2021-06-18 MED ORDER — FENTANYL CITRATE (PF) 250 MCG/5ML IJ SOLN
INTRAMUSCULAR | Status: DC | PRN
  Administered 2021-06-18: 25 ug via INTRAVENOUS
  Administered 2021-06-18: 50 ug via INTRAVENOUS

## 2021-06-18 MED ORDER — METOPROLOL SUCCINATE ER 100 MG PO TB24
100.0000 mg | ORAL_TABLET | Freq: Once | ORAL | Status: AC
  Administered 2021-06-18: 100 mg via ORAL
  Filled 2021-06-18: qty 1

## 2021-06-18 MED ORDER — VANCOMYCIN HCL 500 MG IV SOLR
INTRAVENOUS | Status: DC | PRN
  Administered 2021-06-18: 500 mg via TOPICAL

## 2021-06-18 MED ORDER — LIDOCAINE 2% (20 MG/ML) 5 ML SYRINGE
INTRAMUSCULAR | Status: DC | PRN
  Administered 2021-06-18: 80 mg via INTRAVENOUS

## 2021-06-18 MED ORDER — SULFAMETHOXAZOLE-TRIMETHOPRIM 800-160 MG PO TABS: 1.0000 | ORAL_TABLET | Freq: Two times a day (BID) | ORAL | 0 refills | Status: DC

## 2021-06-18 MED ORDER — CHLORHEXIDINE GLUCONATE 0.12 % MT SOLN
15.0000 mL | Freq: Once | OROMUCOSAL | Status: AC
  Administered 2021-06-18: 15 mL via OROMUCOSAL
  Filled 2021-06-18: qty 15

## 2021-06-18 MED ORDER — SODIUM CHLORIDE 0.9 % IR SOLN
Status: DC | PRN
  Administered 2021-06-18: 3000 mL

## 2021-06-18 MED ORDER — FENTANYL CITRATE (PF) 250 MCG/5ML IJ SOLN
INTRAMUSCULAR | Status: AC
  Filled 2021-06-18: qty 5

## 2021-06-18 MED ORDER — LACTATED RINGERS IV SOLN: INTRAVENOUS | Status: DC

## 2021-06-18 MED ORDER — MIDAZOLAM HCL 2 MG/2ML IJ SOLN
INTRAMUSCULAR | Status: DC | PRN
  Administered 2021-06-18: 2 mg via INTRAVENOUS

## 2021-06-18 MED ORDER — PROPOFOL 10 MG/ML IV BOLUS
INTRAVENOUS | Status: DC | PRN
  Administered 2021-06-18: 180 mg via INTRAVENOUS

## 2021-06-18 MED ORDER — OXYCODONE HCL 5 MG PO TABS: 5.0000 mg | ORAL_TABLET | Freq: Once | ORAL | Status: DC | PRN

## 2021-06-18 MED ORDER — OXYCODONE HCL 5 MG/5ML PO SOLN: 5.0000 mg | Freq: Once | ORAL | Status: DC | PRN

## 2021-06-18 SURGICAL SUPPLY — 44 items
BAG COUNTER SPONGE SURGICOUNT (BAG) IMPLANT
BANDAGE ESMARK 6X9 LF (GAUZE/BANDAGES/DRESSINGS) IMPLANT
BNDG COHESIVE 4X5 TAN STRL (GAUZE/BANDAGES/DRESSINGS) IMPLANT
BNDG ELASTIC 6X10 VLCR STRL LF (GAUZE/BANDAGES/DRESSINGS) ×2 IMPLANT
BNDG ELASTIC 6X5.8 VLCR STR LF (GAUZE/BANDAGES/DRESSINGS) ×2 IMPLANT
BNDG ESMARK 4X9 LF (GAUZE/BANDAGES/DRESSINGS) IMPLANT
BNDG ESMARK 6X9 LF (GAUZE/BANDAGES/DRESSINGS)
BNDG GAUZE ELAST 4 BULKY (GAUZE/BANDAGES/DRESSINGS) ×2 IMPLANT
COVER SURGICAL LIGHT HANDLE (MISCELLANEOUS) ×4 IMPLANT
CUFF TOURN SGL QUICK 34 (TOURNIQUET CUFF) ×2
CUFF TRNQT CYL 34X4.125X (TOURNIQUET CUFF) ×1 IMPLANT
DRAPE U-SHAPE 47X51 STRL (DRAPES) ×2 IMPLANT
DRSG PAD ABDOMINAL 8X10 ST (GAUZE/BANDAGES/DRESSINGS) IMPLANT
DRSG XEROFORM 1X8 (GAUZE/BANDAGES/DRESSINGS) IMPLANT
DURAPREP 26ML APPLICATOR (WOUND CARE) ×2 IMPLANT
ELECT REM PT RETURN 9FT ADLT (ELECTROSURGICAL) ×2
ELECTRODE REM PT RTRN 9FT ADLT (ELECTROSURGICAL) ×1 IMPLANT
GAUZE SPONGE 4X4 12PLY STRL (GAUZE/BANDAGES/DRESSINGS) IMPLANT
GAUZE SPONGE 4X4 12PLY STRL LF (GAUZE/BANDAGES/DRESSINGS) ×2 IMPLANT
GAUZE XEROFORM 1X8 LF (GAUZE/BANDAGES/DRESSINGS) ×2 IMPLANT
GLOVE SURG ENC TEXT LTX SZ7.5 (GLOVE) ×2 IMPLANT
GLOVE SURG UNDER LTX SZ8 (GLOVE) ×2 IMPLANT
GOWN STRL REUS W/ TWL LRG LVL3 (GOWN DISPOSABLE) ×1 IMPLANT
GOWN STRL REUS W/ TWL XL LVL3 (GOWN DISPOSABLE) ×1 IMPLANT
GOWN STRL REUS W/TWL LRG LVL3 (GOWN DISPOSABLE) ×2
GOWN STRL REUS W/TWL XL LVL3 (GOWN DISPOSABLE) ×2
HANDPIECE INTERPULSE COAX TIP (DISPOSABLE) ×2
KIT BASIN OR (CUSTOM PROCEDURE TRAY) ×2 IMPLANT
KIT TURNOVER KIT B (KITS) ×2 IMPLANT
MANIFOLD NEPTUNE II (INSTRUMENTS) ×2 IMPLANT
NEEDLE 22X1 1/2 (OR ONLY) (NEEDLE) IMPLANT
NS IRRIG 1000ML POUR BTL (IV SOLUTION) ×2 IMPLANT
PACK ORTHO EXTREMITY (CUSTOM PROCEDURE TRAY) ×2 IMPLANT
PAD ARMBOARD 7.5X6 YLW CONV (MISCELLANEOUS) ×4 IMPLANT
PAD CAST 4YDX4 CTTN HI CHSV (CAST SUPPLIES) ×1 IMPLANT
PADDING CAST COTTON 4X4 STRL (CAST SUPPLIES) ×2
PADDING CAST COTTON 6X4 STRL (CAST SUPPLIES) ×2 IMPLANT
SET HNDPC FAN SPRY TIP SCT (DISPOSABLE) ×1 IMPLANT
SPONGE T-LAP 18X18 ~~LOC~~+RFID (SPONGE) IMPLANT
STOCKINETTE IMPERVIOUS 9X36 MD (GAUZE/BANDAGES/DRESSINGS) IMPLANT
SUT ETHILON 2 0 FS 18 (SUTURE) IMPLANT
TUBE CONNECTING 12X1/4 (SUCTIONS) ×2 IMPLANT
UNDERPAD 30X36 HEAVY ABSORB (UNDERPADS AND DIAPERS) ×2 IMPLANT
YANKAUER SUCT BULB TIP NO VENT (SUCTIONS) ×2 IMPLANT

## 2021-06-18 NOTE — Anesthesia Procedure Notes (Signed)
Procedure Name: LMA Insertion Date/Time: 06/18/2021 11:38 AM Performed by: Yehuda Mao, CRNA Pre-anesthesia Checklist: Patient identified, Emergency Drugs available, Suction available, Patient being monitored and Timeout performed Patient Re-evaluated:Patient Re-evaluated prior to induction Oxygen Delivery Method: Circle system utilized Preoxygenation: Pre-oxygenation with 100% oxygen Induction Type: IV induction LMA: LMA inserted LMA Size: 4.0 Number of attempts: 1

## 2021-06-18 NOTE — Anesthesia Preprocedure Evaluation (Signed)
Anesthesia Evaluation  Patient identified by MRN, date of birth, ID band Patient awake    Reviewed: Allergy & Precautions, NPO status , Patient's Chart, lab work & pertinent test results, reviewed documented beta blocker date and time   Airway Mallampati: III  TM Distance: >3 FB Neck ROM: Full    Dental no notable dental hx. (+) Dental Advisory Given   Pulmonary asthma , PE   Pulmonary exam normal breath sounds clear to auscultation       Cardiovascular hypertension, Pt. on medications and Pt. on home beta blockers +CHF  Normal cardiovascular exam Rhythm:Regular Rate:Normal     Neuro/Psych CVA, Residual Symptoms negative psych ROS   GI/Hepatic Neg liver ROS, GERD  Medicated and Controlled,  Endo/Other  diabetes, Poorly Controlled, Insulin DependentMorbid obesity  Renal/GU Renal InsufficiencyRenal disease  negative genitourinary   Musculoskeletal Open wound right foot S/P Trans metatarsal amputation   Abdominal (+) + obese,   Peds  Hematology  (+) anemia , Xarelto therapy- last dose   Anesthesia Other Findings   Reproductive/Obstetrics                             Anesthesia Physical Anesthesia Plan  ASA: 3  Anesthesia Plan: General   Post-op Pain Management:    Induction: Intravenous  PONV Risk Score and Plan: 4 or greater and Treatment may vary due to age or medical condition, Midazolam and Ondansetron  Airway Management Planned: LMA  Additional Equipment:   Intra-op Plan:   Post-operative Plan: Extubation in OR  Informed Consent: I have reviewed the patients History and Physical, chart, labs and discussed the procedure including the risks, benefits and alternatives for the proposed anesthesia with the patient or authorized representative who has indicated his/her understanding and acceptance.     Dental advisory given  Plan Discussed with: CRNA and  Anesthesiologist  Anesthesia Plan Comments:         Anesthesia Quick Evaluation

## 2021-06-18 NOTE — Anesthesia Postprocedure Evaluation (Signed)
Anesthesia Post Note  Patient: Sherri Dixon  Procedure(s) Performed: SECONDARY CLOSURE OF WOUND tRANSMETATARSAL AMPUTAION (Right)     Patient location during evaluation: PACU Anesthesia Type: General Level of consciousness: awake and alert and oriented Pain management: pain level controlled Vital Signs Assessment: post-procedure vital signs reviewed and stable Respiratory status: spontaneous breathing, nonlabored ventilation and respiratory function stable Cardiovascular status: blood pressure returned to baseline and stable Postop Assessment: no apparent nausea or vomiting Anesthetic complications: no   No notable events documented.  Last Vitals:  Vitals:   06/18/21 1300 06/18/21 1315  BP: (!) 183/88 (!) 180/85  Pulse: 81 78  Resp: 18 17  Temp:  36.5 C  SpO2: 100% 100%    Last Pain:  Vitals:   06/18/21 1315  TempSrc:   PainSc: 3                  [name] A.

## 2021-06-18 NOTE — Discharge Instructions (Signed)
DR. Lucia Gaskins FOOT & ANKLE SURGERY POST-OP INSTRUCTIONS   Pain Management The numbing medicine and your leg will last around 18 hours, take a dose of your pain medicine as soon as you feel it wearing off to avoid rebound pain. Keep your foot elevated above heart level.  Make sure that your heel hangs free ('floats'). Take all prescribed medication as directed. If taking narcotic pain medication you may want to use an over-the-counter stool softener to avoid constipation. You may take over-the-counter NSAIDs (ibuprofen, naproxen, etc.) as well as over-the-counter acetaminophen as directed on the packaging as a supplement for your pain and may also use it to wean away from the prescription medication.  Activity Heel WB  Keep dressing in place  First Postoperative Visit Your first postop visit will be at least 2 weeks after surgery.  This should be scheduled when you schedule surgery. If you do not have a postoperative visit scheduled please call [phone] to schedule an appointment. At the appointment your incision will be evaluated for suture removal, x-rays will be obtained if necessary.  General Instructions Swelling is very common after foot and ankle surgery.  It often takes 3 months for the foot and ankle to begin to feel comfortable.  Some amount of swelling will persist for 6-12 months. DO NOT change the dressing.  If there is a problem with the dressing (too tight, loose, gets wet, etc.) please contact Dr. Pollie Friar office. DO NOT get the dressing wet.  For showers you can use an over-the-counter cast cover or wrap a washcloth around the top of your dressing and then cover it with a plastic bag and tape it to your leg. DO NOT soak the incision (no tubs, pools, bath, etc.) until you have approval from Dr. Lucia Gaskins.  Contact Dr. Huel Cote office or go to Emergency Room if: Temperature above 101 F. Increasing pain that is unresponsive to pain medication or elevation Excessive redness or  swelling in your foot Dressing problems - excessive bloody drainage, looseness or tightness, or if dressing gets wet Develop pain, swelling, warmth, or discoloration of your calf

## 2021-06-18 NOTE — Brief Op Note (Signed)
06/18/2021  12:11 PM  PATIENT:  Sherri Dixon  40 y.o. female  PRE-OPERATIVE DIAGNOSIS:  Right TRANSMARTARSAL OF WOUND BREAKDOWN  POST-OPERATIVE DIAGNOSIS:  Right TRANSMARTARSAL OF WOUND BREAKDOWN  PROCEDURE:   Repair wound dehischence Application of wound vac  SURGEON:  Surgeon(s) and Role:    Erle Crocker, MD - Primary  PHYSICIAN ASSISTANT:   ASSISTANTS: none   ANESTHESIA:   general  EBL:  minimal  BLOOD ADMINISTERED:none  DRAINS: none   LOCAL MEDICATIONS USED:  NONE  SPECIMEN:  Source of Specimen:  wound right foot  DISPOSITION OF SPECIMEN:   Micro  COUNTS:  YES  TOURNIQUET:  * No tourniquets in log *  DICTATION: .Dragon Dictation  PLAN OF CARE: Discharge to home after PACU  PATIENT DISPOSITION:  PACU - hemodynamically stable.   Delay start of Pharmacological VTE agent (>24hrs) due to surgical blood loss or risk of bleeding: not applicable

## 2021-06-18 NOTE — H&P (Signed)
PREOPERATIVE H&P  Chief Complaint: Right foot wound  HPI: Sherri Dixon is a 40 y.o. female who presents for preoperative history and physical with a diagnosis of right foot wound dehiscence.  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  She healed most of her wounds that had some eschar formation on the top of her foot at the most proximal portion of the wound.  Given this she had some fibrinous drainage and recently began developing a fever and malaise.  She is here today for review pair of the wound dehiscence with possible revision transmetatarsal amputation. Symptoms are rated as moderate to severe, and have been worsening.  This is significantly impairing activities of daily living.  She has elected for surgical management.   Past Medical History:  Diagnosis Date   Anemia    Asthma    CHF (congestive heart failure) (HCC)    Pt reports that father has CHF, not her   Diabetes mellitus without complication ([place])    Dyslipidemia    GERD (gastroesophageal reflux disease)    Pulmonary embolism ([place])    Stroke [facility])    Past Surgical History:  Procedure Laterality Date   APPLICATION OF WOUND VAC Right 04/12/2021   Procedure: APPLICATION OF WOUND VAC;  Surgeon: Erle Crocker, MD;  Location: [place];  Service: Orthopedics;  Laterality: Right;   I & D EXTREMITY Right 04/06/2021   Procedure: IRRIGATION AND DEBRIDEMENT TOE WITH WOUND VAC PLACEMENT;  Surgeon: Erle Crocker, MD;  Location: [place];  Service: Orthopedics;  Laterality: Right;   I & D EXTREMITY Right 04/12/2021   Procedure: IRRIGATION AND DEBRIDEMENT RIGHT FOOT;  Surgeon: Erle Crocker, MD;  Location: [place];  Service: Orthopedics;  Laterality: Right;   MOUTH SURGERY     ROBOTIC ASSITED PARTIAL NEPHRECTOMY Right 07/28/2019   Procedure: XI ROBOTIC ASSITED PARTIAL NEPHRECTOMY;  Surgeon: Cleon Gustin, MD;  Location: [facility];  Service: Urology;  Laterality: Right;  3 HRS    TRANSMETATARSAL AMPUTATION Right 04/12/2021   Procedure: TRANSMETATARSAL AMPUTATION;  Surgeon: Erle Crocker, MD;  Location: [place];  Service: Orthopedics;  Laterality: Right;   Social History   Socioeconomic History   Marital status: Married    Spouse name: Not on file   Number of children: Not on file   Years of education: Not on file   Highest education level: Not on file  Occupational History   Not on file  Tobacco Use   Smoking status: Never   Smokeless tobacco: Never  Vaping Use   Vaping Use: Never used  Substance and Sexual Activity   Alcohol use: Yes    Alcohol/week: 1.0 standard drink    Types: 1 Glasses of wine per week    Comment: occas   Drug use: No   Sexual activity: Yes    Birth control/protection: None  Other Topics Concern   Not on file  Social History Narrative   Not on file   Social Determinants of Health   Financial Resource Strain: Not on file  Food Insecurity: Not on file  Transportation Needs: Not on file  Physical Activity: Not on file  Stress: Not on file  Social Connections: Not on file   Family History  Problem Relation Age of Onset   High blood pressure Mother    Diabetes Mother    Arthritis Mother    Hyperlipidemia Mother    High blood pressure Father    Hyperlipidemia Father    High blood  pressure Sister    Diabetes Brother    Hyperlipidemia Brother    High blood pressure Brother    Allergies  Allergen Reactions   Amlodipine Swelling   Humalog Kwikpen [Insulin Lispro] Other (See Comments)    75/50---swelling and ulcers in mouth    Prior to Admission medications   Medication Sig Start Date End Date Taking? Authorizing Provider  dapagliflozin propanediol (FARXIGA) 10 MG TABS tablet Take 10 mg by mouth daily.   Yes [provider]  furosemide (LASIX) 20 MG tablet Take 1 tablet (20 mg total) by mouth 2 (two) times daily. 04/25/21  Yes Love, Ivan Anchors, PA-C  gabapentin (NEURONTIN) 300 MG capsule Take 300 mg by mouth  daily as needed (pain).   Yes [provider]  hydrALAZINE (APRESOLINE) 50 MG tablet Take 1 tablet (50 mg total) by mouth 3 (three) times daily. 12/15/20  Yes Bonnielee Haff, MD  ibuprofen (ADVIL) 200 MG tablet Take 400 mg by mouth every 6 (six) hours as needed for headache, fever or mild pain.   Yes [provider]  insulin aspart (NOVOLOG FLEXPEN) 100 UNIT/ML FlexPen Inject 0-20 Units as directed 3 (three) times daily. Sliding scale 01/03/19  Yes [provider]  metoprolol succinate (TOPROL-XL) 100 MG 24 hr tablet Take 1 tablet (100 mg total) by mouth daily. 04/25/21  Yes Love, Ivan Anchors, PA-C  mineral oil-hydrophilic petrolatum (AQUAPHOR) ointment Apply 1 application topically as needed (eczema).   Yes [provider]  olmesartan (BENICAR) 40 MG tablet Take 1 tablet (40 mg total) by mouth daily. 01/30/21  Yes Thurnell Lose, MD  ondansetron (ZOFRAN) 4 MG tablet Take 1 tablet (4 mg total) by mouth daily as needed for nausea or vomiting. 04/25/21 04/25/22 Yes Love, Ivan Anchors, PA-C  OVER THE COUNTER MEDICATION Take 1 tablet by mouth daily. [place] vitamin   Yes [provider]  oxyCODONE (OXY IR/ROXICODONE) 5 MG immediate release tablet Take 1-2 tablets (5-10 mg total) by mouth every 8 (eight) hours as needed for moderate pain or severe pain. 04/25/21  Yes Love, Ivan Anchors, PA-C  polyvinyl alcohol (LIQUIFILM TEARS) 1.4 % ophthalmic solution Place 1 drop into both eyes as needed for dry eyes.   Yes [provider]  rosuvastatin (CRESTOR) 40 MG tablet Take 1 tablet (40 mg total) by mouth daily. 01/29/21  Yes Thurnell Lose, MD  Vitamin D, Ergocalciferol, (DRISDOL) 1.25 MG (50000 UNIT) CAPS capsule Take 50,000 Units by mouth once a week. 03/19/21  Yes [provider]  XARELTO 20 MG TABS tablet Take 20 mg by mouth daily. 10/15/20  Yes [provider]  acetaminophen (TYLENOL) 325 MG tablet Take 1-2 tablets (325-650 mg total) by mouth every 4  (four) hours as needed for mild pain. Patient not taking: Reported on 06/18/2021 04/25/21   Bary Leriche, PA-C  Alcohol Swabs 70 % PADS 1 application by Does not apply route 3 (three) times daily before meals. 06/06/16   Dhungel, Nishant, MD  docusate sodium (COLACE) 100 MG capsule Take 2 capsules (200 mg total) by mouth daily. Patient not taking: Reported on 06/18/2021 04/26/21   Bary Leriche, PA-C  glucose blood test strip Use as instructed 06/06/16   Dhungel, Nishant, MD  Insulin Syringe-Needle U-100 (INSULIN SYRINGE 1CC/28G) 28G X 1/2" 1 ML MISC 1 application by Does not apply route 3 (three) times daily before meals. 06/06/16   Dhungel, Nishant, MD  magnesium oxide (MAG-OX) 400 MG tablet Take 1 tablet (400 mg  total) by mouth daily. Patient not taking: Reported on 06/18/2021 04/26/21   Love, Ivan Anchors, PA-C  nutrition supplement, JUVEN, (JUVEN) PACK Take 1 packet by mouth 2 (two) times daily between meals. Patient not taking: Reported on 06/18/2021 04/25/21   Love, Ivan Anchors, PA-C  pantoprazole (PROTONIX) 40 MG tablet Take 1 tablet (40 mg total) by mouth daily at 6 PM. Patient not taking: Reported on 06/18/2021 04/25/21   Love, Ivan Anchors, PA-C  potassium chloride (KLOR-CON) 10 MEQ tablet Take 3 tablets (30 mEq total) by mouth 2 (two) times daily. Patient not taking: Reported on 06/18/2021 04/25/21   Love, Ivan Anchors, PA-C  Zinc Sulfate 220 (50 Zn) MG TABS Take 1 tablet (220 mg total) by mouth daily. Patient not taking: Reported on 06/18/2021 04/25/21   Bary Leriche, PA-C     Positive ROS: All other systems have been reviewed and were otherwise negative with the exception of those mentioned in the HPI and as above.  Physical Exam:  Vitals:   06/18/21 0845  BP: (!) 184/87  Pulse: 79  Resp: 18  Temp: 98.2 F (36.8 C)  SpO2: 98%   General: Alert, no acute distress Cardiovascular: No pedal edema Respiratory: No cyanosis, no use of accessory musculature GI: No organomegaly, abdomen is soft and  non-tender Skin: No lesions in the area of chief complaint Neurologic: Sensation intact distally Psychiatric: Patient is competent for consent with normal mood and affect Lymphatic: No axillary or cervical lymphadenopathy  MUSCULOSKELETAL: Right foot with dressing in place.  There is a small area of wound dehiscence proximal the wound.  There is eschar around this area.  There is fibrinous drainage.  No significant foul odor.  No obvious purulence.  No streaking erythema.  She is able to range her ankle without pain.  Assessment: Right foot status post transmetatarsal amputation with proximal wound dehiscence and eschar   Plan: Plan for wound debridement with repair of the wound dehiscence.  May require shortening of the metatarsals if soft tissues difficult to close.  We will place her postoperatively on oral antibiotics.  She will touch base with her infectious disease doctor about resuming antibiotic treatment.  We discussed the risks, benefits and alternatives of surgery which include but are not limited to wound healing complications, infection, nonunion, malunion, need for further surgery, damage to surrounding structures and continued pain.  They understand there is no guarantees to an acceptable outcome.  After weighing these risks they opted to proceed with surgery.     Erle Crocker, MD    06/18/2021 10:45 AM

## 2021-06-18 NOTE — Progress Notes (Signed)
Dr. Royce Macadamia notified of K of 2.9. New new interventions at this time.

## 2021-06-18 NOTE — Transfer of Care (Signed)
Immediate Anesthesia Transfer of Care Note  Patient: Sherri Dixon  Procedure(s) Performed: SECONDARY CLOSURE OF WOUND tRANSMETATARSAL AMPUTAION (Right)  Patient Location: PACU  Anesthesia Type:General  Level of Consciousness: drowsy, patient cooperative and responds to stimulation  Airway & Oxygen Therapy: Patient Spontanous Breathing  Post-op Assessment: Report given to RN and Post -op Vital signs reviewed and stable  Post vital signs: Reviewed and stable  Last Vitals:  Vitals Value Taken Time  BP 156/82 06/18/21 1214  Temp    Pulse 89 06/18/21 1217  Resp 23 06/18/21 1217  SpO2 100 % 06/18/21 1217  Vitals shown include unvalidated device data.  Last Pain:  Vitals:   06/18/21 0913  TempSrc:   PainSc: 0-No pain      Patients Stated Pain Goal: 4 (14/48/18 5631)  Complications: No notable events documented.

## 2021-06-19 ENCOUNTER — Encounter (HOSPITAL_COMMUNITY): Payer: Self-pay | Admitting: Orthopaedic Surgery

## 2021-06-23 LAB — AEROBIC/ANAEROBIC CULTURE W GRAM STAIN (SURGICAL/DEEP WOUND)

## 2021-06-25 ENCOUNTER — Encounter: Payer: 59 | Attending: Physical Medicine and Rehabilitation | Admitting: Physical Medicine and Rehabilitation

## 2021-06-25 ENCOUNTER — Other Ambulatory Visit: Payer: Self-pay

## 2021-06-25 DIAGNOSIS — Z89431 Acquired absence of right foot: Secondary | ICD-10-CM | POA: Diagnosis not present

## 2021-06-25 DIAGNOSIS — R630 Anorexia: Secondary | ICD-10-CM | POA: Diagnosis not present

## 2021-06-25 DIAGNOSIS — R29898 Other symptoms and signs involving the musculoskeletal system: Secondary | ICD-10-CM | POA: Insufficient documentation

## 2021-06-25 NOTE — Progress Notes (Addendum)
   Subjective:    Patient ID: Sherri Dixon, female    DOB: 20-Mar-1981, 40 y.o.   MRN: [id number]  HPI An audio/video tele-health visit is felt to be the most appropriate encounter for this patient at this time. This is a follow up tele-visit via phone. The patient is at home. MD is at office.    Sherri Dixon is a 40 year old woman who presents for hospital follow-up after CIR admission for right transmetatarsal amputation.   -Sherri Dixon has not been having an appetite.  -She has been having diarrhea.  -She asks whether her antibiotic can be changed as the pill is big for her to swallow and she has thrown it up- she says she has not taken it in two days -Last week when I called her she was in the hospital for secondary closure of her wound -She has hospital follow-up with me on Monday.     Review of Systems +nausea, decreased appetite    Objective:   Physical Exam Not performed as patient was seen via phone       Assessment & Plan:  Sherri Dixon is a 40 year old woman who presents for follow-up after CIR admission for transmetatarsal amputation  1) Decreased appetite/impaired ability to swallow antibiotic -may benefit from SLP -discussed with her the importance of taking her antibiotic as prescribed. Discussed to call surgeon and discuss alternative antibiotics if she cannot swallow current Bactrim  2) Transmetatarsal amputation -denies pain -wound vac in place  6 minutes spent in discussion of her decreased appetite, inability to swallow antibiotic, educating her regarding the importance of taking her antibiotic, discussing her wound and pain

## 2021-06-26 NOTE — Progress Notes (Signed)
This encounter was created in error - please disregard.

## 2021-06-27 ENCOUNTER — Other Ambulatory Visit: Payer: Self-pay

## 2021-06-27 ENCOUNTER — Encounter: Payer: Self-pay | Admitting: Physical Medicine and Rehabilitation

## 2021-06-27 ENCOUNTER — Encounter (HOSPITAL_BASED_OUTPATIENT_CLINIC_OR_DEPARTMENT_OTHER): Payer: 59 | Admitting: Physical Medicine and Rehabilitation

## 2021-06-27 VITALS — BP 179/93 | HR 66 | Temp 97.7°F | Ht 62.0 in | Wt 242.2 lb

## 2021-06-27 DIAGNOSIS — Z89431 Acquired absence of right foot: Secondary | ICD-10-CM | POA: Diagnosis present

## 2021-06-27 DIAGNOSIS — R29898 Other symptoms and signs involving the musculoskeletal system: Secondary | ICD-10-CM | POA: Diagnosis present

## 2021-06-27 DIAGNOSIS — R630 Anorexia: Secondary | ICD-10-CM | POA: Diagnosis present

## 2021-06-27 MED ORDER — GABAPENTIN 300 MG PO CAPS: 300.0000 mg | ORAL_CAPSULE | Freq: Every day | ORAL | 3 refills | Status: DC | PRN

## 2021-06-27 NOTE — Progress Notes (Deleted)
Subjective:    Patient ID: Sherri Dixon, female    DOB: [birth year], 40 y.o.   MRN: [id number]  HPI  Pain Inventory Average Pain {NUMBERS; 0-10:5044} Pain Right Now {NUMBERS; 0-10:5044} My pain is {PAIN DESCRIPTION:21022940}  In the last 24 hours, has pain interfered with the following? General activity {NUMBERS; 0-10:5044} Relation with others {NUMBERS; 0-10:5044} Enjoyment of life {NUMBERS; 0-10:5044} What TIME of day is your pain at its worst? {time of day:24191} Sleep (in general) {BHH GOOD/FAIR/POOR:22877}  Pain is worse with: {ACTIVITIES:21022942} Pain improves with: {PAIN IMPROVES EVOJ:50093818} Relief from Meds: {NUMBERS; 0-10:5044}  {MOBILITY EXH:37169678}  {FUNCTION:21022946}  {NEURO/PSYCH:21022948}  {CPRM PRIOR STUDIES:21022953}  {CPRM PHYSICIANS INVOLVED IN YOUR CARE:21022954}    Family History  Problem Relation Age of Onset   High blood pressure Mother    Diabetes Mother    Arthritis Mother    Hyperlipidemia Mother    High blood pressure Father    Hyperlipidemia Father    High blood pressure Sister    Diabetes Brother    Hyperlipidemia Brother    High blood pressure Brother    Social History   Socioeconomic History   Marital status: Married    Spouse name: Not on file   Number of children: Not on file   Years of education: Not on file   Highest education level: Not on file  Occupational History   Not on file  Tobacco Use   Smoking status: Never   Smokeless tobacco: Never  Vaping Use   Vaping Use: Never used  Substance and Sexual Activity   Alcohol use: Yes    Alcohol/week: 1.0 standard drink    Types: 1 Glasses of wine per week    Comment: occas   Drug use: No   Sexual activity: Yes    Birth control/protection: None  Other Topics Concern   Not on file  Social History Narrative   Not on file   Social Determinants of Health   Financial Resource Strain: Not on file  Food Insecurity: Not on file  Transportation Needs: Not on  file  Physical Activity: Not on file  Stress: Not on file  Social Connections: Not on file   Past Surgical History:  Procedure Laterality Date   APPLICATION OF WOUND VAC Right 04/12/2021   Procedure: APPLICATION OF WOUND VAC;  Surgeon: Erle Crocker, MD;  Location: [place];  Service: Orthopedics;  Laterality: Right;   I & D EXTREMITY Right 04/06/2021   Procedure: IRRIGATION AND DEBRIDEMENT TOE WITH WOUND VAC PLACEMENT;  Surgeon: Erle Crocker, MD;  Location: [place];  Service: Orthopedics;  Laterality: Right;   I & D EXTREMITY Right 04/12/2021   Procedure: IRRIGATION AND DEBRIDEMENT RIGHT FOOT;  Surgeon: Erle Crocker, MD;  Location: [place];  Service: Orthopedics;  Laterality: Right;   MOUTH SURGERY     ROBOTIC ASSITED PARTIAL NEPHRECTOMY Right 07/28/2019   Procedure: XI ROBOTIC ASSITED PARTIAL NEPHRECTOMY;  Surgeon: Cleon Gustin, MD;  Location: [facility];  Service: Urology;  Laterality: Right;  3 HRS   SECONDARY CLOSURE OF WOUND Right 06/18/2021   Procedure: SECONDARY CLOSURE OF WOUND tRANSMETATARSAL AMPUTAION;  Surgeon: Erle Crocker, MD;  Location: [place];  Service: Orthopedics;  Laterality: Right;   TRANSMETATARSAL AMPUTATION Right 04/12/2021   Procedure: TRANSMETATARSAL AMPUTATION;  Surgeon: Erle Crocker, MD;  Location: [place];  Service: Orthopedics;  Laterality: Right;   Past Medical History:  Diagnosis Date   Anemia    Asthma    CHF (  congestive heart failure) (HCC)    Pt reports that father has CHF, not her   Diabetes mellitus without complication (HCC)    Dyslipidemia    GERD (gastroesophageal reflux disease)    Pulmonary embolism ([facility])    Stroke (HCC)    BP (!) 179/93   Pulse 66   Temp 97.7 F (36.5 C)   Ht 5\' 2"  (1.575 m)   Wt 242 lb 3.2 oz (109.9 kg)   SpO2 96%   BMI 44.30 kg/m   Opioid Risk Score:   Fall Risk Score:  `1  Depression screen PHQ 2/9  Depression screen PHQ 2/9 06/27/2021  Decreased Interest 2  Down, Depressed,  Hopeless 1  PHQ - 2 Score 3  Altered sleeping 2  Tired, decreased energy 3  Change in appetite 3  Feeling bad or failure about yourself  2  Trouble concentrating 1  Moving slowly or fidgety/restless 2  Suicidal thoughts 0  PHQ-9 Score 16  Difficult doing work/chores Very difficult     Review of Systems     Objective:   Physical Exam        Assessment & Plan:

## 2021-06-27 NOTE — Progress Notes (Signed)
Subjective:    Patient ID: Sherri Dixon, female    DOB: August 30, 1981, 40 y.o.   MRN: [id number]  HPI  Mrs. Sherri Dixon is a 40 year old woman who presents for hospital follow-up after CIR admission for right transmetatarsal amputation.   -Mrs. Sherri Dixon has not been having an appetite.  -She has been having diarrhea.  -She asks whether her antibiotic can be changed as the pill is big for her to swallow and she has thrown it up- she says she has not taken it in two days -Last week when I called her she was in the hospital for secondary closure of her wound -She has a follow-up with the surgeon tomorrow- she thinks wound vac will be removed tomorrow.  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  -she tries not to take the Gabapentin much because it helps her sleepy. -She has felt so much better Activia ans sea moss   Pain Inventory Average Pain 0 Pain Right Now 0 My pain is no pain  In the last 24 hours, has pain interfered with the following? General activity 0 Relation with others 0 Enjoyment of life 0 What TIME of day is your pain at its worst? no pain Sleep (in general) Poor  Pain is worse with: no pain Pain improves with: no pain Relief from Meds: 5  walk with assistance use a wheelchair needs help with transfers  not employed: date last employed . I need assistance with the following:  dressing, bathing, toileting, meal prep, household duties, and shopping  weakness trouble walking  Any changes since last visit?  yes  Has been back in the hospital since d/c from inpt rehab (surgery)  Any changes since last visit?  no   Review of Systems  Constitutional:  Positive for appetite change.       Decreased  HENT: Negative.    Eyes: Negative.   Respiratory: Negative.    Cardiovascular: Negative.   Gastrointestinal:  Positive for diarrhea, nausea and vomiting.  Endocrine:       High blood sugars, has novolog but  cannot get her basaglar due to finances  Genitourinary: Negative.   Musculoskeletal:  Positive for gait problem.  Skin:  Positive for wound.       Vac right foot  Allergic/Immunologic: Negative.   Neurological:  Positive for weakness.  Hematological:  Bruises/bleeds easily.       Xarelto  Psychiatric/Behavioral:  Positive for dysphoric mood.   All other systems reviewed and are negative.     Family History  Problem Relation Age of Onset   High blood pressure Mother     Diabetes Mother     Arthritis Mother     Hyperlipidemia Mother     High blood pressure Father     Hyperlipidemia Father     High blood pressure Sister     Diabetes Brother     Hyperlipidemia Brother     High blood pressure Brother      Social History         Socioeconomic History   Marital status: Married      Spouse name: Not on file   Number of children: Not on file   Years of education: Not on file   Highest education level: Not on file  Occupational History   Not on file  Tobacco Use   Smoking status: Never   Smokeless tobacco: Never  Vaping Use  Vaping Use: Never used  Substance and Sexual Activity   Alcohol use: Yes      Alcohol/week: 1.0 standard drink      Types: 1 Glasses of wine per week      Comment: occas   Drug use: No   Sexual activity: Yes      Birth control/protection: None  Other Topics Concern   Not on file  Social History Narrative   Not on file    Social Determinants of Health    Financial Resource Strain: Not on file  Food Insecurity: Not on file  Transportation Needs: Not on file  Physical Activity: Not on file  Stress: Not on file  Social Connections: Not on file         Past Surgical History:  Procedure Laterality Date   APPLICATION OF WOUND VAC Right 04/12/2021    Procedure: APPLICATION OF WOUND VAC;  Surgeon: Erle Crocker, MD;  Location: [facility];  Service: Orthopedics;  Laterality: Right;   I & D EXTREMITY Right 04/06/2021    Procedure: IRRIGATION AND  DEBRIDEMENT TOE WITH WOUND VAC PLACEMENT;  Surgeon: Erle Crocker, MD;  Location: [facility];  Service: Orthopedics;  Laterality: Right;   I & D EXTREMITY Right 04/12/2021    Procedure: IRRIGATION AND DEBRIDEMENT RIGHT FOOT;  Surgeon: Erle Crocker, MD;  Location: [facility];  Service: Orthopedics;  Laterality: Right;   MOUTH SURGERY       ROBOTIC ASSITED PARTIAL NEPHRECTOMY Right 07/28/2019    Procedure: XI ROBOTIC ASSITED PARTIAL NEPHRECTOMY;  Surgeon: Cleon Gustin, MD;  Location: [facility];  Service: Urology;  Laterality: Right;  3 HRS   SECONDARY CLOSURE OF WOUND Right 06/18/2021    Procedure: SECONDARY CLOSURE OF WOUND tRANSMETATARSAL AMPUTAION;  Surgeon: Erle Crocker, MD;  Location: [facility];  Service: Orthopedics;  Laterality: Right;   TRANSMETATARSAL AMPUTATION Right 04/12/2021    Procedure: TRANSMETATARSAL AMPUTATION;  Surgeon: Erle Crocker, MD;  Location: [facility];  Service: Orthopedics;  Laterality: Right;        Past Medical History:  Diagnosis Date   Anemia     Asthma     CHF (congestive heart failure) (HCC)      Pt reports that father has CHF, not her   Diabetes mellitus without complication (HCC)     Dyslipidemia     GERD (gastroesophageal reflux disease)     Pulmonary embolism (HCC)     Stroke (HCC)      BP (!) 179/93   Pulse 66   Temp 97.7 F (36.5 C)   Ht 5\' 2"  (1.575 m)   Wt 242 lb 3.2 oz (109.9 kg)   SpO2 96%   BMI 44.30 kg/m    Opioid Risk Score:   Fall Risk Score:  `1   Depression screen PHQ 2/9   Depression screen PHQ 2/9 06/27/2021  Decreased Interest 2  Down, Depressed, Hopeless 1  PHQ - 2 Score 3  Altered sleeping 2  Tired, decreased energy 3  Change in appetite 3  Feeling bad or failure about yourself  2  Trouble concentrating 1  Moving slowly or fidgety/restless 2  Suicidal thoughts 0  PHQ-9 Score 16  Difficult doing work/chores Very difficult    Objective:   Physical Exam Gen: no distress, normal appearing HEENT: oral  mucosa pink and moist, NCAT Cardio: Reg rate Chest: normal effort, normal rate of breathing Abd: soft, non-distended Ext: no edema Psych: pleasant, normal affect Skin: intact Neuro: Musculoskeletal:  Assessment & Plan:  [name] is a 40 year old woman who presents for follow-up after CIR admission for transmetatarsal amputation  1) Decreased appetite/impaired ability to swallow antibiotic -may benefit from SLP, provided referral -discussed with her the importance of taking her antibiotic as prescribed. Discussed to call surgeon and discuss alternative antibiotics if she cannot swallow current Bactrim  2) Transmetatarsal amputation -denies pain -wound vac in place- may be removed at follow-up tomorrow  3) decreased upper extremity strength/difficulty with transfers -referred to occupational therapy -recommend bone broth to help increase energy and muscle strength- provided resources for grass-fed bone broth -Discussed leaky gut and how it can lead to pain, inflammation, obesity, and chronic disease -Discussed foods that support the microbiome:   1) carrots  2) onions  3) radishes  4) tomatoes  5) turmeric  6) jicama  7) garlic   4) Nausea -continue yogurt which is helping  5) 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 Discussed that this is a capsaicin patch, stronger than capsaicin cream. Discussed that it is currently approved for diabetic peripheral neuropathy and post-herpetic neuralgia, but that it has also shown benefit in treating other forms of neuropathy. Provided patient with link to site to learn more about the patch: [employer]. Discussed that the patch would be placed in office and benefits usually last 3 months. Discussed that unintended exposure to capsaicin can cause severe irritation of eyes, mucous membranes,  respiratory tract, and skin, but that Qutenza is a local treatment and does not have the systemic side effects of other nerve medications. Discussed that there may be pain, itching, erythema, and decreased sensory function associated with the application of Qutenza. Side effects usually subside within 1 week. A cold pack of analgesic medications can help with these side effects. Blood pressure can also be increased due to pain associated with administration of the patch.   >40 minutes spent in discussion of her lack of pain from surgery, plan for follow-up with surgery with potential removal of wound vac, difficulty with swallowing and not taking antibiotic as a results, improvement in symptoms with yogurt, discussed foods that can nourish the microbiome and promote recovery, discussed benefits of continuing yogurt for its live active cultures, discussed use of Gabapentin for diabetic peripheral neuropathy, discussed trial of Qutenza 2 patches of left foot neuropathy

## 2021-06-27 NOTE — Patient Instructions (Signed)
[employer]  -Discussed leaky gut and how it can lead to pain, inflammation, obesity, and chronic disease -Discussed foods that support the microbiome:   1) carrots  2) onions  3) radishes  4) tomatoes  5) turmeric  6) jicama  7) garlic

## 2021-06-28 NOTE — Op Note (Signed)
Sherri Dixon female 39 y.o. 06/18/21  PreOperative Diagnosis: Right surgical wound dehiscence, foot  PostOperative Diagnosis: same  PROCEDURE: Secondary closure of surgical wound dehiscence, complicated Application of negative pressure wound dressing  SURGEON: Melony Overly, MD  ASSISTANT: none  ANESTHESIA: General  FINDINGS: Transmetatarsal wound dehiscence with surrounding necrotic tissue  IMPLANTS: Praveena incisional wound VAC  INDICATIONS:40 y.o. 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  She had some necrotic tissue in the dorsal aspect of the wound with some subsequent dehiscence.  Given the dehiscence and persistent drainage she was indicated for revision and closure and wound VAC placement.   Patient understood the risks, benefits and alternatives to surgery which include but are not limited to wound healing complications, infection, nonunion, malunion, need for further surgery as well as damage to surrounding structures. They also understood the potential for continued pain in that there were no guarantees of acceptable outcome After weighing these risks the patient opted to proceed with surgery.  PROCEDURE: Patient was identified in the preoperative holding area.  The right foot was marked by myself.  Consent was signed by myself and the patient.  Block was performed by anesthesia in the preoperative holding area.  Patient was taken to the operative suite and placed supine on the operative table.  General LMA anesthesia was induced without difficulty. Bump was placed under the operative hip and bone foam was used.  All bony prominences were well padded.  Tourniquet was placed on the operative thigh.  Preoperative antibiotics were given. The extremity was prepped and draped in the usual sterile fashion and surgical timeout was performed.    Began by inspecting the wound.  There is dehiscence and dry gangrenous  type tissue on the dorsal aspect of the foot.  The tourniquet was not inflated.  We proceeded with debridement of the skin edges back to healthy viable skin.  There was a cavity dorsally but no gross infection.  There was some fibrinous tissue noted.  Complete debridement of the wound was undertaken as noted below.  Debridement type: Excisional Debridement  Side: right  Body Location: foot   Tools used for debridement: scalpel, scissors, curette, and rongeur  Pre-debridement Wound size (cm):   Length: 6        Width: 3     Depth: 4   Post-debridement Wound size (cm):   Length: 7        Width: 4     Depth: 4   Debridement depth beyond dead/damaged tissue down to healthy viable tissue: yes  Tissue layer involved: skin, subcutaneous tissue, muscle / fascia  Nature of tissue removed:  Necrotic, slough, devitalized  Irrigation volume: 3000cc     Irrigation fluid type: Normal Saline   Deep tissue was sent for culture.  After debridement of the skin edges complex closure of the wound dehiscence was performed.  This was done using deep suture and 2-0 nylon suture for the skin.  We were able to close the wound completely.  This was done in a layered fashion.  After closure of the wound wound VAC was placed.  A previous incisional wound VAC was placed.  There was good suction after placement of the wound VAC.  Soft dressing was placed over the vac.  POST OPERATIVE INSTRUCTIONS: Heel weightbearing Keep dressing in place Follow-up in 1 week for VAC removal   TOURNIQUET TIME:no tourniquet  BLOOD LOSS:  Minimal         DRAINS: none  SPECIMEN: none       COMPLICATIONS:  * No complications entered in OR log *         Disposition: PACU - hemodynamically stable.         Condition: stable

## 2021-07-01 ENCOUNTER — Other Ambulatory Visit: Payer: Self-pay

## 2021-07-01 ENCOUNTER — Encounter: Payer: 59 | Attending: Physical Medicine and Rehabilitation | Admitting: Physical Medicine and Rehabilitation

## 2021-07-01 ENCOUNTER — Telehealth: Payer: Self-pay

## 2021-07-01 DIAGNOSIS — Z89431 Acquired absence of right foot: Secondary | ICD-10-CM | POA: Insufficient documentation

## 2021-07-01 DIAGNOSIS — R1310 Dysphagia, unspecified: Secondary | ICD-10-CM | POA: Diagnosis not present

## 2021-07-01 NOTE — Progress Notes (Signed)
Subjective:    Patient ID: Sherri Dixon, female    DOB: [birth year], 40 y.o.   MRN: [id number]  HPI An audio/video tele-health visit is felt to be the most appropriate encounter for this patient at this time.  This is a follow up tele-visit via phone. The patient is at home. MD is at office.    Sherri Dixon is a 40 year old woman who presents for hospital follow-up after CIR admission for right transmetatarsal amputation.   -Sherri Dixon has not been having an appetite.  -She has been having diarrhea.  -She asks whether her antibiotic can be changed as the pill is big for her to swallow and she has thrown it up- she says she has not been taking it. I discussed with pharmacy and we are able to switch her medication to liquid Bactrim -Last week when I called her she was in the hospital for secondary closure of her wound -She has a follow-up with the surgeon tomorrow- she thinks wound vac will be removed tomorrow.  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  -she tries not to take the Gabapentin much because it helps her sleepy. -She has felt so much better Activia ans sea moss   Pain Inventory Average Pain 0 Pain Right Now 0 My pain is no pain  In the last 24 hours, has pain interfered with the following? General activity 0 Relation with others 0 Enjoyment of life 0 What TIME of day is your pain at its worst? no pain Sleep (in general) Poor  Pain is worse with: no pain Pain improves with: no pain Relief from Meds: 5  walk with assistance use a wheelchair needs help with transfers  not employed: date last employed . I need assistance with the following:  dressing, bathing, toileting, meal prep, household duties, and shopping  weakness trouble walking  Any changes since last visit?  yes  Has been back in the hospital since d/c from inpt rehab (surgery)  Any changes since last visit?  no   Review of Systems   Constitutional:  Positive for appetite change.       Decreased  HENT: Negative.    Eyes: Negative.   Respiratory: Negative.    Cardiovascular: Negative.   Gastrointestinal:  Positive for diarrhea, nausea and vomiting.  Endocrine:       High blood sugars, has novolog but cannot get her basaglar due to finances  Genitourinary: Negative.   Musculoskeletal:  Positive for gait problem.  Skin:  Positive for wound.       Vac right foot  Allergic/Immunologic: Negative.   Neurological:  Positive for weakness.  Hematological:  Bruises/bleeds easily.       Xarelto  Psychiatric/Behavioral:  Positive for dysphoric mood.   All other systems reviewed and are negative.     Family History  Problem Relation Age of Onset   High blood pressure Mother     Diabetes Mother     Arthritis Mother     Hyperlipidemia Mother     High blood pressure Father     Hyperlipidemia Father     High blood pressure Sister     Diabetes Brother     Hyperlipidemia Brother     High blood pressure Brother      Social History         Socioeconomic History   Marital status: Married      Spouse name:  Not on file   Number of children: Not on file   Years of education: Not on file   Highest education level: Not on file  Occupational History   Not on file  Tobacco Use   Smoking status: Never   Smokeless tobacco: Never  Vaping Use   Vaping Use: Never used  Substance and Sexual Activity   Alcohol use: Yes      Alcohol/week: 1.0 standard drink      Types: 1 Glasses of wine per week      Comment: occas   Drug use: No   Sexual activity: Yes      Birth control/protection: None  Other Topics Concern   Not on file  Social History Narrative   Not on file    Social Determinants of Health    Financial Resource Strain: Not on file  Food Insecurity: Not on file  Transportation Needs: Not on file  Physical Activity: Not on file  Stress: Not on file  Social Connections: Not on file         Past Surgical  History:  Procedure Laterality Date   APPLICATION OF WOUND VAC Right 04/12/2021    Procedure: APPLICATION OF WOUND VAC;  Surgeon: Erle Crocker, MD;  Location: [facility];  Service: Orthopedics;  Laterality: Right;   I & D EXTREMITY Right 04/06/2021    Procedure: IRRIGATION AND DEBRIDEMENT TOE WITH WOUND VAC PLACEMENT;  Surgeon: Erle Crocker, MD;  Location: [facility];  Service: Orthopedics;  Laterality: Right;   I & D EXTREMITY Right 04/12/2021    Procedure: IRRIGATION AND DEBRIDEMENT RIGHT FOOT;  Surgeon: Erle Crocker, MD;  Location: [facility];  Service: Orthopedics;  Laterality: Right;   MOUTH SURGERY       ROBOTIC ASSITED PARTIAL NEPHRECTOMY Right 07/28/2019    Procedure: XI ROBOTIC ASSITED PARTIAL NEPHRECTOMY;  Surgeon: Cleon Gustin, MD;  Location: [facility];  Service: Urology;  Laterality: Right;  3 HRS   SECONDARY CLOSURE OF WOUND Right 06/18/2021    Procedure: SECONDARY CLOSURE OF WOUND tRANSMETATARSAL AMPUTAION;  Surgeon: Erle Crocker, MD;  Location: [facility];  Service: Orthopedics;  Laterality: Right;   TRANSMETATARSAL AMPUTATION Right 04/12/2021    Procedure: TRANSMETATARSAL AMPUTATION;  Surgeon: Erle Crocker, MD;  Location: [facility];  Service: Orthopedics;  Laterality: Right;        Past Medical History:  Diagnosis Date   Anemia     Asthma     CHF (congestive heart failure) (HCC)      Pt reports that father has CHF, not her   Diabetes mellitus without complication (HCC)     Dyslipidemia     GERD (gastroesophageal reflux disease)     Pulmonary embolism (HCC)     Stroke (HCC)      BP (!) 179/93   Pulse 66   Temp 97.7 F (36.5 C)   Ht 5\' 2"  (1.575 m)   Wt 242 lb 3.2 oz (109.9 kg)   SpO2 96%   BMI 44.30 kg/m    Opioid Risk Score:   Fall Risk Score:  `1   Depression screen PHQ 2/9   Depression screen PHQ 2/9 06/27/2021  Decreased Interest 2  Down, Depressed, Hopeless 1  PHQ - 2 Score 3  Altered sleeping 2  Tired, decreased energy 3  Change in  appetite 3  Feeling bad or failure about yourself  2  Trouble concentrating 1  Moving slowly or fidgety/restless 2  Suicidal thoughts 0  PHQ-9  Score 16  Difficult doing work/chores Very difficult    Objective:   Physical Exam Not performed as patient was seen via phone       Assessment & Plan:  Sherri Dixon is a 40 year old woman who presents for f/u after CIR admission for transmetatarsal amputation  1) Decreased appetite/impaired ability to swallow antibiotic -may benefit from SLP, provided referral -discussed with her the importance of taking her antibiotic as prescribed. Discussed to call surgeon and discuss alternative antibiotics if she cannot swallow current Bactrim  2) Transmetatarsal amputation -denies pain -wound vac in place- may be removed at follow-up tomorrow -patient has not been taking antibiotic as has not been able to tolerate swallowing it. Discussed with our inpatient pharmacy who helped review patient's chart and confirmed that we can switch to liquid Bactrim  3) decreased upper extremity strength/difficulty with transfers -referred to occupational therapy -recommend bone broth to help increase energy and muscle strength- provided resources for grass-fed bone broth -Discussed leaky gut and how it can lead to pain, inflammation, obesity, and chronic disease -Discussed foods that support the microbiome:   1) carrots  2) onions  3) radishes  4) tomatoes  5) turmeric  6) jicama  7) garlic   4) Nausea -continue yogurt which is helping  5) 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 Discussed that this is a capsaicin patch, stronger than capsaicin cream. Discussed that it is currently approved for diabetic peripheral neuropathy and post-herpetic neuralgia, but that it has also shown benefit in treating other forms of neuropathy.  Provided patient with link to site to learn more about the patch: [employer]. Discussed that the patch would be placed in office and benefits usually last 3 months. Discussed that unintended exposure to capsaicin can cause severe irritation of eyes, mucous membranes, respiratory tract, and skin, but that Qutenza is a local treatment and does not have the systemic side effects of other nerve medications. Discussed that there may be pain, itching, erythema, and decreased sensory function associated with the application of Qutenza. Side effects usually subside within 1 week. A cold pack of analgesic medications can help with these side effects. Blood pressure can also be increased due to pain associated with administration of the patch.   5 minutes spent in discussion with patient regarding her difficulty in swallowing her antibiotic due to the size of her pill, confirming that her antibiotic is Bactrim. Additional time spent in discussion with our inpatient pharmacist regarding suitable alternative, calling pharmacy to request change to liquid formulation

## 2021-07-01 NOTE — Telephone Encounter (Signed)
Sherri Dixon spoke with her Ortho MD about the antibiotic discussed at the last office visit. She would like to speak with you further. Please call her back on [phone].  Thank you

## 2021-07-02 MED ORDER — SULFAMETHOXAZOLE-TRIMETHOPRIM 200-40 MG/5ML PO SUSP: 10.0000 mL | Freq: Two times a day (BID) | ORAL | 0 refills | Status: DC

## 2021-07-02 NOTE — Telephone Encounter (Signed)
[facility] sent a fax request for liquid Bactrim. Per pharmacy, Patient stated you were sending the request.   Please advise or e-prescribe. Thank you

## 2021-07-02 NOTE — Addendum Note (Signed)
Addended by: Izora Ribas on: 07/02/2021 11:22 AM   Modules accepted: Orders

## 2021-07-15 ENCOUNTER — Ambulatory Visit: Payer: 59 | Admitting: Occupational Therapy

## 2021-07-30 ENCOUNTER — Ambulatory Visit: Payer: 59 | Attending: Physical Medicine and Rehabilitation | Admitting: Occupational Therapy

## 2021-10-03 ENCOUNTER — Encounter: Payer: Self-pay | Admitting: Gastroenterology

## 2021-10-07 ENCOUNTER — Encounter: Payer: 59 | Admitting: Physical Medicine and Rehabilitation

## 2021-10-08 ENCOUNTER — Encounter: Payer: Self-pay | Admitting: Gastroenterology

## 2021-10-08 ENCOUNTER — Other Ambulatory Visit (INDEPENDENT_AMBULATORY_CARE_PROVIDER_SITE_OTHER): Payer: 59

## 2021-10-08 ENCOUNTER — Ambulatory Visit (INDEPENDENT_AMBULATORY_CARE_PROVIDER_SITE_OTHER): Payer: 59 | Admitting: Gastroenterology

## 2021-10-08 VITALS — BP 176/100 | HR 72 | Ht 62.0 in | Wt 232.4 lb

## 2021-10-08 DIAGNOSIS — E8809 Other disorders of plasma-protein metabolism, not elsewhere classified: Secondary | ICD-10-CM

## 2021-10-08 DIAGNOSIS — R634 Abnormal weight loss: Secondary | ICD-10-CM

## 2021-10-08 DIAGNOSIS — R112 Nausea with vomiting, unspecified: Secondary | ICD-10-CM | POA: Diagnosis not present

## 2021-10-08 DIAGNOSIS — R7989 Other specified abnormal findings of blood chemistry: Secondary | ICD-10-CM | POA: Diagnosis not present

## 2021-10-08 DIAGNOSIS — R601 Generalized edema: Secondary | ICD-10-CM

## 2021-10-08 DIAGNOSIS — R748 Abnormal levels of other serum enzymes: Secondary | ICD-10-CM

## 2021-10-08 DIAGNOSIS — K5904 Chronic idiopathic constipation: Secondary | ICD-10-CM

## 2021-10-08 LAB — CBC WITH DIFFERENTIAL/PLATELET
Basophils Absolute: 0 10*3/uL (ref 0.0–0.1)
Basophils Relative: 0.6 % (ref 0.0–3.0)
Eosinophils Absolute: 0.2 10*3/uL (ref 0.0–0.7)
Eosinophils Relative: 2.2 % (ref 0.0–5.0)
HCT: 34 % — ABNORMAL LOW (ref 36.0–46.0)
Hemoglobin: 11.1 g/dL — ABNORMAL LOW (ref 12.0–15.0)
Lymphocytes Relative: 24.2 % (ref 12.0–46.0)
Lymphs Abs: 1.9 10*3/uL (ref 0.7–4.0)
MCHC: 32.8 g/dL (ref 30.0–36.0)
MCV: 90.7 fl (ref 78.0–100.0)
Monocytes Absolute: 0.4 10*3/uL (ref 0.1–1.0)
Monocytes Relative: 4.7 % (ref 3.0–12.0)
Neutro Abs: 5.2 10*3/uL (ref 1.4–7.7)
Neutrophils Relative %: 68.3 % (ref 43.0–77.0)
Platelets: 313 10*3/uL (ref 150.0–400.0)
RBC: 3.75 Mil/uL — ABNORMAL LOW (ref 3.87–5.11)
RDW: 15.6 % — ABNORMAL HIGH (ref 11.5–15.5)
WBC: 7.7 10*3/uL (ref 4.0–10.5)

## 2021-10-08 LAB — COMPREHENSIVE METABOLIC PANEL
ALT: 11 U/L (ref 0–35)
AST: 17 U/L (ref 0–37)
Albumin: 2.2 g/dL — ABNORMAL LOW (ref 3.5–5.2)
Alkaline Phosphatase: 121 U/L — ABNORMAL HIGH (ref 39–117)
BUN: 9 mg/dL (ref 6–23)
CO2: 28 mEq/L (ref 19–32)
Calcium: 8.3 mg/dL — ABNORMAL LOW (ref 8.4–10.5)
Chloride: 104 mEq/L (ref 96–112)
Creatinine, Ser: 1.07 mg/dL (ref 0.40–1.20)
GFR: 64.03 mL/min (ref >60.00)
Glucose, Bld: 166 mg/dL — ABNORMAL HIGH (ref 70–99)
Potassium: 3.3 mEq/L — ABNORMAL LOW (ref 3.5–5.1)
Sodium: 138 mEq/L (ref 135–145)
Total Bilirubin: 0.2 mg/dL (ref 0.2–1.2)
Total Protein: 5.6 g/dL — ABNORMAL LOW (ref 6.0–8.3)

## 2021-10-08 LAB — AMYLASE: Amylase: 24 U/L — ABNORMAL LOW (ref 27–131)

## 2021-10-08 LAB — B12 AND FOLATE PANEL
Folate: 13.9 ng/mL (ref >5.9)
Vitamin B-12: 400 pg/mL (ref 211–911)

## 2021-10-08 LAB — LIPASE: Lipase: 11 U/L (ref 11.0–59.0)

## 2021-10-08 NOTE — Progress Notes (Signed)
Sherri Dixon    [id number]    25-Sep-1981  Primary Care Physician:Falstreau, Blaine Hamper, NP  Referring Physician: Tomasa Hose, NP [address] [place],  [place] [id number]   Chief complaint:  Abnormal LFT, Weight loss  HPI:  41 yr old very pleasant feamle is here for new patient visit for evaluation of elevated alk phos, and GGT. Patient complains of intermittent nausea and vomiting, she has appetite but she feels full after small meals, has been controlled reflux.  She has lost over 70 pounds in the past year unintentionally. She has history of diabetes, is on insulin but has not been checking her blood sugars.  Because she misplaced her machine and also it is cost prohibitive for her to do the strips to check blood glucose.  Her serum albumin is less than 1, she has chronic kidney disease  Hepatic Function Latest Ref Rng & Units 04/22/2021 04/18/2021 04/05/2021  Total Protein 6.5 - 8.1 g/dL 5.5(L) 5.7(L) 5.9(L)  Albumin 3.5 - 5.0 g/dL <1.0(L) <1.0(L) <1.0(L)  AST 15 - 41 U/L _0 ALT 0 - 44 U/L _1 Alk Phosphatase 38 - 126 U/L 141(H) 174(H) 266(H)  Total Bilirubin 0.3 - 1.2 mg/dL 0.6 0.5 0.4   She has endocrinologist follow-up appointment in March and also has follow-up appointment with her nephrologist end of this month, both are at [facility].  She never had EGD or colonoscopy.  She has generalized abdominal discomfort.  Complains of constipation, on average has bowel movement once or twice a week.  No melena or rectal bleeding.   Outpatient Encounter Medications as of 10/08/2021  Medication Sig   acetaminophen (TYLENOL) 325 MG tablet Take 1-2 tablets (325-650 mg total) by mouth every 4 (four) hours as needed for mild pain.   Alcohol Swabs 70 % PADS 1 application by Does not apply route 3 (three) times daily before meals.   dapagliflozin propanediol (FARXIGA) 10 MG TABS tablet Take 10 mg by mouth daily.   docusate sodium (COLACE) 100 MG  capsule Take 2 capsules (200 mg total) by mouth daily.   furosemide (LASIX) 20 MG tablet Take 1 tablet (20 mg total) by mouth 2 (two) times daily.   gabapentin (NEURONTIN) 300 MG capsule Take 1 capsule (300 mg total) by mouth daily as needed (pain).   glucose blood test strip Use as instructed   hydrALAZINE (APRESOLINE) 50 MG tablet Take 1 tablet (50 mg total) by mouth 3 (three) times daily.   insulin aspart (NOVOLOG FLEXPEN) 100 UNIT/ML FlexPen Inject 0-20 Units as directed 3 (three) times daily. Sliding scale   Insulin Syringe-Needle U-100 (INSULIN SYRINGE 1CC/28G) 28G X 1/2" 1 ML MISC 1 application by Does not apply route 3 (three) times daily before meals.   magnesium oxide (MAG-OX) 400 MG tablet Take 1 tablet (400 mg total) by mouth daily.   metoprolol succinate (TOPROL-XL) 100 MG 24 hr tablet Take 1 tablet (100 mg total) by mouth daily.   mineral oil-hydrophilic petrolatum (AQUAPHOR) ointment Apply 1 application topically as needed (eczema).   nutrition supplement, JUVEN, (JUVEN) PACK Take 1 packet by mouth 2 (two) times daily between meals.   olmesartan (BENICAR) 40 MG tablet Take 1 tablet (40 mg total) by mouth daily.   ondansetron (ZOFRAN) 4 MG tablet Take 1 tablet (4 mg total) by mouth daily as needed for nausea or vomiting.   OVER THE COUNTER MEDICATION Take 1 tablet  by mouth daily. [place] vitamin   pantoprazole (PROTONIX) 40 MG tablet Take 1 tablet (40 mg total) by mouth daily at 6 PM.   polyvinyl alcohol (LIQUIFILM TEARS) 1.4 % ophthalmic solution Place 1 drop into both eyes as needed for dry eyes.   potassium chloride (KLOR-CON) 10 MEQ tablet Take 3 tablets (30 mEq total) by mouth 2 (two) times daily.   rosuvastatin (CRESTOR) 40 MG tablet Take 1 tablet (40 mg total) by mouth daily.   sulfamethoxazole-trimethoprim (BACTRIM) 200-40 MG/5ML suspension Take 10 mLs by mouth 2 (two) times daily.   Vitamin D, Ergocalciferol, (DRISDOL) 1.25 MG (50000 UNIT) CAPS capsule Take 50,000 Units by  mouth once a week.   XARELTO 20 MG TABS tablet Take 20 mg by mouth daily.   Zinc Sulfate 220 (50 Zn) MG TABS Take 1 tablet (220 mg total) by mouth daily. (Patient not taking: Reported on 06/27/2021)   No facility-administered encounter medications on file as of 10/08/2021.    Allergies as of 10/08/2021 - Review Complete 06/27/2021  Allergen Reaction Noted   Amlodipine Swelling 03/26/2020   Humalog kwikpen [insulin lispro] Other (See Comments) 06/05/2016    Past Medical History:  Diagnosis Date   Anemia    Asthma    CHF (congestive heart failure) (HCC)    Pt reports that father has CHF, not her   Diabetes mellitus without complication ([place])    Dyslipidemia    GERD (gastroesophageal reflux disease)    Pulmonary embolism ([place])    Stroke [facility])     Past Surgical History:  Procedure Laterality Date   APPLICATION OF WOUND VAC Right 04/12/2021   Procedure: APPLICATION OF WOUND VAC;  Surgeon: Erle Crocker, MD;  Location: [facility];  Service: Orthopedics;  Laterality: Right;   I & D EXTREMITY Right 04/06/2021   Procedure: IRRIGATION AND DEBRIDEMENT TOE WITH WOUND VAC PLACEMENT;  Surgeon: Erle Crocker, MD;  Location: [place];  Service: Orthopedics;  Laterality: Right;   I & D EXTREMITY Right 04/12/2021   Procedure: IRRIGATION AND DEBRIDEMENT RIGHT FOOT;  Surgeon: Erle Crocker, MD;  Location: [place] [facility];  Service: Orthopedics;  Laterality: Right;   MOUTH SURGERY     ROBOTIC ASSITED PARTIAL NEPHRECTOMY Right 07/28/2019   Procedure: XI ROBOTIC ASSITED PARTIAL NEPHRECTOMY;  Surgeon: Cleon Gustin, MD;  Location: [facility];  Service: Urology;  Laterality: Right;  3 HRS   SECONDARY CLOSURE OF WOUND Right 06/18/2021   Procedure: SECONDARY CLOSURE OF WOUND tRANSMETATARSAL AMPUTAION;  Surgeon: Erle Crocker, MD;  Location: [facility];  Service: Orthopedics;  Laterality: Right;   TRANSMETATARSAL AMPUTATION Right 04/12/2021   Procedure: TRANSMETATARSAL AMPUTATION;  Surgeon: Erle Crocker, MD;  Location: [place];  Service: Orthopedics;  Laterality: Right;    Family History  Problem Relation Age of Onset   High blood pressure Mother    Diabetes Mother    Arthritis Mother    Hyperlipidemia Mother    High blood pressure Father    Hyperlipidemia Father    High blood pressure Sister    Diabetes Brother    Hyperlipidemia Brother    High blood pressure Brother     Social History   Socioeconomic History   Marital status: Married    Spouse name: Not on file   Number of children: Not on file   Years of education: Not on file   Highest education level: Not on file  Occupational History   Not on file  Tobacco Use  Smoking status: Never   Smokeless tobacco: Never  Vaping Use   Vaping Use: Never used  Substance and Sexual Activity   Alcohol use: Yes    Alcohol/week: 1.0 standard drink    Types: 1 Glasses of wine per week    Comment: occas   Drug use: No   Sexual activity: Yes    Birth control/protection: None  Other Topics Concern   Not on file  Social History Narrative   Not on file   Social Determinants of Health   Financial Resource Strain: Not on file  Food Insecurity: Not on file  Transportation Needs: Not on file  Physical Activity: Not on file  Stress: Not on file  Social Connections: Not on file  Intimate Partner Violence: Not on file      Review of systems: All other review of systems negative except as mentioned in the HPI.   Physical Exam: Vitals:   10/08/21 1034  BP: (!) 176/100  Pulse: 72   Body mass index is 42.51 kg/m. Gen:      No acute distress, in wheelchair, anasarca HEENT:  sclera anicteric Abd:      soft, non-tender; no palpable masses, no distension Ext:    Bilateral edema Neuro: alert and oriented x 3 Psych: normal mood and affect  Data Reviewed:  Reviewed labs, radiology imaging, old records and pertinent past GI work up   Assessment and Plan/Recommendations:  41 year old female with type 2  diabetes, insulin-dependent with severe hypoalbuminemia and anasarca, Elevated alk phos and GGT could be secondary to hepatic congestion/bile stasis Will need to exclude other etiology for chronic liver disease Check hepatitis panel, ANA, antimitochondrial antibody, anti-smooth muscle antibody, ceruloplasmin Obtain abdominal ultrasound to further evaluate the liver and exclude any acute intra-abdominal pathology  Advised patient to follow-up with nephrology to exclude proteinuria exacerbating severe hypoalbuminemia.  She has severe anasarca  Nausea and vomiting secondary to frequent fluctuation of blood sugars and poorly controlled diabetes She will need better diabetes control, avoid frequent fluctuation of blood sugar Small frequent meals with high-protein diet Use Zofran 4 mg daily as needed for severe nausea and vomiting  GERD: switch to lansoprazole 30 mg twice daily Antireflux measures  Chronic idiopathic constipation: Use Linzess 290 mcg daily Increase water intake  Return in 6 to 8 weeks   The patient was provided an opportunity to ask questions and all were answered. The patient agreed with the plan and demonstrated an understanding of the instructions.  Damaris Hippo , MD    CC: Tomasa Hose, NP

## 2021-10-08 NOTE — Patient Instructions (Addendum)
You have been scheduled for an abdominal ultrasound at [facility] (1st floor of hospital) on 10/15/2021 at 10:30am. Please arrive 15 minutes prior to your appointment for registration. Make certain not to have anything to eat or drink 6 hours prior to your appointment. Should you need to reschedule your appointment, please contact radiology at [phone]. This test typically takes about 30 minutes to perform.   Your provider has requested that you go to the basement level for lab work before leaving today. Press "B" on the elevator. The lab is located at the first door on the left as you exit the elevator.   STOP Protonix   We will send Lansoprazole to your pharmacy  Linzess 290 mcg samples given  Follow up with your endocrinologist, nephrologist, and nutrentist      Do a high protein diet, and small frequent meals   Gastroesophageal Reflux Disease, Adult Gastroesophageal reflux (GER) happens when acid from the stomach flows up into the tube that connects the mouth and the stomach (esophagus). Normally, food travels down the esophagus and stays in the stomach to be digested. However, when a person has GER, food and stomach acid sometimes move back up into the esophagus. If this becomes a more serious problem, the person may be diagnosed with a disease called gastroesophageal reflux disease (GERD). GERD occurs when the reflux: Happens often. Causes frequent or severe symptoms. Causes problems such as damage to the esophagus. When stomach acid comes in contact with the esophagus, the acid may cause inflammation in the esophagus. Over time, GERD may create small holes (ulcers) in the lining of the esophagus. What are the causes? This condition is caused by a problem with the muscle between the esophagus and the stomach (lower esophageal sphincter, or LES). Normally, the LES muscle closes after food passes through the esophagus to the stomach. When the LES is weakened or abnormal, it  does not close properly, and that allows food and stomach acid to go back up into the esophagus. The LES can be weakened by certain dietary substances, medicines, and medical conditions, including: Tobacco use. Pregnancy. Having a hiatal hernia. Alcohol use. Certain foods and beverages, such as coffee, chocolate, onions, and peppermint. What increases the risk? You are more likely to develop this condition if you: Have an increased body weight. Have a connective tissue disorder. Take NSAIDs, such as ibuprofen. What are the signs or symptoms? Symptoms of this condition include: Heartburn. Difficult or painful swallowing and the feeling of having a lump in the throat. A bitter taste in the mouth. Bad breath and having a large amount of saliva. Having an upset or bloated stomach and belching. Chest pain. Different conditions can cause chest pain. Make sure you see your health care provider if you experience chest pain. Shortness of breath or wheezing. Ongoing (chronic) cough or a nighttime cough. Wearing away of tooth enamel. Weight loss. How is this diagnosed? This condition may be diagnosed based on a medical history and a physical exam. To determine if you have mild or severe GERD, your health care provider may also monitor how you respond to treatment. You may also have tests, including: A test to examine your stomach and esophagus with a small camera (endoscopy). A test that measures the acidity level in your esophagus. A test that measures how much pressure is on your esophagus. A barium swallow or modified barium swallow test to show the shape, size, and functioning of your esophagus. How is this treated? Treatment for  this condition may vary depending on how severe your symptoms are. Your health care provider may recommend: Changes to your diet. Medicine. Surgery. The goal of treatment is to help relieve your symptoms and to prevent complications. Follow these instructions at  home: Eating and drinking  Follow a diet as recommended by your health care provider. This may involve avoiding foods and drinks such as: Coffee and tea, with or without caffeine. Drinks that contain alcohol. Energy drinks and sports drinks. Carbonated drinks or sodas. Chocolate and cocoa. Peppermint and mint flavorings. Garlic and onions. Horseradish. Spicy and acidic foods, including peppers, chili powder, curry powder, vinegar, hot sauces, and barbecue sauce. Citrus fruit juices and citrus fruits, such as oranges, lemons, and limes. Tomato-based foods, such as red sauce, chili, salsa, and pizza with red sauce. Fried and fatty foods, such as donuts, french fries, potato chips, and high-fat dressings. High-fat meats, such as hot dogs and fatty cuts of red and white meats, such as rib eye steak, sausage, ham, and bacon. High-fat dairy items, such as whole milk, butter, and cream cheese. Eat small, frequent meals instead of large meals. Avoid drinking large amounts of liquid with your meals. Avoid eating meals during the 2-3 hours before bedtime. Avoid lying down right after you eat. Do not exercise right after you eat. Lifestyle  Do not use any products that contain nicotine or tobacco. These products include cigarettes, chewing tobacco, and vaping devices, such as e-cigarettes. If you need help quitting, ask your health care provider. Try to reduce your stress by using methods such as yoga or meditation. If you need help reducing stress, ask your health care provider. If you are overweight, reduce your weight to an amount that is healthy for you. Ask your health care provider for guidance about a safe weight loss goal. General instructions Pay attention to any changes in your symptoms. Take over-the-counter and prescription medicines only as told by your health care provider. Do not take aspirin, ibuprofen, or other NSAIDs unless your health care provider told you to take these  medicines. Wear loose-fitting clothing. Do not wear anything tight around your waist that causes pressure on your abdomen. Raise (elevate) the head of your bed about 6 inches (15 cm). You can use a wedge to do this. Avoid bending over if this makes your symptoms worse. Keep all follow-up visits. This is important. Contact a health care provider if: You have: New symptoms. Unexplained weight loss. Difficulty swallowing or it hurts to swallow. Wheezing or a persistent cough. A hoarse voice. Your symptoms do not improve with treatment. Get help right away if: You have sudden pain in your arms, neck, jaw, teeth, or back. You suddenly feel sweaty, dizzy, or light-headed. You have chest pain or shortness of breath. You vomit and the vomit is green, yellow, or black, or it looks like blood or coffee grounds. You faint. You have stool that is red, bloody, or black. You cannot swallow, drink, or eat. These symptoms may represent a serious problem that is an emergency. Do not wait to see if the symptoms will go away. Get medical help right away. Call your local emergency services (911 in the [place] Do not drive yourself to the hospital. Summary Gastroesophageal reflux happens when acid from the stomach flows up into the esophagus. GERD is a disease in which the reflux happens often, causes frequent or severe symptoms, or causes problems such as damage to the esophagus. Treatment for this condition may vary depending on how  severe your symptoms are. Your health care provider may recommend diet and lifestyle changes, medicine, or surgery. Contact a health care provider if you have new or worsening symptoms. Take over-the-counter and prescription medicines only as told by your health care provider. Do not take aspirin, ibuprofen, or other NSAIDs unless your health care provider told you to do so. Keep all follow-up visits as told by your health care provider. This is important. This information is not  intended to replace advice given to you by your health care provider. Make sure you discuss any questions you have with your health care provider. Document Revised: 03/26/2020 Document Reviewed: 03/26/2020 Elsevier Patient Education  2022 [employer].   Due to recent changes in healthcare laws, you may see the results of your imaging and laboratory studies on MyChart before your provider has had a chance to review them.  We understand that in some cases there may be results that are confusing or concerning to you. Not all laboratory results come back in the same time frame and the provider may be waiting for multiple results in order to interpret others.  Please give [place] 48 hours in order for your provider to thoroughly review all the results before contacting the office for clarification of your results.    I appreciate the  opportunity to care for you  Thank You   Harl Bowie , MD

## 2021-10-09 ENCOUNTER — Encounter: Payer: Self-pay | Admitting: Gastroenterology

## 2021-10-11 LAB — MITOCHONDRIAL ANTIBODIES: Mitochondrial M2 Ab, IgG: <=20 U (ref <=20.0)

## 2021-10-11 LAB — ALPHA-1-ANTITRYPSIN: A-1 Antitrypsin, Ser: 131 mg/dL (ref 83–199)

## 2021-10-11 LAB — HEPATITIS C ANTIBODY
Hepatitis C Ab: NONREACTIVE
SIGNAL TO CUT-OFF: 0.02 (ref <1.00)

## 2021-10-11 LAB — ANA: Anti Nuclear Antibody (ANA): NEGATIVE

## 2021-10-11 LAB — ANTI-SMOOTH MUSCLE ANTIBODY, IGG: Actin (Smooth Muscle) Antibody (IGG): <20 U (ref <20)

## 2021-10-11 LAB — HEPATITIS B SURFACE ANTIBODY,QUALITATIVE: Hep B S Ab: NONREACTIVE

## 2021-10-11 LAB — HEPATITIS B SURFACE ANTIGEN: Hepatitis B Surface Ag: NONREACTIVE

## 2021-10-11 LAB — HEPATITIS A ANTIBODY, TOTAL: Hepatitis A AB,Total: NONREACTIVE

## 2021-10-11 LAB — CERULOPLASMIN: Ceruloplasmin: 18 mg/dL (ref 18–53)

## 2021-10-13 LAB — ALKALINE PHOSPHATASE, ISOENZYMES
Alkaline Phosphatase: 145 IU/L — ABNORMAL HIGH (ref 44–121)
BONE FRACTION: 42 % (ref 14–68)
INTESTINAL FRAC.: 2 % (ref 0–18)
LIVER FRACTION: 56 % (ref 18–85)

## 2021-10-15 ENCOUNTER — Other Ambulatory Visit: Payer: Self-pay

## 2021-10-15 ENCOUNTER — Ambulatory Visit (HOSPITAL_COMMUNITY)
Admission: RE | Admit: 2021-10-15 | Discharge: 2021-10-15 | Disposition: A | Discharge status: Home / Self Care | Payer: 59 | Source: Ambulatory Visit | Attending: Gastroenterology | Admitting: Gastroenterology

## 2021-10-15 DIAGNOSIS — K5904 Chronic idiopathic constipation: Secondary | ICD-10-CM | POA: Insufficient documentation

## 2021-10-15 DIAGNOSIS — R634 Abnormal weight loss: Secondary | ICD-10-CM | POA: Diagnosis present

## 2021-10-15 DIAGNOSIS — R112 Nausea with vomiting, unspecified: Secondary | ICD-10-CM | POA: Diagnosis present

## 2021-10-15 DIAGNOSIS — R748 Abnormal levels of other serum enzymes: Secondary | ICD-10-CM | POA: Insufficient documentation

## 2021-10-15 DIAGNOSIS — R7989 Other specified abnormal findings of blood chemistry: Secondary | ICD-10-CM | POA: Insufficient documentation

## 2021-10-15 IMAGING — US US ABDOMEN COMPLETE
1 series · 13 of 25 positions shown · non-contrast
Comparison: None.

CLINICAL DATA: weight loss nausea and vomiting

EXAM:
ABDOMEN ULTRASOUND COMPLETE

[Series 1: us abdomen complete · 13 of 155 slices shown]
[im 1/155]
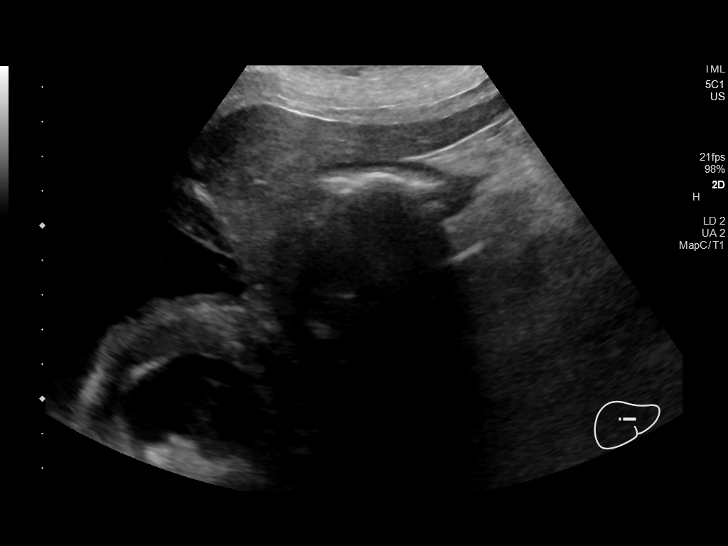
[im 13/155]
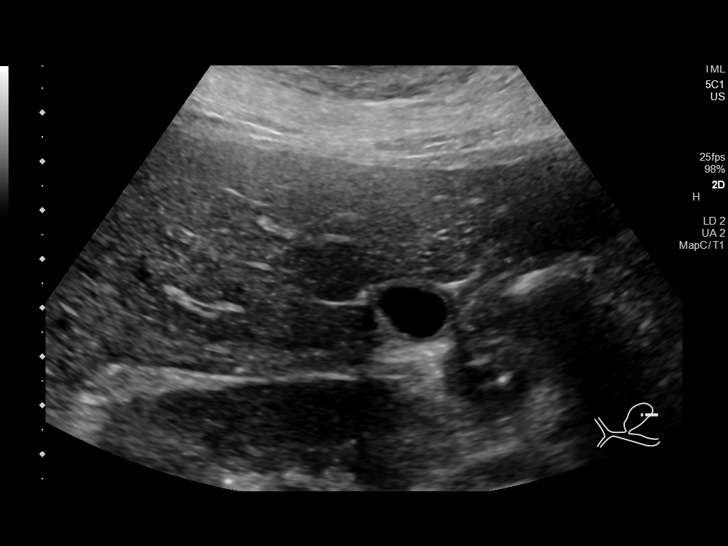
[im 26/155]
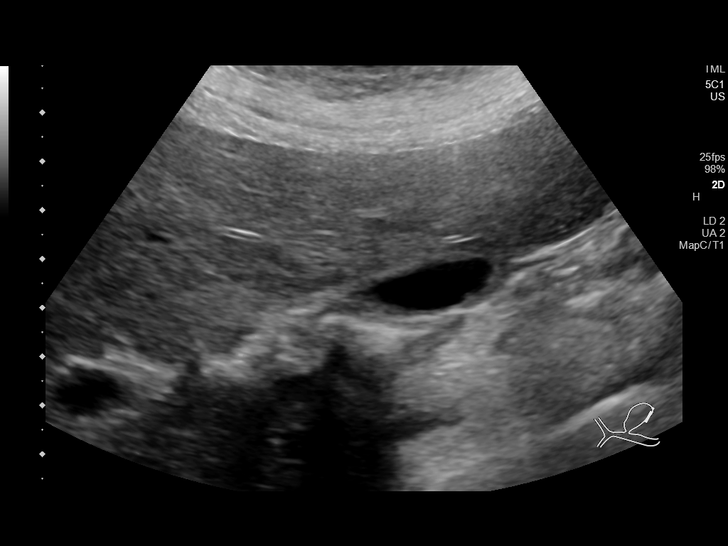
[im 39/155]
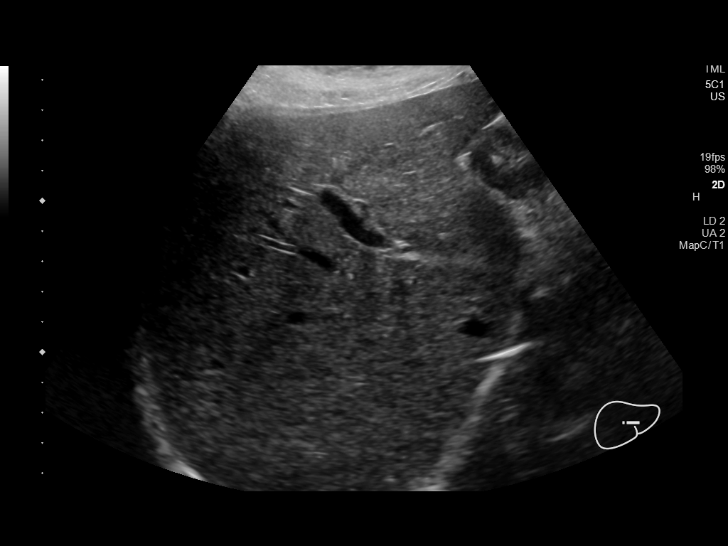
[im 52/155]
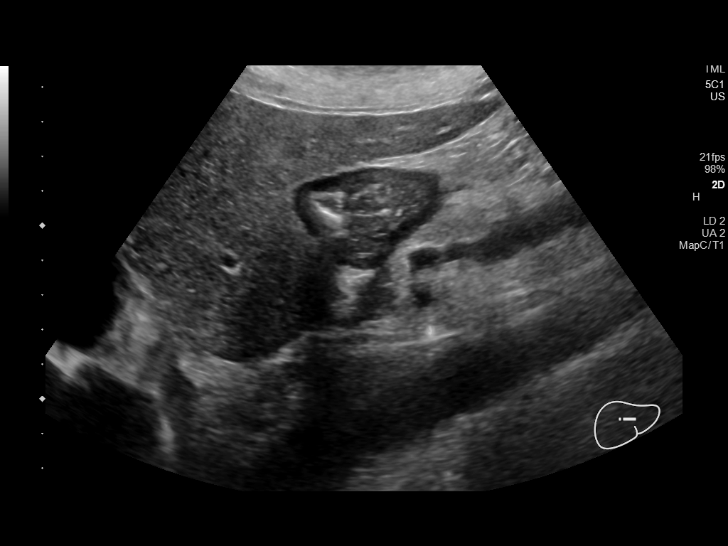
[im 65/155]
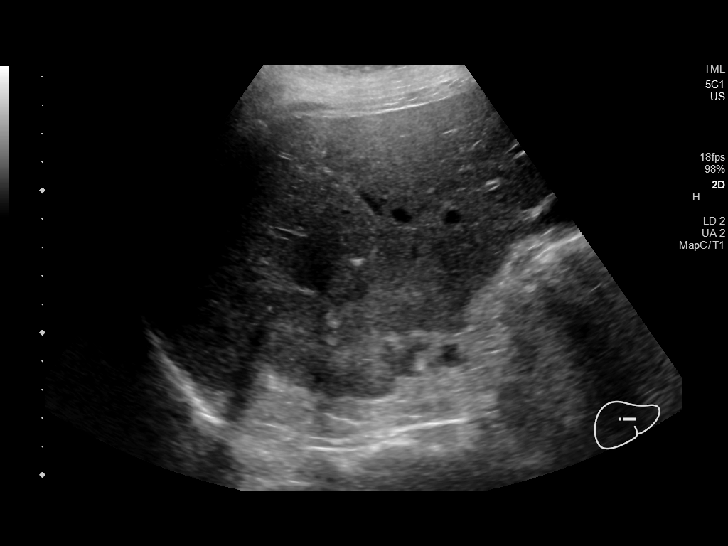
[im 78/155]
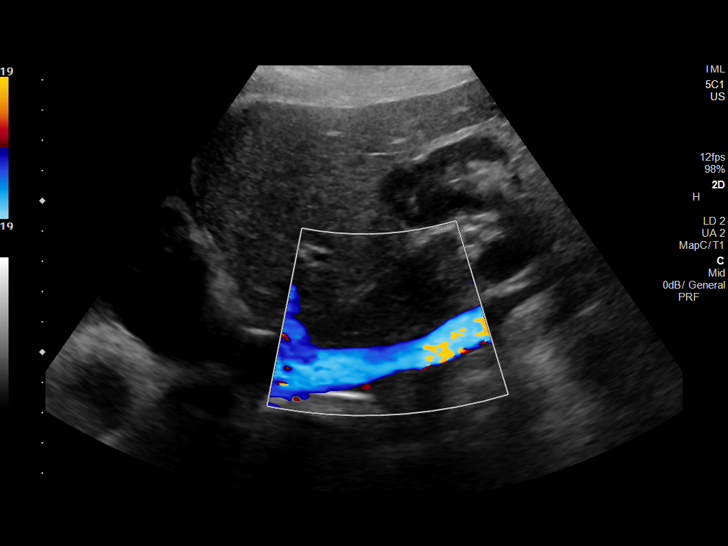
[im 90/155]
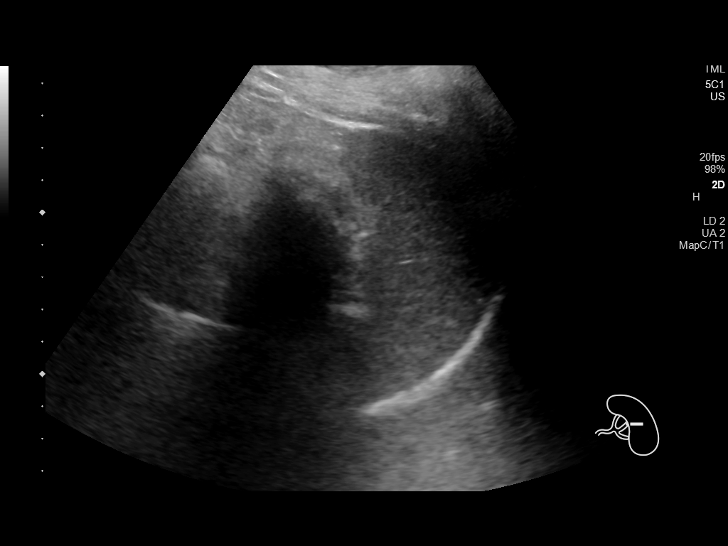
[im 103/155]
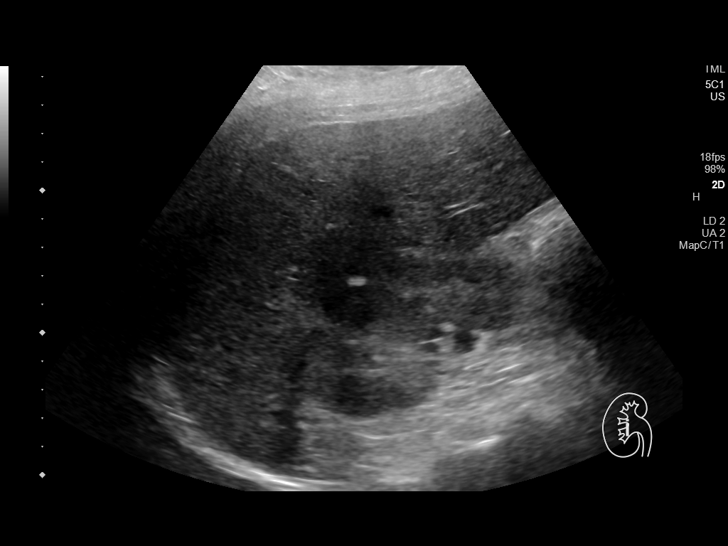
[im 116/155]
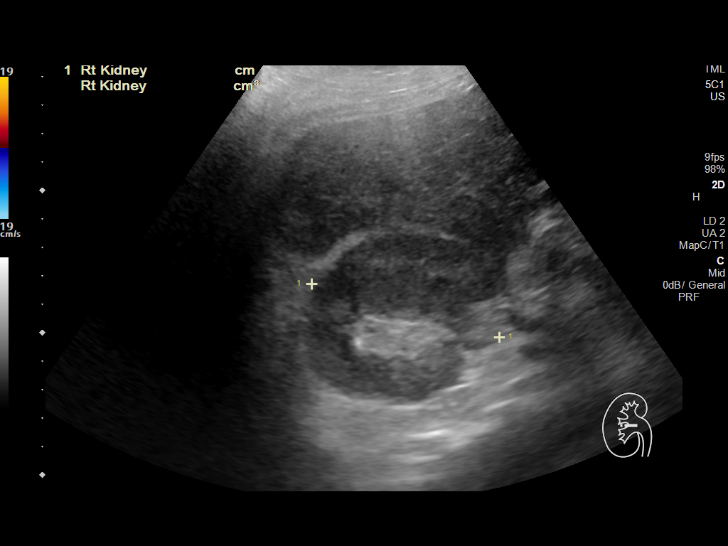
[im 129/155]
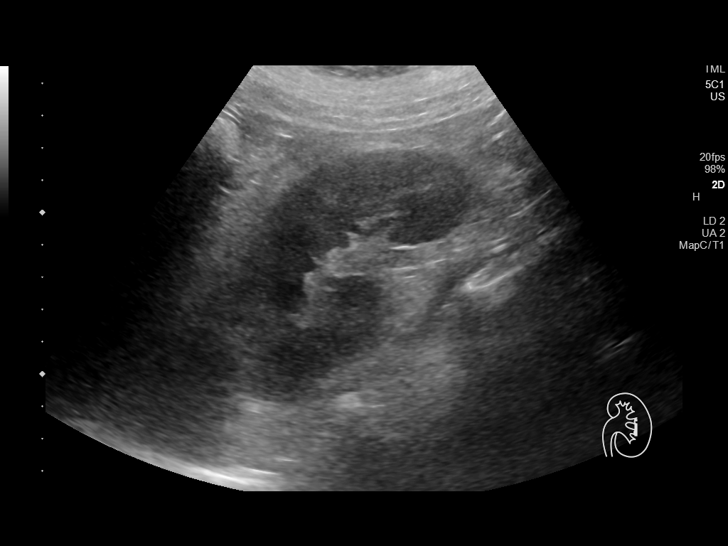
[im 142/155]
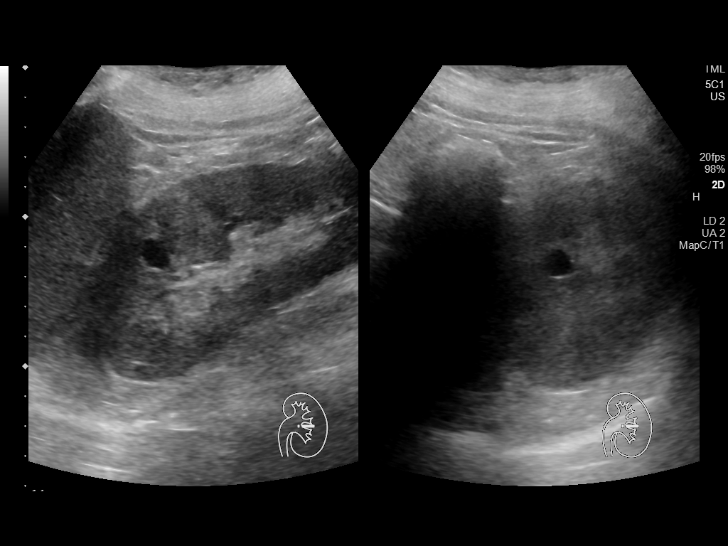
[im 155/155]
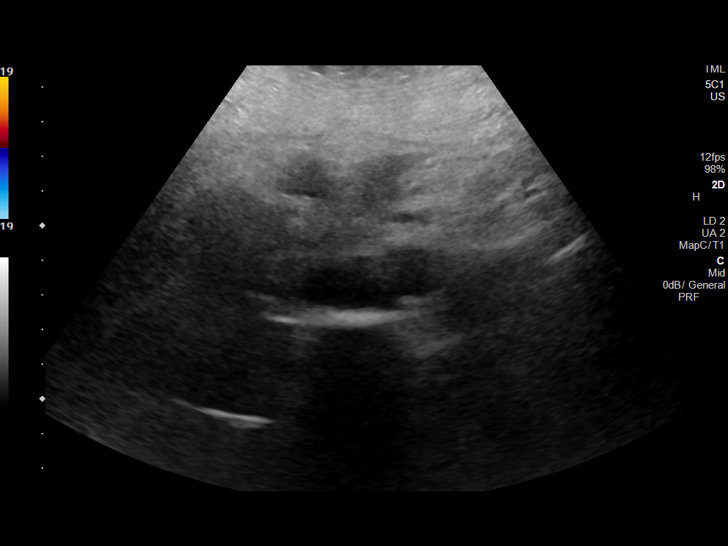

[13 of 25 positions shown; findings below may reference images not displayed]

FINDINGS: Gallbladder: Contracted gallbladder lumen. Avascular layering
echogenic material within the gallbladder lumen with no frank
associated posterior acoustic shadowing. No gallbladder wall
thickening or pericholecystic fluid visualized. No sonographic
Murphy sign noted by sonographer.

Common bile duct: Diameter: 5 mm.

Liver: No focal lesion identified. Within normal limits in
parenchymal echogenicity. Portal vein is patent on color Doppler
imaging with normal direction of blood flow towards the liver.

IVC: No abnormality visualized.

Pancreas: Visualized portion unremarkable.

Spleen: Size and appearance within normal limits.

Right Kidney: Length: 11.7 cm. Echogenicity within normal limits.
There is a simple cystic lesion measuring up to 3 cm within the
right kidney. No solid mass or hydronephrosis visualized.

Left Kidney: Length: 10.8 cm. Echogenicity within normal limits.
There is a 1.1 x 1 x 1 cm simple cystic lesion. No solid mass or
hydronephrosis visualized.

Abdominal aorta: No aneurysm visualized.

Other findings: None.
IMPRESSION: Findings suggestive of tumefactive gallbladder sludge within the
gallbladder lumen.

## 2021-10-23 ENCOUNTER — Other Ambulatory Visit: Payer: Self-pay | Admitting: Physical Medicine and Rehabilitation

## 2021-10-23 MED ORDER — SULFAMETHOXAZOLE-TRIMETHOPRIM 200-40 MG/5ML PO SUSP: 10.0000 mL | Freq: Two times a day (BID) | ORAL | 0 refills | Status: DC

## 2022-01-15 ENCOUNTER — Other Ambulatory Visit: Payer: Self-pay | Admitting: Physical Medicine and Rehabilitation

## 2022-01-15 MED ORDER — SULFAMETHOXAZOLE-TRIMETHOPRIM 200-40 MG/5ML PO SUSP: 20.0000 mL | Freq: Two times a day (BID) | ORAL | 0 refills | Status: AC

## 2022-03-17 ENCOUNTER — Encounter: Payer: 59 | Admitting: Physical Medicine and Rehabilitation

## 2022-04-15 ENCOUNTER — Ambulatory Visit: Payer: 59 | Admitting: Physical Medicine and Rehabilitation

## 2022-05-14 ENCOUNTER — Emergency Department (HOSPITAL_COMMUNITY)
Admission: EM | Admit: 2022-05-14 | Discharge: 2022-05-14 | Discharge status: Against Medical Advice | Payer: 59 | Attending: Emergency Medicine | Admitting: Emergency Medicine

## 2022-05-14 ENCOUNTER — Encounter (HOSPITAL_COMMUNITY): Payer: Self-pay

## 2022-05-14 DIAGNOSIS — D649 Anemia, unspecified: Secondary | ICD-10-CM | POA: Insufficient documentation

## 2022-05-14 DIAGNOSIS — Z5321 Procedure and treatment not carried out due to patient leaving prior to being seen by health care provider: Secondary | ICD-10-CM | POA: Diagnosis not present

## 2022-05-14 DIAGNOSIS — N289 Disorder of kidney and ureter, unspecified: Secondary | ICD-10-CM

## 2022-05-14 DIAGNOSIS — Z7901 Long term (current) use of anticoagulants: Secondary | ICD-10-CM | POA: Diagnosis not present

## 2022-05-14 LAB — CBC WITH DIFFERENTIAL/PLATELET
Abs Immature Granulocytes: 0.01 10*3/uL (ref 0.00–0.07)
Basophils Absolute: 0 10*3/uL (ref 0.0–0.1)
Basophils Relative: 1 %
Eosinophils Absolute: 0.1 10*3/uL (ref 0.0–0.5)
Eosinophils Relative: 2 %
HCT: 43 % (ref 36.0–46.0)
Hemoglobin: 12.7 g/dL (ref 12.0–15.0)
Immature Granulocytes: 0 %
Lymphocytes Relative: 20 %
Lymphs Abs: 0.9 10*3/uL (ref 0.7–4.0)
MCH: 28 pg (ref 26.0–34.0)
MCHC: 29.5 g/dL — ABNORMAL LOW (ref 30.0–36.0)
MCV: 94.7 fL (ref 80.0–100.0)
Monocytes Absolute: 0.3 10*3/uL (ref 0.1–1.0)
Monocytes Relative: 7 %
Neutro Abs: 3.1 10*3/uL (ref 1.7–7.7)
Neutrophils Relative %: 70 %
Platelets: 167 10*3/uL (ref 150–400)
RBC: 4.54 MIL/uL (ref 3.87–5.11)
RDW: 15.2 % (ref 11.5–15.5)
WBC: 4.5 10*3/uL (ref 4.0–10.5)
nRBC: 0 % (ref 0.0–0.2)

## 2022-05-14 LAB — BASIC METABOLIC PANEL WITH GFR
Anion gap: 6 (ref 5–15)
BUN: 28 mg/dL — ABNORMAL HIGH (ref 6–20)
CO2: 21 mmol/L — ABNORMAL LOW (ref 22–32)
Calcium: 8.5 mg/dL — ABNORMAL LOW (ref 8.9–10.3)
Chloride: 113 mmol/L — ABNORMAL HIGH (ref 98–111)
Creatinine, Ser: 3.09 mg/dL — ABNORMAL HIGH (ref 0.44–1.00)
GFR, Estimated: 19 mL/min — ABNORMAL LOW (ref >60)
Glucose, Bld: 203 mg/dL — ABNORMAL HIGH (ref 70–99)
Potassium: 5.1 mmol/L (ref 3.5–5.1)
Sodium: 140 mmol/L (ref 135–145)

## 2022-05-14 LAB — CBC
HCT: 23.3 % — ABNORMAL LOW (ref 36.0–46.0)
Hemoglobin: 6.9 g/dL — CL (ref 12.0–15.0)
MCH: 28.6 pg (ref 26.0–34.0)
MCHC: 29.6 g/dL — ABNORMAL LOW (ref 30.0–36.0)
MCV: 96.7 fL (ref 80.0–100.0)
Platelets: 267 K/uL (ref 150–400)
RBC: 2.41 MIL/uL — ABNORMAL LOW (ref 3.87–5.11)
RDW: 15.1 % (ref 11.5–15.5)
WBC: 9.5 K/uL (ref 4.0–10.5)
nRBC: 0 % (ref 0.0–0.2)

## 2022-05-14 LAB — I-STAT BETA HCG BLOOD, ED (MC, WL, AP ONLY): I-stat hCG, quantitative: <5 m[IU]/mL (ref <5)

## 2022-05-14 LAB — CBG MONITORING, ED: Glucose-Capillary: 188 mg/dL — ABNORMAL HIGH (ref 70–99)

## 2022-05-14 LAB — PREPARE RBC (CROSSMATCH)

## 2022-05-14 MED ORDER — SODIUM CHLORIDE 0.9 % IV BOLUS
500.0000 mL | Freq: Once | INTRAVENOUS | Status: AC
  Administered 2022-05-14: 500 mL via INTRAVENOUS

## 2022-05-14 MED ORDER — SODIUM CHLORIDE 0.9 % IV SOLN
10.0000 mL/h | Freq: Once | INTRAVENOUS | Status: AC
  Administered 2022-05-14: 10 mL/h via INTRAVENOUS

## 2022-05-14 NOTE — ED Notes (Signed)
Pt given crackers and cranberry juice.

## 2022-05-14 NOTE — ED Triage Notes (Signed)
Pt arrived via POV, states received a call from PCP and told her hemoglobin was low. States hx of blood transfusions. Endorses fatigue, dizziness.

## 2022-05-14 NOTE — ED Provider Triage Note (Signed)
Emergency Medicine Provider Triage Evaluation Note  Amarianna Abplanalp , a 41 y.o. female  was evaluated in triage.  Pt complains of low hemoglobin.  Patient has history of heavy menstrual periods and take Xarelto for history of PE.Marland Kitchen  Review of Systems  Positive: Dizzy lightheaded Negative: Syncope  Physical Exam  BP (!) 207/93   Pulse 73   Temp 98.2 F (36.8 C) (Oral)   Resp 18   Ht 1.651 m ('5\' 5"'$ )   Wt 109.8 kg   SpO2 100%   BMI 40.27 kg/m  Gen:   Awake, no distress   Resp:  Normal effort  MSK:   Moves extremities without difficulty  Other:    Medical Decision Making  Medically screening exam initiated at 10:28 AM.  Appropriate orders placed.  Chesnee Floren was informed that the remainder of the evaluation will be completed by another provider, this initial triage assessment does not replace that evaluation, and the importance of remaining in the ED until their evaluation is complete.  Patient to have blood work performed and blood transfusion if needed   Lacretia Leigh, MD 05/14/22 1029

## 2022-05-14 NOTE — Discharge Instructions (Addendum)
You were given 1 unit blood transfusion today and you will need to follow-up closely to have your hemoglobin rechecked.  Your kidney function has been steadily worsening over the past few months.  It is very important that you follow-up with your primary care doctor and with your nephrologist regarding this.  Make sure you are drinking enough water.  Make sure you are continuing to take your diabetes and blood pressure medications and keeping a close eye on your blood sugar as this could worsen your kidney function as well.  Return to the emergency department if you have any worsening symptoms.

## 2022-05-14 NOTE — ED Provider Notes (Signed)
[facility] DEPT Provider Note   CSN: [id number] Arrival date & time: 05/14/22  1002     History  Chief Complaint  Patient presents with   Abnormal Lab    Sherri Dixon is a 41 y.o. female.  Sherri Dixon is a 41 y.o. female with a history of hypertension, hyperlipidemia, diabetes, PE, CKD, who presents to the emergency department for evaluation of low hemoglobin.  She was called by her PCP office and told that she had a hemoglobin of 6.  She reports that she has a history of heavy menstrual periods and takes Xarelto due to history of a prior PE.  Patient reports that she has had to have blood transfusions in the past.  Reports that she is not currently having any vaginal bleeding.  No blood in her stool or dark black stools.  Reports that she has been feeling dizzy and lightheaded with fatigue but has not had syncope.  No chest pain or shortness of breath.  The history is provided by the patient and medical records.  Abnormal Lab      Home Medications Prior to Admission medications   Medication Sig Start Date End Date Taking? Authorizing Provider  AZO D-MANNOSE 500 MG CAPS Take 1 capsule by mouth daily.   Yes [provider]  VISINE-AC 0.05-0.25 % ophthalmic solution Place 2 drops into both eyes 3 (three) times daily as needed (for irritation).   Yes [provider]  XARELTO 20 MG TABS tablet Take 20 mg by mouth daily. 10/15/20  Yes [provider]  Alcohol Swabs 70 % PADS 1 application by Does not apply route 3 (three) times daily before meals. 06/06/16   Dhungel, Flonnie Overman, MD  dapagliflozin propanediol (FARXIGA) 10 MG TABS tablet Take 10 mg by mouth daily.    [provider]  docusate sodium (COLACE) 100 MG capsule Take 2 capsules (200 mg total) by mouth daily. Patient taking differently: Take 200 mg by mouth daily as needed. 04/26/21   Love, Ivan Anchors, PA-C  furosemide (LASIX) 20 MG tablet Take 1 tablet (20 mg total)  by mouth 2 (two) times daily. 04/25/21   Love, Ivan Anchors, PA-C  gabapentin (NEURONTIN) 300 MG capsule Take 1 capsule (300 mg total) by mouth daily as needed (pain). 06/27/21   Raulkar, Clide Deutscher, MD  glucose blood test strip Use as instructed 06/06/16   Dhungel, Nishant, MD  hydrALAZINE (APRESOLINE) 50 MG tablet Take 1 tablet (50 mg total) by mouth 3 (three) times daily. Patient not taking: Reported on 10/08/2021 12/15/20   Bonnielee Haff, MD  insulin aspart (NOVOLOG FLEXPEN) 100 UNIT/ML FlexPen Inject 0-20 Units as directed 3 (three) times daily. Sliding scale Patient not taking: Reported on 10/08/2021 01/03/19   [provider]  Insulin Syringe-Needle U-100 (INSULIN SYRINGE 1CC/28G) 28G X 1/2" 1 ML MISC 1 application by Does not apply route 3 (three) times daily before meals. 06/06/16   Dhungel, Flonnie Overman, MD  metoprolol succinate (TOPROL-XL) 100 MG 24 hr tablet Take 1 tablet (100 mg total) by mouth daily. Patient not taking: Reported on 05/14/2022 04/25/21   Love, Ivan Anchors, PA-C  mineral oil-hydrophilic petrolatum (AQUAPHOR) ointment Apply 1 application topically as needed (eczema).    [provider]  olmesartan (BENICAR) 40 MG tablet Take 1 tablet (40 mg total) by mouth daily. 01/30/21   Thurnell Lose, MD  pantoprazole (PROTONIX) 40 MG tablet Take 1 tablet (40 mg total) by mouth daily at 6 PM. 04/25/21   Love,  Ivan Anchors, PA-C  rosuvastatin (CRESTOR) 40 MG tablet Take 1 tablet (40 mg total) by mouth daily. 01/29/21   Thurnell Lose, MD  Vitamin D, Ergocalciferol, (DRISDOL) 1.25 MG (50000 UNIT) CAPS capsule Take 50,000 Units by mouth once a week. 03/19/21   [provider]      Allergies    Amlodipine and Humalog kwikpen [insulin lispro]    Review of Systems   Review of Systems  Constitutional:  Positive for fatigue. Negative for chills and fever.  HENT: Negative.    Respiratory:  Negative for cough and shortness of breath.   Cardiovascular:  Negative for chest pain.   Gastrointestinal:  Negative for abdominal pain and blood in stool.  Genitourinary:  Negative for vaginal bleeding and vaginal discharge.  Neurological:  Positive for dizziness and light-headedness. Negative for syncope.  All other systems reviewed and are negative.   Physical Exam Updated Vital Signs BP (!) 149/70   Pulse 69   Temp 98.3 F (36.8 C) (Oral)   Resp 16   Ht '5\' 5"'$  (1.651 m)   Wt 109.8 kg   SpO2 100%   BMI 40.27 kg/m  Physical Exam Vitals and nursing note reviewed.  Constitutional:      General: She is not in acute distress.    Appearance: Normal appearance. She is well-developed. She is not ill-appearing or diaphoretic.  HENT:     Head: Normocephalic and atraumatic.     Mouth/Throat:     Mouth: Mucous membranes are moist.     Pharynx: Oropharynx is clear.  Eyes:     General:        Right eye: No discharge.        Left eye: No discharge.     Pupils: Pupils are equal, round, and reactive to light.  Cardiovascular:     Rate and Rhythm: Normal rate and regular rhythm.     Pulses: Normal pulses.     Heart sounds: Normal heart sounds.  Pulmonary:     Effort: Pulmonary effort is normal. No respiratory distress.     Breath sounds: Normal breath sounds. No wheezing, rhonchi or rales.     Comments: Respirations equal and unlabored, patient able to speak in full sentences, lungs clear to auscultation bilaterally  Abdominal:     General: Bowel sounds are normal. There is no distension.     Palpations: Abdomen is soft. There is no mass.     Tenderness: There is no abdominal tenderness. There is no guarding.     Comments: Abdomen soft, nondistended, nontender to palpation in all quadrants without guarding or peritoneal signs  Musculoskeletal:        General: No deformity.     Cervical back: Neck supple.  Skin:    General: Skin is warm and dry.     Capillary Refill: Capillary refill takes less than 2 seconds.     Coloration: Skin is pale.  Neurological:     Mental  Status: She is alert and oriented to person, place, and time.     Coordination: Coordination normal.     Comments: Speech is clear, able to follow commands Moves extremities without ataxia, coordination intact  Psychiatric:        Mood and Affect: Mood normal.        Behavior: Behavior normal.     ED Results / Procedures / Treatments   Labs (all labs ordered are listed, but only abnormal results are displayed) Labs Reviewed  CBC WITH DIFFERENTIAL/PLATELET - Abnormal;  Notable for the following components:      Result Value   MCHC 29.5 (*)    All other components within normal limits  BASIC METABOLIC PANEL - Abnormal; Notable for the following components:   Chloride 113 (*)    CO2 21 (*)    Glucose, Bld 203 (*)    BUN 28 (*)    Creatinine, Ser 3.09 (*)    Calcium 8.5 (*)    GFR, Estimated 19 (*)    All other components within normal limits  CBC - Abnormal; Notable for the following components:   RBC 2.41 (*)    Hemoglobin 6.9 (*)    HCT 23.3 (*)    MCHC 29.6 (*)    All other components within normal limits  CBG MONITORING, ED - Abnormal; Notable for the following components:   Glucose-Capillary 188 (*)    All other components within normal limits  I-STAT BETA HCG BLOOD, ED ([facility], [facility], [facility] ONLY)  TYPE AND SCREEN  PREPARE RBC (CROSSMATCH)    EKG None  Radiology No results found.  Procedures .Critical Care  Performed by: Jacqlyn Larsen, PA-C Authorized by: Jacqlyn Larsen, PA-C   Critical care provider statement:    Critical care time (minutes):  30   Critical care was necessary to treat or prevent imminent or life-threatening deterioration of the following conditions:  Circulatory failure (anemia requiring blood transfusion)   Critical care was time spent personally by me on the following activities:  Development of treatment plan with patient or surrogate, discussions with consultants, evaluation of patient's response to treatment, examination of patient, ordering and  review of laboratory studies, ordering and review of radiographic studies, ordering and performing treatments and interventions, pulse oximetry, re-evaluation of patient's condition and review of old charts     Medications Ordered in ED Medications  sodium chloride 0.9 % bolus 500 mL (0 mLs Intravenous Stopped 05/14/22 2104)  0.9 %  sodium chloride infusion (10 mL/hr Intravenous New Bag/Given 05/14/22 2113)    ED Course/ Medical Decision Making/ A&P                           Medical Decision Making Amount and/or Complexity of Data Reviewed Labs: ordered.  Risk Prescription drug management.   41 y.o. female presents to the ED with complaints of low hemoglobin, this involves an extensive number of treatment options, and is a complaint that carries with it a high risk of complications and morbidity.  The differential diagnosis includes chronic anemia, acute blood loss, vaginal bleeding, GI bleeding, CKD   On arrival pt is nontoxic, vitals hypertensive on arrival, otherwise vitals normal.   Additional history obtained from husband at bedside. Previous records obtained and reviewed including outside medical records through care everywhere which show hemoglobin checked yesterday was 6.7, creatinine yesterday was 2.9    Lab Tests:  I Ordered, reviewed, and interpreted labs, which included: Initial CBC showed hemoglobin of 12.7, I have high clinical suspicion for lab error given that lab work done just yesterday showed hemoglobin of 6.7, will recheck.  Repeat hemoglobin showed 6.9, no leukocytosis and normal platelets.  Creatinine is slightly worsened at 3.09, with mildly elevated BUN.  No other significant electrolyte derangements.  Negative pregnancy.  Patient not currently having any vaginal bleeding.  Type and screen ordered.   ED Course:   Blood transfusion ordered given hemoglobin of 6.9.  Fortunately no current bleeding.  Patient's creatinine is slightly worsened  compared to labs in  care everywhere, was slightly elevated yesterday as well.  This could be in the setting of anemia.  Blood transfusion as well as 500 cc fluid bolus given.  I had shared decision-making discussion with patient and husband at bedside, given anemia and slight worsening of kidney function discussed potential admission, but patient would prefer to receive blood transfusion and follow-up as an outpatient.  She has close upcoming follow-up with her nephrologist as well as her PCP.  We will have her hemoglobin checked within the next week.    After receiving blood transfusion patient remains hemodynamically stable.  Discharged home with strict return precautions and follow-up instructions given.  Portions of this note were generated with [employer]. Dictation errors may occur despite best attempts at proofreading.         Final Clinical Impression(s) / ED Diagnoses Final diagnoses:  Symptomatic anemia  Decreased renal function    Rx / DC Orders ED Discharge Orders     None         Jacqlyn Larsen, [place] 06/07/22 1222    Wyvonnia Dusky, MD 06/09/22 1311

## 2022-05-15 LAB — BPAM RBC
Blood Product Expiration Date: 202309062359
ISSUE DATE / TIME: 202308162042
Unit Type and Rh: 6200

## 2022-05-15 LAB — TYPE AND SCREEN
ABO/RH(D): A POS
Antibody Screen: NEGATIVE
Unit division: 0

## 2022-05-23 ENCOUNTER — Encounter: Payer: 59 | Attending: Physical Medicine and Rehabilitation | Admitting: Physical Medicine and Rehabilitation

## 2022-05-23 ENCOUNTER — Encounter: Payer: Self-pay | Admitting: Physical Medicine and Rehabilitation

## 2022-05-23 VITALS — BP 180/85 | HR 64 | Ht 65.0 in | Wt 248.6 lb

## 2022-05-23 DIAGNOSIS — F411 Generalized anxiety disorder: Secondary | ICD-10-CM | POA: Diagnosis present

## 2022-05-23 DIAGNOSIS — I10 Essential (primary) hypertension: Secondary | ICD-10-CM | POA: Insufficient documentation

## 2022-05-23 NOTE — Progress Notes (Signed)
Subjective:    Patient ID: Sherri Dixon, female    DOB: 1981-01-11, 41 y.o.   MRN: [id number]  HPI 1) Cyst on right foot -ruptured by surgical PA -not painful  2) Impaired mobility and ADLs -much improved  3) HTN -took off olmesartan -still taking metoprolol and hydralazine.  -BP 180/85 -had a lot stress in family recently.  -checks 4 times a day and is usually 086V sytolic.   4) Anxiety -her hobby is going to the spa but she was unable to got because of her cataract surgery.   5) AKI -stopped farxiga and olmesartan   Pain Inventory Average Pain 0 Pain Right Now 0 My pain is  denies any pain  In the last 24 hours, has pain interfered with the following? General activity 0 Relation with others 0 Enjoyment of life 0 What TIME of day is your pain at its worst? No pain Sleep (in general) Poor  Pain is worse with:  no pain Pain improves with:  no pain Relief from Meds:  no pain med  Family History  Problem Relation Age of Onset   High blood pressure Mother    Diabetes Mother    Arthritis Mother    Hyperlipidemia Mother    Heart disease Mother    High blood pressure Father    Hyperlipidemia Father    Dementia Father    High blood pressure Sister    Diabetes Brother    Hyperlipidemia Brother    High blood pressure Brother    Colon cancer Neg Hx    Stomach cancer Neg Hx    Esophageal cancer Neg Hx    Pancreatic cancer Neg Hx    Liver disease Neg Hx    Social History   Socioeconomic History   Marital status: Married    Spouse name: Not on file   Number of children: Not on file   Years of education: Not on file   Highest education level: Not on file  Occupational History   Not on file  Tobacco Use   Smoking status: Never   Smokeless tobacco: Never  Vaping Use   Vaping Use: Never used  Substance and Sexual Activity   Alcohol use: Yes    Alcohol/week: 1.0 standard drink of alcohol    Types: 1 Glasses of wine per week    Comment: occas   Drug  use: No   Sexual activity: Yes    Birth control/protection: None  Other Topics Concern   Not on file  Social History Narrative   Not on file   Social Determinants of Health   Financial Resource Strain: Not on file  Food Insecurity: Not on file  Transportation Needs: Not on file  Physical Activity: Not on file  Stress: Not on file  Social Connections: Not on file   Past Surgical History:  Procedure Laterality Date   APPLICATION OF WOUND VAC Right 04/12/2021   Procedure: APPLICATION OF WOUND VAC;  Surgeon: Erle Crocker, MD;  Location: [facility];  Service: Orthopedics;  Laterality: Right;   I & D EXTREMITY Right 04/06/2021   Procedure: IRRIGATION AND DEBRIDEMENT TOE WITH WOUND VAC PLACEMENT;  Surgeon: Erle Crocker, MD;  Location: [facility];  Service: Orthopedics;  Laterality: Right;   I & D EXTREMITY Right 04/12/2021   Procedure: IRRIGATION AND DEBRIDEMENT RIGHT FOOT;  Surgeon: Erle Crocker, MD;  Location: [facility];  Service: Orthopedics;  Laterality: Right;   MOUTH SURGERY     ROBOTIC ASSITED PARTIAL  NEPHRECTOMY Right 07/28/2019   Procedure: XI ROBOTIC ASSITED PARTIAL NEPHRECTOMY;  Surgeon: Cleon Gustin, MD;  Location: [facility];  Service: Urology;  Laterality: Right;  3 HRS   SECONDARY CLOSURE OF WOUND Right 06/18/2021   Procedure: SECONDARY CLOSURE OF WOUND tRANSMETATARSAL AMPUTAION;  Surgeon: Erle Crocker, MD;  Location: [facility];  Service: Orthopedics;  Laterality: Right;   TRANSMETATARSAL AMPUTATION Right 04/12/2021   Procedure: TRANSMETATARSAL AMPUTATION;  Surgeon: Erle Crocker, MD;  Location: [facility];  Service: Orthopedics;  Laterality: Right;   Past Surgical History:  Procedure Laterality Date   APPLICATION OF WOUND VAC Right 04/12/2021   Procedure: APPLICATION OF WOUND VAC;  Surgeon: Erle Crocker, MD;  Location: [facility];  Service: Orthopedics;  Laterality: Right;   I & D EXTREMITY Right 04/06/2021   Procedure: IRRIGATION AND DEBRIDEMENT TOE WITH  WOUND VAC PLACEMENT;  Surgeon: Erle Crocker, MD;  Location: [place] [facility];  Service: Orthopedics;  Laterality: Right;   I & D EXTREMITY Right 04/12/2021   Procedure: IRRIGATION AND DEBRIDEMENT RIGHT FOOT;  Surgeon: Erle Crocker, MD;  Location: [facility];  Service: Orthopedics;  Laterality: Right;   MOUTH SURGERY     ROBOTIC ASSITED PARTIAL NEPHRECTOMY Right 07/28/2019   Procedure: XI ROBOTIC ASSITED PARTIAL NEPHRECTOMY;  Surgeon: Cleon Gustin, MD;  Location: [facility];  Service: Urology;  Laterality: Right;  3 HRS   SECONDARY CLOSURE OF WOUND Right 06/18/2021   Procedure: SECONDARY CLOSURE OF WOUND tRANSMETATARSAL AMPUTAION;  Surgeon: Erle Crocker, MD;  Location: [facility];  Service: Orthopedics;  Laterality: Right;   TRANSMETATARSAL AMPUTATION Right 04/12/2021   Procedure: TRANSMETATARSAL AMPUTATION;  Surgeon: Erle Crocker, MD;  Location: [facility];  Service: Orthopedics;  Laterality: Right;   Past Medical History:  Diagnosis Date   Anemia    Asthma    CHF (congestive heart failure) (HCC)    Pt reports that father has CHF, not her   Diabetes mellitus without complication (HCC)    Dyslipidemia    GERD (gastroesophageal reflux disease)    Pulmonary embolism (HCC)    Stroke (HCC)    BP (!) 180/85   Pulse 64   Ht '5\' 5"'$  (1.651 m)   Wt 248 lb 9.6 oz (112.8 kg)   SpO2 98%   BMI 41.37 kg/m   Opioid Risk Score:   Fall Risk Score:  `1  Depression screen [facility] 2/9     05/23/2022    9:22 AM 06/27/2021   10:12 AM  Depression screen PHQ 2/9  Decreased Interest 2 2  Down, Depressed, Hopeless 1 1  PHQ - 2 Score 3 3  Altered sleeping  2  Tired, decreased energy  3  Change in appetite  3  Feeling bad or failure about yourself   2  Trouble concentrating  1  Moving slowly or fidgety/restless  2  Suicidal thoughts  0  PHQ-9 Score  16  Difficult doing work/chores  Very difficult    Review of Systems  Constitutional: Negative.   HENT: Negative.    Eyes: Negative.    Respiratory: Negative.    Cardiovascular: Negative.   Gastrointestinal: Negative.   Endocrine: Negative.   Genitourinary: Negative.   Musculoskeletal: Negative.   Skin:        Has area around incision from toes amp right foot that had a cyst drained by Dr Pollie Friar PA  Allergic/Immunologic: Negative.   Neurological: Negative.   Hematological:  Bruises/bleeds easily.       Xarelto  Psychiatric/Behavioral:  Positive for dysphoric mood.   All other systems reviewed and are negative.      Objective:   Physical Exam  Gen: no distress, normal appearing HEENT: oral mucosa pink and moist, NCAT Cardio: Reg rate Chest: normal effort, normal rate of breathing Abd: soft, non-distended Ext: no edema Psych: pleasant, normal affect Skin: cyst  right foot Neuro: Alert and oriented x3.      Assessment & Plan:   1) HTN: -BP is 180/85 today.  -Advised checking BP daily at home and logging results to bring into follow-up appointment with PCP and myself. -Reviewed BP meds today.  -Advised regarding healthy foods that can help lower blood pressure and provided with a list: 1) citrus foods- high in vitamins and minerals 2) salmon and other fatty fish - reduces inflammation and oxylipins 3) swiss chard (leafy green)- high level of nitrates 4) pumpkin seeds- one of the best natural sources of magnesium 5) Beans and lentils- high in fiber, magnesium, and potassium 6) Berries- high in flavonoids 7) Amaranth (whole grain, can be cooked similarly to rice and oats)- high in magnesium and fiber 8) Pistachios- even more effective at reducing BP than other nuts 9) Carrots- high in phenolic compounds that relax blood vessels and reduce inflammation 10) Celery- contain phthalides that relax tissues of arterial walls 11) Tomatoes- can also improve cholesterol and reduce risk of heart disease 12) Broccoli- good source of magnesium, calcium, and potassium 13) Greek yogurt: high in potassium and  calcium 14) Herbs and spices: Celery seed, cilantro, saffron, lemongrass, black cumin, ginseng, cinnamon, cardamom, sweet basil, and ginger 15) Chia and flax seeds- also help to lower cholesterol and blood sugar 16) Beets- high levels of nitrates that relax blood vessels  17) spinach and bananas- high in potassium  -Provided lise of supplements that can help with hypertension:  1) magnesium: one high quality brand is Bioptemizers since it contains all 7 types of magnesium, otherwise over the counter magnesium gluconate '400mg'$  is a good option 2) B vitamins 3) vitamin D 4) potassium 5) CoQ10 6) L-arginine 7) Vitamin C 8) Beetroot -Educated that goal BP is 120/80. -Made goal to incorporate some of the above foods into diet.    2) Anxiety: -Discussed exercise and meditation as tools to decrease anxiety. -Recommended Down Dog Yoga app -Discussed spending time outdoors. -Discussed positive re-framing of anxiety.  -Discussed the following foods that have been show to reduce anxiety: 1) [place] nuts, mushrooms, soy beans due to their high selenium content. Upper limit of toxicity of selenium is 438mg/day so no more than 3-4 bBolivianuts per day.  2) Fatty fish such as salmon, mackerel, sardines, trout, and herring- high in omega-3 fatty acids 3) Eggs- increases serotonin and dopamine 4) Pumpkin seeds- high in omega-3 fatty acids 5) dark chocolate- high in flavanols that increase blood flow to brain 6) turmeric- take with black pepper to increase absorption 7) chamomile tea- antioxidant and anti-inflammatory properties 8) yogurt without sugar- supports gut-brain axis 9) green tea- contains L- theanine 10) blueberries- high in vitamin C and antioxidants 11) [place] high in tryptophan which gets converted to serotonin 12) bell peppers- rich in vitamin C and antioxidants 13) citrus fruits- rich in vitamin C and antioxidants 14) almonds- high in vitamin E and healthy fats 15) chia seeds- high  in omega-3 fatty acids  3) AKI -discussed creatinine level was 3.09 on last check -recommend 6-8 glasses of water per day  4) Type 2 DM -recommended no snacking  after dinner -recommended decreasing short acting insulin by 1U once CBGs consistently under 200

## 2022-05-23 NOTE — Patient Instructions (Addendum)
Magnesium glycinate  HTN: -Advised checking BP daily at home and logging results to bring into follow-up appointment with PCP and myself. -Reviewed BP meds today.  -Advised regarding healthy foods that can help lower blood pressure and provided with a list: 1) citrus foods- high in vitamins and minerals 2) salmon and other fatty fish - reduces inflammation and oxylipins 3) swiss chard (leafy green)- high level of nitrates 4) pumpkin seeds- one of the best natural sources of magnesium 5) Beans and lentils- high in fiber, magnesium, and potassium 6) Berries- high in flavonoids 7) Amaranth (whole grain, can be cooked similarly to rice and oats)- high in magnesium and fiber 8) Pistachios- even more effective at reducing BP than other nuts 9) Carrots- high in phenolic compounds that relax blood vessels and reduce inflammation 10) Celery- contain phthalides that relax tissues of arterial walls 11) Tomatoes- can also improve cholesterol and reduce risk of heart disease 12) Broccoli- good source of magnesium, calcium, and potassium 13) Greek yogurt: high in potassium and calcium 14) Herbs and spices: Celery seed, cilantro, saffron, lemongrass, black cumin, ginseng, cinnamon, cardamom, sweet basil, and ginger 15) Chia and flax seeds- also help to lower cholesterol and blood sugar 16) Beets- high levels of nitrates that relax blood vessels  17) spinach and bananas- high in potassium   -Provided lise of supplements that can help with hypertension:  1) magnesium: one high quality brand is Bioptemizers since it contains all 7 types of magnesium, otherwise over the counter magnesium gluconate '400mg'$  is a good option 2) B vitamins 3) vitamin D 4) potassium 5) CoQ10 6) L-arginine 7) Vitamin C 8) Beetroot -Educated that goal BP is 120/80. -Made goal to incorporate some of the above foods into diet.     Anxiety: -Discussed exercise and meditation as tools to decrease anxiety. -Recommended Down  Dog Yoga app -Discussed spending time outdoors. -Discussed positive re-framing of anxiety.  -Discussed the following foods that have been show to reduce anxiety: 1) [place] nuts, mushrooms, soy beans due to their high selenium content. Upper limit of toxicity of selenium is 43mg/day so no more than 3-4 bBolivianuts per day.  2) Fatty fish such as salmon, mackerel, sardines, trout, and herring- high in omega-3 fatty acids 3) Eggs- increases serotonin and dopamine 4) Pumpkin seeds- high in omega-3 fatty acids 5) dark chocolate- high in flavanols that increase blood flow to brain 6) turmeric- take with black pepper to increase absorption 7) chamomile tea- antioxidant and anti-inflammatory properties 8) yogurt without sugar- supports gut-brain axis 9) green tea- contains L- theanine 10) blueberries- high in vitamin C and antioxidants 11) [place] high in tryptophan which gets converted to serotonin 12) bell peppers- rich in vitamin C and antioxidants 13) citrus fruits- rich in vitamin C and antioxidants 14) almonds- high in vitamin E and healthy fats 15) chia seeds- high in omega-3 fatty acids

## 2022-07-21 ENCOUNTER — Ambulatory Visit: Payer: 59 | Admitting: Physical Medicine and Rehabilitation

## 2022-07-23 ENCOUNTER — Encounter (HOSPITAL_COMMUNITY): Payer: Self-pay

## 2022-07-23 ENCOUNTER — Emergency Department (HOSPITAL_COMMUNITY)
Admission: EM | Admit: 2022-07-23 | Discharge: 2022-07-24 | Disposition: A | Discharge status: Home / Self Care | Payer: 59 | Attending: Emergency Medicine | Admitting: Emergency Medicine

## 2022-07-23 DIAGNOSIS — Z794 Long term (current) use of insulin: Secondary | ICD-10-CM | POA: Insufficient documentation

## 2022-07-23 DIAGNOSIS — R531 Weakness: Secondary | ICD-10-CM | POA: Insufficient documentation

## 2022-07-23 DIAGNOSIS — D649 Anemia, unspecified: Secondary | ICD-10-CM | POA: Diagnosis not present

## 2022-07-23 DIAGNOSIS — R0600 Dyspnea, unspecified: Secondary | ICD-10-CM | POA: Diagnosis not present

## 2022-07-23 DIAGNOSIS — R799 Abnormal finding of blood chemistry, unspecified: Secondary | ICD-10-CM | POA: Diagnosis present

## 2022-07-23 DIAGNOSIS — Z7901 Long term (current) use of anticoagulants: Secondary | ICD-10-CM | POA: Insufficient documentation

## 2022-07-23 LAB — CBC WITH DIFFERENTIAL/PLATELET
Abs Immature Granulocytes: 0.02 10*3/uL (ref 0.00–0.07)
Basophils Absolute: 0 10*3/uL (ref 0.0–0.1)
Basophils Relative: 1 %
Eosinophils Absolute: 0.2 10*3/uL (ref 0.0–0.5)
Eosinophils Relative: 2 %
HCT: 22.3 % — ABNORMAL LOW (ref 36.0–46.0)
Hemoglobin: 6.5 g/dL — CL (ref 12.0–15.0)
Immature Granulocytes: 0 %
Lymphocytes Relative: 28 %
Lymphs Abs: 2.2 10*3/uL (ref 0.7–4.0)
MCH: 25.7 pg — ABNORMAL LOW (ref 26.0–34.0)
MCHC: 29.1 g/dL — ABNORMAL LOW (ref 30.0–36.0)
MCV: 88.1 fL (ref 80.0–100.0)
Monocytes Absolute: 0.5 10*3/uL (ref 0.1–1.0)
Monocytes Relative: 6 %
Neutro Abs: 5 10*3/uL (ref 1.7–7.7)
Neutrophils Relative %: 63 %
Platelets: 222 10*3/uL (ref 150–400)
RBC: 2.53 MIL/uL — ABNORMAL LOW (ref 3.87–5.11)
RDW: 13.5 % (ref 11.5–15.5)
WBC: 7.9 10*3/uL (ref 4.0–10.5)
nRBC: 0 % (ref 0.0–0.2)

## 2022-07-23 LAB — COMPREHENSIVE METABOLIC PANEL
ALT: 19 U/L (ref 0–44)
AST: 17 U/L (ref 15–41)
Albumin: 2.8 g/dL — ABNORMAL LOW (ref 3.5–5.0)
Alkaline Phosphatase: 111 U/L (ref 38–126)
Anion gap: 5 (ref 5–15)
BUN: 18 mg/dL (ref 6–20)
CO2: 20 mmol/L — ABNORMAL LOW (ref 22–32)
Calcium: 8.1 mg/dL — ABNORMAL LOW (ref 8.9–10.3)
Chloride: 114 mmol/L — ABNORMAL HIGH (ref 98–111)
Creatinine, Ser: 1.94 mg/dL — ABNORMAL HIGH (ref 0.44–1.00)
GFR, Estimated: 32 mL/min — ABNORMAL LOW (ref >60)
Glucose, Bld: 106 mg/dL — ABNORMAL HIGH (ref 70–99)
Potassium: 4 mmol/L (ref 3.5–5.1)
Sodium: 139 mmol/L (ref 135–145)
Total Bilirubin: 0.3 mg/dL (ref 0.3–1.2)
Total Protein: 5.6 g/dL — ABNORMAL LOW (ref 6.5–8.1)

## 2022-07-23 LAB — PREPARE RBC (CROSSMATCH)

## 2022-07-23 LAB — I-STAT BETA HCG BLOOD, ED (MC, WL, AP ONLY): I-stat hCG, quantitative: <5 m[IU]/mL (ref <5)

## 2022-07-23 MED ORDER — HYDRALAZINE HCL 25 MG PO TABS
50.0000 mg | ORAL_TABLET | Freq: Once | ORAL | Status: AC
  Administered 2022-07-23: 50 mg via ORAL
  Filled 2022-07-23: qty 2

## 2022-07-23 MED ORDER — SODIUM CHLORIDE 0.9 % IV SOLN
10.0000 mL/h | Freq: Once | INTRAVENOUS | Status: AC
  Administered 2022-07-23: 10 mL/h via INTRAVENOUS

## 2022-07-23 NOTE — ED Provider Notes (Signed)
[facility] DEPT Provider Note   CSN: [id number] Arrival date & time: 07/23/22  1837     History {Add pertinent medical, surgical, social history, OB history to HPI:1} Chief Complaint  Patient presents with   Abnormal Lab    Sherri Dixon is a 41 y.o. female.  41 year old female presents due to low hemoglobin.  Patient saw her physician recently and was noted to have hemoglobin less than 7.  Patient has history of same due to to heavy periods.  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  Notes no vaginal bleeding at this time.  Denies any black or bloody stools.  Hematemesis noted.  Has had some increased weakness as well as dyspnea exertion.       Home Medications Prior to Admission medications   Medication Sig Start Date End Date Taking? Authorizing Provider  Alcohol Swabs 70 % PADS 1 application by Does not apply route 3 (three) times daily before meals. 06/06/16   Dhungel, Nishant, MD  AZO D-MANNOSE 500 MG CAPS Take 1 capsule by mouth daily.    [provider]  dapagliflozin propanediol (FARXIGA) 10 MG TABS tablet Take 10 mg by mouth daily.    [provider]  furosemide (LASIX) 20 MG tablet Take 1 tablet (20 mg total) by mouth 2 (two) times daily. 04/25/21   Love, Ivan Anchors, PA-C  gabapentin (NEURONTIN) 300 MG capsule Take 1 capsule (300 mg total) by mouth daily as needed (pain). 06/27/21   Raulkar, Clide Deutscher, MD  glucose blood test strip Use as instructed 06/06/16   Dhungel, Nishant, MD  hydrALAZINE (APRESOLINE) 50 MG tablet Take 1 tablet (50 mg total) by mouth 3 (three) times daily. 12/15/20   Bonnielee Haff, MD  insulin aspart (NOVOLOG FLEXPEN) 100 UNIT/ML FlexPen Inject 0-20 Units as directed 3 (three) times daily. Sliding scale 01/03/19   [provider]  Insulin Syringe-Needle U-100 (INSULIN SYRINGE 1CC/28G) 28G X 1/2" 1 ML MISC 1 application by Does not apply route 3 (three) times daily before meals. 06/06/16    Dhungel, Flonnie Overman, MD  metoprolol succinate (TOPROL-XL) 100 MG 24 hr tablet Take 1 tablet (100 mg total) by mouth daily. 04/25/21   Love, Ivan Anchors, PA-C  mineral oil-hydrophilic petrolatum (AQUAPHOR) ointment Apply 1 application topically as needed (eczema).    [provider]  rosuvastatin (CRESTOR) 40 MG tablet Take 1 tablet (40 mg total) by mouth daily. 01/29/21   Thurnell Lose, MD  VISINE-AC 0.05-0.25 % ophthalmic solution Place 2 drops into both eyes 3 (three) times daily as needed (for irritation).    [provider]  Vitamin D, Ergocalciferol, (DRISDOL) 1.25 MG (50000 UNIT) CAPS capsule Take 50,000 Units by mouth once a week. 03/19/21   [provider]  XARELTO 20 MG TABS tablet Take 20 mg by mouth daily. 10/15/20   [provider]      Allergies    Amlodipine, Humalog kwikpen [insulin lispro], and Insulin glargine    Review of Systems   Review of Systems  All other systems reviewed and are negative.   Physical Exam Updated Vital Signs BP (!) 181/101   Pulse 64   Temp 98.3 F (36.8 C) (Oral)   Resp 18   Ht 1.651 m ('5\' 5"'$ )   Wt 117.9 kg   SpO2 99%   BMI 43.27 kg/m  Physical Exam Vitals and nursing note reviewed.  Constitutional:      General: She is not in acute distress.  Appearance: Normal appearance. She is well-developed. She is not toxic-appearing.  HENT:     Head: Normocephalic and atraumatic.  Eyes:     General: Lids are normal.     Conjunctiva/sclera: Conjunctivae normal.     Pupils: Pupils are equal, round, and reactive to light.  Neck:     Thyroid: No thyroid mass.     Trachea: No tracheal deviation.  Cardiovascular:     Rate and Rhythm: Normal rate and regular rhythm.     Heart sounds: Normal heart sounds. No murmur heard.    No gallop.  Pulmonary:     Effort: Pulmonary effort is normal. No respiratory distress.     Breath sounds: Normal breath sounds. No stridor. No decreased breath sounds, wheezing, rhonchi or  rales.  Abdominal:     General: There is no distension.     Palpations: Abdomen is soft.     Tenderness: There is no abdominal tenderness. There is no rebound.  Musculoskeletal:        General: No tenderness. Normal range of motion.     Cervical back: Normal range of motion and neck supple.  Skin:    General: Skin is warm and dry.     Coloration: Skin is pale.     Findings: No abrasion or rash.  Neurological:     Mental Status: She is alert and oriented to person, place, and time. Mental status is at baseline.     GCS: GCS eye subscore is 4. GCS verbal subscore is 5. GCS motor subscore is 6.     Cranial Nerves: No cranial nerve deficit.     Sensory: No sensory deficit.     Motor: Motor function is intact.  Psychiatric:        Attention and Perception: Attention normal.        Speech: Speech normal.        Behavior: Behavior normal.     ED Results / Procedures / Treatments   Labs (all labs ordered are listed, but only abnormal results are displayed) Labs Reviewed  COMPREHENSIVE METABOLIC PANEL  CBC WITH DIFFERENTIAL/PLATELET  I-STAT BETA HCG BLOOD, ED ([facility], [facility], [facility] ONLY)  TYPE AND SCREEN  PREPARE RBC (CROSSMATCH)    EKG None  Radiology No results found.  Procedures Procedures  {Document cardiac monitor, telemetry assessment procedure when appropriate:1}  Medications Ordered in ED Medications  0.9 %  sodium chloride infusion (has no administration in time range)  hydrALAZINE (APRESOLINE) tablet 50 mg (50 mg Oral Given 07/23/22 1916)    ED Course/ Medical Decision Making/ A&P                           Medical Decision Making Amount and/or Complexity of Data Reviewed Labs: ordered.  Risk Prescription drug management.   ***  {Document critical care time when appropriate:1} {Document review of labs and clinical decision tools ie heart score, Chads2Vasc2 etc:1}  {Document your independent review of radiology images, and any outside records:1} {Document your  discussion with family members, caretakers, and with consultants:1} {Document social determinants of health affecting pt's care:1} {Document your decision making why or why not admission, treatments were needed:1} Final Clinical Impression(s) / ED Diagnoses Final diagnoses:  None    Rx / DC Orders ED Discharge Orders     None

## 2022-07-23 NOTE — ED Provider Notes (Signed)
  Provider Note MRN:  [id number]  Arrival date & time: 07/23/22    ED Course and Medical Decision Making  Assumed care from Dr. Zenia Resides at shift change.  History of fibroids here for 1 unit of blood, discharge afterwards.  11:56 PM update: Patient has completed her blood transfusion, feels well, normal vital signs, no signs of transfusion reaction, appropriate for discharge.  .Critical Care  Performed by: Maudie Flakes, MD Authorized by: Maudie Flakes, MD   Critical care provider statement:    Critical care time (minutes):  32   Critical care was necessary to treat or prevent imminent or life-threatening deterioration of the following conditions: Symptomatic anemia.   Critical care was time spent personally by me on the following activities:  Development of treatment plan with patient or surrogate, discussions with consultants, evaluation of patient's response to treatment, examination of patient, ordering and review of laboratory studies, ordering and review of radiographic studies, ordering and performing treatments and interventions, pulse oximetry, re-evaluation of patient's condition and review of old charts   Final Clinical Impressions(s) / ED Diagnoses     ICD-10-CM   1. Symptomatic anemia  D64.9       ED Discharge Orders     None         Discharge Instructions      You were evaluated in the Emergency Department and after careful evaluation, we did not find any emergent condition requiring admission or further testing in the hospital.  Your exam/testing today was overall reassuring.  We provided a blood transfusion here in the emergency department.  Important that you continue following up with your regular doctors to treat your anemia.  Please return to the Emergency Department if you experience any worsening of your condition.  Thank you for allowing [place] to be a part of your care.       Barth Kirks. Sedonia Small, [place] [email or web address]    Maudie Flakes, MD 07/23/22 [phone]

## 2022-07-23 NOTE — Discharge Instructions (Signed)
You were evaluated in the Emergency Department and after careful evaluation, we did not find any emergent condition requiring admission or further testing in the hospital.  Your exam/testing today was overall reassuring.  We provided a blood transfusion here in the emergency department.  Important that you continue following up with your regular doctors to treat your anemia.  Please return to the Emergency Department if you experience any worsening of your condition.  Thank you for allowing [place] to be a part of your care.

## 2022-07-23 NOTE — ED Provider Triage Note (Signed)
Emergency Medicine Provider Triage Evaluation Note  Sherri Dixon , a 41 y.o. female  was evaluated in triage.  Pt complains of LOW HGB. + sxs of symptamit anemia Hx of recurrent transfusion. On Xarelto for PE- heavy menstrual cycle  Review of Systems  Positive: Fatigue, near syncope  Negative: Fever   Physical Exam  BP (!) 234/97 (BP Location: Left Arm)   Pulse 64   Temp 98.3 F (36.8 C) (Oral)   Resp 18   Ht '5\' 5"'$  (1.651 m)   Wt 117.9 kg   SpO2 99%   BMI 43.27 kg/m  Gen:   Awake, no distress   Resp:  Normal effort  MSK:   Moves extremities without difficulty  Other:  Pale   Medical Decision Making  Medically screening exam initiated at 6:55 PM.  Appropriate orders placed.  Deliyah Muckle was informed that the remainder of the evaluation will be completed by another provider, this initial triage assessment does not replace that evaluation, and the importance of remaining in the ED until their evaluation is complete.  Work up initiated.   Margarita Mail, PA-C 07/23/22 1857

## 2022-07-23 NOTE — ED Triage Notes (Signed)
Pt arrived via [facility], told blood work today showed hemoglobin of 6.0, told to come to ED for blood transfusion.

## 2022-07-24 LAB — BPAM RBC
Blood Product Expiration Date: 202311072359
ISSUE DATE / TIME: 202310252119
Unit Type and Rh: 6200

## 2022-07-24 LAB — TYPE AND SCREEN
ABO/RH(D): A POS
Antibody Screen: NEGATIVE
Unit division: 0

## 2022-07-24 NOTE — ED Notes (Signed)
Patient verbalizes understanding of discharge instructions. Opportunity for questioning and answers were provided. Armband removed by staff, pt discharged from ED. Wheeled out to lobby, picked up by husband

## 2022-08-15 ENCOUNTER — Encounter: Payer: 59 | Attending: Physical Medicine and Rehabilitation | Admitting: Physical Medicine and Rehabilitation

## 2022-08-15 ENCOUNTER — Encounter: Payer: Self-pay | Admitting: Physical Medicine and Rehabilitation

## 2022-08-15 VITALS — BP 168/98 | HR 76 | Ht 65.0 in | Wt 255.0 lb

## 2022-08-15 DIAGNOSIS — G6289 Other specified polyneuropathies: Secondary | ICD-10-CM | POA: Diagnosis not present

## 2022-08-15 DIAGNOSIS — Z794 Long term (current) use of insulin: Secondary | ICD-10-CM | POA: Diagnosis not present

## 2022-08-15 DIAGNOSIS — E1165 Type 2 diabetes mellitus with hyperglycemia: Secondary | ICD-10-CM | POA: Diagnosis present

## 2022-08-15 DIAGNOSIS — R609 Edema, unspecified: Secondary | ICD-10-CM | POA: Diagnosis not present

## 2022-08-15 NOTE — Progress Notes (Signed)
Subjective:    Patient ID: Sherri Dixon, female    DOB: November 16, 1980, 41 y.o.   MRN: [id number]  HPI Sherri Dixon is a 41 year old woman who presents for for follow-up after CIR admission for right transmetatarsal amputation.   -Sherri Dixon has not been having an appetite.  -no falls.  -has to hold onto her husband to walk -sometimes she tries to walk in stores but grocery stores  -has seated walker -neuropathy in feet is better -every blue moon she has burning in left foot.  -She has been having diarrhea.  -She asks whether her antibiotic can be changed as the pill is big for her to swallow and she has thrown it up- she says she has not been taking it. I discussed with pharmacy and we are able to switch her medication to liquid Bactrim -Last week when I called her she was in the hospital for secondary closure of her wound -She has a follow-up with the surgeon tomorrow- she thinks wound vac will be removed tomorrow.  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  -she tries not to take the Gabapentin much because it helps her sleepy. -She has felt so much better Activia ans sea moss   Pain Inventory Average Pain 3 Pain Right Now 3 My pain is intermitted: aching in both ankles  In the last 24 hours, has pain interfered with the following? General activity 0 Relation with others 0 Enjoyment of life 0 What TIME of day is your pain at its worst? morning Sleep (in general) fair  Pain is worse with: walking Pain improves with: elevating leg Relief from Meds: tylenol helps sometimes     Review of Systems  Constitutional:  Positive for appetite change.       Decreased  HENT: Negative.    Eyes: Negative.   Respiratory: Negative.    Cardiovascular:  Positive for leg swelling.       Both ankle swollen  Gastrointestinal:  Positive for diarrhea, nausea and vomiting.  Endocrine:       High blood sugars, has novolog but cannot  get her basaglar due to finances  Genitourinary: Negative.   Musculoskeletal:  Positive for gait problem.  Skin:  Positive for wound.       Vac right foot  Allergic/Immunologic: Negative.   Neurological:  Positive for weakness.  Hematological:  Bruises/bleeds easily.       Xarelto  Psychiatric/Behavioral:  Positive for dysphoric mood.   All other systems reviewed and are negative.      Family History  Problem Relation Age of Onset   High blood pressure Mother     Diabetes Mother     Arthritis Mother     Hyperlipidemia Mother     High blood pressure Father     Hyperlipidemia Father     High blood pressure Sister     Diabetes Brother     Hyperlipidemia Brother     High blood pressure Brother      Social History         Socioeconomic History   Marital status: Married      Spouse name: Not on file   Number of children: Not on file   Years of education: Not on file   Highest education level: Not on file  Occupational History   Not on file  Tobacco Use   Smoking status: Never   Smokeless tobacco: Never  Vaping Use   Vaping Use: Never used  Substance and Sexual Activity   Alcohol use: Yes      Alcohol/week: 1.0 standard drink      Types: 1 Glasses of wine per week      Comment: occas   Drug use: No   Sexual activity: Yes      Birth control/protection: None  Other Topics Concern   Not on file  Social History Narrative   Not on file    Social Determinants of Health    Financial Resource Strain: Not on file  Food Insecurity: Not on file  Transportation Needs: Not on file  Physical Activity: Not on file  Stress: Not on file  Social Connections: Not on file         Past Surgical History:  Procedure Laterality Date   APPLICATION OF WOUND VAC Right 04/12/2021    Procedure: APPLICATION OF WOUND VAC;  Surgeon: Erle Crocker, MD;  Location: [facility];  Service: Orthopedics;  Laterality: Right;   I & D EXTREMITY Right 04/06/2021    Procedure: IRRIGATION AND  DEBRIDEMENT TOE WITH WOUND VAC PLACEMENT;  Surgeon: Erle Crocker, MD;  Location: [facility];  Service: Orthopedics;  Laterality: Right;   I & D EXTREMITY Right 04/12/2021    Procedure: IRRIGATION AND DEBRIDEMENT RIGHT FOOT;  Surgeon: Erle Crocker, MD;  Location: [facility];  Service: Orthopedics;  Laterality: Right;   MOUTH SURGERY       ROBOTIC ASSITED PARTIAL NEPHRECTOMY Right 07/28/2019    Procedure: XI ROBOTIC ASSITED PARTIAL NEPHRECTOMY;  Surgeon: Cleon Gustin, MD;  Location: [facility];  Service: Urology;  Laterality: Right;  3 HRS   SECONDARY CLOSURE OF WOUND Right 06/18/2021    Procedure: SECONDARY CLOSURE OF WOUND tRANSMETATARSAL AMPUTAION;  Surgeon: Erle Crocker, MD;  Location: [facility];  Service: Orthopedics;  Laterality: Right;   TRANSMETATARSAL AMPUTATION Right 04/12/2021    Procedure: TRANSMETATARSAL AMPUTATION;  Surgeon: Erle Crocker, MD;  Location: [facility];  Service: Orthopedics;  Laterality: Right;        Past Medical History:  Diagnosis Date   Anemia     Asthma     CHF (congestive heart failure) (HCC)      Pt reports that father has CHF, not her   Diabetes mellitus without complication (HCC)     Dyslipidemia     GERD (gastroesophageal reflux disease)     Pulmonary embolism (HCC)     Stroke (HCC)      BP (!) 179/93   Pulse 66   Temp 97.7 F (36.5 C)   Ht '5\' 2"'$  (1.575 m)   Wt 242 lb 3.2 oz (109.9 kg)   SpO2 96%   BMI 44.30 kg/m    Opioid Risk Score:   Fall Risk Score:  `1   Depression screen PHQ 2/9   Depression screen PHQ 2/9 06/27/2021  Decreased Interest 2  Down, Depressed, Hopeless 1  PHQ - 2 Score 3  Altered sleeping 2  Tired, decreased energy 3  Change in appetite 3  Feeling bad or failure about yourself  2  Trouble concentrating 1  Moving slowly or fidgety/restless 2  Suicidal thoughts 0  PHQ-9 Score 16  Difficult doing work/chores Very difficult    Objective:   Physical Exam Gen: no distress, normal appearing HEENT: oral  mucosa pink and moist, NCAT Cardio: Reg rate Chest: normal effort, normal rate of breathing Abd: soft, non-distended Ext: no edema Psych: pleasant, normal affect Skin:  intact Neuro: Alert and oriented x3 MSK: ambulating normally    Assessment & Plan:  Sherri Dixon is a 41 year old woman who presents for f/u after CIR admission for transmetatarsal amputation  1) Decreased appetite/impaired ability to swallow antibiotic -may benefit from SLP, provided referral -discussed with her the importance of taking her antibiotic as prescribed. Discussed to call surgeon and discuss alternative antibiotics if she cannot swallow current Bactrim  2) Transmetatarsal amputation -denies pain -wound vac in place- may be removed at follow-up tomorrow -patient has not been taking antibiotic as has not been able to tolerate swallowing it. Discussed with our inpatient pharmacy who helped review patient's chart and confirmed that we can switch to liquid Bactrim  3) decreased upper extremity strength/difficulty with transfers -referred to occupational therapy -recommend bone broth to help increase energy and muscle strength- provided resources for grass-fed bone broth -Discussed leaky gut and how it can lead to pain, inflammation, obesity, and chronic disease -Discussed foods that support the microbiome:   1) carrots  2) onions  3) radishes  4) tomatoes  5) turmeric  6) jicama  7) garlic   4) Nausea -continue yogurt which is helping  5) 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 Discussed that this is a capsaicin patch, stronger than capsaicin cream. Discussed that it is currently approved for diabetic peripheral neuropathy and post-herpetic neuralgia, but that it has also shown benefit in treating other forms of neuropathy. Provided patient with link to site to learn more about  the patch: [employer]. Discussed that the patch would be placed in office and benefits usually last 3 months. Discussed that unintended exposure to capsaicin can cause severe irritation of eyes, mucous membranes, respiratory tract, and skin, but that Qutenza is a local treatment and does not have the systemic side effects of other nerve medications. Discussed that there may be pain, itching, erythema, and decreased sensory function associated with the application of Qutenza. Side effects usually subside within 1 week. A cold pack of analgesic medications can help with these side effects. Blood pressure can also be increased due to pain associated with administration of the patch. Marland Kitchen   6) Diabetes: -check CBGs daily, log, and bring log to follow-up appointment -Discussed the benefits of intermittent fasting. Recommended starting with pushing dinner 15 minutes earlier and when this feels easy, continuing to push dinner 15 minutes earlier. Discussed that this can help her body to improve its ability to burn fat rather than glucose, improving insulin sensitivity. Recommended drinking [place] tea in the evening to help curb appetite and for its numerous health benefits.   -avoid sugar, bread, pasta, rice -avoid snacking -perform daily foot exam and at least annual eye exam -try to incorporate into your diet some of the following foods which are good for diabetes: 1) cinnamon- imitates effects of insulin, increasing glucose transport into cells ([place] or [place] cinnamon is best, least processed) 2) nuts- can slow down the blood sugar response of carbohydrate rich foods 3) oatmeal- contains and anti-inflammatory compound avenanthramide 4) whole-milk yogurt (best types are no sugar, [place] yogurt, or goat/sheep yogurt) 5) beans- high in protein, fiber, and vitamins, low glycemic index 6) broccoli- great source of vitamin A and C 7) quinoa- higher in protein and fiber than other grains 8)  spinach- high in vitamin A, fiber, and protein 9) olive oil- reduces glucose levels, LDL, and triglycerides 10)  salmon- excellent amount of omega-3-fatty acids 11) walnuts- rich in antioxidants 12) apples- high in fiber and quercetin 13) carrots- highly nutritious with low impact on blood sugar 14) eggs- improve HDL (good cholesterol), high in protein, keep you satiated 15) turmeric: improves blood sugars, cardiovascular disease, and protects kidney health 16) garlic: improves blood sugar, blood pressure, pain 17) tomatoes: highly nutritious with low impact on blood sugar

## 2022-08-15 NOTE — Patient Instructions (Addendum)
  Magnesium Bioptemizer's Magnesium glycinate '400mg'$    [place]  Diabetes: -check CBGs daily, log, and bring log to follow-up appointment -avoid sugar, bread, pasta, rice -avoid snacking -perform daily foot exam and at least annual eye exam -try to incorporate into your diet some of the following foods which are good for diabetes: 1) cinnamon- imitates effects of insulin, increasing glucose transport into cells ([place] or [place] cinnamon is best, least processed) 2) nuts- can slow down the blood sugar response of carbohydrate rich foods 3) oatmeal- contains and anti-inflammatory compound avenanthramide 4) whole-milk yogurt (best types are no sugar, [place] yogurt, or goat/sheep yogurt) 5) beans- high in protein, fiber, and vitamins, low glycemic index 6) broccoli- great source of vitamin A and C 7) quinoa- higher in protein and fiber than other grains 8) spinach- high in vitamin A, fiber, and protein 9) olive oil- reduces glucose levels, LDL, and triglycerides 10) salmon- excellent amount of omega-3-fatty acids 11) walnuts- rich in antioxidants 12) apples- high in fiber and quercetin 13) carrots- highly nutritious with low impact on blood sugar 14) eggs- improve HDL (good cholesterol), high in protein, keep you satiated 15) turmeric: improves blood sugars, cardiovascular disease, and protects kidney health 16) garlic: improves blood sugar, blood pressure, pain 17) tomatoes: highly nutritious with low impact on blood sugar

## 2022-08-28 ENCOUNTER — Ambulatory Visit: Payer: 59 | Admitting: Physical Medicine and Rehabilitation

## 2022-11-13 ENCOUNTER — Encounter: Payer: 59 | Attending: Physical Medicine and Rehabilitation | Admitting: Physical Medicine and Rehabilitation

## 2022-11-13 ENCOUNTER — Encounter: Payer: Self-pay | Admitting: Physical Medicine and Rehabilitation

## 2022-11-13 VITALS — BP 165/103 | HR 75 | Ht 65.0 in | Wt 243.0 lb

## 2022-11-13 DIAGNOSIS — Z89431 Acquired absence of right foot: Secondary | ICD-10-CM | POA: Diagnosis present

## 2022-11-13 DIAGNOSIS — G6289 Other specified polyneuropathies: Secondary | ICD-10-CM | POA: Diagnosis present

## 2022-11-13 DIAGNOSIS — I10 Essential (primary) hypertension: Secondary | ICD-10-CM | POA: Insufficient documentation

## 2022-11-13 DIAGNOSIS — E1165 Type 2 diabetes mellitus with hyperglycemia: Secondary | ICD-10-CM | POA: Diagnosis present

## 2022-11-13 DIAGNOSIS — Z794 Long term (current) use of insulin: Secondary | ICD-10-CM | POA: Diagnosis present

## 2022-11-13 NOTE — Progress Notes (Addendum)
Subjective:    Patient ID: Sherri Dixon, female    DOB: May 30, 1981, 42 y.o.   MRN: [id number]  HPI Sherri Dixon is a 42 year old woman who presents for for follow-up of right transmetatarsal amputation, HTN, type 2 diabetes   1) HTN -hasn't checked in a couple days  2) Type 2 diabetes -up to 61 -drinking bone broth  3) Blister on right foot -seeing PCP tomorrow -starting amoxicillin for her tooth -no signs of infection of the foot now -foot was infected when the blister bust.  -her shoes rubs   -Sherri Dixon has not been having an appetite.  -no falls.  -has to hold onto her husband to walk -sometimes she tries to walk in stores but grocery stores  -has seated walker -neuropathy in feet is better -every blue moon she has burning in left foot.  -She has been having diarrhea.  -She asks whether her antibiotic can be changed as the pill is big for her to swallow and she has thrown it up- she says she has not been taking it. I discussed with pharmacy and we are able to switch her medication to liquid Bactrim -Last week when I called her she was in the hospital for secondary closure of her wound -She has a follow-up with the surgeon tomorrow- she thinks wound vac will be removed tomorrow.  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  -she tries not to take the Gabapentin much because it helps her sleepy. -She has felt so much better Activia ans sea moss    Pain Inventory Average Pain 0 Pain Right Now 0 My pain is intermitted: aching in both ankles, feet  In the last 24 hours, has pain interfered with the following? General activity 0 Relation with others 0 Enjoyment of life 7 What TIME of day is your pain at its worst?night Sleep (in general) fair  Pain is worse with: no pain Pain improves with: elevating leg when in pain Relief from Meds: tylenol helps sometimes     Review of Systems  Constitutional:   Positive for appetite change.       Decreased  HENT: Negative.    Eyes: Negative.   Respiratory: Negative.    Cardiovascular:  Positive for leg swelling.       Both ankle swollen  Gastrointestinal:  Positive for diarrhea, nausea and vomiting.  Endocrine:       High blood sugars, has novolog but cannot get her basaglar due to finances  Genitourinary: Negative.   Musculoskeletal:  Positive for gait problem.  Skin:  Positive for wound.       Vac right foot  Allergic/Immunologic: Negative.   Neurological:  Positive for weakness.  Hematological:  Bruises/bleeds easily.       Xarelto  Psychiatric/Behavioral:  Positive for dysphoric mood.   All other systems reviewed and are negative.      Family History  Problem Relation Age of Onset   High blood pressure Mother     Diabetes Mother     Arthritis Mother     Hyperlipidemia Mother     High blood pressure Father     Hyperlipidemia Father     High blood pressure Sister     Diabetes Brother     Hyperlipidemia Brother     High blood pressure Brother      Social History         Socioeconomic History  Marital status: Married      Spouse name: Not on file   Number of children: Not on file   Years of education: Not on file   Highest education level: Not on file  Occupational History   Not on file  Tobacco Use   Smoking status: Never   Smokeless tobacco: Never  Vaping Use   Vaping Use: Never used  Substance and Sexual Activity   Alcohol use: Yes      Alcohol/week: 1.0 standard drink      Types: 1 Glasses of wine per week      Comment: occas   Drug use: No   Sexual activity: Yes      Birth control/protection: None  Other Topics Concern   Not on file  Social History Narrative   Not on file    Social Determinants of Health    Financial Resource Strain: Not on file  Food Insecurity: Not on file  Transportation Needs: Not on file  Physical Activity: Not on file  Stress: Not on file  Social Connections: Not on file          Past Surgical History:  Procedure Laterality Date   APPLICATION OF WOUND VAC Right 04/12/2021    Procedure: APPLICATION OF WOUND VAC;  Surgeon: Erle Crocker, MD;  Location: [facility];  Service: Orthopedics;  Laterality: Right;   I & D EXTREMITY Right 04/06/2021    Procedure: IRRIGATION AND DEBRIDEMENT TOE WITH WOUND VAC PLACEMENT;  Surgeon: Erle Crocker, MD;  Location: [facility];  Service: Orthopedics;  Laterality: Right;   I & D EXTREMITY Right 04/12/2021    Procedure: IRRIGATION AND DEBRIDEMENT RIGHT FOOT;  Surgeon: Erle Crocker, MD;  Location: [facility];  Service: Orthopedics;  Laterality: Right;   MOUTH SURGERY       ROBOTIC ASSITED PARTIAL NEPHRECTOMY Right 07/28/2019    Procedure: XI ROBOTIC ASSITED PARTIAL NEPHRECTOMY;  Surgeon: Cleon Gustin, MD;  Location: [facility];  Service: Urology;  Laterality: Right;  3 HRS   SECONDARY CLOSURE OF WOUND Right 06/18/2021    Procedure: SECONDARY CLOSURE OF WOUND tRANSMETATARSAL AMPUTAION;  Surgeon: Erle Crocker, MD;  Location: [facility];  Service: Orthopedics;  Laterality: Right;   TRANSMETATARSAL AMPUTATION Right 04/12/2021    Procedure: TRANSMETATARSAL AMPUTATION;  Surgeon: Erle Crocker, MD;  Location: [facility];  Service: Orthopedics;  Laterality: Right;        Past Medical History:  Diagnosis Date   Anemia     Asthma     CHF (congestive heart failure) (HCC)      Pt reports that father has CHF, not her   Diabetes mellitus without complication (HCC)     Dyslipidemia     GERD (gastroesophageal reflux disease)     Pulmonary embolism (HCC)     Stroke (HCC)      BP (!) 179/93   Pulse 66   Temp 97.7 F (36.5 C)   Ht 5' 2"$  (1.575 m)   Wt 242 lb 3.2 oz (109.9 kg)   SpO2 96%   BMI 44.30 kg/m    Opioid Risk Score:   Fall Risk Score:  `1   Depression screen PHQ 2/9   Depression screen PHQ 2/9 06/27/2021  Decreased Interest 2  Down, Depressed, Hopeless 1  PHQ - 2 Score 3  Altered sleeping 2  Tired, decreased  energy 3  Change in appetite 3  Feeling bad or failure about yourself  2  Trouble concentrating 1  Moving  slowly or fidgety/restless 2  Suicidal thoughts 0  PHQ-9 Score 16  Difficult doing work/chores Very difficult    Objective:   Physical Exam Gen: no distress, normal appearing, 243 lbs, BMI 40.44, 165/103 HEENT: oral mucosa pink and moist, NCAT Cardio: Reg rate Chest: normal effort, normal rate of breathing Abd: soft, non-distended Ext: no edema Psych: pleasant, normal affect Skin: intact Neuro: Alert and oriented x3 MSK: ambulating normally    Assessment & Plan:  Sherri Dixon is a 42 year old woman who presents for f/u after CIR admission for transmetatarsal amputation, HTN, type 2 diabetes.   1) Decreased appetite/impaired ability to swallow antibiotic -may benefit from SLP, provided referral -discussed with her the importance of taking her antibiotic as prescribed. Discussed to call surgeon and discuss alternative antibiotics if she cannot swallow current Bactrim  2) Transmetatarsal amputation -denies pain -wound vac in place- may be removed at follow-up tomorrow -patient has not been taking antibiotic as has not been able to tolerate swallowing it. Discussed with our inpatient pharmacy who helped review patient's chart and confirmed that we can switch to liquid Bactrim  3) decreased upper extremity strength/difficulty with transfers -referred to occupational therapy -recommend bone broth to help increase energy and muscle strength- provided resources for grass-fed bone broth -Discussed leaky gut and how it can lead to pain, inflammation, obesity, and chronic disease -Discussed foods that support the microbiome:   1) carrots  2) onions  3) radishes  4) tomatoes  5) turmeric  6) jicama  7) garlic   4) Nausea -continue yogurt which is helping  5) 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 Discussed that this is a capsaicin patch, stronger than capsaicin cream. Discussed that it is currently approved for diabetic peripheral neuropathy and post-herpetic neuralgia, but that it has also shown benefit in treating other forms of neuropathy. Provided patient with link to site to learn more about the patch: [employer].fr. Discussed that the patch would be placed in office and benefits usually last 3 months. Discussed that unintended exposure to capsaicin can cause severe irritation of eyes, mucous membranes, respiratory tract, and skin, but that Qutenza is a local treatment and does not have the systemic side effects of other nerve medications. Discussed that there may be pain, itching, erythema, and decreased sensory function associated with the application of Qutenza. Side effects usually subside within 1 week. A cold pack of analgesic medications can help with these side effects. Blood pressure can also be increased due to pain associated with administration of the patch.   6) Diabetes: -check CBGs daily, log, and bring log to follow-up appointment -continue insulin, decrease to no more than 7U to avoid hypoglycemia.  -Discussed the benefits of intermittent fasting. Recommended starting with pushing dinner 15 minutes earlier and when this feels easy, continuing to push dinner 15 minutes earlier. Discussed that this can help her body to improve its ability to burn fat rather than glucose, improving insulin sensitivity. Recommended drinking [place] tea in the evening to help curb appetite and for its numerous health benefits.   -avoid sugar, bread, pasta, rice -avoid snacking -perform daily foot exam and at least annual eye exam -try to incorporate into your diet some of the following foods which are good for diabetes: 1) cinnamon- imitates effects of insulin, increasing glucose transport into cells ([place] or [place]  cinnamon is best, least processed) 2) nuts-  can slow down the blood sugar response of carbohydrate rich foods 3) oatmeal- contains and anti-inflammatory compound avenanthramide 4) whole-milk yogurt (best types are no sugar, [place] yogurt, or goat/sheep yogurt) 5) beans- high in protein, fiber, and vitamins, low glycemic index 6) broccoli- great source of vitamin A and C 7) quinoa- higher in protein and fiber than other grains 8) spinach- high in vitamin A, fiber, and protein 9) olive oil- reduces glucose levels, LDL, and triglycerides 10) salmon- excellent amount of omega-3-fatty acids 11) walnuts- rich in antioxidants 12) apples- high in fiber and quercetin 13) carrots- highly nutritious with low impact on blood sugar 14) eggs- improve HDL (good cholesterol), high in protein, keep you satiated 15) turmeric: improves blood sugars, cardiovascular disease, and protects kidney health 16) garlic: improves blood sugar, blood pressure, pain 17) tomatoes: highly nutritious with low impact on blood sugar

## 2022-11-13 NOTE — Patient Instructions (Addendum)
Growing Years whole milk DHA EPA   Berberine- blood sugars, weight loss  Cinnamon, ceylon  Green tea

## 2023-02-16 ENCOUNTER — Encounter: Payer: 59 | Attending: Physical Medicine and Rehabilitation | Admitting: Physical Medicine and Rehabilitation

## 2023-02-16 DIAGNOSIS — E1165 Type 2 diabetes mellitus with hyperglycemia: Secondary | ICD-10-CM | POA: Insufficient documentation

## 2023-02-16 DIAGNOSIS — Z89431 Acquired absence of right foot: Secondary | ICD-10-CM | POA: Insufficient documentation

## 2023-02-16 DIAGNOSIS — Z794 Long term (current) use of insulin: Secondary | ICD-10-CM | POA: Insufficient documentation

## 2023-02-16 DIAGNOSIS — I10 Essential (primary) hypertension: Secondary | ICD-10-CM | POA: Insufficient documentation

## 2023-02-16 DIAGNOSIS — G6289 Other specified polyneuropathies: Secondary | ICD-10-CM | POA: Insufficient documentation

## 2023-02-17 ENCOUNTER — Institutional Professional Consult (permissible substitution): Payer: 59 | Admitting: Nurse Practitioner

## 2023-02-18 ENCOUNTER — Institutional Professional Consult (permissible substitution): Payer: 59 | Admitting: Nurse Practitioner

## 2023-03-10 ENCOUNTER — Ambulatory Visit (INDEPENDENT_AMBULATORY_CARE_PROVIDER_SITE_OTHER): Payer: 59 | Admitting: Primary Care

## 2023-03-10 ENCOUNTER — Encounter: Payer: Self-pay | Admitting: Primary Care

## 2023-03-10 VITALS — BP 112/68 | HR 82 | Temp 97.4°F | Ht 64.0 in | Wt 240.0 lb

## 2023-03-10 DIAGNOSIS — R0683 Snoring: Secondary | ICD-10-CM | POA: Diagnosis not present

## 2023-03-10 DIAGNOSIS — I509 Heart failure, unspecified: Secondary | ICD-10-CM | POA: Diagnosis not present

## 2023-03-10 DIAGNOSIS — R29818 Other symptoms and signs involving the nervous system: Secondary | ICD-10-CM | POA: Diagnosis not present

## 2023-03-10 MED ORDER — OMEPRAZOLE 20 MG PO CPDR: 20.0000 mg | DELAYED_RELEASE_CAPSULE | Freq: Two times a day (BID) | ORAL | 1 refills | Status: DC

## 2023-03-10 NOTE — Progress Notes (Signed)
@Patient  ID: Sherri Dixon, female    DOB: October 04, 1980, 42 y.o.   MRN: [id number]  Chief Complaint  Patient presents with   Consult    Epworth 13    Referring provider: Jettie Pagan, NP  HPI: 42 year old female, never smoked.  Past medical history significant for hypertension, pulmonary embolism, CVA, heart failure, type 2 diabetes, GERD, acute kidney injury, Mea, vitamin D deficiency, partial amputation right foot, obesity.  03/10/2023 Presents today for sleep consult. She has symptoms of snoring and restless sleep. She typically sleeps on the couch and falls asleep in front of the TV. She has a lot of heart burn symptoms at night. She has been taking prilosec OTC 20mg  a bedtime without much improvement.   Bedtime is between 1-2am. She wakes up 1-2 times a night. She starts her day between 7 to 8 AM.  No previous sleep studies.  No concern for narcolepsy, cataplexy or sleepwalking. Epworth score 13.   Sleep questionnaire Symptoms-   Snoring, not sleeping long, restless sleep  Prior sleep study- none Bedtime- 1-2am Time to fall asleep- can take several hours  Nocturnal awakenings- 1-2 times  Out of bed/start of day- 7-8am  Weight changes- unsure Do you operate heavy machinery- no Do you currently wear CPAP- no Do you current wear oxygen- no Epworth- 13  Allergies  Allergen Reactions   Amlodipine Swelling    Pelvis swells   Insulin Glargine Other (See Comments) and Hives   Humalog Kwikpen [Insulin Lispro] Other (See Comments)    75/50---swelling and ulcers in mouth     Immunization History  Administered Date(s) Administered   Influenza Inj Mdck Quad Pf 07/22/2021, 08/07/2022   Influenza,inj,Quad PF,6+ Mos 07/12/2018, 06/17/2019, 07/11/2020   Influenza-Unspecified 07/12/2018, 06/17/2019, 07/11/2020   Moderna Sars-Covid-2 Vaccination 05/23/2020, 06/20/2020   PNEUMOCOCCAL CONJUGATE-20 04/09/2022   Pneumococcal Polysaccharide-23 11/29/2018   Tdap 08/10/2019    Past  Medical History:  Diagnosis Date   Anemia    Asthma    CHF (congestive heart failure) (HCC)    Pt reports that father has CHF, not her   Diabetes mellitus without complication (HCC)    Dyslipidemia    GERD (gastroesophageal reflux disease)    Pulmonary embolism (HCC)    Stroke (HCC)     Tobacco History: Social History   Tobacco Use  Smoking Status Never  Smokeless Tobacco Never   Counseling given: Not Answered   Outpatient Medications Prior to Visit  Medication Sig Dispense Refill   carvedilol (COREG) 6.25 MG tablet Take by mouth.     glucose blood test strip Use as instructed 100 each 3   hydrALAZINE (APRESOLINE) 100 MG tablet Take by mouth.     insulin degludec (TRESIBA FLEXTOUCH) 200 UNIT/ML FlexTouch Pen INJECT 62 UNITS SUBCUTANEOUSLY ONCE DAILY     Insulin Syringe-Needle U-100 (INSULIN SYRINGE 1CC/28G) 28G X 1/2" 1 ML MISC 1 application by Does not apply route 3 (three) times daily before meals. 100 each 0   isosorbide mononitrate (IMDUR) 30 MG 24 hr tablet Take by mouth.     MULTIPLE VITAMINS PO Take by mouth.     nitroGLYCERIN (NITROSTAT) 0.4 MG SL tablet Take by mouth.     rosuvastatin (CRESTOR) 40 MG tablet Take 1 tablet (40 mg total) by mouth daily. 30 tablet 0   Vitamin D, Ergocalciferol, (DRISDOL) 1.25 MG (50000 UNIT) CAPS capsule Take 50,000 Units by mouth once a week.     XARELTO 20 MG TABS tablet Take 20 mg by mouth  daily.     Continuous Blood Gluc Receiver (DEXCOM G6 RECEIVER) DEVI by Does not apply route. (Patient not taking: Reported on 11/13/2022)     Continuous Blood Gluc Sensor (DEXCOM G6 SENSOR) MISC by Does not apply route. (Patient not taking: Reported on 11/13/2022)     Continuous Blood Gluc Transmit (DEXCOM G6 TRANSMITTER) MISC by Does not apply route. (Patient not taking: Reported on 11/13/2022)     amoxicillin (AMOXIL) 500 MG capsule Take 500 mg by mouth 3 (three) times daily. (Patient not taking: Reported on 11/13/2022)     AZO D-MANNOSE 500 MG CAPS  Take 1 capsule by mouth daily. (Patient not taking: Reported on 11/13/2022)     fluconazole (DIFLUCAN) 150 MG tablet Take by mouth. (Patient not taking: Reported on 11/13/2022)     furosemide (LASIX) 20 MG tablet Take 1 tablet (20 mg total) by mouth 2 (two) times daily. (Patient not taking: Reported on 11/13/2022) 60 tablet 0   furosemide (LASIX) 40 MG tablet Take by mouth. (Patient not taking: Reported on 03/10/2023)     gabapentin (NEURONTIN) 300 MG capsule Take 1 capsule (300 mg total) by mouth daily as needed (pain). 30 capsule 3   hydrALAZINE (APRESOLINE) 50 MG tablet Take 1 tablet (50 mg total) by mouth 3 (three) times daily. (Patient not taking: Reported on 11/13/2022) 90 tablet 1   insulin aspart (NOVOLOG FLEXPEN) 100 UNIT/ML FlexPen Inject 0-20 Units as directed 3 (three) times daily. Sliding scale (Patient not taking: Reported on 11/13/2022)     Insulin Glargine (BASAGLAR KWIKPEN) 100 UNIT/ML Inject 44 units daily. Titrate as directed based on blood sugars up to 54 units daily. (Patient not taking: Reported on 11/13/2022)     metoprolol succinate (TOPROL-XL) 100 MG 24 hr tablet Take 1 tablet (100 mg total) by mouth daily. (Patient not taking: Reported on 08/15/2022) 30 tablet 0   MYFEMBREE 40-1-0.5 MG TABS Take 1 tablet every day by oral route. (Patient not taking: Reported on 11/13/2022)     No facility-administered medications prior to visit.   Review of Systems  Review of Systems  Constitutional:  Positive for fatigue.  Psychiatric/Behavioral:  Positive for sleep disturbance.    Physical Exam  BP 112/68 (BP Location: Left Arm, Patient Position: Sitting, Cuff Size: Normal)   Pulse 82   Temp (!) 97.4 F (36.3 C) (Temporal)   Ht 5\' 4"  (1.626 m)   Wt 240 lb (108.9 kg)   SpO2 97%   BMI 41.20 kg/m  Physical Exam Constitutional:      General: She is not in acute distress.    Appearance: Normal appearance. She is obese. She is not ill-appearing.  HENT:     Mouth/Throat:     Mouth:  Mucous membranes are moist.     Pharynx: Oropharynx is clear.  Cardiovascular:     Rate and Rhythm: Normal rate and regular rhythm.  Pulmonary:     Effort: Pulmonary effort is normal.     Breath sounds: Normal breath sounds.  Musculoskeletal:     Right lower leg: Edema present.     Left lower leg: Edema present.  Skin:    General: Skin is warm and dry.  Neurological:     General: No focal deficit present.     Mental Status: She is alert and oriented to person, place, and time. Mental status is at baseline.  Psychiatric:        Mood and Affect: Mood normal.        Behavior: Behavior  normal.        Thought Content: Thought content normal.        Judgment: Judgment normal.      Lab Results:  CBC    Component Value Date/Time   WBC 7.9 07/23/2022 1900   RBC 2.53 (L) 07/23/2022 1900   HGB 6.5 (LL) 07/23/2022 1900   HCT 22.3 (L) 07/23/2022 1900   PLT 222 07/23/2022 1900   MCV 88.1 07/23/2022 1900   MCH 25.7 (L) 07/23/2022 1900   MCHC 29.1 (L) 07/23/2022 1900   RDW 13.5 07/23/2022 1900   LYMPHSABS 2.2 07/23/2022 1900   MONOABS 0.5 07/23/2022 1900   EOSABS 0.2 07/23/2022 1900   BASOSABS 0.0 07/23/2022 1900    BMET    Component Value Date/Time   NA 139 07/23/2022 1900   K 4.0 07/23/2022 1900   CL 114 (H) 07/23/2022 1900   CO2 20 (L) 07/23/2022 1900   GLUCOSE 106 (H) 07/23/2022 1900   BUN 18 07/23/2022 1900   CREATININE 1.94 (H) 07/23/2022 1900   CALCIUM 8.1 (L) 07/23/2022 1900   GFRNONAA 32 (L) 07/23/2022 1900   GFRAA 57 (L) 07/30/2019 0500    BNP    Component Value Date/Time   BNP 365.6 (H) 01/28/2021 0333    ProBNP No results found for: "PROBNP"  Imaging: No results found.   Assessment & Plan:   Loud snoring - Patient has symptoms of loud snoring and restless sleep.  Medical history significant for CHF, PE and CVA.  Epworth 13.  Patient will need in lab split-night sleep study to assess for OSA.  Reviewed risks of untreated sleep apnea including  cardiac arrhythmias, pulmonary hypertension, diabetes and stroke.  We also discussed treatment options including weight loss, oral appliance, CPAP therapy or referral to ENT for possible surgical options.  Encourage side sleeping position or elevating head of bed 30 degrees (such as a wedge pillow).  Recommend she aim to get 6-8 hours of sleep and keep a consistent sleep schedule. Advised against driving if experiencing excessive daytime sleepiness fatigue.  Follow-up 1 to 2 weeks after sleep study to review results and treatment options if needed.  Glenford Bayley, NP 03/23/2023

## 2023-03-10 NOTE — Patient Instructions (Addendum)
  Sleep apnea is defined as period of 10 seconds or longer when you stop breathing at night. This can happen multiple times a night. Dx sleep apnea is when this occurs more than 5 times an hour.    Mild OSA 5-15 apneic events an hour Moderate OSA 15-30 apneic events an hour Severe OSA > 30 apneic events an hour   Untreated sleep apnea puts you at higher risk for cardiac arrhythmias, pulmonary HTN, stroke and diabetes  Treatment options include weight loss, side sleeping position, oral appliance, CPAP therapy or referral to ENT for possible surgical options    Recommendations: Focus on side sleeping position or elevate head with wedge pillow 30 degrees Work on weight loss efforts if able  Do not drive if experiencing excessive daytime sleepiness of fatigue  Look at getting wedge pillow to elevate head while sleeping (start with something like [employer]) Aim to get 6-8 hours of sleep   Orders: Split night sleep study re: loud snoring (ordered)   Follow-up: Please call to schedule follow-up 1-2 weeks after completing sleep study to review results and treatment if needed (can be virtual)

## 2023-03-23 DIAGNOSIS — R0683 Snoring: Secondary | ICD-10-CM | POA: Insufficient documentation

## 2023-03-23 NOTE — Assessment & Plan Note (Addendum)
-   Patient has symptoms of loud snoring and restless sleep.  Medical history significant for CHF, PE and CVA.  Epworth 13.  Patient will need in lab split-night sleep study to assess for OSA.  Reviewed risks of untreated sleep apnea including cardiac arrhythmias, pulmonary hypertension, diabetes and stroke.  We also discussed treatment options including weight loss, oral appliance, CPAP therapy or referral to ENT for possible surgical options.  Encourage side sleeping position or elevating head of bed 30 degrees (such as a wedge pillow).  Recommend she aim to get 6-8 hours of sleep and keep a consistent sleep schedule. Advised against driving if experiencing excessive daytime sleepiness fatigue.  Follow-up 1 to 2 weeks after sleep study to review results and treatment options if needed.

## 2023-03-23 NOTE — Progress Notes (Signed)
Reviewed and agree with assessment/plan.   Kataya Guimont, MD [facility] 03/23/2023, 8:08 AM Pager:  [phone]  

## 2023-04-04 ENCOUNTER — Other Ambulatory Visit: Payer: Self-pay | Admitting: Primary Care

## 2023-04-15 ENCOUNTER — Ambulatory Visit (HOSPITAL_BASED_OUTPATIENT_CLINIC_OR_DEPARTMENT_OTHER): Payer: 59 | Attending: Primary Care | Admitting: Pulmonary Disease

## 2023-04-15 DIAGNOSIS — G478 Other sleep disorders: Secondary | ICD-10-CM | POA: Insufficient documentation

## 2023-04-15 DIAGNOSIS — R29818 Other symptoms and signs involving the nervous system: Secondary | ICD-10-CM

## 2023-04-15 DIAGNOSIS — R0683 Snoring: Secondary | ICD-10-CM | POA: Diagnosis present

## 2023-04-15 DIAGNOSIS — I509 Heart failure, unspecified: Secondary | ICD-10-CM | POA: Diagnosis not present

## 2023-04-15 DIAGNOSIS — G4763 Sleep related bruxism: Secondary | ICD-10-CM | POA: Diagnosis not present

## 2023-04-24 DIAGNOSIS — R0683 Snoring: Secondary | ICD-10-CM

## 2023-04-24 NOTE — Procedures (Signed)
Patient Name: Sherri Dixon, Sherri Dixon Date: 04/15/2023 Gender: Female D.O.B: 1981/05/29 Age (years): 46 Referring Provider: Ames Dura NP Height (inches): 65 Interpreting Physician: Cyril Mourning MD, ABSM Weight (lbs): 232 RPSGT: Shelah Lewandowsky BMI: 39 MRN: [id number] Neck Size: 13.00 <br> <br> CLINICAL INFORMATION Sleep Study Type: NPSG    Indication for sleep study: Diabetes, Excessive Daytime Sleepiness, Fatigue, Hypertension, Obesity, Snoring    Epworth Sleepiness Score: 7    SLEEP STUDY TECHNIQUE As per the AASM Manual for the Scoring of Sleep and Associated Events v2.3 (April 2016) with a hypopnea requiring 4% desaturations.  The channels recorded and monitored were frontal, central and occipital EEG, electrooculogram (EOG), submentalis EMG (chin), nasal and oral airflow, thoracic and abdominal wall motion, anterior tibialis EMG, snore microphone, electrocardiogram, and pulse oximetry.  MEDICATIONS Medications self-administered by patient taken the night of the study : CARVEDILOL, HYDRALAZINE, TRESIBA FLEXTOUCH, XARELTO  SLEEP ARCHITECTURE The study was initiated at 10:21:08 PM and ended at 4:51:29 AM.  Sleep onset time was 29.7 minutes and the sleep efficiency was 72.2%. The total sleep time was 282 minutes.  Stage REM latency was 90.0 minutes.  The patient spent 8.0% of the night in stage N1 sleep, 83.5% in stage N2 sleep, 0.0% in stage N3 and 8.5% in REM.  Alpha intrusion was absent.  Supine sleep was 0.18%.  RESPIRATORY PARAMETERS The overall apnea/hypopnea index (AHI) was 3.2 per hour. There were 0 total apneas, including 0 obstructive, 0 central and 0 mixed apneas. There were 15 hypopneas and 35 RERAs.  The AHI during Stage REM sleep was 27.5 per hour.  AHI while supine was 0.0 per hour.  The mean oxygen saturation was 96.6%. The minimum SpO2 during sleep was 88.0%.  moderate snoring was noted during this study.  CARDIAC DATA The 2 lead EKG  demonstrated sinus rhythm. The mean heart rate was 83.4 beats per minute. Other EKG findings include: None.   LEG MOVEMENT DATA The total PLMS were 0 with a resulting PLMS index of 0.0. Associated arousal with leg movement index was 0.0 .  IMPRESSIONS - Very mild obstructive sleep apnea occurred during this study (RDI 10/h, AHI = 3.2/h). - The patient had minimal or no oxygen desaturation during the study (Min O2 = 88.0%) - The patient snored with moderate snoring volume. - No cardiac abnormalities were noted during this study. - Clinically significant periodic limb movements did not occur during sleep. No significant associated arousals.   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 - Sleep hygiene should be reviewed to assess factors that may improve sleep quality. - Weight management and regular exercise should be initiated or continued if appropriate.  [Electronically signed] 04/24/2023 04:49 PM  Cyril Mourning MD, ABSM Diplomate, American Board of Sleep Medicine NPI: [id number]

## 2023-04-24 NOTE — Procedures (Deleted)
No note

## 2023-04-28 NOTE — Progress Notes (Signed)
Please let patient know sleep study showed very minimal obstructive sleep apnea, overall AHI was 3.2/h.  Minimal to no oxygen desaturations. Does not need CPAP.  Recommend patient be referred to dentistry to be fitted for bite guard for bruxism (teeth grinding).  Avoid back sleeping position.  Avoid alcohol or sedatives prior to bedtime.  Maintain normal BMI.  If patient like to discuss results in greater detail please set her up for virtual visit

## 2023-05-05 ENCOUNTER — Other Ambulatory Visit: Payer: Self-pay

## 2023-05-05 DIAGNOSIS — F458 Other somatoform disorders: Secondary | ICD-10-CM

## 2023-05-05 NOTE — Progress Notes (Signed)
Recommend patient be referred to dentistry to be fitted for bite guard for bruxism (teeth grinding).   Placing order.

## 2023-06-10 ENCOUNTER — Other Ambulatory Visit: Payer: Self-pay | Admitting: Nephrology

## 2023-06-10 ENCOUNTER — Encounter: Payer: Self-pay | Admitting: Nephrology

## 2023-06-10 DIAGNOSIS — N184 Chronic kidney disease, stage 4 (severe): Secondary | ICD-10-CM

## 2023-06-10 DIAGNOSIS — E559 Vitamin D deficiency, unspecified: Secondary | ICD-10-CM

## 2023-06-15 ENCOUNTER — Ambulatory Visit
Admission: RE | Admit: 2023-06-15 | Discharge: 2023-06-15 | Disposition: A | Discharge status: Home / Self Care | Payer: 59 | Source: Ambulatory Visit | Attending: Nephrology | Admitting: Nephrology

## 2023-06-15 DIAGNOSIS — E559 Vitamin D deficiency, unspecified: Secondary | ICD-10-CM

## 2023-06-15 DIAGNOSIS — N184 Chronic kidney disease, stage 4 (severe): Secondary | ICD-10-CM

## 2023-09-27 ENCOUNTER — Emergency Department (HOSPITAL_COMMUNITY): Payer: 59

## 2023-09-27 ENCOUNTER — Inpatient Hospital Stay (HOSPITAL_COMMUNITY)
Admission: EM | Admit: 2023-09-27 | Discharge: 2023-10-06 | DRG: 673 | Disposition: A | Discharge status: Home Health | Payer: 59 | Attending: Internal Medicine | Admitting: Internal Medicine

## 2023-09-27 DIAGNOSIS — Z6838 Body mass index (BMI) 38.0-38.9, adult: Secondary | ICD-10-CM

## 2023-09-27 DIAGNOSIS — I2699 Other pulmonary embolism without acute cor pulmonale: Secondary | ICD-10-CM | POA: Diagnosis present

## 2023-09-27 DIAGNOSIS — Z86711 Personal history of pulmonary embolism: Secondary | ICD-10-CM

## 2023-09-27 DIAGNOSIS — I132 Hypertensive heart and chronic kidney disease with heart failure and with stage 5 chronic kidney disease, or end stage renal disease: Secondary | ICD-10-CM | POA: Diagnosis present

## 2023-09-27 DIAGNOSIS — Z7901 Long term (current) use of anticoagulants: Secondary | ICD-10-CM

## 2023-09-27 DIAGNOSIS — N920 Excessive and frequent menstruation with regular cycle: Secondary | ICD-10-CM | POA: Diagnosis present

## 2023-09-27 DIAGNOSIS — L299 Pruritus, unspecified: Secondary | ICD-10-CM | POA: Diagnosis present

## 2023-09-27 DIAGNOSIS — Z85528 Personal history of other malignant neoplasm of kidney: Secondary | ICD-10-CM

## 2023-09-27 DIAGNOSIS — N185 Chronic kidney disease, stage 5: Secondary | ICD-10-CM | POA: Diagnosis present

## 2023-09-27 DIAGNOSIS — E1169 Type 2 diabetes mellitus with other specified complication: Secondary | ICD-10-CM | POA: Diagnosis present

## 2023-09-27 DIAGNOSIS — Z794 Long term (current) use of insulin: Secondary | ICD-10-CM

## 2023-09-27 DIAGNOSIS — Z8673 Personal history of transient ischemic attack (TIA), and cerebral infarction without residual deficits: Secondary | ICD-10-CM

## 2023-09-27 DIAGNOSIS — N19 Unspecified kidney failure: Secondary | ICD-10-CM

## 2023-09-27 DIAGNOSIS — E1122 Type 2 diabetes mellitus with diabetic chronic kidney disease: Secondary | ICD-10-CM | POA: Diagnosis present

## 2023-09-27 DIAGNOSIS — E1142 Type 2 diabetes mellitus with diabetic polyneuropathy: Secondary | ICD-10-CM | POA: Diagnosis present

## 2023-09-27 DIAGNOSIS — I5033 Acute on chronic diastolic (congestive) heart failure: Secondary | ICD-10-CM | POA: Diagnosis present

## 2023-09-27 DIAGNOSIS — D62 Acute posthemorrhagic anemia: Secondary | ICD-10-CM | POA: Diagnosis present

## 2023-09-27 DIAGNOSIS — Z8249 Family history of ischemic heart disease and other diseases of the circulatory system: Secondary | ICD-10-CM

## 2023-09-27 DIAGNOSIS — D631 Anemia in chronic kidney disease: Secondary | ICD-10-CM | POA: Diagnosis present

## 2023-09-27 DIAGNOSIS — D649 Anemia, unspecified: Secondary | ICD-10-CM | POA: Diagnosis present

## 2023-09-27 DIAGNOSIS — E785 Hyperlipidemia, unspecified: Secondary | ICD-10-CM | POA: Diagnosis present

## 2023-09-27 DIAGNOSIS — Z888 Allergy status to other drugs, medicaments and biological substances status: Secondary | ICD-10-CM

## 2023-09-27 DIAGNOSIS — E872 Acidosis, unspecified: Secondary | ICD-10-CM | POA: Diagnosis present

## 2023-09-27 DIAGNOSIS — N179 Acute kidney failure, unspecified: Secondary | ICD-10-CM | POA: Diagnosis not present

## 2023-09-27 DIAGNOSIS — E669 Obesity, unspecified: Secondary | ICD-10-CM | POA: Diagnosis present

## 2023-09-27 DIAGNOSIS — Z86718 Personal history of other venous thrombosis and embolism: Secondary | ICD-10-CM

## 2023-09-27 DIAGNOSIS — Z79899 Other long term (current) drug therapy: Secondary | ICD-10-CM

## 2023-09-27 DIAGNOSIS — Z8349 Family history of other endocrine, nutritional and metabolic diseases: Secondary | ICD-10-CM

## 2023-09-27 DIAGNOSIS — J45909 Unspecified asthma, uncomplicated: Secondary | ICD-10-CM | POA: Diagnosis present

## 2023-09-27 DIAGNOSIS — Z833 Family history of diabetes mellitus: Secondary | ICD-10-CM

## 2023-09-27 DIAGNOSIS — Z992 Dependence on renal dialysis: Secondary | ICD-10-CM

## 2023-09-27 DIAGNOSIS — I3139 Other pericardial effusion (noninflammatory): Secondary | ICD-10-CM | POA: Diagnosis present

## 2023-09-27 DIAGNOSIS — D72829 Elevated white blood cell count, unspecified: Secondary | ICD-10-CM | POA: Diagnosis present

## 2023-09-27 DIAGNOSIS — D638 Anemia in other chronic diseases classified elsewhere: Secondary | ICD-10-CM | POA: Diagnosis present

## 2023-09-27 DIAGNOSIS — Z8261 Family history of arthritis: Secondary | ICD-10-CM

## 2023-09-27 DIAGNOSIS — K219 Gastro-esophageal reflux disease without esophagitis: Secondary | ICD-10-CM | POA: Diagnosis present

## 2023-09-27 DIAGNOSIS — Z6839 Body mass index (BMI) 39.0-39.9, adult: Secondary | ICD-10-CM

## 2023-09-27 DIAGNOSIS — Z905 Acquired absence of kidney: Secondary | ICD-10-CM

## 2023-09-27 DIAGNOSIS — E1165 Type 2 diabetes mellitus with hyperglycemia: Secondary | ICD-10-CM | POA: Diagnosis present

## 2023-09-27 LAB — BASIC METABOLIC PANEL
Anion gap: 12 (ref 5–15)
BUN: 67 mg/dL — ABNORMAL HIGH (ref 6–20)
CO2: 12 mmol/L — ABNORMAL LOW (ref 22–32)
Calcium: 6.2 mg/dL — CL (ref 8.9–10.3)
Chloride: 114 mmol/L — ABNORMAL HIGH (ref 98–111)
Creatinine, Ser: 12.57 mg/dL — ABNORMAL HIGH (ref 0.44–1.00)
GFR, Estimated: 3 mL/min — ABNORMAL LOW (ref >60)
Glucose, Bld: 126 mg/dL — ABNORMAL HIGH (ref 70–99)
Potassium: 4.9 mmol/L (ref 3.5–5.1)
Sodium: 138 mmol/L (ref 135–145)

## 2023-09-27 LAB — CBC
HCT: 21.8 % — ABNORMAL LOW (ref 36.0–46.0)
Hemoglobin: 6.7 g/dL — CL (ref 12.0–15.0)
MCH: 28.2 pg (ref 26.0–34.0)
MCHC: 30.7 g/dL (ref 30.0–36.0)
MCV: 91.6 fL (ref 80.0–100.0)
Platelets: 278 10*3/uL (ref 150–400)
RBC: 2.38 MIL/uL — ABNORMAL LOW (ref 3.87–5.11)
RDW: 14.3 % (ref 11.5–15.5)
WBC: 11.6 10*3/uL — ABNORMAL HIGH (ref 4.0–10.5)
nRBC: 0 % (ref 0.0–0.2)

## 2023-09-27 NOTE — ED Notes (Signed)
Date and time results received: 09/27/23 10:26 PM (use smartphrase ".now" to insert current time)  Test: Hemoglobin Critical Value: 6.7  Name of Provider Notified: Countryman   Orders Received? Or Actions Taken?: See MAR

## 2023-09-27 NOTE — ED Notes (Signed)
Gave pt water per MD approval

## 2023-09-27 NOTE — ED Provider Notes (Signed)
[place] EMERGENCY DEPARTMENT AT [facility] Provider Note   CSN: [id number] Arrival date & time: 09/27/23  2038     History  Chief Complaint  Patient presents with   Leg Swelling    Antonella Draves is a 42 y.o. female.  HPI     This is a 42 year old female with a history of hypertension, diabetes, chronic kidney disease, chronic anemia who presents with itchy rash and leg swelling.  Patient states she has had 2 weeks of progressively worsening itchy rash that is all over her body.  She also states that she has had significant lower extremity swelling.  She does report that she continues to have urine output.  No recent medications changes or significant NSAID use.  Denies any recent illnesses.  Denies abdominal pain or flank pain.  Reports increased fatigue.  Home Medications Prior to Admission medications   Medication Sig Start Date End Date Taking? Authorizing Provider  carvedilol (COREG) 6.25 MG tablet Take by mouth. 08/04/22  Yes [provider]  hydrALAZINE (APRESOLINE) 100 MG tablet Take by mouth. 08/04/22  Yes [provider]  insulin degludec (TRESIBA FLEXTOUCH) 200 UNIT/ML FlexTouch Pen INJECT 62 UNITS SUBCUTANEOUSLY ONCE DAILY 11/19/22  Yes [provider]  isosorbide mononitrate (IMDUR) 30 MG 24 hr tablet Take by mouth. 08/05/22  Yes [provider]  MULTIPLE VITAMINS PO Take by mouth.   Yes [provider]  nitroGLYCERIN (NITROSTAT) 0.4 MG SL tablet Take by mouth. 04/17/19  Yes [provider]  NOVOLOG FLEXPEN 100 UNIT/ML FlexPen Inject 8 Units into the skin See admin instructions. INJECT 8UNITS INTO SKIN BEFORE MEALS*IF YOUR BLOOD GLUCOSE >200,THEN INJECT 12 UNITS   Yes [provider]  omeprazole (PRILOSEC) 20 MG capsule TAKE 1 CAPSULE (20 MG TOTAL) BY MOUTH 2 (TWO) TIMES DAILY BEFORE A MEAL. 04/14/23  Yes Glenford Bayley, NP  rosuvastatin (CRESTOR) 40 MG tablet Take 1 tablet (40 mg total) by  mouth daily. 01/29/21  Yes Leroy Sea, MD  Vitamin D, Ergocalciferol, (DRISDOL) 1.25 MG (50000 UNIT) CAPS capsule Take 50,000 Units by mouth once a week. 03/19/21  Yes [provider]  XARELTO 20 MG TABS tablet Take 20 mg by mouth daily. 10/15/20  Yes [provider]  Continuous Blood Gluc Receiver (DEXCOM G6 RECEIVER) DEVI by Does not apply route. Patient not taking: Reported on 11/13/2022 08/13/22   [provider]  Continuous Blood Gluc Sensor (DEXCOM G6 SENSOR) MISC by Does not apply route. Patient not taking: Reported on 11/13/2022 08/13/22   [provider]  Continuous Blood Gluc Transmit (DEXCOM G6 TRANSMITTER) MISC by Does not apply route. Patient not taking: Reported on 11/13/2022 08/13/22   [provider]  glucose blood test strip Use as instructed 06/06/16   Dhungel, Nishant, MD  Insulin Syringe-Needle U-100 (INSULIN SYRINGE 1CC/28G) 28G X 1/2" 1 ML MISC 1 application by Does not apply route 3 (three) times daily before meals. 06/06/16   Dhungel, Theda Belfast, MD      Allergies    Amlodipine, Insulin glargine, and Humalog kwikpen [insulin lispro]    Review of Systems   Review of Systems  Constitutional:  Negative for fever.  Respiratory:  Negative for shortness of breath.   Cardiovascular:  Positive for leg swelling. Negative for chest pain.  Gastrointestinal:  Negative for abdominal pain.  Skin:  Positive for rash.  All other systems reviewed and are negative.   Physical Exam Updated Vital Signs BP (!) 162/84   Pulse  94   Temp 98.7 F (37.1 C) (Oral)   Resp 16   LMP 09/25/2023   SpO2 100%  Physical Exam Vitals and nursing note reviewed.  Constitutional:      Appearance: She is well-developed. She is obese. She is not ill-appearing.  HENT:     Head: Normocephalic and atraumatic.  Eyes:     Pupils: Pupils are equal, round, and reactive to light.  Cardiovascular:     Rate and Rhythm: Normal rate and regular rhythm.     Heart  sounds: Normal heart sounds.  Pulmonary:     Effort: Pulmonary effort is normal. No respiratory distress.     Breath sounds: No wheezing.  Abdominal:     Palpations: Abdomen is soft.  Musculoskeletal:        General: Swelling present.     Cervical back: Neck supple.     Comments: 3+ pitting edema bilaterally  Skin:    General: Skin is warm and dry.     Comments: Diffusely papular rash over the trunk extremities, nonerythematous  Neurological:     Mental Status: She is alert and oriented to person, place, and time.  Psychiatric:        Mood and Affect: Mood normal.     ED Results / Procedures / Treatments   Labs (all labs ordered are listed, but only abnormal results are displayed) Labs Reviewed  BASIC METABOLIC PANEL - Abnormal; Notable for the following components:      Result Value   Chloride 114 (*)    CO2 12 (*)    Glucose, Bld 126 (*)    BUN 67 (*)    Creatinine, Ser 12.57 (*)    Calcium 6.2 (*)    GFR, Estimated 3 (*)    All other components within normal limits  CBC - Abnormal; Notable for the following components:   WBC 11.6 (*)    RBC 2.38 (*)    Hemoglobin 6.7 (*)    HCT 21.8 (*)    All other components within normal limits  URINALYSIS, ROUTINE W REFLEX MICROSCOPIC  SODIUM, URINE, RANDOM  CREATININE, URINE, RANDOM  HEPATIC FUNCTION PANEL  TYPE AND SCREEN  PREPARE RBC (CROSSMATCH)    EKG None  Radiology CT ABDOMEN PELVIS WO CONTRAST Result Date: 09/28/2023 CLINICAL DATA:  Provided history: Kidney cancer, post nephrectomy, monitor EXAM: CT ABDOMEN AND PELVIS WITHOUT CONTRAST TECHNIQUE: Multidetector CT imaging of the abdomen and pelvis was performed following the standard protocol without IV contrast. RADIATION DOSE REDUCTION: This exam was performed according to the departmental dose-optimization program which includes automated exposure control, adjustment of the mA and/or kV according to patient size and/or use of iterative reconstruction technique.  COMPARISON:  Renal ultrasound 06/15/2023, CT 11/08/2019 FINDINGS: Lower chest: The heart is mildly enlarged. Moderate circumferential pericardial effusion measuring up to 2.1 cm adjacent to the right ventricle. Mitral annulus calcifications. No pleural effusion. Hepatobiliary: No evidence of focal liver lesion on this unenhanced exam. The liver is enlarged spanning 21.3 cm cranial caudal. Gallbladder physiologically distended, no calcified stone. No biliary dilatation. Pancreas: No ductal dilatation or inflammation. Spleen: Normal in size without focal abnormality. Adrenals/Urinary Tract: Normal adrenal glands. Contour deformity with probable postsurgical change in the upper right kidney. Simple appearing cyst in the mid right kidney, simple fluid density. No evidence of solid lesion. No hydronephrosis or renal calculi of either kidney. Decompressed ureters. Mild wall thickening about the anterior bladder. Stomach/Bowel: Unremarkable appearance of the stomach. Occasional fluid-filled small bowel in the  pelvis. No obstruction or inflammation. Colonic redundancy with small to moderate volume of stool in the colon. Vascular/Lymphatic: Normal caliber abdominal aorta. Mild but age advanced aortic atherosclerosis. No enlarged lymph nodes. Prominent ileocolic node at 9 mm is unchanged from prior CT. Reproductive: Suspected anterior fundal fibroid. Ovaries are not well-defined. No suspicious adnexal mass. Other: Small volume abdominopelvic ascites. Right lower ventral abdominal wall hernia containing fat and small amount of ascites. There is generalized subcutaneous body wall edema. Musculoskeletal: No focal bone lesion.  No acute osseous finding. IMPRESSION: 1. Postsurgical change in the upper right kidney. No evidence of recurrent or metastatic disease in the abdomen/pelvis. 2. Hepatomegaly. 3. Small volume abdominopelvic ascites. Generalized subcutaneous body wall edema. 4. Moderate circumferential pericardial effusion.  5. Mild wall thickening about the anterior bladder, may be due to underdistention or cystitis. Recommend correlation with urinalysis. 6. Right lower ventral abdominal wall hernia containing fat and small amount of ascites. Aortic Atherosclerosis (ICD10-I70.0). Electronically Signed   By: Narda Rutherford M.D.   On: 09/28/2023 02:58    Procedures .Critical Care  Performed by: Shon Baton, MD Authorized by: Shon Baton, MD   Critical care provider statement:    Critical care time (minutes):  65   Critical care was necessary to treat or prevent imminent or life-threatening deterioration of the following conditions:  Renal failure (Symptomatic anemia)   Critical care was time spent personally by me on the following activities:  Development of treatment plan with patient or surrogate, discussions with consultants, evaluation of patient's response to treatment, examination of patient, ordering and review of laboratory studies, ordering and review of radiographic studies, ordering and performing treatments and interventions, pulse oximetry, re-evaluation of patient's condition and review of old charts     Medications Ordered in ED Medications  sodium chloride flush (NS) 0.9 % injection 10-40 mL (has no administration in time range)  0.9 %  sodium chloride infusion (Manually program via Guardrails IV Fluids) (has no administration in time range)  oxyCODONE-acetaminophen (PERCOCET/ROXICET) 5-325 MG per tablet 1 tablet (1 tablet Oral Given 09/28/23 0306)    ED Course/ Medical Decision Making/ A&P Clinical Course as of 09/28/23 0335  Mon Sep 28, 2023  0981 Spoke with Dr. Valentino Nose, nephrology.  Recommends making her n.p.o.  Request hospitalist admission to [facility]. [CH]  0334 Outside lab work reviewed.  Most recent creatinine 6.9. [CH]    Clinical Course User Index [CH] Juel Bellerose, Mayer Masker, MD                                 Medical Decision Making Amount and/or Complexity of Data  Reviewed Labs: ordered. Radiology: ordered.  Risk Prescription drug management. Decision regarding hospitalization.   This patient presents to the ED for concern of rash, lower extremity swelling, this involves an extensive number of treatment options, and is a complaint that carries with it a high risk of complications and morbidity.  I considered the following differential and admission for this acute, potentially life threatening condition.  The differential diagnosis includes CHF, dependent edema, worsening renal function, symptomatic anemia  MDM:    This is a 42 year old female who presents with leg swelling, rash, increasing fatigue.  She is obese but not ill-appearing.  Mildly tachycardic.  Labs notable for creatinine of greater than 12.  Most recent creatinine in our system 1.94.  However it appears that she did have a creatinine is most recent as  September at 6.9 in an outside system.  Calcium is 6.2.  BUN is 67.  Patient does state that she continues to make urine.  Suspect her lower extremity swelling and rash may be related to renal failure and uremia.  Hemoglobin is again down to 6.7.  She has a history of chronic anemia related to heavy menstrual cycles.  Patient was typed and screened.  Given her history of renal mass, CT scan was obtained but does not show any recurrence or metastasis.  Of note, it does show pericardial effusion.  Again concerning for possible uremic origin.  EKG was added.  Discussed with nephrology.  Recommends hospitalist admission at [facility].  Request that she be n.p.o.  This order was placed.  (Labs, imaging, consults)  Labs: I Ordered, and personally interpreted labs.  The pertinent results include:  CBC, BMP, type and screen, urinalysis, urine sodium, urine creatinine  Imaging Studies ordered: I ordered imaging studies including CT scan I independently visualized and interpreted imaging. I agree with the radiologist interpretation  Additional  history obtained from bedside.  External records from outside source obtained and reviewed including outside basic lab work  Cardiac Monitoring: The patient was maintained on a cardiac monitor.  If on the cardiac monitor, I personally viewed and interpreted the cardiac monitored which showed an underlying rhythm of: Sinus  Reevaluation: After the interventions noted above, I reevaluated the patient and found that they have :stayed the same  Social Determinants of Health:  lives independently  Disposition: Admit  Co morbidities that complicate the patient evaluation  Past Medical History:  Diagnosis Date   Anemia    Asthma    CHF (congestive heart failure) (HCC)    Pt reports that father has CHF, not her   Diabetes mellitus without complication (HCC)    Dyslipidemia    GERD (gastroesophageal reflux disease)    Pulmonary embolism (HCC)    Stroke (HCC)      Medicines Meds ordered this encounter  Medications   sodium chloride flush (NS) 0.9 % injection 10-40 mL   0.9 %  sodium chloride infusion (Manually program via Guardrails IV Fluids)   oxyCODONE-acetaminophen (PERCOCET/ROXICET) 5-325 MG per tablet 1 tablet    Refill:  0    I have reviewed the patients home medicines and have made adjustments as needed  Problem List / ED Course: Problem List Items Addressed This Visit   None Visit Diagnoses       Acute renal failure, unspecified acute renal failure type (HCC)    -  Primary     Hypocalcemia         Symptomatic anemia         Uremia                       Final Clinical Impression(s) / ED Diagnoses Final diagnoses:  Acute renal failure, unspecified acute renal failure type (HCC)  Hypocalcemia  Symptomatic anemia  Uremia    Rx / DC Orders ED Discharge Orders     None         Shon Baton, MD 09/28/23 [phone]

## 2023-09-27 NOTE — ED Triage Notes (Signed)
Patient arrived with complaints of bilateral lower leg swelling causing her legs to itch for an unknown amount of time. States she has a history of anemia and feeling fatigued.

## 2023-09-28 ENCOUNTER — Inpatient Hospital Stay (HOSPITAL_COMMUNITY): Payer: 59

## 2023-09-28 DIAGNOSIS — D631 Anemia in chronic kidney disease: Secondary | ICD-10-CM | POA: Diagnosis present

## 2023-09-28 DIAGNOSIS — D62 Acute posthemorrhagic anemia: Secondary | ICD-10-CM

## 2023-09-28 DIAGNOSIS — J45909 Unspecified asthma, uncomplicated: Secondary | ICD-10-CM | POA: Diagnosis present

## 2023-09-28 DIAGNOSIS — E1142 Type 2 diabetes mellitus with diabetic polyneuropathy: Secondary | ICD-10-CM | POA: Diagnosis present

## 2023-09-28 DIAGNOSIS — N185 Chronic kidney disease, stage 5: Secondary | ICD-10-CM | POA: Diagnosis present

## 2023-09-28 DIAGNOSIS — I132 Hypertensive heart and chronic kidney disease with heart failure and with stage 5 chronic kidney disease, or end stage renal disease: Secondary | ICD-10-CM | POA: Diagnosis present

## 2023-09-28 DIAGNOSIS — Z86718 Personal history of other venous thrombosis and embolism: Secondary | ICD-10-CM | POA: Diagnosis not present

## 2023-09-28 DIAGNOSIS — M7989 Other specified soft tissue disorders: Secondary | ICD-10-CM

## 2023-09-28 DIAGNOSIS — Z905 Acquired absence of kidney: Secondary | ICD-10-CM | POA: Diagnosis not present

## 2023-09-28 DIAGNOSIS — Z6839 Body mass index (BMI) 39.0-39.9, adult: Secondary | ICD-10-CM | POA: Diagnosis not present

## 2023-09-28 DIAGNOSIS — E1165 Type 2 diabetes mellitus with hyperglycemia: Secondary | ICD-10-CM | POA: Diagnosis present

## 2023-09-28 DIAGNOSIS — E872 Acidosis, unspecified: Secondary | ICD-10-CM | POA: Diagnosis present

## 2023-09-28 DIAGNOSIS — N179 Acute kidney failure, unspecified: Secondary | ICD-10-CM | POA: Diagnosis present

## 2023-09-28 DIAGNOSIS — E785 Hyperlipidemia, unspecified: Secondary | ICD-10-CM | POA: Diagnosis present

## 2023-09-28 DIAGNOSIS — D72829 Elevated white blood cell count, unspecified: Secondary | ICD-10-CM | POA: Diagnosis present

## 2023-09-28 DIAGNOSIS — I3139 Other pericardial effusion (noninflammatory): Secondary | ICD-10-CM

## 2023-09-28 DIAGNOSIS — E669 Obesity, unspecified: Secondary | ICD-10-CM | POA: Diagnosis present

## 2023-09-28 DIAGNOSIS — R9431 Abnormal electrocardiogram [ECG] [EKG]: Secondary | ICD-10-CM | POA: Diagnosis not present

## 2023-09-28 DIAGNOSIS — Z8249 Family history of ischemic heart disease and other diseases of the circulatory system: Secondary | ICD-10-CM | POA: Diagnosis not present

## 2023-09-28 DIAGNOSIS — Z992 Dependence on renal dialysis: Secondary | ICD-10-CM | POA: Diagnosis not present

## 2023-09-28 DIAGNOSIS — I5033 Acute on chronic diastolic (congestive) heart failure: Secondary | ICD-10-CM | POA: Diagnosis present

## 2023-09-28 DIAGNOSIS — Z86711 Personal history of pulmonary embolism: Secondary | ICD-10-CM | POA: Diagnosis not present

## 2023-09-28 DIAGNOSIS — Z7901 Long term (current) use of anticoagulants: Secondary | ICD-10-CM | POA: Diagnosis not present

## 2023-09-28 DIAGNOSIS — E1122 Type 2 diabetes mellitus with diabetic chronic kidney disease: Secondary | ICD-10-CM | POA: Diagnosis present

## 2023-09-28 DIAGNOSIS — Z79899 Other long term (current) drug therapy: Secondary | ICD-10-CM | POA: Diagnosis not present

## 2023-09-28 DIAGNOSIS — N189 Chronic kidney disease, unspecified: Secondary | ICD-10-CM

## 2023-09-28 LAB — HEMOGLOBIN A1C
Hgb A1c MFr Bld: 6.1 % — ABNORMAL HIGH (ref 4.8–5.6)
Mean Plasma Glucose: 128 mg/dL

## 2023-09-28 LAB — COMPREHENSIVE METABOLIC PANEL
ALT: 14 U/L (ref 0–44)
AST: 16 U/L (ref 15–41)
Albumin: 2.4 g/dL — ABNORMAL LOW (ref 3.5–5.0)
Alkaline Phosphatase: 98 U/L (ref 38–126)
Anion gap: 13 (ref 5–15)
BUN: 62 mg/dL — ABNORMAL HIGH (ref 6–20)
CO2: 12 mmol/L — ABNORMAL LOW (ref 22–32)
Calcium: 6.2 mg/dL — CL (ref 8.9–10.3)
Chloride: 111 mmol/L (ref 98–111)
Creatinine, Ser: 12.38 mg/dL — ABNORMAL HIGH (ref 0.44–1.00)
GFR, Estimated: 3 mL/min — ABNORMAL LOW (ref >60)
Glucose, Bld: 108 mg/dL — ABNORMAL HIGH (ref 70–99)
Potassium: 4.2 mmol/L (ref 3.5–5.1)
Sodium: 136 mmol/L (ref 135–145)
Total Bilirubin: 0.5 mg/dL (ref <1.2)
Total Protein: 6.1 g/dL — ABNORMAL LOW (ref 6.5–8.1)

## 2023-09-28 LAB — BLOOD GAS, VENOUS
Acid-base deficit: 14 mmol/L — ABNORMAL HIGH (ref 0.0–2.0)
Bicarbonate: 12.9 mmol/L — ABNORMAL LOW (ref 20.0–28.0)
O2 Saturation: 47.9 %
Patient temperature: 37
pCO2, Ven: 33 mm[Hg] — ABNORMAL LOW (ref 44–60)
pH, Ven: 7.2 — ABNORMAL LOW (ref 7.25–7.43)
pO2, Ven: <31 mm[Hg] — CL (ref 32–45)

## 2023-09-28 LAB — PHOSPHORUS: Phosphorus: 8.6 mg/dL — ABNORMAL HIGH (ref 2.5–4.6)

## 2023-09-28 LAB — ECHOCARDIOGRAM COMPLETE
AR max vel: 2.52 cm2
AV Area VTI: 2.48 cm2
AV Area mean vel: 2.19 cm2
AV Mean grad: 5 mm[Hg]
AV Peak grad: 8.1 mm[Hg]
Ao pk vel: 1.42 m/s
Area-P 1/2: 2.91 cm2
MV VTI: 2.23 cm2
S' Lateral: 2.9 cm

## 2023-09-28 LAB — GLUCOSE, CAPILLARY
Glucose-Capillary: 144 mg/dL — ABNORMAL HIGH (ref 70–99)
Glucose-Capillary: 106 mg/dL — ABNORMAL HIGH (ref 70–99)

## 2023-09-28 LAB — HEPATIC FUNCTION PANEL
ALT: 14 U/L (ref 0–44)
AST: 17 U/L (ref 15–41)
Albumin: 2.4 g/dL — ABNORMAL LOW (ref 3.5–5.0)
Alkaline Phosphatase: 100 U/L (ref 38–126)
Bilirubin, Direct: 0.1 mg/dL (ref 0.0–0.2)
Indirect Bilirubin: 0.5 mg/dL (ref 0.3–0.9)
Total Bilirubin: 0.6 mg/dL (ref <1.2)
Total Protein: 6 g/dL — ABNORMAL LOW (ref 6.5–8.1)

## 2023-09-28 LAB — CBC
HCT: 25.2 % — ABNORMAL LOW (ref 36.0–46.0)
Hemoglobin: 8.2 g/dL — ABNORMAL LOW (ref 12.0–15.0)
MCH: 30.1 pg (ref 26.0–34.0)
MCHC: 32.5 g/dL (ref 30.0–36.0)
MCV: 92.6 fL (ref 80.0–100.0)
Platelets: 280 10*3/uL (ref 150–400)
RBC: 2.72 MIL/uL — ABNORMAL LOW (ref 3.87–5.11)
RDW: 13.8 % (ref 11.5–15.5)
WBC: 12.6 10*3/uL — ABNORMAL HIGH (ref 4.0–10.5)
nRBC: 0 % (ref 0.0–0.2)

## 2023-09-28 LAB — MAGNESIUM: Magnesium: 1.9 mg/dL (ref 1.7–2.4)

## 2023-09-28 LAB — URINALYSIS, ROUTINE W REFLEX MICROSCOPIC
Bacteria, UA: NONE SEEN
Bilirubin Urine: NEGATIVE
Glucose, UA: 50 mg/dL — AB
Hgb urine dipstick: NEGATIVE
Ketones, ur: NEGATIVE mg/dL
Leukocytes,Ua: NEGATIVE
Nitrite: NEGATIVE
Protein, ur: >=300 mg/dL — AB
Specific Gravity, Urine: 1.013 (ref 1.005–1.030)
pH: 5 (ref 5.0–8.0)

## 2023-09-28 LAB — PREPARE RBC (CROSSMATCH)

## 2023-09-28 LAB — CREATININE, URINE, RANDOM: Creatinine, Urine: 118 mg/dL

## 2023-09-28 LAB — HIV ANTIBODY (ROUTINE TESTING W REFLEX): HIV Screen 4th Generation wRfx: NONREACTIVE

## 2023-09-28 LAB — CBG MONITORING, ED
Glucose-Capillary: 74 mg/dL (ref 70–99)
Glucose-Capillary: 126 mg/dL — ABNORMAL HIGH (ref 70–99)

## 2023-09-28 LAB — BRAIN NATRIURETIC PEPTIDE: B Natriuretic Peptide: 378.5 pg/mL — ABNORMAL HIGH (ref 0.0–100.0)

## 2023-09-28 LAB — URIC ACID: Uric Acid, Serum: 7.5 mg/dL — ABNORMAL HIGH (ref 2.5–7.1)

## 2023-09-28 LAB — SODIUM, URINE, RANDOM: Sodium, Ur: 62 mmol/L

## 2023-09-28 MED ORDER — POLYETHYLENE GLYCOL 3350 17 G PO PACK
17.0000 g | PACK | Freq: Every day | ORAL | Status: DC | PRN
  Administered 2023-10-01: 17 g via ORAL
  Filled 2023-09-28: qty 1

## 2023-09-28 MED ORDER — DIPHENHYDRAMINE HCL 50 MG/ML IJ SOLN
12.5000 mg | Freq: Once | INTRAMUSCULAR | Status: AC
  Administered 2023-09-28: 12.5 mg via INTRAVENOUS
  Filled 2023-09-28: qty 1

## 2023-09-28 MED ORDER — FUROSEMIDE 10 MG/ML IJ SOLN
120.0000 mg | Freq: Four times a day (QID) | INTRAVENOUS | Status: DC
  Administered 2023-09-28 – 2023-10-02 (×15): 120 mg via INTRAVENOUS
  Filled 2023-09-28 (×10): qty 10
  Filled 2023-09-28 (×4): qty 12
  Filled 2023-09-28: qty 2
  Filled 2023-09-28 (×2): qty 10
  Filled 2023-09-28: qty 2
  Filled 2023-09-28: qty 10

## 2023-09-28 MED ORDER — HYDRALAZINE HCL 25 MG PO TABS
50.0000 mg | ORAL_TABLET | Freq: Three times a day (TID) | ORAL | Status: DC
  Administered 2023-09-28 – 2023-10-05 (×16): 50 mg via ORAL
  Filled 2023-09-28 (×20): qty 2

## 2023-09-28 MED ORDER — ACETAMINOPHEN 325 MG PO TABS
650.0000 mg | ORAL_TABLET | Freq: Four times a day (QID) | ORAL | Status: DC | PRN
  Administered 2023-10-01 – 2023-10-06 (×3): 650 mg via ORAL
  Filled 2023-09-28 (×4): qty 2

## 2023-09-28 MED ORDER — HYDRALAZINE HCL 20 MG/ML IJ SOLN
5.0000 mg | Freq: Four times a day (QID) | INTRAMUSCULAR | Status: DC | PRN
  Administered 2023-09-28: 5 mg via INTRAVENOUS
  Filled 2023-09-28 (×2): qty 1

## 2023-09-28 MED ORDER — DIPHENHYDRAMINE HCL 25 MG PO CAPS
25.0000 mg | ORAL_CAPSULE | Freq: Four times a day (QID) | ORAL | Status: DC | PRN
  Administered 2023-09-28 – 2023-10-03 (×8): 25 mg via ORAL
  Filled 2023-09-28 (×10): qty 1

## 2023-09-28 MED ORDER — ISOSORBIDE MONONITRATE ER 30 MG PO TB24
30.0000 mg | ORAL_TABLET | Freq: Every day | ORAL | Status: DC
  Administered 2023-09-28 – 2023-10-05 (×8): 30 mg via ORAL
  Filled 2023-09-28 (×8): qty 1

## 2023-09-28 MED ORDER — HYDROMORPHONE HCL 1 MG/ML IJ SOLN
0.5000 mg | INTRAMUSCULAR | Status: AC | PRN
  Administered 2023-09-28 – 2023-09-30 (×3): 0.5 mg via INTRAVENOUS
  Filled 2023-09-28 (×5): qty 0.5

## 2023-09-28 MED ORDER — OXYCODONE HCL 5 MG PO TABS
5.0000 mg | ORAL_TABLET | Freq: Four times a day (QID) | ORAL | Status: AC | PRN
  Administered 2023-09-29 – 2023-10-01 (×3): 5 mg via ORAL
  Filled 2023-09-28 (×4): qty 1

## 2023-09-28 MED ORDER — HYDROMORPHONE HCL 1 MG/ML IJ SOLN
0.5000 mg | INTRAMUSCULAR | Status: AC
Start: 2023-09-28 — End: 2023-09-28
  Administered 2023-09-28: 0.5 mg via INTRAVENOUS
  Filled 2023-09-28: qty 1

## 2023-09-28 MED ORDER — HYDRALAZINE HCL 100 MG PO TABS: 50.0000 mg | ORAL_TABLET | Freq: Three times a day (TID) | ORAL | Status: DC

## 2023-09-28 MED ORDER — RIVAROXABAN 20 MG PO TABS: 20.0000 mg | ORAL_TABLET | Freq: Every day | ORAL | Status: DC

## 2023-09-28 MED ORDER — CARVEDILOL 6.25 MG PO TABS
6.2500 mg | ORAL_TABLET | Freq: Two times a day (BID) | ORAL | Status: DC
  Administered 2023-09-28 – 2023-10-05 (×14): 6.25 mg via ORAL
  Filled 2023-09-28 (×2): qty 1
  Filled 2023-09-28: qty 2
  Filled 2023-09-28 (×10): qty 1
  Filled 2023-09-28: qty 2
  Filled 2023-09-28 (×2): qty 1

## 2023-09-28 MED ORDER — OXYCODONE-ACETAMINOPHEN 5-325 MG PO TABS
1.0000 | ORAL_TABLET | Freq: Once | ORAL | Status: AC
  Administered 2023-09-28: 1 via ORAL
  Filled 2023-09-28: qty 1

## 2023-09-28 MED ORDER — CALCIUM GLUCONATE-NACL 2-0.675 GM/100ML-% IV SOLN
2.0000 g | Freq: Once | INTRAVENOUS | Status: AC
  Administered 2023-09-28: 2000 mg via INTRAVENOUS
  Filled 2023-09-28: qty 100

## 2023-09-28 MED ORDER — SODIUM CHLORIDE 0.9% FLUSH: 10.0000 mL | INTRAVENOUS | Status: DC | PRN

## 2023-09-28 MED ORDER — HEPARIN SODIUM (PORCINE) 5000 UNIT/ML IJ SOLN
5000.0000 [IU] | Freq: Three times a day (TID) | INTRAMUSCULAR | Status: DC
  Administered 2023-09-28 – 2023-10-01 (×9): 5000 [IU] via SUBCUTANEOUS
  Filled 2023-09-28 (×9): qty 1

## 2023-09-28 MED ORDER — INSULIN ASPART 100 UNIT/ML IJ SOLN
0.0000 [IU] | INTRAMUSCULAR | Status: DC
  Administered 2023-10-01: 1 [IU] via SUBCUTANEOUS
  Filled 2023-09-28: qty 0.06

## 2023-09-28 MED ORDER — SODIUM BICARBONATE 650 MG PO TABS
650.0000 mg | ORAL_TABLET | Freq: Three times a day (TID) | ORAL | Status: DC
Start: 2023-09-28 — End: 2023-10-04
  Administered 2023-09-28 – 2023-10-04 (×16): 650 mg via ORAL
  Filled 2023-09-28 (×21): qty 1

## 2023-09-28 MED ORDER — CALCIUM GLUCONATE-NACL 1-0.675 GM/50ML-% IV SOLN
1.0000 g | Freq: Once | INTRAVENOUS | Status: AC
  Administered 2023-09-28: 1000 mg via INTRAVENOUS
  Filled 2023-09-28: qty 50

## 2023-09-28 MED ORDER — SODIUM CHLORIDE 0.9% IV SOLUTION: Freq: Once | INTRAVENOUS | Status: AC

## 2023-09-28 NOTE — H&P (Addendum)
History and Physical  Greenley Beucler FAO:[id number] DOB: 04-22-1981 DOA: 09/27/2023  Referring physician: Dr. Wilkie Aye, EDP  PCP: Jettie Pagan, NP  Outpatient Specialists: Nephrology, hematology oncology, gynecology. Patient coming from: Home.  Chief Complaint: Legs swelling  HPI: Sherri Dixon is a 42 y.o. 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  Associated with worsening diffuse pruritus.  Denies chest pain or palpitations.  Endorses taking Aleve for minor aches and pains.  She presented to the ED for further evaluation.  In the ED, volume overload on exam and hypertensive.  Lab studies revealed creatinine 12.57 with GFR of 3, serum bicarb 12 with anion gap of 12.  Hemoglobin 6.7, WBC 11.6, platelet count 278.  Noncontrast CT abdomen pelvis revealed postsurgical changes in the upper right kidney, no evidence of recurrent or metastatic disease in the abdomen and pelvis.  Hepatomegaly.  Small volume abdominal pelvic ascites.  Generalized subcutaneous body wall edema.  Moderate circumferential pericardial effusion.  Mild wall thickening about the anterior bladder may be due to distention or cystitis, recommended correlation with urinalysis.  Right lower ventral abdominal wall hernia containing fat and small amount of ascites.  Aortic atherosclerosis.    EDP discussed the case with nephrology on-call who recommended admission by the medicine team and  transfer to [facility] for further management of acute on chronic CKD 4.   ED Course: Temperature 98.8.  BP 180/76, pulse 94, respiratory 23, O2 saturation 100% on room air.  Review of Systems: Review of systems as noted in the HPI. All other systems reviewed and are negative.   Past Medical History:  Diagnosis Date   Anemia    Asthma    CHF (congestive heart failure) (HCC)    Pt reports that father has CHF, not her   Diabetes mellitus without complication (HCC)    Dyslipidemia    GERD (gastroesophageal reflux disease)    Pulmonary embolism (HCC)    Stroke [facility])    Past Surgical History:  Procedure Laterality Date   APPLICATION OF WOUND VAC Right 04/12/2021   Procedure: APPLICATION OF WOUND VAC;  Surgeon: Terance Hart, MD;  Location: [facility] OR;  Service: Orthopedics;  Laterality: Right;   I & D EXTREMITY Right 04/06/2021   Procedure: IRRIGATION AND DEBRIDEMENT TOE WITH WOUND VAC PLACEMENT;  Surgeon: Terance Hart, MD;  Location: [facility] OR;  Service: Orthopedics;  Laterality: Right;   I & D EXTREMITY Right 04/12/2021   Procedure: IRRIGATION AND DEBRIDEMENT RIGHT FOOT;  Surgeon: Terance Hart, MD;  Location: [facility] OR;  Service: Orthopedics;  Laterality: Right;   MOUTH SURGERY     ROBOTIC ASSITED PARTIAL NEPHRECTOMY Right 07/28/2019   Procedure: XI ROBOTIC ASSITED PARTIAL NEPHRECTOMY;  Surgeon: Malen Gauze, MD;  Location: [facility] ORS;  Service: Urology;  Laterality: Right;  3 HRS   SECONDARY CLOSURE OF WOUND Right 06/18/2021   Procedure: SECONDARY CLOSURE OF WOUND tRANSMETATARSAL AMPUTAION;  Surgeon: Terance Hart, MD;  Location: [facility] OR;  Service: Orthopedics;  Laterality: Right;   TRANSMETATARSAL AMPUTATION Right 04/12/2021  Procedure: TRANSMETATARSAL AMPUTATION;  Surgeon: Terance Hart, MD;  Location: [facility] OR;  Service: Orthopedics;  Laterality: Right;    Social History:  reports that she has never smoked. She has never used smokeless tobacco. She  reports current alcohol use of about 1.0 standard drink of alcohol per week. She reports that she does not use drugs.   Allergies  Allergen Reactions   Amlodipine Swelling    Pelvis swells   Insulin Glargine Other (See Comments) and Hives   Humalog Kwikpen [Insulin Lispro] Other (See Comments)    75/50---swelling and ulcers in mouth     Family History  Problem Relation Age of Onset   High blood pressure Mother    Diabetes Mother    Arthritis Mother    Hyperlipidemia Mother    Heart disease Mother    High blood pressure Father    Hyperlipidemia Father    Dementia Father    High blood pressure Sister    Diabetes Brother    Hyperlipidemia Brother    High blood pressure Brother    Colon cancer Neg Hx    Stomach cancer Neg Hx    Esophageal cancer Neg Hx    Pancreatic cancer Neg Hx    Liver disease Neg Hx       Prior to Admission medications   Medication Sig Start Date End Date Taking? Authorizing Provider  carvedilol (COREG) 6.25 MG tablet Take by mouth. 08/04/22  Yes [provider]  hydrALAZINE (APRESOLINE) 100 MG tablet Take by mouth. 08/04/22  Yes [provider]  insulin degludec (TRESIBA FLEXTOUCH) 200 UNIT/ML FlexTouch Pen INJECT 62 UNITS SUBCUTANEOUSLY ONCE DAILY 11/19/22  Yes [provider]  isosorbide mononitrate (IMDUR) 30 MG 24 hr tablet Take by mouth. 08/05/22  Yes [provider]  MULTIPLE VITAMINS PO Take by mouth.   Yes [provider]  nitroGLYCERIN (NITROSTAT) 0.4 MG SL tablet Take by mouth. 04/17/19  Yes [provider]  NOVOLOG FLEXPEN 100 UNIT/ML FlexPen Inject 8 Units into the skin See admin instructions. INJECT 8UNITS INTO SKIN BEFORE MEALS*IF YOUR BLOOD GLUCOSE >200,THEN INJECT 12 UNITS   Yes [provider]  omeprazole (PRILOSEC) 20 MG capsule TAKE 1 CAPSULE (20 MG TOTAL) BY MOUTH 2 (TWO) TIMES DAILY BEFORE A MEAL. 04/14/23  Yes Glenford Bayley, NP  rosuvastatin (CRESTOR) 40 MG tablet Take 1  tablet (40 mg total) by mouth daily. 01/29/21  Yes Leroy Sea, MD  Vitamin D, Ergocalciferol, (DRISDOL) 1.25 MG (50000 UNIT) CAPS capsule Take 50,000 Units by mouth once a week. 03/19/21  Yes [provider]  XARELTO 20 MG TABS tablet Take 20 mg by mouth daily. 10/15/20  Yes [provider]  Continuous Blood Gluc Receiver (DEXCOM G6 RECEIVER) DEVI by Does not apply route. Patient not taking: Reported on 11/13/2022 08/13/22   [provider]  Continuous Blood Gluc Sensor (DEXCOM G6 SENSOR) MISC by Does not apply route. Patient not taking: Reported on 11/13/2022 08/13/22   [provider]  Continuous Blood Gluc Transmit (DEXCOM G6 TRANSMITTER) MISC by Does not apply route. Patient not taking: Reported on 11/13/2022 08/13/22   [provider]  glucose blood test strip Use as instructed 06/06/16   Dhungel, Nishant, MD  Insulin Syringe-Needle U-100 (INSULIN SYRINGE 1CC/28G) 28G X 1/2" 1 ML MISC 1 application by Does not apply route 3 (three) times daily before meals. 06/06/16   Dhungel, Theda Belfast, MD    Physical Exam: BP (!) 188/92   Pulse 90  Temp 98.1 F (36.7 C) (Oral)   Resp 16   LMP 09/25/2023   SpO2 100%   General: 42 y.o. year-old female well developed well nourished in no acute distress.  Alert and oriented x3. Cardiovascular: Regular rate and rhythm with no rubs or gallops.  No thyromegaly or JVD noted.  3+ pitting edema in lower extremities bilaterally. Respiratory: Clear to auscultation with no wheezes or rales. Good inspiratory effort. Abdomen: Soft nontender nondistended with normal bowel sounds x4 quadrants. Muskuloskeletal: No cyanosis or clubbing noted bilaterally Neuro: CN II-XII intact, strength, sensation, reflexes Skin: No ulcerative lesions noted or rashes Psychiatry: Judgement and insight appear normal. Mood is appropriate for condition and setting          Labs on Admission:  Basic Metabolic Panel: Recent Labs  Lab  09/27/23 2200  NA 138  K 4.9  CL 114*  CO2 12*  GLUCOSE 126*  BUN 67*  CREATININE 12.57*  CALCIUM 6.2*   Liver Function Tests: No results for input(s): "AST", "ALT", "ALKPHOS", "BILITOT", "PROT", "ALBUMIN" in the last 168 hours. No results for input(s): "LIPASE", "AMYLASE" in the last 168 hours. No results for input(s): "AMMONIA" in the last 168 hours. CBC: Recent Labs  Lab 09/27/23 2200  WBC 11.6*  HGB 6.7*  HCT 21.8*  MCV 91.6  PLT 278   Cardiac Enzymes: No results for input(s): "CKTOTAL", "CKMB", "CKMBINDEX", "TROPONINI" in the last 168 hours.  BNP (last 3 results) No results for input(s): "BNP" in the last 8760 hours.  ProBNP (last 3 results) No results for input(s): "PROBNP" in the last 8760 hours.  CBG: No results for input(s): "GLUCAP" in the last 168 hours.  Radiological Exams on Admission: CT ABDOMEN PELVIS WO CONTRAST Result Date: 09/28/2023 CLINICAL DATA:  Provided history: Kidney cancer, post nephrectomy, monitor EXAM: CT ABDOMEN AND PELVIS WITHOUT CONTRAST TECHNIQUE: Multidetector CT imaging of the abdomen and pelvis was performed following the standard protocol without IV contrast. RADIATION DOSE REDUCTION: This exam was performed according to the departmental dose-optimization program which includes automated exposure control, adjustment of the mA and/or kV according to patient size and/or use of iterative reconstruction technique. COMPARISON:  Renal ultrasound 06/15/2023, CT 11/08/2019 FINDINGS: Lower chest: The heart is mildly enlarged. Moderate circumferential pericardial effusion measuring up to 2.1 cm adjacent to the right ventricle. Mitral annulus calcifications. No pleural effusion. Hepatobiliary: No evidence of focal liver lesion on this unenhanced exam. The liver is enlarged spanning 21.3 cm cranial caudal. Gallbladder physiologically distended, no calcified stone. No biliary dilatation. Pancreas: No ductal dilatation or inflammation. Spleen: Normal in  size without focal abnormality. Adrenals/Urinary Tract: Normal adrenal glands. Contour deformity with probable postsurgical change in the upper right kidney. Simple appearing cyst in the mid right kidney, simple fluid density. No evidence of solid lesion. No hydronephrosis or renal calculi of either kidney. Decompressed ureters. Mild wall thickening about the anterior bladder. Stomach/Bowel: Unremarkable appearance of the stomach. Occasional fluid-filled small bowel in the pelvis. No obstruction or inflammation. Colonic redundancy with small to moderate volume of stool in the colon. Vascular/Lymphatic: Normal caliber abdominal aorta. Mild but age advanced aortic atherosclerosis. No enlarged lymph nodes. Prominent ileocolic node at 9 mm is unchanged from prior CT. Reproductive: Suspected anterior fundal fibroid. Ovaries are not well-defined. No suspicious adnexal mass. Other: Small volume abdominopelvic ascites. Right lower ventral abdominal wall hernia containing fat and small amount of ascites. There is generalized subcutaneous body wall edema. Musculoskeletal: No focal bone lesion.  No acute osseous finding. IMPRESSION:  1. Postsurgical change in the upper right kidney. No evidence of recurrent or metastatic disease in the abdomen/pelvis. 2. Hepatomegaly. 3. Small volume abdominopelvic ascites. Generalized subcutaneous body wall edema. 4. Moderate circumferential pericardial effusion. 5. Mild wall thickening about the anterior bladder, may be due to underdistention or cystitis. Recommend correlation with urinalysis. 6. Right lower ventral abdominal wall hernia containing fat and small amount of ascites. Aortic Atherosclerosis (ICD10-I70.0). Electronically Signed   By: Narda Rutherford M.D.   On: 09/28/2023 02:58    EKG: I independently viewed the EKG done and my findings are as followed: Sinus rhythm rate of 97.  Nonspecific ST-T changes with QTc 487.  Assessment/Plan Present on Admission:  Acute renal  failure (ARF) (HCC)  Principal Problem:   Acute renal failure (ARF) (HCC)  AKI on CKD 4, possibly multifactorial Nephrology consulted No NSAIDs (was taking Aleve prior to admission for minor aches and pain.) Continue to avoid nephrotoxic agents and hypotension. Monitor urine output with strict I's and O's  Moderate pericardial effusion seen on CT scan Follow 2D echo and consult cardiology in the morning. Denies having any anginal symptoms at this time. Nephrology consulted for possible hemodialysis  Non anion gap metabolic acidosis likely secondary to acute renal insufficiency Presented with serum bicarb 12, anion gap 12 Follow VBG for pH and pCO2.  Anasarca likely secondary to acute renal insufficiency Nephrology consulted to assist with the management  Hypocalcemia Serum calcium 6.2 1 g IV calcium gluconate x 1 Electrolytes managed with hemodialysis Defer further management to nephrology  Acute blood loss anemia in the setting of recent heavy menses History of menorrhagia, followed by gynecology and hematology oncology Presented with hemoglobin of 6.7 1 unit PRBCs ordered in the ED to be transfused. Repeat CBC post blood transfusion.  History of bilateral PE/DVT on Xarelto chronically Hold off home Xarelto for now due to acute blood loss anemia requiring blood transfusion- consider Eliquis in the future due to advanced kidney disease No SCDs until bilateral lower extremity DVT is ruled out Follow bilateral lower extremity Doppler ultrasound  Obesity Weight 105 kg Recommend weight loss outpatient with regular physical activity and healthy dieting  General pruritus suspect from possible uremia Treat underlying conditions Supportive care with as needed Benadryl  Hypertension BP is not at goal, elevated Resume home oral antihypertensives Closely monitor vital signs.  Type 2 diabetes with hyperlipidemia Last hemoglobin A1c 8.7 on 04/08/2021 Repeat hemoglobin  A1c Resume home insulin when no longer NPO. N.p.o. as recommended by nephrology.   Critical care time: 65 minutes.   DVT prophylaxis: Home Xarelto on hold due to acute blood loss anemia requiring blood transfusion.  Hold off SCDs until bilateral lower extremity DVT is ruled out.  Follow bilateral lower extremity Doppler ultrasound.  Code Status: Full code.  Family Communication: None at bedside.  Disposition Plan: Admitted to progressive care unit  Consults called: Nephrology.  Admission status: Inpatient status.   Status is: Inpatient The patient requires at least 2 midnights for further evaluation and treatment of present condition.   Darlin Drop MD [facility] Pager [phone]  If 7PM-7AM, please contact night-coverage [email or web address] Password [facility]  09/28/2023, 4:34 AM

## 2023-09-28 NOTE — Progress Notes (Signed)
Lower extremity venous duplex completed. Please see CV Procedures for preliminary results.  Shona Simpson, RVT 09/28/23 9:27 AM

## 2023-09-28 NOTE — Progress Notes (Signed)
  Echocardiogram 2D Echocardiogram has been performed.  Sherri Dixon 09/28/2023, 12:26 PM

## 2023-09-28 NOTE — Consult Note (Signed)
Reason for Consult: AKI/CKD stage IV Referring Physician: Alanda Slim, MD  Sherri Dixon is an 42 y.o. 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  She also complained of fatigue.  In the ED, Temp 98.7, Bp 162/84, HR 64, SpO2 100%.  Labs notable for BUN 67, Cr 12.57, Co2 12, gluc 126, Cl 114, Ca 6.2, hgb 6.7, WBC 11.6.  Ct scan of abdomen and pelvis revealed post-surgical changes in the upper right kidney, no evidence of recurrent or metastatic disease.  Hepatomegaly, small volume ascites, generalized subcutaneous body wall edema, moderate pericardial effusion.  She also had mild wall thickening of the anterior bladder.  She was admitted and we were consulted to further evaluate and manage her AKI/CKD stage IV.  The trend in Scr is seen below.   She has had some anorexia, dysgeusia, and weight loss.  She has been followed by Dr. Ronalee Belts at our office and her Scr has climbed over the last 6 months from baseline of 2.6-3 up to 4.2 in June 2024, then 6.51 on 06/09/23.  Her metolazone was stopped and her furosemide was decreased to 80 mg bid at her last appointment in September.  She cancelled her appointment on 09/10/23.  They did discuss the potential need for dialysis, however she wanted to hold off for as long as possible.  Trend in Creatinine: Creatinine, Ser  Date/Time Value Ref Range Status  09/28/2023 06:02 AM 12.38 (H) 0.44 - 1.00 mg/dL Final  16/06/9603 54:09 PM 12.57 (H) 0.44 - 1.00 mg/dL Final  81/19/1478 29:56 AM 6.96 (H)    03/18/2023 11:20 AM 4.32 (H)    01/14/2023 14:52 PM 2.89 (H)    01/12/2023 09:37 AM 3.21 (H)    08/13/2022 2.19 (H)    08/04/2022 2.36 (H)    07/23/2022 07:00 PM 1.94 (H) 0.44 - 1.00  mg/dL Final  21/30/8657 84:69 AM 3.09 (H) 0.44 - 1.00 mg/dL Final  62/95/2841 32:44 AM 1.07 0.40 - 1.20 mg/dL Final  10/01/7251 66:44 AM 0.90 0.44 - 1.00 mg/dL Final  03/47/4259 56:38 AM 1.21 (H) 0.44 - 1.00 mg/dL Final  75/64/3329 51:88 AM 1.24 (H) 0.44 - 1.00 mg/dL Final  41/66/0630 16:01 AM 1.21 (H) 0.44 - 1.00 mg/dL Final  09/32/3557 32:20 AM 1.02 (H) 0.44 - 1.00 mg/dL Final  25/42/7062 37:62 AM 1.08 (H) 0.44 - 1.00 mg/dL Final  83/15/1761 60:73 AM 1.10 (H) 0.44 - 1.00 mg/dL Final  71/02/2693 85:46 AM 1.09 (H) 0.44 - 1.00 mg/dL Final  27/11/5007 38:18 AM 1.43 (H) 0.44 - 1.00 mg/dL Final  29/93/7169 67:89 AM 1.50 (H) 0.44 - 1.00 mg/dL Final  38/06/1750 02:58 AM 1.34 (H) 0.44 - 1.00 mg/dL Final  52/77/8242 35:36 AM 1.43 (H) 0.44 - 1.00 mg/dL Final  14/43/1540 08:67 AM 1.64 (H) 0.44 - 1.00 mg/dL Final  61/95/0932 67:12 AM 1.81 (H) 0.44 - 1.00 mg/dL Final  45/80/9983 38:25 PM 1.89 (H) 0.44 - 1.00 mg/dL Final  05/39/7673 41:93 AM 1.84 (H) 0.44 - 1.00 mg/dL Final  79/10/4095 35:32 AM 1.65 (H) 0.44 - 1.00 mg/dL Final  99/24/2683 41:96 PM 1.49 (H) 0.44 - 1.00 mg/dL Final  22/29/7989 21:19 AM 1.19 (H) 0.44 - 1.00 mg/dL Final  41/74/0814 48:18 AM 1.17 (H) 0.44 -  1.00 mg/dL Final  64/40/3474 25:95 AM 1.20 (H) 0.44 - 1.00 mg/dL Final  63/87/5643 32:95 PM 1.12 (H) 0.44 - 1.00 mg/dL Final  18/84/1660 63:01 PM 1.10 (H) 0.44 - 1.00 mg/dL Final  60/06/9322 55:73 AM 1.04 (H) 0.44 - 1.00 mg/dL Final  22/10/5425 06:23 AM 1.00 0.44 - 1.00 mg/dL Final  76/28/3151 76:16 AM 1.08 (H) 0.44 - 1.00 mg/dL Final  07/37/1062 69:48 PM 1.04 (H) 0.44 - 1.00 mg/dL Final  54/62/7035 00:93 AM 1.34 (H) 0.44 - 1.00 mg/dL Final  81/82/9937 16:96 AM 1.26 (H) 0.44 - 1.00 mg/dL Final  78/93/8101 75:10 PM 0.75 0.44 - 1.00 mg/dL Final  25/85/2778 24:23 AM 0.58 0.44 - 1.00 mg/dL Final  53/61/4431 54:00 AM 0.57 0.44 - 1.00 mg/dL Final  86/76/1950 93:26 AM 0.63 0.44 - 1.00 mg/dL Final  71/24/5809 98:33 AM 0.60 0.44 - 1.00  mg/dL Final  82/50/5397 67:34 AM 0.68 0.44 - 1.00 mg/dL Final  19/37/9024 09:73 AM 0.57 0.44 - 1.00 mg/dL Final  53/29/9242 68:34 AM 0.66 0.44 - 1.00 mg/dL Final  19/62/2297 98:92 PM 0.61 0.44 - 1.00 mg/dL Final  11/94/1740 81:44 PM 0.60 0.44 - 1.00 mg/dL Final  81/85/6314 97:02 AM 0.89 0.44 - 1.00 mg/dL Final    PMH:   Past Medical History:  Diagnosis Date   Anemia    Asthma    CHF (congestive heart failure) (HCC)    Pt reports that father has CHF, not her   Diabetes mellitus without complication (HCC)    Dyslipidemia    GERD (gastroesophageal reflux disease)    Pulmonary embolism (HCC)    Stroke (HCC)     PSH:   Past Surgical History:  Procedure Laterality Date   APPLICATION OF WOUND VAC Right 04/12/2021   Procedure: APPLICATION OF WOUND VAC;  Surgeon: Terance Hart, MD;  Location: [facility] OR;  Service: Orthopedics;  Laterality: Right;   I & D EXTREMITY Right 04/06/2021   Procedure: IRRIGATION AND DEBRIDEMENT TOE WITH WOUND VAC PLACEMENT;  Surgeon: Terance Hart, MD;  Location: [facility] OR;  Service: Orthopedics;  Laterality: Right;   I & D EXTREMITY Right 04/12/2021   Procedure: IRRIGATION AND DEBRIDEMENT RIGHT FOOT;  Surgeon: Terance Hart, MD;  Location: [facility] OR;  Service: Orthopedics;  Laterality: Right;   MOUTH SURGERY     ROBOTIC ASSITED PARTIAL NEPHRECTOMY Right 07/28/2019   Procedure: XI ROBOTIC ASSITED PARTIAL NEPHRECTOMY;  Surgeon: Malen Gauze, MD;  Location: [facility] ORS;  Service: Urology;  Laterality: Right;  3 HRS   SECONDARY CLOSURE OF WOUND Right 06/18/2021   Procedure: SECONDARY CLOSURE OF WOUND tRANSMETATARSAL AMPUTAION;  Surgeon: Terance Hart, MD;  Location: [facility] [place] OR;  Service: Orthopedics;  Laterality: Right;   TRANSMETATARSAL AMPUTATION Right 04/12/2021   Procedure: TRANSMETATARSAL AMPUTATION;  Surgeon: Terance Hart, MD;  Location: [facility] OR;  Service: Orthopedics;  Laterality: Right;    Allergies:  Allergies  Allergen Reactions    Amlodipine Swelling    Pelvis swells   Insulin Glargine Other (See Comments) and Hives   Humalog Kwikpen [Insulin Lispro] Other (See Comments)    75/50---swelling and ulcers in mouth     Medications:   Prior to Admission medications   Medication Sig Start Date End Date Taking? Authorizing Provider  carvedilol (COREG) 6.25 MG tablet Take by mouth. 08/04/22  Yes [provider]  hydrALAZINE (APRESOLINE) 100 MG tablet Take by mouth. 08/04/22  Yes [provider]  insulin degludec (TRESIBA FLEXTOUCH) 200 UNIT/ML FlexTouch Pen INJECT 62  UNITS SUBCUTANEOUSLY ONCE DAILY 11/19/22  Yes [provider]  isosorbide mononitrate (IMDUR) 30 MG 24 hr tablet Take by mouth. 08/05/22  Yes [provider]  MULTIPLE VITAMINS PO Take by mouth.   Yes [provider]  nitroGLYCERIN (NITROSTAT) 0.4 MG SL tablet Take by mouth. 04/17/19  Yes [provider]  NOVOLOG FLEXPEN 100 UNIT/ML FlexPen Inject 8 Units into the skin See admin instructions. INJECT 8UNITS INTO SKIN BEFORE MEALS*IF YOUR BLOOD GLUCOSE >200,THEN INJECT 12 UNITS   Yes [provider]  omeprazole (PRILOSEC) 20 MG capsule TAKE 1 CAPSULE (20 MG TOTAL) BY MOUTH 2 (TWO) TIMES DAILY BEFORE A MEAL. 04/14/23  Yes Glenford Bayley, NP  rosuvastatin (CRESTOR) 40 MG tablet Take 1 tablet (40 mg total) by mouth daily. 01/29/21  Yes Leroy Sea, MD  Vitamin D, Ergocalciferol, (DRISDOL) 1.25 MG (50000 UNIT) CAPS capsule Take 50,000 Units by mouth once a week. 03/19/21  Yes [provider]  XARELTO 20 MG TABS tablet Take 20 mg by mouth daily. 10/15/20  Yes [provider]  Continuous Blood Gluc Receiver (DEXCOM G6 RECEIVER) DEVI by Does not apply route. Patient not taking: Reported on 11/13/2022 08/13/22   [provider]  Continuous Blood Gluc Sensor (DEXCOM G6 SENSOR) MISC by Does not apply route. Patient not taking: Reported on 11/13/2022 08/13/22   [provider]   Continuous Blood Gluc Transmit (DEXCOM G6 TRANSMITTER) MISC by Does not apply route. Patient not taking: Reported on 11/13/2022 08/13/22   [provider]  glucose blood test strip Use as instructed 06/06/16   Dhungel, Nishant, MD  Insulin Syringe-Needle U-100 (INSULIN SYRINGE 1CC/28G) 28G X 1/2" 1 ML MISC 1 application by Does not apply route 3 (three) times daily before meals. 06/06/16   Dhungel, Theda Belfast, MD    Inpatient medications:  carvedilol  6.25 mg Oral BID WC   hydrALAZINE  50 mg Oral Q8H   insulin aspart  0-6 Units Subcutaneous Q4H   isosorbide mononitrate  30 mg Oral Daily    Discontinued Meds:   Medications Discontinued During This Encounter  Medication Reason   rivaroxaban (XARELTO) tablet 20 mg    hydrALAZINE (APRESOLINE) tablet 50 mg     Social History:  reports that she has never smoked. She has never used smokeless tobacco. She reports current alcohol use of about 1.0 standard drink of alcohol per week. She reports that she does not use drugs.  Family History:   Family History  Problem Relation Age of Onset   High blood pressure Mother    Diabetes Mother    Arthritis Mother    Hyperlipidemia Mother    Heart disease Mother    High blood pressure Father    Hyperlipidemia Father    Dementia Father    High blood pressure Sister    Diabetes Brother    Hyperlipidemia Brother    High blood pressure Brother    Colon cancer Neg Hx    Stomach cancer Neg Hx    Esophageal cancer Neg Hx    Pancreatic cancer Neg Hx    Liver disease Neg Hx     Pertinent items are noted in HPI. Weight change:   Intake/Output Summary (Last 24 hours) at 09/28/2023 1150 Last data filed at 09/28/2023 0634 Gross per 24 hour  Intake 356 ml  Output --  Net 356 ml   BP (!) 165/81   Pulse 84   Temp 98.4 F (36.9 C) (Oral)   Resp 16  LMP 09/25/2023   SpO2 100%  Vitals:   09/28/23 0900 09/28/23 0930 09/28/23 0945 09/28/23 1021  BP: (!) 148/87 (!) 173/91 (!) 165/81   Pulse:  84 86 84   Resp:  16 16   Temp:    98.4 F (36.9 C)  TempSrc:    Oral  SpO2: 100% 100% 100%      General appearance: alert, cooperative, no distress, and moderately obese Head: Normocephalic, without obvious abnormality, atraumatic Eyes: negative findings: lids and lashes normal, conjunctivae and sclerae normal, and corneas clear Resp: clear to auscultation bilaterally Cardio: regular rate and rhythm, S1, S2 normal, no murmur, click, rub or gallop GI: soft, non-tender; bowel sounds normal; no masses,  no organomegaly Extremities: edema 1+ pitting edema bilateral lower extremities Neuro:  no asterixis.  Labs: Basic Metabolic Panel: Recent Labs  Lab 09/27/23 2200 09/28/23 0602  NA 138 136  K 4.9 4.2  CL 114* 111  CO2 12* 12*  GLUCOSE 126* 108*  BUN 67* 62*  CREATININE 12.57* 12.38*  ALBUMIN  --  2.4*  2.4*  CALCIUM 6.2* 6.2*  PHOS  --  8.6*   Liver Function Tests: Recent Labs  Lab 09/28/23 0602  AST 16  17  ALT 14  14  ALKPHOS 98  100  BILITOT 0.5  0.6  PROT 6.1*  6.0*  ALBUMIN 2.4*  2.4*   No results for input(s): "LIPASE", "AMYLASE" in the last 168 hours. No results for input(s): "AMMONIA" in the last 168 hours. CBC: Recent Labs  Lab 09/27/23 2200 09/28/23 0602  WBC 11.6* 12.6*  HGB 6.7* 8.2*  HCT 21.8* 25.2*  MCV 91.6 92.6  PLT 278 280   PT/INR: @LABRCNTIP (inr:5) Cardiac Enzymes: )No results for input(s): "CKTOTAL", "CKMB", "CKMBINDEX", "TROPONINI" in the last 168 hours. CBG: Recent Labs  Lab 09/28/23 1145  GLUCAP 74    Iron Studies: No results for input(s): "IRON", "TIBC", "TRANSFERRIN", "FERRITIN" in the last 168 hours.  Xrays/Other Studies: VAS [place] LOWER EXTREMITY VENOUS (DVT) Result Date: 09/28/2023  Lower Venous DVT Study Patient Name:  AMELEA KUKUK  Date of Exam:   09/28/2023 Medical Rec #: [id number]      Accession #:    [id number] Date of Birth: 1980-09-30       Patient Gender: F Patient Age:   82 years Exam Location:  [facility] Procedure:      VAS [place] LOWER EXTREMITY VENOUS (DVT) Referring Phys: Enid Derry HALL --------------------------------------------------------------------------------  Indications: Swelling, and Edema.  Risk Factors: Hx of PE obesity. Limitations: Body habitus. Comparison Study: Significant changes seen since previous exam 05/22/18. Performing Technologist: Shona Simpson  Examination Guidelines: A complete evaluation includes B-mode imaging, spectral Doppler, color Doppler, and power Doppler as needed of all accessible portions of each vessel. Bilateral testing is considered an integral part of a complete examination. Limited examinations for reoccurring indications may be performed as noted. The reflux portion of the exam is performed with the patient in reverse Trendelenburg.  +---------+---------------+---------+-----------+----------+-------------------+ RIGHT    CompressibilityPhasicitySpontaneityPropertiesThrombus Aging      +---------+---------------+---------+-----------+----------+-------------------+ CFV      Full           Yes      Yes                                      +---------+---------------+---------+-----------+----------+-------------------+ SFJ      Full                                                             +---------+---------------+---------+-----------+----------+-------------------+  FV Prox  Full                                                             +---------+---------------+---------+-----------+----------+-------------------+ FV Mid   Full                                                             +---------+---------------+---------+-----------+----------+-------------------+ FV DistalFull                                                             +---------+---------------+---------+-----------+----------+-------------------+ PFV      Full                                                              +---------+---------------+---------+-----------+----------+-------------------+ POP      Full           Yes      Yes                                      +---------+---------------+---------+-----------+----------+-------------------+ PTV                              Yes                  Not well visualized +---------+---------------+---------+-----------+----------+-------------------+ PERO                             Yes                  Not well visualized +---------+---------------+---------+-----------+----------+-------------------+   +---------+---------------+---------+-----------+----------+-------------------+ LEFT     CompressibilityPhasicitySpontaneityPropertiesThrombus Aging      +---------+---------------+---------+-----------+----------+-------------------+ CFV      Full           Yes      Yes                                      +---------+---------------+---------+-----------+----------+-------------------+ SFJ      Full                                                             +---------+---------------+---------+-----------+----------+-------------------+ FV Prox  Full                                                             +---------+---------------+---------+-----------+----------+-------------------+  FV Mid   Full                                                             +---------+---------------+---------+-----------+----------+-------------------+ FV DistalFull                                                             +---------+---------------+---------+-----------+----------+-------------------+ PFV      Full                                                             +---------+---------------+---------+-----------+----------+-------------------+ POP      Full           Yes      Yes                                      +---------+---------------+---------+-----------+----------+-------------------+  PTV                              Yes                  Not well visualized +---------+---------------+---------+-----------+----------+-------------------+ PERO                             Yes                  Not well visualized +---------+---------------+---------+-----------+----------+-------------------+     Summary: BILATERAL: - No evidence of deep vein thrombosis seen in the lower extremities, bilaterally. -No evidence of popliteal cyst, bilaterally.   *See table(s) above for measurements and observations. Electronically signed by Carolynn Sayers on 09/28/2023 at 10:51:02 AM.    Final    CT ABDOMEN PELVIS WO CONTRAST Result Date: 09/28/2023 CLINICAL DATA:  Provided history: Kidney cancer, post nephrectomy, monitor EXAM: CT ABDOMEN AND PELVIS WITHOUT CONTRAST TECHNIQUE: Multidetector CT imaging of the abdomen and pelvis was performed following the standard protocol without IV contrast. RADIATION DOSE REDUCTION: This exam was performed according to the departmental dose-optimization program which includes automated exposure control, adjustment of the mA and/or kV according to patient size and/or use of iterative reconstruction technique. COMPARISON:  Renal ultrasound 06/15/2023, CT 11/08/2019 FINDINGS: Lower chest: The heart is mildly enlarged. Moderate circumferential pericardial effusion measuring up to 2.1 cm adjacent to the right ventricle. Mitral annulus calcifications. No pleural effusion. Hepatobiliary: No evidence of focal liver lesion on this unenhanced exam. The liver is enlarged spanning 21.3 cm cranial caudal. Gallbladder physiologically distended, no calcified stone. No biliary dilatation. Pancreas: No ductal dilatation or inflammation. Spleen: Normal in size without focal abnormality. Adrenals/Urinary Tract: Normal adrenal glands. Contour deformity with probable postsurgical change in the upper right kidney. Simple appearing cyst in the mid right kidney, simple fluid density. No  evidence of solid  lesion. No hydronephrosis or renal calculi of either kidney. Decompressed ureters. Mild wall thickening about the anterior bladder. Stomach/Bowel: Unremarkable appearance of the stomach. Occasional fluid-filled small bowel in the pelvis. No obstruction or inflammation. Colonic redundancy with small to moderate volume of stool in the colon. Vascular/Lymphatic: Normal caliber abdominal aorta. Mild but age advanced aortic atherosclerosis. No enlarged lymph nodes. Prominent ileocolic node at 9 mm is unchanged from prior CT. Reproductive: Suspected anterior fundal fibroid. Ovaries are not well-defined. No suspicious adnexal mass. Other: Small volume abdominopelvic ascites. Right lower ventral abdominal wall hernia containing fat and small amount of ascites. There is generalized subcutaneous body wall edema. Musculoskeletal: No focal bone lesion.  No acute osseous finding. IMPRESSION: 1. Postsurgical change in the upper right kidney. No evidence of recurrent or metastatic disease in the abdomen/pelvis. 2. Hepatomegaly. 3. Small volume abdominopelvic ascites. Generalized subcutaneous body wall edema. 4. Moderate circumferential pericardial effusion. 5. Mild wall thickening about the anterior bladder, may be due to underdistention or cystitis. Recommend correlation with urinalysis. 6. Right lower ventral abdominal wall hernia containing fat and small amount of ascites. Aortic Atherosclerosis (ICD10-I70.0). Electronically Signed   By: Narda Rutherford M.D.   On: 09/28/2023 02:58     Assessment/Plan:  AKI/CKD stage IV vs rapidly progressive CKD stage V - It appears that she has had several episodes of AKI/CKD with ARB and SGLT-2 inhibitors in the past, but her Scr really started to climb when her diuretics were increased in March 2024. She has evidence of volume overload with anasarca with pleural effusions and small amount of ascites.  We discussed the role for IV diuretics, as well as the likelihood  that she will need to initiate dialysis during this hospitalization if her renal function does not improve.  She is having mild uremic symptoms with anorexia, dysgeusia, and refractory edema.  Will plan for IV lasix and follow UOP and Scr.  She will likely need to be transferred to [facility] for [facility] and initiation of dialysis if she does not respond.  No emergent indication for dialysis at this time but we did discuss the likely need to start.   Avoid nephrotoxic medications including NSAIDs and iodinated intravenous contrast exposure unless the latter is absolutely indicated.   Preferred narcotic agents for pain control are hydromorphone, fentanyl, and methadone. Morphine should not be used.  Avoid Baclofen and avoid oral sodium phosphate and magnesium citrate based laxatives / bowel preps.  Continue strict Input and Output monitoring. Will monitor the patient closely with you and intervene or adjust therapy as indicated by changes in clinical status/labs   Anasarca - has history of chronic diastolic CHF.  Repeat ECHO performed today and awaiting results.  IV lasix as above. Hypocalcemia - repleted with IV calcium.  She was started on calcitriol 0.25 mcg daily in September.  Her iPTH was 327 at that time.  Anemia of CKD stage IV-V - Hgb was 10.4 on 06/09/23.  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  H/o Right RCC s/p partial right nephrectomy 2020 - no evidence of recurrence or mets on CT of abd/pelvis. HTN/volume- as above continue with home bp meds and IV lasix for diuresis.  Metabolic acidosis - will restart po sodium bicarbonate.    Julien Nordmann Evaan Tidwell 09/28/2023, 11:50 AM

## 2023-09-28 NOTE — ED Notes (Signed)
6.2 calcium    report gave to Dr Alwyn Ren

## 2023-09-28 NOTE — Progress Notes (Signed)
PROGRESS NOTE  Sherri Dixon ION:[id number] DOB: 21-Sep-1981   PCP: Jettie Pagan, NP  Patient is from: Home.  Independently ambulates at baseline.  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  Patient reports frequent NSAID use for pain.  BP elevated.  UA with proteinuria.  Noncontrast CT A/P showed hepatomegaly, small volume ascites, generalized subcutaneous body wall edema, moderate circumferential pericardial effusion and mild wall thickening of the bladder.  Nephrology consulted.  1 unit of blood ordered, and admission requested.  Patient has been admitted to [facility] and waiting on bed for transfer  Subjective: Seen and examined earlier this morning.  No major events overnight of this morning.  No complaints.  Feels better in terms of pruritus and edema.  Blood pressure improved.  She denies chest pain, shortness of breath or dizziness.  Objective: Vitals:   09/28/23 0900 09/28/23 0930 09/28/23 0945 09/28/23 1021  BP: (!) 148/87 (!) 173/91 (!) 165/81   Pulse: 84 86 84   Resp:  16 16   Temp:    98.4 F (36.9 C)  TempSrc:    Oral  SpO2: 100% 100% 100%     Examination:  GENERAL: No apparent distress.  Nontoxic. HEENT: MMM.  Vision and hearing grossly intact.  NECK: Supple.  No apparent JVD.  RESP:  No IWOB.  Fair aeration bilaterally. CVS:  RRR. Heart sounds normal.  ABD/GI/GU: BS+. Abd soft, NTND.  MSK/EXT:  Moves extremities.  Right TMA.  1+ BLE edema. SKIN: no apparent skin lesion or wound NEURO: Awake, alert and oriented appropriately.   No apparent focal neuro deficit. PSYCH: Calm. Normal affect.   Procedures:  None  Microbiology summarized: None  Assessment and plan: AKI on CKD-5: Possibly multifactorial including NSAID, uncontrolled hypertension and anemia.  UA seems to show proteinuria.  Patient denies notable change in urine output after she came to the hospital.  Followed by nephrology outpatient.  No signs of obstruction on CT. Recent Labs    09/27/23 2200 09/28/23 0602  BUN 67* 62*  CREATININE 12.57* 12.38*  -Strict intake and output.   -Advised to avoid NSAIDs. -Manage anemia and hypertension as below -Nephrology consulted on admission, and will see patient. -N.p.o. pending nephrology evaluation -Monitor renal functions   Moderate pericardial effusion seen on CT scan: No clinical signs of tamponade.  Hemodynamically stable.  No cardiopulmonary symptoms. -Follow echocardiogram -Needs dialysis. -Check uric acid -Consider cardiology consult after echocardiogram   Non-anion gap metabolic acidosis: Likely due to renal failure. -Monitor renal failure as above   Anasarca likely secondary to acute renal insufficiency -Nephrology consulted to assist with the management   Hypocalcemia: Calcium 6.2 g after 1 g of IV calcium gluconate. -IV calcium gluconate 2 g -Nephrology on board   ABLA superimposed on anemia of renal disease: ABLA in the setting of menorrhagia, anticoagulation and NSAID use-followed by gynecology and hematology.  Transfused 1 unit with appropriate response.  Currently no bleeding.   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  She was told a PE/DVT was due to immobility at work suggesting provoked VTE.  Not sure if she needs to remain on full dose  anticoagulation -Continue holding Xarelto for now -May consider prophylactic dose of anticoagulation if she really needs to stay on anticoagulation.  Uncontrolled IDDM-2 with CKD-5, hyperglycemia and hyperlipidemia: A1c 10.0% on 4/17.  A1c ordered this morning but not reliable after blood transfusion -Start CBG monitoring and SSI-very sensitive every 4 hours while NPO   General pruritus suspect from possible uremia: Improved -Benadryl as needed   Uncontrolled hypertension with renal disease: Improved -Resume home antihypertensive meds-Coreg, Imdur and hydralazine (half home dose)   History of right RCC s/p partial nephrectomy in 2020-no evidence of recurrence on CT.  History of CVA: No residual deficit.  Stable -Continue home statin.           DVT prophylaxis:  SCD  Code Status: Full code Family Communication: None at bedside Level of care: Progressive Status is: Inpatient Remains inpatient appropriate because: Renal failure, pericardial effusion and anasarca   Final disposition: Likely home once medically stable Consultants:  Nephrology  55 minutes with more than 50% spent in reviewing records, counseling patient/family and coordinating care.   Sch Meds:  Scheduled Meds:  carvedilol  6.25 mg Oral BID WC   hydrALAZINE  50 mg Oral Q8H   insulin aspart  0-6 Units Subcutaneous Q4H   isosorbide mononitrate  30 mg Oral Daily   Continuous Infusions: PRN Meds:.acetaminophen, diphenhydrAMINE, hydrALAZINE, HYDROmorphone (DILAUDID) injection, oxyCODONE, polyethylene glycol, sodium chloride flush  Antimicrobials: Anti-infectives (From admission, onward)    None        I have personally reviewed the following labs and images: CBC: Recent Labs  Lab 09/27/23 2200 09/28/23 0602  WBC 11.6* 12.6*  HGB 6.7* 8.2*  HCT 21.8* 25.2*  MCV 91.6 92.6  PLT 278 280   BMP &GFR Recent Labs  Lab 09/27/23 2200 09/28/23 0602  NA 138 136  K 4.9 4.2  CL 114* 111  CO2 12*  12*  GLUCOSE 126* 108*  BUN 67* 62*  CREATININE 12.57* 12.38*  CALCIUM 6.2* 6.2*  MG  --  1.9  PHOS  --  8.6*   CrCl cannot be calculated (Unknown ideal weight.). Liver & Pancreas: Recent Labs  Lab 09/28/23 0602  AST 16  17  ALT 14  14  ALKPHOS 98  100  BILITOT 0.5  0.6  PROT 6.1*  6.0*  ALBUMIN 2.4*  2.4*   No results for input(s): "LIPASE", "AMYLASE" in the last 168 hours. No results for input(s): "AMMONIA" in the last 168 hours. Diabetic: No results for input(s): "HGBA1C" in the last 72 hours. No results for input(s): "GLUCAP" in the last 168 hours. Cardiac Enzymes: No results for input(s): "CKTOTAL", "CKMB", "CKMBINDEX", "TROPONINI" in the last 168 hours. No results for input(s): "PROBNP" in the last 8760 hours. Coagulation Profile: No results for input(s): "INR", "PROTIME" in the last 168 hours. Thyroid Function Tests: No results for input(s): "TSH", "T4TOTAL", "FREET4", "T3FREE", "THYROIDAB" in the last 72 hours. Lipid Profile: No results for input(s): "CHOL", "HDL", "LDLCALC", "TRIG", "CHOLHDL", "LDLDIRECT" in the last 72 hours. Anemia Panel: No results for input(s): "VITAMINB12", "FOLATE", "FERRITIN", "TIBC", "IRON", "RETICCTPCT" in the last 72 hours. Urine analysis:    Component Value  Date/Time   COLORURINE YELLOW 09/27/2023 0404   APPEARANCEUR HAZY (A) 09/27/2023 0404   LABSPEC 1.013 09/27/2023 0404   PHURINE 5.0 09/27/2023 0404   GLUCOSEU 50 (A) 09/27/2023 0404   HGBUR NEGATIVE 09/27/2023 0404   BILIRUBINUR NEGATIVE 09/27/2023 0404   KETONESUR NEGATIVE 09/27/2023 0404   PROTEINUR >=300 (A) 09/27/2023 0404   UROBILINOGEN 1.0 08/14/2009 0850   NITRITE NEGATIVE 09/27/2023 0404   LEUKOCYTESUR NEGATIVE 09/27/2023 0404   Sepsis Labs: Invalid input(s): "PROCALCITONIN", "LACTICIDVEN"  Microbiology: No results found for this or any previous visit (from the past 240 hours).  Radiology Studies: VAS [place] LOWER EXTREMITY VENOUS (DVT) Result Date:  09/28/2023  Lower Venous DVT Study Patient Name:  KASHAYLA NETTO  Date of Exam:   09/28/2023 Medical Rec #: [id number]      Accession #:    [id number] Date of Birth: 1981-05-15       Patient Gender: F Patient Age:   59 years Exam Location:  [facility] Procedure:      VAS [place] LOWER EXTREMITY VENOUS (DVT) Referring Phys: Enid Derry HALL --------------------------------------------------------------------------------  Indications: Swelling, and Edema.  Risk Factors: Hx of PE obesity. Limitations: Body habitus. Comparison Study: Significant changes seen since previous exam 05/22/18. Performing Technologist: Shona Simpson  Examination Guidelines: A complete evaluation includes B-mode imaging, spectral Doppler, color Doppler, and power Doppler as needed of all accessible portions of each vessel. Bilateral testing is considered an integral part of a complete examination. Limited examinations for reoccurring indications may be performed as noted. The reflux portion of the exam is performed with the patient in reverse Trendelenburg.  +---------+---------------+---------+-----------+----------+-------------------+ RIGHT    CompressibilityPhasicitySpontaneityPropertiesThrombus Aging      +---------+---------------+---------+-----------+----------+-------------------+ CFV      Full           Yes      Yes                                      +---------+---------------+---------+-----------+----------+-------------------+ SFJ      Full                                                             +---------+---------------+---------+-----------+----------+-------------------+ FV Prox  Full                                                             +---------+---------------+---------+-----------+----------+-------------------+ FV Mid   Full                                                             +---------+---------------+---------+-----------+----------+-------------------+ FV DistalFull                                                              +---------+---------------+---------+-----------+----------+-------------------+  PFV      Full                                                             +---------+---------------+---------+-----------+----------+-------------------+ POP      Full           Yes      Yes                                      +---------+---------------+---------+-----------+----------+-------------------+ PTV                              Yes                  Not well visualized +---------+---------------+---------+-----------+----------+-------------------+ PERO                             Yes                  Not well visualized +---------+---------------+---------+-----------+----------+-------------------+   +---------+---------------+---------+-----------+----------+-------------------+ LEFT     CompressibilityPhasicitySpontaneityPropertiesThrombus Aging      +---------+---------------+---------+-----------+----------+-------------------+ CFV      Full           Yes      Yes                                      +---------+---------------+---------+-----------+----------+-------------------+ SFJ      Full                                                             +---------+---------------+---------+-----------+----------+-------------------+ FV Prox  Full                                                             +---------+---------------+---------+-----------+----------+-------------------+ FV Mid   Full                                                             +---------+---------------+---------+-----------+----------+-------------------+ FV DistalFull                                                             +---------+---------------+---------+-----------+----------+-------------------+ PFV      Full                                                              +---------+---------------+---------+-----------+----------+-------------------+  POP      Full           Yes      Yes                                      +---------+---------------+---------+-----------+----------+-------------------+ PTV                              Yes                  Not well visualized +---------+---------------+---------+-----------+----------+-------------------+ PERO                             Yes                  Not well visualized +---------+---------------+---------+-----------+----------+-------------------+     Summary: BILATERAL: - No evidence of deep vein thrombosis seen in the lower extremities, bilaterally. -No evidence of popliteal cyst, bilaterally.   *See table(s) above for measurements and observations. Electronically signed by Carolynn Sayers on 09/28/2023 at 10:51:02 AM.    Final    CT ABDOMEN PELVIS WO CONTRAST Result Date: 09/28/2023 CLINICAL DATA:  Provided history: Kidney cancer, post nephrectomy, monitor EXAM: CT ABDOMEN AND PELVIS WITHOUT CONTRAST TECHNIQUE: Multidetector CT imaging of the abdomen and pelvis was performed following the standard protocol without IV contrast. RADIATION DOSE REDUCTION: This exam was performed according to the departmental dose-optimization program which includes automated exposure control, adjustment of the mA and/or kV according to patient size and/or use of iterative reconstruction technique. COMPARISON:  Renal ultrasound 06/15/2023, CT 11/08/2019 FINDINGS: Lower chest: The heart is mildly enlarged. Moderate circumferential pericardial effusion measuring up to 2.1 cm adjacent to the right ventricle. Mitral annulus calcifications. No pleural effusion. Hepatobiliary: No evidence of focal liver lesion on this unenhanced exam. The liver is enlarged spanning 21.3 cm cranial caudal. Gallbladder physiologically distended, no calcified stone. No biliary dilatation. Pancreas: No ductal dilatation or inflammation. Spleen:  Normal in size without focal abnormality. Adrenals/Urinary Tract: Normal adrenal glands. Contour deformity with probable postsurgical change in the upper right kidney. Simple appearing cyst in the mid right kidney, simple fluid density. No evidence of solid lesion. No hydronephrosis or renal calculi of either kidney. Decompressed ureters. Mild wall thickening about the anterior bladder. Stomach/Bowel: Unremarkable appearance of the stomach. Occasional fluid-filled small bowel in the pelvis. No obstruction or inflammation. Colonic redundancy with small to moderate volume of stool in the colon. Vascular/Lymphatic: Normal caliber abdominal aorta. Mild but age advanced aortic atherosclerosis. No enlarged lymph nodes. Prominent ileocolic node at 9 mm is unchanged from prior CT. Reproductive: Suspected anterior fundal fibroid. Ovaries are not well-defined. No suspicious adnexal mass. Other: Small volume abdominopelvic ascites. Right lower ventral abdominal wall hernia containing fat and small amount of ascites. There is generalized subcutaneous body wall edema. Musculoskeletal: No focal bone lesion.  No acute osseous finding. IMPRESSION: 1. Postsurgical change in the upper right kidney. No evidence of recurrent or metastatic disease in the abdomen/pelvis. 2. Hepatomegaly. 3. Small volume abdominopelvic ascites. Generalized subcutaneous body wall edema. 4. Moderate circumferential pericardial effusion. 5. Mild wall thickening about the anterior bladder, may be due to underdistention or cystitis. Recommend correlation with urinalysis. 6. Right lower ventral abdominal wall hernia containing fat and small amount of ascites. Aortic Atherosclerosis (ICD10-I70.0). Electronically Signed   By:  Narda Rutherford M.D.   On: 09/28/2023 02:58      Kelsea Mousel T. Sherrina Zaugg [facility]  If 7PM-7AM, please contact night-coverage [email or web address] 09/28/2023, 11:14 AM

## 2023-09-29 DIAGNOSIS — N179 Acute kidney failure, unspecified: Secondary | ICD-10-CM | POA: Diagnosis not present

## 2023-09-29 LAB — CBC
HCT: 22.4 % — ABNORMAL LOW (ref 36.0–46.0)
HCT: 23.4 % — ABNORMAL LOW (ref 36.0–46.0)
Hemoglobin: 7.2 g/dL — ABNORMAL LOW (ref 12.0–15.0)
Hemoglobin: 7.4 g/dL — ABNORMAL LOW (ref 12.0–15.0)
MCH: 28.6 pg (ref 26.0–34.0)
MCH: 28.2 pg (ref 26.0–34.0)
MCHC: 32.1 g/dL (ref 30.0–36.0)
MCHC: 31.6 g/dL (ref 30.0–36.0)
MCV: 88.9 fL (ref 80.0–100.0)
MCV: 89.3 fL (ref 80.0–100.0)
Platelets: 265 10*3/uL (ref 150–400)
Platelets: 290 10*3/uL (ref 150–400)
RBC: 2.52 MIL/uL — ABNORMAL LOW (ref 3.87–5.11)
RBC: 2.62 MIL/uL — ABNORMAL LOW (ref 3.87–5.11)
RDW: 14.3 % (ref 11.5–15.5)
RDW: 14.3 % (ref 11.5–15.5)
WBC: 12.8 10*3/uL — ABNORMAL HIGH (ref 4.0–10.5)
WBC: 14.4 10*3/uL — ABNORMAL HIGH (ref 4.0–10.5)
nRBC: 0 % (ref 0.0–0.2)
nRBC: 0 % (ref 0.0–0.2)

## 2023-09-29 LAB — RETICULOCYTES
Immature Retic Fract: 25.7 % — ABNORMAL HIGH (ref 2.3–15.9)
RBC.: 2.53 MIL/uL — ABNORMAL LOW (ref 3.87–5.11)
Retic Count, Absolute: 50.3 10*3/uL (ref 19.0–186.0)
Retic Ct Pct: 2 % (ref 0.4–3.1)

## 2023-09-29 LAB — VITAMIN B12: Vitamin B-12: 768 pg/mL (ref 180–914)

## 2023-09-29 LAB — GLUCOSE, CAPILLARY
Glucose-Capillary: 81 mg/dL (ref 70–99)
Glucose-Capillary: 113 mg/dL — ABNORMAL HIGH (ref 70–99)
Glucose-Capillary: 143 mg/dL — ABNORMAL HIGH (ref 70–99)
Glucose-Capillary: 109 mg/dL — ABNORMAL HIGH (ref 70–99)
Glucose-Capillary: 133 mg/dL — ABNORMAL HIGH (ref 70–99)
Glucose-Capillary: 126 mg/dL — ABNORMAL HIGH (ref 70–99)

## 2023-09-29 LAB — HIV ANTIBODY (ROUTINE TESTING W REFLEX): HIV Screen 4th Generation wRfx: NONREACTIVE

## 2023-09-29 LAB — COMPREHENSIVE METABOLIC PANEL
ALT: 13 U/L (ref 0–44)
AST: 15 U/L (ref 15–41)
Albumin: 2 g/dL — ABNORMAL LOW (ref 3.5–5.0)
Alkaline Phosphatase: 98 U/L (ref 38–126)
Anion gap: 12 (ref 5–15)
BUN: 66 mg/dL — ABNORMAL HIGH (ref 6–20)
CO2: 13 mmol/L — ABNORMAL LOW (ref 22–32)
Calcium: 6.7 mg/dL — ABNORMAL LOW (ref 8.9–10.3)
Chloride: 114 mmol/L — ABNORMAL HIGH (ref 98–111)
Creatinine, Ser: 13.08 mg/dL — ABNORMAL HIGH (ref 0.44–1.00)
GFR, Estimated: 3 mL/min — ABNORMAL LOW (ref >60)
Glucose, Bld: 80 mg/dL (ref 70–99)
Potassium: 4.7 mmol/L (ref 3.5–5.1)
Sodium: 139 mmol/L (ref 135–145)
Total Bilirubin: 0.6 mg/dL (ref 0.0–1.2)
Total Protein: 5.1 g/dL — ABNORMAL LOW (ref 6.5–8.1)

## 2023-09-29 LAB — BPAM RBC
Blood Product Expiration Date: 202501262359
ISSUE DATE / TIME: 202412300314
Unit Type and Rh: 6200

## 2023-09-29 LAB — TYPE AND SCREEN
ABO/RH(D): A POS
Antibody Screen: NEGATIVE
Unit division: 0

## 2023-09-29 LAB — FOLATE: Folate: 17 ng/mL (ref >5.9)

## 2023-09-29 LAB — IRON AND TIBC
Iron: 42 ug/dL (ref 28–170)
Saturation Ratios: 35 % — ABNORMAL HIGH (ref 10.4–31.8)
TIBC: 120 ug/dL — ABNORMAL LOW (ref 250–450)
UIBC: 78 ug/dL

## 2023-09-29 LAB — FERRITIN: Ferritin: 171 ng/mL (ref 11–307)

## 2023-09-29 LAB — PHOSPHORUS: Phosphorus: 9.2 mg/dL — ABNORMAL HIGH (ref 2.5–4.6)

## 2023-09-29 LAB — MAGNESIUM: Magnesium: 1.8 mg/dL (ref 1.7–2.4)

## 2023-09-29 MED ORDER — ROSUVASTATIN CALCIUM 5 MG PO TABS
5.0000 mg | ORAL_TABLET | Freq: Every day | ORAL | Status: DC
  Administered 2023-09-29 – 2023-10-06 (×8): 5 mg via ORAL
  Filled 2023-09-29 (×8): qty 1

## 2023-09-29 MED ORDER — PROCHLORPERAZINE EDISYLATE 10 MG/2ML IJ SOLN
10.0000 mg | Freq: Four times a day (QID) | INTRAMUSCULAR | Status: DC | PRN
  Administered 2023-09-30 – 2023-10-01 (×2): 10 mg via INTRAVENOUS
  Filled 2023-09-29 (×2): qty 2

## 2023-09-29 MED ORDER — AQUAPHOR EX OINT
TOPICAL_OINTMENT | Freq: Two times a day (BID) | CUTANEOUS | Status: DC | PRN
  Administered 2023-10-05: 1 via TOPICAL
  Filled 2023-09-29 (×2): qty 50

## 2023-09-29 MED ORDER — HYDROCERIN EX CREA
TOPICAL_CREAM | Freq: Two times a day (BID) | CUTANEOUS | Status: DC
  Administered 2023-09-29 – 2023-10-05 (×4): 1 via TOPICAL
  Filled 2023-09-29: qty 113

## 2023-09-29 MED ORDER — ONDANSETRON HCL 4 MG/2ML IJ SOLN
4.0000 mg | Freq: Four times a day (QID) | INTRAMUSCULAR | Status: DC | PRN
  Administered 2023-09-29 – 2023-10-06 (×3): 4 mg via INTRAVENOUS
  Filled 2023-09-29 (×3): qty 2

## 2023-09-29 NOTE — Progress Notes (Signed)
 [facility] PROGRESS NOTE  Denyla Cortese (DOB: 15-Jun-1981) FMW:[id number] PCP: Arby Lyle LABOR, NP  Brief Narrative: Sherri Dixon is a 42 y.o. 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 She was volume overloaded and hypertensive in the ED with AKI (Cr 12.57) with acidosis (bicarb 12), also anemic with hgb 6.7g/dl. CT abd/pelvis demonstrated small volume ascites, diffuse anasarca, pericardial effusion which was confirmed to be moderate without tamponade physiology by echo. Nephrology was consulted, IV lasix  started and patient admitted to [facility] to be prepared if HD initiation is required.   Subjective: Feels nauseated and dry heaving this morning, itching all over body is the same, not too much UOP thus far.   Objective: BP (!) 181/90 (BP Location: Left Arm)   Pulse (!) 107   Temp 98 F (36.7 C) (Oral)   Resp 19   Wt 105.9 kg   LMP 09/25/2023   SpO2 100%   BMI 38.85 kg/m   Gen: Chronically ill-appearing female in no acute distress Pulm: Clear, nonlabored  CV: RRR, distant but with distinct S1S2, no rub, no definitive JVD (limited sensitivity due to habitus), diffuse pitting edema including abdominal wall. GI: Soft, NT, ND, +BS Neuro: Alert and oriented, no tremor. No new focal deficits. Ext: Warm, dry, R foot s/p TMA Skin: Diffusely dry skin with scattered excoriations. No ulcers or draining wounds.   Assessment & Plan: AKI on stage IV CKD with NAGMA: Suspect NSAID-induced injury and/or progressive CKD (has been rising Cr for months). UA bland, 6-10 WBCs, no RBCs, no mention of casts. FENa is 4.7%.  - Nephrology on board. She seems to have uremic symptoms and volume overload which we're attempting to treat with lasix  120mg  IV q6h with limited UOP (500cc  thus far).   - Note anterior bladder wall thickening and leukocytosis without fever, though pt does not report dysuria. No bacteriuria on UA micro. Not initiating abx at this time. - Continue bicarbonate tabs. - Renal diet   Anasarca, acute on chronic HFpEF: Echo with LVEF 60-65%, mod LVH, G1DD, and elevated estimate LVEDP. RV has normal size/function, normal PASP.  - Lasix  as above.   Acute on chronic blood loss anemia and anemia of CKD: No longer menstruating.  - Hgb up to 7.4g/dl today s/p 1u RBCs 87/69. Will continue monitoring, keep T&S up to date. Defer ESA to nephrology. Anemia panel has resulted but appears to be posttransfusion specimen.  - Endometrial ablation said to be planned after diabetes better controlled.  Pericardial effusion: No RV collapse on echo, pt actually hypertensive. Up to 1.9cm by echo. IVC not plethoric/dilated. Has had small pericardial effusion dating at least to April 2022. - Consider repeat limited echo after either renal function improves or HD has been initiated.   History of DVT/PE: LE venous U/S without DVT this admission. No hypoxemia, pleuritic CP and normal est. PASP by echo.  - Anticipate need to transition to eliquis , currently holding xarelto  and giving ppx heparin  in preparation for possible TDC  T2DM with peripheral neuropathy: HbA1c is 6.1%, though this appears to be from posttransfusion specimen, and is falsely depressed in setting of anemia anyway. PCP note shows HbA1c 8.6% in June 2024 at which time Cr was 4.3. - Continue SSI  HTN: With significant HTN here.  - Continue  coreg , hydralazine , isosorbide  with goal of gradual BP reduction.   Hypocalcemia: Supplemented.   Pruritus:  - prn antihistamine, prn topical eucerin and/or aquaphor.  - Definitive management may be HD.  Nausea:  - prn zofran , prn compazine .   History of CVA:  - Continue statin (dose decreased for renal impairment)  History of right RCC s/p partial nephrectomy: CT  showed no recurrence or signs of metastases.   Obesity: Body mass index is 38.85 kg/m.   Bernardino KATHEE Come, MD [facility] [email or web address] 09/29/2023, 1:47 PM

## 2023-09-29 NOTE — Progress Notes (Signed)
   09/29/23 1237  TOC Brief Assessment  Insurance and Status Reviewed (Medicare A and B)  Patient has primary care physician Yes Bonnita, Brooke A, NP)  Home environment has been reviewed From home with Spouse  Prior level of function: somewhat independent  Prior/Current Home Services No current home services  Social Drivers of Health Review SDOH reviewed no interventions necessary  Readmission risk has been reviewed Yes (24%)  Transition of care needs no transition of care needs at this time   TOC following for needs

## 2023-09-29 NOTE — Progress Notes (Signed)
 Brief nephrolgy progress note  Dr. Bryn reached out that patient at [facility] after presenting to [facility] with progressive CKD vs AKI on CKD.    Today labs about the same, K 4.7, HCO3 13 and SCr 13. UOP is < 1L on IV lasix .   BPs up/down, on RA.    Possiby will need to start HD.  Make NPO p MN for potential access in AM after day nephrolgy makes assessment.

## 2023-09-29 NOTE — Plan of Care (Signed)
  Problem: Coping: Goal: Ability to adjust to condition or change in health will improve Outcome: Progressing   Problem: Health Behavior/Discharge Planning: Goal: Ability to manage health-related needs will improve Outcome: Progressing   Problem: Clinical Measurements: Goal: Will remain free from infection Outcome: Progressing   Problem: Coping: Goal: Level of anxiety will decrease Outcome: Progressing   Problem: Pain Management: Goal: General experience of comfort will improve Outcome: Progressing

## 2023-09-30 ENCOUNTER — Inpatient Hospital Stay (HOSPITAL_COMMUNITY): Payer: 59

## 2023-09-30 DIAGNOSIS — N179 Acute kidney failure, unspecified: Secondary | ICD-10-CM | POA: Diagnosis not present

## 2023-09-30 LAB — HEPATITIS B SURFACE ANTIGEN: Hepatitis B Surface Ag: NONREACTIVE

## 2023-09-30 LAB — CBC WITH DIFFERENTIAL/PLATELET
Abs Immature Granulocytes: 0.13 10*3/uL — ABNORMAL HIGH (ref 0.00–0.07)
Basophils Absolute: 0 10*3/uL (ref 0.0–0.1)
Basophils Relative: 0 %
Eosinophils Absolute: 1.5 10*3/uL — ABNORMAL HIGH (ref 0.0–0.5)
Eosinophils Relative: 11 %
HCT: 22.4 % — ABNORMAL LOW (ref 36.0–46.0)
Hemoglobin: 7.1 g/dL — ABNORMAL LOW (ref 12.0–15.0)
Immature Granulocytes: 1 %
Lymphocytes Relative: 10 %
Lymphs Abs: 1.4 10*3/uL (ref 0.7–4.0)
MCH: 28.2 pg (ref 26.0–34.0)
MCHC: 31.7 g/dL (ref 30.0–36.0)
MCV: 88.9 fL (ref 80.0–100.0)
Monocytes Absolute: 1 10*3/uL (ref 0.1–1.0)
Monocytes Relative: 8 %
Neutro Abs: 9.8 10*3/uL — ABNORMAL HIGH (ref 1.7–7.7)
Neutrophils Relative %: 70 %
Platelets: 258 10*3/uL (ref 150–400)
RBC: 2.52 MIL/uL — ABNORMAL LOW (ref 3.87–5.11)
RDW: 14.3 % (ref 11.5–15.5)
WBC: 13.8 10*3/uL — ABNORMAL HIGH (ref 4.0–10.5)
nRBC: 0 % (ref 0.0–0.2)

## 2023-09-30 LAB — COMPREHENSIVE METABOLIC PANEL
ALT: 15 U/L (ref 0–44)
AST: 16 U/L (ref 15–41)
Albumin: 2.1 g/dL — ABNORMAL LOW (ref 3.5–5.0)
Alkaline Phosphatase: 99 U/L (ref 38–126)
Anion gap: 13 (ref 5–15)
BUN: 67 mg/dL — ABNORMAL HIGH (ref 6–20)
CO2: 13 mmol/L — ABNORMAL LOW (ref 22–32)
Calcium: 6.8 mg/dL — ABNORMAL LOW (ref 8.9–10.3)
Chloride: 115 mmol/L — ABNORMAL HIGH (ref 98–111)
Creatinine, Ser: 13.41 mg/dL — ABNORMAL HIGH (ref 0.44–1.00)
GFR, Estimated: 3 mL/min — ABNORMAL LOW (ref >60)
Glucose, Bld: 110 mg/dL — ABNORMAL HIGH (ref 70–99)
Potassium: 4.9 mmol/L (ref 3.5–5.1)
Sodium: 141 mmol/L (ref 135–145)
Total Bilirubin: 0.4 mg/dL (ref 0.0–1.2)
Total Protein: 5.1 g/dL — ABNORMAL LOW (ref 6.5–8.1)

## 2023-09-30 LAB — GLUCOSE, CAPILLARY
Glucose-Capillary: 109 mg/dL — ABNORMAL HIGH (ref 70–99)
Glucose-Capillary: 95 mg/dL (ref 70–99)
Glucose-Capillary: 96 mg/dL (ref 70–99)
Glucose-Capillary: 101 mg/dL — ABNORMAL HIGH (ref 70–99)
Glucose-Capillary: 131 mg/dL — ABNORMAL HIGH (ref 70–99)

## 2023-09-30 LAB — IRON AND TIBC
Iron: 58 ug/dL (ref 28–170)
Saturation Ratios: 44 % — ABNORMAL HIGH (ref 10.4–31.8)
TIBC: 132 ug/dL — ABNORMAL LOW (ref 250–450)
UIBC: 74 ug/dL

## 2023-09-30 LAB — RETICULOCYTES
Immature Retic Fract: 30.3 % — ABNORMAL HIGH (ref 2.3–15.9)
RBC.: 2.67 MIL/uL — ABNORMAL LOW (ref 3.87–5.11)
Retic Count, Absolute: 64.1 10*3/uL (ref 19.0–186.0)
Retic Ct Pct: 2.4 % (ref 0.4–3.1)

## 2023-09-30 LAB — FERRITIN: Ferritin: 190 ng/mL (ref 11–307)

## 2023-09-30 LAB — MAGNESIUM: Magnesium: 1.9 mg/dL (ref 1.7–2.4)

## 2023-09-30 LAB — PROTIME-INR
INR: 1.1 (ref 0.8–1.2)
Prothrombin Time: 14.9 s (ref 11.4–15.2)

## 2023-09-30 LAB — FOLATE: Folate: 15.3 ng/mL (ref >5.9)

## 2023-09-30 LAB — PHOSPHORUS: Phosphorus: 9.4 mg/dL — ABNORMAL HIGH (ref 2.5–4.6)

## 2023-09-30 LAB — VITAMIN B12: Vitamin B-12: 700 pg/mL (ref 180–914)

## 2023-09-30 LAB — BRAIN NATRIURETIC PEPTIDE: B Natriuretic Peptide: 717.8 pg/mL — ABNORMAL HIGH (ref 0.0–100.0)

## 2023-09-30 MED ORDER — CHLORHEXIDINE GLUCONATE CLOTH 2 % EX PADS
6.0000 | MEDICATED_PAD | Freq: Every day | CUTANEOUS | Status: DC
  Administered 2023-10-02 – 2023-10-04 (×3): 6 via TOPICAL

## 2023-09-30 NOTE — Progress Notes (Signed)
 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 She has evidence of volume overload with anasarca with pleural effusions and small amount of ascites.  We discussed the role for IV diuretics, as well as the likelihood that she will need to initiate dialysis during this hospitalization if her renal function does not improve.  She is having mild uremic symptoms with anorexia, dysgeusia, and refractory edema.  Will plan for IV lasix  and follow UOP and Scr.    - NPO past MN  - TDC ordered  - HD #1 orders written Anasarca - has history of chronic diastolic CHF.  Repeat ECHO performed today and awaiting results.  IV lasix  as above. Hypocalcemia - repleted with IV calcium .  She was started on calcitriol  0.25 mcg daily in September.  Her iPTH was 327 at that time.  Anemia of CKD stage IV-V - Hgb was 10.4 on 06/09/23.  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  H/o Right RCC s/p partial right nephrectomy 2020 - no evidence of recurrence or mets on CT of abd/pelvis. HTN/volume- as above continue with home bp meds and IV lasix  for diuresis.  Metabolic acidosis - will restart po sodium bicarbonate .   Subjective:    Seen in room.  Itchy.  Some UOP with IV lasix .  Long discussion re: dialysis today.  Willing to do [facility] and proceed.     Objective:   BP (!) 158/69 (BP Location: Left Arm)   Pulse 92   Temp 97.8 F (36.6 C) (Oral)   Resp 16   Wt 107.2 kg   LMP 09/25/2023   SpO2 100%   BMI 39.33 kg/m   Intake/Output Summary (Last 24 hours) at 09/30/2023 1209 Last data filed at 09/30/2023 0800 Gross per 24 hour  Intake 790 ml  Output 1100  ml  Net -310 ml   Weight change: 1.3 kg  Physical Exam: Gen:NAD, lying in bed CVS: RRR Resp: clear Abd: soft Ext: 3+ anasarca  Imaging: DG Chest Port 1 View Result Date: 09/30/2023 CLINICAL DATA:  Shortness of breath.  CHF. EXAM: PORTABLE CHEST 1 VIEW COMPARISON:  12/12/2020 FINDINGS: The cardio pericardial silhouette is enlarged. The lungs are clear without focal pneumonia, edema, pneumothorax or pleural effusion. No acute bony abnormality. IMPRESSION: Enlargement of the cardiopericardial silhouette without acute cardiopulmonary findings. Electronically Signed   By: Camellia Candle M.D.   On: 09/30/2023 08:58   ECHOCARDIOGRAM COMPLETE Result Date: 09/28/2023    ECHOCARDIOGRAM REPORT   Patient Name:   Sherri Dixon Date of Exam: 09/28/2023 Medical Rec #:  [id number]     Height:       65.0 in Accession #:    [id number]    Weight:       232.0 lb Date of Birth:  [birth year]      BSA:          2.107 m Patient Age:    42 years      BP:           165/81 mmHg Patient Gender: F  HR:           85 bpm. Exam Location:  Inpatient Procedure: 2D Echo, Cardiac Doppler and Color Doppler Indications:    abnormal ecg, pericardial effusion  History:        Patient has prior history of Echocardiogram examinations, most                 recent 12/04/2020. Risk Factors:Diabetes.  Sonographer:    Ozell Free Referring Phys: [id number] CAROLE N HALL IMPRESSIONS  1. Left ventricular ejection fraction, by estimation, is 60 to 65%. The left ventricle has normal function. The left ventricle has no regional wall motion abnormalities. There is moderate concentric left ventricular hypertrophy. Left ventricular diastolic parameters are consistent with Grade I diastolic dysfunction (impaired relaxation). Elevated left ventricular end-diastolic pressure.  2. Right ventricular systolic function is normal. The right ventricular size is normal. There is normal pulmonary artery systolic pressure.  3. Left atrial size was severely  dilated.  4. Pericardial effusion measuring up to 1.9 cm. Moderate pericardial effusion. There is no evidence of cardiac tamponade.  5. The mitral valve is normal in structure. No evidence of mitral valve regurgitation. No evidence of mitral stenosis.  6. The aortic valve is tricuspid. Aortic valve regurgitation is not visualized. No aortic stenosis is present.  7. The inferior vena cava is normal in size with greater than 50% respiratory variability, suggesting right atrial pressure of 3 mmHg. FINDINGS  Left Ventricle: Left ventricular ejection fraction, by estimation, is 60 to 65%. The left ventricle has normal function. The left ventricle has no regional wall motion abnormalities. The left ventricular internal cavity size was normal in size. There is  moderate concentric left ventricular hypertrophy. Left ventricular diastolic parameters are consistent with Grade I diastolic dysfunction (impaired relaxation). Elevated left ventricular end-diastolic pressure. Right Ventricle: The right ventricular size is normal. No increase in right ventricular wall thickness. Right ventricular systolic function is normal. There is normal pulmonary artery systolic pressure. The tricuspid regurgitant velocity is 2.08 m/s, and  with an assumed right atrial pressure of 3 mmHg, the estimated right ventricular systolic pressure is 20.3 mmHg. Left Atrium: Left atrial size was severely dilated. Right Atrium: Right atrial size was normal in size. Pericardium: Pericardial effusion measuring up to 1.9 cm. A moderately sized pericardial effusion is present. There is excessive respiratory variation in the tricuspid valve spectral Doppler velocities and diastolic collapse of the right atrial wall. There is no evidence of cardiac tamponade. Mitral Valve: The mitral valve is normal in structure. No evidence of mitral valve regurgitation. No evidence of mitral valve stenosis. MV peak gradient, 10.0 mmHg. The mean mitral valve gradient is 5.0  mmHg. Tricuspid Valve: The tricuspid valve is normal in structure. Tricuspid valve regurgitation is trivial. No evidence of tricuspid stenosis. Aortic Valve: The aortic valve is tricuspid. Aortic valve regurgitation is not visualized. No aortic stenosis is present. Aortic valve mean gradient measures 5.0 mmHg. Aortic valve peak gradient measures 8.1 mmHg. Aortic valve area, by VTI measures 2.48 cm. Pulmonic Valve: The pulmonic valve was normal in structure. Pulmonic valve regurgitation is not visualized. No evidence of pulmonic stenosis. Aorta: The aortic root is normal in size and structure. Venous: The inferior vena cava is normal in size with greater than 50% respiratory variability, suggesting right atrial pressure of 3 mmHg. IAS/Shunts: No atrial level shunt detected by color flow Doppler.  LEFT VENTRICLE PLAX 2D LVIDd:         4.80 cm   Diastology  LVIDs:         2.90 cm   LV e' medial:    3.81 cm/s LV PW:         1.50 cm   LV E/e' medial:  32.8 LV IVS:        1.60 cm   LV e' lateral:   4.57 cm/s LVOT diam:     2.00 cm   LV E/e' lateral: 27.4 LV SV:         80 LV SV Index:   38 LVOT Area:     3.14 cm  RIGHT VENTRICLE             IVC RV Basal diam:  4.10 cm     IVC diam: 1.80 cm RV S prime:     12.50 cm/s TAPSE (M-mode): 2.3 cm LEFT ATRIUM             Index        RIGHT ATRIUM           Index LA diam:        4.70 cm 2.23 cm/m   RA Area:     16.70 cm LA Vol (A2C):   87.1 ml 41.34 ml/m  RA Volume:   40.30 ml  19.13 ml/m LA Vol (A4C):   61.3 ml 29.09 ml/m LA Biplane Vol: 79.9 ml 37.92 ml/m  AORTIC VALVE AV Area (Vmax):    2.52 cm AV Area (Vmean):   2.19 cm AV Area (VTI):     2.48 cm AV Vmax:           142.00 cm/s AV Vmean:          111.000 cm/s AV VTI:            0.323 m AV Peak Grad:      8.1 mmHg AV Mean Grad:      5.0 mmHg LVOT Vmax:         114.00 cm/s LVOT Vmean:        77.400 cm/s LVOT VTI:          0.255 m LVOT/AV VTI ratio: 0.79  AORTA Ao Root diam: 2.80 cm Ao Asc diam:  3.20 cm MITRAL VALVE                 TRICUSPID VALVE MV Area (PHT): 2.91 cm     TR Peak grad:   17.3 mmHg MV Area VTI:   2.23 cm     TR Vmax:        208.00 cm/s MV Peak grad:  10.0 mmHg MV Mean grad:  5.0 mmHg     SHUNTS MV Vmax:       1.58 m/s     Systemic VTI:  0.26 m MV Vmean:      104.0 cm/s   Systemic Diam: 2.00 cm MV Decel Time: 261 msec MV E velocity: 125.00 cm/s MV A velocity: 140.00 cm/s MV E/A ratio:  0.89 Annabella Scarce MD Electronically signed by Annabella Scarce MD Signature Date/Time: 09/28/2023/3:42:48 PM    Final     Labs: BMET Recent Labs  Lab 09/27/23 2200 09/28/23 0602 09/29/23 0450 09/30/23 0527  NA 138 136 139 141  K 4.9 4.2 4.7 4.9  CL 114* 111 114* 115*  CO2 12* 12* 13* 13*  GLUCOSE 126* 108* 80 110*  BUN 67* 62* 66* 67*  CREATININE 12.57* 12.38* 13.08* 13.41*  CALCIUM  6.2* 6.2* 6.7* 6.8*  PHOS  --  8.6* 9.2* 9.4*   CBC  Recent Labs  Lab 09/28/23 0602 09/29/23 0450 09/29/23 1232 09/30/23 0527  WBC 12.6* 12.8* 14.4* 13.8*  NEUTROABS  --   --   --  9.8*  HGB 8.2* 7.2* 7.4* 7.1*  HCT 25.2* 22.4* 23.4* 22.4*  MCV 92.6 88.9 89.3 88.9  PLT 280 265 290 258    Medications:     carvedilol   6.25 mg Oral BID WC   heparin  injection (subcutaneous)  5,000 Units Subcutaneous Q8H   hydrALAZINE   50 mg Oral Q8H   hydrocerin   Topical BID   insulin  aspart  0-6 Units Subcutaneous Q4H   isosorbide  mononitrate  30 mg Oral Daily   rosuvastatin   5 mg Oral Daily   sodium bicarbonate   650 mg Oral TID    Almarie Bonine MD 09/30/2023, 12:09 PM

## 2023-09-30 NOTE — Plan of Care (Signed)
  Problem: Coping: Goal: Ability to adjust to condition or change in health will improve Outcome: Progressing   Problem: Skin Integrity: Goal: Risk for impaired skin integrity will decrease Outcome: Progressing   Problem: Education: Goal: Knowledge of General Education information will improve Description: Including pain rating scale, medication(s)/side effects and non-pharmacologic comfort measures Outcome: Progressing   Problem: Clinical Measurements: Goal: Will remain free from infection Outcome: Progressing   Problem: Coping: Goal: Level of anxiety will decrease Outcome: Progressing

## 2023-09-30 NOTE — Progress Notes (Signed)
 PROGRESS NOTE                                                                                                                                                                                                             Patient Demographics:    Sherri Dixon, is a 43 y.o. female, DOB - 02/26/1981, FMW:[id number]  Outpatient Primary MD for the patient is Arby Lyle LABOR, NP    LOS - 2  Admit date - 09/27/2023    Chief Complaint  Patient presents with   Leg Swelling       Brief Narrative (HPI from H&P)   43 y.o. 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 She was volume overloaded and hypertensive in the ED with AKI (Cr 12.57) with acidosis (bicarb 12), also anemic with hgb 6.7g/dl. CT abd/pelvis demonstrated small volume ascites, diffuse anasarca, pericardial effusion which was confirmed to be moderate without tamponade physiology by echo. Nephrology was consulted, IV lasix  started and patient admitted to [facility] to be prepared if HD initiation is required.    Subjective:    Sherri Dixon today has, No headache, No chest pain, No abdominal pain - No Nausea, No new weakness tingling or numbness, no SOB   Assessment  & Plan :    AKI on stage IV CKD with NAGMA: Suspect NSAID-induced injury and/or progressive CKD (has been rising Cr for months). UA bland, 6-10 WBCs, no RBCs, no mention of casts. FENa is 4.7%.  Neurology on board, being treated with high-dose IV Lasix , CT abdomen pelvis nonacute, may require initiation of HD.  Will defer to nephrology.    Anasarca, acute on chronic HFpEF: Echo with LVEF 60-65%, mod LVH, G1DD, and elevated estimate LVEDP. RV has normal size/function, normal PASP.  See above.    Acute on chronic blood loss anemia and anemia of CKD: No longer menstruating.  Hgb up to 7.4g/dl today s/p 1u RBCs 87/69. Will continue monitoring, keep T&S up to date. Defer ESA to nephrology. Anemia panel has resulted but appears to be posttransfusion specimen.  Agreeable for transfusion if needed in the hospital setting, type screen done.  Endometrial  ablation said to be planned after diabetes better controlled.   Pericardial effusion: No RV collapse on echo, pt actually hypertensive. Up to 1.9cm by echo. IVC not plethoric/dilated. Has had small pericardial effusion dating at least to April 2022.  Could have uremic pericarditis, repeat echo in 2 to 3 weeks.   History of DVT/PE: LE venous U/S without DVT this admission. No hypoxemia, pleuritic CP and normal est. PASP by echo.   Anticipate need to transition to eliquis , currently holding xarelto  and giving ppx heparin  in preparation for possible TDC   HTN: With significant HTN here.  Continue coreg , hydralazine , isosorbide  with goal of gradual BP reduction.    Hypocalcemia: Supplemented.    Pruritus:  - prn antihistamine, prn topical eucerin and/or aquaphor.  Definitive management may be HD.   Nausea:  - prn zofran , prn compazine .    History of CVA:  - Continue statin (dose decreased for renal impairment)   History of right RCC s/p partial nephrectomy: CT showed no recurrence or signs of metastases.    Obesity: Body mass index is 38.85 kg/m.  Follow-up with PCP.  T2DM with peripheral neuropathy: HbA1c is 6.1% -  though this appears to be from posttransfusion specimen, and is falsely depressed in setting of anemia anyway. PCP note shows HbA1c 8.6% in June 2024 at which time Cr was 4.3.  - Continue SSI  CBG (last 3)  Recent Labs    09/29/23 2329 09/30/23 0355 09/30/23 0802  GLUCAP 126* 109* 95   Lab Results  Component Value Date   HGBA1C 6.1 (H) 09/28/2023         Condition - Fair  Family Communication  :  None  Code  Status :  Full  Consults  :  Renal  PUD Prophylaxis :    Procedures  :     TTE -  1. Left ventricular ejection fraction, by estimation, is 60 to 65%. The left ventricle has normal function. The left ventricle has no regional wall motion abnormalities. There is moderate concentric left ventricular hypertrophy. Left ventricular diastolic parameters are consistent with Grade I diastolic dysfunction (impaired relaxation). Elevated left ventricular end-diastolic pressure.  2. Right ventricular systolic function is normal. The right ventricular size is normal. There is normal pulmonary artery systolic pressure.  3. Left atrial size was severely dilated.  4. Pericardial effusion measuring up to 1.9 cm. Moderate pericardial effusion. There is no evidence of cardiac tamponade.  5. The mitral valve is normal in structure. No evidence of mitral valve regurgitation. No evidence of mitral stenosis.  6. The aortic valve is tricuspid. Aortic valve regurgitation is not visualized. No aortic stenosis is present.  7. The inferior vena cava is normal in size with greater than 50% respiratory variability, suggesting right atrial pressure of 3 mmHg.  CT - 1. Postsurgical change in the upper right kidney. No evidence of recurrent or metastatic disease in the abdomen/pelvis. 2. Hepatomegaly. 3. Small volume abdominopelvic ascites. Generalized subcutaneous body wall edema. 4. Moderate circumferential pericardial effusion. 5. Mild wall thickening about the anterior bladder, may be due to underdistention or cystitis. Recommend correlation with urinalysis. 6. Right lower ventral abdominal wall hernia containing fat and small amount of ascites. Aortic Atherosclerosis (ICD10-I70.0)      Disposition Plan  :    Status is: Inpatient   DVT Prophylaxis  :    heparin  injection 5,000 Units Start: 09/28/23 1500 Place and maintain sequential compression device Start: 09/28/23 1115    Lab Results  Component  Value Date   PLT 258  09/30/2023    Diet :  Diet Order             Diet NPO time specified  Diet effective midnight                    Inpatient Medications  Scheduled Meds:  carvedilol   6.25 mg Oral BID WC   heparin  injection (subcutaneous)  5,000 Units Subcutaneous Q8H   hydrALAZINE   50 mg Oral Q8H   hydrocerin   Topical BID   insulin  aspart  0-6 Units Subcutaneous Q4H   isosorbide  mononitrate  30 mg Oral Daily   rosuvastatin   5 mg Oral Daily   sodium bicarbonate   650 mg Oral TID   Continuous Infusions:  furosemide  120 mg (09/30/23 0459)   PRN Meds:.acetaminophen , diphenhydrAMINE , hydrALAZINE , mineral oil-hydrophilic petrolatum, ondansetron  (ZOFRAN ) IV, oxyCODONE , polyethylene glycol, prochlorperazine , sodium chloride  flush  Antibiotics  :    Anti-infectives (From admission, onward)    None         Objective:   Vitals:   09/30/23 0300 09/30/23 0456 09/30/23 0501 09/30/23 0805  BP:  (!) 162/109 (!) 162/109 (!) 158/69  Pulse:  90  92  Resp:  16  16  Temp:  97.6 F (36.4 C)  97.8 F (36.6 C)  TempSrc:  Oral  Oral  SpO2:  100%    Weight: 107.2 kg       Wt Readings from Last 3 Encounters:  09/30/23 107.2 kg  04/15/23 105.2 kg  03/10/23 108.9 kg     Intake/Output Summary (Last 24 hours) at 09/30/2023 0958 Last data filed at 09/30/2023 0453 Gross per 24 hour  Intake 790 ml  Output 1100 ml  Net -310 ml     Physical Exam  Awake Alert, No new F.N deficits, Normal affect Baker.AT,PERRAL Supple Neck, No JVD,   Symmetrical Chest wall movement, Good air movement bilaterally, CTAB RRR,No Gallops,Rubs or new Murmurs,  +ve B.Sounds, Abd Soft, No tenderness,   1+ edema         Data Review:    Recent Labs  Lab 09/27/23 2200 09/28/23 0602 09/29/23 0450 09/29/23 1232 09/30/23 0527  WBC 11.6* 12.6* 12.8* 14.4* 13.8*  HGB 6.7* 8.2* 7.2* 7.4* 7.1*  HCT 21.8* 25.2* 22.4* 23.4* 22.4*  PLT 278 280 265 290 258  MCV 91.6 92.6 88.9 89.3 88.9  MCH 28.2 30.1 28.6 28.2 28.2   MCHC 30.7 32.5 32.1 31.6 31.7  RDW 14.3 13.8 14.3 14.3 14.3  LYMPHSABS  --   --   --   --  1.4  MONOABS  --   --   --   --  1.0  EOSABS  --   --   --   --  1.5*  BASOSABS  --   --   --   --  0.0    Recent Labs  Lab 09/27/23 2200 09/28/23 0602 09/28/23 0654 09/29/23 0450 09/30/23 0527  NA 138 136  --  139 141  K 4.9 4.2  --  4.7 4.9  CL 114* 111  --  114* 115*  CO2 12* 12*  --  13* 13*  ANIONGAP 12 13  --  12 13  GLUCOSE 126* 108*  --  80 110*  BUN 67* 62*  --  66* 67*  CREATININE 12.57* 12.38*  --  13.08* 13.41*  AST  --  16  17  --  15 16  ALT  --  14  14  --  13 15  ALKPHOS  --  98  100  --  98 99  BILITOT  --  0.5  0.6  --  0.6 0.4  ALBUMIN   --  2.4*  2.4*  --  2.0* 2.1*  INR  --   --   --   --  1.1  HGBA1C  --   --  6.1*  --   --   BNP  --  378.5*  --   --  717.8*  MG  --  1.9  --  1.8 1.9  CALCIUM  6.2* 6.2*  --  6.7* 6.8*      Recent Labs  Lab 09/27/23 2200 09/28/23 0602 09/28/23 0654 09/29/23 0450 09/30/23 0527  INR  --   --   --   --  1.1  HGBA1C  --   --  6.1*  --   --   BNP  --  378.5*  --   --  717.8*  MG  --  1.9  --  1.8 1.9  CALCIUM  6.2* 6.2*  --  6.7* 6.8*    Lab Results  Component Value Date   HGBA1C 6.1 (H) 09/28/2023   No results for input(s): TSH, T4TOTAL, FREET4, T3FREE, THYROIDAB in the last 72 hours. Recent Labs    09/29/23 0450  VITAMINB12 768  FOLATE 17.0  FERRITIN 171  TIBC 120*  IRON 42  RETICCTPCT 2.0   ------------------------------------------------------------------------------------------------------------------ Cardiac Enzymes No results for input(s): CKMB, TROPONINI, MYOGLOBIN in the last 168 hours.  Invalid input(s): CK  Micro Results No results found for this or any previous visit (from the past 240 hours).  Radiology Reports DG Chest Port 1 View Result Date: 09/30/2023 CLINICAL DATA:  Shortness of breath.  CHF. EXAM: PORTABLE CHEST 1 VIEW COMPARISON:  12/12/2020 FINDINGS: The cardio  pericardial silhouette is enlarged. The lungs are clear without focal pneumonia, edema, pneumothorax or pleural effusion. No acute bony abnormality. IMPRESSION: Enlargement of the cardiopericardial silhouette without acute cardiopulmonary findings. Electronically Signed   By: Camellia Candle M.D.   On: 09/30/2023 08:58   ECHOCARDIOGRAM COMPLETE Result Date: 09/28/2023    ECHOCARDIOGRAM REPORT   Patient Name:   ANASOPHIA PECOR Date of Exam: 09/28/2023 Medical Rec #:  [id number]     Height:       65.0 in Accession #:    [id number]    Weight:       232.0 lb Date of Birth:  April 05, 1981      BSA:          2.107 m Patient Age:    42 years      BP:           165/81 mmHg Patient Gender: F             HR:           85 bpm. Exam Location:  Inpatient Procedure: 2D Echo, Cardiac Doppler and Color Doppler Indications:    abnormal ecg, pericardial effusion  History:        Patient has prior history of Echocardiogram examinations, most                 recent 12/04/2020. Risk Factors:Diabetes.  Sonographer:    Ozell Free Referring Phys: [id number] CAROLE N HALL IMPRESSIONS  1. Left ventricular ejection fraction, by estimation, is 60 to 65%. The left ventricle has normal function. The left ventricle has no regional wall motion abnormalities. There is moderate concentric left ventricular hypertrophy. Left  ventricular diastolic parameters are consistent with Grade I diastolic dysfunction (impaired relaxation). Elevated left ventricular end-diastolic pressure.  2. Right ventricular systolic function is normal. The right ventricular size is normal. There is normal pulmonary artery systolic pressure.  3. Left atrial size was severely dilated.  4. Pericardial effusion measuring up to 1.9 cm. Moderate pericardial effusion. There is no evidence of cardiac tamponade.  5. The mitral valve is normal in structure. No evidence of mitral valve regurgitation. No evidence of mitral stenosis.  6. The aortic valve is tricuspid. Aortic valve regurgitation is  not visualized. No aortic stenosis is present.  7. The inferior vena cava is normal in size with greater than 50% respiratory variability, suggesting right atrial pressure of 3 mmHg. FINDINGS  Left Ventricle: Left ventricular ejection fraction, by estimation, is 60 to 65%. The left ventricle has normal function. The left ventricle has no regional wall motion abnormalities. The left ventricular internal cavity size was normal in size. There is  moderate concentric left ventricular hypertrophy. Left ventricular diastolic parameters are consistent with Grade I diastolic dysfunction (impaired relaxation). Elevated left ventricular end-diastolic pressure. Right Ventricle: The right ventricular size is normal. No increase in right ventricular wall thickness. Right ventricular systolic function is normal. There is normal pulmonary artery systolic pressure. The tricuspid regurgitant velocity is 2.08 m/s, and  with an assumed right atrial pressure of 3 mmHg, the estimated right ventricular systolic pressure is 20.3 mmHg. Left Atrium: Left atrial size was severely dilated. Right Atrium: Right atrial size was normal in size. Pericardium: Pericardial effusion measuring up to 1.9 cm. A moderately sized pericardial effusion is present. There is excessive respiratory variation in the tricuspid valve spectral Doppler velocities and diastolic collapse of the right atrial wall. There is no evidence of cardiac tamponade. Mitral Valve: The mitral valve is normal in structure. No evidence of mitral valve regurgitation. No evidence of mitral valve stenosis. MV peak gradient, 10.0 mmHg. The mean mitral valve gradient is 5.0 mmHg. Tricuspid Valve: The tricuspid valve is normal in structure. Tricuspid valve regurgitation is trivial. No evidence of tricuspid stenosis. Aortic Valve: The aortic valve is tricuspid. Aortic valve regurgitation is not visualized. No aortic stenosis is present. Aortic valve mean gradient measures 5.0 mmHg. Aortic  valve peak gradient measures 8.1 mmHg. Aortic valve area, by VTI measures 2.48 cm. Pulmonic Valve: The pulmonic valve was normal in structure. Pulmonic valve regurgitation is not visualized. No evidence of pulmonic stenosis. Aorta: The aortic root is normal in size and structure. Venous: The inferior vena cava is normal in size with greater than 50% respiratory variability, suggesting right atrial pressure of 3 mmHg. IAS/Shunts: No atrial level shunt detected by color flow Doppler.  LEFT VENTRICLE PLAX 2D LVIDd:         4.80 cm   Diastology LVIDs:         2.90 cm   LV e' medial:    3.81 cm/s LV PW:         1.50 cm   LV E/e' medial:  32.8 LV IVS:        1.60 cm   LV e' lateral:   4.57 cm/s LVOT diam:     2.00 cm   LV E/e' lateral: 27.4 LV SV:         80 LV SV Index:   38 LVOT Area:     3.14 cm  RIGHT VENTRICLE             IVC RV Basal diam:  4.10 cm     IVC diam: 1.80 cm RV S prime:     12.50 cm/s TAPSE (M-mode): 2.3 cm LEFT ATRIUM             Index        RIGHT ATRIUM           Index LA diam:        4.70 cm 2.23 cm/m   RA Area:     16.70 cm LA Vol (A2C):   87.1 ml 41.34 ml/m  RA Volume:   40.30 ml  19.13 ml/m LA Vol (A4C):   61.3 ml 29.09 ml/m LA Biplane Vol: 79.9 ml 37.92 ml/m  AORTIC VALVE AV Area (Vmax):    2.52 cm AV Area (Vmean):   2.19 cm AV Area (VTI):     2.48 cm AV Vmax:           142.00 cm/s AV Vmean:          111.000 cm/s AV VTI:            0.323 m AV Peak Grad:      8.1 mmHg AV Mean Grad:      5.0 mmHg LVOT Vmax:         114.00 cm/s LVOT Vmean:        77.400 cm/s LVOT VTI:          0.255 m LVOT/AV VTI ratio: 0.79  AORTA Ao Root diam: 2.80 cm Ao Asc diam:  3.20 cm MITRAL VALVE                TRICUSPID VALVE MV Area (PHT): 2.91 cm     TR Peak grad:   17.3 mmHg MV Area VTI:   2.23 cm     TR Vmax:        208.00 cm/s MV Peak grad:  10.0 mmHg MV Mean grad:  5.0 mmHg     SHUNTS MV Vmax:       1.58 m/s     Systemic VTI:  0.26 m MV Vmean:      104.0 cm/s   Systemic Diam: 2.00 cm MV Decel Time: 261  msec MV E velocity: 125.00 cm/s MV A velocity: 140.00 cm/s MV E/A ratio:  0.89 Annabella Scarce MD Electronically signed by Annabella Scarce MD Signature Date/Time: 09/28/2023/3:42:48 PM    Final    VAS US  LOWER EXTREMITY VENOUS (DVT) Result Date: 09/28/2023  Lower Venous DVT Study Patient Name:  SHAKERRIA PARRAN  Date of Exam:   09/28/2023 Medical Rec #: [id number]      Accession #:    [id number] Date of Birth: 12-Jul-1981       Patient Gender: F Patient Age:   34 years Exam Location:  [facility] Procedure:      VAS US  LOWER EXTREMITY VENOUS (DVT) Referring Phys: TERRY HALL --------------------------------------------------------------------------------  Indications: Swelling, and Edema.  Risk Factors: Hx of PE obesity. Limitations: Body habitus. Comparison Study: Significant changes seen since previous exam 05/22/18. Performing Technologist: Garnette Rockers  Examination Guidelines: A complete evaluation includes B-mode imaging, spectral Doppler, color Doppler, and power Doppler as needed of all accessible portions of each vessel. Bilateral testing is considered an integral part of a complete examination. Limited examinations for reoccurring indications may be performed as noted. The reflux portion of the exam is performed with the patient in reverse Trendelenburg.  +---------+---------------+---------+-----------+----------+-------------------+ RIGHT    CompressibilityPhasicitySpontaneityPropertiesThrombus Aging      +---------+---------------+---------+-----------+----------+-------------------+ CFV      Full  Yes      Yes                                      +---------+---------------+---------+-----------+----------+-------------------+ SFJ      Full                                                             +---------+---------------+---------+-----------+----------+-------------------+ FV Prox  Full                                                              +---------+---------------+---------+-----------+----------+-------------------+ FV Mid   Full                                                             +---------+---------------+---------+-----------+----------+-------------------+ FV DistalFull                                                             +---------+---------------+---------+-----------+----------+-------------------+ PFV      Full                                                             +---------+---------------+---------+-----------+----------+-------------------+ POP      Full           Yes      Yes                                      +---------+---------------+---------+-----------+----------+-------------------+ PTV                              Yes                  Not well visualized +---------+---------------+---------+-----------+----------+-------------------+ PERO                             Yes                  Not well visualized +---------+---------------+---------+-----------+----------+-------------------+   +---------+---------------+---------+-----------+----------+-------------------+ LEFT     CompressibilityPhasicitySpontaneityPropertiesThrombus Aging      +---------+---------------+---------+-----------+----------+-------------------+ CFV      Full           Yes      Yes                                      +---------+---------------+---------+-----------+----------+-------------------+  SFJ      Full                                                             +---------+---------------+---------+-----------+----------+-------------------+ FV Prox  Full                                                             +---------+---------------+---------+-----------+----------+-------------------+ FV Mid   Full                                                             +---------+---------------+---------+-----------+----------+-------------------+ FV  DistalFull                                                             +---------+---------------+---------+-----------+----------+-------------------+ PFV      Full                                                             +---------+---------------+---------+-----------+----------+-------------------+ POP      Full           Yes      Yes                                      +---------+---------------+---------+-----------+----------+-------------------+ PTV                              Yes                  Not well visualized +---------+---------------+---------+-----------+----------+-------------------+ PERO                             Yes                  Not well visualized +---------+---------------+---------+-----------+----------+-------------------+     Summary: BILATERAL: - No evidence of deep vein thrombosis seen in the lower extremities, bilaterally. -No evidence of popliteal cyst, bilaterally.   *See table(s) above for measurements and observations. Electronically signed by Norman Serve on 09/28/2023 at 10:51:02 AM.    Final    CT ABDOMEN PELVIS WO CONTRAST Result Date: 09/28/2023 CLINICAL DATA:  Provided history: Kidney cancer, post nephrectomy, monitor EXAM: CT ABDOMEN AND PELVIS WITHOUT CONTRAST TECHNIQUE: Multidetector CT imaging of the abdomen and pelvis was performed following the standard protocol without IV contrast. RADIATION DOSE REDUCTION: This exam was performed according to the  departmental dose-optimization program which includes automated exposure control, adjustment of the mA and/or kV according to patient size and/or use of iterative reconstruction technique. COMPARISON:  Renal ultrasound 06/15/2023, CT 11/08/2019 FINDINGS: Lower chest: The heart is mildly enlarged. Moderate circumferential pericardial effusion measuring up to 2.1 cm adjacent to the right ventricle. Mitral annulus calcifications. No pleural effusion. Hepatobiliary: No evidence  of focal liver lesion on this unenhanced exam. The liver is enlarged spanning 21.3 cm cranial caudal. Gallbladder physiologically distended, no calcified stone. No biliary dilatation. Pancreas: No ductal dilatation or inflammation. Spleen: Normal in size without focal abnormality. Adrenals/Urinary Tract: Normal adrenal glands. Contour deformity with probable postsurgical change in the upper right kidney. Simple appearing cyst in the mid right kidney, simple fluid density. No evidence of solid lesion. No hydronephrosis or renal calculi of either kidney. Decompressed ureters. Mild wall thickening about the anterior bladder. Stomach/Bowel: Unremarkable appearance of the stomach. Occasional fluid-filled small bowel in the pelvis. No obstruction or inflammation. Colonic redundancy with small to moderate volume of stool in the colon. Vascular/Lymphatic: Normal caliber abdominal aorta. Mild but age advanced aortic atherosclerosis. No enlarged lymph nodes. Prominent ileocolic node at 9 mm is unchanged from prior CT. Reproductive: Suspected anterior fundal fibroid. Ovaries are not well-defined. No suspicious adnexal mass. Other: Small volume abdominopelvic ascites. Right lower ventral abdominal wall hernia containing fat and small amount of ascites. There is generalized subcutaneous body wall edema. Musculoskeletal: No focal bone lesion.  No acute osseous finding. IMPRESSION: 1. Postsurgical change in the upper right kidney. No evidence of recurrent or metastatic disease in the abdomen/pelvis. 2. Hepatomegaly. 3. Small volume abdominopelvic ascites. Generalized subcutaneous body wall edema. 4. Moderate circumferential pericardial effusion. 5. Mild wall thickening about the anterior bladder, may be due to underdistention or cystitis. Recommend correlation with urinalysis. 6. Right lower ventral abdominal wall hernia containing fat and small amount of ascites. Aortic Atherosclerosis (ICD10-I70.0). Electronically Signed   By:  Andrea Gasman M.D.   On: 09/28/2023 02:58      Signature  -   Lavada Stank M.D on 09/30/2023 at 9:58 AM   -  To page go to [email or web address]

## 2023-10-01 ENCOUNTER — Inpatient Hospital Stay (HOSPITAL_COMMUNITY): Payer: 59

## 2023-10-01 DIAGNOSIS — N179 Acute kidney failure, unspecified: Secondary | ICD-10-CM | POA: Diagnosis not present

## 2023-10-01 HISTORY — PX: IR FLUORO GUIDE CV LINE RIGHT: IMG2283

## 2023-10-01 HISTORY — PX: IR US GUIDE VASC ACCESS RIGHT: IMG2390

## 2023-10-01 LAB — CBC WITH DIFFERENTIAL/PLATELET
Abs Immature Granulocytes: 0.1 10*3/uL — ABNORMAL HIGH (ref 0.00–0.07)
Basophils Absolute: 0 10*3/uL (ref 0.0–0.1)
Basophils Relative: 0 %
Eosinophils Absolute: 1 10*3/uL — ABNORMAL HIGH (ref 0.0–0.5)
Eosinophils Relative: 7 %
HCT: 23.6 % — ABNORMAL LOW (ref 36.0–46.0)
Hemoglobin: 7.5 g/dL — ABNORMAL LOW (ref 12.0–15.0)
Immature Granulocytes: 1 %
Lymphocytes Relative: 4 %
Lymphs Abs: 0.6 10*3/uL — ABNORMAL LOW (ref 0.7–4.0)
MCH: 28.5 pg (ref 26.0–34.0)
MCHC: 31.8 g/dL (ref 30.0–36.0)
MCV: 89.7 fL (ref 80.0–100.0)
Monocytes Absolute: 0.8 10*3/uL (ref 0.1–1.0)
Monocytes Relative: 5 %
Neutro Abs: 13.3 10*3/uL — ABNORMAL HIGH (ref 1.7–7.7)
Neutrophils Relative %: 83 %
Platelets: 243 10*3/uL (ref 150–400)
RBC: 2.63 MIL/uL — ABNORMAL LOW (ref 3.87–5.11)
RDW: 14.4 % (ref 11.5–15.5)
WBC: 15.9 10*3/uL — ABNORMAL HIGH (ref 4.0–10.5)
nRBC: 0 % (ref 0.0–0.2)

## 2023-10-01 LAB — COMPREHENSIVE METABOLIC PANEL
ALT: 14 U/L (ref 0–44)
AST: 14 U/L — ABNORMAL LOW (ref 15–41)
Albumin: 2.1 g/dL — ABNORMAL LOW (ref 3.5–5.0)
Alkaline Phosphatase: 103 U/L (ref 38–126)
Anion gap: 16 — ABNORMAL HIGH (ref 5–15)
BUN: 68 mg/dL — ABNORMAL HIGH (ref 6–20)
CO2: 11 mmol/L — ABNORMAL LOW (ref 22–32)
Calcium: 6.4 mg/dL — CL (ref 8.9–10.3)
Chloride: 111 mmol/L (ref 98–111)
Creatinine, Ser: 13.85 mg/dL — ABNORMAL HIGH (ref 0.44–1.00)
GFR, Estimated: 3 mL/min — ABNORMAL LOW (ref >60)
Glucose, Bld: 119 mg/dL — ABNORMAL HIGH (ref 70–99)
Potassium: 4.8 mmol/L (ref 3.5–5.1)
Sodium: 138 mmol/L (ref 135–145)
Total Bilirubin: 0.7 mg/dL (ref 0.0–1.2)
Total Protein: 5.2 g/dL — ABNORMAL LOW (ref 6.5–8.1)

## 2023-10-01 LAB — CBC
HCT: 20.1 % — ABNORMAL LOW (ref 36.0–46.0)
HCT: 21.7 % — ABNORMAL LOW (ref 36.0–46.0)
Hemoglobin: 6.2 g/dL — CL (ref 12.0–15.0)
Hemoglobin: 6.9 g/dL — CL (ref 12.0–15.0)
MCH: 28.4 pg (ref 26.0–34.0)
MCH: 28.6 pg (ref 26.0–34.0)
MCHC: 30.8 g/dL (ref 30.0–36.0)
MCHC: 31.8 g/dL (ref 30.0–36.0)
MCV: 92.2 fL (ref 80.0–100.0)
MCV: 90 fL (ref 80.0–100.0)
Platelets: 216 10*3/uL (ref 150–400)
Platelets: 150 10*3/uL (ref 150–400)
RBC: 2.18 MIL/uL — ABNORMAL LOW (ref 3.87–5.11)
RBC: 2.41 MIL/uL — ABNORMAL LOW (ref 3.87–5.11)
RDW: 14.6 % (ref 11.5–15.5)
RDW: 14.5 % (ref 11.5–15.5)
WBC: 18.9 10*3/uL — ABNORMAL HIGH (ref 4.0–10.5)
WBC: 19.3 10*3/uL — ABNORMAL HIGH (ref 4.0–10.5)
nRBC: 0 % (ref 0.0–0.2)
nRBC: 0 % (ref 0.0–0.2)

## 2023-10-01 LAB — RENAL FUNCTION PANEL
Albumin: 1.7 g/dL — ABNORMAL LOW (ref 3.5–5.0)
Anion gap: 13 (ref 5–15)
BUN: 71 mg/dL — ABNORMAL HIGH (ref 6–20)
CO2: 12 mmol/L — ABNORMAL LOW (ref 22–32)
Calcium: 6.4 mg/dL — CL (ref 8.9–10.3)
Chloride: 113 mmol/L — ABNORMAL HIGH (ref 98–111)
Creatinine, Ser: 14.17 mg/dL — ABNORMAL HIGH (ref 0.44–1.00)
GFR, Estimated: 3 mL/min — ABNORMAL LOW (ref >60)
Glucose, Bld: 201 mg/dL — ABNORMAL HIGH (ref 70–99)
Phosphorus: 9.1 mg/dL — ABNORMAL HIGH (ref 2.5–4.6)
Potassium: 4.8 mmol/L (ref 3.5–5.1)
Sodium: 138 mmol/L (ref 135–145)

## 2023-10-01 LAB — GLUCOSE, CAPILLARY
Glucose-Capillary: 120 mg/dL — ABNORMAL HIGH (ref 70–99)
Glucose-Capillary: 181 mg/dL — ABNORMAL HIGH (ref 70–99)

## 2023-10-01 LAB — BRAIN NATRIURETIC PEPTIDE: B Natriuretic Peptide: 590.8 pg/mL — ABNORMAL HIGH (ref 0.0–100.0)

## 2023-10-01 LAB — PHOSPHORUS: Phosphorus: 9 mg/dL — ABNORMAL HIGH (ref 2.5–4.6)

## 2023-10-01 LAB — C-REACTIVE PROTEIN: CRP: 4.7 mg/dL — ABNORMAL HIGH (ref <1.0)

## 2023-10-01 LAB — PROCALCITONIN: Procalcitonin: 0.41 ng/mL

## 2023-10-01 LAB — MAGNESIUM: Magnesium: 2 mg/dL (ref 1.7–2.4)

## 2023-10-01 MED ORDER — LIDOCAINE HCL (PF) 1 % IJ SOLN
5.0000 mL | INTRAMUSCULAR | Status: DC | PRN
Start: 2023-10-01 — End: 2023-10-01

## 2023-10-01 MED ORDER — HEPARIN SODIUM (PORCINE) 1000 UNIT/ML IJ SOLN
INTRAMUSCULAR | Status: AC
  Filled 2023-10-01: qty 10

## 2023-10-01 MED ORDER — ALTEPLASE 2 MG IJ SOLR
2.0000 mg | Freq: Once | INTRAMUSCULAR | Status: AC | PRN
  Administered 2023-10-01: 2 mg
  Filled 2023-10-01: qty 2

## 2023-10-01 MED ORDER — NITROGLYCERIN 0.4 MG SL SUBL: 0.4000 mg | SUBLINGUAL_TABLET | SUBLINGUAL | Status: DC | PRN

## 2023-10-01 MED ORDER — PANTOPRAZOLE SODIUM 40 MG PO TBEC
40.0000 mg | DELAYED_RELEASE_TABLET | Freq: Every day | ORAL | Status: DC
  Administered 2023-10-01 – 2023-10-06 (×6): 40 mg via ORAL
  Filled 2023-10-01 (×6): qty 1

## 2023-10-01 MED ORDER — LIDOCAINE-PRILOCAINE 2.5-2.5 % EX CREA: 1.0000 | TOPICAL_CREAM | CUTANEOUS | Status: DC | PRN

## 2023-10-01 MED ORDER — HEPARIN SODIUM (PORCINE) 1000 UNIT/ML IJ SOLN
4000.0000 [IU] | Freq: Once | INTRAMUSCULAR | Status: AC
  Administered 2023-10-01: 2.6 mL via INTRAVENOUS

## 2023-10-01 MED ORDER — LIDOCAINE HCL 1 % IJ SOLN
INTRAMUSCULAR | Status: AC
Start: 2023-10-01 — End: ?
  Filled 2023-10-01: qty 20

## 2023-10-01 MED ORDER — CALCIUM GLUCONATE-NACL 2-0.675 GM/100ML-% IV SOLN
2.0000 g | Freq: Once | INTRAVENOUS | Status: AC
  Administered 2023-10-01: 2000 mg via INTRAVENOUS
  Filled 2023-10-01: qty 100

## 2023-10-01 MED ORDER — ANTICOAGULANT SODIUM CITRATE 4% (200MG/5ML) IV SOLN: 5.0000 mL | Status: DC | PRN

## 2023-10-01 MED ORDER — SODIUM CHLORIDE 0.9% FLUSH: 10.0000 mL | INTRAVENOUS | Status: DC | PRN

## 2023-10-01 MED ORDER — HEPARIN SODIUM (PORCINE) 1000 UNIT/ML DIALYSIS: 1000.0000 [IU] | INTRAMUSCULAR | Status: DC | PRN

## 2023-10-01 MED ORDER — ENOXAPARIN SODIUM 80 MG/0.8ML IJ SOSY
80.0000 mg | PREFILLED_SYRINGE | Freq: Every day | INTRAMUSCULAR | Status: DC
  Administered 2023-10-01 – 2023-10-04 (×4): 80 mg via SUBCUTANEOUS
  Filled 2023-10-01 (×4): qty 0.8

## 2023-10-01 MED ORDER — CHLORHEXIDINE GLUCONATE 4 % EX SOLN
CUTANEOUS | Status: AC
  Filled 2023-10-01: qty 15

## 2023-10-01 MED ORDER — DOCUSATE SODIUM 100 MG PO CAPS
200.0000 mg | ORAL_CAPSULE | Freq: Two times a day (BID) | ORAL | Status: DC
  Administered 2023-10-01 – 2023-10-06 (×8): 200 mg via ORAL
  Filled 2023-10-01 (×10): qty 2

## 2023-10-01 MED ORDER — ALTEPLASE 2 MG IJ SOLR
2.0000 mg | Freq: Once | INTRAMUSCULAR | Status: AC
  Administered 2023-10-01: 2 mg

## 2023-10-01 MED ORDER — LIDOCAINE HCL 1 % IJ SOLN
10.0000 mL | Freq: Once | INTRAMUSCULAR | Status: AC
  Administered 2023-10-01: 10 mL

## 2023-10-01 MED ORDER — PENTAFLUOROPROP-TETRAFLUOROETH EX AERO: 1.0000 | INHALATION_SPRAY | CUTANEOUS | Status: DC | PRN

## 2023-10-01 MED ORDER — HEPARIN SODIUM (PORCINE) 1000 UNIT/ML IJ SOLN
2.8000 mL | Freq: Once | INTRAMUSCULAR | Status: AC
  Administered 2023-10-01: 2800 [IU] via INTRAVENOUS

## 2023-10-01 MED ORDER — LIDOCAINE-EPINEPHRINE 1 %-1:100000 IJ SOLN
INTRAMUSCULAR | Status: AC
  Filled 2023-10-01: qty 1

## 2023-10-01 MED ORDER — HEPARIN SODIUM (PORCINE) 1000 UNIT/ML DIALYSIS
2000.0000 [IU] | Freq: Once | INTRAMUSCULAR | Status: AC
  Administered 2023-10-01: 2000 [IU] via INTRAVENOUS_CENTRAL

## 2023-10-01 MED ORDER — LIDOCAINE HCL (PF) 2 % IJ SOLN
INTRAMUSCULAR | Status: AC
  Filled 2023-10-01: qty 10

## 2023-10-01 NOTE — Progress Notes (Signed)
 IV Team consulted to assess HD dressing, dressing off upon entering room. HD catheter out from insertion site approximately 0.5cm. Site cleaned, stat lock applied and redressed under sterile procedure. Pigtail cap changed and flushed, no blood return present. Primary nurse made aware via secure chat.

## 2023-10-01 NOTE — Consult Note (Signed)
 Patient Status: [facility] - In-pt  Assessment and Plan: Patient in need of venous access for initiation of dialysis.  Patient with history of renal dysfunction, now with acute kidney injury in need of dialysis initiation.   Tunneled catheter requested, however patient with elevated WBC today to 15.1.   Discussed with Nephrology and TRH, ok to proceed with temporary catheter placement.   Risks and benefits discussed with the patient including, but not limited to bleeding, infection, vascular injury, pneumothorax which may require chest tube placement, air embolism or even death  All of the patient's questions were answered, patient is agreeable to proceed. Consent signed and in chart.  ______________________________________________________________________   History of Present Illness: Sherri Dixon is a 43 y.o. 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  She is in need of dialysis initiation. Venous access requested.   Allergies and medications reviewed.   Review of Systems: A 12 point ROS discussed and pertinent positives are indicated in the HPI above.  All other systems are negative.  Review of Systems  Constitutional:  Negative for fatigue and fever.  Respiratory:  Negative for cough and shortness of breath.   Cardiovascular:  Negative for chest pain.  Gastrointestinal:  Negative for abdominal pain, nausea and vomiting.  Musculoskeletal:  Negative for back pain.  Psychiatric/Behavioral:  Negative for behavioral problems and confusion.     Vital Signs: BP 119/62   Pulse 97   Temp 99 F (37.2 C) (Oral)   Resp 14   Wt 226 lb 13.7 oz (102.9 kg)   LMP 09/25/2023   SpO2 100%   BMI 37.75 kg/m   Physical Exam Vitals and nursing note  reviewed.  Constitutional:      General: She is not in acute distress.    Appearance: Normal appearance. She is not ill-appearing.  HENT:     Mouth/Throat:     Mouth: Mucous membranes are moist.     Pharynx: Oropharynx is clear.  Cardiovascular:     Rate and Rhythm: Normal rate and regular rhythm.  Pulmonary:     Effort: Pulmonary effort is normal.     Breath sounds: Normal breath sounds.  Abdominal:     General: Abdomen is flat.     Palpations: Abdomen is soft.  Skin:    General: Skin is warm and dry.  Neurological:     General: No focal deficit present.     Mental Status: She is alert and oriented to person, place, and time. Mental status is at baseline.  Psychiatric:        Mood and Affect: Mood normal.        Behavior: Behavior normal.        Thought Content: Thought content normal.        Judgment: Judgment normal.      Imaging reviewed.   Labs:  COAGS: Recent Labs    09/30/23 0527  INR 1.1    BMP: Recent Labs    09/28/23 0602 09/29/23 0450 09/30/23 0527 10/01/23 0447  NA 136 139 141 138  K 4.2 4.7 4.9 4.8  CL 111 114* 115* 111  CO2 12* 13* 13* 11*  GLUCOSE 108* 80 110* 119*  BUN 62* 66* 67* 68*  CALCIUM  6.2* 6.7* 6.8* 6.4*  CREATININE 12.38* 13.08* 13.41*  13.85*  GFRNONAA 3* 3* 3* 3*       Electronically Signed: Jennise Both Sue-Ellen Yazmyne Sara, PA 10/01/2023, 9:29 AM   I spent a total of 15 minutes in face to face in clinical consultation, greater than 50% of which was counseling/coordinating care for venous access.

## 2023-10-01 NOTE — Progress Notes (Signed)
 [facility] Progress Note   Assessment/ Plan:    AKI/CKD stage IV vs rapidly progressive CKD stage V - It appears that she has had several episodes of AKI/CKD with ARB and SGLT-2 inhibitors in the past, but her Scr really started to climb when her diuretics were increased in March 2024. She has evidence of volume overload with anasarca with pleural effusions and small amount of ascites.  We discussed the role for IV diuretics, as well as the likelihood that she will need to initiate dialysis during this hospitalization if her renal function does not improve.  She is having mild uremic symptoms with anorexia, dysgeusia, and refractory edema.  Will plan for IV lasix  and follow UOP and Scr.    - nontunneled HD cath  - will need TDC and access  - HD #1 orders written- for today  - HD will help uremic itching Anasarca - has history of chronic diastolic CHF.  Repeat ECHO performed today and awaiting results.  IV lasix  as above. Hypocalcemia - repleted with IV calcium .  She was started on calcitriol  0.25 mcg daily in September.  Her iPTH was 327 at that time.  Anemia of CKD stage IV-V - Hgb was 10.4 on 06/09/23.  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  H/o Right RCC s/p partial right nephrectomy 2020 - no evidence of recurrence or mets on CT of abd/pelvis. HTN/volume- as above continue with home bp meds and IV lasix  for diuresis.  Metabolic acidosis - will restart po sodium bicarbonate .   Subjective:    Seen in room.  Still itchy.  For HD line today- nontunneled in the setting of rising WBC ct- this is fine.  Appreciate IR.     Objective:   BP 119/62   Pulse 97   Temp 99 F (37.2 C) (Oral)   Resp 14   Wt 102.9 kg   LMP 09/25/2023   SpO2 100%   BMI 37.75 kg/m   Intake/Output Summary (Last 24 hours) at 10/01/2023 1001 Last data filed at 10/01/2023 0345 Gross  per 24 hour  Intake 480 ml  Output 800 ml  Net -320 ml   Weight change: -4.3 kg  Physical Exam: Gen:NAD, lying in bed CVS: RRR Resp: clear Abd: soft Ext: 3+ anasarca  Imaging: DG Chest Port 1 View Result Date: 09/30/2023 CLINICAL DATA:  Shortness of breath.  CHF. EXAM: PORTABLE CHEST 1 VIEW COMPARISON:  12/12/2020 FINDINGS: The cardio pericardial silhouette is enlarged. The lungs are clear without focal pneumonia, edema, pneumothorax or pleural effusion. No acute bony abnormality. IMPRESSION: Enlargement of the cardiopericardial silhouette without acute cardiopulmonary findings. Electronically Signed   By: Camellia Candle M.D.   On: 09/30/2023 08:58    Labs: BMET Recent Labs  Lab 09/27/23 2200 09/28/23 0602 09/29/23 0450 09/30/23 0527 10/01/23 0447  NA 138 136 139 141 138  K 4.9 4.2 4.7 4.9 4.8  CL 114* 111 114* 115* 111  CO2 12* 12* 13* 13* 11*  GLUCOSE 126* 108* 80 110* 119*  BUN 67* 62* 66* 67* 68*  CREATININE 12.57* 12.38* 13.08* 13.41* 13.85*  CALCIUM  6.2* 6.2* 6.7* 6.8* 6.4*  PHOS  --  8.6* 9.2* 9.4* 9.0*   CBC Recent Labs  Lab 09/29/23 0450 09/29/23 1232 09/30/23 0527 10/01/23 0447  WBC 12.8* 14.4* 13.8* 15.9*  NEUTROABS  --   --  9.8* 13.3*  HGB 7.2* 7.4* 7.1* 7.5*  HCT 22.4* 23.4* 22.4* 23.6*  MCV 88.9 89.3 88.9 89.7  PLT 265 290 258 243    Medications:     carvedilol   6.25 mg Oral BID WC   Chlorhexidine  Gluconate Cloth  6 each Topical Q0600   heparin  injection (subcutaneous)  5,000 Units Subcutaneous Q8H   hydrALAZINE   50 mg Oral Q8H   hydrocerin   Topical BID   insulin  aspart  0-6 Units Subcutaneous Q4H   isosorbide  mononitrate  30 mg Oral Daily   rosuvastatin   5 mg Oral Daily   sodium bicarbonate   650 mg Oral TID    Almarie Bonine MD 10/01/2023, 10:01 AM

## 2023-10-01 NOTE — Progress Notes (Signed)
 PROGRESS NOTE                                                                                                                                                                                                             Patient Demographics:    Sherri Dixon, is a 43 y.o. female, DOB - [birth year], FMW:[id number]  Outpatient Primary MD for the patient is Arby Lyle LABOR, NP    LOS - 3  Admit date - 09/27/2023    Chief Complaint  Patient presents with   Leg Swelling       Brief Narrative (HPI from H&P)   43 y.o. 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 She was volume overloaded and hypertensive in the ED with AKI (Cr 12.57) with acidosis (bicarb 12), also anemic with hgb 6.7g/dl. CT abd/pelvis demonstrated small volume ascites, diffuse anasarca, pericardial effusion which was confirmed to be moderate without tamponade physiology by echo. Nephrology was consulted, IV lasix  started and patient admitted to [facility] to be prepared if HD initiation is required.    Subjective:   Patient in bed, appears comfortable, denies any headache, no fever, no chest pain or pressure, no shortness of breath , no abdominal pain. No new focal weakness.   Assessment  & Plan :    AKI on stage IV CKD with NAGMA : Suspect NSAID-induced injury and/or progressive CKD (has been rising Cr for months). UA bland, 6-10 WBCs, no RBCs, no mention of casts. FENa is 4.7%.  Neurology on board, being treated with high-dose IV Lasix , CT abdomen pelvis nonacute, per nephrology temporary dialysis catheter repeat placed 10/01/2023 with initiation of HD soon thereafter    Anasarca, acute on chronic HFpEF: Echo with LVEF 60-65%, mod LVH, G1DD, and  elevated estimate LVEDP. RV has normal size/function, normal PASP.  See above.   Acute on chronic blood loss anemia and anemia of CKD: No longer menstruating.  Hgb up to 7.4g/dl today s/p 1u RBCs 87/69. Will continue monitoring, keep T&S up to date. Defer ESA to nephrology. Anemia panel has resulted but appears to be posttransfusion specimen.  Agreeable for transfusion if needed in  the hospital setting, type screen done.  Endometrial ablation said to be planned after diabetes better controlled.   Pericardial effusion: No RV collapse on echo, pt actually hypertensive. Up to 1.9cm by echo. IVC not plethoric/dilated. Has had small pericardial effusion dating at least to April 2022.  Could have uremic pericarditis, repeat echo in 2 to 3 weeks.   History of DVT/PE: LE venous U/S without DVT this admission. No hypoxemia, pleuritic CP and normal est. PASP by echo.   Anticipate need to transition to eliquis , currently holding xarelto  and giving ppx heparin  in preparation for possible TDC   HTN: With significant HTN here.  Continue coreg , hydralazine , isosorbide  with goal of gradual BP reduction.    Hypocalcemia: Supplemented.    Pruritus - prn antihistamine, prn topical eucerin and/or aquaphor.  Definitive management may be HD.   Nausea - prn zofran , prn compazine .    History of CVA:  - Continue statin (dose decreased for renal impairment)   History of right RCC s/p partial nephrectomy: CT showed no recurrence or signs of metastases.    Nonspecific somewhat chronic leukocytosis.  Afebrile, stable chest x-ray and UA.  Monitor.    Obesity: Body mass index is 38.85 kg/m.  Follow-up with PCP.  T2DM with peripheral neuropathy: HbA1c is 6.1% -  though this appears to be from posttransfusion specimen, and is falsely depressed in setting of anemia anyway. PCP note shows HbA1c 8.6% in June 2024 at which time Cr was 4.3.  - Continue SSI  CBG (last 3)  Recent Labs    09/30/23 1659 09/30/23 2032  10/01/23 0755  GLUCAP 101* 131* 120*   Lab Results  Component Value Date   HGBA1C 6.1 (H) 09/28/2023         Condition - Fair  Family Communication  :  None  Code Status :  Full  Consults  :  Renal  PUD Prophylaxis :    Procedures  :     TTE -  1. Left ventricular ejection fraction, by estimation, is 60 to 65%. The left ventricle has normal function. The left ventricle has no regional wall motion abnormalities. There is moderate concentric left ventricular hypertrophy. Left ventricular diastolic parameters are consistent with Grade I diastolic dysfunction (impaired relaxation). Elevated left ventricular end-diastolic pressure.  2. Right ventricular systolic function is normal. The right ventricular size is normal. There is normal pulmonary artery systolic pressure.  3. Left atrial size was severely dilated.  4. Pericardial effusion measuring up to 1.9 cm. Moderate pericardial effusion. There is no evidence of cardiac tamponade.  5. The mitral valve is normal in structure. No evidence of mitral valve regurgitation. No evidence of mitral stenosis.  6. The aortic valve is tricuspid. Aortic valve regurgitation is not visualized. No aortic stenosis is present.  7. The inferior vena cava is normal in size with greater than 50% respiratory variability, suggesting right atrial pressure of 3 mmHg.  CT - 1. Postsurgical change in the upper right kidney. No evidence of recurrent or metastatic disease in the abdomen/pelvis. 2. Hepatomegaly. 3. Small volume abdominopelvic ascites. Generalized subcutaneous body wall edema. 4. Moderate circumferential pericardial effusion. 5. Mild wall thickening about the anterior bladder, may be due to underdistention or cystitis. Recommend correlation with urinalysis. 6. Right lower ventral abdominal wall hernia containing fat and small amount of ascites. Aortic Atherosclerosis (ICD10-I70.0)      Disposition Plan  :    Status is: Inpatient   DVT Prophylaxis  :  heparin  injection 5,000 Units Start: 09/28/23 1500 Place and maintain sequential compression device Start: 09/28/23 1115    Lab Results  Component Value Date   PLT 243 10/01/2023    Diet :  Diet Order             Diet NPO time specified  Diet effective now                    Inpatient Medications  Scheduled Meds:  carvedilol   6.25 mg Oral BID WC   Chlorhexidine  Gluconate Cloth  6 each Topical Q0600   heparin  injection (subcutaneous)  5,000 Units Subcutaneous Q8H   hydrALAZINE   50 mg Oral Q8H   hydrocerin   Topical BID   insulin  aspart  0-6 Units Subcutaneous Q4H   isosorbide  mononitrate  30 mg Oral Daily   rosuvastatin   5 mg Oral Daily   sodium bicarbonate   650 mg Oral TID   Continuous Infusions:  furosemide  Stopped (10/01/23 0648)   PRN Meds:.acetaminophen , diphenhydrAMINE , hydrALAZINE , mineral oil-hydrophilic petrolatum, ondansetron  (ZOFRAN ) IV, polyethylene glycol, prochlorperazine , sodium chloride  flush  Antibiotics  :    Anti-infectives (From admission, onward)    None         Objective:   Vitals:   09/30/23 2300 10/01/23 0500 10/01/23 0538 10/01/23 0552  BP: (!) 167/85  119/62 119/62  Pulse: 88  97   Resp: 18  14   Temp: 98.3 F (36.8 C)  99 F (37.2 C)   TempSrc: Oral  Oral   SpO2: 98%  100%   Weight:  102.9 kg      Wt Readings from Last 3 Encounters:  10/01/23 102.9 kg  04/15/23 105.2 kg  03/10/23 108.9 kg     Intake/Output Summary (Last 24 hours) at 10/01/2023 1002 Last data filed at 10/01/2023 0345 Gross per 24 hour  Intake 480 ml  Output 800 ml  Net -320 ml     Physical Exam  Awake Alert, No new F.N deficits, Normal affect [place].AT,PERRAL Supple Neck, No JVD,   Symmetrical Chest wall movement, Good air movement bilaterally, CTAB RRR,No Gallops,Rubs or new Murmurs,  +ve B.Sounds, Abd Soft, No tenderness,   1+ edema         Data Review:    Recent Labs  Lab 09/28/23 0602 09/29/23 0450 09/29/23 1232  09/30/23 0527 10/01/23 0447  WBC 12.6* 12.8* 14.4* 13.8* 15.9*  HGB 8.2* 7.2* 7.4* 7.1* 7.5*  HCT 25.2* 22.4* 23.4* 22.4* 23.6*  PLT 280 265 290 258 243  MCV 92.6 88.9 89.3 88.9 89.7  MCH 30.1 28.6 28.2 28.2 28.5  MCHC 32.5 32.1 31.6 31.7 31.8  RDW 13.8 14.3 14.3 14.3 14.4  LYMPHSABS  --   --   --  1.4 0.6*  MONOABS  --   --   --  1.0 0.8  EOSABS  --   --   --  1.5* 1.0*  BASOSABS  --   --   --  0.0 0.0    Recent Labs  Lab 09/27/23 2200 09/28/23 0602 09/28/23 0654 09/29/23 0450 09/30/23 0527 10/01/23 0447  NA 138 136  --  139 141 138  K 4.9 4.2  --  4.7 4.9 4.8  CL 114* 111  --  114* 115* 111  CO2 12* 12*  --  13* 13* 11*  ANIONGAP 12 13  --  12 13 16*  GLUCOSE 126* 108*  --  80 110* 119*  BUN 67* 62*  --  66* 67*  68*  CREATININE 12.57* 12.38*  --  13.08* 13.41* 13.85*  AST  --  16  17  --  15 16 14*  ALT  --  14  14  --  13 15 14   ALKPHOS  --  98  100  --  98 99 103  BILITOT  --  0.5  0.6  --  0.6 0.4 0.7  ALBUMIN   --  2.4*  2.4*  --  2.0* 2.1* 2.1*  INR  --   --   --   --  1.1  --   HGBA1C  --   --  6.1*  --   --   --   BNP  --  378.5*  --   --  717.8* 590.8*  MG  --  1.9  --  1.8 1.9 2.0  CALCIUM  6.2* 6.2*  --  6.7* 6.8* 6.4*      Recent Labs  Lab 09/27/23 2200 09/28/23 0602 09/28/23 0654 09/29/23 0450 09/30/23 0527 10/01/23 0447  INR  --   --   --   --  1.1  --   HGBA1C  --   --  6.1*  --   --   --   BNP  --  378.5*  --   --  717.8* 590.8*  MG  --  1.9  --  1.8 1.9 2.0  CALCIUM  6.2* 6.2*  --  6.7* 6.8* 6.4*    Lab Results  Component Value Date   HGBA1C 6.1 (H) 09/28/2023   No results for input(s): TSH, T4TOTAL, FREET4, T3FREE, THYROIDAB in the last 72 hours. Recent Labs    09/29/23 0450 09/30/23 0849  VITAMINB12 768 700  FOLATE 17.0 15.3  FERRITIN 171 190  TIBC 120* 132*  IRON 42 58  RETICCTPCT 2.0 2.4    ------------------------------------------------------------------------------------------------------------------ Cardiac Enzymes No results for input(s): CKMB, TROPONINI, MYOGLOBIN in the last 168 hours.  Invalid input(s): CK  Micro Results No results found for this or any previous visit (from the past 240 hours).  Radiology Reports DG Chest Port 1 View Result Date: 09/30/2023 CLINICAL DATA:  Shortness of breath.  CHF. EXAM: PORTABLE CHEST 1 VIEW COMPARISON:  12/12/2020 FINDINGS: The cardio pericardial silhouette is enlarged. The lungs are clear without focal pneumonia, edema, pneumothorax or pleural effusion. No acute bony abnormality. IMPRESSION: Enlargement of the cardiopericardial silhouette without acute cardiopulmonary findings. Electronically Signed   By: Camellia Candle M.D.   On: 09/30/2023 08:58   ECHOCARDIOGRAM COMPLETE Result Date: 09/28/2023    ECHOCARDIOGRAM REPORT   Patient Name:   GYANNA JAREMA Date of Exam: 09/28/2023 Medical Rec #:  [id number]     Height:       65.0 in Accession #:    [id number]    Weight:       232.0 lb Date of Birth:  21-Apr-1981      BSA:          2.107 m Patient Age:    42 years      BP:           165/81 mmHg Patient Gender: F             HR:           85 bpm. Exam Location:  Inpatient Procedure: 2D Echo, Cardiac Doppler and Color Doppler Indications:    abnormal ecg, pericardial effusion  History:        Patient has prior history of Echocardiogram examinations, most  recent 12/04/2020. Risk Factors:Diabetes.  Sonographer:    Ozell Free Referring Phys: [id number] CAROLE N HALL IMPRESSIONS  1. Left ventricular ejection fraction, by estimation, is 60 to 65%. The left ventricle has normal function. The left ventricle has no regional wall motion abnormalities. There is moderate concentric left ventricular hypertrophy. Left ventricular diastolic parameters are consistent with Grade I diastolic dysfunction (impaired relaxation). Elevated left  ventricular end-diastolic pressure.  2. Right ventricular systolic function is normal. The right ventricular size is normal. There is normal pulmonary artery systolic pressure.  3. Left atrial size was severely dilated.  4. Pericardial effusion measuring up to 1.9 cm. Moderate pericardial effusion. There is no evidence of cardiac tamponade.  5. The mitral valve is normal in structure. No evidence of mitral valve regurgitation. No evidence of mitral stenosis.  6. The aortic valve is tricuspid. Aortic valve regurgitation is not visualized. No aortic stenosis is present.  7. The inferior vena cava is normal in size with greater than 50% respiratory variability, suggesting right atrial pressure of 3 mmHg. FINDINGS  Left Ventricle: Left ventricular ejection fraction, by estimation, is 60 to 65%. The left ventricle has normal function. The left ventricle has no regional wall motion abnormalities. The left ventricular internal cavity size was normal in size. There is  moderate concentric left ventricular hypertrophy. Left ventricular diastolic parameters are consistent with Grade I diastolic dysfunction (impaired relaxation). Elevated left ventricular end-diastolic pressure. Right Ventricle: The right ventricular size is normal. No increase in right ventricular wall thickness. Right ventricular systolic function is normal. There is normal pulmonary artery systolic pressure. The tricuspid regurgitant velocity is 2.08 m/s, and  with an assumed right atrial pressure of 3 mmHg, the estimated right ventricular systolic pressure is 20.3 mmHg. Left Atrium: Left atrial size was severely dilated. Right Atrium: Right atrial size was normal in size. Pericardium: Pericardial effusion measuring up to 1.9 cm. A moderately sized pericardial effusion is present. There is excessive respiratory variation in the tricuspid valve spectral Doppler velocities and diastolic collapse of the right atrial wall. There is no evidence of cardiac  tamponade. Mitral Valve: The mitral valve is normal in structure. No evidence of mitral valve regurgitation. No evidence of mitral valve stenosis. MV peak gradient, 10.0 mmHg. The mean mitral valve gradient is 5.0 mmHg. Tricuspid Valve: The tricuspid valve is normal in structure. Tricuspid valve regurgitation is trivial. No evidence of tricuspid stenosis. Aortic Valve: The aortic valve is tricuspid. Aortic valve regurgitation is not visualized. No aortic stenosis is present. Aortic valve mean gradient measures 5.0 mmHg. Aortic valve peak gradient measures 8.1 mmHg. Aortic valve area, by VTI measures 2.48 cm. Pulmonic Valve: The pulmonic valve was normal in structure. Pulmonic valve regurgitation is not visualized. No evidence of pulmonic stenosis. Aorta: The aortic root is normal in size and structure. Venous: The inferior vena cava is normal in size with greater than 50% respiratory variability, suggesting right atrial pressure of 3 mmHg. IAS/Shunts: No atrial level shunt detected by color flow Doppler.  LEFT VENTRICLE PLAX 2D LVIDd:         4.80 cm   Diastology LVIDs:         2.90 cm   LV e' medial:    3.81 cm/s LV PW:         1.50 cm   LV E/e' medial:  32.8 LV IVS:        1.60 cm   LV e' lateral:   4.57 cm/s LVOT diam:  2.00 cm   LV E/e' lateral: 27.4 LV SV:         80 LV SV Index:   38 LVOT Area:     3.14 cm  RIGHT VENTRICLE             IVC RV Basal diam:  4.10 cm     IVC diam: 1.80 cm RV S prime:     12.50 cm/s TAPSE (M-mode): 2.3 cm LEFT ATRIUM             Index        RIGHT ATRIUM           Index LA diam:        4.70 cm 2.23 cm/m   RA Area:     16.70 cm LA Vol (A2C):   87.1 ml 41.34 ml/m  RA Volume:   40.30 ml  19.13 ml/m LA Vol (A4C):   61.3 ml 29.09 ml/m LA Biplane Vol: 79.9 ml 37.92 ml/m  AORTIC VALVE AV Area (Vmax):    2.52 cm AV Area (Vmean):   2.19 cm AV Area (VTI):     2.48 cm AV Vmax:           142.00 cm/s AV Vmean:          111.000 cm/s AV VTI:            0.323 m AV Peak Grad:      8.1  mmHg AV Mean Grad:      5.0 mmHg LVOT Vmax:         114.00 cm/s LVOT Vmean:        77.400 cm/s LVOT VTI:          0.255 m LVOT/AV VTI ratio: 0.79  AORTA Ao Root diam: 2.80 cm Ao Asc diam:  3.20 cm MITRAL VALVE                TRICUSPID VALVE MV Area (PHT): 2.91 cm     TR Peak grad:   17.3 mmHg MV Area VTI:   2.23 cm     TR Vmax:        208.00 cm/s MV Peak grad:  10.0 mmHg MV Mean grad:  5.0 mmHg     SHUNTS MV Vmax:       1.58 m/s     Systemic VTI:  0.26 m MV Vmean:      104.0 cm/s   Systemic Diam: 2.00 cm MV Decel Time: 261 msec MV E velocity: 125.00 cm/s MV A velocity: 140.00 cm/s MV E/A ratio:  0.89 Annabella Scarce MD Electronically signed by Annabella Scarce MD Signature Date/Time: 09/28/2023/3:42:48 PM    Final    VAS US  LOWER EXTREMITY VENOUS (DVT) Result Date: 09/28/2023  Lower Venous DVT Study Patient Name:  JERALDINE PRIMEAU  Date of Exam:   09/28/2023 Medical Rec #: [id number]      Accession #:    [id number] Date of Birth: 1981/04/08       Patient Gender: F Patient Age:   7 years Exam Location:  [facility] Procedure:      VAS US  LOWER EXTREMITY VENOUS (DVT) Referring Phys: TERRY HALL --------------------------------------------------------------------------------  Indications: Swelling, and Edema.  Risk Factors: Hx of PE obesity. Limitations: Body habitus. Comparison Study: Significant changes seen since previous exam 05/22/18. Performing Technologist: Garnette Rockers  Examination Guidelines: A complete evaluation includes B-mode imaging, spectral Doppler, color Doppler, and power Doppler as needed of all accessible portions of each vessel. Bilateral testing is considered  an integral part of a complete examination. Limited examinations for reoccurring indications may be performed as noted. The reflux portion of the exam is performed with the patient in reverse Trendelenburg.  +---------+---------------+---------+-----------+----------+-------------------+ RIGHT     CompressibilityPhasicitySpontaneityPropertiesThrombus Aging      +---------+---------------+---------+-----------+----------+-------------------+ CFV      Full           Yes      Yes                                      +---------+---------------+---------+-----------+----------+-------------------+ SFJ      Full                                                             +---------+---------------+---------+-----------+----------+-------------------+ FV Prox  Full                                                             +---------+---------------+---------+-----------+----------+-------------------+ FV Mid   Full                                                             +---------+---------------+---------+-----------+----------+-------------------+ FV DistalFull                                                             +---------+---------------+---------+-----------+----------+-------------------+ PFV      Full                                                             +---------+---------------+---------+-----------+----------+-------------------+ POP      Full           Yes      Yes                                      +---------+---------------+---------+-----------+----------+-------------------+ PTV                              Yes                  Not well visualized +---------+---------------+---------+-----------+----------+-------------------+ PERO                             Yes                  Not  well visualized +---------+---------------+---------+-----------+----------+-------------------+   +---------+---------------+---------+-----------+----------+-------------------+ LEFT     CompressibilityPhasicitySpontaneityPropertiesThrombus Aging      +---------+---------------+---------+-----------+----------+-------------------+ CFV      Full           Yes      Yes                                       +---------+---------------+---------+-----------+----------+-------------------+ SFJ      Full                                                             +---------+---------------+---------+-----------+----------+-------------------+ FV Prox  Full                                                             +---------+---------------+---------+-----------+----------+-------------------+ FV Mid   Full                                                             +---------+---------------+---------+-----------+----------+-------------------+ FV DistalFull                                                             +---------+---------------+---------+-----------+----------+-------------------+ PFV      Full                                                             +---------+---------------+---------+-----------+----------+-------------------+ POP      Full           Yes      Yes                                      +---------+---------------+---------+-----------+----------+-------------------+ PTV                              Yes                  Not well visualized +---------+---------------+---------+-----------+----------+-------------------+ PERO                             Yes                  Not well visualized +---------+---------------+---------+-----------+----------+-------------------+     Summary: BILATERAL: - No evidence of deep vein thrombosis seen in the lower extremities, bilaterally. -No evidence of popliteal  cyst, bilaterally.   *See table(s) above for measurements and observations. Electronically signed by Norman Serve on 09/28/2023 at 10:51:02 AM.    Final    CT ABDOMEN PELVIS WO CONTRAST Result Date: 09/28/2023 CLINICAL DATA:  Provided history: Kidney cancer, post nephrectomy, monitor EXAM: CT ABDOMEN AND PELVIS WITHOUT CONTRAST TECHNIQUE: Multidetector CT imaging of the abdomen and pelvis was performed following the standard  protocol without IV contrast. RADIATION DOSE REDUCTION: This exam was performed according to the departmental dose-optimization program which includes automated exposure control, adjustment of the mA and/or kV according to patient size and/or use of iterative reconstruction technique. COMPARISON:  Renal ultrasound 06/15/2023, CT 11/08/2019 FINDINGS: Lower chest: The heart is mildly enlarged. Moderate circumferential pericardial effusion measuring up to 2.1 cm adjacent to the right ventricle. Mitral annulus calcifications. No pleural effusion. Hepatobiliary: No evidence of focal liver lesion on this unenhanced exam. The liver is enlarged spanning 21.3 cm cranial caudal. Gallbladder physiologically distended, no calcified stone. No biliary dilatation. Pancreas: No ductal dilatation or inflammation. Spleen: Normal in size without focal abnormality. Adrenals/Urinary Tract: Normal adrenal glands. Contour deformity with probable postsurgical change in the upper right kidney. Simple appearing cyst in the mid right kidney, simple fluid density. No evidence of solid lesion. No hydronephrosis or renal calculi of either kidney. Decompressed ureters. Mild wall thickening about the anterior bladder. Stomach/Bowel: Unremarkable appearance of the stomach. Occasional fluid-filled small bowel in the pelvis. No obstruction or inflammation. Colonic redundancy with small to moderate volume of stool in the colon. Vascular/Lymphatic: Normal caliber abdominal aorta. Mild but age advanced aortic atherosclerosis. No enlarged lymph nodes. Prominent ileocolic node at 9 mm is unchanged from prior CT. Reproductive: Suspected anterior fundal fibroid. Ovaries are not well-defined. No suspicious adnexal mass. Other: Small volume abdominopelvic ascites. Right lower ventral abdominal wall hernia containing fat and small amount of ascites. There is generalized subcutaneous body wall edema. Musculoskeletal: No focal bone lesion.  No acute osseous  finding. IMPRESSION: 1. Postsurgical change in the upper right kidney. No evidence of recurrent or metastatic disease in the abdomen/pelvis. 2. Hepatomegaly. 3. Small volume abdominopelvic ascites. Generalized subcutaneous body wall edema. 4. Moderate circumferential pericardial effusion. 5. Mild wall thickening about the anterior bladder, may be due to underdistention or cystitis. Recommend correlation with urinalysis. 6. Right lower ventral abdominal wall hernia containing fat and small amount of ascites. Aortic Atherosclerosis (ICD10-I70.0). Electronically Signed   By: Andrea Gasman M.D.   On: 09/28/2023 02:58      Signature  -   Lavada Stank M.D on 10/01/2023 at 10:02 AM   -  To page go to [email or web address]

## 2023-10-01 NOTE — Procedures (Signed)
 Interventional Radiology Procedure Note  Procedure: Temporary dialysis catheter placement  Complications: None  Estimated Blood Loss: None  Findings: Right internal jugular 16 cm, 13 Fr triple lumen Mah[place]kar non-tunneled HD catheter placed with tip in RA. OK to use.  Marcey DASEN. Luverne, M.D Pager:  [phone]

## 2023-10-01 NOTE — Plan of Care (Signed)
  Problem: Coping: Goal: Ability to adjust to condition or change in health will improve Outcome: Progressing   Problem: Skin Integrity: Goal: Risk for impaired skin integrity will decrease Outcome: Progressing   Problem: Education: Goal: Knowledge of General Education information will improve Description: Including pain rating scale, medication(s)/side effects and non-pharmacologic comfort measures Outcome: Progressing   Problem: Coping: Goal: Level of anxiety will decrease Outcome: Progressing   Problem: Pain Management: Goal: General experience of comfort will improve Outcome: Progressing

## 2023-10-01 NOTE — Progress Notes (Signed)
 PHARMACY - ANTICOAGULATION CONSULT NOTE  Pharmacy Consult for enoxaparin  Indication: unprovoked DVT/PE 2019  Allergies  Allergen Reactions   Amlodipine Swelling    Pelvis swells   Insulin  Glargine Other (See Comments) and Hives   Humalog  Kwikpen [Insulin  Lispro] Other (See Comments)    75/50---swelling and ulcers in mouth     Patient Measurements: Weight: 102.9 kg (226 lb 13.7 oz)  Vital Signs: Temp: 99 F (37.2 C) (01/02 0538) Temp Source: Oral (01/02 0538) BP: 119/62 (01/02 0552) Pulse Rate: 97 (01/02 0538)  Labs: Recent Labs    09/29/23 0450 09/29/23 1232 09/30/23 0527 10/01/23 0447  HGB 7.2* 7.4* 7.1* 7.5*  HCT 22.4* 23.4* 22.4* 23.6*  PLT 265 290 258 243  LABPROT  --   --  14.9  --   INR  --   --  1.1  --   CREATININE 13.08*  --  13.41* 13.85*    Estimated Creatinine Clearance: 6.2 mL/min (A) (by C-G formula based on SCr of 13.85 mg/dL (H)).   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 Pharmacy consulted for enoxaparin .   Plan to start enoxaparin  after [facility] placement for 1-2 days then switch to apixaban  given renal function.   Goal of Therapy:  Monitor platelets by anticoagulation protocol: Yes   Plan:  Enoxaparin  0.85mg /kg = 80mg  q24hr  F/u bleeding after TDC F/u switch to apixaban    Jinnie Door, PharmD, BCPS, BCCP Clinical Pharmacist  Please check AMION for all [facility] phone numbers After 10:00 PM, call Main Pharmacy [phone]

## 2023-10-01 NOTE — Progress Notes (Signed)
 Received patient in bed to unit.  Alert and oriented.  Informed consent signed and in chart.   TX duration:61minutes  Patient tolerated no catheter wouldn't run. Pt also had cramping Transported back to the room  Alert, without acute distress.  Hand-off given to patient's nurse.   Access used: right [facility] Access issues: yes  Total UF removed: - Medication(s) given: benadryl , tylenol    10/01/23 1615  Vitals  Temp 98.1 F (36.7 C)  Temp Source Oral  BP (!) 117/57  MAP (mmHg) 76  BP Location Right Arm  BP Method Automatic  Patient Position (if appropriate) Lying  Pulse Rate 91  Pulse Rate Source Monitor  ECG Heart Rate 91  Resp (!) 22  Oxygen Therapy  SpO2 99 %  O2 Device Room Air  During Treatment Monitoring  Blood Flow Rate (mL/min) 200 mL/min  Arterial Pressure (mmHg) -125.66 mmHg  Venous Pressure (mmHg) 46.44 mmHg  TMP (mmHg) 13.13 mmHg  Ultrafiltration Rate (mL/min) 0 mL/min  Dialysate Flow Rate (mL/min) 200 ml/min  Dialysate Potassium Concentration 2  Dialysate Calcium  Concentration 2.5  Duration of HD Treatment -hour(s) 0.27 hour(s)  Cumulative Fluid Removed (mL) per Treatment  -910.49  HD Safety Checks Performed Yes  Intra-Hemodialysis Comments  (tx terminated due to catheter not running. blood returned to pt. Dr. Macel notified.)  Dialysis Fluid Bolus Normal Saline  Bolus Amount (mL) 300 mL     10/01/23 1615  Vitals  Temp 98.1 F (36.7 C)  Temp Source Oral  BP (!) 117/57  MAP (mmHg) 76  BP Location Right Arm  BP Method Automatic  Patient Position (if appropriate) Lying  Pulse Rate 91  Pulse Rate Source Monitor  ECG Heart Rate 91  Resp (!) 22  Oxygen Therapy  SpO2 99 %  O2 Device Room Air  During Treatment Monitoring  Blood Flow Rate (mL/min) 200 mL/min  Arterial Pressure (mmHg) -125.66 mmHg  Venous Pressure (mmHg) 46.44 mmHg  TMP (mmHg) 13.13 mmHg  Ultrafiltration Rate (mL/min) 0 mL/min  Dialysate Flow Rate (mL/min) 200 ml/min   Dialysate Potassium Concentration 2  Dialysate Calcium  Concentration 2.5  Duration of HD Treatment -hour(s) 0.27 hour(s)  Cumulative Fluid Removed (mL) per Treatment  -910.49  HD Safety Checks Performed Yes  Intra-Hemodialysis Comments  (tx terminated due to catheter not running. blood returned to pt. Dr. Macel notified.)  Dialysis Fluid Bolus Normal Saline  Bolus Amount (mL) 300 mL      Soua Lenk S [facility]

## 2023-10-01 NOTE — Progress Notes (Signed)
 Requested to see pt for out-pt HD needs at d/c. Attempted to meet with pt at bedside but pt in the process of being taken to HD unit. Will f/u with pt at a later time.   Olivia Canter Renal Navigator [phone]

## 2023-10-01 NOTE — Care Management Important Message (Signed)
 Important Message  Patient Details  Name: Sherri Dixon MRN: [id number] Date of Birth: Jul 04, 1981   Important Message Given:  Yes - Medicare IM     Dorena Bodo 10/01/2023, 4:31 PM

## 2023-10-01 NOTE — Progress Notes (Addendum)
 Date and time results received: 10/01/23 1610 (use smartphrase .now to insert current time)  Test: hemoglobin Critical Value: 6.2  Name of Provider Notified: Dr. Macel  Orders Received? Or Actions Taken? Pt returned to unit due to HD catheter not working message regarding hemoglobin given to Kara Ruth, RN to let attending know

## 2023-10-01 NOTE — Progress Notes (Signed)
 Cathflo activase placed in pt catheter 16:45, label placed on endcap of lumens

## 2023-10-01 NOTE — Procedures (Signed)
 Interventional Radiology Procedure Note  Procedure: Temporary hemodialysis catheter exchange  Findings: Please refer to procedural dictation for full description. 16 cm Trialysis exchanged for 20 cm Trialysis.  All 3 ports aspirate and flush without difficulty.  Complications: None immediate  Estimated Blood Loss: < 5 mL  Recommendations: Catheter ready for immediate use.   Ester Sides, MD

## 2023-10-02 ENCOUNTER — Inpatient Hospital Stay (HOSPITAL_COMMUNITY): Payer: 59

## 2023-10-02 ENCOUNTER — Other Ambulatory Visit (HOSPITAL_COMMUNITY): Payer: Self-pay

## 2023-10-02 ENCOUNTER — Encounter (HOSPITAL_COMMUNITY)
Admission: EM | Disposition: A | Discharge status: Home Health | Payer: Self-pay | Source: Home / Self Care | Attending: Internal Medicine

## 2023-10-02 DIAGNOSIS — N179 Acute kidney failure, unspecified: Secondary | ICD-10-CM | POA: Diagnosis not present

## 2023-10-02 HISTORY — PX: IR FLUORO GUIDE CV LINE RIGHT: IMG2283

## 2023-10-02 HISTORY — PX: DIALYSIS/PERMA CATHETER INSERTION: CATH118288

## 2023-10-02 LAB — CBC WITH DIFFERENTIAL/PLATELET
Abs Immature Granulocytes: 0.16 10*3/uL — ABNORMAL HIGH (ref 0.00–0.07)
Basophils Absolute: 0 10*3/uL (ref 0.0–0.1)
Basophils Relative: 0 %
Eosinophils Absolute: 1 10*3/uL — ABNORMAL HIGH (ref 0.0–0.5)
Eosinophils Relative: 6 %
HCT: 22.4 % — ABNORMAL LOW (ref 36.0–46.0)
Hemoglobin: 6.9 g/dL — CL (ref 12.0–15.0)
Immature Granulocytes: 1 %
Lymphocytes Relative: 6 %
Lymphs Abs: 1 10*3/uL (ref 0.7–4.0)
MCH: 27.8 pg (ref 26.0–34.0)
MCHC: 30.8 g/dL (ref 30.0–36.0)
MCV: 90.3 fL (ref 80.0–100.0)
Monocytes Absolute: 1.2 10*3/uL — ABNORMAL HIGH (ref 0.1–1.0)
Monocytes Relative: 7 %
Neutro Abs: 13.2 10*3/uL — ABNORMAL HIGH (ref 1.7–7.7)
Neutrophils Relative %: 80 %
Platelets: 202 10*3/uL (ref 150–400)
RBC: 2.48 MIL/uL — ABNORMAL LOW (ref 3.87–5.11)
RDW: 14.6 % (ref 11.5–15.5)
WBC: 16.6 10*3/uL — ABNORMAL HIGH (ref 4.0–10.5)
nRBC: 0 % (ref 0.0–0.2)

## 2023-10-02 LAB — GLUCOSE, CAPILLARY
Glucose-Capillary: 100 mg/dL — ABNORMAL HIGH (ref 70–99)
Glucose-Capillary: 106 mg/dL — ABNORMAL HIGH (ref 70–99)
Glucose-Capillary: 118 mg/dL — ABNORMAL HIGH (ref 70–99)
Glucose-Capillary: 140 mg/dL — ABNORMAL HIGH (ref 70–99)

## 2023-10-02 LAB — C-REACTIVE PROTEIN: CRP: 11.7 mg/dL — ABNORMAL HIGH

## 2023-10-02 LAB — COMPREHENSIVE METABOLIC PANEL WITH GFR
ALT: 10 U/L (ref 0–44)
AST: 18 U/L (ref 15–41)
Albumin: 1.8 g/dL — ABNORMAL LOW (ref 3.5–5.0)
Alkaline Phosphatase: 103 U/L (ref 38–126)
Anion gap: 16 — ABNORMAL HIGH (ref 5–15)
BUN: 68 mg/dL — ABNORMAL HIGH (ref 6–20)
CO2: 11 mmol/L — ABNORMAL LOW (ref 22–32)
Calcium: 6.8 mg/dL — ABNORMAL LOW (ref 8.9–10.3)
Chloride: 111 mmol/L (ref 98–111)
Creatinine, Ser: 13.12 mg/dL — ABNORMAL HIGH (ref 0.44–1.00)
GFR, Estimated: 3 mL/min — ABNORMAL LOW
Glucose, Bld: 122 mg/dL — ABNORMAL HIGH (ref 70–99)
Potassium: 4.9 mmol/L (ref 3.5–5.1)
Sodium: 138 mmol/L (ref 135–145)
Total Bilirubin: 0.6 mg/dL (ref 0.0–1.2)
Total Protein: 4.8 g/dL — ABNORMAL LOW (ref 6.5–8.1)

## 2023-10-02 LAB — HEPATITIS B SURFACE ANTIBODY, QUANTITATIVE: Hep B S AB Quant (Post): <3.5 m[IU]/mL — ABNORMAL LOW

## 2023-10-02 LAB — PROCALCITONIN: Procalcitonin: 0.74 ng/mL

## 2023-10-02 LAB — PHOSPHORUS: Phosphorus: 8.6 mg/dL — ABNORMAL HIGH (ref 2.5–4.6)

## 2023-10-02 LAB — MAGNESIUM: Magnesium: 1.8 mg/dL (ref 1.7–2.4)

## 2023-10-02 LAB — BRAIN NATRIURETIC PEPTIDE: B Natriuretic Peptide: 509.8 pg/mL — ABNORMAL HIGH (ref 0.0–100.0)

## 2023-10-02 SURGERY — DIALYSIS/PERMA CATHETER INSERTION
Anesthesia: LOCAL

## 2023-10-02 MED ORDER — DARBEPOETIN ALFA 60 MCG/0.3ML IJ SOSY
60.0000 ug | PREFILLED_SYRINGE | INTRAMUSCULAR | Status: DC
  Administered 2023-10-02: 60 ug via SUBCUTANEOUS
  Filled 2023-10-02: qty 0.3

## 2023-10-02 MED ORDER — SODIUM CHLORIDE 0.9% IV SOLUTION: Freq: Once | INTRAVENOUS | Status: DC

## 2023-10-02 MED ORDER — HEPARIN SODIUM (PORCINE) 1000 UNIT/ML IJ SOLN
2600.0000 [IU] | Freq: Once | INTRAMUSCULAR | Status: AC
Start: 2023-10-02 — End: 2023-10-02
  Administered 2023-10-02: 2600 [IU]

## 2023-10-02 MED ORDER — LIDOCAINE HCL (PF) 1 % IJ SOLN
INTRAMUSCULAR | Status: DC | PRN
  Administered 2023-10-02: 12 mL

## 2023-10-02 MED ORDER — HEPARIN (PORCINE) IN NACL 1000-0.9 UT/500ML-% IV SOLN
INTRAVENOUS | Status: DC | PRN
  Administered 2023-10-02: 500 mL

## 2023-10-02 MED ORDER — HEPARIN SODIUM (PORCINE) 1000 UNIT/ML IJ SOLN
INTRAMUSCULAR | Status: AC
  Filled 2023-10-02: qty 10

## 2023-10-02 MED ORDER — CHLORHEXIDINE GLUCONATE 4 % EX SOLN
CUTANEOUS | Status: AC
  Filled 2023-10-02: qty 15

## 2023-10-02 MED ORDER — INSULIN ASPART 100 UNIT/ML IJ SOLN
0.0000 [IU] | Freq: Three times a day (TID) | INTRAMUSCULAR | Status: DC
  Administered 2023-10-04 – 2023-10-05 (×4): 1 [IU] via SUBCUTANEOUS

## 2023-10-02 MED ORDER — LIDOCAINE-EPINEPHRINE 1 %-1:100000 IJ SOLN
INTRAMUSCULAR | Status: AC
  Filled 2023-10-02: qty 1

## 2023-10-02 MED ORDER — HEPARIN SODIUM (PORCINE) 1000 UNIT/ML IJ SOLN
INTRAMUSCULAR | Status: DC | PRN
  Administered 2023-10-02 (×2): 3000 [IU] via INTRAVENOUS

## 2023-10-02 SURGICAL SUPPLY — 8 items
COVER DOME SNAP 22 D (MISCELLANEOUS) IMPLANT
KIT MICROPUNCTURE NIT STIFF (SHEATH) IMPLANT
KIT PALINDROME-P 55CM (CATHETERS) IMPLANT
KIT PV (KITS) ×1 IMPLANT
SHEATH PROBE COVER 6X72 (BAG) IMPLANT
TRANSDUCER W/STOPCOCK (MISCELLANEOUS) ×1 IMPLANT
TRAY PV CATH (CUSTOM PROCEDURE TRAY) ×1 IMPLANT
WIRE BENTSON .035X145CM (WIRE) IMPLANT

## 2023-10-02 NOTE — Progress Notes (Signed)
 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 She has evidence of volume overload with anasarca with pleural effusions and small amount of ascites.  We discussed the role for IV diuretics, as well as the likelihood that she will need to initiate dialysis during this hospitalization if her renal function does not improve.  She is having mild uremic symptoms with anorexia, dysgeusia, and refractory edema.  Will plan for IV lasix  and follow UOP and Scr.    - nontunneled HD cath  - will need [facility] and access-   - HD #1 orders written- for today  - HD #2 tomorrow 10/02/22  - HD will help uremic itching Anasarca - has history of chronic diastolic CHF.  Repeat ECHO performed today and awaiting results.  IV lasix  as above. Hypocalcemia - repleted with IV calcium .  She was started on calcitriol  0.25 mcg daily in September.  Her iPTH was 327 at that time.  Anemia of CKD stage IV-V - Hgb was 10.4 on 06/09/23. 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  H/o Right RCC s/p partial right nephrectomy 2020 - no evidence of recurrence or mets on CT of abd/pelvis. HTN/volume- as above continue with home bp meds and IV lasix  for diuresis.  Metabolic acidosis - will restart po sodium bicarbonate .   Subjective:    Exchanged catheter after not working yesterday.  Seen with husband in room- hopefully HD #1 will be successful today.      Objective:   BP 121/70 (BP Location: Left Arm)   Pulse 98   Temp 97.8 F (36.6 C) (Oral)   Resp 16   Wt 102.7 kg Comment: bed  LMP 09/25/2023   SpO2 100%   BMI 37.68 kg/m   Intake/Output Summary (Last 24 hours) at 10/02/2023 1239 Last  data filed at 10/01/2023 2212 Gross per 24 hour  Intake --  Output -500 ml  Net 500 ml   Weight change: -0.8 kg  Physical Exam: Gen:NAD, lying in bed CVS: RRR Resp: clear Abd: soft Ext: 3+ anasarca  Imaging: IR Fluoro Guide CV Line Right Result Date: 10/02/2023 INDICATION: 43 year old female with nonfunctional temporary right IJ hemodialysis catheter EXAM: IMAGE GUIDED EXCHANGE OF RIGHT IJ TEMPORARY HEMODIALYSIS CATHETER MEDICATIONS: NONE ANESTHESIA/SEDATION: Moderate (conscious) sedation was NOT employed during this procedure. FLUOROSCOPY: Radiation Exposure Index (as provided by the fluoroscopic device): 1.0 mGy Kerma COMPLICATIONS: None PROCEDURE: Informed written consent was obtained from the patient after a discussion of the risks, benefits, and alternatives to treatment. Questions regarding the procedure were encouraged and answered. The right neck and chest, including the indwelling catheter were prepped with chlorhexidine  in a sterile fashion, and a sterile drape was applied covering the operative field. Maximum barrier sterile technique with sterile gowns and gloves were used for the procedure. A timeout was performed prior to the initiation of the procedure. Scout image was acquired. Wire was passed through the catheter into the IVC under fluoroscopy. The suture was ligated at the neck. And the 20 cm catheter was exchanged for a 16 cm temporary triple-lumen hemodialysis catheter. Wire was removed and a final image was stored demonstrating that  the catheter was present within the upper right atrium. The catheter was flushed with appropriate volume heparin  dwells. 1% lidocaine  was used for local anesthesia. The catheter exit site was secured with a 0-Prolene retention suture. Dressings were applied. The patient tolerated the procedure well without immediate post procedural complication. IMPRESSION: Status post image guided exchange of right IJ temporary hemodialysis catheter. Signed, Ami RAMAN.  Alona ROSALEA GRAVER, RPVI [facility] Radiology Electronically Signed   By: Ami Alona D.O.   On: 10/02/2023 09:51   IR Fluoro Guide CV Line Right Result Date: 10/02/2023 INDICATION: 43 year old female with history of acute kidney injury status post temporary hemodialysis catheter earlier the same day which was not functioning during hemodialysis. The patient presents for catheter check and exchange. EXAM: Temporary CENTRAL VENOUS HEMODIALYSIS CATHETER REPLACEMENT WITH FLUOROSCOPIC GUIDANCE MEDICATIONS: None FLUOROSCOPY TIME:  One mGy COMPLICATIONS: None immediate. PROCEDURE: Informed written consent was obtained from the patient after a discussion of the risks, benefits, and alternatives to treatment. Questions regarding the procedure were encouraged and answered. The skin and external portion of the existing hemodialysis catheter was prepped with chlorhexidine  in a sterile fashion, and a sterile drape was applied covering the operative field. Maximum barrier sterile technique with sterile gowns and gloves were used for the procedure. A timeout was performed prior to the initiation of the procedure. The central lumen of the indwelling hemodialysis catheter aspirated and flushed without difficulty. The red lumen would not aspirate. The blue lumen aspirated and flushed without difficulty. Fluoroscopic evaluation demonstrated the catheter tip in the superior aspect of the right atrium. Local anesthetic was applied to the catheter entry site with 1% lidocaine . The central lumen was cannulated with a Rosen wire was directed under fluoroscopic guidance to the inferior vena cava. The catheter was removed and exchanged for a new, 20 cm Trialysis catheter. The catheter tip was positioned in the inferior aspect of the right atrium. All 3 lumens flushed and aspirated without difficulty. The catheter was flushed with appropriate volume heparin  dwell as. The catheter was  affixed to the skin with an interrupted 0 silk suture. A sterile bandage was applied. The patient tolerated the procedure well without immediate post procedural complication. IMPRESSION: Successful exchange of indwelling non tunneled hemodialysis catheter for 20 cm Trialysis catheter with tips terminating within the right atrium. The catheter is ready for immediate use. Ester Sides, MD [facility] Radiology Electronically Signed   By: Ester Sides M.D.   On: 10/02/2023 07:54   IR Fluoro Guide CV Line Right Result Date: 10/01/2023 CLINICAL DATA:  Acute kidney injury and need for hemodialysis. EXAM: NON-TUNNELED CENTRAL VENOUS HEMODIALYSIS CATHETER PLACEMENT WITH ULTRASOUND AND FLUOROSCOPIC GUIDANCE FLUOROSCOPY: 6 seconds.  3.0 mGy. PROCEDURE: The procedure, risks, benefits, and alternatives were explained to the patient. Questions regarding the procedure were encouraged and answered. The patient understands and consents to the procedure. A time-out was performed prior to initiating the procedure. The right neck and chest were prepped with chlorhexidine  in a sterile fashion, and a sterile drape was applied covering the operative field. Maximum barrier sterile technique with sterile gowns and gloves were used for the procedure. Local anesthesia was provided with 1% lidocaine . Ultrasound was used to confirm patency of the right internal jugular vein. An ultrasound image was saved recorded. After creating a small venotomy incision, a 21 gauge needle was advanced into the right internal jugular vein under direct, real-time ultrasound guidance. Ultrasound image documentation was performed. After  securing guidewire access venous access was dilated over the wire. A 16 cm, 13 French triple-lumen Mahurkar non tunneled hemodialysis catheter was advanced over the wire. Final catheter positioning was confirmed and documented with a fluoroscopic spot image. The catheter was  aspirated, flushed with saline, and injected with appropriate volume heparin  dwells. The catheter exit site was secured with 0-Prolene retention sutures. COMPLICATIONS: None.  No pneumothorax. FINDINGS: After catheter placement, the tip lies in the right atrium. The catheter aspirates normally and is ready for immediate use. IMPRESSION: Placement of non-tunneled central venous hemodialysis catheter via the right internal jugular vein. The catheter tip lies in the right atrium. The catheter is ready for immediate use. Electronically Signed   By: Marcey Moan M.D.   On: 10/01/2023 12:44   IR US  Guide Vasc Access Right Result Date: 10/01/2023 CLINICAL DATA:  Acute kidney injury and need for hemodialysis. EXAM: NON-TUNNELED CENTRAL VENOUS HEMODIALYSIS CATHETER PLACEMENT WITH ULTRASOUND AND FLUOROSCOPIC GUIDANCE FLUOROSCOPY: 6 seconds.  3.0 mGy. PROCEDURE: The procedure, risks, benefits, and alternatives were explained to the patient. Questions regarding the procedure were encouraged and answered. The patient understands and consents to the procedure. A time-out was performed prior to initiating the procedure. The right neck and chest were prepped with chlorhexidine  in a sterile fashion, and a sterile drape was applied covering the operative field. Maximum barrier sterile technique with sterile gowns and gloves were used for the procedure. Local anesthesia was provided with 1% lidocaine . Ultrasound was used to confirm patency of the right internal jugular vein. An ultrasound image was saved recorded. After creating a small venotomy incision, a 21 gauge needle was advanced into the right internal jugular vein under direct, real-time ultrasound guidance. Ultrasound image documentation was performed. After securing guidewire access venous access was dilated over the wire. A 16 cm, 13 French triple-lumen Mahurkar non tunneled hemodialysis catheter was advanced over the wire. Final catheter positioning was confirmed and  documented with a fluoroscopic spot image. The catheter was aspirated, flushed with saline, and injected with appropriate volume heparin  dwells. The catheter exit site was secured with 0-Prolene retention sutures. COMPLICATIONS: None.  No pneumothorax. FINDINGS: After catheter placement, the tip lies in the right atrium. The catheter aspirates normally and is ready for immediate use. IMPRESSION: Placement of non-tunneled central venous hemodialysis catheter via the right internal jugular vein. The catheter tip lies in the right atrium. The catheter is ready for immediate use. Electronically Signed   By: Marcey Moan M.D.   On: 10/01/2023 12:44    Labs: BMET Recent Labs  Lab 09/27/23 2200 09/28/23 0602 09/29/23 0450 09/30/23 0527 10/01/23 0447 10/01/23 1510 10/02/23 0616  NA 138 136 139 141 138 138 138  K 4.9 4.2 4.7 4.9 4.8 4.8 4.9  CL 114* 111 114* 115* 111 113* 111  CO2 12* 12* 13* 13* 11* 12* 11*  GLUCOSE 126* 108* 80 110* 119* 201* 122*  BUN 67* 62* 66* 67* 68* 71* 68*  CREATININE 12.57* 12.38* 13.08* 13.41* 13.85* 14.17* 13.12*  CALCIUM  6.2* 6.2* 6.7* 6.8* 6.4* 6.4* 6.8*  PHOS  --  8.6* 9.2* 9.4* 9.0* 9.1* 8.6*   CBC Recent Labs  Lab 09/30/23 0527 10/01/23 0447 10/01/23 1510 10/01/23 1843 10/02/23 0616  WBC 13.8* 15.9* 18.9* 19.3* 16.6*  NEUTROABS 9.8* 13.3*  --   --  13.2*  HGB 7.1* 7.5* 6.2* 6.9* 6.9*  HCT 22.4* 23.6* 20.1* 21.7* 22.4*  MCV 88.9 89.7 92.2 90.0 90.3  PLT 258 243 216 150 202  Medications:     sodium chloride    Intravenous Once   carvedilol   6.25 mg Oral BID WC   Chlorhexidine  Gluconate Cloth  6 each Topical Q0600   docusate sodium   200 mg Oral BID   enoxaparin  (LOVENOX ) injection  80 mg Subcutaneous QHS   hydrALAZINE   50 mg Oral Q8H   hydrocerin   Topical BID   insulin  aspart  0-6 Units Subcutaneous TID WC   isosorbide  mononitrate  30 mg Oral Daily   pantoprazole   40 mg Oral Daily   rosuvastatin   5 mg Oral Daily   sodium bicarbonate   650  mg Oral TID    Almarie Bonine MD 10/02/2023, 12:39 PM

## 2023-10-02 NOTE — Progress Notes (Signed)
 PROGRESS NOTE                                                                                                                                                                                                             Patient Demographics:    Sherri Dixon, is a 43 y.o. female, DOB - 1981-05-31, FMW:[id number]  Outpatient Primary MD for the patient is Arby Lyle LABOR, NP    LOS - 4  Admit date - 09/27/2023    Chief Complaint  Patient presents with   Leg Swelling       Brief Narrative (HPI from H&P)   43 y.o. 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 She was volume overloaded and hypertensive in the ED with AKI (Cr 12.57) with acidosis (bicarb 12), also anemic with hgb 6.7g/dl. CT abd/pelvis demonstrated small volume ascites, diffuse anasarca, pericardial effusion which was confirmed to be moderate without tamponade physiology by echo. Nephrology was consulted, IV lasix  started and patient admitted to [facility] to be prepared if HD initiation is required.    Subjective:   Patient in bed denies any headache, no chest or abdominal pain, mildly short of breath, did have some bleeding around the HD catheter site late night 1-20 25.   Assessment  & Plan :    AKI on stage IV CKD with NAGMA : Suspect NSAID-induced injury and/or progressive CKD (has been rising Cr for months). UA bland, 6-10 WBCs, no RBCs, no mention of casts. FENa is 4.7%.  Neurology on board, being treated with high-dose IV Lasix , CT abdomen pelvis nonacute, per nephrology temporary dialysis catheter repeat placed 10/01/2023 early morning, first catheter did not draw any blood it was exchanged by Dr. Jennefer on 10/01/2023 evening, unfortunately she  had some bleeding from the site 10/02/2023 night, will have IR take a look.  Likely HD will be commenced once catheter has stabilized.    Anasarca, acute on chronic HFpEF: Echo with LVEF 60-65%, mod LVH, G1DD, and elevated estimate LVEDP. RV has normal size/function, normal PASP.  See above.   Acute on chronic blood loss anemia and anemia of CKD: No longer menstruating.  Hgb up to 7.4g/dl today s/p 1u RBCs 87/69. Will continue monitoring, keep T&S up to date. Defer ESA to nephrology. Anemia panel has resulted but appears to be posttransfusion specimen.  Agreeable for transfusion if needed in the hospital setting, type screen done.  Endometrial ablation said to be planned after diabetes better controlled.  Will transfuse 1 unit of packed RBC on 10/02/2023 with HD.   Pericardial effusion: No RV collapse on echo, pt actually hypertensive. Up to 1.9cm by echo. IVC not plethoric/dilated. Has had small pericardial effusion dating at least to April 2022.  Could have uremic pericarditis, repeat echo in 2 to 3 weeks.   History of DVT/PE: LE venous U/S without DVT this admission. No hypoxemia, pleuritic CP and normal est. PASP by echo.   Anticipate need to transition to eliquis , currently holding xarelto  and giving ppx heparin  in preparation for possible TDC   HTN: With significant HTN here.  Continue coreg , hydralazine , isosorbide  with goal of gradual BP reduction.    Hypocalcemia: Supplemented.    Pruritus - prn antihistamine, prn topical eucerin and/or aquaphor.  Definitive management may be HD.   Nausea - prn zofran , prn compazine .    History of CVA:  - Continue statin (dose decreased for renal impairment)   History of right RCC s/p partial nephrectomy: CT showed no recurrence or signs of metastases.    Nonspecific somewhat chronic leukocytosis.  Afebrile, stable chest x-ray and UA.  Monitor.    Obesity: Body mass index is 38.85 kg/m.  Follow-up with PCP.  T2DM with peripheral neuropathy: HbA1c is  6.1% -  though this appears to be from posttransfusion specimen, and is falsely depressed in setting of anemia anyway. PCP note shows HbA1c 8.6% in June 2024 at which time Cr was 4.3.  - Continue SSI  CBG (last 3)  Recent Labs    10/02/23 0033 10/02/23 0409 10/02/23 0810  GLUCAP 118* 106* 100*   Lab Results  Component Value Date   HGBA1C 6.1 (H) 09/28/2023         Condition - Fair  Family Communication  :  None  Code Status :  Full  Consults  :  Renal  PUD Prophylaxis :    Procedures  :     Right IJ HD catheter placed by IR on 10/01/2023.    TTE -  1. Left ventricular ejection fraction, by estimation, is 60 to 65%. The left ventricle has normal function. The left ventricle has no regional wall motion abnormalities. There is moderate concentric left ventricular hypertrophy. Left ventricular diastolic parameters are consistent with Grade I diastolic dysfunction (impaired relaxation). Elevated left ventricular end-diastolic pressure.  2. Right ventricular systolic function is normal. The right ventricular size is normal. There is normal pulmonary artery systolic pressure.  3. Left atrial size was severely dilated.  4. Pericardial effusion measuring up to 1.9 cm. Moderate pericardial effusion. There is no evidence of cardiac tamponade.  5. The mitral valve is normal in structure. No evidence of mitral valve regurgitation. No evidence of mitral stenosis.  6. The aortic valve is tricuspid. Aortic valve regurgitation is not visualized. No aortic stenosis is present.  7. The inferior vena cava is normal in size with greater than 50% respiratory variability, suggesting right atrial pressure of 3 mmHg.  CT - 1. Postsurgical change in the upper right kidney. No evidence of recurrent or metastatic disease in the abdomen/pelvis. 2. Hepatomegaly. 3. Small volume abdominopelvic ascites. Generalized subcutaneous body wall edema. 4. Moderate circumferential pericardial  effusion. 5. Mild wall  thickening about the anterior bladder, may be due to underdistention or cystitis. Recommend correlation with urinalysis. 6. Right lower ventral abdominal wall hernia containing fat and small amount of ascites. Aortic Atherosclerosis (ICD10-I70.0)      Disposition Plan  :    Status is: Inpatient   DVT Prophylaxis  :    Place and maintain sequential compression device Start: 09/28/23 1115    Lab Results  Component Value Date   PLT 202 10/02/2023    Diet :  Diet Order             Diet renal with fluid restriction Fluid restriction: 1200 mL Fluid; Room service appropriate? Yes; Fluid consistency: Thin  Diet effective now                    Inpatient Medications  Scheduled Meds:  carvedilol   6.25 mg Oral BID WC   Chlorhexidine  Gluconate Cloth  6 each Topical Q0600   docusate sodium   200 mg Oral BID   enoxaparin  (LOVENOX ) injection  80 mg Subcutaneous QHS   hydrALAZINE   50 mg Oral Q8H   hydrocerin   Topical BID   insulin  aspart  0-6 Units Subcutaneous Q4H   isosorbide  mononitrate  30 mg Oral Daily   pantoprazole   40 mg Oral Daily   rosuvastatin   5 mg Oral Daily   sodium bicarbonate   650 mg Oral TID   Continuous Infusions:  furosemide  120 mg (10/02/23 0548)   PRN Meds:.acetaminophen , diphenhydrAMINE , hydrALAZINE , mineral oil-hydrophilic petrolatum, nitroGLYCERIN , ondansetron  (ZOFRAN ) IV, polyethylene glycol, prochlorperazine , sodium chloride  flush, sodium chloride  flush  Antibiotics  :    Anti-infectives (From admission, onward)    None         Objective:   Vitals:   10/02/23 0005 10/02/23 0034 10/02/23 0411 10/02/23 0812  BP: 117/72  135/81 121/70  Pulse:   90 98  Resp: 15 13 14 16   Temp:  98.6 F (37 C) 97.9 F (36.6 C) 97.8 F (36.6 C)  TempSrc:  Oral Oral Oral  SpO2:      Weight:        Wt Readings from Last 3 Encounters:  10/01/23 102.7 kg  04/15/23 105.2 kg  03/10/23 108.9 kg     Intake/Output Summary (Last 24 hours) at 10/02/2023  0928 Last data filed at 10/01/2023 2212 Gross per 24 hour  Intake --  Output -500 ml  Net 500 ml     Physical Exam  Awake Alert, No new F.N deficits, Normal affect [place].AT,PERRAL Supple Neck, No JVD,   Symmetrical Chest wall movement, Good air movement bilaterally, CTAB RRR,No Gallops,Rubs or new Murmurs,  +ve B.Sounds, Abd Soft, No tenderness,   1+ edema, right foot under bandage, right IJ HD catheter        Data Review:    Recent Labs  Lab 09/30/23 0527 10/01/23 0447 10/01/23 1510 10/01/23 1843 10/02/23 0616  WBC 13.8* 15.9* 18.9* 19.3* 16.6*  HGB 7.1* 7.5* 6.2* 6.9* 6.9*  HCT 22.4* 23.6* 20.1* 21.7* 22.4*  PLT 258 243 216 150 202  MCV 88.9 89.7 92.2 90.0 90.3  MCH 28.2 28.5 28.4 28.6 27.8  MCHC 31.7 31.8 30.8 31.8 30.8  RDW 14.3 14.4 14.6 14.5 14.6  LYMPHSABS 1.4 0.6*  --   --  1.0  MONOABS 1.0 0.8  --   --  1.2*  EOSABS 1.5* 1.0*  --   --  1.0*  BASOSABS 0.0 0.0  --   --  0.0    Recent Labs  Lab 09/28/23 0602 09/28/23 0654 09/29/23 0450 09/30/23 0527 10/01/23 0447 10/01/23 1510 10/01/23 1519 10/02/23 0616  NA 136  --  139 141 138 138  --  138  K 4.2  --  4.7 4.9 4.8 4.8  --  4.9  CL 111  --  114* 115* 111 113*  --  111  CO2 12*  --  13* 13* 11* 12*  --  11*  ANIONGAP 13  --  12 13 16* 13  --  16*  GLUCOSE 108*  --  80 110* 119* 201*  --  122*  BUN 62*  --  66* 67* 68* 71*  --  68*  CREATININE 12.38*  --  13.08* 13.41* 13.85* 14.17*  --  13.12*  AST 16  17  --  15 16 14*  --   --  18  ALT 14  14  --  13 15 14   --   --  10  ALKPHOS 98  100  --  98 99 103  --   --  103  BILITOT 0.5  0.6  --  0.6 0.4 0.7  --   --  0.6  ALBUMIN  2.4*  2.4*  --  2.0* 2.1* 2.1* 1.7*  --  1.8*  CRP  --   --   --   --   --  4.7*  --  11.7*  PROCALCITON  --   --   --   --   --   --  0.41 0.74  INR  --   --   --  1.1  --   --   --   --   HGBA1C  --  6.1*  --   --   --   --   --   --   BNP 378.5*  --   --  717.8* 590.8*  --   --  509.8*  MG 1.9  --  1.8 1.9 2.0  --    --  1.8  CALCIUM  6.2*  --  6.7* 6.8* 6.4* 6.4*  --  6.8*      Recent Labs  Lab 09/28/23 0602 09/28/23 0654 09/29/23 0450 09/30/23 0527 10/01/23 0447 10/01/23 1510 10/01/23 1519 10/02/23 0616  CRP  --   --   --   --   --  4.7*  --  11.7*  PROCALCITON  --   --   --   --   --   --  0.41 0.74  INR  --   --   --  1.1  --   --   --   --   HGBA1C  --  6.1*  --   --   --   --   --   --   BNP 378.5*  --   --  717.8* 590.8*  --   --  509.8*  MG 1.9  --  1.8 1.9 2.0  --   --  1.8  CALCIUM  6.2*  --  6.7* 6.8* 6.4* 6.4*  --  6.8*    Lab Results  Component Value Date   HGBA1C 6.1 (H) 09/28/2023   No results for input(s): TSH, T4TOTAL, FREET4, T3FREE, THYROIDAB in the last 72 hours. Recent Labs    09/30/23 0849  VITAMINB12 700  FOLATE 15.3  FERRITIN 190  TIBC 132*  IRON 58  RETICCTPCT 2.4   ------------------------------------------------------------------------------------------------------------------ Cardiac Enzymes No results for input(s): CKMB, TROPONINI, MYOGLOBIN in the  last 168 hours.  Invalid input(s): CK  Micro Results No results found for this or any previous visit (from the past 240 hours).  Radiology Reports IR Fluoro Guide CV Line Right Result Date: 10/02/2023 INDICATION: 43 year old female with history of acute kidney injury status post temporary hemodialysis catheter earlier the same day which was not functioning during hemodialysis. The patient presents for catheter check and exchange. EXAM: Temporary CENTRAL VENOUS HEMODIALYSIS CATHETER REPLACEMENT WITH FLUOROSCOPIC GUIDANCE MEDICATIONS: None FLUOROSCOPY TIME:  One mGy COMPLICATIONS: None immediate. PROCEDURE: Informed written consent was obtained from the patient after a discussion of the risks, benefits, and alternatives to treatment. Questions regarding the procedure were encouraged and answered. The skin and external portion of the existing hemodialysis catheter was prepped with chlorhexidine   in a sterile fashion, and a sterile drape was applied covering the operative field. Maximum barrier sterile technique with sterile gowns and gloves were used for the procedure. A timeout was performed prior to the initiation of the procedure. The central lumen of the indwelling hemodialysis catheter aspirated and flushed without difficulty. The red lumen would not aspirate. The blue lumen aspirated and flushed without difficulty. Fluoroscopic evaluation demonstrated the catheter tip in the superior aspect of the right atrium. Local anesthetic was applied to the catheter entry site with 1% lidocaine . The central lumen was cannulated with a Rosen wire was directed under fluoroscopic guidance to the inferior vena cava. The catheter was removed and exchanged for a new, 20 cm Trialysis catheter. The catheter tip was positioned in the inferior aspect of the right atrium. All 3 lumens flushed and aspirated without difficulty. The catheter was flushed with appropriate volume heparin  dwell as. The catheter was affixed to the skin with an interrupted 0 silk suture. A sterile bandage was applied. The patient tolerated the procedure well without immediate post procedural complication. IMPRESSION: Successful exchange of indwelling non tunneled hemodialysis catheter for 20 cm Trialysis catheter with tips terminating within the right atrium. The catheter is ready for immediate use. Ester Sides, MD [facility] Electronically Signed   By: Ester Sides M.D.   On: 10/02/2023 07:54   IR Fluoro Guide CV Line Right Result Date: 10/01/2023 CLINICAL DATA:  Acute kidney injury and need for hemodialysis. EXAM: NON-TUNNELED CENTRAL VENOUS HEMODIALYSIS CATHETER PLACEMENT WITH ULTRASOUND AND FLUOROSCOPIC GUIDANCE FLUOROSCOPY: 6 seconds.  3.0 mGy. PROCEDURE: The procedure, risks, benefits, and alternatives were explained to the patient. Questions regarding the procedure were  encouraged and answered. The patient understands and consents to the procedure. A time-out was performed prior to initiating the procedure. The right neck and chest were prepped with chlorhexidine  in a sterile fashion, and a sterile drape was applied covering the operative field. Maximum barrier sterile technique with sterile gowns and gloves were used for the procedure. Local anesthesia was provided with 1% lidocaine . Ultrasound was used to confirm patency of the right internal jugular vein. An ultrasound image was saved recorded. After creating a small venotomy incision, a 21 gauge needle was advanced into the right internal jugular vein under direct, real-time ultrasound guidance. Ultrasound image documentation was performed. After securing guidewire access venous access was dilated over the wire. A 16 cm, 13 French triple-lumen Mahurkar non tunneled hemodialysis catheter was advanced over the wire. Final catheter positioning was confirmed and documented with a fluoroscopic spot image. The catheter was aspirated, flushed with saline, and injected with appropriate volume heparin  dwells. The catheter exit site was secured with 0-Prolene retention sutures. COMPLICATIONS: None.  No pneumothorax. FINDINGS: After catheter placement, the tip lies in the right atrium. The catheter aspirates normally and is ready for immediate use. IMPRESSION: Placement of non-tunneled central venous hemodialysis catheter via the right internal jugular vein. The catheter tip lies in the right atrium. The catheter is ready for immediate use. Electronically Signed   By: Marcey Moan M.D.   On: 10/01/2023 12:44   IR US  Guide Vasc Access Right Result Date: 10/01/2023 CLINICAL DATA:  Acute kidney injury and need for hemodialysis. EXAM: NON-TUNNELED CENTRAL VENOUS HEMODIALYSIS CATHETER PLACEMENT WITH ULTRASOUND AND FLUOROSCOPIC GUIDANCE FLUOROSCOPY: 6 seconds.  3.0 mGy. PROCEDURE: The procedure, risks, benefits, and alternatives were  explained to the patient. Questions regarding the procedure were encouraged and answered. The patient understands and consents to the procedure. A time-out was performed prior to initiating the procedure. The right neck and chest were prepped with chlorhexidine  in a sterile fashion, and a sterile drape was applied covering the operative field. Maximum barrier sterile technique with sterile gowns and gloves were used for the procedure. Local anesthesia was provided with 1% lidocaine . Ultrasound was used to confirm patency of the right internal jugular vein. An ultrasound image was saved recorded. After creating a small venotomy incision, a 21 gauge needle was advanced into the right internal jugular vein under direct, real-time ultrasound guidance. Ultrasound image documentation was performed. After securing guidewire access venous access was dilated over the wire. A 16 cm, 13 French triple-lumen Mahurkar non tunneled hemodialysis catheter was advanced over the wire. Final catheter positioning was confirmed and documented with a fluoroscopic spot image. The catheter was aspirated, flushed with saline, and injected with appropriate volume heparin  dwells. The catheter exit site was secured with 0-Prolene retention sutures. COMPLICATIONS: None.  No pneumothorax. FINDINGS: After catheter placement, the tip lies in the right atrium. The catheter aspirates normally and is ready for immediate use. IMPRESSION: Placement of non-tunneled central venous hemodialysis catheter via the right internal jugular vein. The catheter tip lies in the right atrium. The catheter is ready for immediate use. Electronically Signed   By: Marcey Moan M.D.   On: 10/01/2023 12:44   DG Chest Port 1 View Result Date: 09/30/2023 CLINICAL DATA:  Shortness of breath.  CHF. EXAM: PORTABLE CHEST 1 VIEW COMPARISON:  12/12/2020 FINDINGS: The cardio pericardial silhouette is enlarged. The lungs are clear without focal pneumonia, edema, pneumothorax or  pleural effusion. No acute bony abnormality. IMPRESSION: Enlargement of the cardiopericardial silhouette without acute cardiopulmonary findings. Electronically Signed   By: Camellia Candle M.D.   On: 09/30/2023 08:58   ECHOCARDIOGRAM COMPLETE Result Date: 09/28/2023    ECHOCARDIOGRAM REPORT   Patient Name:   Sherri Dixon Date of Exam: 09/28/2023 Medical Rec #:  [id number]     Height:       65.0 in Accession #:    [id number]    Weight:       232.0 lb Date of Birth:  [birth year]      BSA:          2.107 m Patient Age:    42 years      BP:           165/81 mmHg Patient Gender: F             HR:           85 bpm. Exam Location:  Inpatient Procedure: 2D Echo, Cardiac Doppler and Color Doppler Indications:    abnormal ecg, pericardial effusion  History:  Patient has prior history of Echocardiogram examinations, most                 recent 12/04/2020. Risk Factors:Diabetes.  Sonographer:    Ozell Free Referring Phys: [id number] CAROLE N HALL IMPRESSIONS  1. Left ventricular ejection fraction, by estimation, is 60 to 65%. The left ventricle has normal function. The left ventricle has no regional wall motion abnormalities. There is moderate concentric left ventricular hypertrophy. Left ventricular diastolic parameters are consistent with Grade I diastolic dysfunction (impaired relaxation). Elevated left ventricular end-diastolic pressure.  2. Right ventricular systolic function is normal. The right ventricular size is normal. There is normal pulmonary artery systolic pressure.  3. Left atrial size was severely dilated.  4. Pericardial effusion measuring up to 1.9 cm. Moderate pericardial effusion. There is no evidence of cardiac tamponade.  5. The mitral valve is normal in structure. No evidence of mitral valve regurgitation. No evidence of mitral stenosis.  6. The aortic valve is tricuspid. Aortic valve regurgitation is not visualized. No aortic stenosis is present.  7. The inferior vena cava is normal in size with  greater than 50% respiratory variability, suggesting right atrial pressure of 3 mmHg. FINDINGS  Left Ventricle: Left ventricular ejection fraction, by estimation, is 60 to 65%. The left ventricle has normal function. The left ventricle has no regional wall motion abnormalities. The left ventricular internal cavity size was normal in size. There is  moderate concentric left ventricular hypertrophy. Left ventricular diastolic parameters are consistent with Grade I diastolic dysfunction (impaired relaxation). Elevated left ventricular end-diastolic pressure. Right Ventricle: The right ventricular size is normal. No increase in right ventricular wall thickness. Right ventricular systolic function is normal. There is normal pulmonary artery systolic pressure. The tricuspid regurgitant velocity is 2.08 m/s, and  with an assumed right atrial pressure of 3 mmHg, the estimated right ventricular systolic pressure is 20.3 mmHg. Left Atrium: Left atrial size was severely dilated. Right Atrium: Right atrial size was normal in size. Pericardium: Pericardial effusion measuring up to 1.9 cm. A moderately sized pericardial effusion is present. There is excessive respiratory variation in the tricuspid valve spectral Doppler velocities and diastolic collapse of the right atrial wall. There is no evidence of cardiac tamponade. Mitral Valve: The mitral valve is normal in structure. No evidence of mitral valve regurgitation. No evidence of mitral valve stenosis. MV peak gradient, 10.0 mmHg. The mean mitral valve gradient is 5.0 mmHg. Tricuspid Valve: The tricuspid valve is normal in structure. Tricuspid valve regurgitation is trivial. No evidence of tricuspid stenosis. Aortic Valve: The aortic valve is tricuspid. Aortic valve regurgitation is not visualized. No aortic stenosis is present. Aortic valve mean gradient measures 5.0 mmHg. Aortic valve peak gradient measures 8.1 mmHg. Aortic valve area, by VTI measures 2.48 cm. Pulmonic  Valve: The pulmonic valve was normal in structure. Pulmonic valve regurgitation is not visualized. No evidence of pulmonic stenosis. Aorta: The aortic root is normal in size and structure. Venous: The inferior vena cava is normal in size with greater than 50% respiratory variability, suggesting right atrial pressure of 3 mmHg. IAS/Shunts: No atrial level shunt detected by color flow Doppler.  LEFT VENTRICLE PLAX 2D LVIDd:         4.80 cm   Diastology LVIDs:         2.90 cm   LV e' medial:    3.81 cm/s LV PW:         1.50 cm   LV E/e' medial:  32.8 LV IVS:  1.60 cm   LV e' lateral:   4.57 cm/s LVOT diam:     2.00 cm   LV E/e' lateral: 27.4 LV SV:         80 LV SV Index:   38 LVOT Area:     3.14 cm  RIGHT VENTRICLE             IVC RV Basal diam:  4.10 cm     IVC diam: 1.80 cm RV S prime:     12.50 cm/s TAPSE (M-mode): 2.3 cm LEFT ATRIUM             Index        RIGHT ATRIUM           Index LA diam:        4.70 cm 2.23 cm/m   RA Area:     16.70 cm LA Vol (A2C):   87.1 ml 41.34 ml/m  RA Volume:   40.30 ml  19.13 ml/m LA Vol (A4C):   61.3 ml 29.09 ml/m LA Biplane Vol: 79.9 ml 37.92 ml/m  AORTIC VALVE AV Area (Vmax):    2.52 cm AV Area (Vmean):   2.19 cm AV Area (VTI):     2.48 cm AV Vmax:           142.00 cm/s AV Vmean:          111.000 cm/s AV VTI:            0.323 m AV Peak Grad:      8.1 mmHg AV Mean Grad:      5.0 mmHg LVOT Vmax:         114.00 cm/s LVOT Vmean:        77.400 cm/s LVOT VTI:          0.255 m LVOT/AV VTI ratio: 0.79  AORTA Ao Root diam: 2.80 cm Ao Asc diam:  3.20 cm MITRAL VALVE                TRICUSPID VALVE MV Area (PHT): 2.91 cm     TR Peak grad:   17.3 mmHg MV Area VTI:   2.23 cm     TR Vmax:        208.00 cm/s MV Peak grad:  10.0 mmHg MV Mean grad:  5.0 mmHg     SHUNTS MV Vmax:       1.58 m/s     Systemic VTI:  0.26 m MV Vmean:      104.0 cm/s   Systemic Diam: 2.00 cm MV Decel Time: 261 msec MV E velocity: 125.00 cm/s MV A velocity: 140.00 cm/s MV E/A ratio:  0.89 Annabella Scarce  MD Electronically signed by Annabella Scarce MD Signature Date/Time: 09/28/2023/3:42:48 PM    Final       Signature  -   Lavada Stank M.D on 10/02/2023 at 9:28 AM   -  To page go to [email or web address]

## 2023-10-02 NOTE — Progress Notes (Signed)
 Requested to see pt for out-pt HD needs at d/c. Met with pt and pt's husband at bedside. Introduced self and explained role. Discussed out-pt HD options. Pt and pt's husband voiced interest in TCU at [facility] for education on HD and education on home therapy options. Referral submitted to [facility] admissions this afternoon for review. Pt's husband plans to transport pt to HD appts at d/c. Will assist as needed.   Randine Mungo Renal Navigator [phone]

## 2023-10-02 NOTE — Progress Notes (Signed)
 Pt  has been refusing to have leads on her chest due to itchiness all night and this morning. MD notified and confirmed to d/c tele order

## 2023-10-02 NOTE — Progress Notes (Signed)
 VAST consult. Secure chat sent. Patient currently in cathlab. Secure chat sent to verify needs. Tomasita Morrow, RN VAST

## 2023-10-02 NOTE — Procedures (Signed)
 Interventional Radiology Procedure Note  Procedure: Exchange of a right IJ approach triple lumen temp HD cath.   Tip is positioned at the superior cavoatrial junction and catheter is ready for immediate use.   Complications: None Recommendations:  - Ok to use - Do not submerge - Routine line care   Signed,  Ami RAMAN. Alona, DO

## 2023-10-02 NOTE — Progress Notes (Signed)
 Received patient in bed.Awake,alert and oriented x 4.  Access used. Right TLC ,both catheter limb have sluggish /tight blood back flow that keeps HD machine beep a lot. Renal MD made aware.Treatment aborted few minutes later due to the problem mentioned above.  Medicine given: Tylenol  650 mg.  Hand off to the patient's nurse. Back into her room with stable medical condition via transporter.

## 2023-10-02 NOTE — Op Note (Signed)
    Patient name: Sherri Dixon MRN: [id number] DOB: 1981/04/19 Sex: female  10/02/2023 Pre-operative Diagnosis: Acute renal failure necessitating HD Post-operative diagnosis:  Same Surgeon:  Norman GORMAN Serve, MD Procedure Performed:  Ultrasound-guided access of right common femoral vein Right femoral vein tunneled dialysis catheter placement  Indications:  This is a 43 y.o. female in need of urgent dialysis due to acute renal failure with anasarca and a pericardial effusion who has had multiple failures of her right IJ line despite multiple exchanges by IR. The imaging from her IR exchanges were reviewed which demonstrated excellent placement into the right atrium and is unclear why her mind continues to fail as she is also on therapeutic Lovenox  for history of DVT PE. Explained that our only remaining option is to place a femoral line.  Risks and benefits were reviewed with the patient and she elected to proceed.  Findings: Compressible right common femoral vein.  Adequately placed right femoral tunneled dialysis catheter into IVC   Procedure:  The patient was identified in the holding area and taken to the cath lab  The patient was then placed supine on the table and prepped and draped in the usual sterile fashion.  A time out was called.  Using ultrasound guidance the right femoral vein was accessed with micropuncture technique.  Through the micropuncture sheath, the guidewire was advanced into the inferior vena cava.  A small incision was made around the skin access point.  The access point was serially dilated under direct fluoroscopic guidance.  A peel-away sheath was introduced into the superior vena cava under fluoroscopic guidance.  A counterincision was made in the lateral aspect of the thigh.  A 55 cm tunnel dialysis catheter was then tunneled under the skin, into the right groin access site incision.  The tunneling device was removed and the catheter fed through the peel-away sheath into the  inferior vena cava.  The peel-away sheath was removed and the catheter gently pulled back.  Adequate position was confirmed with x-ray.  The catheter was tested and found to aspirate and flush with ease.    The catheter was sutured to the skin and the groin incision was closed with 4-0 Monocryl.  The catheter was then capped and heparin  locked.  Contrast: none  Sedation: none    Norman GORMAN Serve MD [facility] [place] Office: [phone]

## 2023-10-02 NOTE — Consult Note (Signed)
 Hospital Consult    Reason for Consult: Urgent dialysis access  MRN #:  [id number]  History of Present Illness: This is a 43 y.o. female with history of renal cell carcinoma status post right renal nephrectomy in 2020 and stage IV CKD who presented to the ED with volume overload and anasarca with worsening renal function.  She has had multiple IJ temporary dialysis lines, that have clotted the meeting exchanges by IR.  Most recently the right IJ line was exchanged this morning and although failed again during dialysis today.  She is uremic with diffuse anasarca and pericardial effusion and the need for urgent dialysis.  Past Medical History:  Diagnosis Date   Anemia    Asthma    CHF (congestive heart failure) (HCC)    Pt reports that father has CHF, not her   Diabetes mellitus without complication (HCC)    Dyslipidemia    GERD (gastroesophageal reflux disease)    Pulmonary embolism (HCC)    Stroke [facility])     Past Surgical History:  Procedure Laterality Date   APPLICATION OF WOUND VAC Right 04/12/2021   Procedure: APPLICATION OF WOUND VAC;  Surgeon: Elsa Lonni SAUNDERS, MD;  Location: [facility] OR;  Service: Orthopedics;  Laterality: Right;   I & D EXTREMITY Right 04/06/2021   Procedure: IRRIGATION AND DEBRIDEMENT TOE WITH WOUND VAC PLACEMENT;  Surgeon: Elsa Lonni SAUNDERS, MD;  Location: [facility] OR;  Service: Orthopedics;  Laterality: Right;   I & D EXTREMITY Right 04/12/2021   Procedure: IRRIGATION AND DEBRIDEMENT RIGHT FOOT;  Surgeon: Elsa Lonni SAUNDERS, MD;  Location[facility] OR;  Service: Orthopedics;  Laterality: Right;   IR FLUORO GUIDE CV LINE RIGHT  10/01/2023   IR FLUORO GUIDE CV LINE RIGHT  10/01/2023   IR FLUORO GUIDE CV LINE RIGHT  10/02/2023   IR US  GUIDE VASC ACCESS RIGHT  10/01/2023   MOUTH SURGERY     ROBOTIC ASSITED PARTIAL NEPHRECTOMY Right 07/28/2019   Procedure: XI ROBOTIC ASSITED PARTIAL NEPHRECTOMY;  Surgeon: Sherrilee Belvie CROME, MD;  Location: [facility] ORS;  Service: Urology;   Laterality: Right;  3 HRS   SECONDARY CLOSURE OF WOUND Right 06/18/2021   Procedure: SECONDARY CLOSURE OF WOUND tRANSMETATARSAL AMPUTAION;  Surgeon: Elsa Lonni SAUNDERS, MD;  Location: [facility] OR;  Service: Orthopedics;  Laterality: Right;   TRANSMETATARSAL AMPUTATION Right 04/12/2021   Procedure: TRANSMETATARSAL AMPUTATION;  Surgeon: Elsa Lonni SAUNDERS, MD;  Location: [facility] OR;  Service: Orthopedics;  Laterality: Right;    Allergies  Allergen Reactions   Amlodipine Swelling    Pelvis swells   Insulin  Glargine Other (See Comments) and Hives   Humalog  Kwikpen [Insulin  Lispro] Other (See Comments)    75/50---swelling and ulcers in mouth     Prior to Admission medications   Medication Sig Start Date End Date Taking? Authorizing Provider  carvedilol  (COREG ) 6.25 MG tablet Take by mouth. 08/04/22  Yes [provider]  hydrALAZINE  (APRESOLINE ) 100 MG tablet Take by mouth. 08/04/22  Yes [provider]  insulin  degludec (TRESIBA  FLEXTOUCH) 200 UNIT/ML FlexTouch Pen INJECT 62 UNITS SUBCUTANEOUSLY ONCE DAILY 11/19/22  Yes [provider]  isosorbide  mononitrate (IMDUR ) 30 MG 24 hr tablet Take by mouth. 08/05/22  Yes [provider]  MULTIPLE VITAMINS PO Take by mouth.   Yes [provider]  nitroGLYCERIN  (NITROSTAT ) 0.4 MG SL tablet Take by mouth. 04/17/19  Yes [provider]  NOVOLOG  FLEXPEN 100 UNIT/ML FlexPen Inject 8 Units into the skin See admin instructions. INJECT 8UNITS INTO SKIN  BEFORE MEALS*IF YOUR BLOOD GLUCOSE >200,THEN INJECT 12 UNITS   Yes [provider]  omeprazole  (PRILOSEC) 20 MG capsule TAKE 1 CAPSULE (20 MG TOTAL) BY MOUTH 2 (TWO) TIMES DAILY BEFORE A MEAL. 04/14/23  Yes Hope Almarie ORN, NP  rosuvastatin  (CRESTOR ) 40 MG tablet Take 1 tablet (40 mg total) by mouth daily. 01/29/21  Yes Dennise Lavada POUR, MD  Vitamin D , Ergocalciferol , (DRISDOL ) 1.25 MG (50000 UNIT) CAPS capsule Take 50,000 Units by mouth once a week. 03/19/21  Yes  [provider]  XARELTO  20 MG TABS tablet Take 20 mg by mouth daily. 10/15/20  Yes [provider]  Continuous Blood Gluc Receiver (DEXCOM G6 RECEIVER) DEVI by Does not apply route. Patient not taking: Reported on 11/13/2022 08/13/22   [provider]  Continuous Blood Gluc Sensor (DEXCOM G6 SENSOR) MISC by Does not apply route. Patient not taking: Reported on 11/13/2022 08/13/22   [provider]  Continuous Blood Gluc Transmit (DEXCOM G6 TRANSMITTER) MISC by Does not apply route. Patient not taking: Reported on 11/13/2022 08/13/22   [provider]  glucose blood test strip Use as instructed 06/06/16   Dhungel, Nishant, MD  Insulin  Syringe-Needle U-100 (INSULIN  SYRINGE 1CC/28G) 28G X 1/2 1 ML MISC 1 application by Does not apply route 3 (three) times daily before meals. 06/06/16   Dhungel, Barnetta, MD    Social History   Socioeconomic History   Marital status: Married    Spouse name: Not on file   Number of children: Not on file   Years of education: Not on file   Highest education level: Not on file  Occupational History   Not on file  Tobacco Use   Smoking status: Never   Smokeless tobacco: Never  Vaping Use   Vaping status: Never Used  Substance and Sexual Activity   Alcohol  use: Yes    Alcohol /week: 1.0 standard drink of alcohol     Types: 1 Glasses of wine per week    Comment: occas   Drug use: No   Sexual activity: Yes    Birth control/protection: None  Other Topics Concern   Not on file  Social History Narrative   Not on file   Social Drivers of Health   Financial Resource Strain: Low Risk  (01/14/2023)   Received from [facility], [facility]   Overall Financial Resource Strain (CARDIA)    Difficulty of Paying Living Expenses: Not hard at all  Food Insecurity: No Food Insecurity (01/14/2023)   Received from [facility], [facility]   Hunger Vital Sign    Worried About Running Out of Food in the Last Year: Never  true    Ran Out of Food in the Last Year: Never true  Transportation Needs: No Transportation Needs (01/14/2023)   Received from [facility], [facility]   PRAPARE - Transportation    Lack of Transportation (Medical): No    Lack of Transportation (Non-Medical): No  Physical Activity: Unknown (05/07/2022)   [facility]   Exercise Vital Sign    Days of Exercise per Week: Not on file    Minutes of Exercise per Session: 10 min  Stress: Stress Concern Present (08/01/2022)   Received from [facility], [facility] - Occupational Stress Questionnaire    Feeling of Stress : Very much  Social Connections: Unknown (05/10/2023)   Received from [facility]   Social Network    Social Network: Not  on file  Intimate Partner Violence: Unknown (05/10/2023)   Received from [facility]   HITS    Physically Hurt: Not on file    Insult or Talk Down To: Not on file    Threaten Physical Harm: Not on file    Scream or Curse: Not on file    Family History  Problem Relation Age of Onset   High blood pressure Mother    Diabetes Mother    Arthritis Mother    Hyperlipidemia Mother    Heart disease Mother    High blood pressure Father    Hyperlipidemia Father    Dementia Father    High blood pressure Sister    Diabetes Brother    Hyperlipidemia Brother    High blood pressure Brother    Colon cancer Neg Hx    Stomach cancer Neg Hx    Esophageal cancer Neg Hx    Pancreatic cancer Neg Hx    Liver disease Neg Hx     ROS: Otherwise negative unless mentioned in HPI  Physical Examination  Vitals:   10/02/23 1400 10/02/23 1422  BP: 139/83 (!) 156/94  Pulse: 93   Resp: 16   Temp:  98.1 F (36.7 C)  SpO2: 99%    Body mass index is 37.68 kg/m.  General: no acute distress Cardiac: hemodynamically stable, nontachycardic Pulm: normal work of breathing  ASSESSMENT/PLAN: This is a 43 y.o. female in  need of urgent dialysis due to acute renal failure with anasarca and a pericardial effusion who has had multiple failures of her right IJ line despite multiple exchanges by IR. The imaging from her IR exchanges were reviewed which demonstrated excellent placement into the right atrium and is unclear why her mind continues to fail as she is also on therapeutic Lovenox  for history of DVT PE.   -Explained that our only remaining option is to place a femoral line.  Risks and benefits were reviewed with the patient and she elected to proceed. -Will proceed with femoral tunnel and dialysis catheter placement in the Cath Lab.   Norman GORMAN Serve MD MS [facility] [phone] 10/02/2023  3:15 PM

## 2023-10-03 DIAGNOSIS — N179 Acute kidney failure, unspecified: Secondary | ICD-10-CM | POA: Diagnosis not present

## 2023-10-03 LAB — CBC WITH DIFFERENTIAL/PLATELET
Abs Immature Granulocytes: 0.24 10*3/uL — ABNORMAL HIGH (ref 0.00–0.07)
Basophils Absolute: 0 10*3/uL (ref 0.0–0.1)
Basophils Relative: 0 %
Eosinophils Absolute: 1.2 10*3/uL — ABNORMAL HIGH (ref 0.0–0.5)
Eosinophils Relative: 9 %
HCT: 18.7 % — ABNORMAL LOW (ref 36.0–46.0)
Hemoglobin: 6 g/dL — CL (ref 12.0–15.0)
Immature Granulocytes: 2 %
Lymphocytes Relative: 7 %
Lymphs Abs: 1.1 10*3/uL (ref 0.7–4.0)
MCH: 28.8 pg (ref 26.0–34.0)
MCHC: 32.1 g/dL (ref 30.0–36.0)
MCV: 89.9 fL (ref 80.0–100.0)
Monocytes Absolute: 1.1 10*3/uL — ABNORMAL HIGH (ref 0.1–1.0)
Monocytes Relative: 7 %
Neutro Abs: 10.7 10*3/uL — ABNORMAL HIGH (ref 1.7–7.7)
Neutrophils Relative %: 75 %
Platelets: 171 10*3/uL (ref 150–400)
RBC: 2.08 MIL/uL — ABNORMAL LOW (ref 3.87–5.11)
RDW: 14.5 % (ref 11.5–15.5)
WBC: 14.3 10*3/uL — ABNORMAL HIGH (ref 4.0–10.5)
nRBC: 0 % (ref 0.0–0.2)

## 2023-10-03 LAB — COMPREHENSIVE METABOLIC PANEL
ALT: 15 U/L (ref 0–44)
AST: 19 U/L (ref 15–41)
Albumin: 1.7 g/dL — ABNORMAL LOW (ref 3.5–5.0)
Alkaline Phosphatase: 96 U/L (ref 38–126)
Anion gap: 13 (ref 5–15)
BUN: 69 mg/dL — ABNORMAL HIGH (ref 6–20)
CO2: 14 mmol/L — ABNORMAL LOW (ref 22–32)
Calcium: 6.7 mg/dL — ABNORMAL LOW (ref 8.9–10.3)
Chloride: 112 mmol/L — ABNORMAL HIGH (ref 98–111)
Creatinine, Ser: 12.7 mg/dL — ABNORMAL HIGH (ref 0.44–1.00)
GFR, Estimated: 3 mL/min — ABNORMAL LOW (ref >60)
Glucose, Bld: 105 mg/dL — ABNORMAL HIGH (ref 70–99)
Potassium: 4.6 mmol/L (ref 3.5–5.1)
Sodium: 139 mmol/L (ref 135–145)
Total Bilirubin: 0.6 mg/dL (ref 0.0–1.2)
Total Protein: 4.6 g/dL — ABNORMAL LOW (ref 6.5–8.1)

## 2023-10-03 LAB — PROCALCITONIN: Procalcitonin: 0.74 ng/mL

## 2023-10-03 LAB — PREPARE RBC (CROSSMATCH)

## 2023-10-03 LAB — MAGNESIUM: Magnesium: 1.9 mg/dL (ref 1.7–2.4)

## 2023-10-03 LAB — GLUCOSE, CAPILLARY
Glucose-Capillary: 84 mg/dL (ref 70–99)
Glucose-Capillary: 73 mg/dL (ref 70–99)
Glucose-Capillary: 111 mg/dL — ABNORMAL HIGH (ref 70–99)
Glucose-Capillary: 171 mg/dL — ABNORMAL HIGH (ref 70–99)

## 2023-10-03 LAB — C-REACTIVE PROTEIN: CRP: 10 mg/dL — ABNORMAL HIGH (ref <1.0)

## 2023-10-03 LAB — BRAIN NATRIURETIC PEPTIDE: B Natriuretic Peptide: 490.9 pg/mL — ABNORMAL HIGH (ref 0.0–100.0)

## 2023-10-03 LAB — PHOSPHORUS: Phosphorus: 8.2 mg/dL — ABNORMAL HIGH (ref 2.5–4.6)

## 2023-10-03 MED ORDER — DIPHENHYDRAMINE HCL 50 MG/ML IJ SOLN
12.5000 mg | Freq: Once | INTRAMUSCULAR | Status: AC
  Administered 2023-10-03: 12.5 mg via INTRAVENOUS
  Filled 2023-10-03: qty 1

## 2023-10-03 MED ORDER — DIPHENHYDRAMINE HCL 50 MG/ML IJ SOLN
12.5000 mg | Freq: Three times a day (TID) | INTRAMUSCULAR | Status: DC | PRN
  Administered 2023-10-03 – 2023-10-06 (×7): 12.5 mg via INTRAVENOUS
  Filled 2023-10-03 (×8): qty 1

## 2023-10-03 MED ORDER — SODIUM CHLORIDE 0.9% IV SOLUTION: Freq: Once | INTRAVENOUS | Status: AC

## 2023-10-03 MED ORDER — BOOST / RESOURCE BREEZE PO LIQD CUSTOM
1.0000 | Freq: Two times a day (BID) | ORAL | Status: DC
  Administered 2023-10-03 – 2023-10-04 (×2): 1 via ORAL

## 2023-10-03 MED ORDER — HEPARIN SODIUM (PORCINE) 1000 UNIT/ML IJ SOLN
6200.0000 [IU] | INTRAMUSCULAR | Status: DC | PRN
  Administered 2023-10-03 – 2023-10-06 (×2): 6200 [IU]
  Filled 2023-10-03 (×2): qty 7
  Filled 2023-10-03: qty 6.2

## 2023-10-03 NOTE — Progress Notes (Signed)
 Patient is now off the floor for dialysis

## 2023-10-03 NOTE — Progress Notes (Signed)
 Received patient in bed to unit.  Alert and oriented.  Informed consent signed and in chart.   TX duration:3 hours  Patient tolerated well.  Transported back to the room  Alert, without acute distress.  Hand-off given to patient's nurse.   Access used:Right Femoral HD Cath Access issues: none  Total UF removed: Medication(s) given: Nausea medicine, see MAR   10/03/23 0722  Vitals  Temp 98.6 F (37 C)  Temp Source Oral  BP (!) 168/72  MAP (mmHg) 99  Pulse Rate 99  ECG Heart Rate 100  Resp 18  Oxygen Therapy  SpO2 100 %  O2 Device Nasal Cannula  O2 Flow Rate (L/min) 1 L/min  During Treatment Monitoring  HD Safety Checks Performed Yes  Intra-Hemodialysis Comments Tx completed;Tolerated well  Dialysis Fluid Bolus Normal Saline  Bolus Amount (mL) 300 mL  Post Treatment  Dialyzer Clearance Lightly streaked  Liters Processed 45  Fluid Removed (mL) 500 mL  Tolerated HD Treatment Yes  Hemodialysis Catheter Right Femoral vein Double lumen Permanent (Tunneled)  Placement Date/Time: 10/02/23 1612   Placed prior to admission: No  Serial / Lot #: [id number]  Expiration Date: 01/27/28  Time Out: Correct patient;Correct site;Correct procedure  Maximum sterile barrier precautions: Hand hygiene;Cap;Mask;Sterile gown...  Site Condition No complications  Blue Lumen Status Flushed;Heparin  locked;Dead end cap in place  Red Lumen Status Flushed;Heparin  locked;Dead end cap in place  Purple Lumen Status N/A  Catheter fill solution Heparin  1000 units/ml  Catheter fill volume (Arterial) 3.1 cc  Catheter fill volume (Venous) 3.1  Dressing Type Gauze/Drain sponge  Dressing Status Other (Comment);Old drainage (deaccessed)  Interventions Dressing changed;Antimicrobial disc changed  Drainage Description None  Dressing Change Due 10/10/23  Post treatment catheter status Capped and Clamped     Camellia Brasil LPN [facility]

## 2023-10-03 NOTE — Plan of Care (Signed)
  Problem: Health Behavior/Discharge Planning: Goal: Ability to manage health-related needs will improve Outcome: Progressing   Problem: Skin Integrity: Goal: Risk for impaired skin integrity will decrease Outcome: Progressing   Problem: Education: Goal: Knowledge of General Education information will improve Description: Including pain rating scale, medication(s)/side effects and non-pharmacologic comfort measures Outcome: Progressing   Problem: Clinical Measurements: Goal: Will remain free from infection Outcome: Progressing

## 2023-10-03 NOTE — Progress Notes (Signed)
 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 She has evidence of volume overload with anasarca with pleural effusions and small amount of ascites.  We discussed the role for IV diuretics, as well as the likelihood that she will need to initiate dialysis during this hospitalization if her renal function does not improve.  She is having mild uremic symptoms with anorexia, dysgeusia, and refractory edema.  Will plan for IV lasix  and follow UOP and Scr.    - nontunneled HD cath  - VVS placed [facility] 10/02/23  - HD #1 finally completed 10/03/23 Anasarca - has history of chronic diastolic CHF.  Repeat ECHO performed today and awaiting results.  IV lasix  as above. Hypocalcemia - repleted with IV calcium .  She was started on calcitriol  0.25 mcg daily in September.  Her iPTH was 327 at that time.  Anemia of CKD stage IV-V - Hgb was 10.4 on 06/09/23. 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  H/o Right RCC s/p partial right nephrectomy 2020 - no evidence of recurrence or mets on CT of abd/pelvis. HTN/volume- as above continue with home bp meds and IV lasix  for diuresis.  Metabolic acidosis - will restart po sodium bicarbonate .   Subjective:    TDC placed by VVS after internal jugular cath not working- greatly appreciate VVS.  Finished HD at around 0700 this AM.  Itching better, feeling better.      Objective:   BP (!) 145/86   Pulse 88   Temp 98.1 F (36.7 C) (Oral)   Resp 18   Wt 102.2 kg   LMP 09/25/2023   SpO2 100%   BMI 37.49 kg/m   Intake/Output Summary (Last 24 hours) at 10/03/2023 1637 Last data filed at 10/03/2023 1606 Gross per 24 hour  Intake  607 ml  Output 500 ml  Net 107 ml   Weight change:   Physical Exam: Gen:NAD, lying in bed CVS: RRR Resp: clear Abd: soft Ext: 3+ anasarca ACCESS: R fem TDC  Imaging: IR Fluoro Guide CV Line Right Result Date: 10/02/2023 INDICATION: 43 year old female with nonfunctional temporary right IJ hemodialysis catheter EXAM: IMAGE GUIDED EXCHANGE OF RIGHT IJ TEMPORARY HEMODIALYSIS CATHETER MEDICATIONS: NONE ANESTHESIA/SEDATION: Moderate (conscious) sedation was NOT employed during this procedure. FLUOROSCOPY: Radiation Exposure Index (as provided by the fluoroscopic device): 1.0 mGy Kerma COMPLICATIONS: None PROCEDURE: Informed written consent was obtained from the patient after a discussion of the risks, benefits, and alternatives to treatment. Questions regarding the procedure were encouraged and answered. The right neck and chest, including the indwelling catheter were prepped with chlorhexidine  in a sterile fashion, and a sterile drape was applied covering the operative field. Maximum barrier sterile technique with sterile gowns and gloves were used for the procedure. A timeout was performed prior to the initiation of the procedure. Scout image was acquired. Wire was passed through the catheter into the IVC under fluoroscopy. The suture was ligated at the neck. And the 20 cm catheter was exchanged for a 16 cm temporary triple-lumen hemodialysis catheter. Wire was removed and a final image was stored demonstrating that the catheter was present within the upper right  atrium. The catheter was flushed with appropriate volume heparin  dwells. 1% lidocaine  was used for local anesthesia. The catheter exit site was secured with a 0-Prolene retention suture. Dressings were applied. The patient tolerated the procedure well without immediate post procedural complication. IMPRESSION: Status post image guided exchange of right IJ temporary hemodialysis catheter. Signed, Ami RAMAN. Alona ROSALEA GRAVER, RPVI [facility] Radiology Electronically Signed   By: Ami Alona D.O.   On: 10/02/2023 09:51   IR Fluoro Guide CV Line Right Result Date: 10/02/2023 INDICATION: 43 year old female with history of acute kidney injury status post temporary hemodialysis catheter earlier the same day which was not functioning during hemodialysis. The patient presents for catheter check and exchange. EXAM: Temporary CENTRAL VENOUS HEMODIALYSIS CATHETER REPLACEMENT WITH FLUOROSCOPIC GUIDANCE MEDICATIONS: None FLUOROSCOPY TIME:  One mGy COMPLICATIONS: None immediate. PROCEDURE: Informed written consent was obtained from the patient after a discussion of the risks, benefits, and alternatives to treatment. Questions regarding the procedure were encouraged and answered. The skin and external portion of the existing hemodialysis catheter was prepped with chlorhexidine  in a sterile fashion, and a sterile drape was applied covering the operative field. Maximum barrier sterile technique with sterile gowns and gloves were used for the procedure. A timeout was performed prior to the initiation of the procedure. The central lumen of the indwelling hemodialysis catheter aspirated and flushed without difficulty. The red lumen would not aspirate. The blue lumen aspirated and flushed without difficulty. Fluoroscopic evaluation demonstrated the catheter tip in the superior aspect of the right atrium. Local anesthetic was applied to the catheter entry site with 1% lidocaine . The central lumen was cannulated with a Rosen wire was directed under fluoroscopic guidance to the inferior vena cava. The catheter was removed and exchanged for a new, 20 cm Trialysis catheter. The catheter tip was positioned in the inferior aspect of the right atrium. All 3 lumens flushed and aspirated without difficulty. The catheter was flushed with appropriate volume heparin  dwell as. The catheter was affixed to the skin with an interrupted 0  silk suture. A sterile bandage was applied. The patient tolerated the procedure well without immediate post procedural complication. IMPRESSION: Successful exchange of indwelling non tunneled hemodialysis catheter for 20 cm Trialysis catheter with tips terminating within the right atrium. The catheter is ready for immediate use. Ester Sides, MD [facility] Radiology Electronically Signed   By: Ester Sides M.D.   On: 10/02/2023 07:54    Labs: BMET Recent Labs  Lab 09/28/23 0602 09/29/23 0450 09/30/23 0527 10/01/23 0447 10/01/23 1510 10/02/23 0616 10/03/23 0334  NA 136 139 141 138 138 138 139  K 4.2 4.7 4.9 4.8 4.8 4.9 4.6  CL 111 114* 115* 111 113* 111 112*  CO2 12* 13* 13* 11* 12* 11* 14*  GLUCOSE 108* 80 110* 119* 201* 122* 105*  BUN 62* 66* 67* 68* 71* 68* 69*  CREATININE 12.38* 13.08* 13.41* 13.85* 14.17* 13.12* 12.70*  CALCIUM  6.2* 6.7* 6.8* 6.4* 6.4* 6.8* 6.7*  PHOS 8.6* 9.2* 9.4* 9.0* 9.1* 8.6* 8.2*   CBC Recent Labs  Lab 09/30/23 0527 10/01/23 0447 10/01/23 1510 10/01/23 1843 10/02/23 0616 10/03/23 0334  WBC 13.8* 15.9* 18.9* 19.3* 16.6* 14.3*  NEUTROABS 9.8* 13.3*  --   --  13.2* 10.7*  HGB 7.1* 7.5* 6.2* 6.9* 6.9* 6.0*  HCT 22.4* 23.6* 20.1* 21.7* 22.4* 18.7*  MCV 88.9 89.7 92.2 90.0 90.3 89.9  PLT 258 243 216 150 202 171  Medications:     sodium chloride    Intravenous Once   carvedilol   6.25 mg Oral BID WC   Chlorhexidine  Gluconate Cloth  6 each Topical Q0600   darbepoetin (ARANESP ) injection - NON-DIALYSIS  60 mcg Subcutaneous Q Fri-1800   docusate sodium   200 mg Oral BID   enoxaparin  (LOVENOX ) injection  80 mg Subcutaneous QHS   feeding supplement  1 Container Oral BID BM   hydrALAZINE   50 mg Oral Q8H   hydrocerin   Topical BID   insulin  aspart  0-6 Units Subcutaneous TID WC   isosorbide  mononitrate  30 mg Oral Daily   pantoprazole   40 mg Oral Daily   rosuvastatin   5 mg Oral Daily   sodium  bicarbonate  650 mg Oral TID    Almarie Bonine MD 10/03/2023, 4:37 PM

## 2023-10-03 NOTE — Progress Notes (Signed)
 PROGRESS NOTE                                                                                                                                                                                                             Patient Demographics:    Sherri Dixon, is a 43 y.o. female, DOB - [birth year], FMW:[id number]  Outpatient Primary MD for the patient is Arby Lyle LABOR, NP    LOS - 5  Admit date - 09/27/2023    Chief Complaint  Patient presents with   Leg Swelling       Brief Narrative (HPI from H&P)   43 y.o. 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 She was volume overloaded and hypertensive in the ED with AKI (Cr 12.57) with acidosis (bicarb 12), also anemic with hgb 6.7g/dl. CT abd/pelvis demonstrated small volume ascites, diffuse anasarca, pericardial effusion which was confirmed to be moderate without tamponade physiology by echo. Nephrology was consulted, IV lasix  started and patient admitted to [facility] to be prepared if HD initiation is required.    Subjective:   Patient in bed denies any headache chest or abdominal pain, still mildly confused and she realizes that, no focal weakness.   Assessment  & Plan :    AKI on stage IV CKD with NAGMA : Suspect NSAID-induced injury and/or progressive CKD (has been rising Cr for months). UA bland, 6-10 WBCs, no RBCs, no mention of casts. FENa is 4.7%.  Neurology on board, being treated with high-dose IV Lasix , CT abdomen pelvis nonacute, IR placed right IJ HD catheter twice however it did not have good return and patient could not undergo HD with it, vascular surgery was consulted and underwent right femoral dialysis catheter placement on 10/02/2023, first HD  done early morning 10/03/2023.  Still little uremic, likely will require few more treatments before she gets better.    Anasarca, acute on chronic HFpEF: Echo with LVEF 60-65%, mod LVH, G1DD, and elevated estimate LVEDP. RV has normal size/function, normal PASP.  See above.   Acute on chronic blood loss anemia and anemia of CKD: No longer menstruating.  Hgb up to 7.4g/dl  today s/p 1u RBCs 12/30. Will continue monitoring, keep T&S up to date. Defer ESA to nephrology.  Giving 2 units of packed RBCs on 10/03/2023 with HD, continue to monitor H&H.[name]    Pericardial effusion: No RV collapse on echo, pt actually hypertensive. Up to 1.9cm by echo. IVC not plethoric/dilated. Has had small pericardial effusion dating at least to April 2022.  Could have uremic pericarditis, repeat echo in 2 to 3 weeks.   History of DVT/PE: LE venous U/S without DVT this admission. No hypoxemia, pleuritic CP and normal est. PASP by echo.   Anticipate need to transition to eliquis , currently holding xarelto  and giving ppx heparin  in preparation for possible TDC   HTN: With significant HTN here.  Continue coreg , hydralazine , isosorbide  with goal of gradual BP reduction.    Hypocalcemia: Supplemented.    Pruritus - prn antihistamine, prn topical eucerin and/or aquaphor.  Definitive management may be HD.   Nausea - prn zofran , prn compazine .    History of CVA:  - Continue statin (dose decreased for renal impairment)   History of right RCC s/p partial nephrectomy: CT showed no recurrence or signs of metastases.    Nonspecific somewhat chronic leukocytosis.  Afebrile, stable chest x-ray and UA.  Monitor.    Obesity: Body mass index is 38.85 kg/m.  Follow-up with PCP.  T2DM with peripheral neuropathy: HbA1c is 6.1% -  though this appears to be from posttransfusion specimen, and is falsely depressed in setting of anemia anyway. PCP note shows HbA1c 8.6% in June 2024 at which time Cr was 4.3.  - Continue SSI  CBG (last 3)   Recent Labs    10/02/23 0810 10/02/23 2124 10/03/23 0821  GLUCAP 100* 140* 84   Lab Results  Component Value Date   HGBA1C 6.1 (H) 09/28/2023         Condition - Fair  Family Communication  :  None  Code Status :  Full  Consults  :  Renal  PUD Prophylaxis :    Procedures  :     Right femoral HD catheter placed by vascular surgery on 10/02/2023.    Right IJ HD catheter placed by IR on 10/01/2023, replaced on 10/02/2023.    TTE -  1. Left ventricular ejection fraction, by estimation, is 60 to 65%. The left ventricle has normal function. The left ventricle has no regional wall motion abnormalities. There is moderate concentric left ventricular hypertrophy. Left ventricular diastolic parameters are consistent with Grade I diastolic dysfunction (impaired relaxation). Elevated left ventricular end-diastolic pressure.  2. Right ventricular systolic function is normal. The right ventricular size is normal. There is normal pulmonary artery systolic pressure.  3. Left atrial size was severely dilated.  4. Pericardial effusion measuring up to 1.9 cm. Moderate pericardial effusion. There is no evidence of cardiac tamponade.  5. The mitral valve is normal in structure. No evidence of mitral valve regurgitation. No evidence of mitral stenosis.  6. The aortic valve is tricuspid. Aortic valve regurgitation is not visualized. No aortic stenosis is present.  7. The inferior vena cava is normal in size with greater than 50% respiratory variability, suggesting right atrial pressure of 3 mmHg.  CT - 1. Postsurgical change in the upper right kidney. No evidence of recurrent or metastatic disease in the abdomen/pelvis. 2. Hepatomegaly. 3. Small volume abdominopelvic ascites. Generalized subcutaneous body wall edema. 4. Moderate circumferential pericardial effusion. 5. Mild wall thickening about the anterior bladder, may be due to underdistention or cystitis. Recommend correlation  with urinalysis. 6. Right  lower ventral abdominal wall hernia containing fat and small amount of ascites. Aortic Atherosclerosis (ICD10-I70.0)      Disposition Plan  :    Status is: Inpatient   DVT Prophylaxis  :    Place and maintain sequential compression device Start: 09/28/23 1115    Lab Results  Component Value Date   PLT 171 10/03/2023    Diet :  Diet Order             Diet renal with fluid restriction Fluid restriction: 1200 mL Fluid; Room service appropriate? Yes; Fluid consistency: Thin  Diet effective now                    Inpatient Medications  Scheduled Meds:  sodium chloride    Intravenous Once   sodium chloride    Intravenous Once   carvedilol   6.25 mg Oral BID WC   Chlorhexidine  Gluconate Cloth  6 each Topical Q0600   darbepoetin (ARANESP ) injection - NON-DIALYSIS  60 mcg Subcutaneous Q Fri-1800   docusate sodium   200 mg Oral BID   enoxaparin  (LOVENOX ) injection  80 mg Subcutaneous QHS   hydrALAZINE   50 mg Oral Q8H   hydrocerin   Topical BID   insulin  aspart  0-6 Units Subcutaneous TID WC   isosorbide  mononitrate  30 mg Oral Daily   pantoprazole   40 mg Oral Daily   rosuvastatin   5 mg Oral Daily   sodium bicarbonate   650 mg Oral TID   Continuous Infusions:   PRN Meds:.acetaminophen , diphenhydrAMINE , heparin  sodium (porcine), hydrALAZINE , mineral oil-hydrophilic petrolatum, nitroGLYCERIN , ondansetron  (ZOFRAN ) IV, polyethylene glycol, prochlorperazine , sodium chloride  flush, sodium chloride  flush  Antibiotics  :    Anti-infectives (From admission, onward)    None         Objective:   Vitals:   10/03/23 0700 10/03/23 0722 10/03/23 0731 10/03/23 0900  BP: 116/73 (!) 168/72  (!) 140/123  Pulse: 94 99  95  Resp: 16 18    Temp:  98.6 F (37 C)    TempSrc:  Oral    SpO2: 100% 100%  100%  Weight:   102.2 kg     Wt Readings from Last 3 Encounters:  10/03/23 102.2 kg  04/15/23 105.2 kg  03/10/23 108.9 kg     Intake/Output Summary (Last 24 hours) at  10/03/2023 1015 Last data filed at 10/03/2023 9277 Gross per 24 hour  Intake --  Output 500 ml  Net -500 ml     Physical Exam  Awake but mildly confused, No new F.N deficits, Normal affect [place].AT,PERRAL Supple Neck, No JVD,   Symmetrical Chest wall movement, Good air movement bilaterally, CTAB RRR,No Gallops,Rubs or new Murmurs,  +ve B.Sounds, Abd Soft, No tenderness,   1+ edema, right foot under bandage, right IJ HD catheter, right femoral HD catheter        Data Review:    Recent Labs  Lab 09/30/23 0527 10/01/23 0447 10/01/23 1510 10/01/23 1843 10/02/23 0616 10/03/23 0334  WBC 13.8* 15.9* 18.9* 19.3* 16.6* 14.3*  HGB 7.1* 7.5* 6.2* 6.9* 6.9* 6.0*  HCT 22.4* 23.6* 20.1* 21.7* 22.4* 18.7*  PLT 258 243 216 150 202 171  MCV 88.9 89.7 92.2 90.0 90.3 89.9  MCH 28.2 28.5 28.4 28.6 27.8 28.8  MCHC 31.7 31.8 30.8 31.8 30.8 32.1  RDW 14.3 14.4 14.6 14.5 14.6 14.5  LYMPHSABS 1.4 0.6*  --   --  1.0 1.1  MONOABS 1.0 0.8  --   --  1.2* 1.1*  EOSABS 1.5* 1.0*  --   --  1.0* 1.2*  BASOSABS 0.0 0.0  --   --  0.0 0.0    Recent Labs  Lab 09/28/23 0602 09/28/23 0654 09/29/23 0450 09/30/23 0527 10/01/23 0447 10/01/23 1510 10/01/23 1519 10/02/23 0616 10/03/23 0334  NA 136  --  139 141 138 138  --  138 139  K 4.2  --  4.7 4.9 4.8 4.8  --  4.9 4.6  CL 111  --  114* 115* 111 113*  --  111 112*  CO2 12*  --  13* 13* 11* 12*  --  11* 14*  ANIONGAP 13  --  12 13 16* 13  --  16* 13  GLUCOSE 108*  --  80 110* 119* 201*  --  122* 105*  BUN 62*  --  66* 67* 68* 71*  --  68* 69*  CREATININE 12.38*  --  13.08* 13.41* 13.85* 14.17*  --  13.12* 12.70*  AST 16  17  --  15 16 14*  --   --  18 19  ALT 14  14  --  13 15 14   --   --  10 15  ALKPHOS 98  100  --  98 99 103  --   --  103 96  BILITOT 0.5  0.6  --  0.6 0.4 0.7  --   --  0.6 0.6  ALBUMIN  2.4*  2.4*  --  2.0* 2.1* 2.1* 1.7*  --  1.8* 1.7*  CRP  --   --   --   --   --  4.7*  --  11.7* 10.0*  PROCALCITON  --   --   --   --    --   --  0.41 0.74 0.74  INR  --   --   --  1.1  --   --   --   --   --   HGBA1C  --  6.1*  --   --   --   --   --   --   --   BNP 378.5*  --   --  717.8* 590.8*  --   --  509.8* 490.9*  MG 1.9  --  1.8 1.9 2.0  --   --  1.8 1.9  CALCIUM  6.2*  --  6.7* 6.8* 6.4* 6.4*  --  6.8* 6.7*      Recent Labs  Lab 09/28/23 0602 09/28/23 0654 09/29/23 0450 09/30/23 0527 10/01/23 0447 10/01/23 1510 10/01/23 1519 10/02/23 0616 10/03/23 0334  CRP  --   --   --   --   --  4.7*  --  11.7* 10.0*  PROCALCITON  --   --   --   --   --   --  0.41 0.74 0.74  INR  --   --   --  1.1  --   --   --   --   --   HGBA1C  --  6.1*  --   --   --   --   --   --   --   BNP 378.5*  --   --  717.8* 590.8*  --   --  509.8* 490.9*  MG 1.9  --  1.8 1.9 2.0  --   --  1.8 1.9  CALCIUM  6.2*  --  6.7* 6.8* 6.4* 6.4*  --  6.8* 6.7*    Lab Results  Component Value Date   HGBA1C 6.1 (H) 09/28/2023   No results for input(s): TSH, T4TOTAL, FREET4, T3FREE, THYROIDAB in the last 72 hours. No results for input(s): VITAMINB12, FOLATE, FERRITIN, TIBC, IRON, RETICCTPCT in the last 72 hours.  ------------------------------------------------------------------------------------------------------------------ Cardiac Enzymes No results for input(s): CKMB, TROPONINI, MYOGLOBIN in the last 168 hours.  Invalid input(s): CK  Micro Results No results found for this or any previous visit (from the past 240 hours).  Radiology Reports IR Fluoro Guide CV Line Right Result Date: 10/02/2023 INDICATION: 43 year old female with nonfunctional temporary right IJ hemodialysis catheter EXAM: IMAGE GUIDED EXCHANGE OF RIGHT IJ TEMPORARY HEMODIALYSIS CATHETER MEDICATIONS: NONE ANESTHESIA/SEDATION: Moderate (conscious) sedation was NOT employed during this procedure. FLUOROSCOPY: Radiation Exposure Index (as provided by the fluoroscopic device): 1.0 mGy Kerma COMPLICATIONS: None PROCEDURE: Informed written consent was  obtained from the patient after a discussion of the risks, benefits, and alternatives to treatment. Questions regarding the procedure were encouraged and answered. The right neck and chest, including the indwelling catheter were prepped with chlorhexidine  in a sterile fashion, and a sterile drape was applied covering the operative field. Maximum barrier sterile technique with sterile gowns and gloves were used for the procedure. A timeout was performed prior to the initiation of the procedure. Scout image was acquired. Wire was passed through the catheter into the IVC under fluoroscopy. The suture was ligated at the neck. And the 20 cm catheter was exchanged for a 16 cm temporary triple-lumen hemodialysis catheter. Wire was removed and a final image was stored demonstrating that the catheter was present within the upper right atrium. The catheter was flushed with appropriate volume heparin  dwells. 1% lidocaine  was used for local anesthesia. The catheter exit site was secured with a 0-Prolene retention suture. Dressings were applied. The patient tolerated the procedure well without immediate post procedural complication. IMPRESSION: Status post image guided exchange of right IJ temporary hemodialysis catheter. Signed, Ami RAMAN. Alona ROSALEA GRAVER, RPVI [facility] Radiology Electronically Signed   By: Ami Alona D.O.   On: 10/02/2023 09:51   IR Fluoro Guide CV Line Right Result Date: 10/02/2023 INDICATION: 43 year old female with history of acute kidney injury status post temporary hemodialysis catheter earlier the same day which was not functioning during hemodialysis. The patient presents for catheter check and exchange. EXAM: Temporary CENTRAL VENOUS HEMODIALYSIS CATHETER REPLACEMENT WITH FLUOROSCOPIC GUIDANCE MEDICATIONS: None FLUOROSCOPY TIME:  One mGy COMPLICATIONS: None immediate. PROCEDURE: Informed written consent was obtained from the patient after a  discussion of the risks, benefits, and alternatives to treatment. Questions regarding the procedure were encouraged and answered. The skin and external portion of the existing hemodialysis catheter was prepped with chlorhexidine  in a sterile fashion, and a sterile drape was applied covering the operative field. Maximum barrier sterile technique with sterile gowns and gloves were used for the procedure. A timeout was performed prior to the initiation of the procedure. The central lumen of the indwelling hemodialysis catheter aspirated and flushed without difficulty. The red lumen would not aspirate. The blue lumen aspirated and flushed without difficulty. Fluoroscopic evaluation demonstrated the catheter tip in the superior aspect of the right atrium. Local anesthetic was applied to the catheter entry site with 1% lidocaine . The central lumen was cannulated with a Rosen wire was directed under fluoroscopic guidance to the inferior vena cava. The catheter was removed and exchanged for a new, 20 cm Trialysis catheter. The catheter tip was positioned in the inferior aspect of the right atrium. All 3  lumens flushed and aspirated without difficulty. The catheter was flushed with appropriate volume heparin  dwell as. The catheter was affixed to the skin with an interrupted 0 silk suture. A sterile bandage was applied. The patient tolerated the procedure well without immediate post procedural complication. IMPRESSION: Successful exchange of indwelling non tunneled hemodialysis catheter for 20 cm Trialysis catheter with tips terminating within the right atrium. The catheter is ready for immediate use. Ester Sides, MD [facility] Radiology Electronically Signed   By: Ester Sides M.D.   On: 10/02/2023 07:54   IR Fluoro Guide CV Line Right Result Date: 10/01/2023 CLINICAL DATA:  Acute kidney injury and need for hemodialysis. EXAM: NON-TUNNELED CENTRAL VENOUS HEMODIALYSIS  CATHETER PLACEMENT WITH ULTRASOUND AND FLUOROSCOPIC GUIDANCE FLUOROSCOPY: 6 seconds.  3.0 mGy. PROCEDURE: The procedure, risks, benefits, and alternatives were explained to the patient. Questions regarding the procedure were encouraged and answered. The patient understands and consents to the procedure. A time-out was performed prior to initiating the procedure. The right neck and chest were prepped with chlorhexidine  in a sterile fashion, and a sterile drape was applied covering the operative field. Maximum barrier sterile technique with sterile gowns and gloves were used for the procedure. Local anesthesia was provided with 1% lidocaine . Ultrasound was used to confirm patency of the right internal jugular vein. An ultrasound image was saved recorded. After creating a small venotomy incision, a 21 gauge needle was advanced into the right internal jugular vein under direct, real-time ultrasound guidance. Ultrasound image documentation was performed. After securing guidewire access venous access was dilated over the wire. A 16 cm, 13 French triple-lumen Mahurkar non tunneled hemodialysis catheter was advanced over the wire. Final catheter positioning was confirmed and documented with a fluoroscopic spot image. The catheter was aspirated, flushed with saline, and injected with appropriate volume heparin  dwells. The catheter exit site was secured with 0-Prolene retention sutures. COMPLICATIONS: None.  No pneumothorax. FINDINGS: After catheter placement, the tip lies in the right atrium. The catheter aspirates normally and is ready for immediate use. IMPRESSION: Placement of non-tunneled central venous hemodialysis catheter via the right internal jugular vein. The catheter tip lies in the right atrium. The catheter is ready for immediate use. Electronically Signed   By: Marcey Moan M.D.   On: 10/01/2023 12:44   IR US  Guide Vasc Access Right Result Date: 10/01/2023 CLINICAL DATA:  Acute kidney injury and need for  hemodialysis. EXAM: NON-TUNNELED CENTRAL VENOUS HEMODIALYSIS CATHETER PLACEMENT WITH ULTRASOUND AND FLUOROSCOPIC GUIDANCE FLUOROSCOPY: 6 seconds.  3.0 mGy. PROCEDURE: The procedure, risks, benefits, and alternatives were explained to the patient. Questions regarding the procedure were encouraged and answered. The patient understands and consents to the procedure. A time-out was performed prior to initiating the procedure. The right neck and chest were prepped with chlorhexidine  in a sterile fashion, and a sterile drape was applied covering the operative field. Maximum barrier sterile technique with sterile gowns and gloves were used for the procedure. Local anesthesia was provided with 1% lidocaine . Ultrasound was used to confirm patency of the right internal jugular vein. An ultrasound image was saved recorded. After creating a small venotomy incision, a 21 gauge needle was advanced into the right internal jugular vein under direct, real-time ultrasound guidance. Ultrasound image documentation was performed. After securing guidewire access venous access was dilated over the wire. A 16 cm, 13 French triple-lumen Mahurkar non tunneled hemodialysis catheter was advanced over the wire. Final catheter positioning was confirmed and documented with a fluoroscopic  spot image. The catheter was aspirated, flushed with saline, and injected with appropriate volume heparin  dwells. The catheter exit site was secured with 0-Prolene retention sutures. COMPLICATIONS: None.  No pneumothorax. FINDINGS: After catheter placement, the tip lies in the right atrium. The catheter aspirates normally and is ready for immediate use. IMPRESSION: Placement of non-tunneled central venous hemodialysis catheter via the right internal jugular vein. The catheter tip lies in the right atrium. The catheter is ready for immediate use. Electronically Signed   By: Marcey Moan M.D.   On: 10/01/2023 12:44   DG Chest Port 1 View Result Date:  09/30/2023 CLINICAL DATA:  Shortness of breath.  CHF. EXAM: PORTABLE CHEST 1 VIEW COMPARISON:  12/12/2020 FINDINGS: The cardio pericardial silhouette is enlarged. The lungs are clear without focal pneumonia, edema, pneumothorax or pleural effusion. No acute bony abnormality. IMPRESSION: Enlargement of the cardiopericardial silhouette without acute cardiopulmonary findings. Electronically Signed   By: Camellia Candle M.D.   On: 09/30/2023 08:58      Signature  -   Lavada Stank M.D on 10/03/2023 at 10:15 AM   -  To page go to [email or web address]

## 2023-10-04 DIAGNOSIS — N179 Acute kidney failure, unspecified: Secondary | ICD-10-CM | POA: Diagnosis not present

## 2023-10-04 LAB — PROCALCITONIN: Procalcitonin: 0.72 ng/mL

## 2023-10-04 LAB — AMMONIA: Ammonia: 28 umol/L (ref 9–35)

## 2023-10-04 LAB — COMPREHENSIVE METABOLIC PANEL
ALT: 16 U/L (ref 0–44)
AST: 16 U/L (ref 15–41)
Albumin: 1.8 g/dL — ABNORMAL LOW (ref 3.5–5.0)
Alkaline Phosphatase: 104 U/L (ref 38–126)
Anion gap: 13 (ref 5–15)
BUN: 44 mg/dL — ABNORMAL HIGH (ref 6–20)
CO2: 21 mmol/L — ABNORMAL LOW (ref 22–32)
Calcium: 6.5 mg/dL — ABNORMAL LOW (ref 8.9–10.3)
Chloride: 103 mmol/L (ref 98–111)
Creatinine, Ser: 9.15 mg/dL — ABNORMAL HIGH (ref 0.44–1.00)
GFR, Estimated: 5 mL/min — ABNORMAL LOW (ref >60)
Glucose, Bld: 194 mg/dL — ABNORMAL HIGH (ref 70–99)
Potassium: 4 mmol/L (ref 3.5–5.1)
Sodium: 137 mmol/L (ref 135–145)
Total Bilirubin: 0.7 mg/dL (ref 0.0–1.2)
Total Protein: 4.9 g/dL — ABNORMAL LOW (ref 6.5–8.1)

## 2023-10-04 LAB — CBC WITH DIFFERENTIAL/PLATELET
Abs Immature Granulocytes: 0.38 10*3/uL — ABNORMAL HIGH (ref 0.00–0.07)
Basophils Absolute: 0 10*3/uL (ref 0.0–0.1)
Basophils Relative: 0 %
Eosinophils Absolute: 1.3 10*3/uL — ABNORMAL HIGH (ref 0.0–0.5)
Eosinophils Relative: 9 %
HCT: 26.1 % — ABNORMAL LOW (ref 36.0–46.0)
Hemoglobin: 8.7 g/dL — ABNORMAL LOW (ref 12.0–15.0)
Immature Granulocytes: 2 %
Lymphocytes Relative: 6 %
Lymphs Abs: 0.9 10*3/uL (ref 0.7–4.0)
MCH: 29.5 pg (ref 26.0–34.0)
MCHC: 33.3 g/dL (ref 30.0–36.0)
MCV: 88.5 fL (ref 80.0–100.0)
Monocytes Absolute: 1.1 10*3/uL — ABNORMAL HIGH (ref 0.1–1.0)
Monocytes Relative: 7 %
Neutro Abs: 11.9 10*3/uL — ABNORMAL HIGH (ref 1.7–7.7)
Neutrophils Relative %: 76 %
Platelets: 167 10*3/uL (ref 150–400)
RBC: 2.95 MIL/uL — ABNORMAL LOW (ref 3.87–5.11)
RDW: 14.6 % (ref 11.5–15.5)
WBC: 15.6 10*3/uL — ABNORMAL HIGH (ref 4.0–10.5)
nRBC: 0.1 % (ref 0.0–0.2)

## 2023-10-04 LAB — GLUCOSE, CAPILLARY
Glucose-Capillary: 157 mg/dL — ABNORMAL HIGH (ref 70–99)
Glucose-Capillary: 187 mg/dL — ABNORMAL HIGH (ref 70–99)
Glucose-Capillary: 118 mg/dL — ABNORMAL HIGH (ref 70–99)
Glucose-Capillary: 226 mg/dL — ABNORMAL HIGH (ref 70–99)

## 2023-10-04 LAB — C-REACTIVE PROTEIN: CRP: 7.8 mg/dL — ABNORMAL HIGH (ref <1.0)

## 2023-10-04 NOTE — Progress Notes (Signed)
 PROGRESS NOTE                                                                                                                                                                                                             Patient Demographics:    Sherri Dixon, is a 43 y.o. female, DOB - Nov 01, 1980, FMW:[id number]  Outpatient Primary MD for the patient is Arby Lyle LABOR, NP    LOS - 6  Admit date - 09/27/2023    Chief Complaint  Patient presents with   Leg Swelling       Brief Narrative (HPI from H&P)   43 y.o. 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 She was volume overloaded and hypertensive in the ED with AKI (Cr 12.57) with acidosis (bicarb 12), also anemic with hgb 6.7g/dl. CT abd/pelvis demonstrated small volume ascites, diffuse anasarca, pericardial effusion which was confirmed to be moderate without tamponade physiology by echo. Nephrology was consulted, IV lasix  started and patient admitted to [facility] to be prepared if HD initiation is required.    Subjective:   Patient in bed, appears comfortable, denies any headache, no fever, no chest pain or pressure, no shortness of breath , no abdominal pain. No new focal weakness.   Assessment  & Plan :    AKI on stage IV CKD with NAGMA : Suspect NSAID-induced injury and/or progressive CKD (has been rising Cr for months). UA bland, 6-10 WBCs, no RBCs, no mention of casts. FENa is 4.7%.  Neurology on board, being treated with high-dose IV Lasix , CT abdomen pelvis nonacute, IR placed right IJ HD catheter twice however it did not have good return and patient could not undergo HD with it, vascular surgery was consulted and underwent right femoral dialysis  catheter placement on 10/02/2023, first HD run done on 10/02/2022 with improvement in uremic symptoms.    Anasarca, acute on chronic HFpEF: Echo with LVEF 60-65%, mod LVH, G1DD, and elevated estimate LVEDP. RV has normal size/function, normal PASP.  See above.   Acute on chronic blood loss anemia and anemia of CKD: No longer menstruating.  No ongoing bleeding, received total 3  units of packed RBC this admission with 2 units on 10/03/2023, posttransfusion H&H stable.  Likely combination of anemia of chronic disease and hemodilution due to fluid overload prior to initiation of HD.  Pericardial effusion: No RV collapse on echo, pt actually hypertensive. Up to 1.9cm by echo. IVC not plethoric/dilated. Has had small pericardial effusion dating at least to April 2022.  Could have uremic pericarditis, repeat echo in 1-2 weeks.   History of DVT/PE: LE venous U/S without DVT this admission. No hypoxemia, pleuritic CP and normal est. PASP by echo.   Anticipate need to transition to eliquis , currently holding xarelto  and giving ppx heparin  in preparation for possible TDC   HTN: With significant HTN here.  Continue coreg , hydralazine , isosorbide  with goal of gradual BP reduction.    Hypocalcemia: Supplemented.    Pruritus - prn antihistamine, prn topical eucerin and/or aquaphor.  Definitive management may be HD.   Nausea - prn zofran , prn compazine .    History of CVA:  - Continue statin (dose decreased for renal impairment)   History of right RCC s/p partial nephrectomy: CT showed no recurrence or signs of metastases.    Nonspecific somewhat chronic leukocytosis.  Afebrile, stable chest x-ray and UA.  Monitor.    Obesity: Body mass index is 38.85 kg/m.  Follow-up with PCP.  T2DM with peripheral neuropathy: HbA1c is 6.1% -  though this appears to be from posttransfusion specimen, and is falsely depressed in setting of anemia anyway. PCP note shows HbA1c 8.6% in June 2024 at which time Cr was 4.3.  -  Continue SSI  CBG (last 3)  Recent Labs    10/03/23 1637 10/03/23 2136 10/04/23 0806  GLUCAP 111* 171* 157*   Lab Results  Component Value Date   HGBA1C 6.1 (H) 09/28/2023         Condition - Fair  Family Communication  : Husband bedside on 10/04/2023  Code Status :  Full  Consults  :  Renal  PUD Prophylaxis :    Procedures  :     Right femoral HD catheter placed by vascular surgery on 10/02/2023.    Right IJ HD catheter placed by IR on 10/01/2023, replaced on 10/02/2023.    TTE -  1. Left ventricular ejection fraction, by estimation, is 60 to 65%. The left ventricle has normal function. The left ventricle has no regional wall motion abnormalities. There is moderate concentric left ventricular hypertrophy. Left ventricular diastolic parameters are consistent with Grade I diastolic dysfunction (impaired relaxation). Elevated left ventricular end-diastolic pressure.  2. Right ventricular systolic function is normal. The right ventricular size is normal. There is normal pulmonary artery systolic pressure.  3. Left atrial size was severely dilated.  4. Pericardial effusion measuring up to 1.9 cm. Moderate pericardial effusion. There is no evidence of cardiac tamponade.  5. The mitral valve is normal in structure. No evidence of mitral valve regurgitation. No evidence of mitral stenosis.  6. The aortic valve is tricuspid. Aortic valve regurgitation is not visualized. No aortic stenosis is present.  7. The inferior vena cava is normal in size with greater than 50% respiratory variability, suggesting right atrial pressure of 3 mmHg.  CT - 1. Postsurgical change in the upper right kidney. No evidence of recurrent or metastatic disease in the abdomen/pelvis. 2. Hepatomegaly. 3. Small volume abdominopelvic ascites. Generalized subcutaneous body wall edema. 4. Moderate circumferential pericardial effusion. 5. Mild wall thickening about the anterior bladder, may be due to underdistention or cystitis.  Recommend correlation  with urinalysis. 6. Right lower ventral abdominal wall hernia containing fat and small amount of ascites. Aortic Atherosclerosis (ICD10-I70.0)      Disposition Plan  :    Status is: Inpatient   DVT Prophylaxis  :    Place and maintain sequential compression device Start: 09/28/23 1115    Lab Results  Component Value Date   PLT 167 10/04/2023    Diet :  Diet Order             Diet renal with fluid restriction Fluid restriction: 1200 mL Fluid; Room service appropriate? Yes; Fluid consistency: Thin  Diet effective now                    Inpatient Medications  Scheduled Meds:  sodium chloride    Intravenous Once   carvedilol   6.25 mg Oral BID WC   Chlorhexidine  Gluconate Cloth  6 each Topical Q0600   darbepoetin (ARANESP ) injection - NON-DIALYSIS  60 mcg Subcutaneous Q Fri-1800   docusate sodium   200 mg Oral BID   enoxaparin  (LOVENOX ) injection  80 mg Subcutaneous QHS   feeding supplement  1 Container Oral BID BM   hydrALAZINE   50 mg Oral Q8H   hydrocerin   Topical BID   insulin  aspart  0-6 Units Subcutaneous TID WC   isosorbide  mononitrate  30 mg Oral Daily   pantoprazole   40 mg Oral Daily   rosuvastatin   5 mg Oral Daily   sodium bicarbonate   650 mg Oral TID   Continuous Infusions:   PRN Meds:.acetaminophen , diphenhydrAMINE , heparin  sodium (porcine), hydrALAZINE , mineral oil-hydrophilic petrolatum, nitroGLYCERIN , ondansetron  (ZOFRAN ) IV, polyethylene glycol, prochlorperazine , sodium chloride  flush, sodium chloride  flush  Antibiotics  :    Anti-infectives (From admission, onward)    None         Objective:   Vitals:   10/04/23 0044 10/04/23 0415 10/04/23 0515 10/04/23 0516  BP: (!) 160/79  (!) 160/87 (!) 160/87  Pulse: 80  82   Resp: 18  18   Temp: 98.5 F (36.9 C)  98.2 F (36.8 C)   TempSrc: Oral  Oral   SpO2: 96%  95%   Weight:  105.4 kg      Wt Readings from Last 3 Encounters:  10/04/23 105.4 kg  04/15/23 105.2 kg   03/10/23 108.9 kg     Intake/Output Summary (Last 24 hours) at 10/04/2023 0923 Last data filed at 10/03/2023 1645 Gross per 24 hour  Intake 917 ml  Output --  Net 917 ml     Physical Exam  Awake but mildly confused, No new F.N deficits, Normal affect .AT,PERRAL Supple Neck, No JVD,   Symmetrical Chest wall movement, Good air movement bilaterally, CTAB RRR,No Gallops,Rubs or new Murmurs,  +ve B.Sounds, Abd Soft, No tenderness,   1+ edema, right foot under bandage, right IJ HD catheter, right femoral HD catheter     Data Review:    Recent Labs  Lab 09/30/23 0527 10/01/23 0447 10/01/23 1510 10/01/23 1843 10/02/23 0616 10/03/23 0334 10/04/23 0418  WBC 13.8* 15.9* 18.9* 19.3* 16.6* 14.3* 15.6*  HGB 7.1* 7.5* 6.2* 6.9* 6.9* 6.0* 8.7*  HCT 22.4* 23.6* 20.1* 21.7* 22.4* 18.7* 26.1*  PLT 258 243 216 150 202 171 167  MCV 88.9 89.7 92.2 90.0 90.3 89.9 88.5  MCH 28.2 28.5 28.4 28.6 27.8 28.8 29.5  MCHC 31.7 31.8 30.8 31.8 30.8 32.1 33.3  RDW 14.3 14.4 14.6 14.5 14.6 14.5 14.6  LYMPHSABS 1.4 0.6*  --   --  1.0 1.1 0.9  MONOABS 1.0 0.8  --   --  1.2* 1.1* 1.1*  EOSABS 1.5* 1.0*  --   --  1.0* 1.2* 1.3*  BASOSABS 0.0 0.0  --   --  0.0 0.0 0.0    Recent Labs  Lab 09/28/23 0602 09/28/23 0654 09/29/23 0450 09/30/23 0527 10/01/23 0447 10/01/23 1510 10/01/23 1519 10/02/23 0616 10/03/23 0334 10/04/23 0418  NA 136  --  139 141 138 138  --  138 139 137  K 4.2  --  4.7 4.9 4.8 4.8  --  4.9 4.6 4.0  CL 111  --  114* 115* 111 113*  --  111 112* 103  CO2 12*  --  13* 13* 11* 12*  --  11* 14* 21*  ANIONGAP 13  --  12 13 16* 13  --  16* 13 13  GLUCOSE 108*  --  80 110* 119* 201*  --  122* 105* 194*  BUN 62*  --  66* 67* 68* 71*  --  68* 69* 44*  CREATININE 12.38*  --  13.08* 13.41* 13.85* 14.17*  --  13.12* 12.70* 9.15*  AST 16  17  --  15 16 14*  --   --  18 19 16   ALT 14  14  --  13 15 14   --   --  10 15 16   ALKPHOS 98  100  --  98 99 103  --   --  103 96 104  BILITOT  0.5  0.6  --  0.6 0.4 0.7  --   --  0.6 0.6 0.7  ALBUMIN  2.4*  2.4*  --  2.0* 2.1* 2.1* 1.7*  --  1.8* 1.7* 1.8*  CRP  --   --   --   --   --  4.7*  --  11.7* 10.0* 7.8*  PROCALCITON  --   --   --   --   --   --  0.41 0.74 0.74 0.72  INR  --   --   --  1.1  --   --   --   --   --   --   HGBA1C  --  6.1*  --   --   --   --   --   --   --   --   BNP 378.5*  --   --  717.8* 590.8*  --   --  509.8* 490.9*  --   MG 1.9  --  1.8 1.9 2.0  --   --  1.8 1.9  --   CALCIUM  6.2*  --  6.7* 6.8* 6.4* 6.4*  --  6.8* 6.7* 6.5*      Recent Labs  Lab 09/28/23 0602 09/28/23 0654 09/29/23 0450 09/30/23 0527 10/01/23 0447 10/01/23 1510 10/01/23 1519 10/02/23 0616 10/03/23 0334 10/04/23 0418  CRP  --   --   --   --   --  4.7*  --  11.7* 10.0* 7.8*  PROCALCITON  --   --   --   --   --   --  0.41 0.74 0.74 0.72  INR  --   --   --  1.1  --   --   --   --   --   --   HGBA1C  --  6.1*  --   --   --   --   --   --   --   --  BNP 378.5*  --   --  717.8* 590.8*  --   --  509.8* 490.9*  --   MG 1.9  --  1.8 1.9 2.0  --   --  1.8 1.9  --   CALCIUM  6.2*  --  6.7* 6.8* 6.4* 6.4*  --  6.8* 6.7* 6.5*    Lab Results  Component Value Date   HGBA1C 6.1 (H) 09/28/2023   No results for input(s): TSH, T4TOTAL, FREET4, T3FREE, THYROIDAB in the last 72 hours. No results for input(s): VITAMINB12, FOLATE, FERRITIN, TIBC, IRON, RETICCTPCT in the last 72 hours.  ------------------------------------------------------------------------------------------------------------------ Cardiac Enzymes No results for input(s): CKMB, TROPONINI, MYOGLOBIN in the last 168 hours.  Invalid input(s): CK  Micro Results No results found for this or any previous visit (from the past 240 hours).  Radiology Reports IR Fluoro Guide CV Line Right Result Date: 10/02/2023 INDICATION: 43 year old female with nonfunctional temporary right IJ hemodialysis catheter EXAM: IMAGE GUIDED EXCHANGE OF RIGHT IJ  TEMPORARY HEMODIALYSIS CATHETER MEDICATIONS: NONE ANESTHESIA/SEDATION: Moderate (conscious) sedation was NOT employed during this procedure. FLUOROSCOPY: Radiation Exposure Index (as provided by the fluoroscopic device): 1.0 mGy Kerma COMPLICATIONS: None PROCEDURE: Informed written consent was obtained from the patient after a discussion of the risks, benefits, and alternatives to treatment. Questions regarding the procedure were encouraged and answered. The right neck and chest, including the indwelling catheter were prepped with chlorhexidine  in a sterile fashion, and a sterile drape was applied covering the operative field. Maximum barrier sterile technique with sterile gowns and gloves were used for the procedure. A timeout was performed prior to the initiation of the procedure. Scout image was acquired. Wire was passed through the catheter into the IVC under fluoroscopy. The suture was ligated at the neck. And the 20 cm catheter was exchanged for a 16 cm temporary triple-lumen hemodialysis catheter. Wire was removed and a final image was stored demonstrating that the catheter was present within the upper right atrium. The catheter was flushed with appropriate volume heparin  dwells. 1% lidocaine  was used for local anesthesia. The catheter exit site was secured with a 0-Prolene retention suture. Dressings were applied. The patient tolerated the procedure well without immediate post procedural complication. IMPRESSION: Status post image guided exchange of right IJ temporary hemodialysis catheter. Signed, Ami RAMAN. Alona ROSALEA GRAVER, RPVI [facility] Radiology Electronically Signed   By: Ami Alona D.O.   On: 10/02/2023 09:51   IR Fluoro Guide CV Line Right Result Date: 10/02/2023 INDICATION: 43 year old female with history of acute kidney injury status post temporary hemodialysis catheter earlier the same day which was not functioning during hemodialysis. The  patient presents for catheter check and exchange. EXAM: Temporary CENTRAL VENOUS HEMODIALYSIS CATHETER REPLACEMENT WITH FLUOROSCOPIC GUIDANCE MEDICATIONS: None FLUOROSCOPY TIME:  One mGy COMPLICATIONS: None immediate. PROCEDURE: Informed written consent was obtained from the patient after a discussion of the risks, benefits, and alternatives to treatment. Questions regarding the procedure were encouraged and answered. The skin and external portion of the existing hemodialysis catheter was prepped with chlorhexidine  in a sterile fashion, and a sterile drape was applied covering the operative field. Maximum barrier sterile technique with sterile gowns and gloves were used for the procedure. A timeout was performed prior to the initiation of the procedure. The central lumen of the indwelling hemodialysis catheter aspirated and flushed without difficulty. The red lumen would not aspirate. The blue lumen aspirated and flushed without difficulty. Fluoroscopic evaluation demonstrated the catheter tip in the superior aspect  of the right atrium. Local anesthetic was applied to the catheter entry site with 1% lidocaine . The central lumen was cannulated with a Rosen wire was directed under fluoroscopic guidance to the inferior vena cava. The catheter was removed and exchanged for a new, 20 cm Trialysis catheter. The catheter tip was positioned in the inferior aspect of the right atrium. All 3 lumens flushed and aspirated without difficulty. The catheter was flushed with appropriate volume heparin  dwell as. The catheter was affixed to the skin with an interrupted 0 silk suture. A sterile bandage was applied. The patient tolerated the procedure well without immediate post procedural complication. IMPRESSION: Successful exchange of indwelling non tunneled hemodialysis catheter for 20 cm Trialysis catheter with tips terminating within the right atrium. The catheter is ready for immediate use. Ester Sides, MD [facility] Radiology Electronically Signed   By: Ester Sides M.D.   On: 10/02/2023 07:54   IR Fluoro Guide CV Line Right Result Date: 10/01/2023 CLINICAL DATA:  Acute kidney injury and need for hemodialysis. EXAM: NON-TUNNELED CENTRAL VENOUS HEMODIALYSIS CATHETER PLACEMENT WITH ULTRASOUND AND FLUOROSCOPIC GUIDANCE FLUOROSCOPY: 6 seconds.  3.0 mGy. PROCEDURE: The procedure, risks, benefits, and alternatives were explained to the patient. Questions regarding the procedure were encouraged and answered. The patient understands and consents to the procedure. A time-out was performed prior to initiating the procedure. The right neck and chest were prepped with chlorhexidine  in a sterile fashion, and a sterile drape was applied covering the operative field. Maximum barrier sterile technique with sterile gowns and gloves were used for the procedure. Local anesthesia was provided with 1% lidocaine . Ultrasound was used to confirm patency of the right internal jugular vein. An ultrasound image was saved recorded. After creating a small venotomy incision, a 21 gauge needle was advanced into the right internal jugular vein under direct, real-time ultrasound guidance. Ultrasound image documentation was performed. After securing guidewire access venous access was dilated over the wire. A 16 cm, 13 French triple-lumen Mahurkar non tunneled hemodialysis catheter was advanced over the wire. Final catheter positioning was confirmed and documented with a fluoroscopic spot image. The catheter was aspirated, flushed with saline, and injected with appropriate volume heparin  dwells. The catheter exit site was secured with 0-Prolene retention sutures. COMPLICATIONS: None.  No pneumothorax. FINDINGS: After catheter placement, the tip lies in the right atrium. The catheter aspirates normally and is ready for immediate use. IMPRESSION: Placement of non-tunneled central venous hemodialysis catheter via  the right internal jugular vein. The catheter tip lies in the right atrium. The catheter is ready for immediate use. Electronically Signed   By: Marcey Moan M.D.   On: 10/01/2023 12:44   IR US  Guide Vasc Access Right Result Date: 10/01/2023 CLINICAL DATA:  Acute kidney injury and need for hemodialysis. EXAM: NON-TUNNELED CENTRAL VENOUS HEMODIALYSIS CATHETER PLACEMENT WITH ULTRASOUND AND FLUOROSCOPIC GUIDANCE FLUOROSCOPY: 6 seconds.  3.0 mGy. PROCEDURE: The procedure, risks, benefits, and alternatives were explained to the patient. Questions regarding the procedure were encouraged and answered. The patient understands and consents to the procedure. A time-out was performed prior to initiating the procedure. The right neck and chest were prepped with chlorhexidine  in a sterile fashion, and a sterile drape was applied covering the operative field. Maximum barrier sterile technique with sterile gowns and gloves were used for the procedure. Local anesthesia was provided with 1% lidocaine . Ultrasound was used to confirm patency of the right internal jugular vein. An ultrasound image was saved recorded. After creating  a small venotomy incision, a 21 gauge needle was advanced into the right internal jugular vein under direct, real-time ultrasound guidance. Ultrasound image documentation was performed. After securing guidewire access venous access was dilated over the wire. A 16 cm, 13 French triple-lumen Mahurkar non tunneled hemodialysis catheter was advanced over the wire. Final catheter positioning was confirmed and documented with a fluoroscopic spot image. The catheter was aspirated, flushed with saline, and injected with appropriate volume heparin  dwells. The catheter exit site was secured with 0-Prolene retention sutures. COMPLICATIONS: None.  No pneumothorax. FINDINGS: After catheter placement, the tip lies in the right atrium. The catheter aspirates normally and is ready for immediate use. IMPRESSION:  Placement of non-tunneled central venous hemodialysis catheter via the right internal jugular vein. The catheter tip lies in the right atrium. The catheter is ready for immediate use. Electronically Signed   By: Marcey Moan M.D.   On: 10/01/2023 12:44      Signature  -   Lavada Stank M.D on 10/04/2023 at 9:23 AM   -  To page go to [email or web address]

## 2023-10-04 NOTE — Plan of Care (Signed)
  Problem: Coping: Goal: Ability to adjust to condition or change in health will improve Outcome: Progressing   Problem: Health Behavior/Discharge Planning: Goal: Ability to manage health-related needs will improve Outcome: Progressing   Problem: Clinical Measurements: Goal: Will remain free from infection Outcome: Progressing   Problem: Coping: Goal: Level of anxiety will decrease Outcome: Progressing

## 2023-10-04 NOTE — Progress Notes (Signed)
 [facility] Progress Note   Assessment/ Plan:    AKI/CKD stage IV vs rapidly progressive CKD stage V - It appears that she has had several episodes of AKI/CKD with ARB and SGLT-2 inhibitors in the past, but her Scr really started to climb when her diuretics were increased in March 2024. She has evidence of volume overload with anasarca with pleural effusions and small amount of ascites.  We discussed the role for IV diuretics, as well as the likelihood that she will need to initiate dialysis during this hospitalization if her renal function does not improve.  She is having mild uremic symptoms with anorexia, dysgeusia, and refractory edema.  Will plan for IV lasix  and follow UOP and Scr.    - nontunneled HD cath  - VVS placed R [facility] 10/02/23  - HD #1 finally completed 10/03/23  - HD #2 10/05/23, will need CLIP and perm access Anasarca - has history of chronic diastolic CHF.  Repeat ECHO performed today and awaiting results.  IV lasix  as above. Hypocalcemia - repleted with IV calcium .  She was started on calcitriol  0.25 mcg daily in September.  Her iPTH was 327 at that time.  Anemia of CKD stage IV-V - Hgb was 10.4 on 06/09/23. 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  H/o Right RCC s/p partial right nephrectomy 2020 - no evidence of recurrence or mets on CT of abd/pelvis. HTN/volume- as above continue with home bp meds and IV lasix  for diuresis.  Metabolic acidosis - will restart po sodium bicarbonate .   Subjective:    Seen in room.  Nontunneled line removed by IV team. Appreciate.  For HD #2 tomorrow   Objective:   BP (!) 177/81 (BP Location: Left Arm)   Pulse 82   Temp 98.2 F (36.8 C) (Oral)   Resp 18   Wt 105.4 kg   LMP 09/25/2023   SpO2 95%   BMI 38.67 kg/m   Intake/Output Summary (Last 24 hours) at 10/04/2023 1329 Last data filed at 10/03/2023 1645 Gross per 24 hour  Intake  917 ml  Output --  Net 917 ml   Weight change:   Physical Exam: Gen:NAD, lying in bed CVS: RRR Resp: clear Abd: soft Ext: 3+ anasarca ACCESS: R fem TDC  Imaging: No results found.   Labs: BMET Recent Labs  Lab 09/28/23 0602 09/29/23 0450 09/30/23 0527 10/01/23 0447 10/01/23 1510 10/02/23 0616 10/03/23 0334 10/04/23 0418  NA 136 139 141 138 138 138 139 137  K 4.2 4.7 4.9 4.8 4.8 4.9 4.6 4.0  CL 111 114* 115* 111 113* 111 112* 103  CO2 12* 13* 13* 11* 12* 11* 14* 21*  GLUCOSE 108* 80 110* 119* 201* 122* 105* 194*  BUN 62* 66* 67* 68* 71* 68* 69* 44*  CREATININE 12.38* 13.08* 13.41* 13.85* 14.17* 13.12* 12.70* 9.15*  CALCIUM  6.2* 6.7* 6.8* 6.4* 6.4* 6.8* 6.7* 6.5*  PHOS 8.6* 9.2* 9.4* 9.0* 9.1* 8.6* 8.2*  --    CBC Recent Labs  Lab 10/01/23 0447 10/01/23 1510 10/01/23 1843 10/02/23 0616 10/03/23 0334 10/04/23 0418  WBC 15.9*   < > 19.3* 16.6* 14.3* 15.6*  NEUTROABS 13.3*  --   --  13.2* 10.7* 11.9*  HGB 7.5*   < > 6.9* 6.9* 6.0* 8.7*  HCT 23.6*   < > 21.7* 22.4* 18.7* 26.1*  MCV 89.7   < > 90.0  90.3 89.9 88.5  PLT 243   < > 150 202 171 167   < > = values in this interval not displayed.    Medications:     sodium chloride    Intravenous Once   carvedilol   6.25 mg Oral BID WC   Chlorhexidine  Gluconate Cloth  6 each Topical Q0600   darbepoetin (ARANESP ) injection - NON-DIALYSIS  60 mcg Subcutaneous Q Fri-1800   docusate sodium   200 mg Oral BID   enoxaparin  (LOVENOX ) injection  80 mg Subcutaneous QHS   feeding supplement  1 Container Oral BID BM   hydrALAZINE   50 mg Oral Q8H   hydrocerin   Topical BID   insulin  aspart  0-6 Units Subcutaneous TID WC   isosorbide  mononitrate  30 mg Oral Daily   pantoprazole   40 mg Oral Daily   rosuvastatin   5 mg Oral Daily   sodium bicarbonate   650 mg Oral TID    Almarie Bonine MD 10/04/2023, 1:29 PM

## 2023-10-05 ENCOUNTER — Inpatient Hospital Stay (HOSPITAL_COMMUNITY): Payer: 59

## 2023-10-05 ENCOUNTER — Encounter (HOSPITAL_COMMUNITY): Payer: Self-pay | Admitting: Vascular Surgery

## 2023-10-05 DIAGNOSIS — N179 Acute kidney failure, unspecified: Secondary | ICD-10-CM | POA: Diagnosis not present

## 2023-10-05 LAB — CBC WITH DIFFERENTIAL/PLATELET
Abs Immature Granulocytes: 0.43 10*3/uL — ABNORMAL HIGH (ref 0.00–0.07)
Basophils Absolute: 0 10*3/uL (ref 0.0–0.1)
Basophils Relative: 0 %
Eosinophils Absolute: 1.9 10*3/uL — ABNORMAL HIGH (ref 0.0–0.5)
Eosinophils Relative: 13 %
HCT: 25.7 % — ABNORMAL LOW (ref 36.0–46.0)
Hemoglobin: 8.6 g/dL — ABNORMAL LOW (ref 12.0–15.0)
Immature Granulocytes: 3 %
Lymphocytes Relative: 10 %
Lymphs Abs: 1.6 10*3/uL (ref 0.7–4.0)
MCH: 29.7 pg (ref 26.0–34.0)
MCHC: 33.5 g/dL (ref 30.0–36.0)
MCV: 88.6 fL (ref 80.0–100.0)
Monocytes Absolute: 1.2 10*3/uL — ABNORMAL HIGH (ref 0.1–1.0)
Monocytes Relative: 8 %
Neutro Abs: 9.8 10*3/uL — ABNORMAL HIGH (ref 1.7–7.7)
Neutrophils Relative %: 66 %
Platelets: 173 10*3/uL (ref 150–400)
RBC: 2.9 MIL/uL — ABNORMAL LOW (ref 3.87–5.11)
RDW: 14.6 % (ref 11.5–15.5)
WBC: 14.9 10*3/uL — ABNORMAL HIGH (ref 4.0–10.5)
nRBC: 0.3 % — ABNORMAL HIGH (ref 0.0–0.2)

## 2023-10-05 LAB — TYPE AND SCREEN
ABO/RH(D): A POS
Antibody Screen: NEGATIVE
Unit division: 0
Unit division: 0
Unit division: 0

## 2023-10-05 LAB — AMMONIA: Ammonia: 19 umol/L (ref 9–35)

## 2023-10-05 LAB — C-REACTIVE PROTEIN: CRP: 8.8 mg/dL — ABNORMAL HIGH (ref <1.0)

## 2023-10-05 LAB — BPAM RBC
Blood Product Expiration Date: 202501272359
Blood Product Expiration Date: 202501272359
Blood Product Expiration Date: 202501272359
ISSUE DATE / TIME: 202501041112
ISSUE DATE / TIME: 202501041401
Unit Type and Rh: 202501272359
Unit Type and Rh: 6200
Unit Type and Rh: 6200
Unit Type and Rh: 6200

## 2023-10-05 LAB — GLUCOSE, CAPILLARY
Glucose-Capillary: 78 mg/dL (ref 70–99)
Glucose-Capillary: 157 mg/dL — ABNORMAL HIGH (ref 70–99)
Glucose-Capillary: 164 mg/dL — ABNORMAL HIGH (ref 70–99)
Glucose-Capillary: 112 mg/dL — ABNORMAL HIGH (ref 70–99)

## 2023-10-05 LAB — PROCALCITONIN: Procalcitonin: 0.68 ng/mL

## 2023-10-05 MED ORDER — CHLORHEXIDINE GLUCONATE CLOTH 2 % EX PADS: 6.0000 | MEDICATED_PAD | Freq: Every day | CUTANEOUS | Status: DC

## 2023-10-05 MED ORDER — RENA-VITE PO TABS
1.0000 | ORAL_TABLET | Freq: Every day | ORAL | Status: DC
  Administered 2023-10-05: 1 via ORAL
  Filled 2023-10-05: qty 1

## 2023-10-05 MED ORDER — CLOTRIMAZOLE 1 % EX CREA
TOPICAL_CREAM | Freq: Two times a day (BID) | CUTANEOUS | Status: AC
  Administered 2023-10-05 – 2023-10-06 (×2): 1 via TOPICAL
  Filled 2023-10-05: qty 15

## 2023-10-05 MED ORDER — CALCITRIOL 0.25 MCG PO CAPS
0.2500 ug | ORAL_CAPSULE | Freq: Every day | ORAL | Status: DC
  Administered 2023-10-05 – 2023-10-06 (×2): 0.25 ug via ORAL
  Filled 2023-10-05 (×2): qty 1

## 2023-10-05 MED ORDER — APIXABAN 5 MG PO TABS
5.0000 mg | ORAL_TABLET | Freq: Two times a day (BID) | ORAL | Status: DC
  Administered 2023-10-05 – 2023-10-06 (×2): 5 mg via ORAL
  Filled 2023-10-05 (×2): qty 1

## 2023-10-05 MED ORDER — HYDRALAZINE HCL 50 MG PO TABS
100.0000 mg | ORAL_TABLET | Freq: Three times a day (TID) | ORAL | Status: DC
  Administered 2023-10-05 – 2023-10-06 (×4): 100 mg via ORAL
  Filled 2023-10-05 (×4): qty 2

## 2023-10-05 MED ORDER — GLUCERNA SHAKE PO LIQD
237.0000 mL | Freq: Three times a day (TID) | ORAL | Status: DC
  Administered 2023-10-05 – 2023-10-06 (×5): 237 mL via ORAL

## 2023-10-05 MED ORDER — FUROSEMIDE 10 MG/ML IJ SOLN
80.0000 mg | Freq: Once | INTRAMUSCULAR | Status: AC
  Administered 2023-10-05: 80 mg via INTRAVENOUS
  Filled 2023-10-05: qty 8

## 2023-10-05 NOTE — Progress Notes (Addendum)
 PROGRESS NOTE                                                                                                                                                                                                             Patient Demographics:    Sherri Dixon, is a 43 y.o. female, DOB - 08/20/1981, FMW:[id number]  Outpatient Primary MD for the patient is Arby Lyle LABOR, NP    LOS - 7  Admit date - 09/27/2023    Chief Complaint  Patient presents with   Leg Swelling       Brief Narrative (HPI from H&P)   43 y.o. 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 She was volume overloaded and hypertensive in the ED with AKI (Cr 12.57) with acidosis (bicarb 12), also anemic with hgb 6.7g/dl. CT abd/pelvis demonstrated small volume ascites, diffuse anasarca, pericardial effusion which was confirmed to be moderate without tamponade physiology by echo. Nephrology was consulted, IV lasix  started and patient admitted to [facility] to be prepared if HD initiation is required.    Subjective:   Patient in bed, appears comfortable, denies any headache, no fever, no chest pain or pressure, no shortness of breath , no abdominal pain. No new focal weakness.    Assessment  & Plan :    AKI on stage IV CKD with NAGMA : Suspect NSAID-induced injury and/or progressive CKD (has been rising Cr for months). UA bland, 6-10 WBCs, no RBCs, no mention of casts. FENa is 4.7%.  Neurology on board, being treated with high-dose IV Lasix , CT abdomen pelvis nonacute, IR placed right IJ HD catheter twice however it did not have good return and patient could not undergo HD with it, vascular surgery was consulted and underwent right femoral dialysis  catheter placement on 10/02/2023, first HD run done on 10/02/2022 with improvement in uremic symptoms.    Anasarca, acute on chronic HFpEF: Echo with LVEF 60-65%, mod LVH, G1DD, and elevated estimate LVEDP. RV has normal size/function, normal PASP.  See above.   Acute on chronic blood loss anemia and anemia of CKD: No longer menstruating.  No ongoing bleeding, received total  3 units of packed RBC this admission with 2 units on 10/03/2023, posttransfusion H&H stable.  Likely combination of anemia of chronic disease and hemodilution due to fluid overload prior to initiation of HD.  Pericardial effusion: No RV collapse on echo, pt actually hypertensive. Up to 1.9cm by echo. IVC not plethoric/dilated. Has had small pericardial effusion dating at least to April 2022.  Could have uremic pericarditis, repeat echo on 10/06/2023.   History of DVT/PE: LE venous U/S without DVT this admission. No hypoxemia, pleuritic CP and normal est. PASP by echo.   Anticipate need to transition to eliquis , currently holding xarelto  and giving ppx heparin  in preparation for possible TDC   HTN: Continue coreg ,, Imdur , hydralazine  dose adjusted on 10/05/2023 for better control.    Hypocalcemia: Supplemented.    Pruritus - prn antihistamine, prn topical eucerin and/or aquaphor.  Definitive management may be HD.   Nausea - prn zofran , prn compazine .    History of CVA:  - Continue statin (dose decreased for renal impairment)   History of right RCC s/p partial nephrectomy: CT showed no recurrence or signs of metastases.    Nonspecific somewhat chronic leukocytosis.  Afebrile, stable chest x-ray and UA.  Monitor.    Obesity: Body mass index is 38.85 kg/m.  Follow-up with PCP.  T2DM with peripheral neuropathy: HbA1c is 6.1% -  though this appears to be from posttransfusion specimen, and is falsely depressed in setting of anemia anyway. PCP note shows HbA1c 8.6% in June 2024 at which time Cr was 4.3.  - Continue SSI  CBG (last 3)   Recent Labs    10/04/23 1533 10/04/23 2123 10/05/23 0752  GLUCAP 118* 226* 78   Lab Results  Component Value Date   HGBA1C 6.1 (H) 09/28/2023         Condition - Fair  Family Communication  : Husband bedside on 10/04/2023  Code Status :  Full  Consults  :  Renal  PUD Prophylaxis :    Procedures  :     Right femoral HD catheter placed by vascular surgery on 10/02/2023.    Right IJ HD catheter placed by IR on 10/01/2023, replaced on 10/02/2023.  Removed on 10/03/2023 as it was nonfunctional.  TTE -  1. Left ventricular ejection fraction, by estimation, is 60 to 65%. The left ventricle has normal function. The left ventricle has no regional wall motion abnormalities. There is moderate concentric left ventricular hypertrophy. Left ventricular diastolic parameters are consistent with Grade I diastolic dysfunction (impaired relaxation). Elevated left ventricular end-diastolic pressure.  2. Right ventricular systolic function is normal. The right ventricular size is normal. There is normal pulmonary artery systolic pressure.  3. Left atrial size was severely dilated.  4. Pericardial effusion measuring up to 1.9 cm. Moderate pericardial effusion. There is no evidence of cardiac tamponade.  5. The mitral valve is normal in structure. No evidence of mitral valve regurgitation. No evidence of mitral stenosis.  6. The aortic valve is tricuspid. Aortic valve regurgitation is not visualized. No aortic stenosis is present.  7. The inferior vena cava is normal in size with greater than 50% respiratory variability, suggesting right atrial pressure of 3 mmHg.  CT - 1. Postsurgical change in the upper right kidney. No evidence of recurrent or metastatic disease in the abdomen/pelvis. 2. Hepatomegaly. 3. Small volume abdominopelvic ascites. Generalized subcutaneous body wall edema. 4. Moderate circumferential pericardial effusion. 5. Mild wall thickening about the anterior bladder, may be due to underdistention  or cystitis.  Recommend correlation with urinalysis. 6. Right lower ventral abdominal wall hernia containing fat and small amount of ascites. Aortic Atherosclerosis (ICD10-I70.0)      Disposition Plan  :    Status is: Inpatient   DVT Prophylaxis  :    Place and maintain sequential compression device Start: 09/28/23 1115    Lab Results  Component Value Date   PLT 173 10/05/2023    Diet :  Diet Order             Diet renal with fluid restriction Fluid restriction: 1200 mL Fluid; Room service appropriate? Yes; Fluid consistency: Thin  Diet effective now                    Inpatient Medications  Scheduled Meds:  sodium chloride    Intravenous Once   carvedilol   6.25 mg Oral BID WC   Chlorhexidine  Gluconate Cloth  6 each Topical Q0600   darbepoetin (ARANESP ) injection - NON-DIALYSIS  60 mcg Subcutaneous Q Fri-1800   docusate sodium   200 mg Oral BID   enoxaparin  (LOVENOX ) injection  80 mg Subcutaneous QHS   feeding supplement  1 Container Oral BID BM   hydrALAZINE   100 mg Oral Q8H   hydrocerin   Topical BID   insulin  aspart  0-6 Units Subcutaneous TID WC   isosorbide  mononitrate  30 mg Oral Daily   pantoprazole   40 mg Oral Daily   rosuvastatin   5 mg Oral Daily   Continuous Infusions:   PRN Meds:.acetaminophen , diphenhydrAMINE , heparin  sodium (porcine), hydrALAZINE , mineral oil-hydrophilic petrolatum, nitroGLYCERIN , ondansetron  (ZOFRAN ) IV, polyethylene glycol, prochlorperazine , sodium chloride  flush, sodium chloride  flush  Antibiotics  :    Anti-infectives (From admission, onward)    None         Objective:   Vitals:   10/05/23 0454 10/05/23 0455 10/05/23 0457 10/05/23 0754  BP: (!) 178/85 (!) 178/85  (!) 164/73  Pulse: 88   85  Resp: 18   18  Temp: 97.9 F (36.6 C)   98.4 F (36.9 C)  TempSrc: Oral   Oral  SpO2: 100%   100%  Weight:   102.8 kg     Wt Readings from Last 3 Encounters:  10/05/23 102.8 kg  04/15/23 105.2 kg  03/10/23 108.9 kg     No intake or output data in the 24 hours ending 10/05/23 0912    Physical Exam  Awake but mildly confused, No new F.N deficits, Normal affect Impact.AT,PERRAL Supple Neck, No JVD,   Symmetrical Chest wall movement, Good air movement bilaterally, CTAB RRR,No Gallops,Rubs or new Murmurs,  +ve B.Sounds, Abd Soft, No tenderness,   1+ edema, right foot under bandage, right femoral HD catheter     Data Review:    Recent Labs  Lab 10/01/23 0447 10/01/23 1510 10/01/23 1843 10/02/23 0616 10/03/23 0334 10/04/23 0418 10/05/23 0514  WBC 15.9*   < > 19.3* 16.6* 14.3* 15.6* 14.9*  HGB 7.5*   < > 6.9* 6.9* 6.0* 8.7* 8.6*  HCT 23.6*   < > 21.7* 22.4* 18.7* 26.1* 25.7*  PLT 243   < > 150 202 171 167 173  MCV 89.7   < > 90.0 90.3 89.9 88.5 88.6  MCH 28.5   < > 28.6 27.8 28.8 29.5 29.7  MCHC 31.8   < > 31.8 30.8 32.1 33.3 33.5  RDW 14.4   < > 14.5 14.6 14.5 14.6 14.6  LYMPHSABS 0.6*  --   --  1.0 1.1 0.9 1.6  MONOABS  0.8  --   --  1.2* 1.1* 1.1* 1.2*  EOSABS 1.0*  --   --  1.0* 1.2* 1.3* 1.9*  BASOSABS 0.0  --   --  0.0 0.0 0.0 0.0   < > = values in this interval not displayed.    Recent Labs  Lab 09/29/23 0450 09/30/23 0527 10/01/23 0447 10/01/23 1510 10/01/23 1519 10/02/23 0616 10/03/23 0334 10/04/23 0418 10/04/23 1021 10/05/23 0514  NA 139 141 138 138  --  138 139 137  --   --   K 4.7 4.9 4.8 4.8  --  4.9 4.6 4.0  --   --   CL 114* 115* 111 113*  --  111 112* 103  --   --   CO2 13* 13* 11* 12*  --  11* 14* 21*  --   --   ANIONGAP 12 13 16* 13  --  16* 13 13  --   --   GLUCOSE 80 110* 119* 201*  --  122* 105* 194*  --   --   BUN 66* 67* 68* 71*  --  68* 69* 44*  --   --   CREATININE 13.08* 13.41* 13.85* 14.17*  --  13.12* 12.70* 9.15*  --   --   AST 15 16 14*  --   --  18 19 16   --   --   ALT 13 15 14   --   --  10 15 16   --   --   ALKPHOS 98 99 103  --   --  103 96 104  --   --   BILITOT 0.6 0.4 0.7  --   --  0.6 0.6 0.7  --   --   ALBUMIN  2.0* 2.1* 2.1* 1.7*  --   1.8* 1.7* 1.8*  --   --   CRP  --   --   --  4.7*  --  11.7* 10.0* 7.8*  --  8.8*  PROCALCITON  --   --   --   --  0.41 0.74 0.74 0.72  --  0.68  INR  --  1.1  --   --   --   --   --   --   --   --   AMMONIA  --   --   --   --   --   --   --   --  28 19  BNP  --  717.8* 590.8*  --   --  509.8* 490.9*  --   --   --   MG 1.8 1.9 2.0  --   --  1.8 1.9  --   --   --   CALCIUM  6.7* 6.8* 6.4* 6.4*  --  6.8* 6.7* 6.5*  --   --       Recent Labs  Lab 09/29/23 0450 09/30/23 0527 10/01/23 0447 10/01/23 1510 10/01/23 1519 10/02/23 0616 10/03/23 0334 10/04/23 0418 10/04/23 1021 10/05/23 0514  CRP  --   --   --  4.7*  --  11.7* 10.0* 7.8*  --  8.8*  PROCALCITON  --   --   --   --  0.41 0.74 0.74 0.72  --  0.68  INR  --  1.1  --   --   --   --   --   --   --   --   AMMONIA  --   --   --   --   --   --   --   --  28 19  BNP  --  717.8* 590.8*  --   --  509.8* 490.9*  --   --   --   MG 1.8 1.9 2.0  --   --  1.8 1.9  --   --   --   CALCIUM  6.7* 6.8* 6.4* 6.4*  --  6.8* 6.7* 6.5*  --   --     Lab Results  Component Value Date   HGBA1C 6.1 (H) 09/28/2023   No results for input(s): TSH, T4TOTAL, FREET4, T3FREE, THYROIDAB in the last 72 hours. No results for input(s): VITAMINB12, FOLATE, FERRITIN, TIBC, IRON, RETICCTPCT in the last 72 hours.  ------------------------------------------------------------------------------------------------------------------ Cardiac Enzymes No results for input(s): CKMB, TROPONINI, MYOGLOBIN in the last 168 hours.  Invalid input(s): CK  Micro Results No results found for this or any previous visit (from the past 240 hours).  Radiology Reports DG Knee 1-2 Views Right Result Date: 10/05/2023 CLINICAL DATA:  Fall, leg pain, swelling EXAM: RIGHT KNEE - 1-2 VIEW COMPARISON:  None Available. FINDINGS: Slight joint space narrowing. No acute bony abnormality. Specifically, no fracture, subluxation, or dislocation. No joint effusion.  IMPRESSION: No acute bony abnormality. Electronically Signed   By: Franky Crease M.D.   On: 10/05/2023 02:38   DG Knee 1-2 Views Left Result Date: 10/05/2023 CLINICAL DATA:  Fall, leg pain, swelling EXAM: LEFT KNEE - 1-2 VIEW COMPARISON:  None Available. FINDINGS: No acute bony abnormality. Specifically, no fracture, subluxation, or dislocation. Early degenerative changes with early joint space narrowing and spurring. IMPRESSION: No acute bony abnormality. Electronically Signed   By: Franky Crease M.D.   On: 10/05/2023 02:38   DG Pelvis 1-2 Views Result Date: 10/05/2023 CLINICAL DATA:  Fall, leg swelling EXAM: PELVIS - 1-2 VIEW COMPARISON:  None Available. FINDINGS: There is no evidence of pelvic fracture or diastasis. No pelvic bone lesions are seen. Hip joints and SI joints symmetric. Right groin dialysis catheter partially visualized. IMPRESSION: No acute bony abnormality. Electronically Signed   By: Franky Crease M.D.   On: 10/05/2023 02:37   CT HEAD WO CONTRAST ( ) Result Date: 10/05/2023 CLINICAL DATA:  Ataxia, head trauma fall EXAM: CT HEAD WITHOUT CONTRAST TECHNIQUE: Contiguous axial images were obtained from the base of the skull through the vertex without intravenous contrast. RADIATION DOSE REDUCTION: This exam was performed according to the departmental dose-optimization program which includes automated exposure control, adjustment of the mA and/or kV according to patient size and/or use of iterative reconstruction technique. COMPARISON:  CT head January 25, 2021. MRI head January 26, 2021. FINDINGS: Brain: Age indeterminate small perforator infarct in the left basal ganglia, new since January 26 2021. No evidence of acute hemorrhage, mass lesion, midline shift, or hydrocephalus. Vascular: No hyperdense vessel identified. Skull: No acute fracture. Sinuses/Orbits: Clear sinuses.  No acute orbital findings. Other: No mastoid effusions. IMPRESSION: 1. Age indeterminate small perforator infarct in the left  basal ganglia, new since January 26 2021. If there is concern for acute infarct, MRI head could better assess. 2. Remote right basal ganglia perforator infarct. Electronically Signed   By: Gilmore GORMAN Molt M.D.   On: 10/05/2023 02:29   IR Fluoro Guide CV Line Right Result Date: 10/02/2023 INDICATION: 43 year old female with nonfunctional temporary right IJ hemodialysis catheter EXAM: IMAGE GUIDED EXCHANGE OF RIGHT IJ TEMPORARY HEMODIALYSIS CATHETER MEDICATIONS: NONE ANESTHESIA/SEDATION: Moderate (conscious) sedation was NOT employed during this procedure. FLUOROSCOPY: Radiation Exposure Index (as provided by the fluoroscopic device): 1.0 mGy Kerma COMPLICATIONS: None PROCEDURE: Informed written  consent was obtained from the patient after a discussion of the risks, benefits, and alternatives to treatment. Questions regarding the procedure were encouraged and answered. The right neck and chest, including the indwelling catheter were prepped with chlorhexidine  in a sterile fashion, and a sterile drape was applied covering the operative field. Maximum barrier sterile technique with sterile gowns and gloves were used for the procedure. A timeout was performed prior to the initiation of the procedure. Scout image was acquired. Wire was passed through the catheter into the IVC under fluoroscopy. The suture was ligated at the neck. And the 20 cm catheter was exchanged for a 16 cm temporary triple-lumen hemodialysis catheter. Wire was removed and a final image was stored demonstrating that the catheter was present within the upper right atrium. The catheter was flushed with appropriate volume heparin  dwells. 1% lidocaine  was used for local anesthesia. The catheter exit site was secured with a 0-Prolene retention suture. Dressings were applied. The patient tolerated the procedure well without immediate post procedural complication. IMPRESSION: Status post image guided exchange of right IJ temporary hemodialysis catheter.  Signed, Ami RAMAN. Alona ROSALEA GRAVER, RPVI [facility] Radiology Electronically Signed   By: Ami Alona D.O.   On: 10/02/2023 09:51   IR Fluoro Guide CV Line Right Result Date: 10/02/2023 INDICATION: 43 year old female with history of acute kidney injury status post temporary hemodialysis catheter earlier the same day which was not functioning during hemodialysis. The patient presents for catheter check and exchange. EXAM: Temporary CENTRAL VENOUS HEMODIALYSIS CATHETER REPLACEMENT WITH FLUOROSCOPIC GUIDANCE MEDICATIONS: None FLUOROSCOPY TIME:  One mGy COMPLICATIONS: None immediate. PROCEDURE: Informed written consent was obtained from the patient after a discussion of the risks, benefits, and alternatives to treatment. Questions regarding the procedure were encouraged and answered. The skin and external portion of the existing hemodialysis catheter was prepped with chlorhexidine  in a sterile fashion, and a sterile drape was applied covering the operative field. Maximum barrier sterile technique with sterile gowns and gloves were used for the procedure. A timeout was performed prior to the initiation of the procedure. The central lumen of the indwelling hemodialysis catheter aspirated and flushed without difficulty. The red lumen would not aspirate. The blue lumen aspirated and flushed without difficulty. Fluoroscopic evaluation demonstrated the catheter tip in the superior aspect of the right atrium. Local anesthetic was applied to the catheter entry site with 1% lidocaine . The central lumen was cannulated with a Rosen wire was directed under fluoroscopic guidance to the inferior vena cava. The catheter was removed and exchanged for a new, 20 cm Trialysis catheter. The catheter tip was positioned in the inferior aspect of the right atrium. All 3 lumens flushed and aspirated without difficulty. The catheter was flushed with appropriate volume heparin  dwell as. The  catheter was affixed to the skin with an interrupted 0 silk suture. A sterile bandage was applied. The patient tolerated the procedure well without immediate post procedural complication. IMPRESSION: Successful exchange of indwelling non tunneled hemodialysis catheter for 20 cm Trialysis catheter with tips terminating within the right atrium. The catheter is ready for immediate use. Ester Sides, MD [facility] Radiology Electronically Signed   By: Ester Sides M.D.   On: 10/02/2023 07:54   IR Fluoro Guide CV Line Right Result Date: 10/01/2023 CLINICAL DATA:  Acute kidney injury and need for hemodialysis. EXAM: NON-TUNNELED CENTRAL VENOUS HEMODIALYSIS CATHETER PLACEMENT WITH ULTRASOUND AND FLUOROSCOPIC GUIDANCE FLUOROSCOPY: 6 seconds.  3.0 mGy. PROCEDURE: The procedure,  risks, benefits, and alternatives were explained to the patient. Questions regarding the procedure were encouraged and answered. The patient understands and consents to the procedure. A time-out was performed prior to initiating the procedure. The right neck and chest were prepped with chlorhexidine  in a sterile fashion, and a sterile drape was applied covering the operative field. Maximum barrier sterile technique with sterile gowns and gloves were used for the procedure. Local anesthesia was provided with 1% lidocaine . Ultrasound was used to confirm patency of the right internal jugular vein. An ultrasound image was saved recorded. After creating a small venotomy incision, a 21 gauge needle was advanced into the right internal jugular vein under direct, real-time ultrasound guidance. Ultrasound image documentation was performed. After securing guidewire access venous access was dilated over the wire. A 16 cm, 13 French triple-lumen Mahurkar non tunneled hemodialysis catheter was advanced over the wire. Final catheter positioning was confirmed and documented with a fluoroscopic spot image. The  catheter was aspirated, flushed with saline, and injected with appropriate volume heparin  dwells. The catheter exit site was secured with 0-Prolene retention sutures. COMPLICATIONS: None.  No pneumothorax. FINDINGS: After catheter placement, the tip lies in the right atrium. The catheter aspirates normally and is ready for immediate use. IMPRESSION: Placement of non-tunneled central venous hemodialysis catheter via the right internal jugular vein. The catheter tip lies in the right atrium. The catheter is ready for immediate use. Electronically Signed   By: Marcey Moan M.D.   On: 10/01/2023 12:44   IR US  Guide Vasc Access Right Result Date: 10/01/2023 CLINICAL DATA:  Acute kidney injury and need for hemodialysis. EXAM: NON-TUNNELED CENTRAL VENOUS HEMODIALYSIS CATHETER PLACEMENT WITH ULTRASOUND AND FLUOROSCOPIC GUIDANCE FLUOROSCOPY: 6 seconds.  3.0 mGy. PROCEDURE: The procedure, risks, benefits, and alternatives were explained to the patient. Questions regarding the procedure were encouraged and answered. The patient understands and consents to the procedure. A time-out was performed prior to initiating the procedure. The right neck and chest were prepped with chlorhexidine  in a sterile fashion, and a sterile drape was applied covering the operative field. Maximum barrier sterile technique with sterile gowns and gloves were used for the procedure. Local anesthesia was provided with 1% lidocaine . Ultrasound was used to confirm patency of the right internal jugular vein. An ultrasound image was saved recorded. After creating a small venotomy incision, a 21 gauge needle was advanced into the right internal jugular vein under direct, real-time ultrasound guidance. Ultrasound image documentation was performed. After securing guidewire access venous access was dilated over the wire. A 16 cm, 13 French triple-lumen Mahurkar non tunneled hemodialysis catheter was advanced over the wire. Final catheter positioning was  confirmed and documented with a fluoroscopic spot image. The catheter was aspirated, flushed with saline, and injected with appropriate volume heparin  dwells. The catheter exit site was secured with 0-Prolene retention sutures. COMPLICATIONS: None.  No pneumothorax. FINDINGS: After catheter placement, the tip lies in the right atrium. The catheter aspirates normally and is ready for immediate use. IMPRESSION: Placement of non-tunneled central venous hemodialysis catheter via the right internal jugular vein. The catheter tip lies in the right atrium. The catheter is ready for immediate use. Electronically Signed   By: Marcey Moan M.D.   On: 10/01/2023 12:44      Signature  -   Lavada Stank M.D on 10/05/2023 at 9:12 AM   -  To page go to [email or web address]

## 2023-10-05 NOTE — Progress Notes (Signed)
   10/05/23 0300  What Happened  Was fall witnessed? No  Was patient injured? No  Patient found on floor  Found by No one-pt stated they fell  Stated prior activity to/from bed, chair, or stretcher  Provider Notification  Provider Name/Title Redia LOISE Franky ROLLA  Date Provider Notified 10/05/23  Time Provider Notified 0132  Method of Notification Page  Notification Reason Fall  Provider response See new orders  Date of Provider Response 10/05/23  Time of Provider Response 0134  Follow Up  Family notified  (family at bedside)  Additional tests Yes-comment  Simple treatment Other (comment) (CT head, DG B/L knee, pelvis)  Adult Fall Risk Assessment  Risk Factor Category (scoring not indicated) Not Applicable  Age 43  Fall History: Fall within 6 months prior to admission 5  Elimination; Bowel and/or Urine Incontinence 0  Elimination; Bowel and/or Urine Urgency/Frequency 0  Medications: includes PCA/Opiates, Anti-convulsants, Anti-hypertensives, Diuretics, Hypnotics, Laxatives, Sedatives, and Psychotropics 3  Patient Care Equipment 1  Mobility-Assistance 0  Mobility-Gait 0  Mobility-Sensory Deficit 0  Altered awareness of immediate physical environment 0  Impulsiveness 0  Lack of understanding of one's physical/cognitive limitations 0  Total Score 9  Adult Fall Risk Interventions  Required Bundle Interventions *See Row Information* Moderate fall risk - low and moderate requirements implemented  Additional Interventions Use of appropriate toileting equipment (bedpan, BSC, etc.)  Fall intervention(s) refused/Patient educated regarding refusal Nonskid socks  Screening for Fall Injury Risk (To be completed on HIGH fall risk patients) - Assessing Need for Floor Mats  Risk For Fall Injury- Criteria for Floor Mats None identified - No additional interventions needed  Vitals  Temp 98.1 F (36.7 C)  Temp Source Oral  BP (!) 177/88  MAP (mmHg) 112  BP Location Left Arm  BP Method  Automatic  Patient Position (if appropriate) Lying  Pulse Rate 87  Pulse Rate Source Monitor  ECG Heart Rate 86  Cardiac Rhythm NSR  Resp 16  Oxygen Therapy  SpO2 100 %  O2 Device Room Air  Pain Assessment  Pain Scale 0-10  Pain Score 0  Neurological  Neuro (WDL) WDL  Level of Consciousness Alert  Orientation Level Oriented X4  Cognition Follows commands  Speech Clear  Motor Function/Sensation Assessment Grip  R Hand Grip Strong  L Hand Grip Strong  RUE Motor Response Purposeful movement  RUE Motor Strength 5  LUE Motor Response Purposeful movement  LUE Motor Strength 5  RLE Motor Response Purposeful movement  RLE Motor Strength 5  LLE Motor Response Purposeful movement  LLE Motor Strength 5  Neuro Symptoms None  Glasgow Coma Scale  Eye Opening 4  Best Verbal Response (NON-intubated) 5  Best Motor Response 6  Musculoskeletal  Musculoskeletal (WDL) WDL  Assistive Device None  Generalized Weakness Yes  Weight Bearing Restrictions Per Provider Order No  Musculoskeletal Details  RLE Weakness  LLE Weakness  Integumentary  Integumentary (WDL) X  Skin Color Appropriate for ethnicity  Skin Condition Itching;Dry  Itching intervention Benadryl   Rash Location Other (Comment) (generalized)  Rash Location Orientation Right;Left  Scratch Marks Location Other (Comment) (Generalized)  Scratch Marks Intervention Cleansed;Barrier cream  Skin Turgor Non-tenting

## 2023-10-05 NOTE — Plan of Care (Signed)
  Problem: Coping: Goal: Ability to adjust to condition or change in health will improve Outcome: Progressing   Problem: Education: Goal: Knowledge of General Education information will improve Description: Including pain rating scale, medication(s)/side effects and non-pharmacologic comfort measures Outcome: Progressing   Problem: Pain Management: Goal: General experience of comfort will improve Outcome: Progressing   Problem: Safety: Goal: Ability to remain free from injury will improve Outcome: Progressing   Problem: Skin Integrity: Goal: Risk for impaired skin integrity will decrease Outcome: Progressing

## 2023-10-05 NOTE — Progress Notes (Signed)
 Pt has been accepted at TCU at [facility] with a 7:30 am chair time. Attempted to meet with pt at bedside but pt was asleep. Will attempt to meet with pt again tomorrow to discuss out-pt HD arrangements. Update provided to nephrologist. Will assist as needed.   Randine Mungo Renal Navigator [phone]

## 2023-10-05 NOTE — Progress Notes (Signed)
 Patient sustained fall @ 0115, patient appeared to have fell on knee and was found in the sitting position beside the bed. No skin injury noted. Patient escorted back on bed, no change on mentation noted. Post fall vitals temp 98.35F, bp 177/88 (112) HR 87/m RR 16/m. Patient currently resting on the bed without any signs of distress. Charge RN notified and at bedside, Provider on call Dr Redia LOISE cleaver notified and stat CT Head, DG B/L knee and pelvis was placed.

## 2023-10-05 NOTE — Progress Notes (Signed)
 Initial Nutrition Assessment  DOCUMENTATION CODES:   Obesity unspecified  INTERVENTION:  Continue renal diet Continue Glucerna Shake po TID, each supplement provides 220 kcal and 10 grams of protein Initiate Renal vitamin Provided education on nutrition and dialysis Provide video at discharge Discontinue Boost Breeze po TID, each supplement provides 250 kcal and 9 grams of protein    NUTRITION DIAGNOSIS:   Increased nutrient needs related to chronic illness as evidenced by estimated needs.    GOAL:   Patient will meet greater than or equal to 90% of their needs    MONITOR:   PO intake, Supplement acceptance  REASON FOR ASSESSMENT:   Consult Assessment of nutrition requirement/status, Diet education  ASSESSMENT:   43 y.o. F, presented from home to ED with complaints worsening bilateral lower extremity edema. Admitted with acute renal failure. 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  Patient setting up on edge of bed with family in room.  She stated that appetite is good, her UBW is around 250# and that was around 2 months ago. She has had DM for a while with mother and grandmother also having it.  States that she is not strict with her diet. No appetite changes reported.  She states she eats between 75-100% of most of her meals. Patient receiving Boost Breeze po BID, each supplement provides 250 kcal and 9 grams of protein, and Glucerna Shake po TID, each supplement provides 220 kcal and 10 grams of protein.  Suspect excessive supplement intake will d/c boost.  Patient will continue to meet  [facility] with good oral intake and Glucerna.   Provided patient with nutrition on dialysis education verbally and with handout.   Weight history: 10/05/23 102.8 kg  04/15/23 105.2 kg  03/10/23 108.9 kg  11/13/22 110.2 kg   Hospital weight history: 10/05/23 0457 102.8 kg 226.63 lbs  10/04/23 0415 105.4 kg 232.37 lbs  10/03/23 0731 102.2 kg 225.31 lbs  10/01/23 1646 102.7 kg 226.41 lbs  10/01/23 1506 102.1 kg 225.09 lbs  10/01/23 0500 102.9 kg 226.85 lbs  09/30/23 0300 107.2 kg 236.33 lbs  09/29/23 0454 105.9 kg 233.47 lbs      Average Meal Intake: 75-100% average self reported  Nutritionally Relevant Medications: Scheduled Meds:  carvedilol   6.25 mg Oral BID WC   darbepoetin (ARANESP ) injection - NON-DIALYSIS  60 mcg Subcutaneous Q Fri-1800   docusate sodium   200 mg Oral BID   feeding supplement  1 Container Oral BID BM   feeding supplement (GLUCERNA SHAKE)  237 mL Oral TID BM   hydrALAZINE   100 mg Oral Q8H    Labs Reviewed    NUTRITION - FOCUSED PHYSICAL EXAM:  Flowsheet Row Most Recent Value  Orbital Region No depletion  Upper Arm Region Mild depletion  Thoracic and Lumbar Region No depletion  Buccal Region No depletion  Temple Region Mild depletion  Clavicle Bone Region No depletion  Clavicle and Acromion Bone Region No depletion  Scapular Bone Region No depletion  Dorsal Hand No depletion  Patellar Region No depletion  Anterior Thigh Region No depletion  Posterior Calf Region No depletion  Edema (RD Assessment) Mild  Hair Reviewed  Eyes Reviewed  Mouth  Reviewed  Skin Reviewed  Nails Reviewed       Diet Order:   Diet Order             Diet renal with fluid restriction Fluid restriction: 1200 mL Fluid; Room service appropriate? Yes; Fluid consistency: Thin  Diet effective now                   EDUCATION NEEDS:   Education needs have been addressed  Skin:  Skin Assessment: Reviewed RN Assessment  Last BM:  10/05/23  Height:   Ht Readings  from Last 1 Encounters:  04/15/23 5' 5 (1.651 m)    Weight:   Wt Readings from Last 1 Encounters:  10/05/23 102.8 kg    Ideal Body Weight:     BMI:  Body mass index is 37.71 kg/m.  Estimated Nutritional Needs:   Kcal:  1700-2000 kcal  Protein:  75-85 g  Fluid:  21ml/kcal    Jenna Pew RDN, LDN Clinical Dietitian   If unable to reach, please contact RD Inpatient secure chat group between 8 am-4 pm daily

## 2023-10-05 NOTE — Progress Notes (Signed)
 [facility] Progress Note   Assessment/ Plan:    AKI on CKD with progression to ESRD It appears that she has had several episodes of AKI/CKD with ARB and SGLT-2 inhibitors in the past, but her Scr really started to climb when her diuretics were increased in March 2024. She has evidence of volume overload with anasarca with pleural effusions and small amount of ascites.  She is having mild uremic symptoms with anorexia, dysgeusia, and refractory edema. Ordered renal panel for today and daily  VVS placed R [facility] 10/02/23.  HD #1 finally completed 10/03/23.   Plan for HD on 1/7 - treatment was moved due to patient volumes  Will need perm access but interested in home and would prefer PD.  Defer AVF She was CLIP'd to the TCU.  Treatments will be per Mon/Tues/Thurs/Fri schedule outpatient  Plan for Lasix  on non-HD days   Reordered strict ins/outs Anasarca - has history of chronic diastolic CHF.  Pericardial effusion as below. Lasix  80 mg IV once today and would plan on lasix  on non-HD days  Pericardial effusion - Last TTE on 12/30 noted pericardial effusion without tamponade.  Note team has ordered repeat TTE Hypocalcemia - repleted with IV calcium .  She was started on calcitriol  0.25 mcg daily in September.  Her iPTH was 327 at that time.  Update intact PTH.  Resume calcitriol  at 0.25 mcg daily for now (will be given outpatient at HD) Anemia of CKD stage IV-V - Hgb was 10.4 on 06/09/23. 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  H/o Right RCC s/p partial right nephrectomy 2020 - no evidence of recurrence or mets on CT of abd/pelvis per charting. HTN/volume- adding lasix  today and on non-HD days (Wednesday, Sat, Sun) Metabolic acidosis - stop bicarbonate as she is on HD  Disposition - per primary team.  She may be discharged tomorrow vs Friday per primary team.      Subjective:    Last HD on 1/3 with 0.5 kg UF.  She was CLIP'd to the TCU.  There is no urine output recorded over 1/5 but had some previous days.  note CT head with age indeterminate small perforator infarct in the left basal ganglia (new since January 26 2021)  - per team this is not acute concern.  She would prefer PD (vs home hemo) and would prefer dialysis at home.    Review of systems:  Denies shortness of breath or chest pain Denies n/v   Objective:   BP (!) 164/73 (BP Location: Left Arm)   Pulse 85   Temp 98.4 F (36.9 C) (Oral)   Resp 18   Wt 102.8 kg   LMP 09/25/2023   SpO2 100%   BMI 37.71 kg/m  No intake or output data in the 24 hours ending 10/05/23 1615  Weight change: 0.6 kg  Physical Exam:  General adult female in bed in no acute distress HEENT normocephalic atraumatic extraocular movements intact sclera anicteric Neck supple trachea midline Lungs clear to auscultation bilaterally normal work of breathing at rest  Heart S1S2 no rub Abdomen soft nontender nondistended Extremities 2+ edema  Psych normal mood and affect Neuro alert and oriented x 3 provides hx and follows commands Access: right femoral tunn catheter  Imaging: DG Knee 1-2 Views Right Result Date: 10/05/2023 CLINICAL DATA:  Fall, leg pain, swelling EXAM: RIGHT  KNEE - 1-2 VIEW COMPARISON:  None Available. FINDINGS: Slight joint space narrowing. No acute bony abnormality. Specifically, no fracture, subluxation, or dislocation. No joint effusion. IMPRESSION: No acute bony abnormality. Electronically Signed   By: Franky Crease M.D.   On: 10/05/2023 02:38   DG Knee 1-2 Views Left Result Date: 10/05/2023 CLINICAL DATA:  Fall, leg pain, swelling EXAM: LEFT KNEE - 1-2 VIEW COMPARISON:  None Available. FINDINGS: No acute bony abnormality. Specifically, no fracture, subluxation, or dislocation. Early degenerative changes with early joint space narrowing and spurring. IMPRESSION: No acute bony abnormality.  Electronically Signed   By: Franky Crease M.D.   On: 10/05/2023 02:38   DG Pelvis 1-2 Views Result Date: 10/05/2023 CLINICAL DATA:  Fall, leg swelling EXAM: PELVIS - 1-2 VIEW COMPARISON:  None Available. FINDINGS: There is no evidence of pelvic fracture or diastasis. No pelvic bone lesions are seen. Hip joints and SI joints symmetric. Right groin dialysis catheter partially visualized. IMPRESSION: No acute bony abnormality. Electronically Signed   By: Franky Crease M.D.   On: 10/05/2023 02:37   CT HEAD WO CONTRAST ( ) Result Date: 10/05/2023 CLINICAL DATA:  Ataxia, head trauma fall EXAM: CT HEAD WITHOUT CONTRAST TECHNIQUE: Contiguous axial images were obtained from the base of the skull through the vertex without intravenous contrast. RADIATION DOSE REDUCTION: This exam was performed according to the departmental dose-optimization program which includes automated exposure control, adjustment of the mA and/or kV according to patient size and/or use of iterative reconstruction technique. COMPARISON:  CT head January 25, 2021. MRI head January 26, 2021. FINDINGS: Brain: Age indeterminate small perforator infarct in the left basal ganglia, new since January 26 2021. No evidence of acute hemorrhage, mass lesion, midline shift, or hydrocephalus. Vascular: No hyperdense vessel identified. Skull: No acute fracture. Sinuses/Orbits: Clear sinuses.  No acute orbital findings. Other: No mastoid effusions. IMPRESSION: 1. Age indeterminate small perforator infarct in the left basal ganglia, new since January 26 2021. If there is concern for acute infarct, MRI head could better assess. 2. Remote right basal ganglia perforator infarct. Electronically Signed   By: Gilmore GORMAN Molt M.D.   On: 10/05/2023 02:29     Labs: BMET Recent Labs  Lab 09/29/23 0450 09/30/23 0527 10/01/23 0447 10/01/23 1510 10/02/23 0616 10/03/23 0334 10/04/23 0418  NA 139 141 138 138 138 139 137  K 4.7 4.9 4.8 4.8 4.9 4.6 4.0  CL 114* 115* 111  113* 111 112* 103  CO2 13* 13* 11* 12* 11* 14* 21*  GLUCOSE 80 110* 119* 201* 122* 105* 194*  BUN 66* 67* 68* 71* 68* 69* 44*  CREATININE 13.08* 13.41* 13.85* 14.17* 13.12* 12.70* 9.15*  CALCIUM  6.7* 6.8* 6.4* 6.4* 6.8* 6.7* 6.5*  PHOS 9.2* 9.4* 9.0* 9.1* 8.6* 8.2*  --    CBC Recent Labs  Lab 10/02/23 0616 10/03/23 0334 10/04/23 0418 10/05/23 0514  WBC 16.6* 14.3* 15.6* 14.9*  NEUTROABS 13.2* 10.7* 11.9* 9.8*  HGB 6.9* 6.0* 8.7* 8.6*  HCT 22.4* 18.7* 26.1* 25.7*  MCV 90.3 89.9 88.5 88.6  PLT 202 171 167 173    Medications:     apixaban   5 mg Oral BID   carvedilol   6.25 mg Oral BID WC   Chlorhexidine  Gluconate Cloth  6 each Topical Q0600   darbepoetin (ARANESP ) injection - NON-DIALYSIS  60 mcg Subcutaneous Q Fri-1800   docusate sodium   200 mg Oral BID   feeding supplement (GLUCERNA SHAKE)  237 mL Oral TID BM   hydrALAZINE   100  mg Oral Q8H   hydrocerin   Topical BID   insulin  aspart  0-6 Units Subcutaneous TID WC   isosorbide  mononitrate  30 mg Oral Daily   multivitamin  1 tablet Oral QHS   pantoprazole   40 mg Oral Daily   rosuvastatin   5 mg Oral Daily    Sherri JAYSON Saba, MD 10/05/2023, 4:42 PM

## 2023-10-06 ENCOUNTER — Inpatient Hospital Stay (HOSPITAL_COMMUNITY): Payer: 59

## 2023-10-06 ENCOUNTER — Other Ambulatory Visit (HOSPITAL_COMMUNITY): Payer: Self-pay

## 2023-10-06 ENCOUNTER — Other Ambulatory Visit (HOSPITAL_COMMUNITY): Payer: 59

## 2023-10-06 DIAGNOSIS — I3139 Other pericardial effusion (noninflammatory): Secondary | ICD-10-CM

## 2023-10-06 DIAGNOSIS — N179 Acute kidney failure, unspecified: Secondary | ICD-10-CM | POA: Diagnosis not present

## 2023-10-06 LAB — AMMONIA: Ammonia: 15 umol/L (ref 9–35)

## 2023-10-06 LAB — URINALYSIS, ROUTINE W REFLEX MICROSCOPIC
Bilirubin Urine: NEGATIVE
Glucose, UA: NEGATIVE mg/dL
Ketones, ur: NEGATIVE mg/dL
Nitrite: NEGATIVE
Protein, ur: 100 mg/dL — AB
Specific Gravity, Urine: 1.013 (ref 1.005–1.030)
WBC, UA: >50 WBC/hpf (ref 0–5)
pH: 5 (ref 5.0–8.0)

## 2023-10-06 LAB — RENAL FUNCTION PANEL
Albumin: 1.7 g/dL — ABNORMAL LOW (ref 3.5–5.0)
Anion gap: 15 (ref 5–15)
BUN: 57 mg/dL — ABNORMAL HIGH (ref 6–20)
CO2: 18 mmol/L — ABNORMAL LOW (ref 22–32)
Calcium: 6.7 mg/dL — ABNORMAL LOW (ref 8.9–10.3)
Chloride: 104 mmol/L (ref 98–111)
Creatinine, Ser: 9.64 mg/dL — ABNORMAL HIGH (ref 0.44–1.00)
GFR, Estimated: 5 mL/min — ABNORMAL LOW (ref >60)
Glucose, Bld: 84 mg/dL (ref 70–99)
Phosphorus: 5.2 mg/dL — ABNORMAL HIGH (ref 2.5–4.6)
Potassium: 4.8 mmol/L (ref 3.5–5.1)
Sodium: 137 mmol/L (ref 135–145)

## 2023-10-06 LAB — CBC WITH DIFFERENTIAL/PLATELET
Abs Immature Granulocytes: 0 10*3/uL (ref 0.00–0.07)
Basophils Absolute: 0 10*3/uL (ref 0.0–0.1)
Basophils Relative: 0 %
Eosinophils Absolute: 1.5 10*3/uL — ABNORMAL HIGH (ref 0.0–0.5)
Eosinophils Relative: 12 %
HCT: 27.9 % — ABNORMAL LOW (ref 36.0–46.0)
Hemoglobin: 8.9 g/dL — ABNORMAL LOW (ref 12.0–15.0)
Lymphocytes Relative: 9 %
Lymphs Abs: 1.2 10*3/uL (ref 0.7–4.0)
MCH: 29 pg (ref 26.0–34.0)
MCHC: 31.9 g/dL (ref 30.0–36.0)
MCV: 90.9 fL (ref 80.0–100.0)
Monocytes Absolute: 0.4 10*3/uL (ref 0.1–1.0)
Monocytes Relative: 3 %
Neutro Abs: 9.7 10*3/uL — ABNORMAL HIGH (ref 1.7–7.7)
Neutrophils Relative %: 76 %
Platelets: 177 10*3/uL (ref 150–400)
RBC: 3.07 MIL/uL — ABNORMAL LOW (ref 3.87–5.11)
RDW: 14.6 % (ref 11.5–15.5)
WBC: 12.8 10*3/uL — ABNORMAL HIGH (ref 4.0–10.5)
nRBC: 0 /100{WBCs}
nRBC: 0.4 % — ABNORMAL HIGH (ref 0.0–0.2)

## 2023-10-06 LAB — ECHOCARDIOGRAM LIMITED
S' Lateral: 2.3 cm
Weight: 3559.11 [oz_av]

## 2023-10-06 LAB — PROCALCITONIN: Procalcitonin: 0.47 ng/mL

## 2023-10-06 LAB — GLUCOSE, CAPILLARY
Glucose-Capillary: 84 mg/dL (ref 70–99)
Glucose-Capillary: 99 mg/dL (ref 70–99)

## 2023-10-06 LAB — PATHOLOGIST SMEAR REVIEW

## 2023-10-06 MED ORDER — ALBUMIN HUMAN 25 % IV SOLN
25.0000 g | Freq: Once | INTRAVENOUS | Status: AC
  Administered 2023-10-06: 25 g via INTRAVENOUS
  Filled 2023-10-06: qty 100

## 2023-10-06 MED ORDER — ACCU-CHEK SOFTCLIX LANCETS MISC
1.0000 | Freq: Three times a day (TID) | 0 refills | Status: AC
  Filled 2023-10-06: qty 100, 30d supply, fill #0

## 2023-10-06 MED ORDER — ANTICOAGULANT SODIUM CITRATE 4% (200MG/5ML) IV SOLN: 5.0000 mL | Status: DC | PRN

## 2023-10-06 MED ORDER — CARVEDILOL 12.5 MG PO TABS
12.5000 mg | ORAL_TABLET | Freq: Two times a day (BID) | ORAL | Status: DC
  Administered 2023-10-06: 12.5 mg via ORAL
  Filled 2023-10-06 (×2): qty 1

## 2023-10-06 MED ORDER — BLOOD GLUCOSE MONITOR SYSTEM W/DEVICE KIT
1.0000 | PACK | Freq: Three times a day (TID) | 0 refills | Status: AC
  Filled 2023-10-06: qty 1, 30d supply, fill #0

## 2023-10-06 MED ORDER — LIDOCAINE-PRILOCAINE 2.5-2.5 % EX CREA: 1.0000 | TOPICAL_CREAM | CUTANEOUS | Status: DC | PRN

## 2023-10-06 MED ORDER — LANCET DEVICE MISC
1.0000 | Freq: Three times a day (TID) | 0 refills | Status: AC
  Filled 2023-10-06: qty 1, 30d supply, fill #0

## 2023-10-06 MED ORDER — CEPHALEXIN 500 MG PO CAPS: 500.0000 mg | ORAL_CAPSULE | Freq: Two times a day (BID) | ORAL | Status: DC

## 2023-10-06 MED ORDER — CEPHALEXIN 500 MG PO CAPS
500.0000 mg | ORAL_CAPSULE | Freq: Every day | ORAL | Status: DC
  Administered 2023-10-06: 500 mg via ORAL
  Filled 2023-10-06 (×2): qty 1

## 2023-10-06 MED ORDER — ALTEPLASE 2 MG IJ SOLR: 2.0000 mg | Freq: Once | INTRAMUSCULAR | Status: DC | PRN

## 2023-10-06 MED ORDER — HYDRALAZINE HCL 100 MG PO TABS
100.0000 mg | ORAL_TABLET | Freq: Three times a day (TID) | ORAL | 0 refills | Status: DC
  Filled 2023-10-06: qty 90, 30d supply, fill #0

## 2023-10-06 MED ORDER — LIDOCAINE HCL (PF) 1 % IJ SOLN: 5.0000 mL | INTRAMUSCULAR | Status: DC | PRN

## 2023-10-06 MED ORDER — HEPARIN SODIUM (PORCINE) 1000 UNIT/ML DIALYSIS
1000.0000 [IU] | INTRAMUSCULAR | Status: DC | PRN
  Administered 2023-10-06: 2000 [IU]
  Filled 2023-10-06: qty 1

## 2023-10-06 MED ORDER — INSULIN ASPART 100 UNIT/ML FLEXPEN
PEN_INJECTOR | SUBCUTANEOUS | 0 refills | Status: AC
  Filled 2023-10-06: qty 3, 10d supply, fill #0

## 2023-10-06 MED ORDER — PENTAFLUOROPROP-TETRAFLUOROETH EX AERO: 1.0000 | INHALATION_SPRAY | CUTANEOUS | Status: DC | PRN

## 2023-10-06 MED ORDER — BLOOD GLUCOSE TEST VI STRP
1.0000 | ORAL_STRIP | Freq: Three times a day (TID) | 0 refills | Status: AC
  Filled 2023-10-06: qty 100, 30d supply, fill #0

## 2023-10-06 MED ORDER — CEPHALEXIN 500 MG PO CAPS
500.0000 mg | ORAL_CAPSULE | Freq: Every day | ORAL | 0 refills | Status: DC
  Filled 2023-10-06: qty 4, 4d supply, fill #0

## 2023-10-06 MED ORDER — INSULIN PEN NEEDLE 32G X 4 MM MISC
0 refills | Status: AC
  Filled 2023-10-06: qty 100, 25d supply, fill #0

## 2023-10-06 MED ORDER — FUROSEMIDE 80 MG PO TABS
80.0000 mg | ORAL_TABLET | Freq: Every day | ORAL | 0 refills | Status: DC
  Filled 2023-10-06: qty 12, 28d supply, fill #0

## 2023-10-06 MED ORDER — CARVEDILOL 12.5 MG PO TABS
12.5000 mg | ORAL_TABLET | Freq: Two times a day (BID) | ORAL | 0 refills | Status: DC
  Filled 2023-10-06: qty 60, 30d supply, fill #0

## 2023-10-06 MED ORDER — APIXABAN 5 MG PO TABS
5.0000 mg | ORAL_TABLET | Freq: Two times a day (BID) | ORAL | 0 refills | Status: DC
  Filled 2023-10-06: qty 60, 30d supply, fill #0

## 2023-10-06 NOTE — Progress Notes (Signed)
 Met with pt at bedside while receiving HD this am. Discussed out-pt HD arrangements. Pt has been accepted at [facility] at [facility] on Mon, Tues, Thurs, Fri with 7:30 am chair time. Pt can start on Thursday and will need to arrive at 6:45 am to complete paperwork prior to treatment. Pt agreeable to plan and schedule letter provided to pt. Arrangements added to AVS as well. Contacted renal PA regarding clinic's need for orders. Update provided to nephrologist and RN CM. TCU RN aware pt has order to d/c today and should start on Thursday.   Randine Mungo Renal Navigator [phone]

## 2023-10-06 NOTE — Discharge Planning (Signed)
  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 obesity Hx DVT/PE - on Eliquis   Discharge Labs: Basic Metabolic Panel: Recent Labs  Lab 10/02/23 0616 10/03/23 0334 10/04/23 0418 10/06/23 0446  NA 138 139 137 137  K 4.9 4.6 4.0 4.8  CL 111 112* 103 104  CO2 11* 14* 21* 18*  GLUCOSE 122* 105* 194* 84  BUN 68* 69* 44* 57*  CREATININE 13.12* 12.70* 9.15* 9.64*  CALCIUM  6.8* 6.7* 6.5* 6.7*  PHOS 8.6* 8.2*  --  5.2*   CBC: Recent Labs  Lab 10/02/23 0616 10/03/23 0334 10/04/23 0418 10/05/23 0514 10/06/23 0446  WBC 16.6* 14.3* 15.6* 14.9* 12.8*  NEUTROABS 13.2* 10.7* 11.9* 9.8* 9.7*  HGB 6.9* 6.0* 8.7* 8.6* 8.9*  HCT 22.4* 18.7* 26.1* 25.7* 27.9*  MCV 90.3 89.9 88.5 88.6 90.9  PLT 202 171 167 173 177    Dialysis Orders: 4d/week 3hr 180 dialyzer 3K/2.5C bath EDW 100kg BFR 400 DFR A1.5 No UR of Na profile TDC (R femoral, placed by VVS 10/02/23) No heparin  to start (current pericardial effusion, but no contraindication if needs in future)  Medications: - Calcitriol  0.5mcg PO q HD - Mircera 50mcg IV q 2 weeks (start 10/08/22, last Aranesp  given 10/01/22) - Venofer per protocol (s/p course of  venofer inpatient)  Other/Appts/Lab orders: - Interested in PD -> pls arrange HT referral and home visit ASAP - Monthly labs on admit, then per clinic protocol, weekly K   Lamarr JONELLE Boehringer, PA-C 10/06/2023, 1:20 PM  [employer] [phone]

## 2023-10-06 NOTE — Plan of Care (Signed)
   Problem: Coping: Goal: Ability to adjust to condition or change in health will improve Outcome: Progressing   Problem: Skin Integrity: Goal: Risk for impaired skin integrity will decrease Outcome: Progressing   Problem: Education: Goal: Knowledge of General Education information will improve Description: Including pain rating scale, medication(s)/side effects and non-pharmacologic comfort measures Outcome: Progressing   Problem: Clinical Measurements: Goal: Will remain free from infection Outcome: Progressing   Problem: Safety: Goal: Ability to remain free from injury will improve Outcome: Progressing

## 2023-10-06 NOTE — Progress Notes (Signed)
 Discharge paperwork reviewed with pt. Pt educated on importance of following up with cardiology for repeat echo in one week. Pt verbalized understanding. Pt alert and oriented x4 in no acute distress upon discharge. Pt has taken all belongings. Pt taken to main entrance via wheelchair by NT.

## 2023-10-06 NOTE — Progress Notes (Signed)
 [facility] Progress Note   Assessment/ Plan:    AKI on CKD with progression to ESRD It appears that she has had several episodes of AKI/CKD with ARB and SGLT-2 inhibitors in the past, but her Scr really started to climb when her diuretics were increased in March 2024. She has evidence of volume overload with anasarca with pleural effusions and small amount of ascites.  She is having mild uremic symptoms with anorexia, dysgeusia, and refractory edema.  VVS placed R [facility] 10/02/23.  HD #1 finally completed 10/03/23.   HD today (treatment was moved from 1/6 due to patient volumes).  Next Treatment Thursday 1/9 outpatient Will need perm access but interested in home and would prefer PD.  Defer AVF.  I have discussed not to shower or immerse the dialysis catheter in water .   She was CLIP'd to the TCU.  Treatments will be per Mon/Tues/Thurs/Fri schedule outpatient  Would order lasix  on non-HD days   Reordered strict ins/outs Anasarca - has history of chronic diastolic CHF.  Pericardial effusion as below.  Would give lasix  80 mg daily on on non-HD days (would be Wed, Sat, and Sunday)  Pericardial effusion - Last TTE on 12/30 noted pericardial effusion without tamponade.  Note team has ordered repeat TTE Hypocalcemia - repleted with IV calcium .  She was started on calcitriol  0.25 mcg daily in September.  Her iPTH was 327 at that time.  Update intact PTH.  Resume calcitriol  at 0.25 mcg daily for now while here (will be given outpatient at HD) Anemia of CKD stage IV-V - Hgb was 10.4 on 06/09/23. 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  H/o Right RCC s/p partial right nephrectomy 2020 - no evidence of recurrence or mets on CT of abd/pelvis per charting. HTN/volume- optimize volume with HD  Metabolic acidosis - stop bicarbonate as she is on HD  Disposition - per primary team.  Team  anticipates discharge today after HD     Subjective:    She had 500 mL UOP over 1/6.  Would prefer dialysis at home.  We have discussed not to shower or immerse the femoral catheter in water .  Seen and examined on dialysis.  Blood pressure 157/78 and HR 95.  Tolerating goal.  Right femoral tunneled dialysis catheter in use.  Procedure supervised.   Review of systems:   Denies shortness of breath or chest pain Reports some nausea with HD    Objective:   BP (!) 173/80 (BP Location: Left Arm)   Pulse 96   Temp 98 F (36.7 C) (Oral) Comment: pre treatment  Resp 14   Wt 103.2 kg Comment: STANDING  LMP 09/25/2023   SpO2 100%   BMI 37.86 kg/m   Intake/Output Summary (Last 24 hours) at 10/06/2023 1008 Last data filed at 10/06/2023 0510 Gross per 24 hour  Intake --  Output 500 ml  Net -500 ml    Weight change: -11.1 kg  Physical Exam:   General adult female in bed in no acute distress HEENT normocephalic atraumatic extraocular movements intact sclera anicteric Neck supple trachea midline Lungs clear to auscultation bilaterally normal work of breathing at rest  Heart S1S2 no rub Abdomen soft nontender nondistended Extremities 2+ edema  Psych normal mood and affect Neuro alert and oriented x 3 provides hx and follows commands Access: right femoral tunn catheter  Imaging: DG Knee 1-2 Views Right Result Date: 10/05/2023 CLINICAL DATA:  Fall, leg pain, swelling EXAM: RIGHT KNEE - 1-2 VIEW COMPARISON:  None Available. FINDINGS: Slight joint space narrowing. No acute bony abnormality. Specifically, no fracture, subluxation, or dislocation. No joint effusion. IMPRESSION: No acute bony abnormality. Electronically Signed   By: Franky Crease M.D.   On: 10/05/2023 02:38   DG Knee 1-2 Views Left Result Date: 10/05/2023 CLINICAL DATA:  Fall, leg pain, swelling EXAM: LEFT KNEE - 1-2 VIEW COMPARISON:  None Available. FINDINGS: No acute bony abnormality. Specifically, no fracture, subluxation, or  dislocation. Early degenerative changes with early joint space narrowing and spurring. IMPRESSION: No acute bony abnormality. Electronically Signed   By: Franky Crease M.D.   On: 10/05/2023 02:38   DG Pelvis 1-2 Views Result Date: 10/05/2023 CLINICAL DATA:  Fall, leg swelling EXAM: PELVIS - 1-2 VIEW COMPARISON:  None Available. FINDINGS: There is no evidence of pelvic fracture or diastasis. No pelvic bone lesions are seen. Hip joints and SI joints symmetric. Right groin dialysis catheter partially visualized. IMPRESSION: No acute bony abnormality. Electronically Signed   By: Franky Crease M.D.   On: 10/05/2023 02:37   CT HEAD WO CONTRAST ( ) Result Date: 10/05/2023 CLINICAL DATA:  Ataxia, head trauma fall EXAM: CT HEAD WITHOUT CONTRAST TECHNIQUE: Contiguous axial images were obtained from the base of the skull through the vertex without intravenous contrast. RADIATION DOSE REDUCTION: This exam was performed according to the departmental dose-optimization program which includes automated exposure control, adjustment of the mA and/or kV according to patient size and/or use of iterative reconstruction technique. COMPARISON:  CT head January 25, 2021. MRI head January 26, 2021. FINDINGS: Brain: Age indeterminate small perforator infarct in the left basal ganglia, new since January 26 2021. No evidence of acute hemorrhage, mass lesion, midline shift, or hydrocephalus. Vascular: No hyperdense vessel identified. Skull: No acute fracture. Sinuses/Orbits: Clear sinuses.  No acute orbital findings. Other: No mastoid effusions. IMPRESSION: 1. Age indeterminate small perforator infarct in the left basal ganglia, new since January 26 2021. If there is concern for acute infarct, MRI head could better assess. 2. Remote right basal ganglia perforator infarct. Electronically Signed   By: Gilmore GORMAN Molt M.D.   On: 10/05/2023 02:29     Labs: BMET Recent Labs  Lab 09/30/23 0527 10/01/23 0447 10/01/23 1510 10/02/23 0616  10/03/23 0334 10/04/23 0418 10/06/23 0446  NA 141 138 138 138 139 137 137  K 4.9 4.8 4.8 4.9 4.6 4.0 4.8  CL 115* 111 113* 111 112* 103 104  CO2 13* 11* 12* 11* 14* 21* 18*  GLUCOSE 110* 119* 201* 122* 105* 194* 84  BUN 67* 68* 71* 68* 69* 44* 57*  CREATININE 13.41* 13.85* 14.17* 13.12* 12.70* 9.15* 9.64*  CALCIUM  6.8* 6.4* 6.4* 6.8* 6.7* 6.5* 6.7*  PHOS 9.4* 9.0* 9.1* 8.6* 8.2*  --  5.2*   CBC Recent Labs  Lab 10/03/23 0334 10/04/23 0418 10/05/23 0514 10/06/23 0446  WBC 14.3* 15.6* 14.9* 12.8*  NEUTROABS 10.7* 11.9* 9.8* 9.7*  HGB 6.0* 8.7* 8.6* 8.9*  HCT 18.7* 26.1* 25.7* 27.9*  MCV 89.9 88.5 88.6 90.9  PLT 171 167 173 177    Medications:     apixaban   5 mg Oral BID   calcitRIOL   0.25 mcg Oral Daily   carvedilol   12.5 mg Oral BID WC   cephALEXin   500 mg Oral Q0600   Chlorhexidine  Gluconate Cloth  6 each Topical Q0600   clotrimazole   Topical BID   darbepoetin (ARANESP ) injection - NON-DIALYSIS  60 mcg Subcutaneous Q Fri-1800   docusate sodium   200 mg Oral BID   feeding supplement (GLUCERNA SHAKE)  237 mL Oral TID BM   hydrALAZINE   100 mg Oral Q8H   hydrocerin   Topical BID   insulin  aspart  0-6 Units Subcutaneous TID WC   isosorbide  mononitrate  30 mg Oral Daily   multivitamin  1 tablet Oral QHS   pantoprazole   40 mg Oral Daily   rosuvastatin   5 mg Oral Daily    Katheryn JAYSON Saba, MD 10/06/2023, 10:32 AM

## 2023-10-06 NOTE — TOC Transition Note (Signed)
 Transition of Care [facility]) - Discharge Note   Patient Details  Name: [facility] MRN: [id number] Date of Birth: [birth year]  [facility] CM/SW Contact:  Hendricks KANDICE Her, RN Phone Number: 10/06/2023, 2:01 PM   Clinical Narrative:     Patient will DC to home with Husband.  HHSN will be provided by [facility] to assist with DM teaching etc. No DME is needed    Husband to transport           Patient Goals and CMS Choice            Discharge Placement                       Discharge Plan and Services Additional resources added to the After Visit Summary for                                       Social Drivers of Health (SDOH) Interventions SDOH Screenings   Food Insecurity: No Food Insecurity (10/03/2023)  Housing: Unknown (10/03/2023)  Transportation Needs: No Transportation Needs (10/03/2023)  Utilities: Not At Risk (10/03/2023)  Depression (PHQ2-9): Low Risk  (11/13/2022)  Financial Resource Strain: Low Risk  (01/14/2023)   Received from [facility], [facility]  Physical Activity: Unknown (05/07/2022)   Received from [facility], [facility], [facility]  Social Connections: Unknown (05/10/2023)   Received from [facility]  Stress: Stress Concern Present (08/01/2022)   Received from [facility], [facility]  Tobacco Use: Low Risk  (06/23/2023)   Received from [facility]     Readmission Risk Interventions     No data to display

## 2023-10-06 NOTE — Progress Notes (Signed)
 Received patient in bed to unit.  Alert and oriented.  Informed consent signed and in chart.   TX duration:3  Patient tolerated well.  Transported back to the room  Alert, without acute distress.  Hand-off given to patient's nurse.   Access used: RIGHT FEMORAL HD CATHETER Access issues: NONE  Total UF removed: 2.5L Medication(s) given: TYLENOL , ZOFRAN , ALBUMIN    10/06/23 1137  Vitals  Temp 97.8 F (36.6 C)  Temp Source Oral  BP 115/66  MAP (mmHg) 80  BP Location Left Arm  BP Method Automatic  Patient Position (if appropriate) Lying  Pulse Rate 93  Pulse Rate Source Monitor  ECG Heart Rate 92  Resp 15  Oxygen Therapy  SpO2 99 %  O2 Device Room Air  During Treatment Monitoring  HD Safety Checks Performed Yes  Intra-Hemodialysis Comments Tx completed  Dialysis Fluid Bolus Normal Saline  Bolus Amount (mL) 300 mL      Aviyana Sonntag S [facility]

## 2023-10-06 NOTE — Progress Notes (Signed)
 Patient transported to Hemodialysis via bed by transporters.  Patient alert, oriented, and following commands.  Denies pain.  MD notified of concerns via text message.

## 2023-10-06 NOTE — Discharge Instructions (Addendum)
 Keep your dialysis catheter site clean and dry at all times, follow recommendations given by the kidney doctor and the vascular surgeon.  Do not submerge it under water  in bathtub.     Follow with Primary MD Arby Lyle LABOR, NP in 7 days   Get CBC, CMP, 2 view Chest X ray -  checked next visit with your primary MD   Activity: As tolerated with Full fall precautions use walker/cane & assistance as needed  Disposition Home    Diet: Renal Diet-low carbohydrate diet with 1.2 L fluid restriction per day.  Check CBGs q. [facility] S.    Special Instructions: If you have smoked or chewed Tobacco  in the last 2 yrs please stop smoking, stop any regular Alcohol   and or any Recreational drug use.  On your next visit with your primary care physician please Get Medicines reviewed and adjusted.  Please request your Prim.MD to go over all Hospital Tests and Procedure/Radiological results at the follow up, please get all Hospital records sent to your Prim MD by signing hospital release before you go home.  If you experience worsening of your admission symptoms, develop shortness of breath, life threatening emergency, suicidal or homicidal thoughts you must seek medical attention immediately by calling 911 or calling your MD immediately  if symptoms less severe.  You Must read complete instructions/literature along with all the possible adverse reactions/side effects for all the Medicines you take and that have been prescribed to you. Take any new Medicines after you have completely understood and accpet all the possible adverse reactions/side effects.   Do not drive when taking Pain medications.  Do not take more than prescribed Pain, Sleep and Anxiety Medications

## 2023-10-06 NOTE — Discharge Summary (Signed)
 Sherri Dixon FMW:[id number] DOB: 30-Sep-1980 DOA: 09/27/2023  PCP: Arby Lyle LABOR, NP  Admit date: 09/27/2023  Discharge date: 10/06/2023  Admitted From: Home   Disposition:  Home   Recommendations for Outpatient Follow-up:   Follow up with PCP in 1-2 weeks  PCP Please obtain BMP/CBC, 2 view CXR in 1week,  (see Discharge instructions)   PCP Please follow up on the following pending results:    Home Health: None   Equipment/Devices: None  Consultations: Renal, IR, VVS Discharge Condition: Stable    CODE STATUS: Full    Diet Recommendation: Renal Diet-low carbohydrate diet with 1.2 L fluid restriction per day.  Check CBGs q. ACH S. Needs repeat Echo in 1 week and Cards follow up.    Chief Complaint  Patient presents with   Leg Swelling     Brief history of present illness from the day of admission and additional interim summary    43 y.o. 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 She was volume overloaded and hypertensive in the ED with AKI (Cr 12.57) with acidosis (bicarb 12), also anemic with hgb 6.7g/dl. CT abd/pelvis demonstrated small volume ascites, diffuse anasarca, pericardial effusion which was confirmed to be moderate without tamponade physiology by echo. Nephrology was consulted, IV lasix  started and patient admitted to [facility] to be prepared if HD initiation is required.                                                                  Hospital Course   AKI on stage IV CKD with NAGMA : Suspect NSAID-induced injury and/or progressive CKD (has been rising Cr for months). UA  bland, 6-10 WBCs, no RBCs, no mention of casts. FENa is 4.7%.  Neurology on board, being treated with high-dose IV Lasix , CT abdomen pelvis nonacute, IR placed right IJ HD catheter twice however it did not have good return and patient could not undergo HD with it, vascular surgery was consulted and underwent right femoral dialysis catheter placement on 10/02/2023, first HD run done on 10/02/2022 with improvement in uremic symptoms.  Case discussed with Dr. Katheryn Saba nephrology after dialysis today if echo stable she will be discharged.    Anasarca, acute on chronic HFpEF: Echo with LVEF 60-65%, mod LVH, G1DD, and elevated estimate LVEDP. RV has normal size/function, normal PASP.  See above.   Acute on chronic blood loss anemia and anemia of CKD: No longer menstruating.  No ongoing bleeding, received total 3 units of packed RBC this admission with 2 units on 10/03/2023, posttransfusion H&H stable.  Likely combination of anemia of  chronic disease and hemodilution due to fluid overload prior to initiation of HD.   Pericardial effusion: No RV collapse on echo, pt actually hypertensive. Up to 1.9cm by echo. IVC not plethoric/dilated. Has had small pericardial effusion dating at least to April 2022.  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    Possible mild UTI.  Keflex  x 5 days.  PCP to follow urine culture results.    History of DVT/PE: LE venous U/S without DVT this admission.  Switched from Xarelto  to Eliquis  due to ESRD.   HTN: Continue coreg ,, Imdur , hydralazine  dose adjusted on 10/05/2023 for better control.    Hypocalcemia: Supplemented.    Pruritus - prn antihistamine, prn topical eucerin and/or aquaphor.  Definitive management may be HD.   Nausea - prn zofran , prn compazine .    History of CVA:  - Continue statin (dose decreased for renal impairment)   History of right RCC s/p partial nephrectomy: CT showed no  recurrence or signs of metastases.    Unwitnessed fall night of 10/04/2023 due to tripping.  Since she was on blood thinners head CT, x-ray of knees and pelvis were done which were all nonacute.  Age-indeterminate likely chronic infarct on CT noted.  Patient has no headache, no focal deficits whatsoever.  She is at her baseline and completely symptom-free.    Nonspecific somewhat chronic leukocytosis.  Afebrile, stable chest x-ray and UA.  Monitor.     Obesity: Body mass index is 38.85 kg/m.  Follow-up with PCP.   T2DM with peripheral neuropathy: HbA1c is 6.1% - stable only on sliding scale here, will be discharged on it along with low carbohydrate diet, requested to check CBGs q. ACH S.  PCP to monitor glycemic control.  Discharge diagnosis     Principal Problem:   AKI (acute kidney injury) (HCC) Active Problems:   Uncontrolled diabetes mellitus with hyperglycemia, with long-term current use of insulin  (HCC)   Pulmonary emboli (HCC)   Anemia   Type 2 diabetes mellitus with obesity (HCC)   History of CVA (cerebrovascular accident)   Pericardial effusion    Discharge instructions    Discharge Instructions     Discharge instructions   Complete by: As directed    Keep your dialysis catheter site clean and dry at all times, follow recommendations given by the kidney doctor and the vascular surgeon.  Do not submerge it under water  in bathtub.     Follow with Primary MD Arby Lyle LABOR, NP in 7 days   Get CBC, CMP, 2 view Chest X ray -  checked next visit with your primary MD   Activity: As tolerated with Full fall precautions use walker/cane & assistance as needed  Disposition Home    Diet: Renal Diet-low carbohydrate diet with 1.2 L fluid restriction per day.  Check CBGs q. ACH S.    Special Instructions: If you have smoked or chewed Tobacco  in the last 2 yrs please stop smoking, stop any regular Alcohol   and or any Recreational drug use.  On your next visit with your  primary care physician please Get Medicines reviewed and adjusted.  Please request your Prim.MD to go over all Hospital Tests and Procedure/Radiological results at the follow up, please get all Hospital records sent to your Prim MD by signing hospital release before you go home.  If you experience worsening of your admission symptoms, develop shortness of breath, life threatening emergency,  suicidal or homicidal thoughts you must seek medical attention immediately by calling 911 or calling your MD immediately  if symptoms less severe.  You Must read complete instructions/literature along with all the possible adverse reactions/side effects for all the Medicines you take and that have been prescribed to you. Take any new Medicines after you have completely understood and accpet all the possible adverse reactions/side effects.   Do not drive when taking Pain medications.  Do not take more than prescribed Pain, Sleep and Anxiety Medications   Increase activity slowly   Complete by: As directed    No wound care   Complete by: As directed        Discharge Medications   Allergies as of 10/06/2023       Reactions   Amlodipine Swelling   Pelvis swells   Insulin  Glargine Other (See Comments), Hives   Humalog  Kwikpen [insulin  Lispro] Other (See Comments)   75/50---swelling and ulcers in mouth         Medication List     STOP taking these medications    Dexcom G6 Receiver Devi   Dexcom G6 Transmitter Misc   Tresiba  FlexTouch 200 UNIT/ML FlexTouch Pen Generic drug: insulin  degludec   Xarelto  20 MG Tabs tablet Generic drug: rivaroxaban        TAKE these medications    Accu-Chek Guide w/Device Kit Use as directed in the morning, at noon, and at bedtime.   Accu-Chek Softclix Lancets lancets Use as directed in the morning, at noon, and at bedtime.   BD Pen Needle Nano U/F 32G X 4 MM Misc Generic drug: Insulin  Pen Needle Use as directed 4 times a day for subcutaneous insulin   injection.   carvedilol  12.5 MG tablet Commonly known as: COREG  Take 1 tablet (12.5 mg total) by mouth 2 (two) times daily with a meal. What changed:  medication strength how much to take when to take this   cephALEXin  500 MG capsule Commonly known as: KEFLEX  Take 1 capsule (500 mg total) by mouth daily.   Dexcom G6 Sensor Misc by Does not apply route.   Eliquis  5 MG Tabs tablet Generic drug: apixaban  Take 1 tablet (5 mg total) by mouth 2 (two) times daily.   furosemide  80 MG tablet Commonly known as: Lasix  Take 1 tablet (80 mg total) by mouth daily on your nondialysis days which will be Wednesday, Saturday and Sunday   glucose blood test strip Use as instructed What changed: Another medication with the same name was added. Make sure you understand how and when to take each.   Accu-Chek Guide Test test strip Generic drug: glucose blood Use as directed in the morning, at noon, and at bedtime. What changed: You were already taking a medication with the same name, and this prescription was added. Make sure you understand how and when to take each.   hydrALAZINE  100 MG tablet Commonly known as: APRESOLINE  Take 1 tablet (100 mg total) by mouth every 8 (eight) hours. What changed:  how much to take when to take this   INSULIN  SYRINGE 1CC/28G 28G X 1/2 1 ML Misc 1 application by Does not apply route 3 (three) times daily before meals.   isosorbide  mononitrate 30 MG 24 hr tablet Commonly known as: IMDUR  Take by mouth.   Lancet Device Misc 1 each by Does not apply route in the morning, at noon, and at bedtime. May substitute to any manufacturer covered by patient's insurance.   MULTIPLE VITAMINS PO Take by mouth.  nitroGLYCERIN  0.4 MG SL tablet Commonly known as: NITROSTAT  Take by mouth.   NovoLOG  FlexPen 100 UNIT/ML FlexPen Generic drug: insulin  aspart Use as directed before each meal 3 times a day, 140-199 - 2 units, 200-250 - 4 units, 251-299 - 6 units,   300-349 - 8 units,  350 or above 10 units. What changed:  how much to take how to take this when to take this additional instructions   omeprazole  20 MG capsule Commonly known as: PRILOSEC TAKE 1 CAPSULE (20 MG TOTAL) BY MOUTH 2 (TWO) TIMES DAILY BEFORE A MEAL.   rosuvastatin  40 MG tablet Commonly known as: CRESTOR  Take 1 tablet (40 mg total) by mouth daily.   Vitamin D  (Ergocalciferol ) 1.25 MG (50000 UNIT) Caps capsule Commonly known as: DRISDOL  Take 50,000 Units by mouth once a week.         Follow-up Information     Arby Lyle LABOR, NP. Schedule an appointment as soon as possible for a visit in 1 week(s).   Specialty: Nurse Practitioner Contact information: [address] [place] [address] [phone]         Jerrye Katheryn BROCKS, MD. Schedule an appointment as soon as possible for a visit in 1 week(s).   Specialty: Nephrology Contact information: [address] [place] [address] [phone]         Pearline Norman RAMAN, MD. Schedule an appointment as soon as possible for a visit in 1 week(s).   Specialty: Vascular Surgery Contact information: [address] [place] [address] [phone]         Lavona Agent, MD. Schedule an appointment as soon as possible for a visit in 1 week(s).   Specialty: Cardiology Why: Moderate pericardial effusion Contact information: [address] [place] [address] [phone]         [facility]. Go on 10/08/2023.   Why: Transitional Care Unit- Monday, Tuesday, Thursday, Friday with 7:30 am chair time.  On Thursday, please arrive at 6:45 am to complete paperwork prior to treatment. Contact information: [address] [place] [address] [phone]         [facility] Follow up.   Specialty: Home Health Services Why: Hedda will contact you within 48 hours of DC to schedule a time to begin home health Contact information: [address] [place] [address] [phone]                 Major procedures and Radiology Reports - PLEASE review detailed and final reports thoroughly  -       ECHOCARDIOGRAM LIMITED Result Date: 10/06/2023    ECHOCARDIOGRAM LIMITED REPORT   Patient Name:   Sherri Dixon Date of Exam: 10/06/2023 Medical Rec #:  [id number]     Height:       65.0 in Accession #:    [id number]    Weight:       222.4 lb Date of Birth:  06/19/1981      BSA:          2.070 m Patient Age:    42 years      BP:           153/78 mmHg Patient Gender: F             HR:           90 bpm. Exam Location:  Inpatient Procedure: Limited Echo and Cardiac Doppler Indications:    pericardial effusion  History:  Patient has prior history of Echocardiogram examinations, most                 recent 09/28/2023. Stroke; Risk Factors:Diabetes and Non-Smoker.  Sonographer:    Tillman Nora RVT RCS Referring Phys: [id number] [address]AV[address] POUR [facility] IMPRESSIONS  1. Left ventricular ejection fraction, by estimation, is 60 to 65%. The left ventricle has normal function. The left ventricle has no regional wall motion abnormalities. There is mild left ventricular hypertrophy.  2. Right ventricular systolic function is normal. The right ventricular size is normal.  3. Left atrial size was mildly dilated.  4. Effusion is slightly large than TTE done 09/28/23 IVC is not dilated and collpases with inspiration with no RA/RV diastolic collapse no tamponade Now measures 2.4 cm lateral to LV posterior wall . moderate to large. The pericardial effusion is circumferential.  5. The mitral valve was not assessed. No evidence of mitral valve regurgitation. No evidence of mitral stenosis.  6. The aortic valve was not assessed. Aortic valve regurgitation is not visualized. No aortic stenosis is present.  7. The inferior vena cava is normal in size with greater than 50% respiratory variability, suggesting right atrial pressure of 3 mmHg. FINDINGS  Left Ventricle: Left  ventricular ejection fraction, by estimation, is 60 to 65%. The left ventricle has normal function. The left ventricle has no regional wall motion abnormalities. The left ventricular internal cavity size was normal in size. There is  mild left ventricular hypertrophy. Right Ventricle: The right ventricular size is normal. No increase in right ventricular wall thickness. Right ventricular systolic function is normal. Left Atrium: Left atrial size was mildly dilated. Right Atrium: Right atrial size was normal in size. Pericardium: Effusion is slightly large than TTE done 09/28/23 IVC is not dilated and collpases with inspiration with no RA/RV diastolic collapse no tamponade Now measures 2.4 cm lateral to LV posterior wall. Moderate to large. The pericardial effusion is circumferential. Mitral Valve: The mitral valve was not assessed. There is mild thickening of the mitral valve leaflet(s). There is mild calcification of the mitral valve leaflet(s). Mild mitral annular calcification. No evidence of mitral valve stenosis. Tricuspid Valve: The tricuspid valve is normal in structure. Tricuspid valve regurgitation is not demonstrated. No evidence of tricuspid stenosis. Aortic Valve: The aortic valve was not assessed. Aortic valve regurgitation is not visualized. No aortic stenosis is present. Pulmonic Valve: The pulmonic valve was not assessed. Pulmonic valve regurgitation is not visualized. No evidence of pulmonic stenosis. Aorta: The aortic root is normal in size and structure. Venous: The inferior vena cava is normal in size with greater than 50% respiratory variability, suggesting right atrial pressure of 3 mmHg. IAS/Shunts: The interatrial septum was not assessed. LEFT VENTRICLE PLAX 2D LVIDd:         4.90 cm LVIDs:         2.30 cm LV PW:         1.10 cm LV IVS:        1.20 cm  IVC IVC diam: 1.70 cm Maude Emmer MD Electronically signed by Maude Emmer MD Signature Date/Time: 10/06/2023/4:24:41 PM    Final     PERIPHERAL VASCULAR CATHETERIZATION Result Date: 10/05/2023 Images from the original result were not included.   Patient name: Sherri Dixon    MRN: [id number]        DOB: [birth year]            Sex: female  10/02/2023 Pre-operative Diagnosis: Acute renal failure necessitating HD Post-operative diagnosis:  Same Surgeon:  Norman GORMAN Serve, MD Procedure Performed:  Ultrasound-guided access of right common femoral vein Right femoral vein tunneled dialysis catheter placement  Indications:  This is a 44 y.o. female in need of urgent dialysis due to acute renal failure with anasarca and a pericardial effusion who has had multiple failures of her right IJ line despite multiple exchanges by IR. The imaging from her IR exchanges were reviewed which demonstrated excellent placement into the right atrium and is unclear why her mind continues to fail as she is also on therapeutic Lovenox  for history of DVT PE. Explained that our only remaining option is to place a femoral line.  Risks and benefits were reviewed with the patient and she elected to proceed.  Findings: Compressible right common femoral vein.  Adequately placed right femoral tunneled dialysis catheter into IVC             Procedure:  The patient was identified in the holding area and taken to the cath lab  The patient was then placed supine on the table and prepped and draped in the usual sterile fashion.  A time out was called.  Using ultrasound guidance the right femoral vein was accessed with micropuncture technique.  Through the micropuncture sheath, the guidewire was advanced into the inferior vena cava.  A small incision was made around the skin access point.  The access point was serially dilated under direct fluoroscopic guidance.  A peel-away sheath was introduced into the superior vena cava under fluoroscopic guidance.  A counterincision was made in the lateral aspect of the thigh.  A 55 cm tunnel dialysis catheter was then tunneled under the skin, into the  right groin access site incision.  The tunneling device was removed and the catheter fed through the peel-away sheath into the inferior vena cava.  The peel-away sheath was removed and the catheter gently pulled back.  Adequate position was confirmed with x-ray.  The catheter was tested and found to aspirate and flush with ease.    The catheter was sutured to the skin and the groin incision was closed with 4-0 Monocryl.  The catheter was then capped and heparin  locked.  Contrast: none Sedation: none   Norman GORMAN Serve MD [facility] Office: [phone]   DG Knee 1-2 Views Right Result Date: 10/05/2023 CLINICAL DATA:  Fall, leg pain, swelling EXAM: RIGHT KNEE - 1-2 VIEW COMPARISON:  None Available. FINDINGS: Slight joint space narrowing. No acute bony abnormality. Specifically, no fracture, subluxation, or dislocation. No joint effusion. IMPRESSION: No acute bony abnormality. Electronically Signed   By: Franky Crease M.D.   On: 10/05/2023 02:38   DG Knee 1-2 Views Left Result Date: 10/05/2023 CLINICAL DATA:  Fall, leg pain, swelling EXAM: LEFT KNEE - 1-2 VIEW COMPARISON:  None Available. FINDINGS: No acute bony abnormality. Specifically, no fracture, subluxation, or dislocation. Early degenerative changes with early joint space narrowing and spurring. IMPRESSION: No acute bony abnormality. Electronically Signed   By: Franky Crease M.D.   On: 10/05/2023 02:38   DG Pelvis 1-2 Views Result Date: 10/05/2023 CLINICAL DATA:  Fall, leg swelling EXAM: PELVIS - 1-2 VIEW COMPARISON:  None Available. FINDINGS: There is no evidence of pelvic fracture or diastasis. No pelvic bone lesions are seen. Hip joints and SI joints symmetric. Right groin dialysis catheter partially visualized. IMPRESSION: No acute bony abnormality. Electronically Signed   By: Franky Crease M.D.   On: 10/05/2023 02:37   CT HEAD WO CONTRAST ( )  Result Date: 10/05/2023 CLINICAL DATA:  Ataxia, head trauma fall EXAM: CT  HEAD WITHOUT CONTRAST TECHNIQUE: Contiguous axial images were obtained from the base of the skull through the vertex without intravenous contrast. RADIATION DOSE REDUCTION: This exam was performed according to the departmental dose-optimization program which includes automated exposure control, adjustment of the mA and/or kV according to patient size and/or use of iterative reconstruction technique. COMPARISON:  CT head January 25, 2021. MRI head January 26, 2021. FINDINGS: Brain: Age indeterminate small perforator infarct in the left basal ganglia, new since January 26 2021. No evidence of acute hemorrhage, mass lesion, midline shift, or hydrocephalus. Vascular: No hyperdense vessel identified. Skull: No acute fracture. Sinuses/Orbits: Clear sinuses.  No acute orbital findings. Other: No mastoid effusions. IMPRESSION: 1. Age indeterminate small perforator infarct in the left basal ganglia, new since January 26 2021. If there is concern for acute infarct, MRI head could better assess. 2. Remote right basal ganglia perforator infarct. Electronically Signed   By: Gilmore GORMAN Molt M.D.   On: 10/05/2023 02:29   IR Fluoro Guide CV Line Right Result Date: 10/02/2023 INDICATION: 43 year old female with nonfunctional temporary right IJ hemodialysis catheter EXAM: IMAGE GUIDED EXCHANGE OF RIGHT IJ TEMPORARY HEMODIALYSIS CATHETER MEDICATIONS: NONE ANESTHESIA/SEDATION: Moderate (conscious) sedation was NOT employed during this procedure. FLUOROSCOPY: Radiation Exposure Index (as provided by the fluoroscopic device): 1.0 mGy Kerma COMPLICATIONS: None PROCEDURE: Informed written consent was obtained from the patient after a discussion of the risks, benefits, and alternatives to treatment. Questions regarding the procedure were encouraged and answered. The right neck and chest, including the indwelling catheter were prepped with chlorhexidine  in a sterile fashion, and a sterile drape was applied covering the operative field. Maximum  barrier sterile technique with sterile gowns and gloves were used for the procedure. A timeout was performed prior to the initiation of the procedure. Scout image was acquired. Wire was passed through the catheter into the IVC under fluoroscopy. The suture was ligated at the neck. And the 20 cm catheter was exchanged for a 16 cm temporary triple-lumen hemodialysis catheter. Wire was removed and a final image was stored demonstrating that the catheter was present within the upper right atrium. The catheter was flushed with appropriate volume heparin  dwells. 1% lidocaine  was used for local anesthesia. The catheter exit site was secured with a 0-Prolene retention suture. Dressings were applied. The patient tolerated the procedure well without immediate post procedural complication. IMPRESSION: Status post image guided exchange of right IJ temporary hemodialysis catheter. Signed, Ami GORMAN. Alona ROSALEA GRAVER, RPVI [facility] Electronically Signed   By: Ami Alona D.O.   On: 10/02/2023 09:51   IR Fluoro Guide CV Line Right Result Date: 10/02/2023 INDICATION: 43 year old female with history of acute kidney injury status post temporary hemodialysis catheter earlier the same day which was not functioning during hemodialysis. The patient presents for catheter check and exchange. EXAM: Temporary CENTRAL VENOUS HEMODIALYSIS CATHETER REPLACEMENT WITH FLUOROSCOPIC GUIDANCE MEDICATIONS: None FLUOROSCOPY TIME:  One mGy COMPLICATIONS: None immediate. PROCEDURE: Informed written consent was obtained from the patient after a discussion of the risks, benefits, and alternatives to treatment. Questions regarding the procedure were encouraged and answered. The skin and external portion of the existing hemodialysis catheter was prepped with chlorhexidine  in a sterile fashion, and a sterile drape was applied covering the operative field. Maximum barrier sterile technique with  sterile gowns and gloves were used for the procedure. A timeout was performed prior to the initiation of the  procedure. The central lumen of the indwelling hemodialysis catheter aspirated and flushed without difficulty. The red lumen would not aspirate. The blue lumen aspirated and flushed without difficulty. Fluoroscopic evaluation demonstrated the catheter tip in the superior aspect of the right atrium. Local anesthetic was applied to the catheter entry site with 1% lidocaine . The central lumen was cannulated with a Rosen wire was directed under fluoroscopic guidance to the inferior vena cava. The catheter was removed and exchanged for a new, 20 cm Trialysis catheter. The catheter tip was positioned in the inferior aspect of the right atrium. All 3 lumens flushed and aspirated without difficulty. The catheter was flushed with appropriate volume heparin  dwell as. The catheter was affixed to the skin with an interrupted 0 silk suture. A sterile bandage was applied. The patient tolerated the procedure well without immediate post procedural complication. IMPRESSION: Successful exchange of indwelling non tunneled hemodialysis catheter for 20 cm Trialysis catheter with tips terminating within the right atrium. The catheter is ready for immediate use. Ester Sides, MD [facility] Electronically Signed   By: Ester Sides M.D.   On: 10/02/2023 07:54   IR Fluoro Guide CV Line Right Result Date: 10/01/2023 CLINICAL DATA:  Acute kidney injury and need for hemodialysis. EXAM: NON-TUNNELED CENTRAL VENOUS HEMODIALYSIS CATHETER PLACEMENT WITH ULTRASOUND AND FLUOROSCOPIC GUIDANCE FLUOROSCOPY: 6 seconds.  3.0 mGy. PROCEDURE: The procedure, risks, benefits, and alternatives were explained to the patient. Questions regarding the procedure were encouraged and answered. The patient understands and consents to the procedure. A time-out was performed prior to initiating the  procedure. The right neck and chest were prepped with chlorhexidine  in a sterile fashion, and a sterile drape was applied covering the operative field. Maximum barrier sterile technique with sterile gowns and gloves were used for the procedure. Local anesthesia was provided with 1% lidocaine . Ultrasound was used to confirm patency of the right internal jugular vein. An ultrasound image was saved recorded. After creating a small venotomy incision, a 21 gauge needle was advanced into the right internal jugular vein under direct, real-time ultrasound guidance. Ultrasound image documentation was performed. After securing guidewire access venous access was dilated over the wire. A 16 cm, 13 French triple-lumen Mahurkar non tunneled hemodialysis catheter was advanced over the wire. Final catheter positioning was confirmed and documented with a fluoroscopic spot image. The catheter was aspirated, flushed with saline, and injected with appropriate volume heparin  dwells. The catheter exit site was secured with 0-Prolene retention sutures. COMPLICATIONS: None.  No pneumothorax. FINDINGS: After catheter placement, the tip lies in the right atrium. The catheter aspirates normally and is ready for immediate use. IMPRESSION: Placement of non-tunneled central venous hemodialysis catheter via the right internal jugular vein. The catheter tip lies in the right atrium. The catheter is ready for immediate use. Electronically Signed   By: Marcey Moan M.D.   On: 10/01/2023 12:44   IR US  Guide Vasc Access Right Result Date: 10/01/2023 CLINICAL DATA:  Acute kidney injury and need for hemodialysis. EXAM: NON-TUNNELED CENTRAL VENOUS HEMODIALYSIS CATHETER PLACEMENT WITH ULTRASOUND AND FLUOROSCOPIC GUIDANCE FLUOROSCOPY: 6 seconds.  3.0 mGy. PROCEDURE: The procedure, risks, benefits, and alternatives were explained to the patient. Questions regarding the procedure were encouraged and answered. The patient understands and consents to the  procedure. A time-out was performed prior to initiating the procedure. The right neck and chest were prepped with chlorhexidine  in a sterile fashion, and a sterile drape was applied covering the operative field. Maximum barrier sterile  technique with sterile gowns and gloves were used for the procedure. Local anesthesia was provided with 1% lidocaine . Ultrasound was used to confirm patency of the right internal jugular vein. An ultrasound image was saved recorded. After creating a small venotomy incision, a 21 gauge needle was advanced into the right internal jugular vein under direct, real-time ultrasound guidance. Ultrasound image documentation was performed. After securing guidewire access venous access was dilated over the wire. A 16 cm, 13 French triple-lumen Mahurkar non tunneled hemodialysis catheter was advanced over the wire. Final catheter positioning was confirmed and documented with a fluoroscopic spot image. The catheter was aspirated, flushed with saline, and injected with appropriate volume heparin  dwells. The catheter exit site was secured with 0-Prolene retention sutures. COMPLICATIONS: None.  No pneumothorax. FINDINGS: After catheter placement, the tip lies in the right atrium. The catheter aspirates normally and is ready for immediate use. IMPRESSION: Placement of non-tunneled central venous hemodialysis catheter via the right internal jugular vein. The catheter tip lies in the right atrium. The catheter is ready for immediate use. Electronically Signed   By: Marcey Moan M.D.   On: 10/01/2023 12:44   DG Chest Port 1 View Result Date: 09/30/2023 CLINICAL DATA:  Shortness of breath.  CHF. EXAM: PORTABLE CHEST 1 VIEW COMPARISON:  12/12/2020 FINDINGS: The cardio pericardial silhouette is enlarged. The lungs are clear without focal pneumonia, edema, pneumothorax or pleural effusion. No acute bony abnormality. IMPRESSION: Enlargement of the cardiopericardial silhouette without acute  cardiopulmonary findings. Electronically Signed   By: Camellia Candle M.D.   On: 09/30/2023 08:58   ECHOCARDIOGRAM COMPLETE Result Date: 09/28/2023    ECHOCARDIOGRAM REPORT   Patient Name:   Sherri Dixon Date of Exam: 09/28/2023 Medical Rec #:  [id number]     Height:       65.0 in Accession #:    [id number]    Weight:       232.0 lb Date of Birth:  Sep 16, 1981      BSA:          2.107 m Patient Age:    42 years      BP:           165/81 mmHg Patient Gender: F             HR:           85 bpm. Exam Location:  Inpatient Procedure: 2D Echo, Cardiac Doppler and Color Doppler Indications:    abnormal ecg, pericardial effusion  History:        Patient has prior history of Echocardiogram examinations, most                 recent 12/04/2020. Risk Factors:Diabetes.  Sonographer:    Ozell Free Referring Phys: [id number] CAROLE N HALL IMPRESSIONS  1. Left ventricular ejection fraction, by estimation, is 60 to 65%. The left ventricle has normal function. The left ventricle has no regional wall motion abnormalities. There is moderate concentric left ventricular hypertrophy. Left ventricular diastolic parameters are consistent with Grade I diastolic dysfunction (impaired relaxation). Elevated left ventricular end-diastolic pressure.  2. Right ventricular systolic function is normal. The right ventricular size is normal. There is normal pulmonary artery systolic pressure.  3. Left atrial size was severely dilated.  4. Pericardial effusion measuring up to 1.9 cm. Moderate pericardial effusion. There is no evidence of cardiac tamponade.  5. The mitral valve is normal in structure. No evidence of mitral valve regurgitation. No evidence of mitral stenosis.  6. The aortic  valve is tricuspid. Aortic valve regurgitation is not visualized. No aortic stenosis is present.  7. The inferior vena cava is normal in size with greater than 50% respiratory variability, suggesting right atrial pressure of 3 mmHg. FINDINGS  Left Ventricle: Left  ventricular ejection fraction, by estimation, is 60 to 65%. The left ventricle has normal function. The left ventricle has no regional wall motion abnormalities. The left ventricular internal cavity size was normal in size. There is  moderate concentric left ventricular hypertrophy. Left ventricular diastolic parameters are consistent with Grade I diastolic dysfunction (impaired relaxation). Elevated left ventricular end-diastolic pressure. Right Ventricle: The right ventricular size is normal. No increase in right ventricular wall thickness. Right ventricular systolic function is normal. There is normal pulmonary artery systolic pressure. The tricuspid regurgitant velocity is 2.08 m/s, and  with an assumed right atrial pressure of 3 mmHg, the estimated right ventricular systolic pressure is 20.3 mmHg. Left Atrium: Left atrial size was severely dilated. Right Atrium: Right atrial size was normal in size. Pericardium: Pericardial effusion measuring up to 1.9 cm. A moderately sized pericardial effusion is present. There is excessive respiratory variation in the tricuspid valve spectral Doppler velocities and diastolic collapse of the right atrial wall. There is no evidence of cardiac tamponade. Mitral Valve: The mitral valve is normal in structure. No evidence of mitral valve regurgitation. No evidence of mitral valve stenosis. MV peak gradient, 10.0 mmHg. The mean mitral valve gradient is 5.0 mmHg. Tricuspid Valve: The tricuspid valve is normal in structure. Tricuspid valve regurgitation is trivial. No evidence of tricuspid stenosis. Aortic Valve: The aortic valve is tricuspid. Aortic valve regurgitation is not visualized. No aortic stenosis is present. Aortic valve mean gradient measures 5.0 mmHg. Aortic valve peak gradient measures 8.1 mmHg. Aortic valve area, by VTI measures 2.48 cm. Pulmonic Valve: The pulmonic valve was normal in structure. Pulmonic valve regurgitation is not visualized. No evidence of pulmonic  stenosis. Aorta: The aortic root is normal in size and structure. Venous: The inferior vena cava is normal in size with greater than 50% respiratory variability, suggesting right atrial pressure of 3 mmHg. IAS/Shunts: No atrial level shunt detected by color flow Doppler.  LEFT VENTRICLE PLAX 2D LVIDd:         4.80 cm   Diastology LVIDs:         2.90 cm   LV e' medial:    3.81 cm/s LV PW:         1.50 cm   LV E/e' medial:  32.8 LV IVS:        1.60 cm   LV e' lateral:   4.57 cm/s LVOT diam:     2.00 cm   LV E/e' lateral: 27.4 LV SV:         80 LV SV Index:   38 LVOT Area:     3.14 cm  RIGHT VENTRICLE             IVC RV Basal diam:  4.10 cm     IVC diam: 1.80 cm RV S prime:     12.50 cm/s TAPSE (M-mode): 2.3 cm LEFT ATRIUM             Index        RIGHT ATRIUM           Index LA diam:        4.70 cm 2.23 cm/m   RA Area:     16.70 cm LA Vol (A2C):   87.1 ml 41.34  ml/m  RA Volume:   40.30 ml  19.13 ml/m LA Vol (A4C):   61.3 ml 29.09 ml/m LA Biplane Vol: 79.9 ml 37.92 ml/m  AORTIC VALVE AV Area (Vmax):    2.52 cm AV Area (Vmean):   2.19 cm AV Area (VTI):     2.48 cm AV Vmax:           142.00 cm/s AV Vmean:          111.000 cm/s AV VTI:            0.323 m AV Peak Grad:      8.1 mmHg AV Mean Grad:      5.0 mmHg LVOT Vmax:         114.00 cm/s LVOT Vmean:        77.400 cm/s LVOT VTI:          0.255 m LVOT/AV VTI ratio: 0.79  AORTA Ao Root diam: 2.80 cm Ao Asc diam:  3.20 cm MITRAL VALVE                TRICUSPID VALVE MV Area (PHT): 2.91 cm     TR Peak grad:   17.3 mmHg MV Area VTI:   2.23 cm     TR Vmax:        208.00 cm/s MV Peak grad:  10.0 mmHg MV Mean grad:  5.0 mmHg     SHUNTS MV Vmax:       1.58 m/s     Systemic VTI:  0.26 m MV Vmean:      104.0 cm/s   Systemic Diam: 2.00 cm MV Decel Time: 261 msec MV E velocity: 125.00 cm/s MV A velocity: 140.00 cm/s MV E/A ratio:  0.89 Annabella Scarce MD Electronically signed by Annabella Scarce MD Signature Date/Time: 09/28/2023/3:42:48 PM    Final    VAS US  LOWER  EXTREMITY VENOUS (DVT) Result Date: 09/28/2023  Lower Venous DVT Study Patient Name:  Sherri Dixon  Date of Exam:   09/28/2023 Medical Rec #: [id number]      Accession #:    [id number] Date of Birth: Nov 16, 1980       Patient Gender: F Patient Age:   22 years Exam [facility]  [facility] Procedure:      VAS US  LOWER EXTREMITY VENOUS (DVT) Referring Phys: TERRY HALL --------------------------------------------------------------------------------  Indications: Swelling, and Edema.  Risk Factors: Hx of PE obesity. Limitations: Body habitus. Comparison Study: Significant changes seen since previous exam 05/22/18. Performing Technologist: Garnette Rockers  Examination Guidelines: A complete evaluation includes B-mode imaging, spectral Doppler, color Doppler, and power Doppler as needed of all accessible portions of each vessel. Bilateral testing is considered an integral part of a complete examination. Limited examinations for reoccurring indications may be performed as noted. The reflux portion of the exam is performed with the patient in reverse Trendelenburg.  +---------+---------------+---------+-----------+----------+-------------------+ RIGHT    CompressibilityPhasicitySpontaneityPropertiesThrombus Aging      +---------+---------------+---------+-----------+----------+-------------------+ CFV      Full           Yes      Yes                                      +---------+---------------+---------+-----------+----------+-------------------+ SFJ      Full                                                             +---------+---------------+---------+-----------+----------+-------------------+  FV Prox  Full                                                             +---------+---------------+---------+-----------+----------+-------------------+ FV Mid   Full                                                              +---------+---------------+---------+-----------+----------+-------------------+ FV DistalFull                                                             +---------+---------------+---------+-----------+----------+-------------------+ PFV      Full                                                             +---------+---------------+---------+-----------+----------+-------------------+ POP      Full           Yes      Yes                                      +---------+---------------+---------+-----------+----------+-------------------+ PTV                              Yes                  Not well visualized +---------+---------------+---------+-----------+----------+-------------------+ PERO                             Yes                  Not well visualized +---------+---------------+---------+-----------+----------+-------------------+   +---------+---------------+---------+-----------+----------+-------------------+ LEFT     CompressibilityPhasicitySpontaneityPropertiesThrombus Aging      +---------+---------------+---------+-----------+----------+-------------------+ CFV      Full           Yes      Yes                                      +---------+---------------+---------+-----------+----------+-------------------+ SFJ      Full                                                             +---------+---------------+---------+-----------+----------+-------------------+ FV Prox  Full                                                             +---------+---------------+---------+-----------+----------+-------------------+  FV Mid   Full                                                             +---------+---------------+---------+-----------+----------+-------------------+ FV DistalFull                                                             +---------+---------------+---------+-----------+----------+-------------------+  PFV      Full                                                             +---------+---------------+---------+-----------+----------+-------------------+ POP      Full           Yes      Yes                                      +---------+---------------+---------+-----------+----------+-------------------+ PTV                              Yes                  Not well visualized +---------+---------------+---------+-----------+----------+-------------------+ PERO                             Yes                  Not well visualized +---------+---------------+---------+-----------+----------+-------------------+     Summary: BILATERAL: - No evidence of deep vein thrombosis seen in the lower extremities, bilaterally. -No evidence of popliteal cyst, bilaterally.   *See table(s) above for measurements and observations. Electronically signed by Norman Serve on 09/28/2023 at 10:51:02 AM.    Final    CT ABDOMEN PELVIS WO CONTRAST Result Date: 09/28/2023 CLINICAL DATA:  Provided history: Kidney cancer, post nephrectomy, monitor EXAM: CT ABDOMEN AND PELVIS WITHOUT CONTRAST TECHNIQUE: Multidetector CT imaging of the abdomen and pelvis was performed following the standard protocol without IV contrast. RADIATION DOSE REDUCTION: This exam was performed according to the departmental dose-optimization program which includes automated exposure control, adjustment of the mA and/or kV according to patient size and/or use of iterative reconstruction technique. COMPARISON:  Renal ultrasound 06/15/2023, CT 11/08/2019 FINDINGS: Lower chest: The heart is mildly enlarged. Moderate circumferential pericardial effusion measuring up to 2.1 cm adjacent to the right ventricle. Mitral annulus calcifications. No pleural effusion. Hepatobiliary: No evidence of focal liver lesion on this unenhanced exam. The liver is enlarged spanning 21.3 cm cranial caudal. Gallbladder physiologically distended, no calcified  stone. No biliary dilatation. Pancreas: No ductal dilatation or inflammation. Spleen: Normal in size without focal abnormality. Adrenals/Urinary Tract: Normal adrenal glands. Contour deformity with probable postsurgical change in the upper right kidney. Simple appearing cyst in the mid right kidney, simple fluid density. No evidence of solid  lesion. No hydronephrosis or renal calculi of either kidney. Decompressed ureters. Mild wall thickening about the anterior bladder. Stomach/Bowel: Unremarkable appearance of the stomach. Occasional fluid-filled small bowel in the pelvis. No obstruction or inflammation. Colonic redundancy with small to moderate volume of stool in the colon. Vascular/Lymphatic: Normal caliber abdominal aorta. Mild but age advanced aortic atherosclerosis. No enlarged lymph nodes. Prominent ileocolic node at 9 mm is unchanged from prior CT. Reproductive: Suspected anterior fundal fibroid. Ovaries are not well-defined. No suspicious adnexal mass. Other: Small volume abdominopelvic ascites. Right lower ventral abdominal wall hernia containing fat and small amount of ascites. There is generalized subcutaneous body wall edema. Musculoskeletal: No focal bone lesion.  No acute osseous finding. IMPRESSION: 1. Postsurgical change in the upper right kidney. No evidence of recurrent or metastatic disease in the abdomen/pelvis. 2. Hepatomegaly. 3. Small volume abdominopelvic ascites. Generalized subcutaneous body wall edema. 4. Moderate circumferential pericardial effusion. 5. Mild wall thickening about the anterior bladder, may be due to underdistention or cystitis. Recommend correlation with urinalysis. 6. Right lower ventral abdominal wall hernia containing fat and small amount of ascites. Aortic Atherosclerosis (ICD10-I70.0). Electronically Signed   By: Andrea Gasman M.D.   On: 09/28/2023 02:58    Micro Results     No results found for this or any previous visit (from the past 240 hours).  Today    Subjective    Sherri Dixon today has no headache,no chest abdominal pain,no new weakness tingling or numbness, feels much better wants to go home today.     Objective   Blood pressure (!) 153/78, pulse 96, temperature 97.7 F (36.5 C), temperature source Oral, resp. rate 11, weight 100.9 kg, last menstrual period 09/25/2023, SpO2 100%.   Intake/Output Summary (Last 24 hours) at 10/06/2023 1630 Last data filed at 10/06/2023 1145 Gross per 24 hour  Intake --  Output 3000 ml  Net -3000 ml    Exam  Awake Alert, No new F.N deficits,    [place].AT,PERRAL Supple Neck,   Symmetrical Chest wall movement, Good air movement bilaterally, CTAB RRR,No Gallops,   +ve B.Sounds, Abd Soft, Non tender,  No Cyanosis, right groin HD catheter   Data Review   Recent Labs  Lab 10/02/23 0616 10/03/23 0334 10/04/23 0418 10/05/23 0514 10/06/23 0446  WBC 16.6* 14.3* 15.6* 14.9* 12.8*  HGB 6.9* 6.0* 8.7* 8.6* 8.9*  HCT 22.4* 18.7* 26.1* 25.7* 27.9*  PLT 202 171 167 173 177  MCV 90.3 89.9 88.5 88.6 90.9  MCH 27.8 28.8 29.5 29.7 29.0  MCHC 30.8 32.1 33.3 33.5 31.9  RDW 14.6 14.5 14.6 14.6 14.6  LYMPHSABS 1.0 1.1 0.9 1.6 1.2  MONOABS 1.2* 1.1* 1.1* 1.2* 0.4  EOSABS 1.0* 1.2* 1.3* 1.9* 1.5*  BASOSABS 0.0 0.0 0.0 0.0 0.0    Recent Labs  Lab 09/30/23 0527 10/01/23 0447 10/01/23 1510 10/01/23 1519 10/02/23 0616 10/03/23 0334 10/04/23 0418 10/04/23 1021 10/05/23 0514 10/06/23 0446  NA 141 138 138  --  138 139 137  --   --  137  K 4.9 4.8 4.8  --  4.9 4.6 4.0  --   --  4.8  CL 115* 111 113*  --  111 112* 103  --   --  104  CO2 13* 11* 12*  --  11* 14* 21*  --   --  18*  ANIONGAP 13 16* 13  --  16* 13 13  --   --  15  GLUCOSE 110* 119* 201*  --  122*  105* 194*  --   --  84  BUN 67* 68* 71*  --  68* 69* 44*  --   --  57*  CREATININE 13.41* 13.85* 14.17*  --  13.12* 12.70* 9.15*  --   --  9.64*  AST 16 14*  --   --  18 19 16   --   --   --   ALT 15 14  --   --  10 15 16   --   --   --    ALKPHOS 99 103  --   --  103 96 104  --   --   --   BILITOT 0.4 0.7  --   --  0.6 0.6 0.7  --   --   --   ALBUMIN  2.1* 2.1* 1.7*  --  1.8* 1.7* 1.8*  --   --  1.7*  CRP  --   --  4.7*  --  11.7* 10.0* 7.8*  --  8.8*  --   PROCALCITON  --   --   --    < > 0.74 0.74 0.72  --  0.68 0.47  INR 1.1  --   --   --   --   --   --   --   --   --   AMMONIA  --   --   --   --   --   --   --  28 19 15   BNP 717.8* 590.8*  --   --  509.8* 490.9*  --   --   --   --   MG 1.9 2.0  --   --  1.8 1.9  --   --   --   --   CALCIUM  6.8* 6.4* 6.4*  --  6.8* 6.7* 6.5*  --   --  6.7*   < > = values in this interval not displayed.    Total Time in preparing paper work, data evaluation and todays exam - 35 minutes  Signature  -    Lavada Stank M.D on 10/06/2023 at 4:30 PM   -  To page go to [email or web address]

## 2023-10-06 NOTE — Progress Notes (Signed)
 Called echo for an ETA and to notify that pt is back from HD and needs an echo today so pt can be discharged home.

## 2023-10-06 NOTE — Progress Notes (Signed)
*  PRELIMINARY RESULTS* Echocardiogram 2D Echocardiogram has been performed.  Sherri Dixon 10/06/2023, 3:34 PM

## 2023-10-06 NOTE — Progress Notes (Addendum)
 Patient given discharge information on Kidney Disease, hemodialysis, Central line catheter site care and what to report, Pericardial effusions, and hypertension and others.  Transported without difficulty from wheelchair to vehicle with husband.

## 2023-10-07 ENCOUNTER — Telehealth (HOSPITAL_COMMUNITY): Payer: Self-pay | Admitting: Nephrology

## 2023-10-07 LAB — PARATHYROID HORMONE, INTACT (NO CA): PTH: 257 pg/mL — ABNORMAL HIGH (ref 15–65)

## 2023-10-07 NOTE — Telephone Encounter (Signed)
 Transition of Care - Initial Contact from Inpatient Facility  Date of discharge: 10/06/23 Date of contact: 10/07/23 Method: Phone Spoke to: Patient  Patient contacted to discuss transition of care from recent inpatient hospitalization. Patient was admitted to [facility] from 12/29 - 10/06/23 with discharge diagnosis of new ESRD, anasarca, pericardial effusion, hypocalcemia, anemia.  The discharge medication list was reviewed. Patient understands the changes and has no concerns.   Patient will start at her HD unit tomorrow - knows where to go and what time. Reminded to wear clothing so that her femoral TDC can be accessed.  No other concerns at this time.  Sherri Boehringer, PA-C [employer] Pager [phone]

## 2023-10-12 ENCOUNTER — Other Ambulatory Visit: Payer: Self-pay

## 2023-10-12 ENCOUNTER — Encounter (HOSPITAL_COMMUNITY): Payer: Self-pay

## 2023-10-12 ENCOUNTER — Emergency Department (HOSPITAL_COMMUNITY): Payer: Medicare Other

## 2023-10-12 ENCOUNTER — Inpatient Hospital Stay (HOSPITAL_COMMUNITY): Payer: Medicare Other

## 2023-10-12 ENCOUNTER — Inpatient Hospital Stay (HOSPITAL_COMMUNITY)
Admission: EM | Admit: 2023-10-12 | Discharge: 2023-10-14 | DRG: 689 | Disposition: A | Discharge status: Home / Self Care | Payer: Medicare Other | Attending: Internal Medicine | Admitting: Internal Medicine

## 2023-10-12 DIAGNOSIS — Z8249 Family history of ischemic heart disease and other diseases of the circulatory system: Secondary | ICD-10-CM | POA: Diagnosis not present

## 2023-10-12 DIAGNOSIS — Z7901 Long term (current) use of anticoagulants: Secondary | ICD-10-CM | POA: Diagnosis not present

## 2023-10-12 DIAGNOSIS — M898X9 Other specified disorders of bone, unspecified site: Secondary | ICD-10-CM | POA: Diagnosis present

## 2023-10-12 DIAGNOSIS — Z79899 Other long term (current) drug therapy: Secondary | ICD-10-CM | POA: Diagnosis not present

## 2023-10-12 DIAGNOSIS — N186 End stage renal disease: Secondary | ICD-10-CM

## 2023-10-12 DIAGNOSIS — N939 Abnormal uterine and vaginal bleeding, unspecified: Secondary | ICD-10-CM | POA: Diagnosis not present

## 2023-10-12 DIAGNOSIS — Z8261 Family history of arthritis: Secondary | ICD-10-CM

## 2023-10-12 DIAGNOSIS — Z992 Dependence on renal dialysis: Secondary | ICD-10-CM

## 2023-10-12 DIAGNOSIS — I10 Essential (primary) hypertension: Secondary | ICD-10-CM | POA: Diagnosis not present

## 2023-10-12 DIAGNOSIS — E66813 Obesity, class 3: Secondary | ICD-10-CM | POA: Diagnosis present

## 2023-10-12 DIAGNOSIS — I5032 Chronic diastolic (congestive) heart failure: Secondary | ICD-10-CM | POA: Diagnosis present

## 2023-10-12 DIAGNOSIS — L299 Pruritus, unspecified: Secondary | ICD-10-CM

## 2023-10-12 DIAGNOSIS — Z86711 Personal history of pulmonary embolism: Secondary | ICD-10-CM

## 2023-10-12 DIAGNOSIS — F419 Anxiety disorder, unspecified: Secondary | ICD-10-CM | POA: Diagnosis present

## 2023-10-12 DIAGNOSIS — Z888 Allergy status to other drugs, medicaments and biological substances status: Secondary | ICD-10-CM | POA: Diagnosis not present

## 2023-10-12 DIAGNOSIS — D631 Anemia in chronic kidney disease: Secondary | ICD-10-CM | POA: Diagnosis present

## 2023-10-12 DIAGNOSIS — I132 Hypertensive heart and chronic kidney disease with heart failure and with stage 5 chronic kidney disease, or end stage renal disease: Secondary | ICD-10-CM | POA: Diagnosis present

## 2023-10-12 DIAGNOSIS — N2581 Secondary hyperparathyroidism of renal origin: Secondary | ICD-10-CM | POA: Diagnosis present

## 2023-10-12 DIAGNOSIS — I3139 Other pericardial effusion (noninflammatory): Secondary | ICD-10-CM

## 2023-10-12 DIAGNOSIS — D638 Anemia in other chronic diseases classified elsewhere: Secondary | ICD-10-CM

## 2023-10-12 DIAGNOSIS — N390 Urinary tract infection, site not specified: Secondary | ICD-10-CM | POA: Diagnosis not present

## 2023-10-12 DIAGNOSIS — Z6837 Body mass index (BMI) 37.0-37.9, adult: Secondary | ICD-10-CM | POA: Diagnosis not present

## 2023-10-12 DIAGNOSIS — K219 Gastro-esophageal reflux disease without esophagitis: Secondary | ICD-10-CM | POA: Diagnosis present

## 2023-10-12 DIAGNOSIS — D721 Eosinophilia, unspecified: Secondary | ICD-10-CM | POA: Diagnosis present

## 2023-10-12 DIAGNOSIS — Z83438 Family history of other disorder of lipoprotein metabolism and other lipidemia: Secondary | ICD-10-CM

## 2023-10-12 DIAGNOSIS — E785 Hyperlipidemia, unspecified: Secondary | ICD-10-CM | POA: Diagnosis present

## 2023-10-12 DIAGNOSIS — D72829 Elevated white blood cell count, unspecified: Secondary | ICD-10-CM

## 2023-10-12 DIAGNOSIS — E1122 Type 2 diabetes mellitus with diabetic chronic kidney disease: Secondary | ICD-10-CM | POA: Diagnosis present

## 2023-10-12 DIAGNOSIS — Z905 Acquired absence of kidney: Secondary | ICD-10-CM | POA: Diagnosis not present

## 2023-10-12 DIAGNOSIS — N3000 Acute cystitis without hematuria: Secondary | ICD-10-CM | POA: Diagnosis not present

## 2023-10-12 DIAGNOSIS — R112 Nausea with vomiting, unspecified: Principal | ICD-10-CM

## 2023-10-12 DIAGNOSIS — K2970 Gastritis, unspecified, without bleeding: Secondary | ICD-10-CM | POA: Diagnosis present

## 2023-10-12 DIAGNOSIS — K920 Hematemesis: Secondary | ICD-10-CM

## 2023-10-12 DIAGNOSIS — Z833 Family history of diabetes mellitus: Secondary | ICD-10-CM

## 2023-10-12 DIAGNOSIS — Z86718 Personal history of other venous thrombosis and embolism: Secondary | ICD-10-CM

## 2023-10-12 DIAGNOSIS — Z794 Long term (current) use of insulin: Secondary | ICD-10-CM

## 2023-10-12 DIAGNOSIS — Z8673 Personal history of transient ischemic attack (TIA), and cerebral infarction without residual deficits: Secondary | ICD-10-CM

## 2023-10-12 LAB — COMPREHENSIVE METABOLIC PANEL
ALT: 17 U/L (ref 0–44)
AST: 19 U/L (ref 15–41)
Albumin: 2 g/dL — ABNORMAL LOW (ref 3.5–5.0)
Alkaline Phosphatase: 98 U/L (ref 38–126)
Anion gap: 11 (ref 5–15)
BUN: 27 mg/dL — ABNORMAL HIGH (ref 6–20)
CO2: 22 mmol/L (ref 22–32)
Calcium: 7.6 mg/dL — ABNORMAL LOW (ref 8.9–10.3)
Chloride: 105 mmol/L (ref 98–111)
Creatinine, Ser: 7.12 mg/dL — ABNORMAL HIGH (ref 0.44–1.00)
GFR, Estimated: 7 mL/min — ABNORMAL LOW (ref >60)
Glucose, Bld: 166 mg/dL — ABNORMAL HIGH (ref 70–99)
Potassium: 4.1 mmol/L (ref 3.5–5.1)
Sodium: 138 mmol/L (ref 135–145)
Total Bilirubin: 0.7 mg/dL (ref 0.0–1.2)
Total Protein: 5.2 g/dL — ABNORMAL LOW (ref 6.5–8.1)

## 2023-10-12 LAB — CBC WITH DIFFERENTIAL/PLATELET
Abs Immature Granulocytes: 0.42 10*3/uL — ABNORMAL HIGH (ref 0.00–0.07)
Basophils Absolute: 0.1 10*3/uL (ref 0.0–0.1)
Basophils Relative: 0 %
Eosinophils Absolute: 2.4 10*3/uL — ABNORMAL HIGH (ref 0.0–0.5)
Eosinophils Relative: 10 %
HCT: 31 % — ABNORMAL LOW (ref 36.0–46.0)
Hemoglobin: 9.5 g/dL — ABNORMAL LOW (ref 12.0–15.0)
Immature Granulocytes: 2 %
Lymphocytes Relative: 5 %
Lymphs Abs: 1.2 10*3/uL (ref 0.7–4.0)
MCH: 29.3 pg (ref 26.0–34.0)
MCHC: 30.6 g/dL (ref 30.0–36.0)
MCV: 95.7 fL (ref 80.0–100.0)
Monocytes Absolute: 1.2 10*3/uL — ABNORMAL HIGH (ref 0.1–1.0)
Monocytes Relative: 5 %
Neutro Abs: 18.3 10*3/uL — ABNORMAL HIGH (ref 1.7–7.7)
Neutrophils Relative %: 78 %
Platelets: 180 10*3/uL (ref 150–400)
RBC: 3.24 MIL/uL — ABNORMAL LOW (ref 3.87–5.11)
RDW: 15.5 % (ref 11.5–15.5)
WBC: 23.6 10*3/uL — ABNORMAL HIGH (ref 4.0–10.5)
nRBC: 0 % (ref 0.0–0.2)

## 2023-10-12 LAB — CBG MONITORING, ED: Glucose-Capillary: 159 mg/dL — ABNORMAL HIGH (ref 70–99)

## 2023-10-12 LAB — TYPE AND SCREEN
ABO/RH(D): A POS
Antibody Screen: NEGATIVE

## 2023-10-12 LAB — I-STAT CG4 LACTIC ACID, ED: Lactic Acid, Venous: 1.1 mmol/L (ref 0.5–1.9)

## 2023-10-12 LAB — GLUCOSE, CAPILLARY: Glucose-Capillary: 130 mg/dL — ABNORMAL HIGH (ref 70–99)

## 2023-10-12 LAB — C-REACTIVE PROTEIN: CRP: 1.9 mg/dL — ABNORMAL HIGH (ref <1.0)

## 2023-10-12 LAB — HEMOGLOBIN AND HEMATOCRIT, BLOOD
HCT: 32.4 % — ABNORMAL LOW (ref 36.0–46.0)
Hemoglobin: 9.9 g/dL — ABNORMAL LOW (ref 12.0–15.0)

## 2023-10-12 LAB — LIPASE, BLOOD: Lipase: 22 U/L (ref 11–51)

## 2023-10-12 LAB — HCG, SERUM, QUALITATIVE: Preg, Serum: NEGATIVE

## 2023-10-12 MED ORDER — HYDRALAZINE HCL 50 MG PO TABS
100.0000 mg | ORAL_TABLET | Freq: Three times a day (TID) | ORAL | Status: DC
  Administered 2023-10-12 – 2023-10-14 (×2): 100 mg via ORAL
  Filled 2023-10-12 (×2): qty 2

## 2023-10-12 MED ORDER — FUROSEMIDE 40 MG PO TABS
80.0000 mg | ORAL_TABLET | ORAL | Status: DC
  Administered 2023-10-14: 80 mg via ORAL
  Filled 2023-10-12: qty 2

## 2023-10-12 MED ORDER — LORAZEPAM 2 MG/ML IJ SOLN
1.0000 mg | Freq: Once | INTRAMUSCULAR | Status: AC
  Administered 2023-10-12: 1 mg via INTRAVENOUS
  Filled 2023-10-12: qty 1

## 2023-10-12 MED ORDER — HYDROCERIN EX CREA
TOPICAL_CREAM | Freq: Two times a day (BID) | CUTANEOUS | Status: DC
Start: 2023-10-12 — End: 2023-10-14
  Filled 2023-10-12: qty 113

## 2023-10-12 MED ORDER — GABAPENTIN 100 MG PO CAPS
100.0000 mg | ORAL_CAPSULE | Freq: Every day | ORAL | Status: DC
  Administered 2023-10-12 – 2023-10-13 (×2): 100 mg via ORAL
  Filled 2023-10-12 (×2): qty 1

## 2023-10-12 MED ORDER — CHLORHEXIDINE GLUCONATE CLOTH 2 % EX PADS: 6.0000 | MEDICATED_PAD | Freq: Every day | CUTANEOUS | Status: DC

## 2023-10-12 MED ORDER — PANTOPRAZOLE SODIUM 40 MG IV SOLR
40.0000 mg | Freq: Two times a day (BID) | INTRAVENOUS | Status: DC
  Administered 2023-10-12 – 2023-10-14 (×5): 40 mg via INTRAVENOUS
  Filled 2023-10-12 (×5): qty 10

## 2023-10-12 MED ORDER — CARVEDILOL 12.5 MG PO TABS
12.5000 mg | ORAL_TABLET | Freq: Two times a day (BID) | ORAL | Status: DC
  Administered 2023-10-12 – 2023-10-14 (×3): 12.5 mg via ORAL
  Filled 2023-10-12 (×3): qty 1

## 2023-10-12 MED ORDER — ACETAMINOPHEN 650 MG RE SUPP: 650.0000 mg | Freq: Four times a day (QID) | RECTAL | Status: DC | PRN

## 2023-10-12 MED ORDER — DIPHENHYDRAMINE HCL 25 MG PO CAPS
25.0000 mg | ORAL_CAPSULE | Freq: Once | ORAL | Status: DC
  Filled 2023-10-12: qty 1

## 2023-10-12 MED ORDER — ISOSORBIDE MONONITRATE ER 30 MG PO TB24
30.0000 mg | ORAL_TABLET | Freq: Every day | ORAL | Status: DC
  Administered 2023-10-12 – 2023-10-14 (×3): 30 mg via ORAL
  Filled 2023-10-12 (×3): qty 1

## 2023-10-12 MED ORDER — INSULIN ASPART 100 UNIT/ML IJ SOLN
0.0000 [IU] | Freq: Three times a day (TID) | INTRAMUSCULAR | Status: DC
  Administered 2023-10-13: 1 [IU] via SUBCUTANEOUS

## 2023-10-12 MED ORDER — HYDROXYZINE HCL 25 MG PO TABS
50.0000 mg | ORAL_TABLET | Freq: Three times a day (TID) | ORAL | Status: DC | PRN
  Administered 2023-10-12 – 2023-10-14 (×3): 50 mg via ORAL
  Filled 2023-10-12 (×3): qty 2

## 2023-10-12 MED ORDER — SODIUM CHLORIDE 0.9 % IV SOLN
2.0000 g | Freq: Once | INTRAVENOUS | Status: AC
  Administered 2023-10-12: 2 g via INTRAVENOUS
  Filled 2023-10-12: qty 20

## 2023-10-12 MED ORDER — HYDROMORPHONE HCL 1 MG/ML IJ SOLN
0.5000 mg | Freq: Once | INTRAMUSCULAR | Status: AC
  Administered 2023-10-12: 0.5 mg via INTRAVENOUS
  Filled 2023-10-12: qty 1

## 2023-10-12 MED ORDER — FAMOTIDINE 20 MG PO TABS: 20.0000 mg | ORAL_TABLET | Freq: Once | ORAL | Status: DC

## 2023-10-12 MED ORDER — ALBUTEROL SULFATE (2.5 MG/3ML) 0.083% IN NEBU: 2.5000 mg | INHALATION_SOLUTION | Freq: Four times a day (QID) | RESPIRATORY_TRACT | Status: DC | PRN

## 2023-10-12 MED ORDER — SODIUM CHLORIDE 0.9% FLUSH
3.0000 mL | Freq: Two times a day (BID) | INTRAVENOUS | Status: DC
  Administered 2023-10-12 – 2023-10-14 (×5): 3 mL via INTRAVENOUS

## 2023-10-12 MED ORDER — ROSUVASTATIN CALCIUM 5 MG PO TABS
10.0000 mg | ORAL_TABLET | Freq: Every day | ORAL | Status: DC
  Administered 2023-10-12 – 2023-10-14 (×3): 10 mg via ORAL
  Filled 2023-10-12 (×2): qty 1
  Filled 2023-10-12: qty 2

## 2023-10-12 MED ORDER — ACETAMINOPHEN 325 MG PO TABS
650.0000 mg | ORAL_TABLET | Freq: Four times a day (QID) | ORAL | Status: DC | PRN
Start: 2023-10-12 — End: 2023-10-14

## 2023-10-12 MED ORDER — MORPHINE SULFATE (PF) 4 MG/ML IV SOLN
4.0000 mg | Freq: Once | INTRAVENOUS | Status: DC
Start: 2023-10-12 — End: 2023-10-12

## 2023-10-12 MED ORDER — ONDANSETRON HCL 4 MG/2ML IJ SOLN: 4.0000 mg | Freq: Once | INTRAMUSCULAR | Status: DC

## 2023-10-12 NOTE — H&P (Signed)
 History and Physical    Patient: Sherri Dixon FMW:[id number] DOB: 1981-01-03 DOA: 10/12/2023 DOS: the patient was seen and examined on 10/12/2023 PCP: Arby Lyle LABOR, NP  Patient coming from: Home  Chief Complaint:  Chief Complaint  Patient presents with   Emesis   HPI: Sherri Dixon is a 43 y.o. 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  Patient had just been hospitalized from 09/27/2023 to 10/06/2023 with volume overload and recently started on hemodialysis 10/03/23.  Patient was noted to have an abnormal urinalysis during her hospitalization for which she was treated with 5 days of Keflex .  Patient had previously been on Xarelto  for treatment, but was switched weakness hospitalization to Eliquis  due to end-stage renal disease. She reported experiencing severe pelvic cramps during dialysis sessions. She also noted vaginal bleeding. The patient vomited blood during her most recent dialysis session today.  Denies this happening previously past.  She reports having lower stomach pain and needs, but only during dialysis.   The patient also reported leg swelling. She has a small skin lesion that has been itchy and red, which she has been treating with Aquaphor and Gold Bond Eczema Relief. She also reported generalized itching, which has been disrupting her sleep. She has tried Benadryl  and various lotions for relief.  In addition to these symptoms, the patient reported vaginal discomfort during bleeding episodes, along with a recent dark, foul-smelling discharge. She also experienced burning during urination. The patient's last menstrual cycle was on the 20th of the previous month.  Denies having any blood present in her stools.  In the emergency department patient was noted to be afebrile with blood  pressures elevated up to 186/80, and all other vital signs maintained.  Labs significant for WBC 23.5, hemoglobin 9.5, BUN 27, creatinine 7.12, calcium  7.6,  and lactic acid 1.1.CT scan of the abdomen pelvis noted no acute intra-abdominal pelvic pathology and noted partially visualized pericardial effusion.  EKG reveals sinus rhythm at 78 bpm with prolonged QT interval.  Patient had been given Ativan  milligram IV, Dilaudid  0.5 mg IV, and Rocephin  due to concern for bacteremia.  In-N-Out cath had been attempted but did not produce any urine.    Review of Systems: As mentioned in the history of present illness. All other systems reviewed and are negative. Past Medical History:  Diagnosis Date   Anemia    Asthma    CHF (congestive heart failure) (HCC)    Pt reports that father has CHF, not her   Diabetes mellitus without complication (HCC)    Dyslipidemia    GERD (gastroesophageal reflux disease)    Pulmonary embolism (HCC)    Stroke [facility])    Past Surgical History:  Procedure Laterality Date   APPLICATION OF WOUND VAC Right 04/12/2021   Procedure: APPLICATION OF WOUND VAC;  Surgeon: Elsa Lonni SAUNDERS, MD;  Location: [facility] OR;  Service: Orthopedics;  Laterality: Right;   DIALYSIS/PERMA CATHETER INSERTION N/A 10/02/2023   Procedure: DIALYSIS/PERMA CATHETER INSERTION;  Surgeon: Pearline Norman RAMAN, MD;  Location: [facility] INVASIVE CV LAB;  Service: Cardiovascular;  Laterality: N/A;   I & D EXTREMITY Right 04/06/2021   Procedure: IRRIGATION AND DEBRIDEMENT TOE WITH WOUND VAC PLACEMENT;  Surgeon: Elsa Lonni SAUNDERS, MD;  Location: [facility] OR;  Service: Orthopedics;  Laterality: Right;   I & D EXTREMITY Right 04/12/2021  Procedure: IRRIGATION AND DEBRIDEMENT RIGHT FOOT;  Surgeon: Elsa Lonni SAUNDERS, MD;  Location: [facility] OR;  Service: Orthopedics;  Laterality: Right;   IR FLUORO GUIDE CV LINE RIGHT  10/01/2023   IR FLUORO GUIDE CV LINE RIGHT  10/01/2023   IR FLUORO GUIDE CV LINE RIGHT  10/02/2023   IR US  GUIDE VASC  ACCESS RIGHT  10/01/2023   MOUTH SURGERY     ROBOTIC ASSITED PARTIAL NEPHRECTOMY Right 07/28/2019   Procedure: XI ROBOTIC ASSITED PARTIAL NEPHRECTOMY;  Surgeon: Sherrilee Belvie CROME, MD;  Location: [facility] ORS;  Service: Urology;  Laterality: Right;  3 HRS   SECONDARY CLOSURE OF WOUND Right 06/18/2021   Procedure: SECONDARY CLOSURE OF WOUND tRANSMETATARSAL AMPUTAION;  Surgeon: Elsa Lonni SAUNDERS, MD;  Location: [facility] OR;  Service: Orthopedics;  Laterality: Right;   TRANSMETATARSAL AMPUTATION Right 04/12/2021   Procedure: TRANSMETATARSAL AMPUTATION;  Surgeon: Elsa Lonni SAUNDERS, MD;  Location: [facility] OR;  Service: Orthopedics;  Laterality: Right;   Social History:  reports that she has never smoked. She has never used smokeless tobacco. She reports current alcohol  use of about 1.0 standard drink of alcohol  per week. She reports that she does not use drugs.  Allergies  Allergen Reactions   Amlodipine Swelling    Pelvis swells   Insulin  Glargine Other (See Comments) and Hives   Humalog  Kwikpen [Insulin  Lispro] Other (See Comments)    75/50---swelling and ulcers in mouth     Family History  Problem Relation Age of Onset   High blood pressure Mother    Diabetes Mother    Arthritis Mother    Hyperlipidemia Mother    Heart disease Mother    High blood pressure Father    Hyperlipidemia Father    Dementia Father    High blood pressure Sister    Diabetes Brother    Hyperlipidemia Brother    High blood pressure Brother    Colon cancer Neg Hx    Stomach cancer Neg Hx    Esophageal cancer Neg Hx    Pancreatic cancer Neg Hx    Liver disease Neg Hx     Prior to Admission medications   Medication Sig Start Date End Date Taking? Authorizing Provider  Accu-Chek Softclix Lancets lancets Use as directed in the morning, at noon, and at bedtime. 10/06/23 11/05/23  Dennise Lavada POUR, MD  apixaban  (ELIQUIS ) 5 MG TABS tablet Take 1 tablet (5 mg total) by mouth 2 (two) times daily. 10/06/23   Singh, Prashant K, MD   Blood Glucose Monitoring Suppl (BLOOD GLUCOSE MONITOR SYSTEM) w/Device KIT Use as directed in the morning, at noon, and at bedtime. 10/06/23   Singh, Prashant K, MD  carvedilol  (COREG ) 12.5 MG tablet Take 1 tablet (12.5 mg total) by mouth 2 (two) times daily with a meal. 10/06/23   Dennise Lavada POUR, MD  cephALEXin  (KEFLEX ) 500 MG capsule Take 1 capsule (500 mg total) by mouth daily. 10/06/23   Singh, Prashant K, MD  Continuous Blood Gluc Sensor (DEXCOM G6 SENSOR) MISC by Does not apply route. Patient not taking: Reported on 11/13/2022 08/13/22   [provider]  furosemide  (LASIX ) 80 MG tablet Take 1 tablet (80 mg total) by mouth daily on your nondialysis days which will be Wednesday, Saturday and Sunday 10/06/23   Singh, Prashant K, MD  Glucose Blood (BLOOD GLUCOSE TEST STRIPS) STRP Use as directed in the morning, at noon, and at bedtime. 10/06/23 11/08/23  Singh, Prashant K, MD  glucose blood test strip Use as instructed 06/06/16  Dhungel, Nishant, MD  hydrALAZINE  (APRESOLINE ) 100 MG tablet Take 1 tablet (100 mg total) by mouth every 8 (eight) hours. 10/06/23   Singh, Prashant K, MD  insulin  aspart (NOVOLOG ) 100 UNIT/ML FlexPen Use as directed before each meal 3 times a day, 140-199 - 2 units, 200-250 - 4 units, 251-299 - 6 units,  300-349 - 8 units,  350 or above 10 units. 10/06/23   Singh, Prashant K, MD  Insulin  Pen Needle 32G X 4 MM MISC Use as directed 4 times a day for subcutaneous insulin  injection. 10/06/23   Dennise Lavada POUR, MD  Insulin  Syringe-Needle U-100 (INSULIN  SYRINGE 1CC/28G) 28G X 1/2 1 ML MISC 1 application by Does not apply route 3 (three) times daily before meals. 06/06/16   Dhungel, Nishant, MD  isosorbide  mononitrate (IMDUR ) 30 MG 24 hr tablet Take by mouth. 08/05/22   [provider]  Lancet Device MISC 1 each by Does not apply route in the morning, at noon, and at bedtime. May substitute to any manufacturer covered by patient's insurance. 10/06/23 11/05/23  Singh, Prashant K, MD   MULTIPLE VITAMINS PO Take by mouth.    [provider]  nitroGLYCERIN  (NITROSTAT ) 0.4 MG SL tablet Take by mouth. 04/17/19   [provider]  omeprazole  (PRILOSEC) 20 MG capsule TAKE 1 CAPSULE (20 MG TOTAL) BY MOUTH 2 (TWO) TIMES DAILY BEFORE A MEAL. 04/14/23   Hope Almarie ORN, NP  rosuvastatin  (CRESTOR ) 40 MG tablet Take 1 tablet (40 mg total) by mouth daily. 01/29/21   Singh, Prashant K, MD  Vitamin D , Ergocalciferol , (DRISDOL ) 1.25 MG (50000 UNIT) CAPS capsule Take 50,000 Units by mouth once a week. 03/19/21   [provider]    Physical Exam: Vitals:   10/12/23 0900 10/12/23 0904 10/12/23 1045 10/12/23 1226  BP: 123/62  (!) 186/80 (!) 170/72  Pulse: 79  79 80  Resp: 15  15 14   Temp:  (!) 97.4 F (36.3 C)  97.6 F (36.4 C)  TempSrc:  Oral  Oral  SpO2: 100%  100% 100%    Constitutional: Middle-age female who appears chronically ill, but in no acute distress Eyes: PERRL, lids and conjunctivae normal ENMT: Mucous membranes are moist.  Normal dentition.  Neck: normal, supple, no masses, no thyromegaly Respiratory: clear to auscultation bilaterally, no wheezing, no crackles. Normal respiratory effort. No accessory muscle use.  Cardiovascular: Regular rate and rhythm, no murmurs / rubs / gallops. No extremity edema. 2+ pedal pulses. No carotid bruits.  Abdomen: no tenderness, no masses palpated.   Bowel sounds positive.  Musculoskeletal: no clubbing / cyanosis.  Transmetatarsal amputation of the right foot. Skin: Wound with mild surrounding erythema on the right lower extremity as seen below neurologic: CN 2-12 grossly intact.   Strength 5/5 in all 4.  Psychiatric: Normal judgment and insight. Alert and oriented x 3. Normal mood.   Data Reviewed:  reviewed labs, imaging, and pertinent records as documented.  Assessment and Plan:  Leukocytosis Possible urinary tract infection Acute.  WBC elevated at 23.6 which is acutely higher than prior 12.8 on 1/7.   Question the possibility of urinary tract infection as patient had previous been treated for this during last hospitalization.  Blood cultures have been ordered.  Patient had been started on empiric antibiotics of Rocephin  IV.  Questioned possibility of urinary tract infection versus possible bacteremia as cause of patient's symptoms. -Admit to a MedSurg bed -Follow-up blood cultures -Follow-up GC swab -Follow-up urinalysis and culture if able to be  obtained -Continue empiric antibiotic of rocephin  IV.  Adjust antibiotics as needed  End-stage renal disease on hemodialysis Patient just recently started hemodialysis on 10/03/23.  Labs today note potassium 4.1, BUN 27, and creatinine 7.12.  She had gone to hemodialysis this morning, but did not have a complete session due to nausea and vomiting -Nephrology consulted for need of hemodialysis  Hematemesis with nausea Patient reports having nausea and vomiting with reports of blood present in emesis.  Suspect secondary to gastritis and not an overt upper GI bleed at this time -Type and screen for possible need of blood products -Serial monitoring of H&H -Consider need of formal consult to gastroenterology if hemoglobin appears to be trending down.  Vaginal bleeding Patient reports having vaginal bleeding for the last couple of days with abdominal cramping.  CT scan of the abdomen pelvis did not note any acute concerning abnormalities.  Patient underwent speculum exam that did not note any -Recommend outpatient follow-up with OB/GYN if symptoms persist  Anemia of chronic disease Hemoglobin 9.5 which appears higher than previous.  Noted to have reports of vaginal bleeding and vomiting blood. -Recheck H&H -Held Eliquis   Essential hypertension Blood pressures initially up to 190/87. -Resume previous blood pressure regimen  Pericardial effusion Prior to admission.  noted during last hospitalization repeat echocardiogram from 10/06/2023 noted minimal  effusion increased which outpatient follow-up with cardiology had been recommended.  Pruritus Patient reports having generalized itching all over.  Thought  secondary to renal dysfunction. -Hydroxyzine  p.o. as needed -Continue Eucerin cream  Diabetes mellitus type 2, with long-term use of insulin  On admission glucose was 66.  Last hemoglobin A1c was 6.1 when checked on 12/30/4. -Hypoglycemic protocols -CBGs before every meal with very sensitive SSI  -Adjust insulin  regimen as needed . History of DVT/PE anticoagulation Patient last dose of Eliquis  was yesterday evening.  Patient had been switched from Xarelto  to Eliquis  during last hospitalization due to end-stage renal disease. -Resume Eliquis  if no signs of bleeding.  History of CVA -Continue statin  Obesity BMI 37.02 kg/m  DTV prophylaxis: SCD Advance Care Planning:   Code Status: Full Code   Consults: Nephrology  Family Communication: Family updated at bedside  Severity of Illness: The appropriate patient status for this patient is INPATIENT. Inpatient status is judged to be reasonable and necessary in order to provide the required intensity of service to ensure the patient's safety. The patient's presenting symptoms, physical exam findings, and initial radiographic and laboratory data in the context of their chronic comorbidities is felt to place them at high risk for further clinical deterioration. Furthermore, it is not anticipated that the patient will be medically stable for discharge from the hospital within 2 midnights of admission.   * I certify that at the point of admission it is my clinical judgment that the patient will require inpatient hospital care spanning beyond 2 midnights from the point of admission due to high intensity of service, high risk for further deterioration and high frequency of surveillance required.*  Author: Maximino DELENA Sharps, MD 10/12/2023 1:20 PM  For on call review [email or web address].

## 2023-10-12 NOTE — Progress Notes (Addendum)
 VAST consulted to draw blood cultures from hemodialysis catheter. Per best practice, nephrologist was contacted for specific order. Marisue Humble, MD (nephrology) stated not to draw cultures from [facility]. ER PA notified.

## 2023-10-12 NOTE — Plan of Care (Signed)

## 2023-10-12 NOTE — ED Provider Notes (Signed)
 [facility] Provider Note   CSN: [id number] Arrival date & time: 10/12/23  9145     History  Chief Complaint  Patient presents with   Emesis    Sherri Dixon is a 43 y.o. female recently placed on hemodialysis at the beginning of this month who dialyzes Monday Tuesday Thursday Friday, history of CHF, PE, stroke on Eliquis , diabetes presented for abdominal pain that began this morning.  Patient states that she missed her dialysis today and normally take Zofran  but forgot to take her Zofran  this morning.  Patient states that is normal for nausea vomiting before dialysis.  Patient denies chest pain shortness of breath but is unsure of dysuria as she still makes urine.  Home Medications Prior to Admission medications   Medication Sig Start Date End Date Taking? Authorizing Provider  Accu-Chek Softclix Lancets lancets Use as directed in the morning, at noon, and at bedtime. 10/06/23 11/05/23  Dennise Lavada POUR, MD  apixaban  (ELIQUIS ) 5 MG TABS tablet Take 1 tablet (5 mg total) by mouth 2 (two) times daily. 10/06/23   Singh, Prashant K, MD  Blood Glucose Monitoring Suppl (BLOOD GLUCOSE MONITOR SYSTEM) w/Device KIT Use as directed in the morning, at noon, and at bedtime. 10/06/23   Singh, Prashant K, MD  carvedilol  (COREG ) 12.5 MG tablet Take 1 tablet (12.5 mg total) by mouth 2 (two) times daily with a meal. 10/06/23   Dennise Lavada POUR, MD  cephALEXin  (KEFLEX ) 500 MG capsule Take 1 capsule (500 mg total) by mouth daily. 10/06/23   Singh, Prashant K, MD  Continuous Blood Gluc Sensor (DEXCOM G6 SENSOR) MISC by Does not apply route. Patient not taking: Reported on 11/13/2022 08/13/22   [provider]  furosemide  (LASIX ) 80 MG tablet Take 1 tablet (80 mg total) by mouth daily on your nondialysis days which will be Wednesday, Saturday and Sunday 10/06/23   Singh, Prashant K, MD  Glucose Blood (BLOOD GLUCOSE TEST STRIPS) STRP Use as directed in the morning, at  noon, and at bedtime. 10/06/23 11/08/23  Singh, Prashant K, MD  glucose blood test strip Use as instructed 06/06/16   Dhungel, Nishant, MD  hydrALAZINE  (APRESOLINE ) 100 MG tablet Take 1 tablet (100 mg total) by mouth every 8 (eight) hours. 10/06/23   Singh, Prashant K, MD  insulin  aspart (NOVOLOG ) 100 UNIT/ML FlexPen Use as directed before each meal 3 times a day, 140-199 - 2 units, 200-250 - 4 units, 251-299 - 6 units,  300-349 - 8 units,  350 or above 10 units. 10/06/23   Singh, Prashant K, MD  Insulin  Pen Needle 32G X 4 MM MISC Use as directed 4 times a day for subcutaneous insulin  injection. 10/06/23   Dennise Lavada POUR, MD  Insulin  Syringe-Needle U-100 (INSULIN  SYRINGE 1CC/28G) 28G X 1/2 1 ML MISC 1 application by Does not apply route 3 (three) times daily before meals. 06/06/16   Dhungel, Nishant, MD  isosorbide  mononitrate (IMDUR ) 30 MG 24 hr tablet Take by mouth. 08/05/22   [provider]  Lancet Device MISC 1 each by Does not apply route in the morning, at noon, and at bedtime. May substitute to any manufacturer covered by patient's insurance. 10/06/23 11/05/23  Singh, Prashant K, MD  MULTIPLE VITAMINS PO Take by mouth.    [provider]  nitroGLYCERIN  (NITROSTAT ) 0.4 MG SL tablet Take by mouth. 04/17/19   [provider]  omeprazole  (PRILOSEC) 20 MG capsule TAKE 1 CAPSULE (20 MG TOTAL) BY  MOUTH 2 (TWO) TIMES DAILY BEFORE A MEAL. 04/14/23   Hope Almarie ORN, NP  rosuvastatin  (CRESTOR ) 40 MG tablet Take 1 tablet (40 mg total) by mouth daily. 01/29/21   Singh, Prashant K, MD  Vitamin D , Ergocalciferol , (DRISDOL ) 1.25 MG (50000 UNIT) CAPS capsule Take 50,000 Units by mouth once a week. 03/19/21   [provider]      Allergies    Amlodipine, Insulin  glargine, and Humalog  kwikpen [insulin  lispro]    Review of Systems   Review of Systems  Gastrointestinal:  Positive for vomiting.    Physical Exam Updated Vital Signs BP (!) 170/72   Pulse 80   Temp 97.6 F (36.4 C)  (Oral)   Resp 14   LMP 09/25/2023   SpO2 100%  Physical Exam Vitals reviewed.  Constitutional:      General: She is not in acute distress.    Comments: Uncomfortable  HENT:     Head: Normocephalic and atraumatic.  Eyes:     Extraocular Movements: Extraocular movements intact.     Conjunctiva/sclera: Conjunctivae normal.     Pupils: Pupils are equal, round, and reactive to light.  Cardiovascular:     Rate and Rhythm: Normal rate and regular rhythm.     Pulses: Normal pulses.     Heart sounds: Normal heart sounds.     Comments: 2+ bilateral radial/dorsalis pedis pulses with regular rate Pulmonary:     Effort: Pulmonary effort is normal. No respiratory distress.     Breath sounds: Normal breath sounds.  Abdominal:     Palpations: Abdomen is soft.     Tenderness: There is no abdominal tenderness. There is no guarding or rebound.  Musculoskeletal:        General: Normal range of motion.     Cervical back: Normal range of motion and neck supple.     Comments: 5 out of 5 bilateral grip/leg extension strength  Skin:    General: Skin is warm and dry.     Capillary Refill: Capillary refill takes less than 2 seconds.  Neurological:     General: No focal deficit present.     Mental Status: She is alert and oriented to person, place, and time.     Comments: Sensation intact in all 4 limbs  Psychiatric:        Mood and Affect: Mood normal.     ED Results / Procedures / Treatments   Labs (all labs ordered are listed, but only abnormal results are displayed) Labs Reviewed  COMPREHENSIVE METABOLIC PANEL - Abnormal; Notable for the following components:      Result Value   Glucose, Bld 166 (*)    BUN 27 (*)    Creatinine, Ser 7.12 (*)    Calcium  7.6 (*)    Total Protein 5.2 (*)    Albumin  2.0 (*)    GFR, Estimated 7 (*)    All other components within normal limits  CBC WITH DIFFERENTIAL/PLATELET - Abnormal; Notable for the following components:   WBC 23.6 (*)    RBC 3.24 (*)     Hemoglobin 9.5 (*)    HCT 31.0 (*)    Neutro Abs 18.3 (*)    Monocytes Absolute 1.2 (*)    Eosinophils Absolute 2.4 (*)    Abs Immature Granulocytes 0.42 (*)    All other components within normal limits  CBG MONITORING, ED - Abnormal; Notable for the following components:   Glucose-Capillary 159 (*)    All other components within normal limits  CULTURE, BLOOD (ROUTINE X 2)  CULTURE, BLOOD (ROUTINE X 2)  LIPASE, BLOOD  CBC WITH DIFFERENTIAL/PLATELET  URINALYSIS, ROUTINE W REFLEX MICROSCOPIC  HCG, SERUM, QUALITATIVE  C-REACTIVE PROTEIN  I-STAT CG4 LACTIC ACID, ED  I-STAT CG4 LACTIC ACID, ED    EKG None  Radiology CT ABDOMEN PELVIS WO CONTRAST Result Date: 10/12/2023 CLINICAL DATA:  Abdominal pain and vomiting. Renal cancer status post partial nephrectomy. EXAM: CT ABDOMEN AND PELVIS WITHOUT CONTRAST TECHNIQUE: Multidetector CT imaging of the abdomen and pelvis was performed following the standard protocol without IV contrast. RADIATION DOSE REDUCTION: This exam was performed according to the departmental dose-optimization program which includes automated exposure control, adjustment of the mA and/or kV according to patient size and/or use of iterative reconstruction technique. COMPARISON:  CT abdomen pelvis dated 09/27/2023. FINDINGS: Evaluation of this exam is limited in the absence of intravenous contrast. Lower chest: The visualized lung bases are clear. There is calcification of the mitral annulus. Right femoral venous catheter with tip in the right atrium. Partially visualized pericardial effusion measuring 12 mm in thickness. No intra-abdominal free air or free fluid. Hepatobiliary: The liver is unremarkable. No biliary ductal dilatation. The gallbladder is unremarkable. Pancreas: The pancreas is unremarkable as visualized. Spleen: Normal in size without focal abnormality. Adrenals/Urinary Tract: The adrenal glands unremarkable. Postsurgical changes of the upper pole of the right  kidney. Small bilateral vascular calcifications. Faint 1 cm hypodense lesion in the upper pole of the left kidney is suboptimally characterized but similar to prior CT, likely a cyst. No hydronephrosis or obstructing stone. The visualized ureters and urinary bladder appear unremarkable. Stomach/Bowel: Thickened appearance of the rectum may be related to underdistention. There is no bowel obstruction or active inflammation. The appendix is normal. Vascular/Lymphatic: Mild aortoiliac atherosclerotic disease. The IVC is unremarkable. No portal venous gas. Small retroperitoneal and periaortic lymph nodes. No adenopathy. Reproductive: The uterus is anteverted. No suspicious adnexal masses. Other: Small fat containing right lower ventral hernia. No bowel herniation or fluid collection. Mild diffuse subcutaneous edema. Musculoskeletal: No acute osseous pathology. IMPRESSION: 1. No acute intra-abdominal or pelvic pathology. 2. Postsurgical changes of the upper pole of the right kidney. 3. Partially visualized pericardial effusion. 4.  Aortic Atherosclerosis (ICD10-I70.0). Electronically Signed   By: Vanetta Chou M.D.   On: 10/12/2023 12:36    Procedures Procedures    Medications Ordered in ED Medications  cefTRIAXone  (ROCEPHIN ) 2 g in sodium chloride  0.9 % 100 mL IVPB (has no administration in time range)  LORazepam  (ATIVAN ) injection 1 mg (1 mg Intravenous Given 10/12/23 0926)  HYDROmorphone  (DILAUDID ) injection 0.5 mg (0.5 mg Intravenous Given 10/12/23 1244)    ED Course/ Medical Decision Making/ A&P                                 Medical Decision Making Amount and/or Complexity of Data Reviewed Labs: ordered. Radiology: ordered.  Risk Prescription drug management. Decision regarding hospitalization.   Kaiyla Stahly 43 y.o. presented today for abdominal pain.  Working DDx that I considered at this time includes, but not limited to, gastroenteritis, colitis, small bowel obstruction,  appendicitis, cholecystitis, hepatobiliary pathology, gastritis, PUD, ACS, aortic dissection, diverticulosis/diverticulitis, pancreatitis, nephrolithiasis, medication induced, AAA, UTI, pyelonephritis, ruptured ectopic pregnancy, PID, ovarian torsion.  R/o DDx: gastroenteritis, colitis, small bowel obstruction, appendicitis, cholecystitis, hepatobiliary pathology, gastritis, PUD, ACS, aortic dissection, diverticulosis/diverticulitis, pancreatitis, nephrolithiasis, medication induced, AAA, ruptured ectopic pregnancy, PID, ovarian torsion: These are considered less  likely due to history of present illness, physical exam, labs/imaging findings.  Review of prior external notes: 10/06/2023 discharge summary  Unique Tests and My Interpretation:  CBC with differential: Leukocytosis 23.6 CMP: Improved from baseline Lipase: Unremarkable UA: Pending hCG serum qualitative: Pending EKG: Rate, rhythm, axis, intervals all examined and without medically relevant abnormality. ST segments without concerns for elevations CT Abd/Pelvis without contrast: No acute findings Chest x-ray: Pending  Social Determinants of Health: none  Discussion with Independent Historian: None  Discussion of Management of Tests:  Marlee, MD Nephrology; Claudene, MD hospitalist  Risk: High: hospitalization or escalation of hospital-level care  Risk Stratification Score: None  Staffed with Mannie, DO  Plan: On exam patient was no acute distress with stable vitals however did appear uncomfortable on exam.  Patient did not have any abdominal tenderness peritoneal signs on exam.  EKG does show prolonged QT so we will give Ativan  instead of Zofran  for her nausea.  Will obtain abdominal labs along with urine as patient still makes urine.  Patient stable at this time.  Patient did improve with Ativan  however does have high white count so CT abdomen pelvis was ordered without contrast that ultimately did not have any acute findings.   The rest of patient's labs are ultimately reassuring around her baseline however still waiting on UA.  After discussion with the attending we agreed to get blood cultures as patient does have very high white count that is higher than expected if this was reactive and to start Rocephin  and admit to the hospitalist.  Will also consult nephrology as patient will need dialysis as well.  Patient stable to be admitted.  I spoke to the nephrologist on-call and he states that he will see the patient after admission.  Hospitalist consulted.  When patient went to the bathroom nurse reported a bunch of blood came out of her vagina and patient states that she has had repeat infections in her vaginal area due to repeat caths.  Attending stated that he would do a pelvic exam on her to further assess but does not want pelvic ultrasound at this time.  I spoke to hospitalist and to get the patient admitted but will update the hospitalist on the pelvic exam findings.  Attending do not see any significant findings on his pelvic exam but did get GC swab.  Hospitalist was updated of these findings.  Patient stable to be admitted.  This chart was dictated using voice recognition software.  Despite best efforts to proofread,  errors can occur which can change the documentation meaning.         Final Clinical Impression(s) / ED Diagnoses Final diagnoses:  Nausea and vomiting, unspecified vomiting type    Rx / DC Orders ED Discharge Orders     None         Victor Lynwood ONEIDA DEVONNA 10/12/23 1348    Mannie Pac T, DO 10/13/23 1832

## 2023-10-12 NOTE — Plan of Care (Signed)
  Problem: Education: Goal: Knowledge of disease and its progression will improve 10/12/2023 1837 by Shela Lapine, RN Outcome: Progressing 10/12/2023 1816 by Shela Lapine, RN Outcome: Progressing Goal: Individualized Educational Video(s) 10/12/2023 1837 by Shela Lapine, RN Outcome: Progressing 10/12/2023 1816 by Shela Lapine, RN Outcome: Progressing   Problem: Fluid Volume: Goal: Compliance with measures to maintain balanced fluid volume will improve 10/12/2023 1837 by Shela Lapine, RN Outcome: Progressing 10/12/2023 1816 by Shela Lapine, RN Outcome: Progressing   Problem: Health Behavior/Discharge Planning: Goal: Ability to manage health-related needs will improve 10/12/2023 1837 by Shela Lapine, RN Outcome: Progressing 10/12/2023 1816 by Shela Lapine, RN Outcome: Progressing   Problem: Nutritional: Goal: Ability to make healthy dietary choices will improve 10/12/2023 1837 by Shela Lapine, RN Outcome: Progressing 10/12/2023 1816 by Shela Lapine, RN Outcome: Progressing   Problem: Clinical Measurements: Goal: Complications related to the disease process, condition or treatment will be avoided or minimized 10/12/2023 1837 by Shela Lapine, RN Outcome: Progressing 10/12/2023 1816 by Shela Lapine, RN Outcome: Progressing

## 2023-10-12 NOTE — ED Notes (Signed)
 I called dialysis and asked them if they could get blood cultures off of pt's dialysis catheter and they stated they no longer do blood cultures and to ask IV team

## 2023-10-12 NOTE — Progress Notes (Signed)
 New Admission Note:   Arrival Method: w/c Mental Orientation: aa+ox4 Telemetry: SR Assessment: Completed Skin: scratches all over torso, legs and arms, open area to LLE IV: SL Pain: denies Tubes: n/a Safety Measures: Safety Fall Prevention Plan has been given, discussed and signed Admission: Completed [facility] Orientation: Patient has been orientated to the room, unit and staff.  Family:  present  Orders have been reviewed and implemented. Will continue to monitor the patient. Call light has been placed within reach and bed alarm has been activated.   Doyal Sias, RN

## 2023-10-12 NOTE — Consult Note (Signed)
 [facility] Renal Consultation Note    Indication for Consultation:  Management of ESRD/hemodialysis; anemia, hypertension/volume and secondary hyperparathyroidism   HPI: Sherri Dixon is a 43 y.o. female with newly diagnosed ESRD on HD, hypertension, chronic HFpEF 60-65%, h/o pericardial effusion, h/o DVT/PE on Eliquis . She was recently discharged from [facility] 10/06/2023 with progressive CKD requiring HD.  Notable volume overload and anasarca during admission. She was CLIP'd to TCU with first treatment on 10/08/23. She has been unable to tolerate a full dialysis treatment since she was discharged. This am she presented for treatment and had an episode hypotension with vomiting shortly after starting treatment. TCU RN reports similar episode with each outpatient treatment so far.  VS on arrival: BP 106/48, O2 99% on RA. CXR with vascular congestion. Labs. Na 138, K 4.1, BUN 27 Cr 7.12, Alb 2.0, WBC 23.6, Hgb 9.5  Seen and examined in ED. BP back up to 190/87. Comfortable on RA. Reports chronic pruritus, skin excoriated from itching. No worsening sob, cp  Past Medical History:  Diagnosis Date   Anemia    Asthma    CHF (congestive heart failure) (HCC)    Pt reports that father has CHF, not her   Diabetes mellitus without complication (HCC)    Dyslipidemia    GERD (gastroesophageal reflux disease)    Pulmonary embolism (HCC)    Stroke [facility])    Past Surgical History:  Procedure Laterality Date   APPLICATION OF WOUND VAC Right 04/12/2021   Procedure: APPLICATION OF WOUND VAC;  Surgeon: Elsa Lonni SAUNDERS, MD;  Location: [facility] OR;  Service: Orthopedics;  Laterality: Right;   DIALYSIS/PERMA CATHETER INSERTION N/A 10/02/2023   Procedure: DIALYSIS/PERMA CATHETER INSERTION;  Surgeon: Pearline Norman RAMAN, MD;  Location: [facility] INVASIVE CV LAB;  Service: Cardiovascular;  Laterality: N/A;   I & D EXTREMITY Right 04/06/2021   Procedure: IRRIGATION AND DEBRIDEMENT TOE WITH WOUND VAC PLACEMENT;  Surgeon:  Elsa Lonni SAUNDERS, MD;  Location: [facility] OR;  Service: Orthopedics;  Laterality: Right;   I & D EXTREMITY Right 04/12/2021   Procedure: IRRIGATION AND DEBRIDEMENT RIGHT FOOT;  Surgeon: Elsa Lonni SAUNDERS, MD;  Location: [facility] OR;  Service: Orthopedics;  Laterality: Right;   IR FLUORO GUIDE CV LINE RIGHT  10/01/2023   IR FLUORO GUIDE CV LINE RIGHT  10/01/2023   IR FLUORO GUIDE CV LINE RIGHT  10/02/2023   IR US  GUIDE VASC ACCESS RIGHT  10/01/2023   MOUTH SURGERY     ROBOTIC ASSITED PARTIAL NEPHRECTOMY Right 07/28/2019   Procedure: XI ROBOTIC ASSITED PARTIAL NEPHRECTOMY;  Surgeon: Sherrilee Belvie CROME, MD;  Location: [facility] ORS;  Service: Urology;  Laterality: Right;  3 HRS   SECONDARY CLOSURE OF WOUND Right 06/18/2021   Procedure: SECONDARY CLOSURE OF WOUND tRANSMETATARSAL AMPUTAION;  Surgeon: Elsa Lonni SAUNDERS, MD;  Location: [facility] OR;  Service: Orthopedics;  Laterality: Right;   TRANSMETATARSAL AMPUTATION Right 04/12/2021   Procedure: TRANSMETATARSAL AMPUTATION;  Surgeon: Elsa Lonni SAUNDERS, MD;  Location: [facility] OR;  Service: Orthopedics;  Laterality: Right;   Family History  Problem Relation Age of Onset   High blood pressure Mother    Diabetes Mother    Arthritis Mother    Hyperlipidemia Mother    Heart disease Mother    High blood pressure Father    Hyperlipidemia Father    Dementia Father    High blood pressure Sister    Diabetes Brother    Hyperlipidemia Brother    High blood pressure Brother    Colon cancer  Neg Hx    Stomach cancer Neg Hx    Esophageal cancer Neg Hx    Pancreatic cancer Neg Hx    Liver disease Neg Hx    Social History:  reports that she has never smoked. She has never used smokeless tobacco. She reports current alcohol  use of about 1.0 standard drink of alcohol  per week. She reports that she does not use drugs. Allergies  Allergen Reactions   Amlodipine Swelling    Pelvis swells   Insulin  Glargine Other (See Comments) and Hives   Humalog  Kwikpen [Insulin  Lispro] Other  (See Comments)    75/50---swelling and ulcers in mouth    Prior to Admission medications   Medication Sig Start Date End Date Taking? Authorizing Provider  Accu-Chek Softclix Lancets lancets Use as directed in the morning, at noon, and at bedtime. 10/06/23 11/05/23  Dennise Lavada POUR, MD  apixaban  (ELIQUIS ) 5 MG TABS tablet Take 1 tablet (5 mg total) by mouth 2 (two) times daily. 10/06/23   Singh, Prashant K, MD  Blood Glucose Monitoring Suppl (BLOOD GLUCOSE MONITOR SYSTEM) w/Device KIT Use as directed in the morning, at noon, and at bedtime. 10/06/23   Singh, Prashant K, MD  carvedilol  (COREG ) 12.5 MG tablet Take 1 tablet (12.5 mg total) by mouth 2 (two) times daily with a meal. 10/06/23   Dennise Lavada POUR, MD  cephALEXin  (KEFLEX ) 500 MG capsule Take 1 capsule (500 mg total) by mouth daily. 10/06/23   Singh, Prashant K, MD  Continuous Blood Gluc Sensor (DEXCOM G6 SENSOR) MISC by Does not apply route. Patient not taking: Reported on 11/13/2022 08/13/22   [provider]  furosemide  (LASIX ) 80 MG tablet Take 1 tablet (80 mg total) by mouth daily on your nondialysis days which will be Wednesday, Saturday and Sunday 10/06/23   Singh, Prashant K, MD  Glucose Blood (BLOOD GLUCOSE TEST STRIPS) STRP Use as directed in the morning, at noon, and at bedtime. 10/06/23 11/08/23  Dennise Lavada POUR, MD  glucose blood test strip Use as instructed 06/06/16   Dhungel, Nishant, MD  hydrALAZINE  (APRESOLINE ) 100 MG tablet Take 1 tablet (100 mg total) by mouth every 8 (eight) hours. 10/06/23   Singh, Prashant K, MD  insulin  aspart (NOVOLOG ) 100 UNIT/ML FlexPen Use as directed before each meal 3 times a day, 140-199 - 2 units, 200-250 - 4 units, 251-299 - 6 units,  300-349 - 8 units,  350 or above 10 units. 10/06/23   Singh, Prashant K, MD  Insulin  Pen Needle 32G X 4 MM MISC Use as directed 4 times a day for subcutaneous insulin  injection. 10/06/23   Singh, Prashant K, MD  Insulin  Syringe-Needle U-100 (INSULIN  SYRINGE 1CC/28G) 28G X  1/2 1 ML MISC 1 application by Does not apply route 3 (three) times daily before meals. 06/06/16   Dhungel, Barnetta, MD  isosorbide  mononitrate (IMDUR ) 30 MG 24 hr tablet Take by mouth. 08/05/22   [provider]  Lancet Device MISC 1 each by Does not apply route in the morning, at noon, and at bedtime. May substitute to any manufacturer covered by patient's insurance. 10/06/23 11/05/23  Singh, Prashant K, MD  MULTIPLE VITAMINS PO Take by mouth.    [provider]  nitroGLYCERIN  (NITROSTAT ) 0.4 MG SL tablet Take by mouth. 04/17/19   [provider]  omeprazole  (PRILOSEC) 20 MG capsule TAKE 1 CAPSULE (20 MG TOTAL) BY MOUTH 2 (TWO) TIMES DAILY BEFORE A MEAL. 04/14/23   Hope Almarie ORN, NP  rosuvastatin  (CRESTOR )  40 MG tablet Take 1 tablet (40 mg total) by mouth daily. 01/29/21   Singh, Prashant K, MD  Vitamin D , Ergocalciferol , (DRISDOL ) 1.25 MG (50000 UNIT) CAPS capsule Take 50,000 Units by mouth once a week. 03/19/21   [provider]   Current Facility-Administered Medications  Medication Dose Route Frequency Provider Last Rate Last Admin   acetaminophen  (TYLENOL ) tablet 650 mg  650 mg Oral Q6H PRN Claudene Maximino LABOR, MD       Or   acetaminophen  (TYLENOL ) suppository 650 mg  650 mg Rectal Q6H PRN Claudene Maximino A, MD       albuterol  (PROVENTIL ) (2.5 MG/3ML) 0.083% nebulizer solution 2.5 mg  2.5 mg Nebulization Q6H PRN Smith, Rondell A, MD       cefTRIAXone  (ROCEPHIN ) 2 g in sodium chloride  0.9 % 100 mL IVPB  2 g Intravenous Once Schuman, James T, PA-C       hydrocerin (EUCERIN) cream   Topical BID Claudene Maximino LABOR, MD       hydrOXYzine  (ATARAX ) tablet 50 mg  50 mg Oral TID PRN Smith, Rondell A, MD       pantoprazole  (PROTONIX ) injection 40 mg  40 mg Intravenous Q12H Smith, Rondell A, MD       sodium chloride  flush (NS) 0.9 % injection 3 mL  3 mL Intravenous Q12H Smith, Rondell A, MD       Current Outpatient Medications  Medication Sig Dispense Refill   Accu-Chek  Softclix Lancets lancets Use as directed in the morning, at noon, and at bedtime. 100 each 0   apixaban  (ELIQUIS ) 5 MG TABS tablet Take 1 tablet (5 mg total) by mouth 2 (two) times daily. 60 tablet 0   Blood Glucose Monitoring Suppl (BLOOD GLUCOSE MONITOR SYSTEM) w/Device KIT Use as directed in the morning, at noon, and at bedtime. 1 kit 0   carvedilol  (COREG ) 12.5 MG tablet Take 1 tablet (12.5 mg total) by mouth 2 (two) times daily with a meal. 60 tablet 0   cephALEXin  (KEFLEX ) 500 MG capsule Take 1 capsule (500 mg total) by mouth daily. 4 capsule 0   Continuous Blood Gluc Sensor (DEXCOM G6 SENSOR) MISC by Does not apply route. (Patient not taking: Reported on 11/13/2022)     furosemide  (LASIX ) 80 MG tablet Take 1 tablet (80 mg total) by mouth daily on your nondialysis days which will be Wednesday, Saturday and Sunday 30 tablet 0   Glucose Blood (BLOOD GLUCOSE TEST STRIPS) STRP Use as directed in the morning, at noon, and at bedtime. 100 strip 0   glucose blood test strip Use as instructed 100 each 3   hydrALAZINE  (APRESOLINE ) 100 MG tablet Take 1 tablet (100 mg total) by mouth every 8 (eight) hours. 90 tablet 0   insulin  aspart (NOVOLOG ) 100 UNIT/ML FlexPen Use as directed before each meal 3 times a day, 140-199 - 2 units, 200-250 - 4 units, 251-299 - 6 units,  300-349 - 8 units,  350 or above 10 units. 3 mL 0   Insulin  Pen Needle 32G X 4 MM MISC Use as directed 4 times a day for subcutaneous insulin  injection. 100 each 0   Insulin  Syringe-Needle U-100 (INSULIN  SYRINGE 1CC/28G) 28G X 1/2 1 ML MISC 1 application by Does not apply route 3 (three) times daily before meals. 100 each 0   isosorbide  mononitrate (IMDUR ) 30 MG 24 hr tablet Take by mouth.     Lancet Device MISC 1 each by Does not apply route in the morning,  at noon, and at bedtime. May substitute to any manufacturer covered by patient's insurance. 1 each 0   MULTIPLE VITAMINS PO Take by mouth.     nitroGLYCERIN  (NITROSTAT ) 0.4 MG SL tablet  Take by mouth.     omeprazole  (PRILOSEC) 20 MG capsule TAKE 1 CAPSULE (20 MG TOTAL) BY MOUTH 2 (TWO) TIMES DAILY BEFORE A MEAL. 180 capsule 1   rosuvastatin  (CRESTOR ) 40 MG tablet Take 1 tablet (40 mg total) by mouth daily. 30 tablet 0   Vitamin D , Ergocalciferol , (DRISDOL ) 1.25 MG (50000 UNIT) CAPS capsule Take 50,000 Units by mouth once a week.       ROS: As per HPI otherwise negative.  Physical Exam: Vitals:   10/12/23 1045 10/12/23 1226 10/12/23 1417 10/12/23 1421  BP: (!) 186/80 (!) 170/72  (!) 190/87  Pulse: 79 80  78  Resp: 15 14  14   Temp:  97.6 F (36.4 C)    TempSrc:  Oral    SpO2: 100% 100%  100%  Weight:   100.9 kg   Height:   5' 5 (1.651 m)      General: Sitting up in bed, no acute distress  Head: NCAT sclera not icteric MMM Neck: Supple. No JVD appreciated  Lungs: Clear bilaterally without wheezes, rales, or rhonchi. Normal WOB  Heart: RRR, no murmur, rub, or gallop  Abdomen: soft non-tender, bowel sounds normal, no masses  Lower extremities: diffuse excoriations from itching; 1-2+ edema to shins  Neuro: A & O X 3. Moves all extremities spontaneously. Psych:  Responds to questions appropriately with a normal affect. Dialysis Access: R thigh [facility]  Labs: Basic Metabolic Panel: Recent Labs  Lab 10/06/23 0446 10/12/23 0858  NA 137 138  K 4.8 4.1  CL 104 105  CO2 18* 22  GLUCOSE 84 166*  BUN 57* 27*  CREATININE 9.64* 7.12*  CALCIUM  6.7* 7.6*  PHOS 5.2*  --    Liver Function Tests: Recent Labs  Lab 10/06/23 0446 10/12/23 0858  AST  --  19  ALT  --  17  ALKPHOS  --  98  BILITOT  --  0.7  PROT  --  5.2*  ALBUMIN  1.7* 2.0*   Recent Labs  Lab 10/12/23 0858  LIPASE 22   Recent Labs  Lab 10/06/23 0446  AMMONIA 15   CBC: Recent Labs  Lab 10/06/23 0446 10/12/23 0938  WBC 12.8* 23.6*  NEUTROABS 9.7* 18.3*  HGB 8.9* 9.5*  HCT 27.9* 31.0*  MCV 90.9 95.7  PLT 177 180   Cardiac Enzymes: No results for input(s): CKTOTAL, CKMB,  CKMBINDEX, TROPONINI in the last 168 hours. CBG: Recent Labs  Lab 10/05/23 1637 10/05/23 2117 10/06/23 1209 10/06/23 1607 10/12/23 0921  GLUCAP 164* 112* 84 99 159*   Iron Studies: No results for input(s): IRON, TIBC, TRANSFERRIN, FERRITIN in the last 72 hours. Studies/Results: CT ABDOMEN PELVIS WO CONTRAST Result Date: 10/12/2023 CLINICAL DATA:  Abdominal pain and vomiting. Renal cancer status post partial nephrectomy. EXAM: CT ABDOMEN AND PELVIS WITHOUT CONTRAST TECHNIQUE: Multidetector CT imaging of the abdomen and pelvis was performed following the standard protocol without IV contrast. RADIATION DOSE REDUCTION: This exam was performed according to the departmental dose-optimization program which includes automated exposure control, adjustment of the mA and/or kV according to patient size and/or use of iterative reconstruction technique. COMPARISON:  CT abdomen pelvis dated 09/27/2023. FINDINGS: Evaluation of this exam is limited in the absence of intravenous contrast. Lower chest: The visualized lung bases are clear. There  is calcification of the mitral annulus. Right femoral venous catheter with tip in the right atrium. Partially visualized pericardial effusion measuring 12 mm in thickness. No intra-abdominal free air or free fluid. Hepatobiliary: The liver is unremarkable. No biliary ductal dilatation. The gallbladder is unremarkable. Pancreas: The pancreas is unremarkable as visualized. Spleen: Normal in size without focal abnormality. Adrenals/Urinary Tract: The adrenal glands unremarkable. Postsurgical changes of the upper pole of the right kidney. Small bilateral vascular calcifications. Faint 1 cm hypodense lesion in the upper pole of the left kidney is suboptimally characterized but similar to prior CT, likely a cyst. No hydronephrosis or obstructing stone. The visualized ureters and urinary bladder appear unremarkable. Stomach/Bowel: Thickened appearance of the rectum may be  related to underdistention. There is no bowel obstruction or active inflammation. The appendix is normal. Vascular/Lymphatic: Mild aortoiliac atherosclerotic disease. The IVC is unremarkable. No portal venous gas. Small retroperitoneal and periaortic lymph nodes. No adenopathy. Reproductive: The uterus is anteverted. No suspicious adnexal masses. Other: Small fat containing right lower ventral hernia. No bowel herniation or fluid collection. Mild diffuse subcutaneous edema. Musculoskeletal: No acute osseous pathology. IMPRESSION: 1. No acute intra-abdominal or pelvic pathology. 2. Postsurgical changes of the upper pole of the right kidney. 3. Partially visualized pericardial effusion. 4.  Aortic Atherosclerosis (ICD10-I70.0). Electronically Signed   By: Vanetta Chou M.D.   On: 10/12/2023 12:36    Dialysis Orders:  TCU MTThF 3:00 400/800 EDW 100kg 3K/2.5Ca TDC  -Calcitriol  0.5 mcg TIW  Assessment/Plan: Leukocytosis - WBC 23.6 on admission. Prior admit with UTI. Blood cultures ordered. Antibiotics per primary team Nausea/vomiting - with HD. Viral illness vs. Dialyzer reaction  ESRD -  New to HD. Has been unable to tolerate full treatment since discharge d/t nausea/hypotension during treatment. We suspect dialyzer reaction (unclear if she had similar episode with HD in hospital last week)  Will need hypoallergenic dialyzer as outpatient.  No urgent indications today. Plan for HD Tues  Hypertension/volume  - HypOtensive initially, now HypERtensive with volume overload. Home meds held, ok to resume today. UF as tolerated with HD.  Anemia  - Hgb 9.5. Due for ESA - will resume  Metabolic bone disease -  Ca acceptable. Continue VDRA/home binders  Nutrition - Renal diet with fluid restriction  Pruritus - Hydroxyzine  ordered. Concern for uremic pruritus with incomplete treatments,  however BUN 27 today.  HFpEF -  volume removal with HD T2DM - insulin  per primary   Maisie Ronnald Acosta PA-C [facility] 10/12/2023, 2:45 PM

## 2023-10-12 NOTE — Plan of Care (Signed)
  Problem: Clinical Measurements: Goal: Respiratory complications will improve Outcome: Progressing   Problem: Activity: Goal: Risk for activity intolerance will decrease Outcome: Progressing   Problem: Nutrition: Goal: Adequate nutrition will be maintained Outcome: Progressing   

## 2023-10-12 NOTE — ED Notes (Signed)
 IV team states they can't get blood cultures off of a dialysis catheter.

## 2023-10-12 NOTE — ED Notes (Signed)
 Rn spoke with pt about urine sample. Pt is unsure she will be able to provide a sample due to the small amount of urine she produces. Pt is agreeable that she will provide urine if it is possible for her.

## 2023-10-12 NOTE — ED Triage Notes (Signed)
 New to dialysis as of January 2025. Usually takes zofran prior to dialysis but forgot today. Pt missed dialysis today as pt started having nausea, vomiting. Temp was 99 this AM but took Tylenol as of 6AM.  EMS VS: BP 106/48 HR 82 99% on RA

## 2023-10-12 NOTE — ED Notes (Signed)
Pt refused UA at this time

## 2023-10-12 NOTE — ED Notes (Signed)
 ED TO INPATIENT HANDOFF REPORT  ED Nurse Name and Phone #: Paulina [phone]  S Name/Age/Gender Sherri Dixon 43 y.o. female Room/Bed: 042C/042C  Code Status   Code Status: Full Code  Home/SNF/Other Home Patient oriented to: self, place, time, and situation Is this baseline? Yes   Triage Complete: Triage complete  Chief Complaint UTI (urinary tract infection) [N39.0] Leukocytosis [D72.829]  Triage Note New to dialysis as of January 2025. Usually takes zofran  prior to dialysis but forgot today. Pt missed dialysis today as pt started having nausea, vomiting. Temp was 99 this AM but took Tylenol  as of 6AM.  EMS VS: BP 106/48 HR 82 99% on RA    Allergies Allergies  Allergen Reactions   Amlodipine Swelling    Pelvis swells   Insulin  Glargine Other (See Comments) and Hives   Humalog  Kwikpen [Insulin  Lispro] Other (See Comments)    75/50---swelling and ulcers in mouth     Level of Care/Admitting Diagnosis ED Disposition     ED Disposition  Admit   Condition  --   Comment  Hospital Area: [facility] [[id number]]  Level of Care: Telemetry Medical [104]  May admit patient to [facility] or [facility] if equivalent level of care is available:: No  Covid Evaluation: Asymptomatic - no recent exposure (last 10 days) testing not required  Diagnosis: Leukocytosis [200030]  Admitting Physician: CLAUDENE MAXIMINO LABOR [[id number]]  Attending Physician: CLAUDENE MAXIMINO LABOR [[id number]]  Certification:: I certify this patient will need inpatient services for at least 2 midnights          B Medical/Surgery History Past Medical History:  Diagnosis Date   Anemia    Asthma    CHF (congestive heart failure) (HCC)    Pt reports that father has CHF, not her   Diabetes mellitus without complication (HCC)    Dyslipidemia    GERD (gastroesophageal reflux disease)    Pulmonary embolism (HCC)    Stroke [facility])    Past Surgical History:  Procedure Laterality Date    APPLICATION OF WOUND VAC Right 04/12/2021   Procedure: APPLICATION OF WOUND VAC;  Surgeon: Elsa Lonni SAUNDERS, MD;  Location: [facility] OR;  Service: Orthopedics;  Laterality: Right;   DIALYSIS/PERMA CATHETER INSERTION N/A 10/02/2023   Procedure: DIALYSIS/PERMA CATHETER INSERTION;  Surgeon: Pearline Norman RAMAN, MD;  Location: [facility] INVASIVE CV LAB;  Service: Cardiovascular;  Laterality: N/A;   I & D EXTREMITY Right 04/06/2021   Procedure: IRRIGATION AND DEBRIDEMENT TOE WITH WOUND VAC PLACEMENT;  Surgeon: Elsa Lonni SAUNDERS, MD;  Location: [facility];  Service: Orthopedics;  Laterality: Right;   I & D EXTREMITY Right 04/12/2021   Procedure: IRRIGATION AND DEBRIDEMENT RIGHT FOOT;  Surgeon: Elsa Lonni SAUNDERS, MD;  Location: [facility] OR;  Service: Orthopedics;  Laterality: Right;   IR FLUORO GUIDE CV LINE RIGHT  10/01/2023   IR FLUORO GUIDE CV LINE RIGHT  10/01/2023   IR FLUORO GUIDE CV LINE RIGHT  10/02/2023   IR US  GUIDE VASC ACCESS RIGHT  10/01/2023   MOUTH SURGERY     ROBOTIC ASSITED PARTIAL NEPHRECTOMY Right 07/28/2019   Procedure: XI ROBOTIC ASSITED PARTIAL NEPHRECTOMY;  Surgeon: Sherrilee Belvie CROME, MD;  Location: [facility] ORS;  Service: Urology;  Laterality: Right;  3 HRS   SECONDARY CLOSURE OF WOUND Right 06/18/2021   Procedure: SECONDARY CLOSURE OF WOUND tRANSMETATARSAL AMPUTAION;  Surgeon: Elsa Lonni SAUNDERS, MD;  Location: [facility] OR;  Service: Orthopedics;  Laterality: Right;   TRANSMETATARSAL AMPUTATION Right 04/12/2021   Procedure: TRANSMETATARSAL  AMPUTATION;  Surgeon: Elsa Lonni SAUNDERS, MD;  Location: [facility] OR;  Service: Orthopedics;  Laterality: Right;     A IV Location/Drains/Wounds Patient Lines/Drains/Airways Status     Active Line/Drains/Airways     Name Placement date Placement time Site Days   Hemodialysis Catheter Right Femoral vein Double lumen Permanent (Tunneled) 10/02/23  1612  Femoral vein  10   Negative Pressure Wound Therapy Foot Anterior;Right 06/18/21  1154  --  846   Incision - 6 Ports Abdomen 1:  Right;Lateral;Mid;Upper 2: Right;Lateral;Upper 3: Right;Lateral;Mid;Umbilicus 4: Right;Lateral;Upper;Umbilicus 5: Right;Lateral;Umbilicus 6: Right;Lateral;Lower 07/28/19  --  -- 1537            Intake/Output Last 24 hours No intake or output data in the 24 hours ending 10/12/23 1509  Labs/Imaging Results for orders placed or performed during the hospital encounter of 10/12/23 (from the past 48 hours)  Comprehensive metabolic panel     Status: Abnormal   Collection Time: 10/12/23  8:58 AM  Result Value Ref Range   Sodium 138 135 - 145 mmol/L   Potassium 4.1 3.5 - 5.1 mmol/L   Chloride 105 98 - 111 mmol/L   CO2 22 22 - 32 mmol/L   Glucose, Bld 166 (H) 70 - 99 mg/dL    Comment: Glucose reference range applies only to samples taken after fasting for at least 8 hours.   BUN 27 (H) 6 - 20 mg/dL   Creatinine, Ser 2.87 (H) 0.44 - 1.00 mg/dL   Calcium  7.6 (L) 8.9 - 10.3 mg/dL   Total Protein 5.2 (L) 6.5 - 8.1 g/dL   Albumin  2.0 (L) 3.5 - 5.0 g/dL   AST 19 15 - 41 U/L   ALT 17 0 - 44 U/L   Alkaline Phosphatase 98 38 - 126 U/L   Total Bilirubin 0.7 0.0 - 1.2 mg/dL   GFR, Estimated 7 (L) >60 mL/min    Comment: (NOTE) Calculated using the CKD-EPI Creatinine Equation (2021)    Anion gap 11 5 - 15    Comment: Performed at [facility], [address], [place], [place] [address]  Lipase, blood     Status: None   Collection Time: 10/12/23  8:58 AM  Result Value Ref Range   Lipase 22 11 - 51 U/L    Comment: Performed at [facility], [address] [place], [place] [address]  POC CBG, ED     Status: Abnormal   Collection Time: 10/12/23  9:21 AM  Result Value Ref Range   Glucose-Capillary 159 (H) 70 - 99 mg/dL    Comment: Glucose reference range applies only to samples taken after fasting for at least 8 hours.  CBC with Differential/Platelet     Status: Abnormal   Collection Time: 10/12/23  9:38 AM  Result Value Ref Range   WBC 23.6 (H) 4.0 - 10.5 K/uL    Comment:  REPEATED TO VERIFY   RBC 3.24 (L) 3.87 - 5.11 MIL/uL   Hemoglobin 9.5 (L) 12.0 - 15.0 g/dL    Comment: REPEATED TO VERIFY   HCT 31.0 (L) 36.0 - 46.0 %   MCV 95.7 80.0 - 100.0 fL   MCH 29.3 26.0 - 34.0 pg   MCHC 30.6 30.0 - 36.0 g/dL   RDW 84.4 88.4 - 84.4 %   Platelets 180 150 - 400 K/uL    Comment: REPEATED TO VERIFY   nRBC 0.0 0.0 - 0.2 %   Neutrophils Relative % 78 %   Neutro Abs 18.3 (  H) 1.7 - 7.7 K/uL   Lymphocytes Relative 5 %   Lymphs Abs 1.2 0.7 - 4.0 K/uL   Monocytes Relative 5 %   Monocytes Absolute 1.2 (H) 0.1 - 1.0 K/uL   Eosinophils Relative 10 %   Eosinophils Absolute 2.4 (H) 0.0 - 0.5 K/uL   Basophils Relative 0 %   Basophils Absolute 0.1 0.0 - 0.1 K/uL   WBC Morphology Mild Left Shift (1-5% metas, occ myelo)    RBC Morphology MORPHOLOGY UNREMARKABLE    Immature Granulocytes 2 %   Abs Immature Granulocytes 0.42 (H) 0.00 - 0.07 K/uL    Comment: Performed at [facility], [address], [place], [place] [address]  I-Stat Lactic Acid     Status: None   Collection Time: 10/12/23 12:28 PM  Result Value Ref Range   Lactic Acid, Venous 1.1 0.5 - 1.9 mmol/L   CT ABDOMEN PELVIS WO CONTRAST Result Date: 10/12/2023 CLINICAL DATA:  Abdominal pain and vomiting. Renal cancer status post partial nephrectomy. EXAM: CT ABDOMEN AND PELVIS WITHOUT CONTRAST TECHNIQUE: Multidetector CT imaging of the abdomen and pelvis was performed following the standard protocol without IV contrast. RADIATION DOSE REDUCTION: This exam was performed according to the departmental dose-optimization program which includes automated exposure control, adjustment of the mA and/or kV according to patient size and/or use of iterative reconstruction technique. COMPARISON:  CT abdomen pelvis dated 09/27/2023. FINDINGS: Evaluation of this exam is limited in the absence of intravenous contrast. Lower chest: The visualized lung bases are clear. There is calcification of the mitral annulus. Right femoral venous  catheter with tip in the right atrium. Partially visualized pericardial effusion measuring 12 mm in thickness. No intra-abdominal free air or free fluid. Hepatobiliary: The liver is unremarkable. No biliary ductal dilatation. The gallbladder is unremarkable. Pancreas: The pancreas is unremarkable as visualized. Spleen: Normal in size without focal abnormality. Adrenals/Urinary Tract: The adrenal glands unremarkable. Postsurgical changes of the upper pole of the right kidney. Small bilateral vascular calcifications. Faint 1 cm hypodense lesion in the upper pole of the left kidney is suboptimally characterized but similar to prior CT, likely a cyst. No hydronephrosis or obstructing stone. The visualized ureters and urinary bladder appear unremarkable. Stomach/Bowel: Thickened appearance of the rectum may be related to underdistention. There is no bowel obstruction or active inflammation. The appendix is normal. Vascular/Lymphatic: Mild aortoiliac atherosclerotic disease. The IVC is unremarkable. No portal venous gas. Small retroperitoneal and periaortic lymph nodes. No adenopathy. Reproductive: The uterus is anteverted. No suspicious adnexal masses. Other: Small fat containing right lower ventral hernia. No bowel herniation or fluid collection. Mild diffuse subcutaneous edema. Musculoskeletal: No acute osseous pathology. IMPRESSION: 1. No acute intra-abdominal or pelvic pathology. 2. Postsurgical changes of the upper pole of the right kidney. 3. Partially visualized pericardial effusion. 4.  Aortic Atherosclerosis (ICD10-I70.0). Electronically Signed   By: Vanetta Chou M.D.   On: 10/12/2023 12:36    Pending Labs Unresulted Labs (From admission, onward)     Start     Ordered   10/13/23 0500  Renal function panel  Daily,   R      10/12/23 1354   10/13/23 0500  CBC with Differential/Platelet  Tomorrow morning,   R        10/12/23 1354   10/12/23 1436  Type and screen [facility]  Once,   R        Comments: [facility]    10/12/23 1435   10/12/23 1421  Hemoglobin and hematocrit, blood  Once,   R        10/12/23 1420   10/12/23 1347  C-reactive protein  Add-on,   AD        10/12/23 1346   10/12/23 1244  Culture, blood (routine x 2)  BLOOD CULTURE X 2,   R (with STAT occurrences)      10/12/23 1243   10/12/23 0950  hCG, serum, qualitative  Once,   R        10/12/23 0950   10/12/23 0905  Urinalysis, Routine w reflex microscopic -Urine, Clean Catch  Once,   URGENT       Question:  Specimen Source  Answer:  Urine, Clean Catch   10/12/23 0904            Vitals/Pain Today's Vitals   10/12/23 1226 10/12/23 1417 10/12/23 1421 10/12/23 1425  BP: (!) 170/72  (!) 190/87   Pulse: 80  78   Resp: 14  14   Temp: 97.6 F (36.4 C)     TempSrc: Oral     SpO2: 100%  100%   Weight:  100.9 kg    Height:  5' 5 (1.651 m)    PainSc:    0-No pain    Isolation Precautions No active isolations  Medications Medications  cefTRIAXone  (ROCEPHIN ) 2 g in sodium chloride  0.9 % 100 mL IVPB (has no administration in time range)  sodium chloride  flush (NS) 0.9 % injection 3 mL (has no administration in time range)  acetaminophen  (TYLENOL ) tablet 650 mg (has no administration in time range)    Or  acetaminophen  (TYLENOL ) suppository 650 mg (has no administration in time range)  albuterol  (PROVENTIL ) (2.5 MG/3ML) 0.083% nebulizer solution 2.5 mg (has no administration in time range)  pantoprazole  (PROTONIX ) injection 40 mg (has no administration in time range)  hydrocerin (EUCERIN) cream (has no administration in time range)  hydrOXYzine  (ATARAX ) tablet 50 mg (has no administration in time range)  LORazepam  (ATIVAN ) injection 1 mg (1 mg Intravenous Given 10/12/23 0926)  HYDROmorphone  (DILAUDID ) injection 0.5 mg (0.5 mg Intravenous Given 10/12/23 1244)    Mobility walks with device     Focused Assessments     R Recommendations: See Admitting Provider Note  Report  given to:   Additional Notes:

## 2023-10-13 DIAGNOSIS — N3000 Acute cystitis without hematuria: Secondary | ICD-10-CM | POA: Diagnosis not present

## 2023-10-13 LAB — GLUCOSE, CAPILLARY
Glucose-Capillary: 134 mg/dL — ABNORMAL HIGH (ref 70–99)
Glucose-Capillary: 127 mg/dL — ABNORMAL HIGH (ref 70–99)
Glucose-Capillary: 199 mg/dL — ABNORMAL HIGH (ref 70–99)
Glucose-Capillary: 77 mg/dL (ref 70–99)

## 2023-10-13 LAB — RENAL FUNCTION PANEL
Albumin: 1.8 g/dL — ABNORMAL LOW (ref 3.5–5.0)
Anion gap: 10 (ref 5–15)
BUN: 31 mg/dL — ABNORMAL HIGH (ref 6–20)
CO2: 24 mmol/L (ref 22–32)
Calcium: 7.4 mg/dL — ABNORMAL LOW (ref 8.9–10.3)
Chloride: 106 mmol/L (ref 98–111)
Creatinine, Ser: 7.6 mg/dL — ABNORMAL HIGH (ref 0.44–1.00)
GFR, Estimated: 6 mL/min — ABNORMAL LOW (ref >60)
Glucose, Bld: 115 mg/dL — ABNORMAL HIGH (ref 70–99)
Phosphorus: 6.8 mg/dL — ABNORMAL HIGH (ref 2.5–4.6)
Potassium: 4.6 mmol/L (ref 3.5–5.1)
Sodium: 140 mmol/L (ref 135–145)

## 2023-10-13 LAB — CBC WITH DIFFERENTIAL/PLATELET
Abs Immature Granulocytes: 0.13 10*3/uL — ABNORMAL HIGH (ref 0.00–0.07)
Basophils Absolute: 0.1 10*3/uL (ref 0.0–0.1)
Basophils Relative: 0 %
Eosinophils Absolute: 3.6 10*3/uL — ABNORMAL HIGH (ref 0.0–0.5)
Eosinophils Relative: 19 %
HCT: 24.4 % — ABNORMAL LOW (ref 36.0–46.0)
Hemoglobin: 7.7 g/dL — ABNORMAL LOW (ref 12.0–15.0)
Immature Granulocytes: 1 %
Lymphocytes Relative: 7 %
Lymphs Abs: 1.3 10*3/uL (ref 0.7–4.0)
MCH: 29.5 pg (ref 26.0–34.0)
MCHC: 31.6 g/dL (ref 30.0–36.0)
MCV: 93.5 fL (ref 80.0–100.0)
Monocytes Absolute: 1.1 10*3/uL — ABNORMAL HIGH (ref 0.1–1.0)
Monocytes Relative: 6 %
Neutro Abs: 12.7 10*3/uL — ABNORMAL HIGH (ref 1.7–7.7)
Neutrophils Relative %: 67 %
Platelets: 162 10*3/uL (ref 150–400)
RBC: 2.61 MIL/uL — ABNORMAL LOW (ref 3.87–5.11)
RDW: 15.4 % (ref 11.5–15.5)
WBC: 18.8 10*3/uL — ABNORMAL HIGH (ref 4.0–10.5)
nRBC: 0 % (ref 0.0–0.2)

## 2023-10-13 LAB — MRSA NEXT GEN BY PCR, NASAL: MRSA by PCR Next Gen: NOT DETECTED

## 2023-10-13 MED ORDER — SODIUM CHLORIDE 0.9 % IV SOLN
1.0000 g | INTRAVENOUS | Status: DC
  Administered 2023-10-13 – 2023-10-14 (×2): 1 g via INTRAVENOUS
  Filled 2023-10-13 (×2): qty 10

## 2023-10-13 NOTE — TOC CM/SW Note (Addendum)
 Transition of Care [facility]) - Inpatient Brief Assessment   Patient Details  Name: Sherri Dixon MRN: [id number] Date of Birth: 29-Sep-1981  Transition of Care [facility]) CM/SW Contact:    Tom-Johnson, Harvest Muskrat, RN Phone Number: 10/13/2023, 3:07 PM   Clinical Narrative:  Patient presented to the ED with Nausea and Vomiting. Patient was recently admitted from 09/27/23 with Volume Overload and treated for UTI. Patient was also started newly on HD 10/03/23. Patient currently admitted with UTI, on IV abx.  Nephrology following for inpatient HD.  From home with husband, has one daughter. Father and two siblings supportive. Modified independent with care and able to drive self. Husband drives to and from outpatient HD, on MTTF schedule. Currently unemployed, on disability.  Has a walker and w/c at home. PCP is Arby Lyle LABOR, NP and uses [facility] on [address]   No TOC needs or recommendations noted at this time.  Patient not Medically ready for discharge.  CM will continue to follow as patient progresses with care towards discharge.           Transition of Care Asessment: Insurance and Status: Insurance coverage has been reviewed Patient has primary care physician: Yes Home environment has been reviewed: Yes Prior level of function:: Modified Independent Prior/Current Home Services: No current home services Social Drivers of Health Review: SDOH reviewed no interventions necessary Readmission risk has been reviewed: Yes Transition of care needs: no transition of care needs at this time

## 2023-10-13 NOTE — Progress Notes (Signed)
 Pt receives out-pt HD at TCU at [facility] on Mon, Tues, Thurs, Fri. Will assist as needed.   Olivia Canter Renal Navigator [phone]

## 2023-10-13 NOTE — Progress Notes (Addendum)
 PROGRESS NOTE    Sherri Dixon  FMW:[id number] DOB: 1980/11/07 DOA: 10/12/2023 PCP: Arby Lyle LABOR, NP    Brief Narrative:   Sherri Dixon is a 43 y.o. 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  Patient had just been hospitalized from 09/27/2023 to 10/06/2023 with volume overload and recently started on hemodialysis 10/03/23.  Patient was noted to have an abnormal urinalysis during her hospitalization for which she was treated with 5 days of Keflex .  Patient had previously been on Xarelto  for treatment, but was switched weakness hospitalization to Eliquis  due to end-stage renal disease. She reported experiencing severe pelvic cramps during dialysis sessions. She also noted vaginal bleeding. The patient vomited blood during her most recent dialysis session today.  Denies this happening previously past.  She reports having lower stomach pain and needs, but only during dialysis.    The patient also reported leg swelling. She has a small skin lesion that has been itchy and red, which she has been treating with Aquaphor and Gold Bond Eczema Relief. She also reported generalized itching, which has been disrupting her sleep. She has tried Benadryl  and various lotions for relief.   In addition to these symptoms, the patient reported vaginal discomfort during bleeding episodes, along with a recent dark, foul-smelling discharge. She also experienced burning during urination. The patient's last menstrual cycle was on the 20th of the previous month.  Denies having any blood present in her stools.   In the emergency department patient was noted to be afebrile with blood pressures elevated up to 186/80, and all other vital signs maintained.  Labs significant for WBC 23.5, hemoglobin 9.5, BUN 27, creatinine 7.12,  calcium  7.6,  and lactic acid 1.1.CT scan of the abdomen pelvis noted no acute intra-abdominal pelvic pathology and noted partially visualized pericardial effusion.  EKG reveals sinus rhythm at 78 bpm with prolonged QT interval.  Patient had been given Ativan  milligram IV, Dilaudid  0.5 mg IV, and Rocephin  due to concern for bacteremia.  In-N-Out cath had been attempted but did not produce any urine.  Assessment & Plan:   Concern for urinary tract infection WBC elevated at 23.6 which is acutely higher than prior 12.8 on 1/7.  Question the possibility of urinary tract infection as patient had previous been treated for this during last hospitalization.  -- WBC 23.6>18.8 -- Blood Cultures x 2 : No growth less than 24 hours -- Ceftriaxone  1 g IV q24h -- CBC daily  End-stage renal disease on hemodialysis MTTF Patient just recently started hemodialysis on 10/03/23. She had gone to hemodialysis this morning, but did not have a complete session due to nausea and vomiting; with suspicion for dialyzer reaction. -- Nephrology following for continued hemodialysis while inpatient -- Continue HD pretreatment with Pepcid  20 mg p.o. daily, Benadryl  25 mg p.o. daily on HD days with hypoallergenic dialyzer for concern of suspected dialyzer reaction   Hematemesis with nausea Patient reports having nausea and vomiting with reports of blood present in emesis.  Suspect secondary to gastritis and not an overt upper GI bleed at this time -- Hgb 9.5>9.9>7.7 -- Protonix  40 mg IV q12h -- CBC daily   Vaginal bleeding Patient reports having vaginal bleeding for the last couple of days with abdominal cramping.  CT scan of the abdomen pelvis did not note any acute concerning  abnormalities.  Patient underwent speculum exam that did not note any -- Recommend outpatient follow-up with OB/GYN   Anemia of chronic medical/renal disease Hemoglobin 9.5 which appears higher than previous.  Noted to have reports of vaginal bleeding  and vomiting blood. -- Hgb 9.5>9.9>7.7 -- Currently holding home Eliquis  -- Transfuse hemoglobin less than 7.0   Essential hypertension Blood pressures initially up to 190/87. -- Isosorbide  mononitrate 30 mg p.o. daily -- Hydralazine  100 mg p.o. every 8 hours -- Carvedilol  12.5 g p.o. twice daily -- Furosemide  80 mg every Sunday/Wednesday/Saturday   Pericardial effusion Noted during last hospitalization repeat echocardiogram from 10/06/2023 noted minimal effusion increased which outpatient follow-up with cardiology had been recommended.   Pruritus Patient reports having generalized itching all over.  Thought  secondary to renal dysfunction. -- Hydroxyzine  50 mg PO TID PRN -- Gabapentin  100 mg PO qHS -- Continue Eucerin cream   Diabetes mellitus type 2, with long-term use of insulin  Hemoglobin A1c was 6.1 when checked on 09/28/23. -- Very sensitive SSI for coverage -- CBGs before every meal  . History of DVT/PE anticoagulation Patient last dose of Eliquis  was yesterday evening.  Patient had been switched from Xarelto  to Eliquis  during last hospitalization due to end-stage renal disease. -Resume Eliquis  if no signs of bleeding.   History of CVA -- Crestor  10 mg p.o. daily   Obesity, class III BMI 37.02 kg/m.  Complicates all facets of care   DVT prophylaxis: Place and maintain sequential compression device Start: 10/12/23 1453    Code Status: Full Code Family Communication: No for present at bedside this morning  Disposition Plan:  Level of care: Telemetry Medical Status is: Inpatient Remains inpatient appropriate because: IV antibiotics, HD today, potential discharge home tomorrow    Consultants:  Nephrology  Procedures:  None  Antimicrobials:  Ceftriaxone    Subjective: Patient seen examined bedside, sitting at edge of bed.  Overall feels improved.  Plan for HD later today.  Tolerating diet, denies any further episodes of hematemesis or vaginal bleeding.   Requesting changing location of right femoral HD catheter.  No other specific complaints, concerns or questions at this time.  Denies headache, no dizziness, no chest pain, no palpitations, no shortness of breath, no fever/chills/night sweats, no current nausea/vomiting, no diarrhea, no focal weakness, no fatigue, no paresthesias.  No acute events overnight per nursing staff.  Objective: Vitals:   10/12/23 1655 10/12/23 2115 10/13/23 0457 10/13/23 0920  BP: (!) 163/89 (!) 171/86 120/62 106/77  Pulse:  84 81 81  Resp: 18 18 18 20   Temp: 97.7 F (36.5 C) 98.3 F (36.8 C) 98 F (36.7 C) 98.1 F (36.7 C)  TempSrc: Oral Oral  Oral  SpO2: 98% 100% 97% 99%  Weight: 106.1 kg     Height: 5' 5 (1.651 m)       Intake/Output Summary (Last 24 hours) at 10/13/2023 1438 Last data filed at 10/13/2023 0900 Gross per 24 hour  Intake 340 ml  Output 0 ml  Net 340 ml   Filed Weights   10/12/23 1417 10/12/23 1655  Weight: 100.9 kg 106.1 kg    Examination:  Physical Exam: GEN: NAD, alert and oriented x 3, chronically ill appearance, obese HEENT: NCAT, PERRL, EOMI, sclera clear, MMM PULM: CTAB w/o wheezes/crackles, normal respiratory effort, on room air CV: RRR w/o M/G/R GI: abd soft, NTND, NABS, no R/G/M MSK: 2+ bilateral lower extremity peripheral edema, muscle strength globally intact 5/5 bilateral upper/lower extremities, right femoral TDC catheter noted in  place NEURO: CN II-XII intact, no focal deficits, sensation to light touch intact PSYCH: normal mood/affect Integumentary: dry/intact, no rashes or wounds    Data Reviewed: I have personally reviewed following labs and imaging studies  CBC: Recent Labs  Lab 10/12/23 0938 10/12/23 1528 10/13/23 0328  WBC 23.6*  --  18.8*  NEUTROABS 18.3*  --  12.7*  HGB 9.5* 9.9* 7.7*  HCT 31.0* 32.4* 24.4*  MCV 95.7  --  93.5  PLT 180  --  162   Basic Metabolic Panel: Recent Labs  Lab 10/12/23 0858 10/13/23 0328  NA 138 140  K 4.1 4.6   CL 105 106  CO2 22 24  GLUCOSE 166* 115*  BUN 27* 31*  CREATININE 7.12* 7.60*  CALCIUM  7.6* 7.4*  PHOS  --  6.8*   GFR: Estimated Creatinine Clearance: 11.4 mL/min (A) (by C-G formula based on SCr of 7.6 mg/dL (H)). Liver Function Tests: Recent Labs  Lab 10/12/23 0858 10/13/23 0328  AST 19  --   ALT 17  --   ALKPHOS 98  --   BILITOT 0.7  --   PROT 5.2*  --   ALBUMIN  2.0* 1.8*   Recent Labs  Lab 10/12/23 0858  LIPASE 22   No results for input(s): AMMONIA in the last 168 hours. Coagulation Profile: No results for input(s): INR, PROTIME in the last 168 hours. Cardiac Enzymes: No results for input(s): CKTOTAL, CKMB, CKMBINDEX, TROPONINI in the last 168 hours. BNP (last 3 results) No results for input(s): PROBNP in the last 8760 hours. HbA1C: No results for input(s): HGBA1C in the last 72 hours. CBG: Recent Labs  Lab 10/06/23 1607 10/12/23 0921 10/12/23 2114 10/13/23 0729 10/13/23 1121  GLUCAP 99 159* 130* 134* 127*   Lipid Profile: No results for input(s): CHOL, HDL, LDLCALC, TRIG, CHOLHDL, LDLDIRECT in the last 72 hours. Thyroid  Function Tests: No results for input(s): TSH, T4TOTAL, FREET4, T3FREE, THYROIDAB in the last 72 hours. Anemia Panel: No results for input(s): VITAMINB12, FOLATE, FERRITIN, TIBC, IRON, RETICCTPCT in the last 72 hours. Sepsis Labs: Recent Labs  Lab 10/12/23 1228  LATICACIDVEN 1.1    Recent Results (from the past 240 hours)  Culture, blood (routine x 2)     Status: None (Preliminary result)   Collection Time: 10/12/23  3:28 PM   Specimen: BLOOD  Result Value Ref Range Status   Specimen Description BLOOD RIGHT ANTECUBITAL  Final   Special Requests   Final    BOTTLES DRAWN AEROBIC AND ANAEROBIC Blood Culture results may not be optimal due to an inadequate volume of blood received in culture bottles   Culture   Final    NO GROWTH < 24 HOURS Performed at [facility],  [address] [place], [place] [address]    Report Status PENDING  Incomplete         Radiology Studies: DG Chest 1 View Result Date: 10/12/2023 CLINICAL DATA:  Nausea and vomiting. EXAM: CHEST  1 VIEW COMPARISON:  X-ray 09/30/2023. FINDINGS: Underinflation. Enlarged cardiopericardial silhouette with some prominence of the central vasculature. No pneumothorax or effusion. Presumed IVC catheter with the tip extending into the right atrium superiorly. Please correlate with history. IMPRESSION: Enlarged cardiopericardial silhouette with some vascular congestion underinflation. Presumed catheter via of the IVC extending along the upper right atrium. Please correlate with the type of catheter and if this needs to be at the IVC right atrial junction could be retracted 7 cm. Electronically Signed   By: Ranell  Charlanne M.D.   On: 10/12/2023 15:40   CT ABDOMEN PELVIS WO CONTRAST Result Date: 10/12/2023 CLINICAL DATA:  Abdominal pain and vomiting. Renal cancer status post partial nephrectomy. EXAM: CT ABDOMEN AND PELVIS WITHOUT CONTRAST TECHNIQUE: Multidetector CT imaging of the abdomen and pelvis was performed following the standard protocol without IV contrast. RADIATION DOSE REDUCTION: This exam was performed according to the departmental dose-optimization program which includes automated exposure control, adjustment of the mA and/or kV according to patient size and/or use of iterative reconstruction technique. COMPARISON:  CT abdomen pelvis dated 09/27/2023. FINDINGS: Evaluation of this exam is limited in the absence of intravenous contrast. Lower chest: The visualized lung bases are clear. There is calcification of the mitral annulus. Right femoral venous catheter with tip in the right atrium. Partially visualized pericardial effusion measuring 12 mm in thickness. No intra-abdominal free air or free fluid. Hepatobiliary: The liver is unremarkable. No biliary ductal dilatation. The gallbladder is unremarkable.  Pancreas: The pancreas is unremarkable as visualized. Spleen: Normal in size without focal abnormality. Adrenals/Urinary Tract: The adrenal glands unremarkable. Postsurgical changes of the upper pole of the right kidney. Small bilateral vascular calcifications. Faint 1 cm hypodense lesion in the upper pole of the left kidney is suboptimally characterized but similar to prior CT, likely a cyst. No hydronephrosis or obstructing stone. The visualized ureters and urinary bladder appear unremarkable. Stomach/Bowel: Thickened appearance of the rectum may be related to underdistention. There is no bowel obstruction or active inflammation. The appendix is normal. Vascular/Lymphatic: Mild aortoiliac atherosclerotic disease. The IVC is unremarkable. No portal venous gas. Small retroperitoneal and periaortic lymph nodes. No adenopathy. Reproductive: The uterus is anteverted. No suspicious adnexal masses. Other: Small fat containing right lower ventral hernia. No bowel herniation or fluid collection. Mild diffuse subcutaneous edema. Musculoskeletal: No acute osseous pathology. IMPRESSION: 1. No acute intra-abdominal or pelvic pathology. 2. Postsurgical changes of the upper pole of the right kidney. 3. Partially visualized pericardial effusion. 4.  Aortic Atherosclerosis (ICD10-I70.0). Electronically Signed   By: Vanetta Chou M.D.   On: 10/12/2023 12:36        Scheduled Meds:  carvedilol   12.5 mg Oral BID WC   Chlorhexidine  Gluconate Cloth  6 each Topical Q0600   diphenhydrAMINE   25 mg Oral Once in dialysis   famotidine   20 mg Oral Once in dialysis   [START ON 10/14/2023] furosemide   80 mg Oral Once per day on Sunday Wednesday Saturday   gabapentin   100 mg Oral QHS   hydrALAZINE   100 mg Oral Q8H   hydrocerin   Topical BID   insulin  aspart  0-6 Units Subcutaneous TID WC   isosorbide  mononitrate  30 mg Oral Daily   pantoprazole  (PROTONIX ) IV  40 mg Intravenous Q12H   rosuvastatin   10 mg Oral Daily   sodium  chloride flush  3 mL Intravenous Q12H   Continuous Infusions:  cefTRIAXone  (ROCEPHIN )  IV 1 g (10/13/23 0827)     LOS: 1 day    Time spent: 52 minutes spent on chart review, discussion with nursing staff, consultants, updating family and interview/physical exam; more than 50% of that time was spent in counseling and/or coordination of care.    Camellia PARAS Gayland Nicol, DO [facility] Available via Epic secure chat 7am-7pm After these hours, please refer to coverage provider listed on [email or web address] 10/13/2023, 2:38 PM

## 2023-10-13 NOTE — Plan of Care (Signed)
  Problem: Education: Goal: Knowledge of disease and its progression will improve Outcome: Progressing Goal: Individualized Educational Video(s) Outcome: Progressing   Problem: Fluid Volume: Goal: Compliance with measures to maintain balanced fluid volume will improve Outcome: Progressing   Problem: Health Behavior/Discharge Planning: Goal: Ability to manage health-related needs will improve Outcome: Progressing   Problem: Nutritional: Goal: Ability to make healthy dietary choices will improve Outcome: Progressing   Problem: Clinical Measurements: Goal: Complications related to the disease process, condition or treatment will be avoided or minimized Outcome: Progressing   Problem: Education: Goal: Ability to describe self-care measures that may prevent or decrease complications (Diabetes Survival Skills Education) will improve Outcome: Progressing Goal: Individualized Educational Video(s) Outcome: Progressing   Problem: Coping: Goal: Ability to adjust to condition or change in health will improve Outcome: Progressing   Problem: Fluid Volume: Goal: Ability to maintain a balanced intake and output will improve Outcome: Progressing   Problem: Health Behavior/Discharge Planning: Goal: Ability to identify and utilize available resources and services will improve Outcome: Progressing Goal: Ability to manage health-related needs will improve Outcome: Progressing   Problem: Metabolic: Goal: Ability to maintain appropriate glucose levels will improve Outcome: Progressing   Problem: Nutritional: Goal: Maintenance of adequate nutrition will improve Outcome: Progressing Goal: Progress toward achieving an optimal weight will improve Outcome: Progressing   Problem: Skin Integrity: Goal: Risk for impaired skin integrity will decrease Outcome: Progressing   Problem: Tissue Perfusion: Goal: Adequacy of tissue perfusion will improve Outcome: Progressing   Problem:  Education: Goal: Knowledge of General Education information will improve Description: Including pain rating scale, medication(s)/side effects and non-pharmacologic comfort measures Outcome: Progressing   Problem: Health Behavior/Discharge Planning: Goal: Ability to manage health-related needs will improve Outcome: Progressing   Problem: Clinical Measurements: Goal: Ability to maintain clinical measurements within normal limits will improve Outcome: Progressing Goal: Will remain free from infection Outcome: Progressing Goal: Diagnostic test results will improve Outcome: Progressing Goal: Respiratory complications will improve Outcome: Progressing Goal: Cardiovascular complication will be avoided Outcome: Progressing   Problem: Activity: Goal: Risk for activity intolerance will decrease Outcome: Progressing   Problem: Nutrition: Goal: Adequate nutrition will be maintained Outcome: Progressing   Problem: Coping: Goal: Level of anxiety will decrease Outcome: Progressing   Problem: Elimination: Goal: Will not experience complications related to bowel motility Outcome: Progressing Goal: Will not experience complications related to urinary retention Outcome: Progressing   Problem: Pain Management: Goal: General experience of comfort will improve Outcome: Progressing   Problem: Safety: Goal: Ability to remain free from injury will improve Outcome: Progressing   Problem: Skin Integrity: Goal: Risk for impaired skin integrity will decrease Outcome: Progressing

## 2023-10-13 NOTE — Progress Notes (Signed)
 [facility] Progress Note   Subjective:  Seen in room. Feels better today. Itching better with gabapentin . Plan for dialysis today, premedications ordered. Has some anxiety around dialysis.   Objective Vitals:   10/12/23 1655 10/12/23 2115 10/13/23 0457 10/13/23 0920  BP: (!) 163/89 (!) 171/86 120/62 106/77  Pulse:  84 81 81  Resp: 18 18 18 20   Temp: 97.7 F (36.5 C) 98.3 F (36.8 C) 98 F (36.7 C) 98.1 F (36.7 C)  TempSrc: Oral Oral  Oral  SpO2: 98% 100% 97% 99%  Weight: 106.1 kg     Height: 5' 5 (1.651 m)       Additional Objective Labs: Basic Metabolic Panel: Recent Labs  Lab 10/12/23 0858 10/13/23 0328  NA 138 140  K 4.1 4.6  CL 105 106  CO2 22 24  GLUCOSE 166* 115*  BUN 27* 31*  CREATININE 7.12* 7.60*  CALCIUM  7.6* 7.4*  PHOS  --  6.8*   CBC: Recent Labs  Lab 10/12/23 0938 10/12/23 1528 10/13/23 0328  WBC 23.6*  --  18.8*  NEUTROABS 18.3*  --  12.7*  HGB 9.5* 9.9* 7.7*  HCT 31.0* 32.4* 24.4*  MCV 95.7  --  93.5  PLT 180  --  162   Blood Culture    Component Value Date/Time   SDES BLOOD RIGHT ANTECUBITAL 10/12/2023 1528   SPECREQUEST  10/12/2023 1528    BOTTLES DRAWN AEROBIC AND ANAEROBIC Blood Culture results may not be optimal due to an inadequate volume of blood received in culture bottles   CULT  10/12/2023 1528    NO GROWTH < 24 HOURS Performed at [facility], [address] [place], [place] [address]    REPTSTATUS PENDING 10/12/2023 1528     Physical Exam General: Alert, sitting up, nad Heart: RRR  Lungs: Clear bilaterally  Abdomen: soft non-tender  Extremities: 2+ LE edema  Dialysis Access: R fem TDC in place   Medications:  cefTRIAXone  (ROCEPHIN )  IV 1 g (10/13/23 0827)    carvedilol   12.5 mg Oral BID WC   Chlorhexidine  Gluconate Cloth  6 each Topical Q0600   diphenhydrAMINE   25 mg Oral Once in dialysis   famotidine   20 mg Oral Once in dialysis   [START ON 10/14/2023] furosemide   80 mg Oral Once per  day on Sunday Wednesday Saturday   gabapentin   100 mg Oral QHS   hydrALAZINE   100 mg Oral Q8H   hydrocerin   Topical BID   insulin  aspart  0-6 Units Subcutaneous TID WC   isosorbide  mononitrate  30 mg Oral Daily   pantoprazole  (PROTONIX ) IV  40 mg Intravenous Q12H   rosuvastatin   10 mg Oral Daily   sodium chloride  flush  3 mL Intravenous Q12H    Dialysis Orders:  TCU MTThF 3:00 400/800 EDW 100kg 3K/2.5Ca TDC  -Calcitriol  0.5 mcg TIW   Assessment/Plan: Leukocytosis - WBC 23.6 on admission. Improving. Treating for UTI - on Rocephin . Blood cultures NGTD. Antibiotics per primary team Nausea/vomiting - Improved today.  ESRD -  New to HD. Has been unable to tolerate full treatment since discharge d/t nausea/hypotension during treatment. We suspect dialyzer reaction (unclear if she had similar episode with HD in hospital last week)  Will need hypoallergenic dialyzer as outpatient.  Plan for HD today with pre medications (Benadryl , Pepcid  ordered)  Hypertension/volume  - BP controlled this am.  Home meds resumed.  UF as tolerated with HD.  Anemia  - Hgb 9.5. Due for  ESA - will resume  Metabolic bone disease -  Ca acceptable. Continue VDRA/home binders  Nutrition - Renal diet with fluid restriction  Pruritus - Concern for uremic pruritus with incomplete treatments. Low dose gabapentin  ordered.  HFpEF -  volume removal with HD T2DM - insulin  per primary   Maisie Ronnald Acosta PA-C [facility] 10/13/2023,11:14 AM

## 2023-10-14 ENCOUNTER — Other Ambulatory Visit (HOSPITAL_COMMUNITY): Payer: Self-pay

## 2023-10-14 DIAGNOSIS — N3000 Acute cystitis without hematuria: Secondary | ICD-10-CM | POA: Diagnosis not present

## 2023-10-14 LAB — RENAL FUNCTION PANEL
Albumin: 1.9 g/dL — ABNORMAL LOW (ref 3.5–5.0)
Anion gap: 4 — ABNORMAL LOW (ref 5–15)
BUN: 17 mg/dL (ref 6–20)
CO2: 29 mmol/L (ref 22–32)
Calcium: 7.1 mg/dL — ABNORMAL LOW (ref 8.9–10.3)
Chloride: 100 mmol/L (ref 98–111)
Creatinine, Ser: 4.76 mg/dL — ABNORMAL HIGH (ref 0.44–1.00)
GFR, Estimated: 11 mL/min — ABNORMAL LOW (ref >60)
Glucose, Bld: 127 mg/dL — ABNORMAL HIGH (ref 70–99)
Phosphorus: 4 mg/dL (ref 2.5–4.6)
Potassium: 3.9 mmol/L (ref 3.5–5.1)
Sodium: 133 mmol/L — ABNORMAL LOW (ref 135–145)

## 2023-10-14 LAB — CBC
HCT: 23.7 % — ABNORMAL LOW (ref 36.0–46.0)
Hemoglobin: 7.5 g/dL — ABNORMAL LOW (ref 12.0–15.0)
MCH: 29.4 pg (ref 26.0–34.0)
MCHC: 31.6 g/dL (ref 30.0–36.0)
MCV: 92.9 fL (ref 80.0–100.0)
Platelets: 173 10*3/uL (ref 150–400)
RBC: 2.55 MIL/uL — ABNORMAL LOW (ref 3.87–5.11)
RDW: 14.8 % (ref 11.5–15.5)
WBC: 18.3 10*3/uL — ABNORMAL HIGH (ref 4.0–10.5)
nRBC: 0 % (ref 0.0–0.2)

## 2023-10-14 LAB — GLUCOSE, CAPILLARY
Glucose-Capillary: 136 mg/dL — ABNORMAL HIGH (ref 70–99)
Glucose-Capillary: 99 mg/dL (ref 70–99)

## 2023-10-14 MED ORDER — GABAPENTIN 100 MG PO CAPS
100.0000 mg | ORAL_CAPSULE | Freq: Every day | ORAL | 0 refills | Status: DC
  Filled 2023-10-14: qty 30, 30d supply, fill #0

## 2023-10-14 MED ORDER — DIPHENHYDRAMINE HCL 25 MG PO CAPS
25.0000 mg | ORAL_CAPSULE | ORAL | 0 refills | Status: DC
  Filled 2023-10-14: qty 90, 210d supply, fill #0

## 2023-10-14 MED ORDER — ROSUVASTATIN CALCIUM 10 MG PO TABS
10.0000 mg | ORAL_TABLET | Freq: Every day | ORAL | 11 refills | Status: AC
  Filled 2023-10-14: qty 30, 30d supply, fill #0

## 2023-10-14 MED ORDER — CEFADROXIL 500 MG PO CAPS
500.0000 mg | ORAL_CAPSULE | Freq: Two times a day (BID) | ORAL | 0 refills | Status: AC
  Filled 2023-10-14: qty 8, 4d supply, fill #0

## 2023-10-14 MED ORDER — FAMOTIDINE 20 MG PO TABS
20.0000 mg | ORAL_TABLET | ORAL | 0 refills | Status: DC
  Filled 2023-10-14: qty 1, 2d supply, fill #0

## 2023-10-14 NOTE — Progress Notes (Signed)
 D/C order noted. Contacted TCU RN to be advised of pt's d/c today and that pt should resume care tomorrow.   Olivia Canter Renal Navigator [phone]

## 2023-10-14 NOTE — Progress Notes (Signed)
 Pic dcd. Ccmd made aware of dc order. All belongings given to patient. Volunteer to take patient out to the main entrance.

## 2023-10-14 NOTE — Plan of Care (Signed)
  Problem: Education: Goal: Knowledge of disease and its progression will improve Outcome: Adequate for Discharge Goal: Individualized Educational Video(s) Outcome: Adequate for Discharge   Problem: Fluid Volume: Goal: Compliance with measures to maintain balanced fluid volume will improve Outcome: Adequate for Discharge   Problem: Health Behavior/Discharge Planning: Goal: Ability to manage health-related needs will improve Outcome: Adequate for Discharge   Problem: Nutritional: Goal: Ability to make healthy dietary choices will improve Outcome: Adequate for Discharge   Problem: Clinical Measurements: Goal: Complications related to the disease process, condition or treatment will be avoided or minimized Outcome: Adequate for Discharge   Problem: Education: Goal: Ability to describe self-care measures that may prevent or decrease complications (Diabetes Survival Skills Education) will improve Outcome: Adequate for Discharge Goal: Individualized Educational Video(s) Outcome: Adequate for Discharge   Problem: Coping: Goal: Ability to adjust to condition or change in health will improve Outcome: Adequate for Discharge   Problem: Fluid Volume: Goal: Ability to maintain a balanced intake and output will improve Outcome: Adequate for Discharge   Problem: Health Behavior/Discharge Planning: Goal: Ability to identify and utilize available resources and services will improve Outcome: Adequate for Discharge Goal: Ability to manage health-related needs will improve Outcome: Adequate for Discharge   Problem: Metabolic: Goal: Ability to maintain appropriate glucose levels will improve Outcome: Adequate for Discharge   Problem: Nutritional: Goal: Maintenance of adequate nutrition will improve Outcome: Adequate for Discharge Goal: Progress toward achieving an optimal weight will improve Outcome: Adequate for Discharge   Problem: Skin Integrity: Goal: Risk for impaired skin  integrity will decrease Outcome: Adequate for Discharge   Problem: Tissue Perfusion: Goal: Adequacy of tissue perfusion will improve Outcome: Adequate for Discharge   Problem: Education: Goal: Knowledge of General Education information will improve Description: Including pain rating scale, medication(s)/side effects and non-pharmacologic comfort measures Outcome: Adequate for Discharge   Problem: Health Behavior/Discharge Planning: Goal: Ability to manage health-related needs will improve Outcome: Adequate for Discharge   Problem: Clinical Measurements: Goal: Ability to maintain clinical measurements within normal limits will improve Outcome: Adequate for Discharge Goal: Will remain free from infection Outcome: Adequate for Discharge Goal: Diagnostic test results will improve Outcome: Adequate for Discharge Goal: Respiratory complications will improve Outcome: Adequate for Discharge Goal: Cardiovascular complication will be avoided Outcome: Adequate for Discharge   Problem: Activity: Goal: Risk for activity intolerance will decrease Outcome: Adequate for Discharge   Problem: Nutrition: Goal: Adequate nutrition will be maintained Outcome: Adequate for Discharge   Problem: Coping: Goal: Level of anxiety will decrease Outcome: Adequate for Discharge   Problem: Elimination: Goal: Will not experience complications related to bowel motility Outcome: Adequate for Discharge Goal: Will not experience complications related to urinary retention Outcome: Adequate for Discharge   Problem: Pain Management: Goal: General experience of comfort will improve Outcome: Adequate for Discharge   Problem: Safety: Goal: Ability to remain free from injury will improve Outcome: Adequate for Discharge   Problem: Skin Integrity: Goal: Risk for impaired skin integrity will decrease Outcome: Adequate for Discharge

## 2023-10-14 NOTE — Discharge Planning (Signed)
 [facility] Patient Discharge Orders- [facility] CLINIC: TCU  Patient's name: Perlie Ohlmann Admit/DC Dates: 10/12/2023 - 10/14/2023  Discharge Diagnoses: Leukocytosis/Concern for UTI. Completing PO antibiotics.  Nausea/vomiting  Pruritus   Recent Labs  Lab 10/14/23 0847  HGB 7.5*  K 3.9  CALCIUM  7.1*  PHOS 4.0  ALBUMIN  1.9*   Aranesp : Given: --   Date of last dose/amount: --   PRBC's Given: -- Date/# of units: -- Mircera: 75 mcg IV q 2wks (Start 1/16)   Outpatient Dialysis Orders:  -Heparin : No change  -EDW No change   -Bath: No change   Tolerated dialysis well with premedication (Continue Benadryl , Pepcid  pre HD)  Access intervention/Change: --  OTHER/APPTS -Follow up blood cultures here -Refer to [facility] for perm access -Follow up on  hypoallergenic dialyzer    Completed by: Elona Hal PA-C   D/C Meds to be reconciled by nurse after every discharge.    Reviewed by: MD:______ RN_______

## 2023-10-14 NOTE — Progress Notes (Signed)
 [facility] Progress Note   Subjective:  Seen in room. Feels much better today. Tolerated dialysis last night with no issues. No cp, sob, n/v. Plans for discharge today    Objective Vitals:   10/14/23 0500 10/14/23 0530 10/14/23 0600 10/14/23 0751  BP: (!) 143/62 (!) 151/66 139/76 122/68  Pulse: 95 96 98   Resp: 18 (!) 22 16 18   Temp:   98.2 F (36.8 C) 98.5 F (36.9 C)  TempSrc:   Oral Oral  SpO2: 99% 100% 100% 100%  Weight:      Height:        Additional Objective Labs: Basic Metabolic Panel: Recent Labs  Lab 10/12/23 0858 10/13/23 0328 10/14/23 0847  NA 138 140 133*  K 4.1 4.6 3.9  CL 105 106 100  CO2 22 24 29   GLUCOSE 166* 115* 127*  BUN 27* 31* 17  CREATININE 7.12* 7.60* 4.76*  CALCIUM  7.6* 7.4* 7.1*  PHOS  --  6.8* 4.0   CBC: Recent Labs  Lab 10/12/23 0938 10/12/23 1528 10/13/23 0328 10/14/23 0847  WBC 23.6*  --  18.8* 18.3*  NEUTROABS 18.3*  --  12.7*  --   HGB 9.5* 9.9* 7.7* 7.5*  HCT 31.0* 32.4* 24.4* 23.7*  MCV 95.7  --  93.5 92.9  PLT 180  --  162 173   Blood Culture    Component Value Date/Time   SDES BLOOD RIGHT ANTECUBITAL 10/12/2023 1528   SPECREQUEST  10/12/2023 1528    BOTTLES DRAWN AEROBIC AND ANAEROBIC Blood Culture results may not be optimal due to an inadequate volume of blood received in culture bottles   CULT  10/12/2023 1528    NO GROWTH 2 DAYS Performed at [facility], [address], [place], [place] [address]    REPTSTATUS PENDING 10/12/2023 1528     Physical Exam General: Alert, sitting up, nad Heart: RRR  Lungs: Clear bilaterally  Abdomen: soft non-tender  Extremities: 2+ LE edema  Dialysis Access: R fem TDC in place   Medications:  cefTRIAXone  (ROCEPHIN )  IV 1 g (10/14/23 0758)    carvedilol   12.5 mg Oral BID WC   Chlorhexidine  Gluconate Cloth  6 each Topical Q0600   diphenhydrAMINE   25 mg Oral Once in dialysis   famotidine   20 mg Oral Once in dialysis   furosemide   80 mg Oral Once  per day on Sunday Wednesday Saturday   gabapentin   100 mg Oral QHS   hydrALAZINE   100 mg Oral Q8H   hydrocerin   Topical BID   insulin  aspart  0-6 Units Subcutaneous TID WC   isosorbide  mononitrate  30 mg Oral Daily   pantoprazole  (PROTONIX ) IV  40 mg Intravenous Q12H   rosuvastatin   10 mg Oral Daily   sodium chloride  flush  3 mL Intravenous Q12H    Dialysis Orders:  TCU MTThF 3:00 400/800 EDW 100kg 3K/2.5Ca TDC  -Calcitriol  0.5 mcg TIW   Assessment/Plan: Leukocytosis - WBC 23.6 on admission. Improving. Treating for UTI - on Rocephin . Blood cultures NGTD. Antibiotics per primary team Nausea/vomiting - Resolved. ESRD -  New to HD. Has been unable to tolerate full treatment since discharge d/t nausea/hypotension during treatment. We suspect dialyzer reaction (unclear if she had similar episode with HD in hospital last week)  Will need hypoallergenic dialyzer as outpatient.  Continue pre HD medications (Benadryl , Pepcid ) as outpatient  Access - Fem TDC in use. Ready to get perm access placed - will refer to [facility] as outpatient  Hypertension/volume  - BP controlled this am.  Home meds resumed.  UF as tolerated with HD.  Anemia  - Hgb 9.5. Due for ESA - will resume  Metabolic bone disease -  Ca acceptable. Continue VDRA/home binders  Nutrition - Renal diet with fluid restriction  Pruritus - Concern for uremic pruritus with incomplete treatments. Continue low dose gabapentin  HFpEF -  volume removal with HD T2DM - insulin  per primary   Elona Hal PA-C [facility] 10/14/2023,10:57 AM

## 2023-10-14 NOTE — TOC Transition Note (Signed)
 Transition of Care [facility]) - Discharge Note   Patient Details  Name: Sherri Dixon MRN: [id number] Date of Birth: 08-12-1981  [facility] CM/SW Contact:  Tom-Johnson, Angelique Ken, RN Phone Number: 10/14/2023, 11:51 AM   Clinical Narrative:     Patient is scheduled for discharge today. Hospital f/u and discharge instructions on AVS. Prescription sent to [facility] pharmacy and patient to receive at bedside prior discharge. No TOC needs or recommendations noted. Husband John to transport at discharge. No further TOC needs noted.             Final next level of care: Home/Self Care Barriers to Discharge: Barriers Resolved   Patient Goals and CMS Choice Patient states their goals for this hospitalization and ongoing recovery are:: To return home CMS Medicare.gov Compare Post Acute Care list provided to:: Patient Choice offered to / list presented to : NA      Discharge Placement                Patient to be transferred to facility by: Husband Name of family member notified: Legrand Puma Jjr.    Discharge Plan and Services Additional resources added to the After Visit Summary for                  DME Arranged: N/A DME Agency: NA       HH Arranged: NA HH Agency: NA        Social Drivers of Health (SDOH) Interventions SDOH Screenings   Food Insecurity: No Food Insecurity (10/12/2023)  Housing: Low Risk  (10/12/2023)  Transportation Needs: No Transportation Needs (10/12/2023)  Utilities: Not At Risk (10/12/2023)  Depression (PHQ2-9): Low Risk  (11/13/2022)  Financial Resource Strain: Low Risk  (01/14/2023)   Received from [facility], [facility]  Physical Activity: Unknown (05/07/2022)   [facility]  Social Connections: Unknown (05/10/2023)   Received from [facility]  Stress: Stress Concern Present (08/01/2022)   Received from [facility], [facility]  Tobacco Use: Low Risk  (10/12/2023)      Readmission Risk Interventions    10/13/2023    3:05 PM  Readmission Risk Prevention Plan  Transportation Screening Complete  PCP or Specialist Appt within 3-5 Days Complete  HRI or Home Care Consult Complete  Social Work Consult for Recovery Care Planning/Counseling Complete  Palliative Care Screening Not Applicable  Medication Review Oceanographer) Referral to Pharmacy

## 2023-10-14 NOTE — Progress Notes (Signed)
 Piv dcd. Site unremarkable. Ccmd notified of dc order.

## 2023-10-14 NOTE — Progress Notes (Signed)
   10/14/23 0530  Vitals  BP (!) 151/66  MAP (mmHg) 85  BP Location Right Arm  BP Method Automatic  Patient Position (if appropriate) Lying  Pulse Rate 96  Pulse Rate Source Monitor  ECG Heart Rate 97  Resp (!) 22  Oxygen Therapy  SpO2 100 %  During Treatment Monitoring  Blood Flow Rate (mL/min) 400 mL/min  Arterial Pressure (mmHg) 230 mmHg  Venous Pressure (mmHg) 180 mmHg  Dialysate Flow Rate (mL/min) 300 ml/min  Intra-Hemodialysis Comments Tx completed  Post Treatment  Dialyzer Clearance Clear  Liters Processed 72  Fluid Removed (mL) 0 mL  Tolerated HD Treatment Yes  Post-Hemodialysis Comments pt stable  Hemodialysis Catheter Right Femoral vein Double lumen Permanent (Tunneled)  Placement Date/Time: 10/02/23 1612   Placed prior to admission: No  Serial / Lot #: [id number]  Expiration Date: 01/27/28  Time Out: Correct patient;Correct site;Correct procedure  Maximum sterile barrier precautions: Hand hygiene;Cap;Mask;Sterile gown...  Site Condition No complications  Blue Lumen Status Dead end cap in place  Red Lumen Status Dead end cap in place  Dressing Type Transparent  Dressing Status Antimicrobial disc in place;Clean, Dry, Intact  Dressing Change Due 10/18/23  Post treatment catheter status Capped and Clamped   Pt tolerated treaqtment without complication

## 2023-10-14 NOTE — Discharge Summary (Signed)
 Physician Discharge Summary  Shiron Eastham UJW:[id number] DOB: 1981/02/23 DOA: 10/12/2023  PCP: Tristan Furlough, NP  Admit date: 10/12/2023 Discharge date: 10/14/2023  Admitted From: Home Disposition: Home  Recommendations for Outpatient Follow-up:  Follow up with PCP in 1-2 weeks Outpatient follow-up with cardiology for pericardial effusion Outpatient follow-up with GYN for vaginal bleeding Started on Pepcid  and Benadryl  prior to HD  Gabapentin  100 mg p.o. nightly Recommend repeat CBC 1 week to assess hemoglobin level.  Home Health: No Equipment/Devices: None  Discharge Condition: Stable CODE STATUS: Full code Diet recommendation: Renal/consistent carbohydrate diet with 1.2 L fluid restriction  History of present illness:  Shellane Hobbs is a 43 y.o. 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  Patient had just been hospitalized from 09/27/2023 to 10/06/2023 with volume overload and recently started on hemodialysis 10/03/23.  Patient was noted to have an abnormal urinalysis during her hospitalization for which she was treated with 5 days of Keflex .  Patient had previously been on Xarelto  for treatment, but was switched weakness hospitalization to Eliquis  due to end-stage renal disease. She reported experiencing severe pelvic cramps during dialysis sessions. She also noted vaginal bleeding. The patient vomited blood during her most recent dialysis session today.  Denies this happening previously past.  She reports having lower stomach pain and needs, but only during dialysis.    The patient also reported leg swelling. She has a small skin lesion that has been itchy and red, which she has been treating with Aquaphor and Gold Bond Eczema Relief. She also reported generalized itching, which has  been disrupting her sleep. She has tried Benadryl  and various lotions for relief.   In addition to these symptoms, the patient reported vaginal discomfort during bleeding episodes, along with a recent dark, foul-smelling discharge. She also experienced burning during urination. The patient's last menstrual cycle was on the 20th of the previous month.  Denies having any blood present in her stools.   In the emergency department patient was noted to be afebrile with blood pressures elevated up to 186/80, and all other vital signs maintained.  Labs significant for WBC 23.5, hemoglobin 9.5, BUN 27, creatinine 7.12, calcium  7.6,  and lactic acid 1.1.CT scan of the abdomen pelvis noted no acute intra-abdominal pelvic pathology and noted partially visualized pericardial effusion.  EKG reveals sinus rhythm at 78 bpm with prolonged QT interval.  Patient had been given Ativan  milligram IV, Dilaudid  0.5 mg IV, and Rocephin  due to concern for bacteremia.  In-N-Out cath had been attempted but did not produce any urine.  Hospital course:  Concern for urinary tract infection WBC elevated at 23.6 which is acutely higher than prior 12.8 on 1/7.  Question the possibility of urinary tract infection as patient had previous been treated for this during last hospitalization.  Blood culture showed no growth during hospitalization.  Initially started on ceftriaxone  and will transition to cefadroxil  to complete course on discharge.   End-stage renal disease on hemodialysis MTTF Patient just recently started hemodialysis on 10/03/23. She had gone to hemodialysis this morning, but did not have a complete session due to nausea and vomiting; with suspicion for dialyzer reaction. Nephrology following for continued hemodialysis while inpatient. Continue HD pretreatment with Pepcid  20 mg p.o. daily, Benadryl  25 mg p.o. daily on HD days with hypoallergenic dialyzer for concern  of suspected dialyzer reaction   Hematemesis with  nausea Patient reports having nausea and vomiting with reports of blood present in emesis.  Suspect secondary to gastritis and not an overt upper GI bleed at this time.  Hemoglobin stable, 7.5 at time of discharge.  Continue PPI twice daily.  Recommend repeat CBC 1 week.   Vaginal bleeding Patient reports having vaginal bleeding for the last couple of days with abdominal cramping.  CT scan of the abdomen pelvis did not note any acute concerning abnormalities.  Patient underwent speculum exam in the ED that did not note any abnormality/bleeding.  Recommend outpatient follow-up with OB/GYN   Anemia of chronic medical/renal disease Recommend repeat CBC 1 week.  Transfuse for hemoglobin less than 7.0.   Essential hypertension Isosorbide  mononitrate 30 mg p.o. daily, Hydralazine  100 mg p.o. every 8 hours, Carvedilol  12.5 g p.o. twice daily, Furosemide  80 mg every Sunday/Wednesday/Saturday   Pericardial effusion Noted during last hospitalization repeat echocardiogram from 10/06/2023 noted minimal effusion increased which outpatient follow-up with cardiology had been recommended.   Pruritus Patient reports having generalized itching all over.  Thought  secondary to renal dysfunction.  Started on gabapentin , Eucerin cream as needed.   Diabetes mellitus type 2, with long-term use of insulin  Hemoglobin A1c was 6.1 when checked on 09/28/23.  Continue home NovoLog  sliding scale. Aaron Aas History of DVT/PE anticoagulation Patient had been switched from Xarelto  to Eliquis  during last hospitalization due to end-stage renal disease.  History of CVA Crestor  10 mg p.o. daily   Obesity, class III BMI 37.02 kg/m.  Complicates all facets of care  Discharge Diagnoses:  Principal Problem:   UTI (urinary tract infection) Active Problems:   Leukocytosis   ESRD on hemodialysis (HCC)   Hematemesis with nausea   Vaginal bleeding   Anemia of chronic disease   Essential hypertension   Pericardial effusion    Pruritus    Discharge Instructions  Discharge Instructions     Call MD for:  difficulty breathing, headache or visual disturbances   Complete by: As directed    Call MD for:  extreme fatigue   Complete by: As directed    Call MD for:  persistant dizziness or light-headedness   Complete by: As directed    Call MD for:  persistant nausea and vomiting   Complete by: As directed    Call MD for:  severe uncontrolled pain   Complete by: As directed    Call MD for:  temperature >100.4   Complete by: As directed    Diet - low sodium heart healthy   Complete by: As directed    Increase activity slowly   Complete by: As directed    No wound care   Complete by: As directed       Allergies as of 10/14/2023       Reactions   Amlodipine Swelling   Pelvis swells   Insulin  Glargine Other (See Comments), Hives   Humalog  Kwikpen [insulin  Lispro] Other (See Comments)   75/50---swelling and ulcers in mouth         Medication List     TAKE these medications    Accu-Chek Guide Test test strip Generic drug: glucose blood Use as directed in the morning, at noon, and at bedtime.   Accu-Chek Guide w/Device Kit Use as directed in the morning, at noon, and at bedtime.   Accu-Chek Softclix Lancets lancets Use as directed in the morning, at noon, and at bedtime.   acetaminophen  650 MG CR  tablet Commonly known as: TYLENOL  Take 650 mg by mouth once as needed for pain.   BD Pen Needle Nano U/F 32G X 4 MM Misc Generic drug: Insulin  Pen Needle Use as directed 4 times a day for subcutaneous insulin  injection.   carvedilol  12.5 MG tablet Commonly known as: COREG  Take 1 tablet (12.5 mg total) by mouth 2 (two) times daily with a meal.   cefadroxil  500 MG capsule Commonly known as: DURICEF Take 1 capsule (500 mg total) by mouth 2 (two) times daily for 4 days. Start taking on: October 15, 2023   Dexcom G6 Sensor Misc by Does not apply route.   diphenhydrAMINE  25 mg capsule Commonly  known as: BENADRYL  Take 1 capsule (25 mg total) by mouth 3 (three) times a week for 90 doses. Prior to dialysis   Eliquis  5 MG Tabs tablet Generic drug: apixaban  Take 1 tablet (5 mg total) by mouth 2 (two) times daily.   famotidine  20 MG tablet Commonly known as: PEPCID  Take 1 tablet (20 mg total) by mouth 3 (three) times a week. Prior to dialysis   furosemide  80 MG tablet Commonly known as: Lasix  Take 1 tablet (80 mg total) by mouth daily on your nondialysis days which will be Wednesday, Saturday and Sunday   gabapentin  100 MG capsule Commonly known as: NEURONTIN  Take 1 capsule (100 mg total) by mouth at bedtime.   hydrALAZINE  100 MG tablet Commonly known as: APRESOLINE  Take 1 tablet (100 mg total) by mouth every 8 (eight) hours.   isosorbide  mononitrate 30 MG 24 hr tablet Commonly known as: IMDUR  Take by mouth.   Lancet Device Misc 1 each by Does not apply route in the morning, at noon, and at bedtime. May substitute to any manufacturer covered by patient's insurance.   nitroGLYCERIN  0.4 MG SL tablet Commonly known as: NITROSTAT  Take by mouth.   NovoLOG  FlexPen 100 UNIT/ML FlexPen Generic drug: insulin  aspart Use as directed before each meal 3 times a day, 140-199 - 2 units, 200-250 - 4 units, 251-299 - 6 units,  300-349 - 8 units,  350 or above 10 units.   omeprazole  20 MG capsule Commonly known as: PRILOSEC TAKE 1 CAPSULE (20 MG TOTAL) BY MOUTH 2 (TWO) TIMES DAILY BEFORE A MEAL.   ondansetron  4 MG disintegrating tablet Commonly known as: ZOFRAN -ODT Take 4 mg by mouth once as needed for vomiting or nausea.   rosuvastatin  40 MG tablet Commonly known as: CRESTOR  Take 1 tablet (40 mg total) by mouth daily.   VITAMIN D  (CHOLECALCIFEROL) PO Take 1 capsule by mouth daily. Patient takes 1 gummy daily. 10/12/2023.        Follow-up Information     Tristan Furlough, NP. Schedule an appointment as soon as possible for a visit in 1 week(s).   Specialty: Nurse  Practitioner Contact information: [address] [place] [address] [phone]                Allergies  Allergen Reactions   Amlodipine Swelling    Pelvis swells   Insulin  Glargine Other (See Comments) and Hives   Humalog  Kwikpen [Insulin  Lispro] Other (See Comments)    75/50---swelling and ulcers in mouth     Consultations: Nephrology   Procedures/Studies: DG Chest 1 View Result Date: 10/12/2023 CLINICAL DATA:  Nausea and vomiting. EXAM: CHEST  1 VIEW COMPARISON:  X-ray 09/30/2023. FINDINGS: Underinflation. Enlarged cardiopericardial silhouette with some prominence of the central vasculature. No pneumothorax or effusion. Presumed IVC catheter  with the tip extending into the right atrium superiorly. Please correlate with history. IMPRESSION: Enlarged cardiopericardial silhouette with some vascular congestion underinflation. Presumed catheter via of the IVC extending along the upper right atrium. Please correlate with the type of catheter and if this needs to be at the IVC right atrial junction could be retracted 7 cm. Electronically Signed   By: Adrianna Horde M.D.   On: 10/12/2023 15:40   CT ABDOMEN PELVIS WO CONTRAST Result Date: 10/12/2023 CLINICAL DATA:  Abdominal pain and vomiting. Renal cancer status post partial nephrectomy. EXAM: CT ABDOMEN AND PELVIS WITHOUT CONTRAST TECHNIQUE: Multidetector CT imaging of the abdomen and pelvis was performed following the standard protocol without IV contrast. RADIATION DOSE REDUCTION: This exam was performed according to the departmental dose-optimization program which includes automated exposure control, adjustment of the mA and/or kV according to patient size and/or use of iterative reconstruction technique. COMPARISON:  CT abdomen pelvis dated 09/27/2023. FINDINGS: Evaluation of this exam is limited in the absence of intravenous contrast. Lower chest: The visualized lung bases are clear. There is calcification of the  mitral annulus. Right femoral venous catheter with tip in the right atrium. Partially visualized pericardial effusion measuring 12 mm in thickness. No intra-abdominal free air or free fluid. Hepatobiliary: The liver is unremarkable. No biliary ductal dilatation. The gallbladder is unremarkable. Pancreas: The pancreas is unremarkable as visualized. Spleen: Normal in size without focal abnormality. Adrenals/Urinary Tract: The adrenal glands unremarkable. Postsurgical changes of the upper pole of the right kidney. Small bilateral vascular calcifications. Faint 1 cm hypodense lesion in the upper pole of the left kidney is suboptimally characterized but similar to prior CT, likely a cyst. No hydronephrosis or obstructing stone. The visualized ureters and urinary bladder appear unremarkable. Stomach/Bowel: Thickened appearance of the rectum may be related to underdistention. There is no bowel obstruction or active inflammation. The appendix is normal. Vascular/Lymphatic: Mild aortoiliac atherosclerotic disease. The IVC is unremarkable. No portal venous gas. Small retroperitoneal and periaortic lymph nodes. No adenopathy. Reproductive: The uterus is anteverted. No suspicious adnexal masses. Other: Small fat containing right lower ventral hernia. No bowel herniation or fluid collection. Mild diffuse subcutaneous edema. Musculoskeletal: No acute osseous pathology. IMPRESSION: 1. No acute intra-abdominal or pelvic pathology. 2. Postsurgical changes of the upper pole of the right kidney. 3. Partially visualized pericardial effusion. 4.  Aortic Atherosclerosis (ICD10-I70.0). Electronically Signed   By: Angus Bark M.D.   On: 10/12/2023 12:36   ECHOCARDIOGRAM LIMITED Result Date: 10/06/2023    ECHOCARDIOGRAM LIMITED REPORT   Patient Name:   NIDIA FEATHER Date of Exam: 10/06/2023 Medical Rec #:  [id number]     Height:       65.0 in Accession #:    [id number]    Weight:       222.4 lb Date of Birth:  1980-11-26      BSA:           2.070 m Patient Age:    42 years      BP:           153/78 mmHg Patient Gender: F             HR:           90 bpm. Exam Location:  Inpatient Procedure: Limited Echo and Cardiac Doppler Indications:    pericardial effusion  History:        Patient has prior history of Echocardiogram examinations, most  recent 09/28/2023. Stroke; Risk Factors:Diabetes and Non-Smoker.  Sonographer:    Adelia Homestead RVT RCS Referring Phys: [id number] Banker [facility] IMPRESSIONS  1. Left ventricular ejection fraction, by estimation, is 60 to 65%. The left ventricle has normal function. The left ventricle has no regional wall motion abnormalities. There is mild left ventricular hypertrophy.  2. Right ventricular systolic function is normal. The right ventricular size is normal.  3. Left atrial size was mildly dilated.  4. Effusion is slightly large than TTE done 09/28/23 IVC is not dilated and collpases with inspiration with no RA/RV diastolic collapse no tamponade Now measures 2.4 cm lateral to LV posterior wall . moderate to large. The pericardial effusion is circumferential.  5. The mitral valve was not assessed. No evidence of mitral valve regurgitation. No evidence of mitral stenosis.  6. The aortic valve was not assessed. Aortic valve regurgitation is not visualized. No aortic stenosis is present.  7. The inferior vena cava is normal in size with greater than 50% respiratory variability, suggesting right atrial pressure of 3 mmHg. FINDINGS  Left Ventricle: Left ventricular ejection fraction, by estimation, is 60 to 65%. The left ventricle has normal function. The left ventricle has no regional wall motion abnormalities. The left ventricular internal cavity size was normal in size. There is  mild left ventricular hypertrophy. Right Ventricle: The right ventricular size is normal. No increase in right ventricular wall thickness. Right ventricular systolic function is normal. Left Atrium: Left atrial size was mildly dilated.  Right Atrium: Right atrial size was normal in size. Pericardium: Effusion is slightly large than TTE done 09/28/23 IVC is not dilated and collpases with inspiration with no RA/RV diastolic collapse no tamponade Now measures 2.4 cm lateral to LV posterior wall. Moderate to large. The pericardial effusion is circumferential. Mitral Valve: The mitral valve was not assessed. There is mild thickening of the mitral valve leaflet(s). There is mild calcification of the mitral valve leaflet(s). Mild mitral annular calcification. No evidence of mitral valve stenosis. Tricuspid Valve: The tricuspid valve is normal in structure. Tricuspid valve regurgitation is not demonstrated. No evidence of tricuspid stenosis. Aortic Valve: The aortic valve was not assessed. Aortic valve regurgitation is not visualized. No aortic stenosis is present. Pulmonic Valve: The pulmonic valve was not assessed. Pulmonic valve regurgitation is not visualized. No evidence of pulmonic stenosis. Aorta: The aortic root is normal in size and structure. Venous: The inferior vena cava is normal in size with greater than 50% respiratory variability, suggesting right atrial pressure of 3 mmHg. IAS/Shunts: The interatrial septum was not assessed. LEFT VENTRICLE PLAX 2D LVIDd:         4.90 cm LVIDs:         2.30 cm LV PW:         1.10 cm LV IVS:        1.20 cm  IVC IVC diam: 1.70 cm Janelle Mediate MD Electronically signed by Janelle Mediate MD Signature Date/Time: 10/06/2023/4:24:41 PM    Final    PERIPHERAL VASCULAR CATHETERIZATION Result Date: 10/05/2023 Images from the original result were not included.   Patient name: Yumeka Valentino    MRN: [id number]        DOB: 07/03/1981            Sex: female  10/02/2023 Pre-operative Diagnosis: Acute renal failure necessitating HD Post-operative diagnosis:  Same Surgeon:  [name], MD Procedure Performed:  Ultrasound-guided access of right common femoral vein Right femoral vein tunneled dialysis catheter placement  Indications:  This is a 43 y.o. female in need of urgent dialysis due to acute renal failure with anasarca and a pericardial effusion who has had multiple failures of her right IJ line despite multiple exchanges by IR. The imaging from her IR exchanges were reviewed which demonstrated excellent placement into the right atrium and is unclear why her mind continues to fail as she is also on therapeutic Lovenox  for history of DVT PE. Explained that our only remaining option is to place a femoral line.  Risks and benefits were reviewed with the patient and she elected to proceed.  Findings: Compressible right common femoral vein.  Adequately placed right femoral tunneled dialysis catheter into IVC             Procedure:  The patient was identified in the holding area and taken to the cath lab  The patient was then placed supine on the table and prepped and draped in the usual sterile fashion.  A time out was called.  Using ultrasound guidance the right femoral vein was accessed with micropuncture technique.  Through the micropuncture sheath, the guidewire was advanced into the inferior vena cava.  A small incision was made around the skin access point.  The access point was serially dilated under direct fluoroscopic guidance.  A peel-away sheath was introduced into the superior vena cava under fluoroscopic guidance.  A counterincision was made in the lateral aspect of the thigh.  A 55 cm tunnel dialysis catheter was then tunneled under the skin, into the right groin access site incision.  The tunneling device was removed and the catheter fed through the peel-away sheath into the inferior vena cava.  The peel-away sheath was removed and the catheter gently pulled back.  Adequate position was confirmed with x-ray.  The catheter was tested and found to aspirate and flush with ease.    The catheter was sutured to the skin and the groin incision was closed with 4-0 Monocryl.  The catheter was then capped and heparin  locked.   Contrast: none Sedation: none   [name] MD [facility] Office: [phone]   DG Knee 1-2 Views Right Result Date: 10/05/2023 CLINICAL DATA:  Fall, leg pain, swelling EXAM: RIGHT KNEE - 1-2 VIEW COMPARISON:  None Available. FINDINGS: Slight joint space narrowing. No acute bony abnormality. Specifically, no fracture, subluxation, or dislocation. No joint effusion. IMPRESSION: No acute bony abnormality. Electronically Signed   By: Janeece Mechanic M.D.   On: 10/05/2023 02:38   DG Knee 1-2 Views Left Result Date: 10/05/2023 CLINICAL DATA:  Fall, leg pain, swelling EXAM: LEFT KNEE - 1-2 VIEW COMPARISON:  None Available. FINDINGS: No acute bony abnormality. Specifically, no fracture, subluxation, or dislocation. Early degenerative changes with early joint space narrowing and spurring. IMPRESSION: No acute bony abnormality. Electronically Signed   By: Janeece Mechanic M.D.   On: 10/05/2023 02:38   DG Pelvis 1-2 Views Result Date: 10/05/2023 CLINICAL DATA:  Fall, leg swelling EXAM: PELVIS - 1-2 VIEW COMPARISON:  None Available. FINDINGS: There is no evidence of pelvic fracture or diastasis. No pelvic bone lesions are seen. Hip joints and SI joints symmetric. Right groin dialysis catheter partially visualized. IMPRESSION: No acute bony abnormality. Electronically Signed   By: Janeece Mechanic M.D.   On: 10/05/2023 02:37   CT HEAD WO CONTRAST ( ) Result Date: 10/05/2023 CLINICAL DATA:  Ataxia, head trauma fall EXAM: CT HEAD WITHOUT CONTRAST TECHNIQUE: Contiguous axial images were obtained from the base of  the skull through the vertex without intravenous contrast. RADIATION DOSE REDUCTION: This exam was performed according to the departmental dose-optimization program which includes automated exposure control, adjustment of the mA and/or kV according to patient size and/or use of iterative reconstruction technique. COMPARISON:  CT head January 25, 2021. MRI head January 26, 2021. FINDINGS:  Brain: Age indeterminate small perforator infarct in the left basal ganglia, new since January 26 2021. No evidence of acute hemorrhage, mass lesion, midline shift, or hydrocephalus. Vascular: No hyperdense vessel identified. Skull: No acute fracture. Sinuses/Orbits: Clear sinuses.  No acute orbital findings. Other: No mastoid effusions. IMPRESSION: 1. Age indeterminate small perforator infarct in the left basal ganglia, new since January 26 2021. If there is concern for acute infarct, MRI head could better assess. 2. Remote right basal ganglia perforator infarct. Electronically Signed   By: Stevenson Elbe M.D.   On: 10/05/2023 02:29   IR Fluoro Guide CV Line Right Result Date: 10/02/2023 INDICATION: 43 year old female with nonfunctional temporary right IJ hemodialysis catheter EXAM: IMAGE GUIDED EXCHANGE OF RIGHT IJ TEMPORARY HEMODIALYSIS CATHETER MEDICATIONS: NONE ANESTHESIA/SEDATION: Moderate (conscious) sedation was NOT employed during this procedure. FLUOROSCOPY: Radiation Exposure Index (as provided by the fluoroscopic device): 1.0 mGy Kerma COMPLICATIONS: None PROCEDURE: Informed written consent was obtained from the patient after a discussion of the risks, benefits, and alternatives to treatment. Questions regarding the procedure were encouraged and answered. The right neck and chest, including the indwelling catheter were prepped with chlorhexidine  in a sterile fashion, and a sterile drape was applied covering the operative field. Maximum barrier sterile technique with sterile gowns and gloves were used for the procedure. A timeout was performed prior to the initiation of the procedure. Scout image was acquired. Wire was passed through the catheter into the IVC under fluoroscopy. The suture was ligated at the neck. And the 20 cm catheter was exchanged for a 16 cm temporary triple-lumen hemodialysis catheter. Wire was removed and a final image was stored demonstrating that the catheter was present within  the upper right atrium. The catheter was flushed with appropriate volume heparin  dwells. 1% lidocaine  was used for local anesthesia. The catheter exit site was secured with a 0-Prolene retention suture. Dressings were applied. The patient tolerated the procedure well without immediate post procedural complication. IMPRESSION: Status post image guided exchange of right IJ temporary hemodialysis catheter. Signed, Marciano Settles. Rexine Cater, RPVI [facility] Electronically Signed   By: Myrlene Asper D.O.   On: 10/02/2023 09:51   IR Fluoro Guide CV Line Right Result Date: 10/02/2023 INDICATION: 44 year old female with history of acute kidney injury status post temporary hemodialysis catheter earlier the same day which was not functioning during hemodialysis. The patient presents for catheter check and exchange. EXAM: Temporary CENTRAL VENOUS HEMODIALYSIS CATHETER REPLACEMENT WITH FLUOROSCOPIC GUIDANCE MEDICATIONS: None FLUOROSCOPY TIME:  One mGy COMPLICATIONS: None immediate. PROCEDURE: Informed written consent was obtained from the patient after a discussion of the risks, benefits, and alternatives to treatment. Questions regarding the procedure were encouraged and answered. The skin and external portion of the existing hemodialysis catheter was prepped with chlorhexidine  in a sterile fashion, and a sterile drape was applied covering the operative field. Maximum barrier sterile technique with sterile gowns and gloves were used for the procedure. A timeout was performed prior to the initiation of the procedure. The central lumen of the indwelling hemodialysis catheter aspirated and flushed without difficulty. The red lumen would not aspirate. The blue lumen aspirated and flushed  without difficulty. Fluoroscopic evaluation demonstrated the catheter tip in the superior aspect of the right atrium. Local anesthetic was applied to the catheter entry site with 1%  lidocaine . The central lumen was cannulated with a Rosen wire was directed under fluoroscopic guidance to the inferior vena cava. The catheter was removed and exchanged for a new, 20 cm Trialysis catheter. The catheter tip was positioned in the inferior aspect of the right atrium. All 3 lumens flushed and aspirated without difficulty. The catheter was flushed with appropriate volume heparin  dwell as. The catheter was affixed to the skin with an interrupted 0 silk suture. A sterile bandage was applied. The patient tolerated the procedure well without immediate post procedural complication. IMPRESSION: Successful exchange of indwelling non tunneled hemodialysis catheter for 20 cm Trialysis catheter with tips terminating within the right atrium. The catheter is ready for immediate use. Creasie Doctor, MD [facility] Radiology Electronically Signed   By: Creasie Doctor M.D.   On: 10/02/2023 07:54   IR Fluoro Guide CV Line Right Result Date: 10/01/2023 CLINICAL DATA:  Acute kidney injury and need for hemodialysis. EXAM: NON-TUNNELED CENTRAL VENOUS HEMODIALYSIS CATHETER PLACEMENT WITH ULTRASOUND AND FLUOROSCOPIC GUIDANCE FLUOROSCOPY: 6 seconds.  3.0 mGy. PROCEDURE: The procedure, risks, benefits, and alternatives were explained to the patient. Questions regarding the procedure were encouraged and answered. The patient understands and consents to the procedure. A time-out was performed prior to initiating the procedure. The right neck and chest were prepped with chlorhexidine  in a sterile fashion, and a sterile drape was applied covering the operative field. Maximum barrier sterile technique with sterile gowns and gloves were used for the procedure. Local anesthesia was provided with 1% lidocaine . Ultrasound was used to confirm patency of the right internal jugular vein. An ultrasound image was saved recorded. After creating a small venotomy incision, a 21 gauge needle was  advanced into the right internal jugular vein under direct, real-time ultrasound guidance. Ultrasound image documentation was performed. After securing guidewire access venous access was dilated over the wire. A 16 cm, 13 French triple-lumen Mahurkar non tunneled hemodialysis catheter was advanced over the wire. Final catheter positioning was confirmed and documented with a fluoroscopic spot image. The catheter was aspirated, flushed with saline, and injected with appropriate volume heparin  dwells. The catheter exit site was secured with 0-Prolene retention sutures. COMPLICATIONS: None.  No pneumothorax. FINDINGS: After catheter placement, the tip lies in the right atrium. The catheter aspirates normally and is ready for immediate use. IMPRESSION: Placement of non-tunneled central venous hemodialysis catheter via the right internal jugular vein. The catheter tip lies in the right atrium. The catheter is ready for immediate use. Electronically Signed   By: Erica Hau M.D.   On: 10/01/2023 12:44   IR US  Guide Vasc Access Right Result Date: 10/01/2023 CLINICAL DATA:  Acute kidney injury and need for hemodialysis. EXAM: NON-TUNNELED CENTRAL VENOUS HEMODIALYSIS CATHETER PLACEMENT WITH ULTRASOUND AND FLUOROSCOPIC GUIDANCE FLUOROSCOPY: 6 seconds.  3.0 mGy. PROCEDURE: The procedure, risks, benefits, and alternatives were explained to the patient. Questions regarding the procedure were encouraged and answered. The patient understands and consents to the procedure. A time-out was performed prior to initiating the procedure. The right neck and chest were prepped with chlorhexidine  in a sterile fashion, and a sterile drape was applied covering the operative field. Maximum barrier sterile technique with sterile gowns and gloves were used for the procedure. Local anesthesia was provided with 1% lidocaine . Ultrasound was used to confirm patency of the  right internal jugular vein. An ultrasound image was saved recorded.  After creating a small venotomy incision, a 21 gauge needle was advanced into the right internal jugular vein under direct, real-time ultrasound guidance. Ultrasound image documentation was performed. After securing guidewire access venous access was dilated over the wire. A 16 cm, 13 French triple-lumen Mahurkar non tunneled hemodialysis catheter was advanced over the wire. Final catheter positioning was confirmed and documented with a fluoroscopic spot image. The catheter was aspirated, flushed with saline, and injected with appropriate volume heparin  dwells. The catheter exit site was secured with 0-Prolene retention sutures. COMPLICATIONS: None.  No pneumothorax. FINDINGS: After catheter placement, the tip lies in the right atrium. The catheter aspirates normally and is ready for immediate use. IMPRESSION: Placement of non-tunneled central venous hemodialysis catheter via the right internal jugular vein. The catheter tip lies in the right atrium. The catheter is ready for immediate use. Electronically Signed   By: Erica Hau M.D.   On: 10/01/2023 12:44   DG Chest Port 1 View Result Date: 09/30/2023 CLINICAL DATA:  Shortness of breath.  CHF. EXAM: PORTABLE CHEST 1 VIEW COMPARISON:  12/12/2020 FINDINGS: The cardio pericardial silhouette is enlarged. The lungs are clear without focal pneumonia, edema, pneumothorax or pleural effusion. No acute bony abnormality. IMPRESSION: Enlargement of the cardiopericardial silhouette without acute cardiopulmonary findings. Electronically Signed   By: Donnal Fusi M.D.   On: 09/30/2023 08:58   ECHOCARDIOGRAM COMPLETE Result Date: 09/28/2023    ECHOCARDIOGRAM REPORT   Patient Name:   TINESHA FINCH Date of Exam: 09/28/2023 Medical Rec #:  [id number]     Height:       65.0 in Accession #:    [id number]    Weight:       232.0 lb Date of Birth:  July 17, 1981      BSA:          2.107 m Patient Age:    42 years      BP:           165/81 mmHg Patient Gender: F             HR:            85 bpm. Exam Location:  Inpatient Procedure: 2D Echo, Cardiac Doppler and Color Doppler Indications:    abnormal ecg, pericardial effusion  History:        Patient has prior history of Echocardiogram examinations, most                 recent 12/04/2020. Risk Factors:Diabetes.  Sonographer:    Reta Cassis Referring Phys: [id number] CAROLE N HALL IMPRESSIONS  1. Left ventricular ejection fraction, by estimation, is 60 to 65%. The left ventricle has normal function. The left ventricle has no regional wall motion abnormalities. There is moderate concentric left ventricular hypertrophy. Left ventricular diastolic parameters are consistent with Grade I diastolic dysfunction (impaired relaxation). Elevated left ventricular end-diastolic pressure.  2. Right ventricular systolic function is normal. The right ventricular size is normal. There is normal pulmonary artery systolic pressure.  3. Left atrial size was severely dilated.  4. Pericardial effusion measuring up to 1.9 cm. Moderate pericardial effusion. There is no evidence of cardiac tamponade.  5. The mitral valve is normal in structure. No evidence of mitral valve regurgitation. No evidence of mitral stenosis.  6. The aortic valve is tricuspid. Aortic valve regurgitation is not visualized. No aortic stenosis is present.  7. The inferior vena cava is normal in size with  greater than 50% respiratory variability, suggesting right atrial pressure of 3 mmHg. FINDINGS  Left Ventricle: Left ventricular ejection fraction, by estimation, is 60 to 65%. The left ventricle has normal function. The left ventricle has no regional wall motion abnormalities. The left ventricular internal cavity size was normal in size. There is  moderate concentric left ventricular hypertrophy. Left ventricular diastolic parameters are consistent with Grade I diastolic dysfunction (impaired relaxation). Elevated left ventricular end-diastolic pressure. Right Ventricle: The right ventricular size is  normal. No increase in right ventricular wall thickness. Right ventricular systolic function is normal. There is normal pulmonary artery systolic pressure. The tricuspid regurgitant velocity is 2.08 m/s, and  with an assumed right atrial pressure of 3 mmHg, the estimated right ventricular systolic pressure is 20.3 mmHg. Left Atrium: Left atrial size was severely dilated. Right Atrium: Right atrial size was normal in size. Pericardium: Pericardial effusion measuring up to 1.9 cm. A moderately sized pericardial effusion is present. There is excessive respiratory variation in the tricuspid valve spectral Doppler velocities and diastolic collapse of the right atrial wall. There is no evidence of cardiac tamponade. Mitral Valve: The mitral valve is normal in structure. No evidence of mitral valve regurgitation. No evidence of mitral valve stenosis. MV peak gradient, 10.0 mmHg. The mean mitral valve gradient is 5.0 mmHg. Tricuspid Valve: The tricuspid valve is normal in structure. Tricuspid valve regurgitation is trivial. No evidence of tricuspid stenosis. Aortic Valve: The aortic valve is tricuspid. Aortic valve regurgitation is not visualized. No aortic stenosis is present. Aortic valve mean gradient measures 5.0 mmHg. Aortic valve peak gradient measures 8.1 mmHg. Aortic valve area, by VTI measures 2.48 cm. Pulmonic Valve: The pulmonic valve was normal in structure. Pulmonic valve regurgitation is not visualized. No evidence of pulmonic stenosis. Aorta: The aortic root is normal in size and structure. Venous: The inferior vena cava is normal in size with greater than 50% respiratory variability, suggesting right atrial pressure of 3 mmHg. IAS/Shunts: No atrial level shunt detected by color flow Doppler.  LEFT VENTRICLE PLAX 2D LVIDd:         4.80 cm   Diastology LVIDs:         2.90 cm   LV e' medial:    3.81 cm/s LV PW:         1.50 cm   LV E/e' medial:  32.8 LV IVS:        1.60 cm   LV e' lateral:   4.57 cm/s LVOT  diam:     2.00 cm   LV E/e' lateral: 27.4 LV SV:         80 LV SV Index:   38 LVOT Area:     3.14 cm  RIGHT VENTRICLE             IVC RV Basal diam:  4.10 cm     IVC diam: 1.80 cm RV S prime:     12.50 cm/s TAPSE (M-mode): 2.3 cm LEFT ATRIUM             Index        RIGHT ATRIUM           Index LA diam:        4.70 cm 2.23 cm/m   RA Area:     16.70 cm LA Vol (A2C):   87.1 ml 41.34 ml/m  RA Volume:   40.30 ml  19.13 ml/m LA Vol (A4C):   61.3 ml 29.09 ml/m LA Biplane Vol: 79.9 ml  37.92 ml/m  AORTIC VALVE AV Area (Vmax):    2.52 cm AV Area (Vmean):   2.19 cm AV Area (VTI):     2.48 cm AV Vmax:           142.00 cm/s AV Vmean:          111.000 cm/s AV VTI:            0.323 m AV Peak Grad:      8.1 mmHg AV Mean Grad:      5.0 mmHg LVOT Vmax:         114.00 cm/s LVOT Vmean:        77.400 cm/s LVOT VTI:          0.255 m LVOT/AV VTI ratio: 0.79  AORTA Ao Root diam: 2.80 cm Ao Asc diam:  3.20 cm MITRAL VALVE                TRICUSPID VALVE MV Area (PHT): 2.91 cm     TR Peak grad:   17.3 mmHg MV Area VTI:   2.23 cm     TR Vmax:        208.00 cm/s MV Peak grad:  10.0 mmHg MV Mean grad:  5.0 mmHg     SHUNTS MV Vmax:       1.58 m/s     Systemic VTI:  0.26 m MV Vmean:      104.0 cm/s   Systemic Diam: 2.00 cm MV Decel Time: 261 msec MV E velocity: 125.00 cm/s MV A velocity: 140.00 cm/s MV E/A ratio:  0.89 Maudine Sos MD Electronically signed by Maudine Sos MD Signature Date/Time: 09/28/2023/3:42:48 PM    Final    VAS US  LOWER EXTREMITY VENOUS (DVT) Result Date: 09/28/2023  Lower Venous DVT Study Patient Name:  TRYNITY RATAJCZAK  Date of Exam:   09/28/2023 Medical Rec #: [id number]      Accession #:    [id number] Date of Birth: [birth year]       Patient Gender: F Patient Age:   58 years Exam Location:  [facility] Procedure:      VAS US  LOWER EXTREMITY VENOUS (DVT) Referring Phys: Kennie Peach HALL --------------------------------------------------------------------------------  Indications: Swelling, and Edema.   Risk Factors: Hx of PE obesity. Limitations: Body habitus. Comparison Study: Significant changes seen since previous exam 05/22/18. Performing Technologist: Estanislao Heimlich  Examination Guidelines: A complete evaluation includes B-mode imaging, spectral Doppler, color Doppler, and power Doppler as needed of all accessible portions of each vessel. Bilateral testing is considered an integral part of a complete examination. Limited examinations for reoccurring indications may be performed as noted. The reflux portion of the exam is performed with the patient in reverse Trendelenburg.  +---------+---------------+---------+-----------+----------+-------------------+ RIGHT    CompressibilityPhasicitySpontaneityPropertiesThrombus Aging      +---------+---------------+---------+-----------+----------+-------------------+ CFV      Full           Yes      Yes                                      +---------+---------------+---------+-----------+----------+-------------------+ SFJ      Full                                                             +---------+---------------+---------+-----------+----------+-------------------+  FV Prox  Full                                                             +---------+---------------+---------+-----------+----------+-------------------+ FV Mid   Full                                                             +---------+---------------+---------+-----------+----------+-------------------+ FV DistalFull                                                             +---------+---------------+---------+-----------+----------+-------------------+ PFV      Full                                                             +---------+---------------+---------+-----------+----------+-------------------+ POP      Full           Yes      Yes                                       +---------+---------------+---------+-----------+----------+-------------------+ PTV                              Yes                  Not well visualized +---------+---------------+---------+-----------+----------+-------------------+ PERO                             Yes                  Not well visualized +---------+---------------+---------+-----------+----------+-------------------+   +---------+---------------+---------+-----------+----------+-------------------+ LEFT     CompressibilityPhasicitySpontaneityPropertiesThrombus Aging      +---------+---------------+---------+-----------+----------+-------------------+ CFV      Full           Yes      Yes                                      +---------+---------------+---------+-----------+----------+-------------------+ SFJ      Full                                                             +---------+---------------+---------+-----------+----------+-------------------+ FV Prox  Full                                                             +---------+---------------+---------+-----------+----------+-------------------+  FV Mid   Full                                                             +---------+---------------+---------+-----------+----------+-------------------+ FV DistalFull                                                             +---------+---------------+---------+-----------+----------+-------------------+ PFV      Full                                                             +---------+---------------+---------+-----------+----------+-------------------+ POP      Full           Yes      Yes                                      +---------+---------------+---------+-----------+----------+-------------------+ PTV                              Yes                  Not well visualized +---------+---------------+---------+-----------+----------+-------------------+  PERO                             Yes                  Not well visualized +---------+---------------+---------+-----------+----------+-------------------+     Summary: BILATERAL: - No evidence of deep vein thrombosis seen in the lower extremities, bilaterally. -No evidence of popliteal cyst, bilaterally.   *See table(s) above for measurements and observations. Electronically signed by Delaney Fearing on 09/28/2023 at 10:51:02 AM.    Final    CT ABDOMEN PELVIS WO CONTRAST Result Date: 09/28/2023 CLINICAL DATA:  Provided history: Kidney cancer, post nephrectomy, monitor EXAM: CT ABDOMEN AND PELVIS WITHOUT CONTRAST TECHNIQUE: Multidetector CT imaging of the abdomen and pelvis was performed following the standard protocol without IV contrast. RADIATION DOSE REDUCTION: This exam was performed according to the departmental dose-optimization program which includes automated exposure control, adjustment of the mA and/or kV according to patient size and/or use of iterative reconstruction technique. COMPARISON:  Renal ultrasound 06/15/2023, CT 11/08/2019 FINDINGS: Lower chest: The heart is mildly enlarged. Moderate circumferential pericardial effusion measuring up to 2.1 cm adjacent to the right ventricle. Mitral annulus calcifications. No pleural effusion. Hepatobiliary: No evidence of focal liver lesion on this unenhanced exam. The liver is enlarged spanning 21.3 cm cranial caudal. Gallbladder physiologically distended, no calcified stone. No biliary dilatation. Pancreas: No ductal dilatation or inflammation. Spleen: Normal in size without focal abnormality. Adrenals/Urinary Tract: Normal adrenal glands. Contour deformity with probable postsurgical change in the upper right kidney. Simple appearing cyst in the mid right kidney, simple fluid density. No evidence of solid lesion.  No hydronephrosis or renal calculi of either kidney. Decompressed ureters. Mild wall thickening about the anterior bladder. Stomach/Bowel:  Unremarkable appearance of the stomach. Occasional fluid-filled small bowel in the pelvis. No obstruction or inflammation. Colonic redundancy with small to moderate volume of stool in the colon. Vascular/Lymphatic: Normal caliber abdominal aorta. Mild but age advanced aortic atherosclerosis. No enlarged lymph nodes. Prominent ileocolic node at 9 mm is unchanged from prior CT. Reproductive: Suspected anterior fundal fibroid. Ovaries are not well-defined. No suspicious adnexal mass. Other: Small volume abdominopelvic ascites. Right lower ventral abdominal wall hernia containing fat and small amount of ascites. There is generalized subcutaneous body wall edema. Musculoskeletal: No focal bone lesion.  No acute osseous finding. IMPRESSION: 1. Postsurgical change in the upper right kidney. No evidence of recurrent or metastatic disease in the abdomen/pelvis. 2. Hepatomegaly. 3. Small volume abdominopelvic ascites. Generalized subcutaneous body wall edema. 4. Moderate circumferential pericardial effusion. 5. Mild wall thickening about the anterior bladder, may be due to underdistention or cystitis. Recommend correlation with urinalysis. 6. Right lower ventral abdominal wall hernia containing fat and small amount of ascites. Aortic Atherosclerosis (ICD10-I70.0). Electronically Signed   By: Chadwick Colonel M.D.   On: 09/28/2023 02:58     Subjective: Patient seen examined bedside, resting calmly.  Sitting at edge of bed.  No complaints this morning.  Had hemodialysis earlier this morning and tolerated with pretreatment with Pepcid /Benadryl .  Also requesting gabapentin  for home use as this is remarkably helped her pruritus and generalized pain.  No other specific questions, concerns or complaints at this time.  Denies headache, no dizziness, no chest pain, no palpitations, no shortness of breath, no abdominal pain, no fever/chills/night sweats, no nausea cefonicid diarrhea, no focal weakness, no fatigue, no paresthesia.   No acute events overnight per nursing staff.  Discharge Exam: Vitals:   10/14/23 0600 10/14/23 0751  BP: 139/76 122/68  Pulse: 98   Resp: 16 18  Temp: 98.2 F (36.8 C) 98.5 F (36.9 C)  SpO2: 100% 100%   Vitals:   10/14/23 0500 10/14/23 0530 10/14/23 0600 10/14/23 0751  BP: (!) 143/62 (!) 151/66 139/76 122/68  Pulse: 95 96 98   Resp: 18 (!) 22 16 18   Temp:   98.2 F (36.8 C) 98.5 F (36.9 C)  TempSrc:   Oral Oral  SpO2: 99% 100% 100% 100%  Weight:      Height:        Physical Exam: GEN: NAD, alert and oriented x 3, chronically ill appearance, obese HEENT: NCAT, PERRL, EOMI, sclera clear, MMM PULM: CTAB w/o wheezes/crackles, normal respiratory effort, on room air CV: RRR w/o M/G/R GI: abd soft, NTND, NABS, no R/G/M MSK: 2+ bilateral lower extremity peripheral edema, muscle strength globally intact 5/5 bilateral upper/lower extremities, right femoral TDC catheter noted in place NEURO: CN II-XII intact, no focal deficits, sensation to light touch intact PSYCH: normal mood/affect Integumentary: dry/intact, no rashes or wounds    The results of significant diagnostics from this hospitalization (including imaging, microbiology, ancillary and laboratory) are listed below for reference.     Microbiology: Recent Results (from the past 240 hours)  Culture, blood (routine x 2)     Status: None (Preliminary result)   Collection Time: 10/12/23  3:28 PM   Specimen: BLOOD  Result Value Ref Range Status   Specimen Description BLOOD RIGHT ANTECUBITAL  Final   Special Requests   Final    BOTTLES DRAWN AEROBIC AND ANAEROBIC Blood Culture results may not be optimal due  to an inadequate volume of blood received in culture bottles   Culture   Final    NO GROWTH 2 DAYS Performed at [facility], [address], [place], [place] [address]    Report Status PENDING  Incomplete  MRSA Next Gen by PCR, Nasal     Status: None   Collection Time: 10/13/23  8:44 PM   Specimen: Nasal  Mucosa; Nasal Swab  Result Value Ref Range Status   MRSA by PCR Next Gen NOT DETECTED NOT DETECTED Final    Comment: (NOTE) The GeneXpert MRSA Assay (FDA approved for NASAL specimens only), is one component of a comprehensive MRSA colonization surveillance program. It is not intended to diagnose MRSA infection nor to guide or monitor treatment for MRSA infections. Test performance is not FDA approved in patients less than 56 years old. Performed at [facility], [address], [place], [place] [address]      Labs: BNP (last 3 results) Recent Labs    10/01/23 0447 10/02/23 0616 10/03/23 0334  BNP 590.8* 509.8* 490.9*   Basic Metabolic Panel: Recent Labs  Lab 10/12/23 0858 10/13/23 0328 10/14/23 0847  NA 138 140 133*  K 4.1 4.6 3.9  CL 105 106 100  CO2 22 24 29   GLUCOSE 166* 115* 127*  BUN 27* 31* 17  CREATININE 7.12* 7.60* 4.76*  CALCIUM  7.6* 7.4* 7.1*  PHOS  --  6.8* 4.0   Liver Function Tests: Recent Labs  Lab 10/12/23 0858 10/13/23 0328 10/14/23 0847  AST 19  --   --   ALT 17  --   --   ALKPHOS 98  --   --   BILITOT 0.7  --   --   PROT 5.2*  --   --   ALBUMIN  2.0* 1.8* 1.9*   Recent Labs  Lab 10/12/23 0858  LIPASE 22   No results for input(s): "AMMONIA" in the last 168 hours. CBC: Recent Labs  Lab 10/12/23 0938 10/12/23 1528 10/13/23 0328 10/14/23 0847  WBC 23.6*  --  18.8* 18.3*  NEUTROABS 18.3*  --  12.7*  --   HGB 9.5* 9.9* 7.7* 7.5*  HCT 31.0* 32.4* 24.4* 23.7*  MCV 95.7  --  93.5 92.9  PLT 180  --  162 173   Cardiac Enzymes: No results for input(s): "CKTOTAL", "CKMB", "CKMBINDEX", "TROPONINI" in the last 168 hours. BNP: Invalid input(s): "POCBNP" CBG: Recent Labs  Lab 10/13/23 1121 10/13/23 1624 10/13/23 2033 10/14/23 0754 10/14/23 0817  GLUCAP 127* 199* 77 99 136*   D-Dimer No results for input(s): "DDIMER" in the last 72 hours. Hgb A1c No results for input(s): "HGBA1C" in the last 72 hours. Lipid Profile No  results for input(s): "CHOL", "HDL", "LDLCALC", "TRIG", "CHOLHDL", "LDLDIRECT" in the last 72 hours. Thyroid  function studies No results for input(s): "TSH", "T4TOTAL", "T3FREE", "THYROIDAB" in the last 72 hours.  Invalid input(s): "FREET3" Anemia work up No results for input(s): "VITAMINB12", "FOLATE", "FERRITIN", "TIBC", "IRON", "RETICCTPCT" in the last 72 hours. Urinalysis    Component Value Date/Time   COLORURINE YELLOW 10/06/2023 0546   APPEARANCEUR TURBID (A) 10/06/2023 0546   LABSPEC 1.013 10/06/2023 0546   PHURINE 5.0 10/06/2023 0546   GLUCOSEU NEGATIVE 10/06/2023 0546   HGBUR LARGE (A) 10/06/2023 0546   BILIRUBINUR NEGATIVE 10/06/2023 0546   KETONESUR NEGATIVE 10/06/2023 0546   PROTEINUR 100 (A) 10/06/2023 0546   UROBILINOGEN 1.0 08/14/2009 0850   NITRITE NEGATIVE 10/06/2023 0546   LEUKOCYTESUR MODERATE (A) 10/06/2023  0546   Sepsis Labs Recent Labs  Lab 10/12/23 0938 10/13/23 0328 10/14/23 0847  WBC 23.6* 18.8* 18.3*   Microbiology Recent Results (from the past 240 hours)  Culture, blood (routine x 2)     Status: None (Preliminary result)   Collection Time: 10/12/23  3:28 PM   Specimen: BLOOD  Result Value Ref Range Status   Specimen Description BLOOD RIGHT ANTECUBITAL  Final   Special Requests   Final    BOTTLES DRAWN AEROBIC AND ANAEROBIC Blood Culture results may not be optimal due to an inadequate volume of blood received in culture bottles   Culture   Final    NO GROWTH 2 DAYS Performed at [facility], [address], [place], [place] [address]    Report Status PENDING  Incomplete  MRSA Next Gen by PCR, Nasal     Status: None   Collection Time: 10/13/23  8:44 PM   Specimen: Nasal Mucosa; Nasal Swab  Result Value Ref Range Status   MRSA by PCR Next Gen NOT DETECTED NOT DETECTED Final    Comment: (NOTE) The GeneXpert MRSA Assay (FDA approved for NASAL specimens only), is one component of a comprehensive MRSA colonization surveillance program. It  is not intended to diagnose MRSA infection nor to guide or monitor treatment for MRSA infections. Test performance is not FDA approved in patients less than 76 years old. Performed at [facility], [address] [place], [place] [address]      Time coordinating discharge: Over 30 minutes  SIGNED:   Rema Care [place], DO  [facility] 10/14/2023, 10:24 AM

## 2023-10-17 LAB — CULTURE, BLOOD (ROUTINE X 2): Culture: NO GROWTH

## 2023-12-21 ENCOUNTER — Other Ambulatory Visit: Payer: Self-pay

## 2023-12-21 DIAGNOSIS — N186 End stage renal disease: Secondary | ICD-10-CM

## 2023-12-29 ENCOUNTER — Other Ambulatory Visit: Payer: Self-pay

## 2023-12-29 ENCOUNTER — Emergency Department (HOSPITAL_COMMUNITY)
Admission: EM | Admit: 2023-12-29 | Discharge: 2023-12-29 | Disposition: A | Discharge status: Home / Self Care | Attending: Emergency Medicine | Admitting: Emergency Medicine

## 2023-12-29 ENCOUNTER — Encounter (HOSPITAL_COMMUNITY): Payer: Self-pay

## 2023-12-29 DIAGNOSIS — R739 Hyperglycemia, unspecified: Secondary | ICD-10-CM | POA: Insufficient documentation

## 2023-12-29 DIAGNOSIS — Z7901 Long term (current) use of anticoagulants: Secondary | ICD-10-CM | POA: Insufficient documentation

## 2023-12-29 DIAGNOSIS — I509 Heart failure, unspecified: Secondary | ICD-10-CM | POA: Insufficient documentation

## 2023-12-29 DIAGNOSIS — Z992 Dependence on renal dialysis: Secondary | ICD-10-CM | POA: Insufficient documentation

## 2023-12-29 DIAGNOSIS — N186 End stage renal disease: Secondary | ICD-10-CM | POA: Insufficient documentation

## 2023-12-29 LAB — BASIC METABOLIC PANEL WITH GFR
Anion gap: 11 (ref 5–15)
BUN: 49 mg/dL — ABNORMAL HIGH (ref 6–20)
CO2: 22 mmol/L (ref 22–32)
Calcium: 9 mg/dL (ref 8.9–10.3)
Chloride: 97 mmol/L — ABNORMAL LOW (ref 98–111)
Creatinine, Ser: 7.79 mg/dL — ABNORMAL HIGH (ref 0.44–1.00)
GFR, Estimated: 6 mL/min — ABNORMAL LOW (ref >60)
Glucose, Bld: 460 mg/dL — ABNORMAL HIGH (ref 70–99)
Potassium: 5.8 mmol/L — ABNORMAL HIGH (ref 3.5–5.1)
Sodium: 130 mmol/L — ABNORMAL LOW (ref 135–145)

## 2023-12-29 LAB — CBC WITH DIFFERENTIAL/PLATELET
Abs Immature Granulocytes: 0.03 10*3/uL (ref 0.00–0.07)
Basophils Absolute: 0 10*3/uL (ref 0.0–0.1)
Basophils Relative: 0 %
Eosinophils Absolute: 0.3 10*3/uL (ref 0.0–0.5)
Eosinophils Relative: 3 %
HCT: 35 % — ABNORMAL LOW (ref 36.0–46.0)
Hemoglobin: 11.2 g/dL — ABNORMAL LOW (ref 12.0–15.0)
Immature Granulocytes: 0 %
Lymphocytes Relative: 12 %
Lymphs Abs: 1.1 10*3/uL (ref 0.7–4.0)
MCH: 27.7 pg (ref 26.0–34.0)
MCHC: 32 g/dL (ref 30.0–36.0)
MCV: 86.6 fL (ref 80.0–100.0)
Monocytes Absolute: 0.5 10*3/uL (ref 0.1–1.0)
Monocytes Relative: 5 %
Neutro Abs: 7.7 10*3/uL (ref 1.7–7.7)
Neutrophils Relative %: 80 %
Platelets: 212 10*3/uL (ref 150–400)
RBC: 4.04 MIL/uL (ref 3.87–5.11)
RDW: 14.2 % (ref 11.5–15.5)
WBC: 9.6 10*3/uL (ref 4.0–10.5)
nRBC: 0 % (ref 0.0–0.2)

## 2023-12-29 LAB — I-STAT CHEM 8, ED
BUN: 46 mg/dL — ABNORMAL HIGH (ref 6–20)
Calcium, Ion: 1 mmol/L — ABNORMAL LOW (ref 1.15–1.40)
Chloride: 98 mmol/L (ref 98–111)
Creatinine, Ser: 9 mg/dL — ABNORMAL HIGH (ref 0.44–1.00)
Glucose, Bld: 435 mg/dL — ABNORMAL HIGH (ref 70–99)
HCT: 36 % (ref 36.0–46.0)
Hemoglobin: 12.2 g/dL (ref 12.0–15.0)
Potassium: 5.7 mmol/L — ABNORMAL HIGH (ref 3.5–5.1)
Sodium: 128 mmol/L — ABNORMAL LOW (ref 135–145)
TCO2: 22 mmol/L (ref 22–32)

## 2023-12-29 MED ORDER — CEPHALEXIN 500 MG PO CAPS: 500.0000 mg | ORAL_CAPSULE | Freq: Four times a day (QID) | ORAL | 0 refills | Status: DC

## 2023-12-29 MED ORDER — DOXYCYCLINE HYCLATE 100 MG PO CAPS
100.0000 mg | ORAL_CAPSULE | Freq: Two times a day (BID) | ORAL | 0 refills | Status: DC
Start: 2023-12-29 — End: 2023-12-29

## 2023-12-29 MED ORDER — SODIUM ZIRCONIUM CYCLOSILICATE 10 G PO PACK
10.0000 g | PACK | Freq: Once | ORAL | Status: AC
  Administered 2023-12-29: 10 g via ORAL
  Filled 2023-12-29: qty 1

## 2023-12-29 MED ORDER — LOKELMA 10 G PO PACK: 10.0000 g | PACK | Freq: Every day | ORAL | 0 refills | Status: AC

## 2023-12-29 MED ORDER — DOXYCYCLINE HYCLATE 100 MG PO CAPS: 100.0000 mg | ORAL_CAPSULE | Freq: Two times a day (BID) | ORAL | 0 refills | Status: AC

## 2023-12-29 MED ORDER — CEPHALEXIN 500 MG PO CAPS: 500.0000 mg | ORAL_CAPSULE | Freq: Three times a day (TID) | ORAL | 0 refills | Status: DC

## 2023-12-29 MED ORDER — LOKELMA 10 G PO PACK: 10.0000 g | PACK | Freq: Every day | ORAL | 0 refills | Status: DC

## 2023-12-29 NOTE — Discharge Instructions (Addendum)
 Call your dialysis center and go in tomorrow for dialysis since you are missing a dose today.   Take Doxycycline two times a day for the next week to prevent infection at the catheter site since we did have to re-insert it. Take Lokelma tonight and tomorrow before dialysis for your elevated potassium levels.  Take your insulin when you get home.  Get help right away if: Your blood sugar monitor reads "high" even when you're taking insulin. You have trouble breathing.

## 2023-12-29 NOTE — ED Triage Notes (Signed)
 Pt has dialysis cath that got pulled out this morning. Bleeding controlled. Pt did not get dialysis this morning. Axox4.

## 2023-12-29 NOTE — ED Provider Notes (Signed)
 [place] EMERGENCY DEPARTMENT AT [facility] Provider Note   CSN: [id number] Arrival date & time: 12/29/23  1117     History  Chief Complaint  Patient presents with   Vascular Access Problem    Sherri Dixon is a 43 y.o. female with a history of ESRD on dialysis, CHF, and CVA who presents the ED today for a problem with her dialysis catheter.  Patient states that she woke up this morning and noticed her dialysis catheter in her right thigh was coming out when she woke up this morning with some bleeding that the.  She was unable to get it back in herself.  She skipped dialysis this morning in order to come here. Did not take her morning medication. No chest pain or shortness of breath. Denies any additional complaints or concerns at this time.    Home Medications Prior to Admission medications   Medication Sig Start Date End Date Taking? Authorizing Provider  acetaminophen (TYLENOL) 650 MG CR tablet Take 650 mg by mouth once as needed for pain.    [provider]  apixaban (ELIQUIS) 5 MG TABS tablet Take 1 tablet (5 mg total) by mouth 2 (two) times daily. 10/06/23   Leroy Sea, MD  Blood Glucose Monitoring Suppl (BLOOD GLUCOSE MONITOR SYSTEM) w/Device KIT Use as directed in the morning, at noon, and at bedtime. 10/06/23   Leroy Sea, MD  carvedilol (COREG) 12.5 MG tablet Take 1 tablet (12.5 mg total) by mouth 2 (two) times daily with a meal. 10/06/23   Leroy Sea, MD  Continuous Blood Gluc Sensor (DEXCOM G6 SENSOR) MISC by Does not apply route. Patient not taking: Reported on 11/13/2022 08/13/22   [provider]  diphenhydrAMINE (BENADRYL) 25 mg capsule Take 1 capsule (25 mg total) by mouth 3 (three) times a week for 90 doses. Prior to dialysis 10/14/23 05/10/24  [place], Alvira Philips, DO  doxycycline (VIBRAMYCIN) 100 MG capsule Take 1 capsule (100 mg total) by mouth 2 (two) times daily for 7 days. 12/29/23 01/05/24  Maxwell Marion, PA-C  famotidine (PEPCID)  20 MG tablet Take 1 tablet (20 mg total) by mouth 3 (three) times a week. Prior to dialysis 10/14/23   [place], Alvira Philips, DO  furosemide (LASIX) 80 MG tablet Take 1 tablet (80 mg total) by mouth daily on your nondialysis days which will be Wednesday, Saturday and Sunday 10/06/23   Leroy Sea, MD  gabapentin (NEURONTIN) 100 MG capsule Take 1 capsule (100 mg total) by mouth at bedtime. 10/14/23 01/12/24  [place], Eric J, DO  hydrALAZINE (APRESOLINE) 100 MG tablet Take 1 tablet (100 mg total) by mouth every 8 (eight) hours. 10/06/23   Leroy Sea, MD  insulin aspart (NOVOLOG) 100 UNIT/ML FlexPen Use as directed before each meal 3 times a day, 140-199 - 2 units, 200-250 - 4 units, 251-299 - 6 units,  300-349 - 8 units,  350 or above 10 units. 10/06/23   Leroy Sea, MD  Insulin Pen Needle 32G X 4 MM MISC Use as directed 4 times a day for subcutaneous insulin injection. 10/06/23   Leroy Sea, MD  isosorbide mononitrate (IMDUR) 30 MG 24 hr tablet Take by mouth. 08/05/22   [provider]  nitroGLYCERIN (NITROSTAT) 0.4 MG SL tablet Take by mouth. 04/17/19   [provider]  omeprazole (PRILOSEC) 20 MG capsule TAKE 1 CAPSULE (20 MG TOTAL) BY MOUTH 2 (TWO) TIMES DAILY BEFORE A MEAL. 04/14/23   Clent Ridges,  Earnstine Regal, NP  ondansetron (ZOFRAN-ODT) 4 MG disintegrating tablet Take 4 mg by mouth once as needed for vomiting or nausea. 10/08/23   [provider]  rosuvastatin (CRESTOR) 10 MG tablet Take 1 tablet (10 mg total) by mouth daily. 10/15/23   [place], Alvira Philips, DO  sodium zirconium cyclosilicate (LOKELMA) 10 g PACK packet Take 10 g by mouth daily for 2 days. 12/29/23 12/31/23  Maxwell Marion, PA-C  VITAMIN D, CHOLECALCIFEROL, PO Take 1 capsule by mouth daily. Patient takes 1 gummy daily. 10/12/2023.    [provider]      Allergies    Amlodipine, Insulin glargine, and Humalog kwikpen [insulin lispro]    Review of Systems   Review of Systems  Hematological:         Dialysis catheter concern  All other systems reviewed and are negative.   Physical Exam Updated Vital Signs BP (!) 199/98   Pulse 72   Temp 98.8 F (37.1 C) (Oral)   Resp 15   Ht 5\' 5"  (1.651 m)   Wt 94 kg   LMP 12/10/2023   SpO2 100%   BMI 34.49 kg/m  Physical Exam Vitals and nursing note reviewed.  Constitutional:      General: She is not in acute distress.    Appearance: Normal appearance.  HENT:     Head: Normocephalic and atraumatic.     Mouth/Throat:     Mouth: Mucous membranes are moist.  Eyes:     Conjunctiva/sclera: Conjunctivae normal.     Pupils: Pupils are equal, round, and reactive to light.  Cardiovascular:     Rate and Rhythm: Normal rate and regular rhythm.     Pulses: Normal pulses.     Heart sounds: Normal heart sounds.  Pulmonary:     Effort: Pulmonary effort is normal.     Breath sounds: Normal breath sounds.  Abdominal:     Palpations: Abdomen is soft.     Tenderness: There is no abdominal tenderness.  Musculoskeletal:        General: Normal range of motion.     Cervical back: Normal range of motion.  Skin:    General: Skin is warm and dry.     Findings: No rash.     Comments: Right thigh dialysis catheter is slightly pulled out without active bleeding, erythema, or warmth to touch at catheter site.  Was able to get the catheter back in without complications.  Neurological:     General: No focal deficit present.     Mental Status: She is alert.  Psychiatric:        Mood and Affect: Mood normal.        Behavior: Behavior normal.    ED Results / Procedures / Treatments   Labs (all labs ordered are listed, but only abnormal results are displayed) Labs Reviewed  CBC WITH DIFFERENTIAL/PLATELET - Abnormal; Notable for the following components:      Result Value   Hemoglobin 11.2 (*)    HCT 35.0 (*)    All other components within normal limits  BASIC METABOLIC PANEL WITH GFR - Abnormal; Notable for the following components:   Sodium 130  (*)    Potassium 5.8 (*)    Chloride 97 (*)    Glucose, Bld 460 (*)    BUN 49 (*)    Creatinine, Ser 7.79 (*)    GFR, Estimated 6 (*)    All other components within normal limits  I-STAT CHEM 8, ED - Abnormal; Notable  for the following components:   Sodium 128 (*)    Potassium 5.7 (*)    BUN 46 (*)    Creatinine, Ser 9.00 (*)    Glucose, Bld 435 (*)    Calcium, Ion 1.00 (*)    All other components within normal limits    EKG None  Radiology No results found.  Procedures Procedures: not indicated.   Medications Ordered in ED Medications  sodium zirconium cyclosilicate (LOKELMA) packet 10 g (10 g Oral Given 12/29/23 1517)    ED Course/ Medical Decision Making/ A&P                                 Medical Decision Making Amount and/or Complexity of Data Reviewed Labs: ordered.  Risk Prescription drug management.   This patient presents to the ED for concern of dialysis catheter concern, this involves an extensive number of treatment options, and is a complaint that carries with it a high risk of complications and morbidity.   Differential diagnosis includes: occlusion, catheter came out, infection, etc.   Comorbidities  See HPI above   Additional History  Additional history obtained from prior records   Cardiac Monitoring / EKG  The patient was maintained on a cardiac monitor.  I personally viewed and interpreted the cardiac monitored which showed: Sinus rhythm with borderline QT prolongation with a heart rate of 72 bpm. Rhythm seen on prior EKGs   Lab Tests  I ordered and personally interpreted labs.  The pertinent results include:   BMP has potassium of 5.8, otherwise within normal limits for patient CBC is reassuring   Consultations  I requested consultation with Dr. Arlean Hopping,  and discussed lab and imaging findings as well as pertinent plan - they recommend: Check metabolic panel to assess potassium level. Start patient on oral antibiotics to go  home with.   Problem List / ED Course / Critical Interventions / Medication Management  Patient states that her right thigh dialysis catheter started coming out this morning.  States that she woke up and it was somewhat removed from the initial placement was some blood on her leg.  Denies any pain at the site.  Try to put it back in herself but could not. Was scheduled to have dialysis this morning but missed it to come here.  Did not take her morning medications prior to arrival. Patient staffed with my attending, Dr. Dalene Seltzer. I ordered medications including: Lokelma for hyperkalemia  I have reviewed the patients home medicines and have made adjustments as needed Prescription for doxycycline sent to the pharmacy for MRSA coverage as well as Lokelma to take tonight and tomorrow prior to dialysis.  Patient instructed to go to dialysis tomorrow. She does not wish to get fluids or insulin here in the ED.  She wants to go home and take her medications.   Social Determinants of Health  Access to healthcare   Test / Admission - Considered  Patient is stable and safe discharge home. Strict return precautions given.       Final Clinical Impression(s) / ED Diagnoses Final diagnoses:  ESRD (end stage renal disease) (HCC)  Hyperglycemia    Rx / DC Orders ED Discharge Orders          Ordered    cephALEXin (KEFLEX) 500 MG capsule  3 times daily,   Status:  Discontinued        12/29/23 1408    cephALEXin (  KEFLEX) 500 MG capsule  4 times daily,   Status:  Discontinued        12/29/23 1411    doxycycline (VIBRAMYCIN) 100 MG capsule  2 times daily,   Status:  Discontinued        12/29/23 1448    sodium zirconium cyclosilicate (LOKELMA) 10 g PACK packet  Daily,   Status:  Discontinued        12/29/23 1449    doxycycline (VIBRAMYCIN) 100 MG capsule  2 times daily        12/29/23 1520    sodium zirconium cyclosilicate (LOKELMA) 10 g PACK packet  Daily        12/29/23 1520               Maxwell Marion, PA-C 12/29/23 1608    Alvira Monday, MD 12/30/23 1258

## 2023-12-30 NOTE — Progress Notes (Unsigned)
 Patient ID: Sherri Dixon, female   DOB: March 20, 1981, 43 y.o.   MRN: [id number]  Reason for Consult: No chief complaint on file.   Referred by Sherri Miss, MD  Subjective:     HPI  Sherri Dixon is a 43 y.o. female who presents for evaluation HD access creation.  Past Medical History: No date: Anemia No date: Asthma No date: CHF (congestive heart failure) (HCC)     Comment:  Pt reports that father has CHF, not her No date: Diabetes mellitus without complication (HCC) No date: Dyslipidemia No date: GERD (gastroesophageal reflux disease) No date: Pulmonary embolism (HCC) No date: Stroke [facility])  Family History  Problem Relation Age of Onset   High blood pressure Mother    Diabetes Mother    Arthritis Mother    Hyperlipidemia Mother    Heart disease Mother    High blood pressure Father    Hyperlipidemia Father    Dementia Father    High blood pressure Sister    Diabetes Brother    Hyperlipidemia Brother    High blood pressure Brother    Colon cancer Neg Hx    Stomach cancer Neg Hx    Esophageal cancer Neg Hx    Pancreatic cancer Neg Hx    Liver disease Neg Hx    Past Surgical History:  Procedure Laterality Date   APPLICATION OF WOUND VAC Right 04/12/2021   Procedure: APPLICATION OF WOUND VAC;  Surgeon: Terance Hart, MD;  Location: [facility] OR;  Service: Orthopedics;  Laterality: Right;   DIALYSIS/PERMA CATHETER INSERTION N/A 10/02/2023   Procedure: DIALYSIS/PERMA CATHETER INSERTION;  Surgeon: Daria Pastures, MD;  Location: [facility] INVASIVE CV LAB;  Service: Cardiovascular;  Laterality: N/A;   I & D EXTREMITY Right 04/06/2021   Procedure: IRRIGATION AND DEBRIDEMENT TOE WITH WOUND VAC PLACEMENT;  Surgeon: Terance Hart, MD;  Location: [facility] OR;  Service: Orthopedics;  Laterality: Right;   I & D EXTREMITY Right 04/12/2021   Procedure: IRRIGATION AND DEBRIDEMENT RIGHT FOOT;  Surgeon: Terance Hart, MD;  Location: [facility] OR;  Service: Orthopedics;  Laterality: Right;    IR FLUORO GUIDE CV LINE RIGHT  10/01/2023   IR FLUORO GUIDE CV LINE RIGHT  10/01/2023   IR FLUORO GUIDE CV LINE RIGHT  10/02/2023   IR US GUIDE VASC ACCESS RIGHT  10/01/2023   MOUTH SURGERY     ROBOTIC ASSITED PARTIAL NEPHRECTOMY Right 07/28/2019   Procedure: XI ROBOTIC ASSITED PARTIAL NEPHRECTOMY;  Surgeon: Malen Gauze, MD;  Location: [facility] ORS;  Service: Urology;  Laterality: Right;  3 HRS   SECONDARY CLOSURE OF WOUND Right 06/18/2021   Procedure: SECONDARY CLOSURE OF WOUND tRANSMETATARSAL AMPUTAION;  Surgeon: Terance Hart, MD;  Location: [facility] OR;  Service: Orthopedics;  Laterality: Right;   TRANSMETATARSAL AMPUTATION Right 04/12/2021   Procedure: TRANSMETATARSAL AMPUTATION;  Surgeon: Terance Hart, MD;  Location: [facility] OR;  Service: Orthopedics;  Laterality: Right;    Short Social History:  Social History   Tobacco Use   Smoking status: Never   Smokeless tobacco: Never  Substance Use Topics   Alcohol use: Yes    Alcohol/week: 1.0 standard drink of alcohol    Types: 1 Glasses of wine per week    Comment: occas    Allergies  Allergen Reactions   Amlodipine Swelling    Pelvis swells   Insulin Glargine Other (See Comments) and Hives   Humalog Kwikpen [Insulin Lispro] Other (See Comments)    75/50---swelling and ulcers  in mouth     Current Outpatient Medications  Medication Sig Dispense Refill   acetaminophen (TYLENOL) 650 MG CR tablet Take 650 mg by mouth once as needed for pain.     apixaban (ELIQUIS) 5 MG TABS tablet Take 1 tablet (5 mg total) by mouth 2 (two) times daily. 60 tablet 0   Blood Glucose Monitoring Suppl (BLOOD GLUCOSE MONITOR SYSTEM) w/Device KIT Use as directed in the morning, at noon, and at bedtime. 1 kit 0   carvedilol (COREG) 12.5 MG tablet Take 1 tablet (12.5 mg total) by mouth 2 (two) times daily with a meal. 60 tablet 0   Continuous Blood Gluc Sensor (DEXCOM G6 SENSOR) MISC by Does not apply route. (Patient not taking: Reported on 11/13/2022)      diphenhydrAMINE (BENADRYL) 25 mg capsule Take 1 capsule (25 mg total) by mouth 3 (three) times a week for 90 doses. Prior to dialysis 90 capsule 0   doxycycline (VIBRAMYCIN) 100 MG capsule Take 1 capsule (100 mg total) by mouth 2 (two) times daily for 7 days. 14 capsule 0   famotidine (PEPCID) 20 MG tablet Take 1 tablet (20 mg total) by mouth 3 (three) times a week. Prior to dialysis 1 tablet 0   furosemide (LASIX) 80 MG tablet Take 1 tablet (80 mg total) by mouth daily on your nondialysis days which will be Wednesday, Saturday and Sunday 30 tablet 0   gabapentin (NEURONTIN) 100 MG capsule Take 1 capsule (100 mg total) by mouth at bedtime. 90 capsule 0   hydrALAZINE (APRESOLINE) 100 MG tablet Take 1 tablet (100 mg total) by mouth every 8 (eight) hours. 90 tablet 0   insulin aspart (NOVOLOG) 100 UNIT/ML FlexPen Use as directed before each meal 3 times a day, 140-199 - 2 units, 200-250 - 4 units, 251-299 - 6 units,  300-349 - 8 units,  350 or above 10 units. 3 mL 0   Insulin Pen Needle 32G X 4 MM MISC Use as directed 4 times a day for subcutaneous insulin injection. 100 each 0   isosorbide mononitrate (IMDUR) 30 MG 24 hr tablet Take by mouth.     nitroGLYCERIN (NITROSTAT) 0.4 MG SL tablet Take by mouth.     omeprazole (PRILOSEC) 20 MG capsule TAKE 1 CAPSULE (20 MG TOTAL) BY MOUTH 2 (TWO) TIMES DAILY BEFORE A MEAL. 180 capsule 1   ondansetron (ZOFRAN-ODT) 4 MG disintegrating tablet Take 4 mg by mouth once as needed for vomiting or nausea.     rosuvastatin (CRESTOR) 10 MG tablet Take 1 tablet (10 mg total) by mouth daily. 30 tablet 11   sodium zirconium cyclosilicate (LOKELMA) 10 g PACK packet Take 10 g by mouth daily for 2 days. 2 packet 0   VITAMIN D, CHOLECALCIFEROL, PO Take 1 capsule by mouth daily. Patient takes 1 gummy daily. 10/12/2023.     No current facility-administered medications for this visit.    REVIEW OF SYSTEMS  Positive for ***  All other systems were reviewed and are negative      Objective:  Objective   There were no vitals filed for this visit. There is no height or weight on file to calculate BMI.  Physical Exam General: no acute distress Cardiac: hemodynamically stable Pulm: normal work of breathing Neuro: alert, no focal deficit Extremities: *** Vascular:   Right: palpable brachial, radial  Left: palpable brachial, radial   Data: UE arterial duplex ***    Vein mapping ***  Note from nephrologist reviewed  Assessment/Plan:     Tali Cleaves is a 43 y.o. female with ESRD who presents to discuss permanent access creation.  Dominant hand: {RIGHT/LEFT:20294} Previous access surgeries: none, TDC's Previous catheters: bilateral internal jugular and Right femoral Currently dialyzing via right femoral TDC on TTS Other arm surgeries/injuries: none  The vein mapping suggests there is *** a suitable *** and we discussed that they are a candidate for ***  The risks an benefits including of access creation were reviewed including: need for additional procedures, need for additional creations, steal, ischemia monomelic neuropathy, failure of access, and bleeding. The patient expressed understand and is willing to proceed.  I explained that I will perform intraoperative vein mapping to confirm the above findings and will determine the most appropriate access to create but with a preoperative plan for {RIGHT/LEFT:20294} arm ***.     Daria Pastures MD [facility]

## 2023-12-30 NOTE — H&P (View-Only) (Signed)
 Patient ID: Sherri Dixon, female   DOB: March 20, 1981, 43 y.o.   MRN: [id number]  Reason for Consult: No chief complaint on file.   Referred by Arita Miss, MD  Subjective:     HPI  Sherri Dixon is a 43 y.o. female who presents for evaluation HD access creation.  Past Medical History: No date: Anemia No date: Asthma No date: CHF (congestive heart failure) (HCC)     Comment:  Pt reports that father has CHF, not her No date: Diabetes mellitus without complication (HCC) No date: Dyslipidemia No date: GERD (gastroesophageal reflux disease) No date: Pulmonary embolism (HCC) No date: Stroke [facility])  Family History  Problem Relation Age of Onset   High blood pressure Mother    Diabetes Mother    Arthritis Mother    Hyperlipidemia Mother    Heart disease Mother    High blood pressure Father    Hyperlipidemia Father    Dementia Father    High blood pressure Sister    Diabetes Brother    Hyperlipidemia Brother    High blood pressure Brother    Colon cancer Neg Hx    Stomach cancer Neg Hx    Esophageal cancer Neg Hx    Pancreatic cancer Neg Hx    Liver disease Neg Hx    Past Surgical History:  Procedure Laterality Date   APPLICATION OF WOUND VAC Right 04/12/2021   Procedure: APPLICATION OF WOUND VAC;  Surgeon: Terance Hart, MD;  Location: [facility] OR;  Service: Orthopedics;  Laterality: Right;   DIALYSIS/PERMA CATHETER INSERTION N/A 10/02/2023   Procedure: DIALYSIS/PERMA CATHETER INSERTION;  Surgeon: Daria Pastures, MD;  Location: [facility] INVASIVE CV LAB;  Service: Cardiovascular;  Laterality: N/A;   I & D EXTREMITY Right 04/06/2021   Procedure: IRRIGATION AND DEBRIDEMENT TOE WITH WOUND VAC PLACEMENT;  Surgeon: Terance Hart, MD;  Location: [facility] OR;  Service: Orthopedics;  Laterality: Right;   I & D EXTREMITY Right 04/12/2021   Procedure: IRRIGATION AND DEBRIDEMENT RIGHT FOOT;  Surgeon: Terance Hart, MD;  Location: [facility] OR;  Service: Orthopedics;  Laterality: Right;    IR FLUORO GUIDE CV LINE RIGHT  10/01/2023   IR FLUORO GUIDE CV LINE RIGHT  10/01/2023   IR FLUORO GUIDE CV LINE RIGHT  10/02/2023   IR US GUIDE VASC ACCESS RIGHT  10/01/2023   MOUTH SURGERY     ROBOTIC ASSITED PARTIAL NEPHRECTOMY Right 07/28/2019   Procedure: XI ROBOTIC ASSITED PARTIAL NEPHRECTOMY;  Surgeon: Malen Gauze, MD;  Location: [facility] ORS;  Service: Urology;  Laterality: Right;  3 HRS   SECONDARY CLOSURE OF WOUND Right 06/18/2021   Procedure: SECONDARY CLOSURE OF WOUND tRANSMETATARSAL AMPUTAION;  Surgeon: Terance Hart, MD;  Location: [facility] OR;  Service: Orthopedics;  Laterality: Right;   TRANSMETATARSAL AMPUTATION Right 04/12/2021   Procedure: TRANSMETATARSAL AMPUTATION;  Surgeon: Terance Hart, MD;  Location: [facility] OR;  Service: Orthopedics;  Laterality: Right;    Short Social History:  Social History   Tobacco Use   Smoking status: Never   Smokeless tobacco: Never  Substance Use Topics   Alcohol use: Yes    Alcohol/week: 1.0 standard drink of alcohol    Types: 1 Glasses of wine per week    Comment: occas    Allergies  Allergen Reactions   Amlodipine Swelling    Pelvis swells   Insulin Glargine Other (See Comments) and Hives   Humalog Kwikpen [Insulin Lispro] Other (See Comments)    75/50---swelling and ulcers  in mouth     Current Outpatient Medications  Medication Sig Dispense Refill   acetaminophen (TYLENOL) 650 MG CR tablet Take 650 mg by mouth once as needed for pain.     apixaban (ELIQUIS) 5 MG TABS tablet Take 1 tablet (5 mg total) by mouth 2 (two) times daily. 60 tablet 0   Blood Glucose Monitoring Suppl (BLOOD GLUCOSE MONITOR SYSTEM) w/Device KIT Use as directed in the morning, at noon, and at bedtime. 1 kit 0   carvedilol (COREG) 12.5 MG tablet Take 1 tablet (12.5 mg total) by mouth 2 (two) times daily with a meal. 60 tablet 0   Continuous Blood Gluc Sensor (DEXCOM G6 SENSOR) MISC by Does not apply route. (Patient not taking: Reported on 11/13/2022)      diphenhydrAMINE (BENADRYL) 25 mg capsule Take 1 capsule (25 mg total) by mouth 3 (three) times a week for 90 doses. Prior to dialysis 90 capsule 0   doxycycline (VIBRAMYCIN) 100 MG capsule Take 1 capsule (100 mg total) by mouth 2 (two) times daily for 7 days. 14 capsule 0   famotidine (PEPCID) 20 MG tablet Take 1 tablet (20 mg total) by mouth 3 (three) times a week. Prior to dialysis 1 tablet 0   furosemide (LASIX) 80 MG tablet Take 1 tablet (80 mg total) by mouth daily on your nondialysis days which will be Wednesday, Saturday and Sunday 30 tablet 0   gabapentin (NEURONTIN) 100 MG capsule Take 1 capsule (100 mg total) by mouth at bedtime. 90 capsule 0   hydrALAZINE (APRESOLINE) 100 MG tablet Take 1 tablet (100 mg total) by mouth every 8 (eight) hours. 90 tablet 0   insulin aspart (NOVOLOG) 100 UNIT/ML FlexPen Use as directed before each meal 3 times a day, 140-199 - 2 units, 200-250 - 4 units, 251-299 - 6 units,  300-349 - 8 units,  350 or above 10 units. 3 mL 0   Insulin Pen Needle 32G X 4 MM MISC Use as directed 4 times a day for subcutaneous insulin injection. 100 each 0   isosorbide mononitrate (IMDUR) 30 MG 24 hr tablet Take by mouth.     nitroGLYCERIN (NITROSTAT) 0.4 MG SL tablet Take by mouth.     omeprazole (PRILOSEC) 20 MG capsule TAKE 1 CAPSULE (20 MG TOTAL) BY MOUTH 2 (TWO) TIMES DAILY BEFORE A MEAL. 180 capsule 1   ondansetron (ZOFRAN-ODT) 4 MG disintegrating tablet Take 4 mg by mouth once as needed for vomiting or nausea.     rosuvastatin (CRESTOR) 10 MG tablet Take 1 tablet (10 mg total) by mouth daily. 30 tablet 11   sodium zirconium cyclosilicate (LOKELMA) 10 g PACK packet Take 10 g by mouth daily for 2 days. 2 packet 0   VITAMIN D, CHOLECALCIFEROL, PO Take 1 capsule by mouth daily. Patient takes 1 gummy daily. 10/12/2023.     No current facility-administered medications for this visit.    REVIEW OF SYSTEMS  Positive for ***  All other systems were reviewed and are negative      Objective:  Objective   There were no vitals filed for this visit. There is no height or weight on file to calculate BMI.  Physical Exam General: no acute distress Cardiac: hemodynamically stable Pulm: normal work of breathing Neuro: alert, no focal deficit Extremities: *** Vascular:   Right: palpable brachial, radial  Left: palpable brachial, radial   Data: UE arterial duplex ***    Vein mapping ***  Note from nephrologist reviewed  Assessment/Plan:     Sherri Dixon is a 43 y.o. female with ESRD who presents to discuss permanent access creation.  Dominant hand: {RIGHT/LEFT:20294} Previous access surgeries: none, TDC's Previous catheters: bilateral internal jugular and Right femoral Currently dialyzing via right femoral TDC on TTS Other arm surgeries/injuries: none  The vein mapping suggests there is *** a suitable *** and we discussed that they are a candidate for ***  The risks an benefits including of access creation were reviewed including: need for additional procedures, need for additional creations, steal, ischemia monomelic neuropathy, failure of access, and bleeding. The patient expressed understand and is willing to proceed.  I explained that I will perform intraoperative vein mapping to confirm the above findings and will determine the most appropriate access to create but with a preoperative plan for {RIGHT/LEFT:20294} arm ***.     Daria Pastures MD [facility]

## 2024-01-01 ENCOUNTER — Other Ambulatory Visit: Payer: Self-pay

## 2024-01-01 ENCOUNTER — Ambulatory Visit (HOSPITAL_COMMUNITY)
Admission: RE | Admit: 2024-01-01 | Discharge: 2024-01-01 | Disposition: A | Discharge status: Home / Self Care | Payer: Medicare Other | Source: Ambulatory Visit | Attending: Vascular Surgery | Admitting: Vascular Surgery

## 2024-01-01 ENCOUNTER — Ambulatory Visit (INDEPENDENT_AMBULATORY_CARE_PROVIDER_SITE_OTHER): Payer: Medicare Other | Admitting: Vascular Surgery

## 2024-01-01 ENCOUNTER — Encounter: Payer: Self-pay | Admitting: Vascular Surgery

## 2024-01-01 VITALS — BP 121/88 | HR 95 | Temp 98.1°F | Ht 65.0 in | Wt 209.0 lb

## 2024-01-01 DIAGNOSIS — Z992 Dependence on renal dialysis: Secondary | ICD-10-CM | POA: Insufficient documentation

## 2024-01-01 DIAGNOSIS — N186 End stage renal disease: Secondary | ICD-10-CM

## 2024-01-05 ENCOUNTER — Other Ambulatory Visit: Payer: Self-pay

## 2024-01-05 ENCOUNTER — Ambulatory Visit (HOSPITAL_COMMUNITY)
Admission: RE | Admit: 2024-01-05 | Discharge: 2024-01-05 | Disposition: A | Discharge status: Home / Self Care | Attending: Nephrology | Admitting: Nephrology

## 2024-01-05 ENCOUNTER — Encounter (HOSPITAL_COMMUNITY)
Admission: RE | Disposition: A | Discharge status: Home / Self Care | Payer: Self-pay | Source: Home / Self Care | Attending: Nephrology

## 2024-01-05 DIAGNOSIS — T8242XA Displacement of vascular dialysis catheter, initial encounter: Secondary | ICD-10-CM | POA: Insufficient documentation

## 2024-01-05 DIAGNOSIS — Z7901 Long term (current) use of anticoagulants: Secondary | ICD-10-CM | POA: Insufficient documentation

## 2024-01-05 DIAGNOSIS — E1122 Type 2 diabetes mellitus with diabetic chronic kidney disease: Secondary | ICD-10-CM | POA: Insufficient documentation

## 2024-01-05 DIAGNOSIS — Z8673 Personal history of transient ischemic attack (TIA), and cerebral infarction without residual deficits: Secondary | ICD-10-CM | POA: Diagnosis not present

## 2024-01-05 DIAGNOSIS — Z992 Dependence on renal dialysis: Secondary | ICD-10-CM | POA: Diagnosis not present

## 2024-01-05 DIAGNOSIS — Z794 Long term (current) use of insulin: Secondary | ICD-10-CM | POA: Diagnosis not present

## 2024-01-05 DIAGNOSIS — D631 Anemia in chronic kidney disease: Secondary | ICD-10-CM | POA: Diagnosis not present

## 2024-01-05 DIAGNOSIS — N186 End stage renal disease: Secondary | ICD-10-CM | POA: Diagnosis not present

## 2024-01-05 DIAGNOSIS — Z86711 Personal history of pulmonary embolism: Secondary | ICD-10-CM | POA: Diagnosis not present

## 2024-01-05 DIAGNOSIS — Y839 Surgical procedure, unspecified as the cause of abnormal reaction of the patient, or of later complication, without mention of misadventure at the time of the procedure: Secondary | ICD-10-CM | POA: Insufficient documentation

## 2024-01-05 DIAGNOSIS — I132 Hypertensive heart and chronic kidney disease with heart failure and with stage 5 chronic kidney disease, or end stage renal disease: Secondary | ICD-10-CM | POA: Insufficient documentation

## 2024-01-05 HISTORY — PX: TUNNELLED CATHETER EXCHANGE: CATH118373

## 2024-01-05 LAB — GLUCOSE, CAPILLARY: Glucose-Capillary: 595 mg/dL (ref 70–99)

## 2024-01-05 SURGERY — TUNNELLED CATHETER EXCHANGE

## 2024-01-05 MED ORDER — HEPARIN SODIUM (PORCINE) 1000 UNIT/ML IJ SOLN
INTRAMUSCULAR | Status: DC | PRN
  Administered 2024-01-05 (×2): 3100 [IU] via INTRAVENOUS

## 2024-01-05 MED ORDER — LIDOCAINE HCL (PF) 1 % IJ SOLN
INTRAMUSCULAR | Status: DC | PRN
  Administered 2024-01-05: 5 mL

## 2024-01-05 MED ORDER — SODIUM CHLORIDE 0.9 % IV SOLN: INTRAVENOUS | Status: DC

## 2024-01-05 MED ORDER — MIDAZOLAM HCL 2 MG/2ML IJ SOLN
INTRAMUSCULAR | Status: DC | PRN
  Administered 2024-01-05: 2 mg via INTRAVENOUS

## 2024-01-05 MED ORDER — LIDOCAINE HCL (PF) 1 % IJ SOLN
INTRAMUSCULAR | Status: AC
  Filled 2024-01-05: qty 30

## 2024-01-05 MED ORDER — FENTANYL CITRATE (PF) 100 MCG/2ML IJ SOLN
INTRAMUSCULAR | Status: DC | PRN
  Administered 2024-01-05: 25 ug via INTRAVENOUS

## 2024-01-05 MED ORDER — MIDAZOLAM HCL 2 MG/2ML IJ SOLN
INTRAMUSCULAR | Status: AC
  Filled 2024-01-05: qty 2

## 2024-01-05 MED ORDER — FENTANYL CITRATE (PF) 100 MCG/2ML IJ SOLN
INTRAMUSCULAR | Status: AC
  Filled 2024-01-05: qty 2

## 2024-01-05 MED ORDER — HEPARIN (PORCINE) IN NACL 1000-0.9 UT/500ML-% IV SOLN
INTRAVENOUS | Status: DC | PRN
  Administered 2024-01-05: 500 mL

## 2024-01-05 SURGICAL SUPPLY — 5 items
BAG SNAP BAND KOVER 36X36 (MISCELLANEOUS) IMPLANT
COVER DOME SNAP 22 D (MISCELLANEOUS) IMPLANT
GLIDEWIRE STIFF .35X180X3 HYDR (WIRE) IMPLANT
KIT PALINDROME-P 55CM (CATHETERS) IMPLANT
TRAY PV CATH (CUSTOM PROCEDURE TRAY) IMPLANT

## 2024-01-05 NOTE — H&P (Signed)
 Sherri Dixon is an 43 y.o. female.   Chief Complaint: Displacement of right femoral dialysis catheter HPI:  43 year old woman with past medical history significant for type 2 diabetes mellitus, hypertension, history of CVA, history of PE on anticoagulation with Eliquis and end-stage renal disease on hemodialysis.  She was seen last week in the emergency room for accidental tunneled hemodialysis catheter displacement and had repositioning in the emergency room and was discharged home on oral antibiotics.  She has been referred today for a exchange of tunneled dialysis catheter.  She denies any constitutional complaints suggesting infection; no local erythema or discharge around the catheter exit site and does not have any fever or chills.  She does not have any chest pain or shortness of breath.  Reports that she is anxious about her procedure today.  Past Medical History:  Diagnosis Date   Anemia    Asthma    CHF (congestive heart failure) (HCC)    Pt reports that father has CHF, not her   Chronic kidney disease    Diabetes mellitus without complication (HCC)    Dyslipidemia    GERD (gastroesophageal reflux disease)    Pulmonary embolism (HCC)    Stroke [facility])     Past Surgical History:  Procedure Laterality Date   APPLICATION OF WOUND VAC Right 04/12/2021   Procedure: APPLICATION OF WOUND VAC;  Surgeon: Terance Hart, MD;  Location: [facility] OR;  Service: Orthopedics;  Laterality: Right;   DIALYSIS/PERMA CATHETER INSERTION N/A 10/02/2023   Procedure: DIALYSIS/PERMA CATHETER INSERTION;  Surgeon: Daria Pastures, MD;  Location: [facility] INVASIVE CV LAB;  Service: Cardiovascular;  Laterality: N/A;   I & D EXTREMITY Right 04/06/2021   Procedure: IRRIGATION AND DEBRIDEMENT TOE WITH WOUND VAC PLACEMENT;  Surgeon: Terance Hart, MD;  Location: [facility] OR;  Service: Orthopedics;  Laterality: Right;   I & D EXTREMITY Right 04/12/2021   Procedure: IRRIGATION AND DEBRIDEMENT RIGHT FOOT;  Surgeon: Terance Hart, MD;  Location: [facility] OR;  Service: Orthopedics;  Laterality: Right;   IR FLUORO GUIDE CV LINE RIGHT  10/01/2023   IR FLUORO GUIDE CV LINE RIGHT  10/01/2023   IR FLUORO GUIDE CV LINE RIGHT  10/02/2023   IR US GUIDE VASC ACCESS RIGHT  10/01/2023   MOUTH SURGERY     ROBOTIC ASSITED PARTIAL NEPHRECTOMY Right 07/28/2019   Procedure: XI ROBOTIC ASSITED PARTIAL NEPHRECTOMY;  Surgeon: Malen Gauze, MD;  Location: [facility] ORS;  Service: Urology;  Laterality: Right;  3 HRS   SECONDARY CLOSURE OF WOUND Right 06/18/2021   Procedure: SECONDARY CLOSURE OF WOUND tRANSMETATARSAL AMPUTAION;  Surgeon: Terance Hart, MD;  Location: [facility] OR;  Service: Orthopedics;  Laterality: Right;   TRANSMETATARSAL AMPUTATION Right 04/12/2021   Procedure: TRANSMETATARSAL AMPUTATION;  Surgeon: Terance Hart, MD;  Location: [facility] OR;  Service: Orthopedics;  Laterality: Right;    Family History  Problem Relation Age of Onset   High blood pressure Mother    Diabetes Mother    Arthritis Mother    Hyperlipidemia Mother    Heart disease Mother    High blood pressure Father    Hyperlipidemia Father    Dementia Father    High blood pressure Sister    Diabetes Brother    Hyperlipidemia Brother    High blood pressure Brother    Colon cancer Neg Hx    Stomach cancer Neg Hx    Esophageal cancer Neg Hx    Pancreatic cancer Neg Hx    Liver  disease Neg Hx    Social History:  reports that she has never smoked. She has never used smokeless tobacco. She reports current alcohol use of about 1.0 standard drink of alcohol per week. She reports that she does not use drugs.  Allergies:  Allergies  Allergen Reactions   Amlodipine Swelling    Pelvis swells   Insulin Glargine Other (See Comments) and Hives   Humalog Kwikpen [Insulin Lispro] Other (See Comments)    75/50---swelling and ulcers in mouth     Medications Prior to Admission  Medication Sig Dispense Refill   apixaban (ELIQUIS) 5 MG TABS tablet Take 1  tablet (5 mg total) by mouth 2 (two) times daily. 60 tablet 0   diphenhydrAMINE (BENADRYL) 25 mg capsule Take 1 capsule (25 mg total) by mouth 3 (three) times a week for 90 doses. Prior to dialysis (Patient taking differently: Take 50 mg by mouth daily as needed for allergies or itching. Prior to dialysis) 90 capsule 0   doxycycline (VIBRAMYCIN) 100 MG capsule Take 1 capsule (100 mg total) by mouth 2 (two) times daily for 7 days. 14 capsule 0   famotidine (PEPCID) 20 MG tablet Take 1 tablet (20 mg total) by mouth 3 (three) times a week. Prior to dialysis (Patient taking differently: Take 20 mg by mouth 4 (four) times a week. Mon, Wed, Fri, and Sun (non-dialysis days)) 1 tablet 0   gabapentin (NEURONTIN) 100 MG capsule Take 1 capsule (100 mg total) by mouth at bedtime. 90 capsule 0   hydrALAZINE (APRESOLINE) 100 MG tablet Take 1 tablet (100 mg total) by mouth every 8 (eight) hours. (Patient taking differently: Take 100 mg by mouth See admin instructions. Take 100 mg once daily on non-dialysis days (Mon, Wed, Fri, and Sun)) 90 tablet 0   insulin aspart (NOVOLOG) 100 UNIT/ML FlexPen Use as directed before each meal 3 times a day, 140-199 - 2 units, 200-250 - 4 units, 251-299 - 6 units,  300-349 - 8 units,  350 or above 10 units. (Patient taking differently: Inject 2-8 Units into the skin 3 (three) times daily with meals.) 3 mL 0   losartan (COZAAR) 25 MG tablet Take 25 mg by mouth at bedtime.     nitroGLYCERIN (NITROSTAT) 0.4 MG SL tablet Take by mouth.     omeprazole (PRILOSEC) 20 MG capsule TAKE 1 CAPSULE (20 MG TOTAL) BY MOUTH 2 (TWO) TIMES DAILY BEFORE A MEAL. 180 capsule 1   rosuvastatin (CRESTOR) 10 MG tablet Take 1 tablet (10 mg total) by mouth daily. 30 tablet 11   sevelamer (RENAGEL) 800 MG tablet Take 800 mg by mouth 3 (three) times daily with meals.     VITAMIN D, CHOLECALCIFEROL, PO Take 1 capsule by mouth Every Tuesday,Thursday,and Saturday with dialysis.     Blood Glucose Monitoring Suppl  (BLOOD GLUCOSE MONITOR SYSTEM) w/Device KIT Use as directed in the morning, at noon, and at bedtime. 1 kit 0   Continuous Blood Gluc Sensor (DEXCOM G6 SENSOR) MISC by Does not apply route.     Insulin Pen Needle 32G X 4 MM MISC Use as directed 4 times a day for subcutaneous insulin injection. 100 each 0    Results for orders placed or performed during the hospital encounter of 01/05/24 (from the past 48 hours)  Glucose, capillary     Status: Abnormal   Collection Time: 01/05/24  9:45 AM  Result Value Ref Range   Glucose-Capillary 595 (HH) 70 - 99 mg/dL    Comment: Glucose  reference range applies only to samples taken after fasting for at least 8 hours.   No results found.  Review of Systems  All other systems reviewed and are negative.   Blood pressure (!) 169/99, pulse 84, temperature 98.4 F (36.9 C), temperature source Oral, resp. rate 12, last menstrual period 12/10/2023, SpO2 100%. Physical Exam Vitals and nursing note reviewed.  Constitutional:      General: She is not in acute distress.    Appearance: Normal appearance. She is obese.  HENT:     Head: Normocephalic and atraumatic.     Nose: Nose normal.     Mouth/Throat:     Mouth: Mucous membranes are dry.     Pharynx: Oropharynx is clear.  Eyes:     Extraocular Movements: Extraocular movements intact.     Conjunctiva/sclera: Conjunctivae normal.  Cardiovascular:     Rate and Rhythm: Normal rate and regular rhythm.     Heart sounds: Normal heart sounds.  Musculoskeletal:     Cervical back: Normal range of motion.     Comments: Right femoral tunneled hemodialysis catheter with visible cuff at tunnel exit site.  1-2+ bilateral lower extremity edema status post right TMA  Neurological:     Mental Status: She is alert and oriented to person, place, and time.  Psychiatric:        Mood and Affect: Mood normal.      Assessment/Plan 1.  Displaced hemodialysis catheter: Will undertake over-the-wire exchange and replace  current dialysis catheter to appropriately position the cuff.  She is taking oral antibiotics and does not have systemic signs of infection. 2.  End-stage renal disease: Resume hemodialysis at the outpatient schedule following completion of procedure today. 3.  Hypertension: Significantly elevated blood pressure noted on account of anxiety, monitor with moderate sedation during procedure. 4.  Anemia: Denies overt blood loss other than from recent catheter displacement site last week.  Continue H/H monitoring and ESA per protocol at outpatient dialysis.  Dagoberto Ligas, MD 01/05/2024, 9:54 AM

## 2024-01-05 NOTE — Op Note (Signed)
 Patient presents with concerns over displaced right femoral dialysis catheter/extruded cuff that was placed 3 months ago. On examination, aspiration from both ports is good. Chest x-ray confirms the catheter tip is retracted into the IVC with an extruded cuff.  Summary:  1) The patient had a successful 55 cm CTT Palindrome hemodialysis catheter exchange in the right femoral vein. 2) Okay to use catheter immediately.  Description of procedure: The right thigh/groin and the catheter were prepped and draped in the usual sterile fashion. The exit site and adjacent tunnel tract were anesthetized with lidocaine 1% with epinephrine. The cuff was already extruded and dissection not indicated.   A hydrophilic wire was manipulated and advanced through the arterial port and the tip was parked in the SVC. The catheter was completely removed and noted to be entirely intact. A new 55 cm cuff to tip Palindrome catheter was inserted over the guidewire and the tip parked in the right atrium and at that point the cuff was approximately 1 cm deep to the exit site.   Aspiration and flushing of both limbs of the catheter confirmed excellent flow. No kinks were visible on fluoroscopic imaging. Both limbs of the catheter were locked with citrate and sterile caps were placed.   The hub was secured on to the chest wall with 2-0 nylon wing sutures.  Sterile dressings were placed, and the patient returned to recovery in stable condition.  Sedation: 2 mg Versed, 25 mcg Fentanyl Sedation time: 17 minutes  Contrast: 0 mL  Monitoring: Because of the patient's comorbid conditions and sedation during the procedure, continuous EKG monitoring and O2 saturation monitoring was performed throughout the procedure by the RN. There were no abnormal arrhythmias encountered.  Complications: None.  Diagnoses:   T82.42A Displacement of vascular dialysis catheter (extruded cuff) N18.6 End stage renal disease  Z99.2 Dialysis  dependence  Procedures Coding:  36581 Tunneled catheter exchange 77001  Fluoroscopy guidance for catheter exchange. G9562 Contrast  Recommendations: Remove the suture in 3 weeks. 2.   Report any blood flow problems to [facility].  Discharge: The patient was discharged home in stable condition. The patient was given education regarding the care of the catheter and specific instructions in case of any problems.

## 2024-01-06 ENCOUNTER — Encounter (HOSPITAL_COMMUNITY): Payer: Self-pay | Admitting: Nephrology

## 2024-01-18 ENCOUNTER — Encounter (HOSPITAL_COMMUNITY): Payer: Self-pay | Admitting: Vascular Surgery

## 2024-01-18 ENCOUNTER — Other Ambulatory Visit: Payer: Self-pay

## 2024-01-18 NOTE — Progress Notes (Signed)
 SDW CALL  Patient was given pre-op  instructions over the phone. The opportunity was given for the patient to ask questions. No further questions asked. Patient verbalized understanding of instructions given.   PCP - Zorita Hiss  NP Cardiologist -  DR. JIN ZHAO  Chest x-ray - 10-12-23 EKG - 12-29-23 Stress Test -  ECHO - 10-06-23 Cardiac Cath - denies  Sleep Study - negative study 04-15-23 CPAP -   Fasting Blood Sugar - 200's Checks Blood Sugar 3-4 times a day. Has not picked up and started using dexcom.  Blood Thinner Instructions: Eliquis  instructed LD 4/19. Pt states her LD was 4-17 Aspirin  Instructions:  ERAS Protcol - NPO PRE-SURGERY Ensure or G2-   COVID TEST- n/a  Pt reports she does have a chronic Left leg wound that is being treated at the wound clinic. Engineer, civil (consulting) is aware.   Anesthesia review: Yes, cardiac hx.  Patient denies shortness of breath, fever, cough and chest pain over the phone call

## 2024-01-19 NOTE — Anesthesia Preprocedure Evaluation (Addendum)
 Anesthesia Evaluation  Patient identified by MRN, date of birth, ID band Patient awake    Reviewed: Allergy & Precautions, H&P , NPO status , Patient's Chart, lab work & pertinent test results  Airway Mallampati: II  TM Distance: >3 FB Neck ROM: Full    Dental no notable dental hx.    Pulmonary asthma , PE   Pulmonary exam normal breath sounds clear to auscultation       Cardiovascular hypertension, +CHF  Normal cardiovascular exam Rhythm:Regular Rate:Normal  Pericardial effusion  Normal biventricular function  Moderate MS mean PG 5   Neuro/Psych CVA  negative psych ROS   GI/Hepatic Neg liver ROS,GERD  ,,  Endo/Other  diabetes, Type 1  Class 3 obesity  Renal/GU ESRFRenal diseaseRCC s/p nephrectomy  negative genitourinary   Musculoskeletal negative musculoskeletal ROS (+)    Abdominal   Peds negative pediatric ROS (+)  Hematology  (+) Blood dyscrasia, anemia   Anesthesia Other Findings   Reproductive/Obstetrics negative OB ROS                             Anesthesia Physical Anesthesia Plan  ASA: 3  Anesthesia Plan: Regional and MAC   Post-op Pain Management: Regional block*   Induction:   PONV Risk Score and Plan: 2 and Ondansetron  and Dexamethasone   Airway Management Planned: Natural Airway and Simple Face Mask  Additional Equipment: None  Intra-op Plan:   Post-operative Plan:   Informed Consent:      Dental advisory given  Plan Discussed with:   Anesthesia Plan Comments: (PAT note written 01/19/2024 by Allison Zelenak, PA-C.  Patient is a 43 year old female scheduled for the above procedure. Right femoral vein Palindrome HD catheter exchange on 01/05/24. She needs permanent HD access.   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    Last cardiology visit noted with Dr. Zhao was on 11/04/23.  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  Admission in January 2025 for progressive CKD with volume overload, anasarca, pericardial effusion.  Hemodialysis was initiated on 10/03/2023 via right femoral TDC.  Antihypertensives were held due to hypotension.  Now takes losartan 25 mg nightly and  hydralazine  on non-HD days.  She may be ultimately considered for transplant evaluation.  Echo ordered to reevaluate moderate to large pericardial effusion which have been noted on echo prior to starting dialysis, and six month office follow-up planned.    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 MVA = 2.750 cm2), small pericardial effusion.  She subsequently had a NM PYP Amyloid Spect w/CT on 12/30/23 that showed no evidence of cardiac amyloid.  Next cardiology follow-up is scheduled for 06/08/24.   )       Anesthesia Quick Evaluation

## 2024-01-19 NOTE — Progress Notes (Signed)
 Anesthesia Chart Review: SAME DAY WORK-UP  Case: [id number] Date/Time: 01/20/24 0715   Procedure: ARTERIOVENOUS (AV) FISTULA CREATION (Right)   Anesthesia type: General   Diagnosis: End stage renal disease (HCC) [N18.6]   Pre-op  diagnosis: END STAGE RENAL DISEASE   [facility] ROOM 11 / [facility]   Surgeons: [name], MD       DISCUSSION: Patient is a 43 year old female scheduled for the above procedure. Right femoral vein Palindrome HD catheter exchange on 01/05/24. She needs permanent HD access.  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   Last cardiology visit noted with Dr. Zhao was on 11/04/23.  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  Admission in January 2025 for progressive CKD with volume overload, anasarca, pericardial effusion.  Hemodialysis was initiated on 10/03/2023 via right femoral TDC.  Antihypertensives were held due to hypotension.  Now takes losartan 25 mg nightly and  hydralazine  on non-HD days.  She may be ultimately considered for transplant evaluation.  Echo ordered to reevaluate moderate to large pericardial effusion which have been noted on echo prior to starting dialysis, and six month office follow-up planned.   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 MVA = 2.750 cm2), small pericardial effusion.  She subsequently had a NM PYP Amyloid Spect w/CT on 12/30/23 that  showed no evidence of cardiac amyloid.  Next cardiology follow-up is scheduled for 06/08/24.  Per [facility], last Eliquis  planned for 01/16/24, however, she started holding on 01/14/24.  She is for labs on arrival.  Anesthesia team to evaluate on the day of surgery.    VS: LMP 12/10/2023  BP Readings from Last 3 Encounters:  01/05/24 (!) 189/96  01/01/24 121/88  12/29/23 (!) 199/98   Pulse Readings from Last 3 Encounters:  01/05/24 83  01/01/24 95  12/29/23 72     PROVIDERS: Tristan Furlough, NP is PCP Rogena Class, MD is cardiologist  Truman Gables, MD is nephrologist Roxine Cordia, NP is endocrinology provider [facility])   LABS: Most recent lab results in [facility] include: Lab Results  Component Value Date   WBC 9.6 12/29/2023   HGB 12.2 12/29/2023   HCT 36.0 12/29/2023   PLT 212 12/29/2023   GLUCOSE 435 (H) 12/29/2023   GLUCOSE 460 (H) 12/29/2023   ALT 17 10/12/2023   AST 19 10/12/2023   NA 128 (L) 12/29/2023   NA 130 (L) 12/29/2023   K 5.7 (H) 12/29/2023   K 5.8 (H) 12/29/2023   CL 98 12/29/2023   CL 97 (L) 12/29/2023   CREATININE 9.00 (H) 12/29/2023   CREATININE 7.79 (H) 12/29/2023   BUN 46 (H) 12/29/2023   BUN 49 (H) 12/29/2023   CO2 22 12/29/2023   INR 1.1 09/30/2023  A1c 5.9% on 10/21/2023 at [facility]   EKG:12/29/23: Sinus rhythm Borderline prolonged QT interval Confirmed by Afton Horse [phone]) on 12/31/2023 12:32:37 AM - QT/QTcB 451/494 ms   CV: NM PYP Amyloid Spect w/CT 12/30/23 ([facility]): IMPRESSION:  1. No  evidence of cardiac amyloid.    Echo (Complete) 12/16/23 ([facility]): IMPRESSION: Left Ventricle: Systolic function is normal. EF: 65-70%. Quantitative  analysis of left ventricular Global Longitudinal Strain (GLS) imaging is  -13.5%, which is abnormal. Strain imaging pattern suggests cardiac  amyloidosis.   Left Ventricle: There is at least moderate concentric hypertrophy  (walls were not visualized).    Mitral Valve: There is mild  stenosis, with a mean gradient of 3.000  mmHg. P1/2T = 80.000 ms. MVA = 2.750 cm2.    Mitral Valve: The leaflets are moderately thickened and exhibit mildly  reduced excursion.    Pericardium: There is a small pericardial effusion noted.   Normal LVEF with strain pattern suggestive of amyloidosis  At least moderate concentric left ventricular hypertrophy (walls were  difficult to visualize)  MAC with mild MS  Small pericardial effusion    Echo (Limited) 10/06/23: IMPRESSIONS   1. Left ventricular ejection fraction, by estimation, is 60 to 65%. The  left ventricle has normal function. The left ventricle has no regional  wall motion abnormalities. There is mild left ventricular hypertrophy.   2. Right ventricular systolic function is normal. The right ventricular  size is normal.   3. Left atrial size was mildly dilated.   4. Effusion is slightly large than TTE done 09/28/23 IVC is not dilated  and collpases with inspiration with no RA/RV diastolic collapse no  tamponade Now measures 2.4 cm lateral to LV posterior wall . moderate to  large. The pericardial effusion is  circumferential.   5. The mitral valve was not assessed. No evidence of mitral valve  regurgitation. No evidence of mitral stenosis.   6. The aortic valve was not assessed. Aortic valve regurgitation is not  visualized. No aortic stenosis is present.   7. The inferior vena cava is normal in size with greater than 50%  respiratory variability, suggesting right atrial pressure of 3 mmHg.    Nuclear stress test 04/26/19: IMPRESSION: 1. No reversible ischemia or infarction. 2. Normal left ventricular wall motion. 3. Left ventricular ejection fraction is 59%. 4. Non invasive risk stratification: Low   Past Medical History:  Diagnosis Date   Anemia    Asthma    as a child   CHF (congestive heart failure) (HCC)    Pt reports that father has CHF, not her   Chronic kidney disease    Diabetes mellitus without  complication (HCC)    Dyslipidemia    GERD (gastroesophageal reflux disease)    Hypertension    Pulmonary embolism (HCC)    Stroke [facility])     Past Surgical History:  Procedure Laterality Date   APPLICATION OF WOUND VAC Right 04/12/2021   Procedure: APPLICATION OF WOUND VAC;  Surgeon: Donnamarie Gables, MD;  Location: [facility] OR;  Service: Orthopedics;  Laterality: Right;   DIALYSIS/PERMA CATHETER INSERTION N/A 10/02/2023   Procedure: DIALYSIS/PERMA CATHETER INSERTION;  Surgeon: [name], MD;  Location: [facility] INVASIVE CV LAB;  Service: Cardiovascular;  Laterality: N/A;   I & D EXTREMITY Right 04/06/2021   Procedure: IRRIGATION AND DEBRIDEMENT TOE WITH WOUND VAC PLACEMENT;  Surgeon: Donnamarie Gables, MD;  Location: [facility] OR;  Service: Orthopedics;  Laterality: Right;   I & D EXTREMITY Right 04/12/2021   Procedure: IRRIGATION AND DEBRIDEMENT RIGHT FOOT;  Surgeon: Donnamarie Gables, MD;  Location: [facility] OR;  Service: Orthopedics;  Laterality: Right;   IR FLUORO GUIDE CV LINE RIGHT  10/01/2023   IR FLUORO GUIDE CV LINE RIGHT  10/01/2023   IR FLUORO GUIDE CV LINE RIGHT  10/02/2023   IR US  GUIDE VASC ACCESS RIGHT  10/01/2023   MOUTH SURGERY     ROBOTIC ASSITED PARTIAL NEPHRECTOMY Right 07/28/2019   Procedure: XI ROBOTIC ASSITED PARTIAL NEPHRECTOMY;  Surgeon: Marco Severs, MD;  Location: [facility] ORS;  Service: Urology;  Laterality: Right;  3 HRS   SECONDARY CLOSURE OF WOUND Right 06/18/2021   Procedure: SECONDARY CLOSURE OF WOUND tRANSMETATARSAL AMPUTAION;  Surgeon: Donnamarie Gables, MD;  Location: [facility] OR;  Service: Orthopedics;  Laterality: Right;   TRANSMETATARSAL AMPUTATION Right 04/12/2021   Procedure: TRANSMETATARSAL AMPUTATION;  Surgeon: Donnamarie Gables, MD;  Location: [facility] OR;  Service: Orthopedics;  Laterality: Right;   TUNNELLED CATHETER EXCHANGE N/A 01/05/2024   Procedure: TUNNELLED CATHETER EXCHANGE;  Surgeon: Melodie Spry, MD;  Location: [facility] INVASIVE CV LAB;  Service: Cardiovascular;   Laterality: N/A;    MEDICATIONS: No current facility-administered medications for this encounter.    apixaban  (ELIQUIS ) 5 MG TABS tablet   Blood Glucose Monitoring Suppl (BLOOD GLUCOSE MONITOR SYSTEM) w/Device KIT   Continuous Blood Gluc Sensor (DEXCOM G6 SENSOR) MISC   diphenhydrAMINE  (BENADRYL ) 25 mg capsule   famotidine  (PEPCID ) 20 MG tablet   gabapentin  (NEURONTIN ) 100 MG capsule   hydrALAZINE  (APRESOLINE ) 100 MG tablet   insulin  aspart (NOVOLOG ) 100 UNIT/ML FlexPen   Insulin  Pen Needle 32G X 4 MM MISC   losartan (COZAAR) 25 MG tablet   nitroGLYCERIN  (NITROSTAT ) 0.4 MG SL tablet   omeprazole  (PRILOSEC) 20 MG capsule   rosuvastatin  (CRESTOR ) 10 MG tablet   sevelamer (RENAGEL) 800 MG tablet   VITAMIN D , CHOLECALCIFEROL, PO     Aamna Mallozzi, PA-C Surgical Short Stay/Anesthesiology [facility] Phone [phone] [facility] Phone [phone] 01/19/2024 2:37 PM

## 2024-01-20 ENCOUNTER — Other Ambulatory Visit: Payer: Self-pay

## 2024-01-20 ENCOUNTER — Encounter (HOSPITAL_COMMUNITY)
Admission: RE | Disposition: A | Discharge status: Home / Self Care | Payer: Self-pay | Source: Home / Self Care | Attending: Vascular Surgery

## 2024-01-20 ENCOUNTER — Ambulatory Visit (HOSPITAL_COMMUNITY): Admitting: Vascular Surgery

## 2024-01-20 ENCOUNTER — Ambulatory Visit (HOSPITAL_COMMUNITY)
Admission: RE | Admit: 2024-01-20 | Discharge: 2024-01-20 | Disposition: A | Discharge status: Home / Self Care | Attending: Vascular Surgery | Admitting: Vascular Surgery

## 2024-01-20 ENCOUNTER — Other Ambulatory Visit (HOSPITAL_COMMUNITY): Payer: Self-pay

## 2024-01-20 DIAGNOSIS — I509 Heart failure, unspecified: Secondary | ICD-10-CM

## 2024-01-20 DIAGNOSIS — Z8673 Personal history of transient ischemic attack (TIA), and cerebral infarction without residual deficits: Secondary | ICD-10-CM | POA: Diagnosis not present

## 2024-01-20 DIAGNOSIS — Z86711 Personal history of pulmonary embolism: Secondary | ICD-10-CM | POA: Insufficient documentation

## 2024-01-20 DIAGNOSIS — Z992 Dependence on renal dialysis: Secondary | ICD-10-CM | POA: Diagnosis not present

## 2024-01-20 DIAGNOSIS — E1022 Type 1 diabetes mellitus with diabetic chronic kidney disease: Secondary | ICD-10-CM

## 2024-01-20 DIAGNOSIS — Z794 Long term (current) use of insulin: Secondary | ICD-10-CM | POA: Diagnosis not present

## 2024-01-20 DIAGNOSIS — Z905 Acquired absence of kidney: Secondary | ICD-10-CM | POA: Diagnosis not present

## 2024-01-20 DIAGNOSIS — Z86718 Personal history of other venous thrombosis and embolism: Secondary | ICD-10-CM | POA: Diagnosis not present

## 2024-01-20 DIAGNOSIS — I120 Hypertensive chronic kidney disease with stage 5 chronic kidney disease or end stage renal disease: Secondary | ICD-10-CM | POA: Insufficient documentation

## 2024-01-20 DIAGNOSIS — E785 Hyperlipidemia, unspecified: Secondary | ICD-10-CM | POA: Insufficient documentation

## 2024-01-20 DIAGNOSIS — I132 Hypertensive heart and chronic kidney disease with heart failure and with stage 5 chronic kidney disease, or end stage renal disease: Secondary | ICD-10-CM

## 2024-01-20 DIAGNOSIS — Z85528 Personal history of other malignant neoplasm of kidney: Secondary | ICD-10-CM | POA: Diagnosis not present

## 2024-01-20 DIAGNOSIS — N185 Chronic kidney disease, stage 5: Secondary | ICD-10-CM | POA: Diagnosis not present

## 2024-01-20 DIAGNOSIS — N186 End stage renal disease: Secondary | ICD-10-CM | POA: Diagnosis not present

## 2024-01-20 DIAGNOSIS — Z7901 Long term (current) use of anticoagulants: Secondary | ICD-10-CM | POA: Insufficient documentation

## 2024-01-20 DIAGNOSIS — K219 Gastro-esophageal reflux disease without esophagitis: Secondary | ICD-10-CM | POA: Insufficient documentation

## 2024-01-20 HISTORY — PX: AV FISTULA PLACEMENT: SHX1204

## 2024-01-20 HISTORY — DX: Essential (primary) hypertension: I10

## 2024-01-20 LAB — HCG, SERUM, QUALITATIVE: Preg, Serum: NEGATIVE

## 2024-01-20 LAB — POCT I-STAT, CHEM 8
BUN: 29 mg/dL — ABNORMAL HIGH (ref 6–20)
Calcium, Ion: 1.07 mmol/L — ABNORMAL LOW (ref 1.15–1.40)
Chloride: 97 mmol/L — ABNORMAL LOW (ref 98–111)
Creatinine, Ser: 6 mg/dL — ABNORMAL HIGH (ref 0.44–1.00)
Glucose, Bld: 214 mg/dL — ABNORMAL HIGH (ref 70–99)
HCT: 29 % — ABNORMAL LOW (ref 36.0–46.0)
Hemoglobin: 9.9 g/dL — ABNORMAL LOW (ref 12.0–15.0)
Potassium: 3.7 mmol/L (ref 3.5–5.1)
Sodium: 133 mmol/L — ABNORMAL LOW (ref 135–145)
TCO2: 23 mmol/L (ref 22–32)

## 2024-01-20 LAB — GLUCOSE, CAPILLARY
Glucose-Capillary: 205 mg/dL — ABNORMAL HIGH (ref 70–99)
Glucose-Capillary: 296 mg/dL — ABNORMAL HIGH (ref 70–99)

## 2024-01-20 SURGERY — ARTERIOVENOUS (AV) FISTULA CREATION
Anesthesia: Monitor Anesthesia Care | Site: Arm Lower | Laterality: Right

## 2024-01-20 MED ORDER — OXYCODONE HCL 5 MG/5ML PO SOLN
ORAL | Status: AC
  Filled 2024-01-20: qty 5

## 2024-01-20 MED ORDER — PROPOFOL 10 MG/ML IV BOLUS
INTRAVENOUS | Status: AC
  Filled 2024-01-20: qty 20

## 2024-01-20 MED ORDER — CHLORHEXIDINE GLUCONATE 0.12 % MT SOLN: 15.0000 mL | Freq: Once | OROMUCOSAL | Status: AC

## 2024-01-20 MED ORDER — OXYCODONE HCL 5 MG PO TABS: 5.0000 mg | ORAL_TABLET | Freq: Once | ORAL | Status: AC | PRN

## 2024-01-20 MED ORDER — CHLORHEXIDINE GLUCONATE 0.12 % MT SOLN
OROMUCOSAL | Status: AC
  Administered 2024-01-20: 15 mL via OROMUCOSAL
  Filled 2024-01-20: qty 15

## 2024-01-20 MED ORDER — DROPERIDOL 2.5 MG/ML IJ SOLN: 0.6250 mg | Freq: Once | INTRAMUSCULAR | Status: DC | PRN

## 2024-01-20 MED ORDER — INSULIN ASPART 100 UNIT/ML IJ SOLN
5.0000 [IU] | Freq: Once | INTRAMUSCULAR | Status: AC
  Administered 2024-01-20: 5 [IU] via SUBCUTANEOUS

## 2024-01-20 MED ORDER — PHENYLEPHRINE 80 MCG/ML (10ML) SYRINGE FOR IV PUSH (FOR BLOOD PRESSURE SUPPORT)
PREFILLED_SYRINGE | INTRAVENOUS | Status: DC | PRN
  Administered 2024-01-20 (×4): 80 ug via INTRAVENOUS

## 2024-01-20 MED ORDER — OXYCODONE HCL 5 MG/5ML PO SOLN
5.0000 mg | Freq: Once | ORAL | Status: AC | PRN
  Administered 2024-01-20: 5 mg via ORAL

## 2024-01-20 MED ORDER — FENTANYL CITRATE (PF) 100 MCG/2ML IJ SOLN: 25.0000 ug | INTRAMUSCULAR | Status: DC | PRN

## 2024-01-20 MED ORDER — HEPARIN 6000 UNIT IRRIGATION SOLUTION
Status: DC | PRN
  Administered 2024-01-20: 1

## 2024-01-20 MED ORDER — MIDAZOLAM HCL 2 MG/2ML IJ SOLN
INTRAMUSCULAR | Status: AC
  Filled 2024-01-20: qty 2

## 2024-01-20 MED ORDER — ORAL CARE MOUTH RINSE: 15.0000 mL | Freq: Once | OROMUCOSAL | Status: AC

## 2024-01-20 MED ORDER — PROPOFOL 500 MG/50ML IV EMUL
INTRAVENOUS | Status: DC | PRN
  Administered 2024-01-20: 50 ug/kg/min via INTRAVENOUS

## 2024-01-20 MED ORDER — HEPARIN 6000 UNIT IRRIGATION SOLUTION
Status: AC
  Filled 2024-01-20: qty 500

## 2024-01-20 MED ORDER — LIDOCAINE-EPINEPHRINE (PF) 1.5 %-1:200000 IJ SOLN
INTRAMUSCULAR | Status: DC | PRN
  Administered 2024-01-20: 25 mL via PERINEURAL

## 2024-01-20 MED ORDER — ACETAMINOPHEN 10 MG/ML IV SOLN: 1000.0000 mg | Freq: Once | INTRAVENOUS | Status: DC | PRN

## 2024-01-20 MED ORDER — OXYCODONE-ACETAMINOPHEN 5-325 MG PO TABS
1.0000 | ORAL_TABLET | Freq: Four times a day (QID) | ORAL | 0 refills | Status: DC | PRN
  Filled 2024-01-20: qty 10, 3d supply, fill #0

## 2024-01-20 MED ORDER — CHLORHEXIDINE GLUCONATE 4 % EX SOLN: 60.0000 mL | Freq: Once | CUTANEOUS | Status: DC

## 2024-01-20 MED ORDER — SODIUM CHLORIDE 0.9 % IV SOLN: INTRAVENOUS | Status: DC

## 2024-01-20 MED ORDER — 0.9 % SODIUM CHLORIDE (POUR BTL) OPTIME
TOPICAL | Status: DC | PRN
  Administered 2024-01-20: 1000 mL

## 2024-01-20 MED ORDER — CEFAZOLIN SODIUM-DEXTROSE 2-4 GM/100ML-% IV SOLN
2.0000 g | INTRAVENOUS | Status: AC
  Administered 2024-01-20: 2 g via INTRAVENOUS
  Filled 2024-01-20: qty 100

## 2024-01-20 MED ORDER — PAPAVERINE HCL 30 MG/ML IJ SOLN
INTRAMUSCULAR | Status: AC
  Filled 2024-01-20: qty 2

## 2024-01-20 MED ORDER — MIDAZOLAM HCL 2 MG/2ML IJ SOLN
INTRAMUSCULAR | Status: DC | PRN
  Administered 2024-01-20: 2 mg via INTRAVENOUS

## 2024-01-20 MED ORDER — HEPARIN SODIUM (PORCINE) 1000 UNIT/ML IJ SOLN
INTRAMUSCULAR | Status: DC | PRN
  Administered 2024-01-20: 3000 [IU] via INTRAVENOUS

## 2024-01-20 MED ORDER — LIDOCAINE-EPINEPHRINE (PF) 1 %-1:200000 IJ SOLN
INTRAMUSCULAR | Status: AC
  Filled 2024-01-20: qty 30

## 2024-01-20 MED ORDER — SODIUM CHLORIDE 0.9% FLUSH: 3.0000 mL | INTRAVENOUS | Status: DC | PRN

## 2024-01-20 SURGICAL SUPPLY — 29 items
ARMBAND PINK RESTRICT EXTREMIT (MISCELLANEOUS) ×1 IMPLANT
BAG COUNTER SPONGE SURGICOUNT (BAG) ×1 IMPLANT
CANISTER SUCT 3000ML PPV (MISCELLANEOUS) ×1 IMPLANT
CLIP TI MEDIUM 6 (CLIP) ×1 IMPLANT
CLIP TI WIDE RED SMALL 6 (CLIP) ×1 IMPLANT
COVER PROBE W GEL 5X96 (DRAPES) IMPLANT
COVER ULTRASOUND PROBE 36 ST (MISCELLANEOUS) IMPLANT
DERMABOND ADVANCED .7 DNX12 (GAUZE/BANDAGES/DRESSINGS) ×1 IMPLANT
ELECTRODE REM PT RTRN 9FT ADLT (ELECTROSURGICAL) ×1 IMPLANT
GLOVE BIOGEL PI IND STRL 7.0 (GLOVE) ×1 IMPLANT
GOWN STRL REUS W/ TWL LRG LVL3 (GOWN DISPOSABLE) ×2 IMPLANT
GOWN STRL REUS W/ TWL XL LVL3 (GOWN DISPOSABLE) ×1 IMPLANT
HEMOSTAT SNOW SURGICEL 2X4 (HEMOSTASIS) IMPLANT
INSERT FOGARTY SM (MISCELLANEOUS) IMPLANT
KIT BASIN OR (CUSTOM PROCEDURE TRAY) ×1 IMPLANT
KIT TURNOVER KIT B (KITS) ×1 IMPLANT
LOOP VESSEL MINI RED (MISCELLANEOUS) IMPLANT
NS IRRIG 1000ML POUR BTL (IV SOLUTION) ×1 IMPLANT
PACK CV ACCESS (CUSTOM PROCEDURE TRAY) ×1 IMPLANT
PAD ARMBOARD POSITIONER FOAM (MISCELLANEOUS) ×2 IMPLANT
POWDER SURGICEL 3.0 GRAM (HEMOSTASIS) IMPLANT
SLING ARM FOAM STRAP LRG (SOFTGOODS) IMPLANT
SUT MNCRL AB 4-0 PS2 18 (SUTURE) ×1 IMPLANT
SUT PROLENE 6 0 BV (SUTURE) ×1 IMPLANT
SUT VIC AB 3-0 SH 27X BRD (SUTURE) ×1 IMPLANT
TOWEL GREEN STERILE (TOWEL DISPOSABLE) ×1 IMPLANT
UNDERPAD 30X36 HEAVY ABSORB (UNDERPADS AND DIAPERS) ×1 IMPLANT
VASCULAR TIE MINI RED 18IN STL (MISCELLANEOUS) IMPLANT
WATER STERILE IRR 1000ML POUR (IV SOLUTION) ×1 IMPLANT

## 2024-01-20 NOTE — Discharge Instructions (Signed)
   [facility]  Discharge Instructions  AV Fistula or Graft Surgery for Dialysis Access  Please refer to the following instructions for your post-procedure care. Your surgeon or physician assistant will discuss any changes with you.  Activity  You may drive the day following your surgery, if you are comfortable and no longer taking prescription pain medication. Resume full activity as the soreness in your incision resolves.  Bathing/Showering  You may shower after you go home. Keep your incision dry for 48 hours. Do not soak in a bathtub, hot tub, or swim until the incision heals completely. You may not shower if you have a hemodialysis catheter.  Incision Care  Clean your incision with mild soap and water  after 48 hours. Pat the area dry with a clean towel. You do not need a bandage unless otherwise instructed. Do not apply any ointments or creams to your incision. You may have skin glue on your incision. Do not peel it off. It will come off on its own in about one week. Your arm may swell a bit after surgery. To reduce swelling use pillows to elevate your arm so it is above your heart. Your doctor will tell you if you need to lightly wrap your arm with an ACE bandage.  Diet  Resume your normal diet. There are not special food restrictions following this procedure. In order to heal from your surgery, it is CRITICAL to get adequate nutrition. Your body requires vitamins, minerals, and protein. Vegetables are the best source of vitamins and minerals. Vegetables also provide the perfect balance of protein. Processed food has little nutritional value, so try to avoid this.  Medications  Resume taking all of your medications. If your incision is causing pain, you may take over-the counter pain relievers such as acetaminophen  (Tylenol ). If you were prescribed a stronger pain medication, please be aware these medications can cause nausea and constipation. Prevent  nausea by taking the medication with a snack or meal. Avoid constipation by drinking plenty of fluids and eating foods with high amount of fiber, such as fruits, vegetables, and grains.  Do not take Tylenol  if you are taking prescription pain medications.  Follow up Your surgeon may want to see you in the office following your access surgery. If so, this will be arranged at the time of your surgery.  Please call us  immediately for any of the following conditions:  Increased pain, redness, drainage (pus) from your incision site Fever of 101 degrees or higher Severe or worsening pain at your incision site Hand pain or numbness.  Reduce your risk of vascular disease:  Stop smoking. If you would like help, call [employer] at 1-800-QUIT-NOW ((609) 828-3849) or [employer] at (706) 302-3416  Manage your cholesterol Maintain a desired weight Control your diabetes Keep your blood pressure down  Dialysis  It will take several weeks to several months for your new dialysis access to be ready for use. Your surgeon will determine when it is okay to use it. Your nephrologist will continue to direct your dialysis. You can continue to use your Permcath until your new access is ready for use.   01/20/2024 Sherri Dixon [id number] 07/08/1981  Surgeon(s): [name], MD  Procedure(s): ARTERIOVENOUS (AV) FISTULA CREATION   May stick graft immediately   May stick graft on designated area only:    Do not stick right AV fistula for 12 weeks    If you have any questions, please call the office at [phone].

## 2024-01-20 NOTE — Op Note (Signed)
    OPERATIVE NOTE  PROCEDURE:   Intraoperative right arm vein mapping right arm brachiocephalic AVF creation  PRE-OPERATIVE DIAGNOSIS: ESRD  POST-OPERATIVE DIAGNOSIS: same as above   SURGEON: [name] MD  ASSISTANT(S): none  ANESTHESIA: regional  ESTIMATED BLOOD LOSS: 20 cc  FINDING(S): Sufficiently sized right arm cephalic vein, area of sclerosis in the AC fossa Moderate amount of inflammation in the AV fossa Sufficiently sized right brachial artery Palpable and doppler thrill in AVF with multiphasic radial artery on completion  SPECIMEN(S):  none  INDICATIONS:   Sherri Dixon is a 43 y.o. female with ESRD. The patient is currently on dialysis via femoral TDC. They were seen in the office for evaluation of hemodialysis access. The risks an benefits including of access creation were reviewed including: need for additional procedures, need for additional creations, steal, ischemia monomelic neuropathy, failure of access, and bleeding. The patient expressed understand and is willing to proceed.    DESCRIPTION: The patient was brought to the operating room positioned supine on the operating table.  The right arm was prepped and draped in usual sterile fashion.  Timeout was performed and preoperative antibiotics were administered.  The case began with ultrasound mapping of the brachial artery and cephalic vein, which demonstrated sufficient size at the antecubital fossa for arteriovenous fistula.  A transverse incision was made 1 finger breadth below the elbow creese in the antecubital fossa. The  cephalic vein was isolated for 3 cm in length. It should be noted that there was some inflammation and scarring around the cephalic vein at the [facility] fossa. Next the aponeurosis was partially released and the brachial artery secured with a vessel loop. The patient was heparinized. The cephalic vein was transected and ligated distally with a 2-0 silk and vascular clip. The vein was  dilated and flushed with heparin  saline. Vascular clamps were placed proximally and distally on the brachial artery and an approximate 6 mm arteriotomy was created on the brachial artery. This was flushed with heparin  saline.  An anastomosis was created in end to side fashion on the brachial artery using running 6-0 Prolene suture. Prior to completing the anastomosis, the vessels were flushed and the suture line was tied down. There was an excellent thrill in the cephalic vein from the anastomosis to the mid upper arm. The patient had a multiphasic radial signal and had an excellent doppler signal in the fistula. The incision was irrigated and hemostasis achieved with cautery and suture. The deeper tissue was closed with 3-0 Vicryl and the skin closed with 4-0 Monocryl. Dermabond was applied to the incisions. The patient was transferred to PACU in stable condition.  COMPLICATIONS: none apparent  CONDITION: stable  [name] MD [facility] Phone Number: [phone] 01/20/2024 9:23 AM

## 2024-01-20 NOTE — Transfer of Care (Signed)
 Immediate Anesthesia Transfer of Care Note  Patient: Sherri Dixon  Procedure(s) Performed: ARTERIOVENOUS (AV) FISTULA CREATION (Right)  Patient Location: PACU  Anesthesia Type:MAC  Level of Consciousness: awake, alert , and oriented  Airway & Oxygen Therapy: Patient Spontanous Breathing  Post-op Assessment: Report given to RN and Post -op Vital signs reviewed and stable  Post vital signs: Reviewed and stable  Last Vitals:  Vitals Value Taken Time  BP 120/61 01/20/24 0931  Temp    Pulse 93 01/20/24 0933  Resp 17 01/20/24 0933  SpO2 100 % 01/20/24 0933  Vitals shown include unfiled device data.  Last Pain:  Vitals:   01/20/24 0611  TempSrc:   PainSc: 0-No pain         Complications: No notable events documented.

## 2024-01-20 NOTE — Interval H&P Note (Signed)
 History and Physical Interval Note:  01/20/2024 7:06 AM  Sherri Dixon  has presented today for surgery, with the diagnosis of END STAGE RENAL DISEASE.  The various methods of treatment have been discussed with the patient and family. After consideration of risks, benefits and other options for treatment, the patient has consented to  Procedure(s): ARTERIOVENOUS (AV) FISTULA CREATION (Right) as a surgical intervention.  The patient's history has been reviewed, patient examined, no change in status, stable for surgery.  I have reviewed the patient's chart and labs.  Questions were answered to the patient's satisfaction.     [name]

## 2024-01-20 NOTE — Anesthesia Postprocedure Evaluation (Signed)
 Anesthesia Post Note  Patient: Sherri Dixon  Procedure(s) Performed: RIGHT ARM ARTERIOVENOUS (AV) BRACHIOCEPHALIC FISTULA CREATION (Right: Arm Lower)     Patient location during evaluation: PACU Anesthesia Type: Regional and MAC Level of consciousness: awake and alert Pain management: pain level controlled Vital Signs Assessment: post-procedure vital signs reviewed and stable Respiratory status: spontaneous breathing, nonlabored ventilation, respiratory function stable and patient connected to nasal cannula oxygen Cardiovascular status: stable and blood pressure returned to baseline Postop Assessment: no apparent nausea or vomiting Anesthetic complications: no   No notable events documented.  Last Vitals:  Vitals:   01/20/24 0945 01/20/24 1000  BP: 121/63 139/81  Pulse: 91 90  Resp: 18 14  Temp:  36.7 C  SpO2: 100% 100%    Last Pain:  Vitals:   01/20/24 1000  TempSrc:   PainSc: 0-No pain                 Lethaniel Rave

## 2024-01-20 NOTE — Anesthesia Procedure Notes (Signed)
 Anesthesia Regional Block: Supraclavicular block   Pre-Anesthetic Checklist: , timeout performed,  Correct Patient, Correct Site, Correct Laterality,  Correct Procedure, Correct Position, site marked,  Risks and benefits discussed,  Surgical consent,  Pre-op  evaluation,  At surgeon's request and post-op pain management  Laterality: Right  Prep: chloraprep       Needles:  Injection technique: Single-shot  Needle Type: Echogenic Stimulator Needle     Needle Length: 9cm  Needle Gauge: 21     Additional Needles:   Procedures:,,,, ultrasound used (permanent image in chart),,    Narrative:  Start time: 01/20/2024 7:15 AM End time: 01/20/2024 7:25 AM Injection made incrementally with aspirations every 5 mL.  Performed by: Personally  Anesthesiologist: Lethaniel Rave, MD  Additional Notes: Discussed risks and benefits of the nerve block in detail, including but not limited vascular injury, permanent nerve damage and infection.   Patient tolerated the procedure well. Local anesthetic introduced in an incremental fashion under minimal resistance after negative aspirations. No paresthesias were elicited. After completion of the procedure, no acute issues were identified and patient continued to be monitored by RN.

## 2024-01-21 ENCOUNTER — Encounter (HOSPITAL_COMMUNITY): Payer: Self-pay | Admitting: Vascular Surgery

## 2024-02-27 ENCOUNTER — Encounter (HOSPITAL_COMMUNITY): Payer: Self-pay

## 2024-02-27 ENCOUNTER — Other Ambulatory Visit: Payer: Self-pay

## 2024-02-27 ENCOUNTER — Emergency Department (HOSPITAL_COMMUNITY)
Admission: EM | Admit: 2024-02-27 | Discharge: 2024-02-27 | Disposition: A | Discharge status: Home / Self Care | Attending: Emergency Medicine | Admitting: Emergency Medicine

## 2024-02-27 DIAGNOSIS — Z7901 Long term (current) use of anticoagulants: Secondary | ICD-10-CM | POA: Diagnosis not present

## 2024-02-27 DIAGNOSIS — Y841 Kidney dialysis as the cause of abnormal reaction of the patient, or of later complication, without mention of misadventure at the time of the procedure: Secondary | ICD-10-CM | POA: Diagnosis not present

## 2024-02-27 DIAGNOSIS — T8241XA Breakdown (mechanical) of vascular dialysis catheter, initial encounter: Secondary | ICD-10-CM | POA: Diagnosis not present

## 2024-02-27 DIAGNOSIS — Z794 Long term (current) use of insulin: Secondary | ICD-10-CM | POA: Diagnosis not present

## 2024-02-27 DIAGNOSIS — N186 End stage renal disease: Secondary | ICD-10-CM | POA: Diagnosis present

## 2024-02-27 NOTE — H&P (View-Only) (Signed)
 I was called that the patient pulled out her right femoral dialysis catheter placed by Dr. Susi Eric.  No urgent need for dialysis.  Will place on the PV schedule for Monday for placement of new femoral tunneled dialysis catheter.  Discussed holding her Eliquis  with the ED.  N.p.o. after midnight on Sunday.  Her fistula is only about 45 weeks old and not ready for use.  Young Hensen, MD [facility] [place] Office: [phone]   Young Hensen

## 2024-02-27 NOTE — ED Triage Notes (Signed)
 Pt had right femoral dialysis catheter and pt states she woke up with her dialysis catheter partially out, laying on her knee. When she stood up it came out all the way. Pt brought catheter with her. States it only bled a little. Denies pain or swelling at insertion site. Pt just c.o the area itching. Pt denies sob. Has a fistula in right upper arm and is almost ready to use.

## 2024-02-27 NOTE — ED Provider Notes (Signed)
 [place] [facility]PARTMENT AT [facility] Provider Note   CSN: [id number] Arrival date & time: 02/27/24  4098     History  Chief Complaint  Patient presents with   Vascular Access Problem    Sherri Dixon is a 43 y.o. female w/ ESRD on dialysis since December presenting to ED with complaint of accidental removal of tunneled dialysis catheter right femoral groin.  She goes to dialysis on Tuesday, Thursday and Saturday.  She has not missed any.  She reports she woke up this morning and found that the dialysis catheter dislodged and it fell out of her when she stood up.  She denies any persistent bleeding.  She is on Eliquis  for DVT.  She had a graft placed by vascular surgery in her right arm on 4/23 for future use - told it needs 6 weeks to mature.  HPI     Home Medications Prior to Admission medications   Medication Sig Start Date End Date Taking? Authorizing Provider  apixaban  (ELIQUIS ) 5 MG TABS tablet Take 1 tablet (5 mg total) by mouth 2 (two) times daily. 10/06/23   Singh, Prashant K, MD  Blood Glucose Monitoring Suppl (BLOOD GLUCOSE MONITOR SYSTEM) w/Device KIT Use as directed in the morning, at noon, and at bedtime. 10/06/23   Singh, Prashant K, MD  Continuous Blood Gluc Sensor (DEXCOM G6 SENSOR) MISC by Does not apply route. 08/13/22   [provider]  diphenhydrAMINE  (BENADRYL ) 25 mg capsule Take 1 capsule (25 mg total) by mouth 3 (three) times a week for 90 doses. Prior to dialysis Patient taking differently: Take 50 mg by mouth daily as needed for allergies or itching. Prior to dialysis 10/14/23 05/10/24  [place], Rema Care, DO  famotidine  (PEPCID ) 20 MG tablet Take 1 tablet (20 mg total) by mouth 3 (three) times a week. Prior to dialysis Patient taking differently: Take 20 mg by mouth 4 (four) times a week. Mon, Wed, Fri, and Sun (non-dialysis days) 10/14/23   [place], Eric J, DO  gabapentin  (NEURONTIN ) 100 MG capsule Take 1 capsule (100 mg total) by mouth at  bedtime. 10/14/23 01/12/24  [place], Rema Care, DO  hydrALAZINE  (APRESOLINE ) 100 MG tablet Take 1 tablet (100 mg total) by mouth every 8 (eight) hours. Patient taking differently: Take 100 mg by mouth See admin instructions. Take 100 mg once daily on non-dialysis days (Mon, Wed, Fri, and Sun) 10/06/23   Cala Castleman, MD  insulin  aspart (NOVOLOG ) 100 UNIT/ML FlexPen Use as directed before each meal 3 times a day, 140-199 - 2 units, 200-250 - 4 units, 251-299 - 6 units,  300-349 - 8 units,  350 or above 10 units. Patient taking differently: Inject 2-8 Units into the skin 3 (three) times daily with meals. 10/06/23   Singh, Prashant K, MD  Insulin  Pen Needle 32G X 4 MM MISC Use as directed 4 times a day for subcutaneous insulin  injection. 10/06/23   Singh, Prashant K, MD  losartan (COZAAR) 25 MG tablet Take 25 mg by mouth at bedtime. 12/01/23   [provider]  nitroGLYCERIN  (NITROSTAT ) 0.4 MG SL tablet Take by mouth. 04/17/19   [provider]  omeprazole  (PRILOSEC) 20 MG capsule TAKE 1 CAPSULE (20 MG TOTAL) BY MOUTH 2 (TWO) TIMES DAILY BEFORE A MEAL. 04/14/23   Antonio Baumgarten, NP  oxyCODONE -acetaminophen  (PERCOCET) 5-325 MG tablet Take 1 tablet by mouth every 6 (six) hours as needed for severe pain (pain score 7-10). 01/20/24 01/19/25  Deneen Finical, PA-C  rosuvastatin  (CRESTOR ) 10 MG tablet Take 1 tablet (10 mg total) by mouth daily. 10/15/23   [place], Rema Care, DO  sevelamer (RENAGEL) 800 MG tablet Take 800 mg by mouth 3 (three) times daily with meals.    [provider]  VITAMIN D , CHOLECALCIFEROL, PO Take 1 capsule by mouth Every Tuesday,Thursday,and Saturday with dialysis.    [provider]      Allergies    Amlodipine, Insulin  glargine, and Humalog  kwikpen [insulin  lispro]    Review of Systems   Review of Systems  Physical Exam Updated Vital Signs BP (!) 168/91 (BP Location: Left Arm)   Pulse 96   Temp 98 F (36.7 C)   Resp 18   SpO2 100%  Physical  Exam Constitutional:      General: She is not in acute distress. HENT:     Head: Normocephalic and atraumatic.  Eyes:     Conjunctiva/sclera: Conjunctivae normal.     Pupils: Pupils are equal, round, and reactive to light.  Cardiovascular:     Rate and Rhythm: Normal rate and regular rhythm.  Pulmonary:     Effort: Pulmonary effort is normal. No respiratory distress.  Abdominal:     General: There is no distension.     Tenderness: There is no abdominal tenderness.  Musculoskeletal:     Comments: Tunneled catheter site with no active bleeding, no underlying hematoma or tenderness, no palpable thrill of femoral artery  Skin:    General: Skin is warm and dry.  Neurological:     General: No focal deficit present.     Mental Status: She is alert. Mental status is at baseline.  Psychiatric:        Mood and Affect: Mood normal.        Behavior: Behavior normal.     ED Results / Procedures / Treatments   Labs (all labs ordered are listed, but only abnormal results are displayed) Labs Reviewed - No data to display  EKG None  Radiology No results found.  Procedures Procedures    Medications Ordered in ED Medications - No data to display  ED Course/ Medical Decision Making/ A&P Clinical Course as of 02/27/24 1340  Sat Feb 27, 2024  1055 Dr Fulton Job vascular surgery by phone reports that this graft site is not matured enough to use and likely would require 3 months.  The patient will need a new temporary dialysis access site, which I can place likely in the operating room Monday afternoon.  Dr. Fulton Job told me that his team will contact the patient Monday morning to confirm a time for her operating procedure.  In the meantime patient is instructed to stop using Eliquis  today until her procedure, per Dr. Fulton Job.  She is also to be n.p.o. at midnight.  The patient is comfortable with these instructions and comfortable going home. [MT]    Clinical Course User Index [MT] [name], MD                                 Medical Decision Making  43 year old here with accidental dislodgment of tunneled dialysis catheter.  She is otherwise well-appearing.  Do not see evidence of hematoma formation or arterial injury or bleed on clinical exam.  No evidence of infection.  I will discuss with nephrology regarding next steps for dialysis access.  Patient is not due for next Alysis until Tuesday, in 3 days.  Patient  does make a small amount of urine every other day.  No emergent indication for dialysis at this time.        Final Clinical Impression(s) / ED Diagnoses Final diagnoses:  ESRD (end stage renal disease) (HCC)  Hemodialysis catheter dysfunction, initial encounter [facility])    Rx / DC Orders ED Discharge Orders     None         [name], MD 02/27/24 1340

## 2024-02-27 NOTE — Discharge Instructions (Signed)
 The vascular surgery team should call you MONDAY morning to discuss a time Monday afternoon for likely operation to place a new dialysis catheter.  Please stop taking your Eliquis  today until you are instructed to restart it after your surgery.  You should also not eat or drink anything after midnight on Sunday night (before your surgery).  You should return to the ER if you have new or worsening pain in your right groin, especially if there is swelling or a large bruise developing along your thigh or leg.  This could be a sign of ongoing bleeding under the skin.  You should apply firm pressure over the site if this happens.

## 2024-02-27 NOTE — Progress Notes (Signed)
 I was called that the patient pulled out her right femoral dialysis catheter placed by Dr. Susi Eric.  No urgent need for dialysis.  Will place on the PV schedule for Monday for placement of new femoral tunneled dialysis catheter.  Discussed holding her Eliquis  with the ED.  N.p.o. after midnight on Sunday.  Her fistula is only about 45 weeks old and not ready for use.  Young Hensen, MD [facility] [place] Office: [phone]   Young Hensen

## 2024-02-29 ENCOUNTER — Ambulatory Visit (HOSPITAL_COMMUNITY)
Admission: RE | Admit: 2024-02-29 | Discharge: 2024-02-29 | Disposition: A | Discharge status: Home / Self Care | Attending: Vascular Surgery | Admitting: Vascular Surgery

## 2024-02-29 ENCOUNTER — Encounter (HOSPITAL_COMMUNITY): Payer: Self-pay | Admitting: Vascular Surgery

## 2024-02-29 ENCOUNTER — Encounter (HOSPITAL_COMMUNITY)
Admission: RE | Disposition: A | Discharge status: Home / Self Care | Payer: Self-pay | Source: Home / Self Care | Attending: Vascular Surgery

## 2024-02-29 ENCOUNTER — Other Ambulatory Visit: Payer: Self-pay

## 2024-02-29 DIAGNOSIS — N186 End stage renal disease: Secondary | ICD-10-CM

## 2024-02-29 DIAGNOSIS — T8242XA Displacement of vascular dialysis catheter, initial encounter: Secondary | ICD-10-CM

## 2024-02-29 DIAGNOSIS — Y839 Surgical procedure, unspecified as the cause of abnormal reaction of the patient, or of later complication, without mention of misadventure at the time of the procedure: Secondary | ICD-10-CM | POA: Insufficient documentation

## 2024-02-29 DIAGNOSIS — Z992 Dependence on renal dialysis: Secondary | ICD-10-CM | POA: Diagnosis not present

## 2024-02-29 HISTORY — PX: TEMPORARY DIALYSIS CATHETER: CATH118312

## 2024-02-29 HISTORY — PX: DIALYSIS/PERMA CATHETER INSERTION: CATH118288

## 2024-02-29 LAB — GLUCOSE, CAPILLARY
Glucose-Capillary: 126 mg/dL — ABNORMAL HIGH (ref 70–99)
Glucose-Capillary: 82 mg/dL (ref 70–99)
Glucose-Capillary: 83 mg/dL (ref 70–99)
Glucose-Capillary: 109 mg/dL — ABNORMAL HIGH (ref 70–99)
Glucose-Capillary: 171 mg/dL — ABNORMAL HIGH (ref 70–99)

## 2024-02-29 LAB — POCT I-STAT, CHEM 8
BUN: 35 mg/dL — ABNORMAL HIGH (ref 6–20)
Calcium, Ion: 0.97 mmol/L — ABNORMAL LOW (ref 1.15–1.40)
Chloride: 100 mmol/L (ref 98–111)
Creatinine, Ser: 8.3 mg/dL — ABNORMAL HIGH (ref 0.44–1.00)
Glucose, Bld: 103 mg/dL — ABNORMAL HIGH (ref 70–99)
HCT: 26 % — ABNORMAL LOW (ref 36.0–46.0)
Hemoglobin: 8.8 g/dL — ABNORMAL LOW (ref 12.0–15.0)
Potassium: 4.2 mmol/L (ref 3.5–5.1)
Sodium: 134 mmol/L — ABNORMAL LOW (ref 135–145)
TCO2: 22 mmol/L (ref 22–32)

## 2024-02-29 LAB — HCG, SERUM, QUALITATIVE: Preg, Serum: NEGATIVE

## 2024-02-29 SURGERY — TEMPORARY DIALYSIS CATHETER
Anesthesia: Moderate Sedation

## 2024-02-29 MED ORDER — SODIUM CHLORIDE 0.9% FLUSH: 3.0000 mL | Freq: Two times a day (BID) | INTRAVENOUS | Status: DC

## 2024-02-29 MED ORDER — HEPARIN (PORCINE) IN NACL 1000-0.9 UT/500ML-% IV SOLN
INTRAVENOUS | Status: DC | PRN
  Administered 2024-02-29: 500 mL

## 2024-02-29 MED ORDER — SODIUM CHLORIDE 0.9% FLUSH: 3.0000 mL | INTRAVENOUS | Status: DC | PRN

## 2024-02-29 MED ORDER — DEXTROSE 50 % IV SOLN
INTRAVENOUS | Status: AC
  Administered 2024-02-29: 25 mL via INTRAVENOUS
  Filled 2024-02-29: qty 50

## 2024-02-29 MED ORDER — HYDRALAZINE HCL 20 MG/ML IJ SOLN
INTRAMUSCULAR | Status: DC | PRN
  Administered 2024-02-29: 20 mg via INTRAVENOUS

## 2024-02-29 MED ORDER — LIDOCAINE HCL (PF) 1 % IJ SOLN
INTRAMUSCULAR | Status: AC
  Filled 2024-02-29: qty 30

## 2024-02-29 MED ORDER — MIDAZOLAM HCL 2 MG/2ML IJ SOLN
INTRAMUSCULAR | Status: DC | PRN
  Administered 2024-02-29: 1 mg via INTRAVENOUS

## 2024-02-29 MED ORDER — HYDRALAZINE HCL 20 MG/ML IJ SOLN
INTRAMUSCULAR | Status: AC
  Filled 2024-02-29: qty 1

## 2024-02-29 MED ORDER — FENTANYL CITRATE (PF) 100 MCG/2ML IJ SOLN
INTRAMUSCULAR | Status: AC
  Filled 2024-02-29: qty 2

## 2024-02-29 MED ORDER — LIDOCAINE HCL (PF) 1 % IJ SOLN
INTRAMUSCULAR | Status: DC | PRN
  Administered 2024-02-29: 10 mL via INTRADERMAL
  Administered 2024-02-29: 15 mL via INTRADERMAL

## 2024-02-29 MED ORDER — FENTANYL CITRATE (PF) 100 MCG/2ML IJ SOLN
INTRAMUSCULAR | Status: DC | PRN
  Administered 2024-02-29: 50 ug via INTRAVENOUS

## 2024-02-29 MED ORDER — IODIXANOL 320 MG/ML IV SOLN
INTRAVENOUS | Status: DC | PRN
  Administered 2024-02-29: 10 mL

## 2024-02-29 MED ORDER — HEPARIN SODIUM (PORCINE) 1000 UNIT/ML IJ SOLN
INTRAMUSCULAR | Status: AC
Start: 2024-02-29 — End: ?
  Filled 2024-02-29: qty 10

## 2024-02-29 MED ORDER — DEXTROSE 50 % IV SOLN: 25.0000 mL | Freq: Once | INTRAVENOUS | Status: AC

## 2024-02-29 MED ORDER — SODIUM CHLORIDE 0.9 % IV SOLN: 250.0000 mL | INTRAVENOUS | Status: DC | PRN

## 2024-02-29 MED ORDER — MIDAZOLAM HCL 2 MG/2ML IJ SOLN
INTRAMUSCULAR | Status: AC
  Filled 2024-02-29: qty 2

## 2024-02-29 SURGICAL SUPPLY — 6 items
COVER DOME SNAP 22 D (MISCELLANEOUS) IMPLANT
KIT MICROPUNCTURE NIT STIFF (SHEATH) IMPLANT
KIT PALINDROME-P 55CM (CATHETERS) IMPLANT
SHEATH PROBE COVER 6X72 (BAG) IMPLANT
TRAY PV CATH (CUSTOM PROCEDURE TRAY) ×1 IMPLANT
WIRE BENTSON .035X145CM (WIRE) IMPLANT

## 2024-02-29 NOTE — Interval H&P Note (Signed)
 History and Physical Interval Note:  02/29/2024 3:46 PM  Sherri Dixon  has presented today for surgery, with the diagnosis of ESRD.  The various methods of treatment have been discussed with the patient and family. After consideration of risks, benefits and other options for treatment, the patient has consented to  Procedure(s): TEMPORARY DIALYSIS CATHETER (N/A) DIALYSIS/PERMA CATHETER INSERTION (N/A) as a surgical intervention.  The patient's history has been reviewed, patient examined, no change in status, stable for surgery.  I have reviewed the patient's chart and labs.  Questions were answered to the patient's satisfaction.     [name]

## 2024-02-29 NOTE — Op Note (Signed)
    Patient name: Sherri Dixon MRN: [id number] DOB: [birth year] Sex: female  02/29/2024 Pre-operative Diagnosis: ESRD on HD Post-operative diagnosis:  Same Surgeon:  [name], MD Procedure Performed:  Ultrasound-guided right common femoral vein access Right femoral tunneled dialysis catheter placement under fluoroscopic guidance Central venogram and pulmonary artery angiogram 14 minutes of moderate sedation with fentanyl  and Versed    Indications: Sherri Dixon is a 43 year old female with ESRD on HD who had a recent right brachiocephalic fistula placed.  She had a right femoral TDC in place which became dislodged over the weekend.  The right aVF was placed about 5 weeks ago and is not currently mature there was discussed that she would need replacement of a tunneled dialysis catheter for access.  Risk and benefits of tunneled dialysis catheter placement were reviewed, she expressed understanding and elected to proceed.  She reported that she has been having some new cough and had a family member passed away from pulmonary embolism recently and asked me if I could evaluate her for any clot burden given her multiple previous central catheters.  Findings:  Widely patent and compressible right common femoral vein.  Excellent placement of the right femoral TDC at the atriocaval junction. No evidence of pulmonary embolism noted pulmonary angiogram taken from central venous catheter.   Procedure:   The patient was brought to the cath lab and positioned supine on the table.  The right groin and thigh were prepped and draped in the usual sterile fashion. Using ultrasound guidance the right common femoral vein was accessed with micropuncture technique.  Through the micropuncture sheath, the guidewire was advanced into the inferior vena cava A small incision was made around the skin access point.  The access point was serially dilated under direct fluoroscopic guidance.  A peel-away sheath was  introduced into the inferior vena cava under fluoroscopic guidance.  A counterincision was made in the lateral thigh.  A 55 cm tunnel dialysis catheter was then subcutaneously from the counterincision to the venous access site.  The tunneling device was removed and the catheter fed through the peel-away sheath into the inferior vena cava.  The peel-away sheath was removed and the catheter gently pulled back.  Adequate position was confirmed with x-ray.  The catheter was tested and found to aspirate and flush with ease.  A pulmonary artery angiogram was then obtained via the catheter which did not demonstrate any evidence of pulmonary embolism or obstruction of flow.   The catheter was sutured to the skin and the neck incision was closed with 4-0 Monocryl.  The catheter was then capped and heparin  locked.   Contrast: 10  Sedation: 14 minutes  Impression: Excellent placement of the right femoral TDC at the atrial caval junction.   [name] MD [facility] [place] Office: [phone]

## 2024-03-01 ENCOUNTER — Other Ambulatory Visit: Payer: Self-pay | Admitting: *Deleted

## 2024-03-01 DIAGNOSIS — N186 End stage renal disease: Secondary | ICD-10-CM

## 2024-03-02 MED FILL — Heparin Sodium (Porcine) Inj 1000 Unit/ML: INTRAMUSCULAR | Qty: 10 | Status: AC

## 2024-03-04 ENCOUNTER — Encounter (HOSPITAL_COMMUNITY)

## 2024-03-04 ENCOUNTER — Encounter

## 2024-03-18 ENCOUNTER — Ambulatory Visit: Attending: Vascular Surgery | Admitting: Physician Assistant

## 2024-03-18 ENCOUNTER — Ambulatory Visit (HOSPITAL_COMMUNITY)
Admission: RE | Admit: 2024-03-18 | Discharge: 2024-03-18 | Disposition: A | Discharge status: Home / Self Care | Source: Ambulatory Visit | Attending: Vascular Surgery | Admitting: Vascular Surgery

## 2024-03-18 VITALS — BP 167/91 | HR 75 | Temp 98.2°F | Ht 65.0 in | Wt 205.0 lb

## 2024-03-18 DIAGNOSIS — N186 End stage renal disease: Secondary | ICD-10-CM

## 2024-03-18 NOTE — Progress Notes (Signed)
 POST OPERATIVE OFFICE NOTE    CC:  F/u for surgery  HPI:  This is a 43 y.o. female who is s/p right BC AVF on 01/20/2024 by Dr. Susi Eric.   She did have a right femoral TDC placed on 02/29/2024 by Dr. Susi Eric.    She has history of renal cell carcinoma status post right renal nephrectomy in 2020   Pt states she does not have pain/numbness in the right hand.    She states that she was out of town this past weekend and she had some bleeding from around the catheter exit site but this resolved.  She is now having some clear drainage from the exit site.  She has not had any fever or chills.    The pt is on dialysis T/T/S at [facility] [address] location.   Allergies  Allergen Reactions   Amlodipine Swelling    Pelvis swells   Insulin  Glargine Other (See Comments) and Hives   Humalog  Kwikpen [Insulin  Lispro] Other (See Comments)    75/50---swelling and ulcers in mouth     Current Outpatient Medications  Medication Sig Dispense Refill   apixaban  (ELIQUIS ) 5 MG TABS tablet Take 1 tablet (5 mg total) by mouth 2 (two) times daily. 60 tablet 0   Blood Glucose Monitoring Suppl (BLOOD GLUCOSE MONITOR SYSTEM) w/Device KIT Use as directed in the morning, at noon, and at bedtime. 1 kit 0   Continuous Blood Gluc Sensor (DEXCOM G6 SENSOR) MISC by Does not apply route.     diphenhydrAMINE  (BENADRYL ) 25 mg capsule Take 1 capsule (25 mg total) by mouth 3 (three) times a week for 90 doses. Prior to dialysis (Patient taking differently: Take 50 mg by mouth daily as needed for allergies or itching. Prior to dialysis) 90 capsule 0   famotidine  (PEPCID ) 20 MG tablet Take 1 tablet (20 mg total) by mouth 3 (three) times a week. Prior to dialysis (Patient taking differently: Take 20 mg by mouth 4 (four) times a week. Mon, Wed, Fri, and Sun (non-dialysis days)) 1 tablet 0   gabapentin  (NEURONTIN ) 100 MG capsule Take 1 capsule (100 mg total) by mouth at bedtime. 90 capsule 0   hydrALAZINE  (APRESOLINE ) 100 MG tablet Take  1 tablet (100 mg total) by mouth every 8 (eight) hours. (Patient taking differently: Take 100 mg by mouth See admin instructions. Take 100 mg once daily on non-dialysis days (Mon, Wed, Fri, and Sun)) 90 tablet 0   insulin  aspart (NOVOLOG ) 100 UNIT/ML FlexPen Use as directed before each meal 3 times a day, 140-199 - 2 units, 200-250 - 4 units, 251-299 - 6 units,  300-349 - 8 units,  350 or above 10 units. (Patient taking differently: Inject 2-8 Units into the skin 3 (three) times daily with meals.) 3 mL 0   Insulin  Pen Needle 32G X 4 MM MISC Use as directed 4 times a day for subcutaneous insulin  injection. 100 each 0   losartan (COZAAR) 25 MG tablet Take 25 mg by mouth at bedtime.     nitroGLYCERIN  (NITROSTAT ) 0.4 MG SL tablet Take by mouth.     omeprazole  (PRILOSEC) 20 MG capsule TAKE 1 CAPSULE (20 MG TOTAL) BY MOUTH 2 (TWO) TIMES DAILY BEFORE A MEAL. 180 capsule 1   oxyCODONE -acetaminophen  (PERCOCET) 5-325 MG tablet Take 1 tablet by mouth every 6 (six) hours as needed for severe pain (pain score 7-10). 10 tablet 0   rosuvastatin  (CRESTOR ) 10 MG tablet Take 1 tablet (10 mg total) by mouth daily. 30 tablet  11   sevelamer (RENAGEL) 800 MG tablet Take 800 mg by mouth 3 (three) times daily with meals.     VITAMIN D , CHOLECALCIFEROL, PO Take 1 capsule by mouth Every Tuesday,Thursday,and Saturday with dialysis.     No current facility-administered medications for this visit.     ROS:  See HPI  Physical Exam:  Today's Vitals   03/18/24 1336  BP: (!) 167/91  Pulse: 75  Temp: 98.2 F (36.8 C)  SpO2: 98%  Weight: 205 lb (93 kg)  Height: 5' 5 (1.651 m)   Body mass index is 34.11 kg/m.   Incision:  well healed Extremities:   There is a palpable right radial pulse.   Motor and sensory are in tact.   There is a thrill present.  Access is  easily palpable distally but more difficult to palpate more proximally   Dialysis Duplex on 03/18/2024: Findings:   +--------------------+----------+-----------------+--------+  AVF                PSV (cm/s)Flow Vol (mL/min)Comments  +--------------------+----------+-----------------+--------+  Native artery inflow   132           536                 +--------------------+----------+-----------------+--------+  AVF Anastomosis        295                               +--------------------+----------+-----------------+--------+     +------------+----------+-------------+----------+-------------------+  OUTFLOW VEINPSV (cm/s)Diameter (cm)Depth (cm)     Describe        +------------+----------+-------------+----------+-------------------+  Prox UA        125        0.31        0.84                        +------------+----------+-------------+----------+-------------------+  Mid UA          63        0.46        0.41                        +------------+----------+-------------+----------+-------------------+  Dist UA        782        0.16        0.28   residual lumen <59mm  +------------+----------+-------------+----------+-------------------+   Assessment/Plan:  This is a 43 y.o. female who is s/p: right BC AVF on 01/20/2024 by Dr. Susi Eric.   She did have a right femoral TDC placed on 02/29/2024 by Dr. Susi Eric.    -the pt does not have evidence of steal. -on duplex, the fistula is slow to mature.  I discussed this with the pt and we will set her up for fistulogram with possible intervention.   -her TDC was inspected.  There was no erythema around the catheter exit site.  I did not appreciate any drainage from this area.  Discussed with her that if she starts to see redness or purulent drainage or starts having fevers, she needs to inform her nephrologist.  -discussed with pt that access does not last forever and will need intervention or even new access at some point.   Of note, pt has hx of DVT and has been on Xarelto  in the past.  She has been changed to Eliquis   and has not been taking this due to cost.  She would  like to get back on Xarelto  but her PCP wanted her to discuss this with her nephrologist.   I discussed with her that if she gets started back on this, we would stop it for a couple of days for her procedure.  She expressed good understanding.    Maryanna Smart, [facility] [phone]  Clinic MD:  Susi Eric

## 2024-03-18 NOTE — H&P (View-Only) (Signed)
 POST OPERATIVE OFFICE NOTE    CC:  F/u for surgery  HPI:  This is a 43 y.o. female who is s/p right BC AVF on 01/20/2024 by Dr. Susi Eric.   She did have a right femoral TDC placed on 02/29/2024 by Dr. Susi Eric.    She has history of renal cell carcinoma status post right renal nephrectomy in 2020   Pt states she does not have pain/numbness in the right hand.    She states that she was out of town this past weekend and she had some bleeding from around the catheter exit site but this resolved.  She is now having some clear drainage from the exit site.  She has not had any fever or chills.    The pt is on dialysis T/T/S at [facility] [address] location.   Allergies  Allergen Reactions   Amlodipine Swelling    Pelvis swells   Insulin  Glargine Other (See Comments) and Hives   Humalog  Kwikpen [Insulin  Lispro] Other (See Comments)    75/50---swelling and ulcers in mouth     Current Outpatient Medications  Medication Sig Dispense Refill   apixaban  (ELIQUIS ) 5 MG TABS tablet Take 1 tablet (5 mg total) by mouth 2 (two) times daily. 60 tablet 0   Blood Glucose Monitoring Suppl (BLOOD GLUCOSE MONITOR SYSTEM) w/Device KIT Use as directed in the morning, at noon, and at bedtime. 1 kit 0   Continuous Blood Gluc Sensor (DEXCOM G6 SENSOR) MISC by Does not apply route.     diphenhydrAMINE  (BENADRYL ) 25 mg capsule Take 1 capsule (25 mg total) by mouth 3 (three) times a week for 90 doses. Prior to dialysis (Patient taking differently: Take 50 mg by mouth daily as needed for allergies or itching. Prior to dialysis) 90 capsule 0   famotidine  (PEPCID ) 20 MG tablet Take 1 tablet (20 mg total) by mouth 3 (three) times a week. Prior to dialysis (Patient taking differently: Take 20 mg by mouth 4 (four) times a week. Mon, Wed, Fri, and Sun (non-dialysis days)) 1 tablet 0   gabapentin  (NEURONTIN ) 100 MG capsule Take 1 capsule (100 mg total) by mouth at bedtime. 90 capsule 0   hydrALAZINE  (APRESOLINE ) 100 MG tablet Take  1 tablet (100 mg total) by mouth every 8 (eight) hours. (Patient taking differently: Take 100 mg by mouth See admin instructions. Take 100 mg once daily on non-dialysis days (Mon, Wed, Fri, and Sun)) 90 tablet 0   insulin  aspart (NOVOLOG ) 100 UNIT/ML FlexPen Use as directed before each meal 3 times a day, 140-199 - 2 units, 200-250 - 4 units, 251-299 - 6 units,  300-349 - 8 units,  350 or above 10 units. (Patient taking differently: Inject 2-8 Units into the skin 3 (three) times daily with meals.) 3 mL 0   Insulin  Pen Needle 32G X 4 MM MISC Use as directed 4 times a day for subcutaneous insulin  injection. 100 each 0   losartan (COZAAR) 25 MG tablet Take 25 mg by mouth at bedtime.     nitroGLYCERIN  (NITROSTAT ) 0.4 MG SL tablet Take by mouth.     omeprazole  (PRILOSEC) 20 MG capsule TAKE 1 CAPSULE (20 MG TOTAL) BY MOUTH 2 (TWO) TIMES DAILY BEFORE A MEAL. 180 capsule 1   oxyCODONE -acetaminophen  (PERCOCET) 5-325 MG tablet Take 1 tablet by mouth every 6 (six) hours as needed for severe pain (pain score 7-10). 10 tablet 0   rosuvastatin  (CRESTOR ) 10 MG tablet Take 1 tablet (10 mg total) by mouth daily. 30 tablet  11   sevelamer (RENAGEL) 800 MG tablet Take 800 mg by mouth 3 (three) times daily with meals.     VITAMIN D , CHOLECALCIFEROL, PO Take 1 capsule by mouth Every Tuesday,Thursday,and Saturday with dialysis.     No current facility-administered medications for this visit.     ROS:  See HPI  Physical Exam:  Today's Vitals   03/18/24 1336  BP: (!) 167/91  Pulse: 75  Temp: 98.2 F (36.8 C)  SpO2: 98%  Weight: 205 lb (93 kg)  Height: 5' 5 (1.651 m)   Body mass index is 34.11 kg/m.   Incision:  well healed Extremities:   There is a palpable right radial pulse.   Motor and sensory are in tact.   There is a thrill present.  Access is  easily palpable distally but more difficult to palpate more proximally   Dialysis Duplex on 03/18/2024: Findings:   +--------------------+----------+-----------------+--------+  AVF                PSV (cm/s)Flow Vol (mL/min)Comments  +--------------------+----------+-----------------+--------+  Native artery inflow   132           536                 +--------------------+----------+-----------------+--------+  AVF Anastomosis        295                               +--------------------+----------+-----------------+--------+     +------------+----------+-------------+----------+-------------------+  OUTFLOW VEINPSV (cm/s)Diameter (cm)Depth (cm)     Describe        +------------+----------+-------------+----------+-------------------+  Prox UA        125        0.31        0.84                        +------------+----------+-------------+----------+-------------------+  Mid UA          63        0.46        0.41                        +------------+----------+-------------+----------+-------------------+  Dist UA        782        0.16        0.28   residual lumen <59mm  +------------+----------+-------------+----------+-------------------+   Assessment/Plan:  This is a 43 y.o. female who is s/p: right BC AVF on 01/20/2024 by Dr. Susi Eric.   She did have a right femoral TDC placed on 02/29/2024 by Dr. Susi Eric.    -the pt does not have evidence of steal. -on duplex, the fistula is slow to mature.  I discussed this with the pt and we will set her up for fistulogram with possible intervention.   -her TDC was inspected.  There was no erythema around the catheter exit site.  I did not appreciate any drainage from this area.  Discussed with her that if she starts to see redness or purulent drainage or starts having fevers, she needs to inform her nephrologist.  -discussed with pt that access does not last forever and will need intervention or even new access at some point.   Of note, pt has hx of DVT and has been on Xarelto  in the past.  She has been changed to Eliquis   and has not been taking this due to cost.  She would  like to get back on Xarelto  but her PCP wanted her to discuss this with her nephrologist.   I discussed with her that if she gets started back on this, we would stop it for a couple of days for her procedure.  She expressed good understanding.    Maryanna Smart, [facility] [phone]  Clinic MD:  Susi Eric

## 2024-03-28 ENCOUNTER — Ambulatory Visit (HOSPITAL_COMMUNITY)
Admission: RE | Admit: 2024-03-28 | Discharge: 2024-03-28 | Disposition: A | Discharge status: Home / Self Care | Attending: Vascular Surgery | Admitting: Vascular Surgery

## 2024-03-28 ENCOUNTER — Encounter (HOSPITAL_COMMUNITY)
Admission: RE | Disposition: A | Discharge status: Home / Self Care | Payer: Self-pay | Source: Home / Self Care | Attending: Vascular Surgery

## 2024-03-28 ENCOUNTER — Encounter (HOSPITAL_COMMUNITY): Payer: Self-pay | Admitting: Vascular Surgery

## 2024-03-28 ENCOUNTER — Other Ambulatory Visit: Payer: Self-pay

## 2024-03-28 DIAGNOSIS — Y832 Surgical operation with anastomosis, bypass or graft as the cause of abnormal reaction of the patient, or of later complication, without mention of misadventure at the time of the procedure: Secondary | ICD-10-CM | POA: Insufficient documentation

## 2024-03-28 DIAGNOSIS — N186 End stage renal disease: Secondary | ICD-10-CM

## 2024-03-28 DIAGNOSIS — Z7901 Long term (current) use of anticoagulants: Secondary | ICD-10-CM | POA: Insufficient documentation

## 2024-03-28 DIAGNOSIS — Z905 Acquired absence of kidney: Secondary | ICD-10-CM | POA: Insufficient documentation

## 2024-03-28 DIAGNOSIS — Z992 Dependence on renal dialysis: Secondary | ICD-10-CM | POA: Diagnosis not present

## 2024-03-28 DIAGNOSIS — Z86718 Personal history of other venous thrombosis and embolism: Secondary | ICD-10-CM | POA: Diagnosis not present

## 2024-03-28 DIAGNOSIS — T82858A Stenosis of vascular prosthetic devices, implants and grafts, initial encounter: Secondary | ICD-10-CM

## 2024-03-28 HISTORY — PX: A/V SHUNT INTERVENTION: CATH118220

## 2024-03-28 HISTORY — PX: VENOUS ANGIOPLASTY: CATH118376

## 2024-03-28 LAB — GLUCOSE, CAPILLARY: Glucose-Capillary: 149 mg/dL — ABNORMAL HIGH (ref 70–99)

## 2024-03-28 SURGERY — A/V SHUNT INTERVENTION
Anesthesia: LOCAL | Laterality: Right

## 2024-03-28 MED ORDER — LIDOCAINE HCL (PF) 1 % IJ SOLN
INTRAMUSCULAR | Status: DC | PRN
Start: 2024-03-28 — End: 2024-03-28
  Administered 2024-03-28 (×2): 5 mL via SUBCUTANEOUS

## 2024-03-28 MED ORDER — IODIXANOL 320 MG/ML IV SOLN
INTRAVENOUS | Status: DC | PRN
  Administered 2024-03-28: 30 mL via INTRAVENOUS

## 2024-03-28 MED ORDER — HEPARIN (PORCINE) IN NACL 1000-0.9 UT/500ML-% IV SOLN
INTRAVENOUS | Status: DC | PRN
Start: 2024-03-28 — End: 2024-03-28
  Administered 2024-03-28: 500 mL

## 2024-03-28 MED ORDER — LIDOCAINE HCL (PF) 1 % IJ SOLN
INTRAMUSCULAR | Status: AC
  Filled 2024-03-28: qty 30

## 2024-03-28 SURGICAL SUPPLY — 10 items
BALLOON MUSTANG 6.0X40 75 (BALLOONS) IMPLANT
CATH ANGIO 5F BER2 65CM (CATHETERS) IMPLANT
KIT ENCORE 26 ADVANTAGE (KITS) IMPLANT
KIT MICROPUNCTURE NIT STIFF (SHEATH) IMPLANT
SHEATH PINNACLE R/O II 6F 4CM (SHEATH) IMPLANT
SHEATH PROBE COVER 6X72 (BAG) IMPLANT
STOPCOCK MORSE 400PSI 3WAY (MISCELLANEOUS) IMPLANT
TRAY PV CATH (CUSTOM PROCEDURE TRAY) ×2 IMPLANT
TUBING CIL FLEX 10 FLL-RA (TUBING) IMPLANT
WIRE BENTSON .035X145CM (WIRE) IMPLANT

## 2024-03-28 NOTE — Op Note (Addendum)
 DATE OF SERVICE: 03/28/2024  PATIENT:  Sherri Dixon  43 y.o. female  PRE-OPERATIVE DIAGNOSIS:  ESRD  POST-OPERATIVE DIAGNOSIS:  Same  PROCEDURE:   1) ultrasound guided right arm AVF access x 2 (CPT [phone]) 2) right arm fistulagram with peripheral angioplasty (CPT [phone])  SURGEON:  Surgeons and Role:    * Magda Debby SAILOR, MD - Primary  ASSISTANT: none  ANESTHESIA:   none  EBL: min  BLOOD ADMINISTERED:none  DRAINS: none   LOCAL MEDICATIONS USED:  LIDOCAINE    SPECIMEN:  none  COUNTS: confirmed correct.  TOURNIQUET:  none  PATIENT DISPOSITION:  PACU - hemodynamically stable.   Delay start of Pharmacological VTE agent (>24hrs) due to surgical blood loss or risk of bleeding: no  INDICATION FOR PROCEDURE: Sherri Dixon is a 43 y.o. female with ESRD and slow to mature R BC AVF. After careful discussion of risks, benefits, and alternatives the patient was offered fistulagram. The patient understood and wished to proceed.  OPERATIVE FINDINGS: No anastomotic issues. >95% stenosis in AVF several centimeters past its anastomosis. Remainder of cephalic vein is small. No subclavian or central venous stenosis appreciated.  DESCRIPTION OF PROCEDURE: After identification of the patient in the pre-operative holding area, the patient was transferred to the operating room. The patient was positioned supine on the operating room table. Anesthesia was induced. The right arm was prepped and draped in standard fashion. A surgical pause was performed confirming correct patient, procedure, and operative location.  1% lidocaine  was infiltrated into the area of planned access.  Duplex ultrasound was used to guide micropuncture access into the right arm AV fistula.  Fistulogram was performed in stations.  See above for details.  I was able to intervene on the stenosis with this access and so new access was made near the shoulder.  The same process was repeated, 1% lidocaine  infiltration followed by  duplex guided fistula access with micropuncture technique.  Access was then upsized to a 6 [place] sheath.  The lesion was crossed with a Bentson wire.  6 x 40 mm angioplasty was performed with good technical result.  All endovascular equipment was removed.  Figure-of-eight stitches were placed at the access with good hemostasis.  Bandages were applied.  Upon completion of the case instrument and sharps counts were confirmed correct. The patient was transferred to the PACU in good condition. I was present for all portions of the procedure.  FOLLOW UP PLAN: Follow-up in 4 to 6 weeks with PA and AV fistula duplex to evaluate patency of fistula.  The fistula is not ready for cannulation at this point, new access creation should be considered. Asked patient to discuss antibiotics at dialysis with her nephrology team to treat serous drainage from catheter.  Debby SAILOR. Magda, MD [facility] Phone Number: [phone] 03/28/2024 8:55 AM

## 2024-03-28 NOTE — Interval H&P Note (Signed)
 History and Physical Interval Note:  03/28/2024 8:54 AM  Sherri Dixon  has presented today for surgery, with the diagnosis of N18.6.  The various methods of treatment have been discussed with the patient and family. After consideration of risks, benefits and other options for treatment, the patient has consented to  Procedure(s) with comments: A/V SHUNT INTERVENTION (Right) VENOUS ANGIOPLASTY - Cephalic Vein as a surgical intervention.  The patient's history has been reviewed, patient examined, no change in status, stable for surgery.  I have reviewed the patient's chart and labs.  Questions were answered to the patient's satisfaction.     Debby LOISE Robertson

## 2024-04-04 NOTE — Progress Notes (Addendum)
 Patient ID:  Sherri Dixon is a 43 y.o. (DOB [birth year]) female.  Assessment and Plan   1. Primary hypertension   2. ESRD (end stage renal disease) on dialysis (*)   3. Type 2 diabetes mellitus with diabetic peripheral angiopathy and gangrene, with long-term current use of insulin  (*)   4. Chronic deep vein thrombosis (DVT) of popliteal vein of left lower extremity (*)   5. Chronic anticoagulation   6. Colon cancer screening    1-2 Hypertension BP under better control  Continue medications as prescribed. Continue daily ABPM, call with readings consistently > 130/80 81 mg ASA daily No added salt diet. Discussed ways to improve medication adherence. Encouraged/maintain heart healthy diet and active lifestyle with regular exercise program within physical limitations.  Goal is 30 5/7 days. F/u 3 months, sooner if needed   3 Continue medications Monitor BG closely  Diabetes Goals: Sugar between 70-110. A1C less then 7% Eat properly with low carborhydrate diet. Exercise 3-4 times weekly for 45 mins.  Eye exam yearly to check the small vessels for diabetes complications. Check feet every morning and night for wounds.  Blood pressure goal <130/80 LDL (bad cholesterol) goal 70-100 HDL (good cholesterol) goal >45 Triglycerides (fatty cholesterol) < 150 consistently take your medications F/u 3 months, sooner if needed   4-5 Our office will reach out to [facility]ol-[facility] again today Reviewed risks of missing any anticoagulation doses and she is aware   6 Referral to GI   Patient understands and agrees with plan. I have reviewed the information contained in this note and personally verified its accuracy.  MDM billing - I personally developed the plan of care based on documented medical decision making. Lyle DELENA Setters, NP   Follow up in about 3 months (around 07/05/2024) for F/U with labs.    Patient's Medications       * Accurate as of April 04, 2024  2:21 PM. Reflects  encounter med changes as of last refresh          Continued Medications      Instructions  ACCU-CHEK GUIDE test strip Generic drug: glucose blood  Use to monitor blood glucose 4 time(s) daily   ACCU-CHEK GUIDE w/Device Kit  Use as directed by provider to monitor blood sugars 4 times daily   acetaminophen  650 MG CR tablet Commonly known as: TYLENOL  ARTHRITIS,MAPAP  650 mg, Once as needed   apixaban  5 mg tablet Commonly known as: ELIQUIS   5 mg, Oral, 2 times a day   BD PEN NEEDLE NANO U/F 32G X 4 MM Misc Generic drug: Insulin  Pen Needle  SMARTSIG:1 Each SUB-Q 4 Times Daily   bisacodyl  5 mg EC tablet Commonly known as: BISACODYL ,DULCOLAX  5 mg, Daily as needed   budesonide-formoterol 160-4.5 mcg/actuation inhaler Commonly known as: SYMBICORT  2 puffs, 2 times a day as needed   calcitRIOL  0.25 mcg capsule Commonly known as: ROCALTROL   0.25 mcg, Daily   CENTRUM ADULT PO  1 each, Daily   cetirizine 10 mg chewable tablet Commonly known as: ZYRTEC,ALL DAY ALLERGY  10 mg, Daily   DEXCOM G7 RECEIVER Devi  1 each, Does not apply, Continuous   DEXCOM G7 SENSOR Misc  1 each, Does not apply, Every 10 days   diphenhydrAMINE  25 mg capsule Commonly known as: BANOPHEN ,BENADRYL   25 mg, Every 6 hours as needed   gabapentin  100 mg capsule Commonly known as: NEURONTIN   100 mg, Oral, At bedtime   hydrALAzine  HCl 25 mg tablet Commonly  known as: APRESOLINE   50 mg, Oral, Daily   Insulin  Degludec 100 UNIT/ML injection Commonly known as: TRESIBA   36 Units, Subcutaneous, Daily, MAX DAILY DOSE 50 UNITS.   losartan potassium 25 mg tablet Commonly known as: COZAAR  50 mg, Oral, Daily   metolazone 5 mg tablet Commonly known as: ZAROXOLYN  5 mg, Weekly at 0900   MIRCERA IJ  100 mcg   nitroGLYCERIN  0.4 mg SL tablet Commonly known as: NITROSTAT   USE AS DIRECTED *NEW PRESCRIPTION REQUEST*   NOVOLOG  FLEXPEN 100 UNIT/ML injection Generic drug: insulin  aspart  Inject 8  units into the skin BEFORE meals. If your blood glucose is >200, then inject 12 units.   omeprazole  20 mg capsule Commonly known as: PRILOSEC  20 mg, Oral, 2 times a day   ondansetron  4 mg disintegrating tablet Commonly known as: ZOFRAN -ODT  4 mg, Once as needed   PEPCID  20 MG tablet Generic drug: famotidine   Four times a week prior to dialysis   RELION LANCETS MICRO-THIN 33G Misc  Use to monitor blood glucose 4 time(s) daily   rosuvastatin  calcium  10 mg tablet Commonly known as: CRESTOR   10 mg, Oral, Every 24 hours scheduled   sevelamer 800 MG tablet Commonly known as: RENVELA  1 tablet, With meals   VITAMIN D3 ADULT GUMMIES PO  Daily        Orders Placed This Encounter  Procedures  . Ambulatory referral to Gastroenterology    Risks, benefits, and alternatives of the medications and treatment plan prescribed today were discussed, and patient expressed understanding. Plan follow-up as discussed or as needed if any worsening symptoms or change in condition.    A yearly preventative health exam was recommended and current age based recommendations were discussed.   Subjective   Patient ID:  Sherri Dixon is a 43 y.o. (DOB May 13, 1981) female    Patient presents with  . Allergic Reaction    Allergy whelps takes benadryl  but is wondering if zyrtec will help     HPI:    Pt here today for BP f/u   Hypertension/ESRD on dialysis  Taking medications as prescribed. Reports compliance 7/7 days.  On losartan 50mg  daily and hydralazine  50mg  once a day Denies headache, chest pain, exertional dyspnea, peripheral edema, or signs/symptoms of syncope or near syncope. Adheres to no added salt diet. Sees nephrologist, Dr Dalene.  Doing dialysis Tu, Thu and Sat each week  BP Readings from Last 3 Encounters:  04/04/24 144/86  03/30/24 (!) 154/86  03/16/24 (!) 170/104   Lab Results  Component Value Date   Creatinine 5.54 (H) 03/16/2024   BUN 28 (H) 03/16/2024   Sodium  136 03/16/2024   Potassium 4.5 03/16/2024   Chloride 95 (L) 03/16/2024   CO2 22 03/16/2024   T2DM Recently saw Marcelline Nigh, Endo NP Taking tresiba  and novolog  as prescribed, compliant with recent dosing changes made by endo  Diet: low carb/low sugar  Exercise: walking some  Denies PPPs, peripheral numbness/tingling and visual disturbance Last eye exam a few weeks ago Lab Results  Component Value Date   Hemoglobin A1c 12.5 (H) 03/16/2024    Wt Readings from Last 3 Encounters:  04/04/24 209 lb (94.8 kg)  03/30/24 206 lb (93.4 kg)  03/16/24 205 lb (93 kg)   Chronic DVT of LLE with chronic anticoagulation  Having issues paying for eliquis  but has applied for [facility] program and in meantime has been able to use $100 off coupon  Denies calf pain/swelling, CP/SOB, palpitations/tachycardia  Past Medical History, Past Surgery History, Allergies, Social History, and Family History were reviewed and updated.    Review of Systems  Constitutional:  Negative for activity change, fatigue and unexpected weight change.  HENT:  Negative for congestion, hearing loss and trouble swallowing.   Eyes:  Negative for visual disturbance.  Respiratory:  Negative for cough, chest tightness, shortness of breath and wheezing.   Cardiovascular:  Negative for chest pain and palpitations.  Gastrointestinal:  Negative for blood in stool, constipation, diarrhea, nausea and vomiting.  Endocrine: Negative for cold intolerance, heat intolerance, polydipsia, polyphagia and polyuria.  Genitourinary:  Negative for dysuria, frequency and urgency.  Musculoskeletal:  Negative for back pain and myalgias.  Skin:  Negative for rash and wound.  Allergic/Immunologic: Negative for environmental allergies.  Neurological:  Negative for dizziness, weakness, light-headedness and headaches.  Hematological: Negative.   Psychiatric/Behavioral:  Negative for confusion, sleep disturbance and suicidal ideas. The patient is not  nervous/anxious.     Objective   Vitals:   04/04/24 1321  BP: 144/86  Patient Position: Sitting  Pulse: 95  Resp: 16  Height: 5' 5 (1.651 m)  Weight: 209 lb (94.8 kg)  SpO2: 99%  BMI (Calculated): 34.8   Physical Exam Vitals and nursing note reviewed.  Constitutional:      General: She is not in acute distress.    Appearance: Normal appearance. She is well-developed. She is obese. She is not ill-appearing, toxic-appearing or diaphoretic.     Comments: + chronically ill appearing with pallor   HENT:     Head: Normocephalic and atraumatic.  Eyes:     Pupils: Pupils are equal, round, and reactive to light.  Neck:     Thyroid : No thyromegaly.     Trachea: No tracheal deviation.  Cardiovascular:     Rate and Rhythm: Normal rate and regular rhythm.     Heart sounds: Normal heart sounds. No murmur heard.    No friction rub. No gallop.  Musculoskeletal:     Cervical back: Neck supple.  Pulmonary:     Effort: Pulmonary effort is normal. No respiratory distress.     Breath sounds: Normal breath sounds. No wheezing.  Skin:    General: Skin is warm and dry.     Coloration: Skin is not pale.     Findings: No erythema or rash.     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  Wound bed healing well, slightly pink..  Poor circulation noted in the area.  No tracking, erythema, induration, fluctuance or swelling   Neurological:     Mental Status: She is alert and oriented to person, place, and time.     Gait: Gait normal.     Deep Tendon Reflexes: Reflexes are normal and symmetric.  Psychiatric:        Mood and Affect: Mood normal.        Behavior: Behavior normal.        Thought Content: Thought content normal.        Judgment: Judgment normal.      Voice recognition software was used and creation of this note. Despite my best effort at editing the text, some misspelling/errors may have occurred. *Some images could not  be shown.

## 2024-04-25 ENCOUNTER — Other Ambulatory Visit: Payer: Self-pay | Admitting: Orthopaedic Surgery

## 2024-04-28 NOTE — Patient Instructions (Addendum)
 SURGICAL WAITING ROOM VISITATION Patients having surgery or a procedure may have no more than 2 support people in the waiting area - these visitors may rotate in the visitor waiting room.   If the patient needs to stay at the hospital during part of their recovery, the visitor guidelines for inpatient rooms apply.  PRE-OP  VISITATION  Pre-op  nurse will coordinate an appropriate time for 1 support person to accompany the patient in pre-op .  This support person may not rotate.  This visitor will be contacted when the time is appropriate for the visitor to come back in the pre-op  area.  Please refer to the [facility] website for the visitor guidelines for Inpatients (after your surgery is over and you are in a regular room).  You are not required to quarantine at this time prior to your surgery. However, you must do this: Hand Hygiene often Do NOT share personal items Notify your provider if you are in close contact with someone who has COVID or you develop fever 100.4 or greater, new onset of sneezing, cough, sore throat, shortness of breath or body aches.  If you test positive for Covid or have been in contact with anyone that has tested positive in the last 10 days please notify you surgeon.    Your procedure is scheduled on:  TUESDAY  May 03, 2024  Report to [facility] Main Entrance: [address] entrance where the [employer] is available.   Report to admitting at:  07:00   AM  Call this number if you have any questions or problems the morning of surgery [phone]  FOLLOW ANY ADDITIONAL PRE OP INSTRUCTIONS YOU RECEIVED FROM YOUR SURGEON'S OFFICE!!!  Do not eat food after Midnight the night prior to your surgery/procedure.  How to Manage Your Diabetes Before and After Surgery  Why is it important to control my blood sugar before and after surgery? Improving blood sugar levels before and after surgery helps healing and can limit problems. A way of improving blood sugar  control is eating a healthy diet by:  Eating less sugar and carbohydrates  Increasing activity/exercise  Talking with your doctor about reaching your blood sugar goals High blood sugars (greater than 180 mg/dL) can raise your risk of infections and slow your recovery, so you will need to focus on controlling your diabetes during the weeks before surgery. Make sure that the doctor who takes care of your diabetes knows about your planned surgery including the date and location.  How do I manage my blood sugar before surgery? Check your blood sugar at least 4 times a day, starting 2 days before surgery, to make sure that the level is not too high or low. Check your blood sugar the morning of your surgery when you wake up and every 2 hours until you get to the Short Stay unit. If your blood sugar is less than 70 mg/dL, you will need to treat for low blood sugar: Do not take insulin . Treat a low blood sugar (less than 70 mg/dL) with  cup of clear juice (cranberry or apple), 4 glucose tablets, OR glucose gel. Recheck blood sugar in 15 minutes after treatment (to make sure it is greater than 70 mg/dL). If your blood sugar is not greater than 70 mg/dL on recheck, call [phone] for further instructions. Report your blood sugar to the short stay nurse when you get to Short Stay.  If you are admitted to the hospital after surgery: Your blood sugar will be checked by  the staff and you will probably be given insulin  after surgery (instead of oral diabetes medicines) to make sure you have good blood sugar levels. The goal for blood sugar control after surgery is 80-180 mg/dL.   WHAT DO I DO ABOUT MY DIABETES MEDICATION?  IF you have any questions, call the nurse at [phone]  TRESIBA - Insulin  degludec-  50 units q day       Day BEFORE surgery: take 100% (50 units)             DAY OF SURGERY:  take 50% of dose (25 units)  NOVOLOG  Flexpen-  SS 8-12 units ac         Day BEFORE surgery:  Take as  usual with meals.          DAY OF SURGERY:  If your CBG is greater than 220 mg/dL, you may take  of your sliding scale  (correction) dose of insulin .    After Midnight you may have the following liquids until    ???? 0615     AM DAY OF SURGERY  Clear Liquid Diet Water  Black Coffee (sugar ok, NO MILK/CREAM OR CREAMERS)  Tea (sugar ok, NO MILK/CREAM OR CREAMERS) regular and decaf                             Plain Jell-O  with no fruit (NO RED)                                           Fruit ices (not with fruit pulp, NO RED)                                     Popsicles (NO RED)                                                                  Juice: NO CITRUS JUICES: only apple, WHITE grape, WHITE cranberry Sports drinks like Gatorade or Powerade (NO RED)                Oral Hygiene is also important to reduce your risk of infection.        Remember - BRUSH YOUR TEETH THE MORNING OF SURGERY WITH YOUR REGULAR TOOTHPASTE  Do NOT smoke after Midnight the night before surgery.  ELIQUIS - Hold x 72 hours ?   STOP TAKING all Vitamins, Herbs and supplements 1 week before your surgery.   Take ONLY these medicines the morning of surgery with A SIP OF WATER : famotidine , omeprazole , cetirizine, hydralazine  ???? You may use your inhaler if needed.    You may not have any metal on your body including hair pins, jewelry, and body piercing  Do not wear make-up, lotions, powders, perfumes or deodorant  Do not wear nail polish including gel and S&S, artificial / acrylic nails, or any other type of covering on natural nails including finger and toenails. If you have artificial nails, gel coating, etc., that needs to be removed by a nail salon, Please have this removed prior  to surgery. Not doing so may mean that your surgery could be cancelled or delayed if the Surgeon or anesthesia staff feels like they are unable to monitor you safely.   Do not shave 48 hours prior to surgery to avoid nicks in your  skin which may contribute to postoperative infections.    Contacts, Hearing Aids, dentures or bridgework may not be worn into surgery. DENTURES WILL BE REMOVED PRIOR TO SURGERY PLEASE DO NOT APPLY Poly grip OR ADHESIVES!!!  You may bring a small overnight bag with you on the day of surgery, only pack items that are not valuable. [place] IS NOT RESPONSIBLE   FOR VALUABLES THAT ARE LOST OR STOLEN.   Patients discharged on the day of surgery will not be allowed to drive home.  Someone NEEDS to stay with you for the first 24 hours after anesthesia.  Do not bring your home medications to the hospital. The Pharmacy will dispense medications listed on your medication list to you during your admission in the Hospital.  Special Instructions: Bring a copy of your healthcare power of attorney and living will documents the day of surgery, if you wish to have them scanned into your [place] Medical Records- EPIC  Please read over the following fact sheets you were given: IF YOU HAVE QUESTIONS ABOUT YOUR PRE-OP  INSTRUCTIONS, PLEASE CALL [phone]   [facility] - Preparing for Surgery Before surgery, you can play an important role.  Because skin is not sterile, your skin needs to be as free of germs as possible.  You can reduce the number of germs on your skin by washing with CHG (chlorahexidine gluconate) soap before surgery.  CHG is an antiseptic cleaner which kills germs and bonds with the skin to continue killing germs even after washing. Please DO NOT use if you have an allergy to CHG or antibacterial soaps.  If your skin becomes reddened/irritated stop using the CHG and inform your nurse when you arrive at Short Stay. Do not shave (including legs and underarms) for at least 48 hours prior to the first CHG shower.  You may shave your face/neck.  Please follow these instructions carefully:  1.  Shower with CHG Soap the night before surgery and the  morning of surgery.  2.  If you choose to  wash your hair, wash your hair first as usual with your normal  shampoo.  3.  After you shampoo, rinse your hair and body thoroughly to remove the shampoo.                             4.  Use CHG as you would any other liquid soap.  You can apply chg directly to the skin and wash.  Gently with a scrungie or clean washcloth.  5.  Apply the CHG Soap to your body ONLY FROM THE NECK DOWN.   Do not use on face/ open                           Wound or open sores. Avoid contact with eyes, ears mouth and genitals (private parts).                       Wash face,  Genitals (private parts) with your normal soap.             6.  Wash thoroughly, paying special attention to the area  where your  surgery  will be performed.  7.  Thoroughly rinse your body with warm water  from the neck down.  8.  DO NOT shower/wash with your normal soap after using and rinsing off the CHG Soap.            9.  Pat yourself dry with a clean towel.            10.  Wear clean pajamas.            11.  Place clean sheets on your bed the night of your first shower and do not  sleep with pets.  ON THE DAY OF SURGERY : Do not apply any lotions/deodorants the morning of surgery.  Please wear clean clothes to the hospital/surgery center.     FAILURE TO FOLLOW THESE INSTRUCTIONS MAY RESULT IN THE CANCELLATION OF YOUR SURGERY  PATIENT SIGNATURE_________________________________  NURSE SIGNATURE__________________________________  ________________________________________________________________________

## 2024-04-28 NOTE — Progress Notes (Signed)
 COVID Vaccine received:  []  No [x]  Yes Date of any COVID positive Test in last 90 days:  PCP - Lyle Setters, NP  Cardiologist -  Dialysis Center- [address], [place], [place]) - Dr. Dalene [phone]  Endocrinology-  Marcelline Nigh, NP at [facility]  Chest x-ray -  EKG - 12-30-2023  Epic Stress Test - 04-26-2019  Myoview  Epic ECHO - 12-16-2023  CEW Cardiac Cath -  CT Coronary Calcium  score:   Pacemaker / ICD device [x]  No []  Yes   Spinal Cord Stimulator:[x]  No []  Yes       History of Sleep Apnea? [x]  No []  Yes   CPAP used?- [x]  No []  Yes    Does the patient monitor blood sugar?   []  N/A   []  No []  Yes  Patient has: []  NO Hx DM   []  Pre-DM   []  DM1  [x]   DM2 Last A1c was:  12.5  on  03-16-24 per endo MD    Does patient have a [employer] or Dexcom? []  No [x]  Yes   Fasting Blood Sugar Ranges-  Checks Blood Sugar  continuous times a day  TRESIBA - Insulin  degludec-  50 units q day NOVOLOG  FLEXPEN-  SS 8-12 units ac  Blood Thinner / Instructions:Eliquis   hold x 48 hours Aspirin  Instructions:  ASA 81 mg ???  ERAS Protocol Ordered: []  No  []  Yes PRE-SURGERY []  ENSURE  []  G2   []  No Drink Ordered  Patient is to be NPO after:   NO ORDERS  Dental hx: []  Dentures:  []  N/A      []  Bridge or Partial:                   []  Loose or Damaged teeth:   Comments:   Activity level: Able to walk up 2 flights of stairs without becoming significantly short of breath or having chest pain?  []  No   []    Yes  Patient can perform ADLs without assistance. 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 Hx ischemic CVA in 2022, GERD  Patient denies any S&S of respiratory illness or Covid - no shortness of breath, fever, cough or chest pain at PAT appointment.  Patient verbalized understanding and agreement to the Pre-Surgical Instructions that  were given to them at this PAT appointment. Patient was also educated of the need to review these PAT instructions again prior to her surgery.I reviewed the appropriate phone numbers to call if they have any and questions or concerns.

## 2024-04-29 ENCOUNTER — Encounter (HOSPITAL_COMMUNITY)
Admission: RE | Admit: 2024-04-29 | Discharge: 2024-04-29 | Disposition: A | Discharge status: Home / Self Care | Source: Ambulatory Visit | Attending: Orthopaedic Surgery | Admitting: Orthopaedic Surgery

## 2024-04-29 DIAGNOSIS — Z01818 Encounter for other preprocedural examination: Secondary | ICD-10-CM

## 2024-04-29 DIAGNOSIS — E11628 Type 2 diabetes mellitus with other skin complications: Secondary | ICD-10-CM

## 2024-04-29 DIAGNOSIS — N186 End stage renal disease: Secondary | ICD-10-CM

## 2024-04-29 DIAGNOSIS — Z794 Long term (current) use of insulin: Secondary | ICD-10-CM

## 2024-04-29 LAB — GLUCOSE, CAPILLARY: Glucose-Capillary: >600 mg/dL (ref 70–99)

## 2024-05-02 NOTE — Progress Notes (Signed)
 Anesthesia Chart Review   Case: [id number] Date/Time: 05/03/24 0900   Procedures:      IRRIGATION AND DEBRIDEMENT WOUND (Right)     CREATION, FLAP, ROTATION (Right)   Anesthesia type: Choice   Diagnosis:      Chronic diabetic ulcer of right foot determined by examination (HCC) [Z88.378, L97.519]     Abscess of right foot [L02.611]   Pre-op  diagnosis: RIGHT FOOT CHRONIC DIABETIC ULCERATION, RIGHT DEEP FOOT ABSCESS   Location: [facility] ROOM 03 / [facility]   Surgeons: Elsa Lonni SAUNDERS, MD       DISCUSSION:43 y.o. never smoker with h/o HTN, CHF, PE, ESRD on dialysis (T, Th, Sat), s/p right nephrectomy, DM II, right foot with chronic diabetic ulcer scheduled for above procedure 05/03/24.   Pt with poorly controlled diabetes. Follows with endocrinology.  Most recent A1C 12.5. CBG at PAT visit 600, this is non-fasting, pt also had not taken her medications.  Recommend ED visit for further workup.  Reports h/o diabetic ketoacidosis in the past.  Pt declines ED visit.  Discussed risk of cancellation DOS. Pt has not been checking glucose regularly at home.   Spoke to pt on 05/02/2024. She reports she took her insulin  when she went home and blood sugar went down.  She reports she has been following closely since Friday.  Blood sugars between 110-200 at home.  She has been taking medications as prescribed.  Discussed with Dr. Erma, evaluate DOS.   Pt on dialysis T/Th/Sat.  Last dialysis 04/30/2024.  Discussed with Dr. Treen. Ok to proceed, will check labs DOS.   Echo 12/16/23 (Care Everywhere) Left Ventricle: Systolic function is normal. EF: 65-70%. Quantitative  analysis of left ventricular Global Longitudinal Strain (GLS) imaging is  -13.5%, which is abnormal. Strain imaging pattern suggests cardiac  amyloidosis.   Left Ventricle: There is at least moderate concentric hypertrophy  (walls were not visualized).    Mitral Valve: There is mild stenosis, with a mean gradient of 3.000  mmHg. P1/2T = 80.000 ms.  MVA = 2.750 cm2.    Mitral Valve: The leaflets are moderately thickened and exhibit mildly  reduced excursion.    Pericardium: There is a small pericardial effusion noted.  VS: There were no vitals taken for this visit.  PROVIDERS: Arby Lyle LABOR, NP   LABS: Labs reviewed: Acceptable for surgery. (all labs ordered are listed, but only abnormal results are displayed)  Labs Reviewed  GLUCOSE, CAPILLARY - Abnormal; Notable for the following components:      Result Value   Glucose-Capillary >600 (*)    All other components within normal limits     IMAGES:   EKG:   CV:  Past Medical History:  Diagnosis Date   Anemia    Asthma    as a child   CHF (congestive heart failure) (HCC)    Pt reports that father has CHF, not her   Chronic kidney disease    Diabetes mellitus without complication (HCC)    Dyslipidemia    GERD (gastroesophageal reflux disease)    Hypertension    Pulmonary embolism (HCC)    Stroke [facility])     Past Surgical History:  Procedure Laterality Date   A/V SHUNT INTERVENTION Right 03/28/2024   Procedure: A/V SHUNT INTERVENTION;  Surgeon: Magda Debby SAILOR, MD;  Location: [facility] LAB;  Service: Cardiovascular;  Laterality: Right;   APPLICATION OF WOUND VAC Right 04/12/2021   Procedure: APPLICATION OF WOUND VAC;  Surgeon: Elsa Lonni SAUNDERS, MD;  Location:  [facility] OR;  Service: Orthopedics;  Laterality: Right;   AV FISTULA PLACEMENT Right 01/20/2024   Procedure: RIGHT ARM ARTERIOVENOUS (AV) BRACHIOCEPHALIC FISTULA CREATION;  Surgeon: Pearline Norman RAMAN, MD;  Location: [facility] OR;  Service: Vascular;  Laterality: Right;   DIALYSIS/PERMA CATHETER INSERTION N/A 10/02/2023   Procedure: DIALYSIS/PERMA CATHETER INSERTION;  Surgeon: Pearline Norman RAMAN, MD;  Location: [facility] INVASIVE CV LAB;  Service: Cardiovascular;  Laterality: N/A;   DIALYSIS/PERMA CATHETER INSERTION N/A 02/29/2024   Procedure: DIALYSIS/PERMA CATHETER INSERTION;  Surgeon: Pearline Norman RAMAN, MD;  Location: [facility] INVASIVE  CV LAB;  Service: Cardiovascular;  Laterality: N/A;   I & D EXTREMITY Right 04/06/2021   Procedure: IRRIGATION AND DEBRIDEMENT TOE WITH WOUND VAC PLACEMENT;  Surgeon: Elsa Lonni SAUNDERS, MD;  Location: [facility] OR;  Service: Orthopedics;  Laterality: Right;   I & D EXTREMITY Right 04/12/2021   Procedure: IRRIGATION AND DEBRIDEMENT RIGHT FOOT;  Surgeon: Elsa Lonni SAUNDERS, MD;  Location: [facility] OR;  Service: Orthopedics;  Laterality: Right;   IR FLUORO GUIDE CV LINE RIGHT  10/01/2023   IR FLUORO GUIDE CV LINE RIGHT  10/01/2023   IR FLUORO GUIDE CV LINE RIGHT  10/02/2023   IR US  GUIDE VASC ACCESS RIGHT  10/01/2023   MOUTH SURGERY     ROBOTIC ASSITED PARTIAL NEPHRECTOMY Right 07/28/2019   Procedure: XI ROBOTIC ASSITED PARTIAL NEPHRECTOMY;  Surgeon: Sherrilee Belvie CROME, MD;  Location: [facility];  Service: Urology;  Laterality: Right;  3 HRS   SECONDARY CLOSURE OF WOUND Right 06/18/2021   Procedure: SECONDARY CLOSURE OF WOUND tRANSMETATARSAL AMPUTAION;  Surgeon: Elsa Lonni SAUNDERS, MD;  Location: [facility] OR;  Service: Orthopedics;  Laterality: Right;   TEMPORARY DIALYSIS CATHETER N/A 02/29/2024   Procedure: TEMPORARY DIALYSIS CATHETER;  Surgeon: Pearline Norman RAMAN, MD;  Location: [facility] INVASIVE CV LAB;  Service: Cardiovascular;  Laterality: N/A;   TRANSMETATARSAL AMPUTATION Right 04/12/2021   Procedure: TRANSMETATARSAL AMPUTATION;  Surgeon: Elsa Lonni SAUNDERS, MD;  Location: [facility] OR;  Service: Orthopedics;  Laterality: Right;   TUNNELLED CATHETER EXCHANGE N/A 01/05/2024   Procedure: TUNNELLED CATHETER EXCHANGE;  Surgeon: Tobie Gordy POUR, MD;  Location: [facility] INVASIVE CV LAB;  Service: Cardiovascular;  Laterality: N/A;   VENOUS ANGIOPLASTY  03/28/2024   Procedure: VENOUS ANGIOPLASTY;  Surgeon: Magda Debby SAILOR, MD;  Location: [facility];  Service: Cardiovascular;;  Cephalic Vein    MEDICATIONS:  apixaban  (ELIQUIS ) 5 MG TABS tablet   ASHWAGANDHA PO   Blood Glucose Monitoring Suppl (BLOOD GLUCOSE MONITOR SYSTEM) w/Device KIT    cetirizine (ZYRTEC) 10 MG tablet   Continuous Blood Gluc Sensor (DEXCOM G6 SENSOR) MISC   diphenhydrAMINE  (BENADRYL ) 25 mg capsule   famotidine  (PEPCID ) 20 MG tablet   gabapentin  (NEURONTIN ) 100 MG capsule   hydrALAZINE  (APRESOLINE ) 100 MG tablet   hydrALAZINE  (APRESOLINE ) 50 MG tablet   insulin  aspart (NOVOLOG ) 100 UNIT/ML FlexPen   Insulin  Glargine (BASAGLAR  KWIKPEN) 100 UNIT/ML   Insulin  Pen Needle 32G X 4 MM MISC   losartan (COZAAR) 25 MG tablet   nitroGLYCERIN  (NITROSTAT ) 0.4 MG SL tablet   omeprazole  (PRILOSEC) 20 MG capsule   rosuvastatin  (CRESTOR ) 10 MG tablet   sevelamer carbonate (RENVELA) 800 MG tablet   VITAMIN D , CHOLECALCIFEROL, PO   No current facility-administered medications for this encounter.     Harlene Hoots Ward, PA-C [facility] [phone]

## 2024-05-02 NOTE — Anesthesia Preprocedure Evaluation (Signed)
 Anesthesia Evaluation  Patient identified by MRN, date of birth, ID band Patient awake    Reviewed: Allergy & Precautions, H&P , NPO status , Patient's Chart, lab work & pertinent test results  Airway Mallampati: II  TM Distance: >3 FB Neck ROM: Full    Dental no notable dental hx.    Pulmonary asthma , PE   Pulmonary exam normal breath sounds clear to auscultation       Cardiovascular hypertension, Pt. on medications +CHF  Normal cardiovascular exam Rhythm:Regular Rate:Normal  Pericardial effusion  Normal biventricular function  Moderate MS mean PG 5   Neuro/Psych CVA  negative psych ROS   GI/Hepatic Neg liver ROS,GERD  ,,  Endo/Other  diabetes, Type 1  Class 3 obesity  Renal/GU ESRFRenal diseaseRCC s/p nephrectomy  negative genitourinary   Musculoskeletal negative musculoskeletal ROS (+)    Abdominal   Peds negative pediatric ROS (+)  Hematology  (+) Blood dyscrasia, anemia   Anesthesia Other Findings   Reproductive/Obstetrics negative OB ROS                              Anesthesia Physical Anesthesia Plan  ASA: 4  Anesthesia Plan: General   Post-op Pain Management: Precedex  and Ketamine IV*   Induction: Intravenous  PONV Risk Score and Plan: 2 and Ondansetron , Dexamethasone  and Treatment may vary due to age or medical condition  Airway Management Planned: Oral ETT and LMA  Additional Equipment: None  Intra-op Plan:   Post-operative Plan: Extubation in OR  Informed Consent:      Dental advisory given  Plan Discussed with: CRNA and Anesthesiologist  Anesthesia Plan Comments: (See PAT note 04/29/24   DISCUSSION:43 y.o. never smoker with h/o HTN, CHF, PE, ESRD on dialysis (T, Th, Sat), s/p right nephrectomy, DM II, right foot with chronic diabetic ulcer scheduled for above procedure 05/03/24.    Pt with poorly controlled diabetes. Follows with endocrinology.  Most  recent A1C 12.5. CBG at PAT visit 600, this is non-fasting, pt also had not taken her medications.  Recommend ED visit for further workup.  Reports h/o diabetic ketoacidosis in the past.  Pt declines ED visit.  Discussed risk of cancellation DOS. Pt has not been checking glucose regularly at home.    Spoke to pt on 05/02/2024. She reports she took her insulin  when she went home and blood sugar went down.  She reports she has been following closely since Friday.  Blood sugars between 110-200 at home.  She has been taking medications as prescribed.  Discussed with Dr. Erma, evaluate DOS.    Pt on dialysis T/Th/Sat.  Last dialysis 04/30/2024.  Discussed with Dr. Treen. Ok to proceed, will check labs DOS.    Echo 12/16/23 (Care Everywhere) Left Ventricle: Systolic function is normal. EF: 65-70%. Quantitative  analysis of left ventricular Global Longitudinal Strain (GLS) imaging is  -13.5%, which is abnormal. Strain imaging pattern suggests cardiac  amyloidosis.   Left Ventricle: There is at least moderate concentric hypertrophy  (walls were not visualized).    Mitral Valve: There is mild stenosis, with a mean gradient of 3.000  mmHg. P1/2T = 80.000 ms. MVA = 2.750 cm2.    Mitral Valve: The leaflets are moderately thickened and exhibit mildly  reduced excursion.    Pericardium: There is a small pericardial effusion noted.  VS: There were no vitals taken for this visit.   )        Anesthesia  Quick Evaluation

## 2024-05-03 ENCOUNTER — Encounter (HOSPITAL_COMMUNITY): Payer: Self-pay | Admitting: Physician Assistant

## 2024-05-03 ENCOUNTER — Other Ambulatory Visit: Payer: Self-pay

## 2024-05-03 ENCOUNTER — Ambulatory Visit (HOSPITAL_COMMUNITY)
Admission: RE | Admit: 2024-05-03 | Discharge: 2024-05-03 | Disposition: A | Discharge status: Home / Self Care | Attending: Orthopaedic Surgery | Admitting: Orthopaedic Surgery

## 2024-05-03 ENCOUNTER — Encounter (HOSPITAL_COMMUNITY): Payer: Self-pay | Admitting: Orthopaedic Surgery

## 2024-05-03 ENCOUNTER — Encounter (HOSPITAL_COMMUNITY)
Admission: RE | Disposition: A | Discharge status: Home / Self Care | Payer: Self-pay | Source: Home / Self Care | Attending: Orthopaedic Surgery

## 2024-05-03 ENCOUNTER — Ambulatory Visit (HOSPITAL_BASED_OUTPATIENT_CLINIC_OR_DEPARTMENT_OTHER): Payer: Self-pay | Admitting: Anesthesiology

## 2024-05-03 DIAGNOSIS — E10621 Type 1 diabetes mellitus with foot ulcer: Secondary | ICD-10-CM | POA: Diagnosis not present

## 2024-05-03 DIAGNOSIS — E11621 Type 2 diabetes mellitus with foot ulcer: Secondary | ICD-10-CM

## 2024-05-03 DIAGNOSIS — E785 Hyperlipidemia, unspecified: Secondary | ICD-10-CM | POA: Insufficient documentation

## 2024-05-03 DIAGNOSIS — Z794 Long term (current) use of insulin: Secondary | ICD-10-CM | POA: Diagnosis not present

## 2024-05-03 DIAGNOSIS — Z992 Dependence on renal dialysis: Secondary | ICD-10-CM | POA: Insufficient documentation

## 2024-05-03 DIAGNOSIS — L97519 Non-pressure chronic ulcer of other part of right foot with unspecified severity: Secondary | ICD-10-CM | POA: Diagnosis present

## 2024-05-03 DIAGNOSIS — I509 Heart failure, unspecified: Secondary | ICD-10-CM

## 2024-05-03 DIAGNOSIS — E66813 Obesity, class 3: Secondary | ICD-10-CM | POA: Insufficient documentation

## 2024-05-03 DIAGNOSIS — N186 End stage renal disease: Secondary | ICD-10-CM | POA: Insufficient documentation

## 2024-05-03 DIAGNOSIS — I132 Hypertensive heart and chronic kidney disease with heart failure and with stage 5 chronic kidney disease, or end stage renal disease: Secondary | ICD-10-CM | POA: Diagnosis not present

## 2024-05-03 DIAGNOSIS — Z905 Acquired absence of kidney: Secondary | ICD-10-CM | POA: Insufficient documentation

## 2024-05-03 DIAGNOSIS — L97512 Non-pressure chronic ulcer of other part of right foot with fat layer exposed: Secondary | ICD-10-CM | POA: Diagnosis not present

## 2024-05-03 DIAGNOSIS — E1022 Type 1 diabetes mellitus with diabetic chronic kidney disease: Secondary | ICD-10-CM | POA: Diagnosis not present

## 2024-05-03 DIAGNOSIS — L02611 Cutaneous abscess of right foot: Secondary | ICD-10-CM | POA: Diagnosis present

## 2024-05-03 DIAGNOSIS — K219 Gastro-esophageal reflux disease without esophagitis: Secondary | ICD-10-CM | POA: Diagnosis not present

## 2024-05-03 DIAGNOSIS — E1165 Type 2 diabetes mellitus with hyperglycemia: Secondary | ICD-10-CM

## 2024-05-03 DIAGNOSIS — Z7901 Long term (current) use of anticoagulants: Secondary | ICD-10-CM | POA: Diagnosis not present

## 2024-05-03 DIAGNOSIS — J45909 Unspecified asthma, uncomplicated: Secondary | ICD-10-CM | POA: Insufficient documentation

## 2024-05-03 DIAGNOSIS — Z01818 Encounter for other preprocedural examination: Secondary | ICD-10-CM

## 2024-05-03 DIAGNOSIS — Z86711 Personal history of pulmonary embolism: Secondary | ICD-10-CM | POA: Diagnosis not present

## 2024-05-03 DIAGNOSIS — L089 Local infection of the skin and subcutaneous tissue, unspecified: Secondary | ICD-10-CM

## 2024-05-03 HISTORY — PX: INCISION AND DRAINAGE OF WOUND: SHX1803

## 2024-05-03 LAB — POCT I-STAT, CHEM 8
BUN: 19 mg/dL (ref 6–20)
Calcium, Ion: 1.12 mmol/L — ABNORMAL LOW (ref 1.15–1.40)
Chloride: 96 mmol/L — ABNORMAL LOW (ref 98–111)
Creatinine, Ser: 4.4 mg/dL — ABNORMAL HIGH (ref 0.44–1.00)
Glucose, Bld: 79 mg/dL (ref 70–99)
HCT: 38 % (ref 36.0–46.0)
Hemoglobin: 12.9 g/dL (ref 12.0–15.0)
Potassium: 3.7 mmol/L (ref 3.5–5.1)
Sodium: 138 mmol/L (ref 135–145)
TCO2: 29 mmol/L (ref 22–32)

## 2024-05-03 LAB — GLUCOSE, CAPILLARY
Glucose-Capillary: 73 mg/dL (ref 70–99)
Glucose-Capillary: 74 mg/dL (ref 70–99)

## 2024-05-03 LAB — HCG, SERUM, QUALITATIVE: Preg, Serum: NEGATIVE

## 2024-05-03 SURGERY — IRRIGATION AND DEBRIDEMENT WOUND
Anesthesia: General | Laterality: Right

## 2024-05-03 MED ORDER — INSULIN ASPART 100 UNIT/ML IJ SOLN: 0.0000 [IU] | INTRAMUSCULAR | Status: DC | PRN

## 2024-05-03 MED ORDER — POVIDONE-IODINE 7.5 % EX SOLN: Freq: Once | CUTANEOUS | Status: DC

## 2024-05-03 MED ORDER — FENTANYL CITRATE PF 50 MCG/ML IJ SOSY
PREFILLED_SYRINGE | INTRAMUSCULAR | Status: AC
  Filled 2024-05-03: qty 2

## 2024-05-03 MED ORDER — DEXAMETHASONE SODIUM PHOSPHATE 10 MG/ML IJ SOLN
INTRAMUSCULAR | Status: AC
  Filled 2024-05-03: qty 1

## 2024-05-03 MED ORDER — DEXMEDETOMIDINE HCL IN NACL 80 MCG/20ML IV SOLN
INTRAVENOUS | Status: AC
  Filled 2024-05-03: qty 20

## 2024-05-03 MED ORDER — ORAL CARE MOUTH RINSE: 15.0000 mL | Freq: Once | OROMUCOSAL | Status: AC

## 2024-05-03 MED ORDER — SODIUM CHLORIDE 0.9 % IR SOLN
Status: DC | PRN
  Administered 2024-05-03: 1000 mL

## 2024-05-03 MED ORDER — PROPOFOL 10 MG/ML IV BOLUS
INTRAVENOUS | Status: DC | PRN
  Administered 2024-05-03: 150 mg via INTRAVENOUS

## 2024-05-03 MED ORDER — FENTANYL CITRATE PF 50 MCG/ML IJ SOSY
25.0000 ug | PREFILLED_SYRINGE | INTRAMUSCULAR | Status: DC | PRN
  Administered 2024-05-03 (×2): 25 ug via INTRAVENOUS

## 2024-05-03 MED ORDER — ONDANSETRON HCL 4 MG/2ML IJ SOLN: 4.0000 mg | Freq: Once | INTRAMUSCULAR | Status: DC | PRN

## 2024-05-03 MED ORDER — MEPERIDINE HCL 25 MG/ML IJ SOLN: 6.2500 mg | INTRAMUSCULAR | Status: DC | PRN

## 2024-05-03 MED ORDER — PROPOFOL 10 MG/ML IV BOLUS
INTRAVENOUS | Status: AC
Start: 2024-05-03 — End: 2024-05-03
  Filled 2024-05-03: qty 20

## 2024-05-03 MED ORDER — SODIUM CHLORIDE 0.9 % IV SOLN: INTRAVENOUS | Status: DC | PRN

## 2024-05-03 MED ORDER — EPHEDRINE SULFATE-NACL 50-0.9 MG/10ML-% IV SOSY
PREFILLED_SYRINGE | INTRAVENOUS | Status: DC | PRN
Start: 2024-05-03 — End: 2024-05-03
  Administered 2024-05-03: 10 mg via INTRAVENOUS

## 2024-05-03 MED ORDER — OXYCODONE HCL 5 MG PO TABS: 5.0000 mg | ORAL_TABLET | Freq: Once | ORAL | Status: DC | PRN

## 2024-05-03 MED ORDER — PHENYLEPHRINE 80 MCG/ML (10ML) SYRINGE FOR IV PUSH (FOR BLOOD PRESSURE SUPPORT)
PREFILLED_SYRINGE | INTRAVENOUS | Status: AC
  Filled 2024-05-03: qty 20

## 2024-05-03 MED ORDER — MIDAZOLAM HCL 5 MG/5ML IJ SOLN
INTRAMUSCULAR | Status: DC | PRN
  Administered 2024-05-03: 2 mg via INTRAVENOUS

## 2024-05-03 MED ORDER — EPHEDRINE 5 MG/ML INJ
INTRAVENOUS | Status: AC
  Filled 2024-05-03: qty 5

## 2024-05-03 MED ORDER — OXYCODONE HCL 5 MG/5ML PO SOLN: 5.0000 mg | Freq: Once | ORAL | Status: DC | PRN

## 2024-05-03 MED ORDER — DEXMEDETOMIDINE HCL IN NACL 80 MCG/20ML IV SOLN
INTRAVENOUS | Status: DC | PRN
  Administered 2024-05-03: 8 ug via INTRAVENOUS

## 2024-05-03 MED ORDER — MIDAZOLAM HCL 2 MG/2ML IJ SOLN
INTRAMUSCULAR | Status: AC
  Filled 2024-05-03: qty 2

## 2024-05-03 MED ORDER — LIDOCAINE HCL (PF) 2 % IJ SOLN
INTRAMUSCULAR | Status: AC
  Filled 2024-05-03: qty 5

## 2024-05-03 MED ORDER — FENTANYL CITRATE (PF) 100 MCG/2ML IJ SOLN
INTRAMUSCULAR | Status: DC | PRN
  Administered 2024-05-03: 50 ug via INTRAVENOUS

## 2024-05-03 MED ORDER — DEXAMETHASONE SODIUM PHOSPHATE 10 MG/ML IJ SOLN
INTRAMUSCULAR | Status: DC | PRN
  Administered 2024-05-03: 4 mg via INTRAVENOUS

## 2024-05-03 MED ORDER — LACTATED RINGERS IV SOLN: INTRAVENOUS | Status: DC

## 2024-05-03 MED ORDER — LIDOCAINE HCL (PF) 2 % IJ SOLN
INTRAMUSCULAR | Status: DC | PRN
Start: 2024-05-03 — End: 2024-05-03
  Administered 2024-05-03: 100 mg via INTRADERMAL

## 2024-05-03 MED ORDER — ONDANSETRON HCL 4 MG/2ML IJ SOLN
INTRAMUSCULAR | Status: AC
  Filled 2024-05-03: qty 2

## 2024-05-03 MED ORDER — FENTANYL CITRATE (PF) 100 MCG/2ML IJ SOLN
INTRAMUSCULAR | Status: AC
  Filled 2024-05-03: qty 2

## 2024-05-03 MED ORDER — CEFAZOLIN SODIUM-DEXTROSE 2-4 GM/100ML-% IV SOLN
2.0000 g | INTRAVENOUS | Status: AC
  Administered 2024-05-03: 2 g via INTRAVENOUS
  Filled 2024-05-03: qty 100

## 2024-05-03 MED ORDER — 0.9 % SODIUM CHLORIDE (POUR BTL) OPTIME
TOPICAL | Status: DC | PRN
  Administered 2024-05-03: 1000 mL

## 2024-05-03 MED ORDER — CHLORHEXIDINE GLUCONATE 0.12 % MT SOLN
15.0000 mL | Freq: Once | OROMUCOSAL | Status: AC
  Administered 2024-05-03: 15 mL via OROMUCOSAL

## 2024-05-03 MED ORDER — PHENYLEPHRINE 80 MCG/ML (10ML) SYRINGE FOR IV PUSH (FOR BLOOD PRESSURE SUPPORT)
PREFILLED_SYRINGE | INTRAVENOUS | Status: DC | PRN
  Administered 2024-05-03 (×2): 80 ug via INTRAVENOUS

## 2024-05-03 SURGICAL SUPPLY — 32 items
BNDG ELASTIC 4INX 5YD STR LF (GAUZE/BANDAGES/DRESSINGS) IMPLANT
BNDG ELASTIC 6X10 VLCR STRL LF (GAUZE/BANDAGES/DRESSINGS) IMPLANT
CANISTER WOUND CARE 500ML ATS (WOUND CARE) IMPLANT
CUFF TOURN SGL QUICK 42 (TOURNIQUET CUFF) ×1 IMPLANT
DRAIN CHANNEL 19F RND (DRAIN) IMPLANT
DRAPE U-SHAPE 47X51 STRL (DRAPES) ×1 IMPLANT
DRSG ADAPTIC 3X8 NADH LF (GAUZE/BANDAGES/DRESSINGS) IMPLANT
DRSG VAC GRANUFOAM LG (GAUZE/BANDAGES/DRESSINGS) IMPLANT
DRSG VAC GRANUFOAM MED (GAUZE/BANDAGES/DRESSINGS) IMPLANT
DRSG VAC GRANUFOAM SM (GAUZE/BANDAGES/DRESSINGS) IMPLANT
DURAPREP 26ML APPLICATOR (WOUND CARE) ×1 IMPLANT
ELECT PENCIL ROCKER SW 15FT (MISCELLANEOUS) ×1 IMPLANT
GAUZE PAD ABD 8X10 STRL (GAUZE/BANDAGES/DRESSINGS) IMPLANT
GAUZE SPONGE 4X4 12PLY STRL (GAUZE/BANDAGES/DRESSINGS) IMPLANT
GAUZE XEROFORM 1X8 LF (GAUZE/BANDAGES/DRESSINGS) IMPLANT
GLOVE BIO SURGEON STRL SZ7.5 (GLOVE) ×1 IMPLANT
GLOVE BIOGEL PI IND STRL 8 (GLOVE) ×1 IMPLANT
KIT TURNOVER KIT A (KITS) ×1 IMPLANT
MANIFOLD NEPTUNE II (INSTRUMENTS) IMPLANT
PACK ORTHO EXTREMITY (CUSTOM PROCEDURE TRAY) ×1 IMPLANT
PAD CAST 4YDX4 CTTN HI CHSV (CAST SUPPLIES) IMPLANT
PADDING CAST COTTON 6X4 STRL (CAST SUPPLIES) IMPLANT
SET CYSTO W/LG BORE CLAMP LF (SET/KITS/TRAYS/PACK) IMPLANT
SET IRRIG Y TYPE TUR BLADDER L (SET/KITS/TRAYS/PACK) IMPLANT
STOCKINETTE 8 INCH (MISCELLANEOUS) ×1 IMPLANT
SUT ETHILON 2 0 PS N (SUTURE) IMPLANT
SUT MNCRL AB 3-0 PS2 18 (SUTURE) IMPLANT
SUT PDS AB 2-0 CT2 27 (SUTURE) ×1 IMPLANT
SWAB COLLECTION DEVICE MRSA (MISCELLANEOUS) IMPLANT
SWAB CULTURE ESWAB REG 1ML (MISCELLANEOUS) IMPLANT
TOWEL OR 17X26 10 PK STRL BLUE (TOWEL DISPOSABLE) ×1 IMPLANT
UNDERPAD 30X36 HEAVY ABSORB (UNDERPADS AND DIAPERS) ×1 IMPLANT

## 2024-05-03 NOTE — H&P (Signed)
 PREOPERATIVE H&P  Chief Complaint: Right foot wound  HPI: Sherri Dixon is a 43 y.o. female who presents for preoperative history and physical with a diagnosis of diabetes status post transmetatarsal amputation with chronic blistering and soft tissue wound.  She has undergone superficial I&D in the office but has had recurrence of her wound and concern for devitalized tissue within the wound bed and deep to the wound bed.  She is here today for surgery. Symptoms are rated as moderate to severe, and have been worsening.  This is significantly impairing activities of daily living.  She has elected for surgical management.   Past Medical History:  Diagnosis Date   Anemia    Asthma    as a child   CHF (congestive heart failure) (HCC)    Pt reports that father has CHF, not her   Chronic kidney disease    Diabetes mellitus without complication (HCC)    Dyslipidemia    GERD (gastroesophageal reflux disease)    Hypertension    Pulmonary embolism (HCC)    Stroke [facility])    Past Surgical History:  Procedure Laterality Date   A/V SHUNT INTERVENTION Right 03/28/2024   Procedure: A/V SHUNT INTERVENTION;  Surgeon: Magda Debby SAILOR, MD;  Location: [facility];  Service: Cardiovascular;  Laterality: Right;   APPLICATION OF WOUND VAC Right 04/12/2021   Procedure: APPLICATION OF WOUND VAC;  Surgeon: Elsa Lonni SAUNDERS, MD;  Location: [facility] OR;  Service: Orthopedics;  Laterality: Right;   AV FISTULA PLACEMENT Right 01/20/2024   Procedure: RIGHT ARM ARTERIOVENOUS (AV) BRACHIOCEPHALIC FISTULA CREATION;  Surgeon: Pearline Norman RAMAN, MD;  Location: [facility] OR;  Service: Vascular;  Laterality: Right;   DIALYSIS/PERMA CATHETER INSERTION N/A 10/02/2023   Procedure: DIALYSIS/PERMA CATHETER INSERTION;  Surgeon: Pearline Norman RAMAN, MD;  Location: [facility] INVASIVE CV LAB;  Service: Cardiovascular;  Laterality: N/A;   DIALYSIS/PERMA CATHETER INSERTION N/A 02/29/2024   Procedure: DIALYSIS/PERMA CATHETER INSERTION;  Surgeon: Pearline Norman RAMAN, MD;  Location: [facility] INVASIVE CV LAB;  Service: Cardiovascular;  Laterality: N/A;   I & D EXTREMITY Right 04/06/2021   Procedure: IRRIGATION AND DEBRIDEMENT TOE WITH WOUND VAC PLACEMENT;  Surgeon: Elsa Lonni SAUNDERS, MD;  Location: [facility] OR;  Service: Orthopedics;  Laterality: Right;   I & D EXTREMITY Right 04/12/2021   Procedure: IRRIGATION AND DEBRIDEMENT RIGHT FOOT;  Surgeon: Elsa Lonni SAUNDERS, MD;  Location: [facility] OR;  Service: Orthopedics;  Laterality: Right;   IR FLUORO GUIDE CV LINE RIGHT  10/01/2023   IR FLUORO GUIDE CV LINE RIGHT  10/01/2023   IR FLUORO GUIDE CV LINE RIGHT  10/02/2023   IR US  GUIDE VASC ACCESS RIGHT  10/01/2023   MOUTH SURGERY     ROBOTIC ASSITED PARTIAL NEPHRECTOMY Right 07/28/2019   Procedure: XI ROBOTIC ASSITED PARTIAL NEPHRECTOMY;  Surgeon: Sherrilee Belvie CROME, MD;  Location: [facility] ORS;  Service: Urology;  Laterality: Right;  3 HRS   SECONDARY CLOSURE OF WOUND Right 06/18/2021   Procedure: SECONDARY CLOSURE OF WOUND tRANSMETATARSAL AMPUTAION;  Surgeon: Elsa Lonni SAUNDERS, MD;  Location: [facility] OR;  Service: Orthopedics;  Laterality: Right;   TEMPORARY DIALYSIS CATHETER N/A 02/29/2024   Procedure: TEMPORARY DIALYSIS CATHETER;  Surgeon: Pearline Norman RAMAN, MD;  Location: [facility] INVASIVE CV LAB;  Service: Cardiovascular;  Laterality: N/A;   TRANSMETATARSAL AMPUTATION Right 04/12/2021   Procedure: TRANSMETATARSAL AMPUTATION;  Surgeon: Elsa Lonni SAUNDERS, MD;  Location: [facility] OR;  Service: Orthopedics;  Laterality: Right;   TUNNELLED CATHETER EXCHANGE N/A 01/05/2024   Procedure: TUNNELLED  CATHETER EXCHANGE;  Surgeon: Tobie Gordy POUR, MD;  Location: [facility] INVASIVE CV LAB;  Service: Cardiovascular;  Laterality: N/A;   VENOUS ANGIOPLASTY  03/28/2024   Procedure: VENOUS ANGIOPLASTY;  Surgeon: Magda Debby SAILOR, MD;  Location: [facility];  Service: Cardiovascular;;  Cephalic Vein   Social History   Socioeconomic History   Marital status: Married    Spouse name: Not on file   Number of children: Not on file    Years of education: Not on file   Highest education level: Not on file  Occupational History   Not on file  Tobacco Use   Smoking status: Never   Smokeless tobacco: Never  Vaping Use   Vaping status: Never Used  Substance and Sexual Activity   Alcohol  use: Yes    Alcohol /week: 1.0 standard drink of alcohol     Types: 1 Glasses of wine per week    Comment: occas   Drug use: No   Sexual activity: Yes    Birth control/protection: None  Other Topics Concern   Not on file  Social History Narrative   Not on file   Social Drivers of Health   Financial Resource Strain: Low Risk  (10/28/2023)   Received from [facility]   Overall Financial Resource Strain (CARDIA)    Difficulty of Paying Living Expenses: Not hard at all  Food Insecurity: No Food Insecurity (10/28/2023)   Received from [facility]   Hunger Vital Sign    Within the past 12 months, you worried that your food would run out before you got the money to buy more.: Never true    Within the past 12 months, the food you bought just didn't last and you didn't have money to get more.: Never true  Transportation Needs: No Transportation Needs (10/28/2023)   Received from [facility] - Transportation    Lack of Transportation (Medical): No    Lack of Transportation (Non-Medical): No  Physical Activity: Unknown (10/28/2023)   Received from [facility]   Exercise Vital Sign    On average, how many days per week do you engage in moderate to strenuous exercise (like a brisk walk)?: 0 days    Minutes of Exercise per Session: Not on file  Stress: Stress Concern Present (10/28/2023)   Received from [facility] - Occupational Stress Questionnaire    Feeling of Stress : To some extent  Social Connections: Socially Integrated (10/28/2023)   Received from [facility]   Social Network    How would you rate your social network (family, work, friends)?: Good participation with  social networks   Family History  Problem Relation Age of Onset   High blood pressure Mother    Diabetes Mother    Arthritis Mother    Hyperlipidemia Mother    Heart disease Mother    High blood pressure Father    Hyperlipidemia Father    Dementia Father    High blood pressure Sister    Diabetes Brother    Hyperlipidemia Brother    High blood pressure Brother    Colon cancer Neg Hx    Stomach cancer Neg Hx    Esophageal cancer Neg Hx    Pancreatic cancer Neg Hx    Liver disease Neg Hx    Allergies  Allergen Reactions   Amlodipine Swelling    Pelvis swells   Humalog  Kwikpen [Insulin  Lispro] Other (See Comments)    75/50---swelling and ulcers in mouth  Prior to Admission medications   Medication Sig Start Date End Date Taking? Authorizing Provider  ASHWAGANDHA PO Take 1 capsule by mouth daily at 12 noon.   Yes [provider]  cetirizine (ZYRTEC) 10 MG tablet Take 10 mg by mouth daily.   Yes [provider]  diphenhydrAMINE  (BENADRYL ) 25 mg capsule Take 1 capsule (25 mg total) by mouth 3 (three) times a week for 90 doses. Prior to dialysis Sherri Dixon taking differently: Take 50 mg by mouth daily as needed for allergies or itching. Prior to dialysis 10/14/23 05/10/24 Yes [place], Camellia PARAS, DO  gabapentin  (NEURONTIN ) 100 MG capsule Take 1 capsule (100 mg total) by mouth at bedtime. Sherri Dixon taking differently: Take 100 mg by mouth at bedtime as needed (pain). 10/14/23 04/29/24 Yes [place], Eric J, DO  hydrALAZINE  (APRESOLINE ) 50 MG tablet Take 1 tablet by mouth in the morning and at bedtime. 03/24/24  Yes [provider]  insulin  aspart (NOVOLOG ) 100 UNIT/ML FlexPen Use as directed before each meal 3 times a day, 140-199 - 2 units, 200-250 - 4 units, 251-299 - 6 units,  300-349 - 8 units,  350 or above 10 units. Sherri Dixon taking differently: Inject 2-8 Units into the skin 3 (three) times daily with meals. 10/06/23  Yes Singh, Prashant K, MD  Insulin  Glargine (BASAGLAR   KWIKPEN) 100 UNIT/ML Inject 36 Units into the skin daily.   Yes [provider]  losartan (COZAAR) 25 MG tablet Take 50 mg by mouth at bedtime. 12/01/23  Yes [provider]  nitroGLYCERIN  (NITROSTAT ) 0.4 MG SL tablet Take by mouth. 04/17/19  Yes [provider]  omeprazole  (PRILOSEC) 20 MG capsule TAKE 1 CAPSULE (20 MG TOTAL) BY MOUTH 2 (TWO) TIMES DAILY BEFORE A MEAL. Sherri Dixon taking differently: Take 20 mg by mouth daily as needed (acid reflux or indigestion). 04/14/23  Yes Hope Almarie ORN, NP  rosuvastatin  (CRESTOR ) 10 MG tablet Take 1 tablet (10 mg total) by mouth daily. 10/15/23  Yes [place], Camellia PARAS, DO  sevelamer carbonate (RENVELA) 800 MG tablet Take 1 tablet by mouth 3 (three) times daily with meals. 03/15/24 10/18/24 Yes [provider]  apixaban  (ELIQUIS ) 5 MG TABS tablet Take 1 tablet (5 mg total) by mouth 2 (two) times daily. Sherri Dixon not taking: Reported on 04/29/2024 10/06/23   Singh, Prashant K, MD  Blood Glucose Monitoring Suppl (BLOOD GLUCOSE MONITOR SYSTEM) w/Device KIT Use as directed in the morning, at noon, and at bedtime. 10/06/23   Singh, Prashant K, MD  Continuous Blood Gluc Sensor (DEXCOM G6 SENSOR) MISC by Does not apply route. Sherri Dixon not taking: Reported on 04/29/2024 08/13/22   [provider]  famotidine  (PEPCID ) 20 MG tablet Take 1 tablet (20 mg total) by mouth 3 (three) times a week. Prior to dialysis Sherri Dixon not taking: Reported on 04/29/2024 10/14/23   [place], Camellia PARAS, DO  hydrALAZINE  (APRESOLINE ) 100 MG tablet Take 1 tablet (100 mg total) by mouth every 8 (eight) hours. Sherri Dixon not taking: Reported on 04/29/2024 10/06/23   Singh, Prashant K, MD  Insulin  Pen Needle 32G X 4 MM MISC Use as directed 4 times a day for subcutaneous insulin  injection. 10/06/23   Singh, Prashant K, MD  VITAMIN D , CHOLECALCIFEROL, PO Take 1 capsule by mouth Every Tuesday,Thursday,and Saturday with dialysis. Sherri Dixon not taking: Reported on 04/29/2024    [provider]     Positive ROS: All other systems have been reviewed and were otherwise negative with the exception of those mentioned in the HPI  and as above.  Physical Exam:  There were no vitals filed for this visit. General: Alert, no acute distress Cardiovascular: No pedal edema Respiratory: No cyanosis, no use of accessory musculature GI: No organomegaly, abdomen is soft and non-tender Skin: No lesions in the area of chief complaint Neurologic: Sensation intact distally Psychiatric: Sherri Dixon is competent for consent with normal mood and affect Lymphatic: No axillary or cervical lymphadenopathy  MUSCULOSKELETAL: Right foot demonstrates a large area of blistering and hypertrophic skin along the distal medial aspect of her foot.  Some drainage noted.  Some foul odor noted.  Baseline sensory deficits.  Ankle dorsiflexion plantarflexion intact.  Assessment: Right chronic foot blistering and concern for deep ulceration in the setting of prior transmetatarsal amputation and diabetes with neuropathy   Plan: Plan for wound debridement with incision and drainage of deep abscess with attempt at closure with soft tissue rearrangement of the right foot chronic wound.  We discussed the risks, benefits and alternatives of surgery which include but are not limited to wound healing complications, infection, nonunion, malunion, need for further surgery, damage to surrounding structures and continued pain.  They understand there is no guarantees to an acceptable outcome.  After weighing these risks they opted to proceed with surgery.     Lonni JONELLE Pae, MD    05/03/2024 7:24 AM

## 2024-05-03 NOTE — Transfer of Care (Signed)
 Immediate Anesthesia Transfer of Care Note  Patient: Sherri Dixon  Procedure(s) Performed: RIGHT FOOT INCISION AND DRAINAGE OF DEEP FOOT ABSCESS DEBRIDEMENT OF CHRONIC ULCERATION, ADJACENT SOFT TISSUE TRANSFER (Right)  Patient Location: PACU  Anesthesia Type:General  Level of Consciousness: awake, alert , oriented, and patient cooperative  Airway & Oxygen Therapy: Patient Spontanous Breathing and Patient connected to face mask oxygen  Post-op Assessment: Report given to RN and Post -op Vital signs reviewed and stable  Post vital signs: Reviewed and stable  Last Vitals:  Vitals Value Taken Time  BP 185/97 05/03/24 11:00  Temp    Pulse 78 05/03/24 11:05  Resp 15 05/03/24 11:05  SpO2 100 % 05/03/24 11:05  Vitals shown include unfiled device data.  Last Pain:  Vitals:   05/03/24 0923  TempSrc:   PainSc: 0-No pain         Complications: No notable events documented.

## 2024-05-03 NOTE — Progress Notes (Signed)
 Orthopedic Tech Progress Note Patient Details:  Sherri Dixon June 07, 1981 [id number]  Ortho Devices Type of Ortho Device: Postop shoe/boot Ortho Device/Splint Location: right Ortho Device/Splint Interventions: Ordered, Application, Adjustment   Post Interventions Patient Tolerated: Well Instructions Provided: Adjustment of device, Care of device  Waylan Thom Loving 05/03/2024, 11:16 AM

## 2024-05-03 NOTE — Brief Op Note (Signed)
 05/03/2024  10:48 AM  PATIENT:  Sherri Dixon  43 y.o. female  PRE-OPERATIVE DIAGNOSIS:  RIGHT FOOT CHRONIC DIABETIC ULCERATION, RIGHT DEEP FOOT ABSCESS  POST-OPERATIVE DIAGNOSIS:  RIGHT FOOT CHRONIC DIABETIC ULCERATION, RIGHT DEEP FOOT ABSCESS  PROCEDURE:  Procedure(s): IRRIGATION AND DEBRIDEMENT WOUND (Right) CREATION, FLAP, ROTATION (Right)  SURGEON:  Surgeons and Role:    * Elsa Lonni SAUNDERS, MD - Primary  PHYSICIAN ASSISTANT:   ASSISTANTS: none   ANESTHESIA:   general  EBL:  Minimal   BLOOD ADMINISTERED:none  DRAINS: none   LOCAL MEDICATIONS USED:  NONE  SPECIMEN:  Source of Specimen:  Deep foot tissue  DISPOSITION OF SPECIMEN:  Micro  COUNTS:  YES  TOURNIQUET:  * No tourniquets in log *  DICTATION: .Dragon Dictation  PLAN OF CARE: Discharge to home after PACU  PATIENT DISPOSITION:  PACU - hemodynamically stable.   Delay start of Pharmacological VTE agent (>24hrs) due to surgical blood loss or risk of bleeding: no

## 2024-05-03 NOTE — Anesthesia Postprocedure Evaluation (Signed)
 Anesthesia Post Note  Patient: Sherri Dixon  Procedure(s) Performed: RIGHT FOOT INCISION AND DRAINAGE OF DEEP FOOT ABSCESS DEBRIDEMENT OF CHRONIC ULCERATION, ADJACENT SOFT TISSUE TRANSFER (Right)     Patient location during evaluation: PACU Anesthesia Type: General Level of consciousness: awake and alert Pain management: pain level controlled Vital Signs Assessment: post-procedure vital signs reviewed and stable Respiratory status: spontaneous breathing, nonlabored ventilation, respiratory function stable and patient connected to nasal cannula oxygen Cardiovascular status: blood pressure returned to baseline and stable Postop Assessment: no apparent nausea or vomiting Anesthetic complications: no   No notable events documented.  Last Vitals:  Vitals:   05/03/24 1200 05/03/24 1220  BP: (!) 153/81 (!) 168/104  Pulse: 79 88  Resp: 12 13  Temp: 36.4 C (!) 36.4 C  SpO2: 93% 100%    Last Pain:  Vitals:   05/03/24 1220  TempSrc:   PainSc: 4                  Zilla Shartzer

## 2024-05-03 NOTE — Anesthesia Procedure Notes (Signed)
 Procedure Name: LMA Insertion Date/Time: 05/03/2024 10:20 AM  Performed by: Franchot Delon RAMAN, CRNAPre-anesthesia Checklist: Patient identified, Emergency Drugs available, Suction available and Patient being monitored Patient Re-evaluated:Patient Re-evaluated prior to induction Oxygen Delivery Method: Circle System Utilized Preoxygenation: Pre-oxygenation with 100% oxygen Induction Type: IV induction Ventilation: Mask ventilation without difficulty LMA: LMA inserted LMA Size: 4.0 Number of attempts: 1 Placement Confirmation: positive ETCO2 Tube secured with: Tape Dental Injury: Teeth and Oropharynx as per pre-operative assessment

## 2024-05-04 ENCOUNTER — Encounter (HOSPITAL_COMMUNITY): Payer: Self-pay | Admitting: Orthopaedic Surgery

## 2024-05-08 LAB — AEROBIC/ANAEROBIC CULTURE W GRAM STAIN (SURGICAL/DEEP WOUND): Gram Stain: NONE SEEN

## 2024-05-19 ENCOUNTER — Other Ambulatory Visit: Payer: Self-pay

## 2024-05-19 DIAGNOSIS — N186 End stage renal disease: Secondary | ICD-10-CM

## 2024-06-01 ENCOUNTER — Other Ambulatory Visit: Payer: Self-pay

## 2024-06-01 ENCOUNTER — Encounter (HOSPITAL_COMMUNITY)
Admission: RE | Disposition: A | Discharge status: Home / Self Care | Payer: Self-pay | Source: Home / Self Care | Attending: Vascular Surgery

## 2024-06-01 ENCOUNTER — Ambulatory Visit (HOSPITAL_COMMUNITY)
Admission: RE | Admit: 2024-06-01 | Discharge: 2024-06-01 | Disposition: A | Discharge status: Home / Self Care | Attending: Vascular Surgery | Admitting: Vascular Surgery

## 2024-06-01 DIAGNOSIS — E1122 Type 2 diabetes mellitus with diabetic chronic kidney disease: Secondary | ICD-10-CM | POA: Diagnosis not present

## 2024-06-01 DIAGNOSIS — I120 Hypertensive chronic kidney disease with stage 5 chronic kidney disease or end stage renal disease: Secondary | ICD-10-CM | POA: Diagnosis not present

## 2024-06-01 DIAGNOSIS — Z833 Family history of diabetes mellitus: Secondary | ICD-10-CM | POA: Diagnosis not present

## 2024-06-01 DIAGNOSIS — T8249XA Other complication of vascular dialysis catheter, initial encounter: Secondary | ICD-10-CM | POA: Diagnosis not present

## 2024-06-01 DIAGNOSIS — T8241XA Breakdown (mechanical) of vascular dialysis catheter, initial encounter: Secondary | ICD-10-CM | POA: Insufficient documentation

## 2024-06-01 DIAGNOSIS — Y839 Surgical procedure, unspecified as the cause of abnormal reaction of the patient, or of later complication, without mention of misadventure at the time of the procedure: Secondary | ICD-10-CM | POA: Diagnosis not present

## 2024-06-01 DIAGNOSIS — Z992 Dependence on renal dialysis: Secondary | ICD-10-CM | POA: Diagnosis not present

## 2024-06-01 DIAGNOSIS — N186 End stage renal disease: Secondary | ICD-10-CM | POA: Diagnosis not present

## 2024-06-01 DIAGNOSIS — T82590A Other mechanical complication of surgically created arteriovenous fistula, initial encounter: Secondary | ICD-10-CM | POA: Diagnosis not present

## 2024-06-01 HISTORY — PX: A/V FISTULAGRAM: CATH118298

## 2024-06-01 HISTORY — PX: TUNNELLED CATHETER EXCHANGE: CATH118373

## 2024-06-01 LAB — GLUCOSE, CAPILLARY: Glucose-Capillary: 494 mg/dL — ABNORMAL HIGH (ref 70–99)

## 2024-06-01 SURGERY — TUNNELLED CATHETER EXCHANGE
Anesthesia: LOCAL

## 2024-06-01 MED ORDER — IODIXANOL 320 MG/ML IV SOLN
INTRAVENOUS | Status: DC | PRN
  Administered 2024-06-01: 15 mL

## 2024-06-01 MED ORDER — LIDOCAINE HCL (PF) 1 % IJ SOLN
INTRAMUSCULAR | Status: DC | PRN
  Administered 2024-06-01: 5 mL via INTRADERMAL
  Administered 2024-06-01: 25 mL via INTRADERMAL

## 2024-06-01 MED ORDER — HEPARIN SODIUM (PORCINE) 1000 UNIT/ML IJ SOLN
INTRAMUSCULAR | Status: DC | PRN
  Administered 2024-06-01: 5200 [IU] via INTRAVENOUS

## 2024-06-01 MED ORDER — LIDOCAINE HCL (PF) 1 % IJ SOLN
INTRAMUSCULAR | Status: AC
  Filled 2024-06-01: qty 30

## 2024-06-01 MED ORDER — HEPARIN (PORCINE) IN NACL 1000-0.9 UT/500ML-% IV SOLN
INTRAVENOUS | Status: DC | PRN
  Administered 2024-06-01: 500 mL

## 2024-06-01 MED ORDER — HEPARIN SODIUM (PORCINE) 1000 UNIT/ML IJ SOLN
INTRAMUSCULAR | Status: AC
  Filled 2024-06-01: qty 10

## 2024-06-01 SURGICAL SUPPLY — 7 items
GLIDEWIRE ADV .035X180CM (WIRE) IMPLANT
KIT CATH CHRNC PALINDROME 14.5 (CATHETERS) IMPLANT
KIT MICROPUNCTURE NIT STIFF (SHEATH) IMPLANT
KIT PV (KITS) ×1 IMPLANT
SHEATH PROBE COVER 6X72 (BAG) IMPLANT
TRAY PV CATH (CUSTOM PROCEDURE TRAY) ×1 IMPLANT
TUBING CIL FLEX 10 FLL-RA (TUBING) IMPLANT

## 2024-06-01 NOTE — H&P (Signed)
 HD ACCESS CENTER H&P   Patient ID: Sherri Dixon, female   DOB: 1981-07-18, 43 y.o.   MRN: [id number]  Subjective:     HPI Sherri Dixon is a 43 y.o. female with ESRD presenting to the HD access center for intervention.  Past Medical History:  Diagnosis Date   Anemia    Asthma    as a child   CHF (congestive heart failure) (HCC)    Pt reports that father has CHF, not her   Chronic kidney disease    Diabetes mellitus without complication (HCC)    Dyslipidemia    GERD (gastroesophageal reflux disease)    Hypertension    Pulmonary embolism (HCC)    Stroke (HCC)    Family History  Problem Relation Age of Onset   High blood pressure Mother    Diabetes Mother    Arthritis Mother    Hyperlipidemia Mother    Heart disease Mother    High blood pressure Father    Hyperlipidemia Father    Dementia Father    High blood pressure Sister    Diabetes Brother    Hyperlipidemia Brother    High blood pressure Brother    Colon cancer Neg Hx    Stomach cancer Neg Hx    Esophageal cancer Neg Hx    Pancreatic cancer Neg Hx    Liver disease Neg Hx    Past Surgical History:  Procedure Laterality Date   A/V SHUNT INTERVENTION Right 03/28/2024   Procedure: A/V SHUNT INTERVENTION;  Surgeon: Magda Debby SAILOR, MD;  Location: [facility];  Service: Cardiovascular;  Laterality: Right;   APPLICATION OF WOUND VAC Right 04/12/2021   Procedure: APPLICATION OF WOUND VAC;  Surgeon: Elsa Lonni SAUNDERS, MD;  Location: [facility] OR;  Service: Orthopedics;  Laterality: Right;   AV FISTULA PLACEMENT Right 01/20/2024   Procedure: RIGHT ARM ARTERIOVENOUS (AV) BRACHIOCEPHALIC FISTULA CREATION;  Surgeon: Pearline Norman RAMAN, MD;  Location: [facility] OR;  Service: Vascular;  Laterality: Right;   DIALYSIS/PERMA CATHETER INSERTION N/A 10/02/2023   Procedure: DIALYSIS/PERMA CATHETER INSERTION;  Surgeon: Pearline Norman RAMAN, MD;  Location: [facility] INVASIVE CV LAB;  Service: Cardiovascular;  Laterality: N/A;   DIALYSIS/PERMA CATHETER  INSERTION N/A 02/29/2024   Procedure: DIALYSIS/PERMA CATHETER INSERTION;  Surgeon: Pearline Norman RAMAN, MD;  Location: [facility] INVASIVE CV LAB;  Service: Cardiovascular;  Laterality: N/A;   I & D EXTREMITY Right 04/06/2021   Procedure: IRRIGATION AND DEBRIDEMENT TOE WITH WOUND VAC PLACEMENT;  Surgeon: Elsa Lonni SAUNDERS, MD;  Location: [facility] OR;  Service: Orthopedics;  Laterality: Right;   I & D EXTREMITY Right 04/12/2021   Procedure: IRRIGATION AND DEBRIDEMENT RIGHT FOOT;  Surgeon: Elsa Lonni SAUNDERS, MD;  Location: [facility] OR;  Service: Orthopedics;  Laterality: Right;   INCISION AND DRAINAGE OF WOUND Right 05/03/2024   Procedure: RIGHT FOOT INCISION AND DRAINAGE OF DEEP FOOT ABSCESS DEBRIDEMENT OF CHRONIC ULCERATION, ADJACENT SOFT TISSUE TRANSFER;  Surgeon: Elsa Lonni SAUNDERS, MD;  Location: [facility];  Service: Orthopedics;  Laterality: Right;   IR FLUORO GUIDE CV LINE RIGHT  10/01/2023   IR FLUORO GUIDE CV LINE RIGHT  10/01/2023   IR FLUORO GUIDE CV LINE RIGHT  10/02/2023   IR US  GUIDE VASC ACCESS RIGHT  10/01/2023   MOUTH SURGERY     ROBOTIC ASSITED PARTIAL NEPHRECTOMY Right 07/28/2019   Procedure: XI ROBOTIC ASSITED PARTIAL NEPHRECTOMY;  Surgeon: Sherrilee Belvie CROME, MD;  Location: [facility];  Service: Urology;  Laterality: Right;  3 HRS   SECONDARY CLOSURE  OF WOUND Right 06/18/2021   Procedure: SECONDARY CLOSURE OF WOUND tRANSMETATARSAL AMPUTAION;  Surgeon: Elsa Lonni SAUNDERS, MD;  Location: [facility] OR;  Service: Orthopedics;  Laterality: Right;   TEMPORARY DIALYSIS CATHETER N/A 02/29/2024   Procedure: TEMPORARY DIALYSIS CATHETER;  Surgeon: Pearline Norman RAMAN, MD;  Location: [facility] INVASIVE CV LAB;  Service: Cardiovascular;  Laterality: N/A;   TRANSMETATARSAL AMPUTATION Right 04/12/2021   Procedure: TRANSMETATARSAL AMPUTATION;  Surgeon: Elsa Lonni SAUNDERS, MD;  Location: [facility] OR;  Service: Orthopedics;  Laterality: Right;   TUNNELLED CATHETER EXCHANGE N/A 01/05/2024   Procedure: TUNNELLED CATHETER EXCHANGE;  Surgeon: Tobie Gordy POUR,  MD;  Location: [facility] INVASIVE CV LAB;  Service: Cardiovascular;  Laterality: N/A;   VENOUS ANGIOPLASTY  03/28/2024   Procedure: VENOUS ANGIOPLASTY;  Surgeon: Magda Debby SAILOR, MD;  Location: [facility];  Service: Cardiovascular;;  Cephalic Vein    Short Social History:  Social History   Tobacco Use   Smoking status: Never   Smokeless tobacco: Never  Substance Use Topics   Alcohol  use: Yes    Alcohol /week: 1.0 standard drink of alcohol     Types: 1 Glasses of wine per week    Comment: occas    Allergies  Allergen Reactions   Amlodipine Swelling    Pelvis swells   Humalog  Kwikpen [Insulin  Lispro] Other (See Comments)    75/50---swelling and ulcers in mouth     No current facility-administered medications for this encounter.    REVIEW OF SYSTEMS All other systems were reviewed and are negative     Objective:   Objective   Vitals:   06/01/24 0900 06/01/24 0913  BP: (!) 222/101 (!) 222/101  Pulse: 77 78  Resp: 12 18  Temp: 98.1 F (36.7 C)   TempSrc: Oral   SpO2: 99% 100%   There is no height or weight on file to calculate BMI.  Physical Exam General: no acute distress Cardiac: hemodynamically stable Extremities: Faint thrill in right arm AV fistula  Data: Reviewed fistulogram from March 28, 2024 with Dr. Magda. A juxta anastomotic stenosis was treated with a 6 mm Mustang balloon     Assessment/Plan:   Sherri Dixon is a 43 y.o. female with ESRD presenting for [facility] exchange.  Having issues with [facility] malfunction, right groin and slow maturation of right arm AV fistula. Reviewed risks and benefits of right groin tunneled dialysis catheter exchange and right arm AV fistulogram and patient agreed to proceed.   Norman Pearline, MD [facility]

## 2024-06-01 NOTE — Op Note (Signed)
    Patient name: Sherri Dixon MRN: [id number] DOB: [birth year] Sex: female  06/01/2024 Pre-operative Diagnosis: ESRD on HD Post-operative diagnosis:  Same Surgeon:  Norman GORMAN Serve, MD Procedure Performed:  [phone], ultrasound-guided access of dialysis circuit with interpretation of fistulogram and central venogram Right femoral tunneled dialysis catheter exchange under fluoroscopic guidance  Indications: [name] is a 43 year old female with ESRD on HD presenting for right femoral tunneled dialysis catheter exchange.  She has been having issues with TDC malfunction.  She is also had slow maturation of the right arm AV fistula.  Dr. Magda ballooned a juxta anastomotic stenosis on April 27, 2024 although it still been slow to dilate.  Risks and benefits of fistulogram and tunneled dialysis catheter exchange were reviewed and she elected to proceed.  Findings:  Widely patent central venous system.  Widely patent AV fistula.  The juxta anastomotic stenosis has some intraluminal filling defect but appears patent.  There is a large branch a few centimeters from the anastomosis.   Procedure:  The patient was identified in the holding area and taken to the cath lab  The patient was then placed supine on the table and prepped and draped in the usual sterile fashion.  A time out was called.  Ultrasound was used to evaluate the right arm AV access. This was accessed under u/s guidance. An 018 wire was advanced without resistance, a micropuncture sheath was placed and fistulagram obtained which demonstrated the above findings.  The micropuncture sheath was removed and manual pressure was held and attention was turned to the right groin TDC exchange. The catheter was visualized under fluoroscopy and a glide advantage wire was placed through one of the ports and into the IVC. Using lidocaine  and blunt dissection the cuff was then freed from the surrounding tissue attachments. The catheter was then removed over the  glide advantage wire leaving the wire in place in the IVC. The new 44 cm catheter was then tracked over this Glide vantage wire with its stylette in place. This was placed into the atriocaval junction. The stylette and wire were removed. Both ports aspirated and flushed with ease. They were heparin  locked and the catheter was fastened to the skin with a nylon suture.    Contrast: 15 cc Sedation: None  Impression: Successful exchange of right femoral tunnel dialysis catheter under fluoroscopic guidance  The AV fistula appears patent without any flow-limiting stenosis although there is a large branch a few centimeters from the anastomosis.  I offered branch ligation in the OR within the next few weeks and she elected to proceed.   Norman GORMAN Serve MD [facility] Office: [phone]

## 2024-06-02 ENCOUNTER — Other Ambulatory Visit: Payer: Self-pay

## 2024-06-02 ENCOUNTER — Encounter (HOSPITAL_COMMUNITY): Payer: Self-pay | Admitting: Vascular Surgery

## 2024-06-02 DIAGNOSIS — N186 End stage renal disease: Secondary | ICD-10-CM

## 2024-06-08 NOTE — Op Note (Addendum)
 Addendum 07/25/2024: After debridement of the wound plantarly there was a large defect along the medial aspect of the foot that was unable to be closed primarily.  The defect size was 2 cm x 3 cm.  Decision was made to perform a local soft tissue rearrangement via Z-plasty to close the wound.  This was performed.  We were able to get the wound closed.  Addendum 09/05/2024: Additional information regarding local soft tissue rearrangement.  The primary defect as a result of excision of the dead tissue was 2 cm x 3 cm.  Z-plasty was then performed in this area creating a defect of approximately 4 cm x 4 cm.  Then the soft tissue was rearranged closing the wound completely.  There was no resultant defect after soft tissue rearrangement.  The additive soft tissue defect resulting from the primary defect and the secondary defect are 9 cm +16 cm equaling 25 cm which is greater than 10.1 cm   Sherri Dixon female 43 y.o. 05/03/2024  PreOperative Diagnosis: Chronic right foot diabetic ulceration, full-thickness Deep foot abscess, multiple bursal cavities   PostOperative Diagnosis: Chronic right foot diabetic ulceration, full-thickness Deep foot abscess, multiple bursal cavities  PROCEDURE: Debridement of chronic ulceration, right foot Right foot incision and drainage of deep foot abscess, multiple bursal cavities Adjacent soft tissue transfer, Z-plasty  SURGEON: Lonni Pae, MD  ASSISTANT: None  ANESTHESIA: General  FINDINGS: After debridement of the ulceration and removal of the abscess cavity there was a soft tissue defect requiring adjacent soft tissue transfer to close the skin in a tension-free fashion.  IMPLANTS: None  INDICATIONS:43 y.o. 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  She had been walking in regular shoes and developed a chronic ulceration on the plantar medial aspect of the foot.  There was concern for deep abscess as well as  some significant hypertrophic soft tissue formation in the area and was indicated for surgery.   Patient understood the risks, benefits and alternatives to surgery which include but are not limited to wound healing complications, infection, nonunion, malunion, need for further surgery as well as damage to surrounding structures. They also understood the potential for continued pain in that there were no guarantees of acceptable outcome After weighing these risks the patient opted to proceed with surgery.  PROCEDURE: Patient was identified in the preoperative holding area.  The right foot was marked by myself.  Consent was signed by myself and the patient.  Block was performed by anesthesia in the preoperative holding area.  Patient was taken to the operative suite and placed supine on the operative table.  General anesthesia was induced without difficulty. Bump was placed under the operative hip and bone foam was used.  All bony prominences were well padded.  Tourniquet was placed on the operative thigh.  Preoperative antibiotics were given. The extremity was prepped and draped in the usual sterile fashion and surgical timeout was performed.    We began by inspecting the area.  We proceeded with debridement of the chronic appearing ulceration.  Debridement type: Excisional Debridement  Side: right  Body Location: Foot  Tools used for debridement: scalpel, scissors, curette, and rongeur  Pre-debridement Wound size (cm):   Length: 4 cm        width: 4 cm     depth: 2 cm  Post-debridement Wound size (cm):   Length: 4.5 cm        width: 4.5 cm     depth: 3.5  cm  Debridement depth beyond dead/damaged tissue down to healthy viable tissue: yes  Tissue layer involved: skin, subcutaneous tissue, muscle / fascia  Nature of tissue removed: Slough, Necrotic, Devitalized Tissue, Non-viable tissue, and Purulence  Irrigation volume: 1000 cc     Irrigation fluid type: Normal Saline  Within the base of  the ulceration there was a large area of fibrous tissue deep within there that was fully removed as well.  We then proceeded to inspect the abscess cavity.  Using a Kelly clamp we are able to deloculated areas of abscess cavity within the multiple bursal spaces on the plantar foot.  Irrigation was then run through the abscess cavities within the plantar medial foot.  After acceptable irrigant was performed we inspected the area.  There was a large soft tissue defect that was not able to be closed primarily so decision was made to perform a Z-plasty and soft tissue rearrangement to close the wound rather than place a wound VAC.  At the apex of the incisions Z-plasty incisions were performed medial and lateral from the distal to the proximal aspect.  Then the soft tissue was rearranged locally to allow for a more tension-free closure.  2-0 nylon suture was used to close the area once we realign the skin and transferred the corners to the apex.  The skin was then closed and there was no remaining open portion of the wound.  Then soft dressing was placed.  She was then awakened from anesthesia and taken recovery in stable condition.  No complications.  She tolerated the procedure well.    POST OPERATIVE INSTRUCTIONS: Nonweightbearing to operative extremity Keep dressing in place until follow-up   TOURNIQUET TIME: No tourniquet  BLOOD LOSS:  Minimal         DRAINS: none         SPECIMEN: none       COMPLICATIONS:  * No complications entered in OR log *         Disposition: PACU - hemodynamically stable.         Condition: stable

## 2024-06-13 ENCOUNTER — Other Ambulatory Visit: Payer: Self-pay

## 2024-06-13 ENCOUNTER — Encounter (HOSPITAL_COMMUNITY): Payer: Self-pay | Admitting: Vascular Surgery

## 2024-06-13 NOTE — Progress Notes (Signed)
 SDW call  Patient was given pre-op  instructions over the phone. Patient verbalized understanding of instructions provided.     PCP - Lyle Setters, NP Cardiologist -  Pulmonary:    PPM/ICD - denies Device Orders - na Rep Notified - na   Chest x-ray - 10/12/2023 EKG -  12/30/2023 Stress Test - 04/26/2019 ECHO - 10/06/2023 Cardiac Cath -   Sleep Study/sleep apnea/CPAP: denies  Type II diabetic. A1C 13.2 05/19/2024.  Fasting Blood sugar range: >200 How often check sugars: TID   Blood Thinner Instructions: denies, states has been off Eliquis  for approx 3 months for financial reasons Aspirin  Instructions:denies   ERAS Protcol - NPO   Anesthesia review: Yes. Hx PE, DM, HTN, CHF, stroke, ESRD with dialysis   Patient denies shortness of breath, fever, cough and chest pain over the phone call  Your procedure is scheduled on Tuesday June 14, 2024  Report to [facility] Main Entrance A at  1045  A.M., then check in with the Admitting office.  Call this number if you have problems the morning of surgery:  [phone]   If you have any questions prior to your surgery date call [phone]: Open Monday-Friday 8am-4pm If you experience any cold or flu symptoms such as cough, fever, chills, shortness of breath, etc. between now and your scheduled surgery, please notify us  at the above number    Remember:  Do not eat or drink after midnight the night before your surgery  Take these medicines the morning of surgery with A SIP OF WATER :  Zyrtec, hydralazine , crestor    As needed: Prilosec, neurontin   As of today, STOP taking any Aspirin  (unless otherwise instructed by your surgeon) Aleve, Naproxen, Ibuprofen , Motrin , Advil , Goody's, BC's, all herbal medications, fish oil, and all vitamins.

## 2024-06-13 NOTE — Anesthesia Preprocedure Evaluation (Signed)
 Anesthesia Evaluation  Patient identified by MRN, date of birth, ID band Patient awake    Reviewed: Allergy & Precautions, H&P , NPO status , Patient's Chart, lab work & pertinent test results  Airway Mallampati: II  TM Distance: >3 FB Neck ROM: Full    Dental no notable dental hx. (+) Chipped, Dental Advisory Given   Pulmonary asthma    Pulmonary exam normal breath sounds clear to auscultation       Cardiovascular hypertension,  Rhythm:Regular Rate:Normal     Neuro/Psych negative neurological ROS  negative psych ROS   GI/Hepatic Neg liver ROS,GERD  Medicated,,  Endo/Other  diabetes, Insulin  Dependent  Class 3 obesity  Renal/GU ESRF and DialysisRenal disease  negative genitourinary   Musculoskeletal   Abdominal   Peds  Hematology  (+) Blood dyscrasia, anemia   Anesthesia Other Findings   Reproductive/Obstetrics negative OB ROS                              Anesthesia Physical Anesthesia Plan  ASA: 3  Anesthesia Plan: General   Post-op Pain Management: Tylenol  PO (pre-op )*   Induction: Intravenous  PONV Risk Score and Plan: 4 or greater and Ondansetron , Dexamethasone , Propofol  infusion and TIVA  Airway Management Planned: LMA  Additional Equipment:   Intra-op Plan:   Post-operative Plan: Extubation in OR  Informed Consent: I have reviewed the patients History and Physical, chart, labs and discussed the procedure including the risks, benefits and alternatives for the proposed anesthesia with the patient or authorized representative who has indicated his/her understanding and acceptance.     Dental advisory given  Plan Discussed with: CRNA  Anesthesia Plan Comments: (PAT note written 06/13/2024 by Maymunah Stegemann, PA-C.  )         Anesthesia Quick Evaluation

## 2024-06-13 NOTE — Progress Notes (Signed)
 Anesthesia Chart Review: SAME DAY WORK-UP  Case: [id number] Date/Time: 06/14/24 1303   Procedure: LIGATION OF COMPETING BRANCHES OF ARTERIOVENOUS FISTULA (Right)   Anesthesia type: Choice   Diagnosis: ESRD (end stage renal disease) (HCC) [N18.6]   Pre-op  diagnosis: ESRD   [facility] / [facility]   Surgeons: Pearline Norman RAMAN, MD       DISCUSSION: Patient is a 43 year old female scheduled for the above procedure. S/p right femoral TDC exchange 06/01/2024, s/p creation right brachiocephalic AVF 01/20/2024.    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    Last cardiology visit noted with Dr. Zhao was on 11/04/23.  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  Admission in January 2025 for progressive CKD with volume overload, anasarca, pericardial effusion.  Hemodialysis was initiated on 10/03/2023 via right femoral TDC.  Antihypertensives were held due to hypotension.  Now takes losartan 25 mg nightly and  hydralazine  on non-HD days.  She may be ultimately considered for transplant evaluation.  Echo ordered to reevaluate moderate to large pericardial effusion which have been noted on echo prior to starting dialysis, and six month office follow-up planned.    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 MVA = 2.750 cm2), small  pericardial effusion.  She subsequently had a NM PYP Amyloid Spect w/CT on 12/30/23 that showed no evidence of cardiac amyloid.  Next cardiology follow-up is scheduled for 07/07/2024.   She is not currently taking Eliquis  (reportedly said she has been out for ~ 3 months due to financial reasons).  Historically, DM does not appear well controlled. BP is variable, but at least intermittent readings that have been significantly elevated. Meds include Tresiba  36 units Q HS, Novolog  SSI with meals, hydralazine  50 mg BID.  Anesthesia team to evaluate on the day of surgery. Definitive plan depending on exam findings and results.     VS: Ht 5' 5 (1.651 m)   Wt 95.3 kg   LMP 05/19/2024 (Approximate)   BMI 34.95 kg/m  BP Readings from Last 3 Encounters:  06/01/24 (!) 219/92  05/03/24 (!) 168/104  03/28/24 (!) 161/86   Pulse Readings from Last 3 Encounters:  06/01/24 82  05/03/24 88  03/28/24 78    PROVIDERS: Arby Lyle LABOR, NP is PCP Maryelizabeth Bellingham, MD is cardiologist  Dolan Casco, MD is nephrologist Leopold Nest, NP is endocrinology provider [place])   LABS: For day of surgery. HGB 9.2 06/04/2024, A1c 13.2% 05/19/2024.   EKG: EKG 12/29/23: Sinus rhythm Borderline prolonged QT interval Confirmed by Mannie Pac [phone]) on 12/31/2023 12:32:37 AM - QT/QTcB 451/494 ms     CV: NM PYP Amyloid Spect w/CT 12/30/23 ([facility]): IMPRESSION:  1. No evidence of cardiac amyloid.      Echo (Complete) 12/16/23 ([facility]): IMPRESSION: Left Ventricle: Systolic function is normal. EF: 65-70%. Quantitative  analysis of left ventricular Global Longitudinal Strain (GLS) imaging is  -13.5%, which is abnormal. Strain  imaging pattern suggests cardiac  amyloidosis.   Left Ventricle: There is at least moderate concentric hypertrophy  (walls were not visualized).    Mitral Valve: There is mild stenosis, with a mean gradient of 3.000  mmHg. P1/2T = 80.000 ms. MVA = 2.750 cm2.    Mitral Valve:  The leaflets are moderately thickened and exhibit mildly  reduced excursion.    Pericardium: There is a small pericardial effusion noted.   Normal LVEF with strain pattern suggestive of amyloidosis  At least moderate concentric left ventricular hypertrophy (walls were  difficult to visualize)  MAC with mild MS  Small pericardial effusion      Echo (Limited) 10/06/23: IMPRESSIONS   1. Left ventricular ejection fraction, by estimation, is 60 to 65%. The  left ventricle has normal function. The left ventricle has no regional  wall motion abnormalities. There is mild left ventricular hypertrophy.   2. Right ventricular systolic function is normal. The right ventricular  size is normal.   3. Left atrial size was mildly dilated.   4. Effusion is slightly large than TTE done 09/28/23 IVC is not dilated  and collpases with inspiration with no RA/RV diastolic collapse no  tamponade Now measures 2.4 cm lateral to LV posterior wall . moderate to  large. The pericardial effusion is  circumferential.   5. The mitral valve was not assessed. No evidence of mitral valve  regurgitation. No evidence of mitral stenosis.   6. The aortic valve was not assessed. Aortic valve regurgitation is not  visualized. No aortic stenosis is present.   7. The inferior vena cava is normal in size with greater than 50%  respiratory variability, suggesting right atrial pressure of 3 mmHg.      Nuclear stress test 04/26/19: IMPRESSION: 1. No reversible ischemia or infarction. 2. Normal left ventricular wall motion. 3. Left ventricular ejection fraction is 59%. 4. Non invasive risk stratification: Low     Past Medical History:  Diagnosis Date   Anemia    Asthma    as a child   CHF (congestive heart failure) (HCC)    Pt reports that father has CHF, not her   Chronic kidney disease    Diabetes mellitus without complication (HCC)    Dyslipidemia    GERD (gastroesophageal reflux disease)    Hypertension     Pulmonary embolism (HCC)    Stroke [facility])     Past Surgical History:  Procedure Laterality Date   A/V FISTULAGRAM N/A 06/01/2024   Procedure: A/V Fistulagram;  Surgeon: Pearline Norman RAMAN, MD;  Location: [facility];  Service: Cardiovascular;  Laterality: N/A;   A/V SHUNT INTERVENTION Right 03/28/2024   Procedure: A/V SHUNT INTERVENTION;  Surgeon: Magda Debby SAILOR, MD;  Location: [facility];  Service: Cardiovascular;  Laterality: Right;   APPLICATION OF WOUND VAC Right 04/12/2021   Procedure: APPLICATION OF WOUND VAC;  Surgeon: Elsa Lonni SAUNDERS, MD;  Location: [facility] OR;  Service: Orthopedics;  Laterality: Right;   AV FISTULA PLACEMENT Right 01/20/2024   Procedure: RIGHT ARM ARTERIOVENOUS (AV) BRACHIOCEPHALIC FISTULA CREATION;  Surgeon: Pearline Norman RAMAN, MD;  Location: [facility] OR;  Service: Vascular;  Laterality: Right;   DIALYSIS/PERMA CATHETER INSERTION N/A 10/02/2023   Procedure: DIALYSIS/PERMA CATHETER INSERTION;  Surgeon: Pearline Norman RAMAN, MD;  [facility] IN[facility];  Service: Cardiovascular;  Laterality: N/A;   DIALYSIS/PERMA CATHETER INSERTION N/A 02/29/2024   Procedure: DIALYSIS/PERMA CATHETER INSERTION;  Surgeon: Pearline Norman RAMAN, MD;  Location: [facility] INVASIVE CV LAB;  Service:  Cardiovascular;  Laterality: N/A;   I & D EXTREMITY Right 04/06/2021   Procedure: IRRIGATION AND DEBRIDEMENT TOE WITH WOUND VAC PLACEMENT;  Surgeon: Elsa Lonni SAUNDERS, MD;  Location: [facility] OR;  Service: Orthopedics;  Laterality: Right;   I & D EXTREMITY Right 04/12/2021   Procedure: IRRIGATION AND DEBRIDEMENT RIGHT FOOT;  Surgeon: Elsa Lonni SAUNDERS, MD;  Location: [facility] OR;  Service: Orthopedics;  Laterality: Right;   INCISION AND DRAINAGE OF WOUND Right 05/03/2024   Procedure: RIGHT FOOT INCISION AND DRAINAGE OF DEEP FOOT ABSCESS DEBRIDEMENT OF CHRONIC ULCERATION, ADJACENT SOFT TISSUE TRANSFER;  Surgeon: Elsa Lonni SAUNDERS, MD;  Location: [facility] ORS;  Service: Orthopedics;  Laterality: Right;   IR FLUORO GUIDE CV LINE RIGHT   10/01/2023   IR FLUORO GUIDE CV LINE RIGHT  10/01/2023   IR FLUORO GUIDE CV LINE RIGHT  10/02/2023   IR US  GUIDE VASC ACCESS RIGHT  10/01/2023   MOUTH SURGERY     ROBOTIC ASSITED PARTIAL NEPHRECTOMY Right 07/28/2019   Procedure: XI ROBOTIC ASSITED PARTIAL NEPHRECTOMY;  Surgeon: Sherrilee Belvie CROME, MD;  Location: [facility] ORS;  Service: Urology;  Laterality: Right;  3 HRS   SECONDARY CLOSURE OF WOUND Right 06/18/2021   Procedure: SECONDARY CLOSURE OF WOUND tRANSMETATARSAL AMPUTAION;  Surgeon: Elsa Lonni SAUNDERS, MD;  Location: [facility] OR;  Service: Orthopedics;  Laterality: Right;   TEMPORARY DIALYSIS CATHETER N/A 02/29/2024   Procedure: TEMPORARY DIALYSIS CATHETER;  Surgeon: Pearline Norman RAMAN, MD;  Location: [facility] INVASIVE CV LAB;  Service: Cardiovascular;  Laterality: N/A;   TRANSMETATARSAL AMPUTATION Right 04/12/2021   Procedure: TRANSMETATARSAL AMPUTATION;  Surgeon: Elsa Lonni SAUNDERS, MD;  Location: [facility] OR;  Service: Orthopedics;  Laterality: Right;   TUNNELLED CATHETER EXCHANGE N/A 01/05/2024   Procedure: TUNNELLED CATHETER EXCHANGE;  Surgeon: Tobie Gordy POUR, MD;  Location: [facility] INVASIVE CV LAB;  Service: Cardiovascular;  Laterality: N/A;   TUNNELLED CATHETER EXCHANGE N/A 06/01/2024   Procedure: TUNNELLED CATHETER EXCHANGE;  Surgeon: Pearline Norman RAMAN, MD;  Location: [facility];  Service: Cardiovascular;  Laterality: N/A;   VENOUS ANGIOPLASTY  03/28/2024   Procedure: VENOUS ANGIOPLASTY;  Surgeon: Magda Debby SAILOR, MD;  Location: [facility];  Service: Cardiovascular;;  Cephalic Vein    MEDICATIONS: No current facility-administered medications for this encounter.    cetirizine (ZYRTEC) 10 MG tablet   gabapentin  (NEURONTIN ) 100 MG capsule   hydrALAZINE  (APRESOLINE ) 50 MG tablet   insulin  aspart (NOVOLOG ) 100 UNIT/ML FlexPen   nitroGLYCERIN  (NITROSTAT ) 0.4 MG SL tablet   omeprazole  (PRILOSEC) 20 MG capsule   rosuvastatin  (CRESTOR ) 10 MG tablet   TRESIBA  FLEXTOUCH 100 UNIT/ML FlexTouch Pen   apixaban  (ELIQUIS ) 5 MG  TABS tablet   Blood Glucose Monitoring Suppl (BLOOD GLUCOSE MONITOR SYSTEM) w/Device KIT   Continuous Blood Gluc Sensor (DEXCOM G6 SENSOR) MISC   Insulin  Pen Needle 32G X 4 MM MISC    Isaiah Ruder, PA-C Surgical Short Stay/Anesthesiology [facility] Phone [phone] [facility] Phone [phone] 06/13/2024 1:59 PM

## 2024-06-14 ENCOUNTER — Ambulatory Visit (HOSPITAL_COMMUNITY): Payer: Self-pay | Admitting: Vascular Surgery

## 2024-06-14 ENCOUNTER — Other Ambulatory Visit (HOSPITAL_COMMUNITY): Payer: Self-pay

## 2024-06-14 ENCOUNTER — Encounter (HOSPITAL_COMMUNITY)
Admission: RE | Disposition: A | Discharge status: Home / Self Care | Payer: Self-pay | Source: Home / Self Care | Attending: Vascular Surgery

## 2024-06-14 ENCOUNTER — Other Ambulatory Visit: Payer: Self-pay

## 2024-06-14 ENCOUNTER — Encounter (HOSPITAL_COMMUNITY): Payer: Self-pay | Admitting: Vascular Surgery

## 2024-06-14 ENCOUNTER — Ambulatory Visit (HOSPITAL_COMMUNITY)
Admission: RE | Admit: 2024-06-14 | Discharge: 2024-06-14 | Disposition: A | Discharge status: Home / Self Care | Attending: Vascular Surgery | Admitting: Vascular Surgery

## 2024-06-14 ENCOUNTER — Ambulatory Visit (HOSPITAL_BASED_OUTPATIENT_CLINIC_OR_DEPARTMENT_OTHER): Payer: Self-pay | Admitting: Vascular Surgery

## 2024-06-14 DIAGNOSIS — T82590A Other mechanical complication of surgically created arteriovenous fistula, initial encounter: Secondary | ICD-10-CM

## 2024-06-14 DIAGNOSIS — T82898A Other specified complication of vascular prosthetic devices, implants and grafts, initial encounter: Secondary | ICD-10-CM | POA: Insufficient documentation

## 2024-06-14 DIAGNOSIS — D631 Anemia in chronic kidney disease: Secondary | ICD-10-CM | POA: Insufficient documentation

## 2024-06-14 DIAGNOSIS — I120 Hypertensive chronic kidney disease with stage 5 chronic kidney disease or end stage renal disease: Secondary | ICD-10-CM | POA: Diagnosis not present

## 2024-06-14 DIAGNOSIS — E785 Hyperlipidemia, unspecified: Secondary | ICD-10-CM | POA: Diagnosis not present

## 2024-06-14 DIAGNOSIS — E1122 Type 2 diabetes mellitus with diabetic chronic kidney disease: Secondary | ICD-10-CM | POA: Diagnosis not present

## 2024-06-14 DIAGNOSIS — E66813 Obesity, class 3: Secondary | ICD-10-CM | POA: Diagnosis not present

## 2024-06-14 DIAGNOSIS — Z992 Dependence on renal dialysis: Secondary | ICD-10-CM

## 2024-06-14 DIAGNOSIS — X58XXXA Exposure to other specified factors, initial encounter: Secondary | ICD-10-CM | POA: Diagnosis not present

## 2024-06-14 DIAGNOSIS — Z905 Acquired absence of kidney: Secondary | ICD-10-CM | POA: Insufficient documentation

## 2024-06-14 DIAGNOSIS — I132 Hypertensive heart and chronic kidney disease with heart failure and with stage 5 chronic kidney disease, or end stage renal disease: Secondary | ICD-10-CM | POA: Insufficient documentation

## 2024-06-14 DIAGNOSIS — Z8673 Personal history of transient ischemic attack (TIA), and cerebral infarction without residual deficits: Secondary | ICD-10-CM | POA: Diagnosis not present

## 2024-06-14 DIAGNOSIS — Z794 Long term (current) use of insulin: Secondary | ICD-10-CM | POA: Insufficient documentation

## 2024-06-14 DIAGNOSIS — Z6834 Body mass index (BMI) 34.0-34.9, adult: Secondary | ICD-10-CM | POA: Insufficient documentation

## 2024-06-14 DIAGNOSIS — N186 End stage renal disease: Secondary | ICD-10-CM | POA: Insufficient documentation

## 2024-06-14 DIAGNOSIS — I509 Heart failure, unspecified: Secondary | ICD-10-CM | POA: Diagnosis not present

## 2024-06-14 DIAGNOSIS — Z86711 Personal history of pulmonary embolism: Secondary | ICD-10-CM | POA: Insufficient documentation

## 2024-06-14 HISTORY — PX: LIGATION OF COMPETING BRANCHES OF ARTERIOVENOUS FISTULA: SHX5949

## 2024-06-14 LAB — GLUCOSE, CAPILLARY
Glucose-Capillary: 342 mg/dL — ABNORMAL HIGH (ref 70–99)
Glucose-Capillary: 124 mg/dL — ABNORMAL HIGH (ref 70–99)
Glucose-Capillary: 127 mg/dL — ABNORMAL HIGH (ref 70–99)
Glucose-Capillary: 320 mg/dL — ABNORMAL HIGH (ref 70–99)
Glucose-Capillary: 35 mg/dL — CL (ref 70–99)
Glucose-Capillary: 30 mg/dL — CL (ref 70–99)
Glucose-Capillary: 31 mg/dL — CL (ref 70–99)
Glucose-Capillary: 66 mg/dL — ABNORMAL LOW (ref 70–99)
Glucose-Capillary: 102 mg/dL — ABNORMAL HIGH (ref 70–99)

## 2024-06-14 LAB — POCT I-STAT, CHEM 8
BUN: 40 mg/dL — ABNORMAL HIGH (ref 6–20)
Calcium, Ion: 0.84 mmol/L — CL (ref 1.15–1.40)
Chloride: 98 mmol/L (ref 98–111)
Creatinine, Ser: 6.6 mg/dL — ABNORMAL HIGH (ref 0.44–1.00)
Glucose, Bld: 333 mg/dL — ABNORMAL HIGH (ref 70–99)
HCT: 33 % — ABNORMAL LOW (ref 36.0–46.0)
Hemoglobin: 11.2 g/dL — ABNORMAL LOW (ref 12.0–15.0)
Potassium: 5.3 mmol/L — ABNORMAL HIGH (ref 3.5–5.1)
Sodium: 129 mmol/L — ABNORMAL LOW (ref 135–145)
TCO2: 24 mmol/L (ref 22–32)

## 2024-06-14 LAB — HCG, SERUM, QUALITATIVE: Preg, Serum: NEGATIVE

## 2024-06-14 SURGERY — LIGATION OF COMPETING BRANCHES OF ARTERIOVENOUS FISTULA
Anesthesia: General | Site: Arm Upper | Laterality: Right

## 2024-06-14 MED ORDER — SODIUM CHLORIDE 0.9 % IV SOLN: INTRAVENOUS | Status: DC

## 2024-06-14 MED ORDER — APIXABAN 5 MG PO TABS: 5.0000 mg | ORAL_TABLET | Freq: Two times a day (BID) | ORAL | Status: AC

## 2024-06-14 MED ORDER — INSULIN ASPART 100 UNIT/ML IJ SOLN
10.0000 [IU] | Freq: Once | INTRAMUSCULAR | Status: AC
  Administered 2024-06-14: 10 [IU] via SUBCUTANEOUS
  Filled 2024-06-14: qty 1

## 2024-06-14 MED ORDER — PHENYLEPHRINE HCL-NACL 20-0.9 MG/250ML-% IV SOLN
INTRAVENOUS | Status: DC | PRN
Start: 2024-06-14 — End: 2024-06-14
  Administered 2024-06-14: 15 ug/min via INTRAVENOUS

## 2024-06-14 MED ORDER — FENTANYL CITRATE (PF) 250 MCG/5ML IJ SOLN
INTRAMUSCULAR | Status: AC
  Filled 2024-06-14: qty 5

## 2024-06-14 MED ORDER — OXYCODONE HCL 5 MG PO TABS
5.0000 mg | ORAL_TABLET | Freq: Four times a day (QID) | ORAL | 0 refills | Status: DC | PRN
  Filled 2024-06-14: qty 4, 1d supply, fill #0

## 2024-06-14 MED ORDER — ONDANSETRON HCL 4 MG/2ML IJ SOLN
INTRAMUSCULAR | Status: DC | PRN
  Administered 2024-06-14: 4 mg via INTRAVENOUS

## 2024-06-14 MED ORDER — MIDAZOLAM HCL 2 MG/2ML IJ SOLN
INTRAMUSCULAR | Status: DC | PRN
  Administered 2024-06-14: 2 mg via INTRAVENOUS

## 2024-06-14 MED ORDER — ORAL CARE MOUTH RINSE: 15.0000 mL | Freq: Once | OROMUCOSAL | Status: AC

## 2024-06-14 MED ORDER — FENTANYL CITRATE (PF) 250 MCG/5ML IJ SOLN
INTRAMUSCULAR | Status: DC | PRN
  Administered 2024-06-14: 50 ug via INTRAVENOUS

## 2024-06-14 MED ORDER — DEXTROSE 50 % IV SOLN
1.0000 | Freq: Once | INTRAVENOUS | Status: AC
  Administered 2024-06-14: 50 mL via INTRAVENOUS

## 2024-06-14 MED ORDER — 0.9 % SODIUM CHLORIDE (POUR BTL) OPTIME
TOPICAL | Status: DC | PRN
  Administered 2024-06-14: 1000 mL

## 2024-06-14 MED ORDER — MIDAZOLAM HCL 2 MG/2ML IJ SOLN
INTRAMUSCULAR | Status: AC
  Filled 2024-06-14: qty 2

## 2024-06-14 MED ORDER — INSULIN ASPART 100 UNIT/ML IJ SOLN: 0.0000 [IU] | INTRAMUSCULAR | Status: DC | PRN

## 2024-06-14 MED ORDER — CEFAZOLIN SODIUM-DEXTROSE 2-4 GM/100ML-% IV SOLN
2.0000 g | INTRAVENOUS | Status: AC
  Administered 2024-06-14: 2 g via INTRAVENOUS
  Filled 2024-06-14: qty 100

## 2024-06-14 MED ORDER — HYDROMORPHONE HCL 1 MG/ML IJ SOLN: 0.2500 mg | INTRAMUSCULAR | Status: DC | PRN

## 2024-06-14 MED ORDER — ACETAMINOPHEN 500 MG PO TABS
1000.0000 mg | ORAL_TABLET | Freq: Once | ORAL | Status: AC
  Administered 2024-06-14: 1000 mg via ORAL
  Filled 2024-06-14: qty 2

## 2024-06-14 MED ORDER — CHLORHEXIDINE GLUCONATE 4 % EX SOLN: 60.0000 mL | Freq: Once | CUTANEOUS | Status: DC

## 2024-06-14 MED ORDER — CHLORHEXIDINE GLUCONATE 0.12 % MT SOLN
15.0000 mL | Freq: Once | OROMUCOSAL | Status: AC
  Administered 2024-06-14: 15 mL via OROMUCOSAL
  Filled 2024-06-14: qty 15

## 2024-06-14 MED ORDER — LIDOCAINE 2% (20 MG/ML) 5 ML SYRINGE
INTRAMUSCULAR | Status: DC | PRN
  Administered 2024-06-14: 100 mg via INTRAVENOUS

## 2024-06-14 MED ORDER — DEXTROSE 50 % IV SOLN
INTRAVENOUS | Status: AC
  Filled 2024-06-14: qty 50

## 2024-06-14 MED ORDER — GLYCOPYRROLATE PF 0.2 MG/ML IJ SOSY
PREFILLED_SYRINGE | INTRAMUSCULAR | Status: DC | PRN
Start: 2024-06-14 — End: 2024-06-14
  Administered 2024-06-14: .1 mg via INTRAVENOUS

## 2024-06-14 MED ORDER — PROPOFOL 10 MG/ML IV BOLUS
INTRAVENOUS | Status: DC | PRN
  Administered 2024-06-14: 100 ug/kg/min via INTRAVENOUS
  Administered 2024-06-14: 130 mg via INTRAVENOUS

## 2024-06-14 MED ORDER — PHENYLEPHRINE 80 MCG/ML (10ML) SYRINGE FOR IV PUSH (FOR BLOOD PRESSURE SUPPORT): PREFILLED_SYRINGE | INTRAVENOUS | Status: DC | PRN

## 2024-06-14 SURGICAL SUPPLY — 23 items
BAG COUNTER SPONGE SURGICOUNT (BAG) ×1 IMPLANT
CANISTER SUCTION 3000ML PPV (SUCTIONS) ×1 IMPLANT
CLIP TI MEDIUM 6 (CLIP) ×1 IMPLANT
CLIP TI WIDE RED SMALL 6 (CLIP) ×1 IMPLANT
COVER PROBE W GEL 5X96 (DRAPES) IMPLANT
DERMABOND ADVANCED .7 DNX12 (GAUZE/BANDAGES/DRESSINGS) ×1 IMPLANT
ELECTRODE REM PT RTRN 9FT ADLT (ELECTROSURGICAL) ×1 IMPLANT
GLOVE BIOGEL PI IND STRL 7.0 (GLOVE) ×1 IMPLANT
GOWN STRL REUS W/ TWL LRG LVL3 (GOWN DISPOSABLE) ×2 IMPLANT
GOWN STRL REUS W/ TWL XL LVL3 (GOWN DISPOSABLE) ×1 IMPLANT
KIT BASIN OR (CUSTOM PROCEDURE TRAY) ×1 IMPLANT
KIT TURNOVER KIT B (KITS) IMPLANT
NS IRRIG 1000ML POUR BTL (IV SOLUTION) ×1 IMPLANT
PACK CV ACCESS (CUSTOM PROCEDURE TRAY) ×1 IMPLANT
PAD ARMBOARD POSITIONER FOAM (MISCELLANEOUS) ×2 IMPLANT
POWDER SURGICEL 3.0 GRAM (HEMOSTASIS) IMPLANT
SUT MNCRL AB 4-0 PS2 18 (SUTURE) ×1 IMPLANT
SUT PROLENE 6 0 BV (SUTURE) IMPLANT
SUT SILK 0 TIES 10X30 (SUTURE) ×1 IMPLANT
SUT VIC AB 3-0 SH 27X BRD (SUTURE) ×1 IMPLANT
TOWEL GREEN STERILE (TOWEL DISPOSABLE) ×1 IMPLANT
UNDERPAD 30X36 HEAVY ABSORB (UNDERPADS AND DIAPERS) ×1 IMPLANT
WATER STERILE IRR 1000ML POUR (IV SOLUTION) ×1 IMPLANT

## 2024-06-14 NOTE — Anesthesia Postprocedure Evaluation (Signed)
 Anesthesia Post Note  Patient: Sherri Dixon  Procedure(s) Performed: LIGATION OF COMPETING BRANCHES OF ARTERIOVENOUS FISTULA (Right: Arm Upper)     Patient location during evaluation: PACU Anesthesia Type: General Level of consciousness: awake and alert Pain management: pain level controlled Vital Signs Assessment: post-procedure vital signs reviewed and stable Respiratory status: spontaneous breathing, nonlabored ventilation, respiratory function stable and patient connected to nasal cannula oxygen Cardiovascular status: blood pressure returned to baseline and stable Postop Assessment: no apparent nausea or vomiting Anesthetic complications: no   No notable events documented.  Last Vitals:  Vitals:   06/14/24 1400 06/14/24 1415  BP: 135/75 (!) 149/77  Pulse: 77 88  Resp: 18 (!) 25  Temp:    SpO2: 100% 98%    Last Pain:  Vitals:   06/14/24 1400  TempSrc:   PainSc: 0-No pain                 Rome Ade

## 2024-06-14 NOTE — Transfer of Care (Signed)
 Immediate Anesthesia Transfer of Care Note  Patient: Sherri Dixon  Procedure(s) Performed: LIGATION OF COMPETING BRANCHES OF ARTERIOVENOUS FISTULA (Right: Arm Upper)  Patient Location: PACU  Anesthesia Type:General  Level of Consciousness: awake, oriented, and drowsy  Airway & Oxygen Therapy: Patient Spontanous Breathing and Patient connected to face mask oxygen  Post-op Assessment: Report given to RN, Post -op Vital signs reviewed and unstable, Anesthesiologist notified, and Patient moving all extremities X 4  Post vital signs: Reviewed and stable  Last Vitals:  Vitals Value Taken Time  BP 118/63 06/14/24 13:45  Temp 36.9 C 06/14/24 13:43  Pulse 78 06/14/24 13:56  Resp 18 06/14/24 13:57  SpO2 100 % 06/14/24 13:56  Vitals shown include unfiled device data.  Last Pain:  Vitals:   06/14/24 1343  TempSrc:   PainSc: Asleep      Patients Stated Pain Goal: 0 (06/14/24 1050)  Complications: No notable events documented.

## 2024-06-14 NOTE — Discharge Instructions (Signed)
 [facility]  Discharge Instructions  AV Fistula or Graft Surgery for Dialysis Access  Please refer to the following instructions for your post-procedure care. Your surgeon or physician assistant will discuss any changes with you.  Activity  You may drive the day following your surgery, if you are comfortable and no longer taking prescription pain medication. Resume full activity as the soreness in your incision resolves.  Bathing/Showering  You may shower after you go home. Keep your incision dry for 48 hours. Do not soak in a bathtub, hot tub, or swim until the incision heals completely. You may not shower if you have a hemodialysis catheter.  Incision Care  Clean your incision with mild soap and water  after 48 hours. Pat the area dry with a clean towel. You do not need a bandage unless otherwise instructed. Do not apply any ointments or creams to your incision. You may have skin glue on your incision. Do not peel it off. It will come off on its own in about one week. Your arm may swell a bit after surgery. To reduce swelling use pillows to elevate your arm so it is above your heart. Your doctor will tell you if you need to lightly wrap your arm with an ACE bandage.  Diet  Resume your normal diet. There are not special food restrictions following this procedure. In order to heal from your surgery, it is CRITICAL to get adequate nutrition. Your body requires vitamins, minerals, and protein. Vegetables are the best source of vitamins and minerals. Vegetables also provide the perfect balance of protein. Processed food has little nutritional value, so try to avoid this.  Medications  Resume taking all of your medications. If your incision is causing pain, you may take over-the counter pain relievers such as acetaminophen  (Tylenol ). If you were prescribed a stronger pain medication, please be aware these medications can cause nausea and constipation. Prevent  nausea by taking the medication with a snack or meal. Avoid constipation by drinking plenty of fluids and eating foods with high amount of fiber, such as fruits, vegetables, and grains.  Do not take Tylenol  if you are taking prescription pain medications.  Follow up Your surgeon may want to see you in the office following your access surgery. If so, this will be arranged at the time of your surgery.  Please call us  immediately for any of the following conditions:  Increased pain, redness, drainage (pus) from your incision site Fever of 101 degrees or higher Severe or worsening pain at your incision site Hand pain or numbness.  Reduce your risk of vascular disease:  Stop smoking. If you would like help, call [facility] at 1-800-QUIT-NOW (937-045-8891) or [place] at 9727301317  Manage your cholesterol Maintain a desired weight Control your diabetes Keep your blood pressure down  Dialysis  It will take several weeks to several months for your new dialysis access to be ready for use. Your surgeon will determine when it is okay to use it. Your nephrologist will continue to direct your dialysis. You can continue to use your Permcath until your new access is ready for use.   06/14/2024 Sherri Dixon [id number] Feb 24, 1981  Surgeon(s): Sherri Norman RAMAN, MD  Procedure(s): LIGATION OF COMPETING BRANCHES OF ARTERIOVENOUS FISTULA   May stick graft immediately   May stick graft on designated area only:   x Do not stick fistula for 5 weeks    If you have any questions, please call the office at [phone].

## 2024-06-14 NOTE — Op Note (Signed)
    OPERATIVE NOTE  PROCEDURE:   Ligation right arm AV fistula branch  PRE-OPERATIVE DIAGNOSIS: Slow maturing AV fistula  POST-OPERATIVE DIAGNOSIS: same as above   SURGEON: Norman GORMAN Serve MD  ASSISTANT(S): Ahmed Holster, PA  Given the complexity of the case,  the assistant was necessary in order to expedient the procedure and safely perform the technical aspects of the operation.  The assistant provided traction and countertraction to assist with exposure of the fistula proximally and distally.  They assisted with ligature of branches.  Their assistance was critical in the performance. These skills, especially following the Prolene suture for the anastomosis, could not have been adequately performed by a scrub tech assistant.  ANESTHESIA: general  ESTIMATED BLOOD LOSS: minimal  FINDING(S): Large branch coming off the proximal portion of the fistula. Improvement in thrill after branch ligation  SPECIMEN(S): None  INDICATIONS:   Sherri Dixon is a 43 y.o. female with ESRD on HD.  She has undergone multiple fistulogram's for slow maturation.  She dialyzes via a right femoral tunneled dialysis catheter.  During the recent fistulogram and tunneled dialysis catheter exchange it was noted that there was a large branch coming off the proximal portion of the fistula and given the slow maturation of the fistula, branch ligation was offered, risks and benefits were reviewed and she elected to proceed.  DESCRIPTION: The patient was brought to the operating room and positioned supine on the operating table.  Anesthesia was induced and the right arm was prepped and draped in the usual sterile fashion.  A timeout was performed and preoperative antibiotics were administered. We started by mapping the right arm AVF with ultrasound.  This appeared to be appropriately dilated throughout the arm although there was a large venous branch coming off laterally a few centimeters from the anastomosis.  A  transverse incision was made overlying this area.  The dissection was carried deeper electrocautery until visualizing the anterior surface of the fistula.  The fistula was dissected for an approximate 1 cm length and the branch was noted and circumferentially dissected.  This was then ligated between 2-0 silk ties and transected.  It was noted that there was improvement of the thrill after branch ligation.  The incision was irrigated and closed with 3-0 Vicryl and 4 Monocryl for the skin.  All counts were correct at the end of procedure and she was transferred to PACU in stable condition.    COMPLICATIONS: None apparent  CONDITION: Stable  Norman GORMAN Serve MD [facility] Phone Number: [phone] 06/14/2024 1:17 PM

## 2024-06-14 NOTE — Progress Notes (Addendum)
 Upon patient's arrival in PACU with report that her previous glucose was 300 and 10 units of insulin  given.  PACU then check glucose 1338 was 35, CRNA proceeded to get dextrose  and call Anesthesia with order to proceed giving dextrose . CRNA wanted a recheck 1339 was 320. Due to the big difference we obtained another glucose monitor and rechecked again 1340 saying 127 and another at 1341 saying 124. Anesthesia ( Dr. Boone) came to bedside, CRNA explained the contradicting of readings and stopped the Dextrose , giving 40mls/ 6.25g of Dextrose  50% at this time.

## 2024-06-14 NOTE — Anesthesia Procedure Notes (Signed)
 Procedure Name: LMA Insertion Date/Time: 06/14/2024 12:51 PM  Performed by: Mollie Olivia SAUNDERS, CRNAPre-anesthesia Checklist: Patient identified, Emergency Drugs available, Suction available and Patient being monitored Patient Re-evaluated:Patient Re-evaluated prior to induction Oxygen Delivery Method: Circle System Utilized Preoxygenation: Pre-oxygenation with 100% oxygen Induction Type: IV induction Ventilation: Mask ventilation without difficulty LMA: LMA inserted LMA Size: 4.0 Laryngoscope Size: 4 Number of attempts: 1 Airway Equipment and Method: Bite block Placement Confirmation: positive ETCO2 Tube secured with: Tape Dental Injury: Teeth and Oropharynx as per pre-operative assessment

## 2024-06-14 NOTE — H&P (Signed)
 Patient seen and examined.  No complaints. No changes to medication history or physical exam since last seen. After discussing the risks and benefits of AVF branch ligation, [name] elected to proceed.    She recently underwent fistulagram which noted a slow maturing AVF with a large branch at the proximal segment near the anastomosis. Plan to ligate any branches identified.  Norman GORMAN Serve MD           Patient name: Sherri Dixon    MRN: [id number]        DOB: Sep 05, 1981            Sex: female   06/01/2024 Pre-operative Diagnosis: ESRD on HD Post-operative diagnosis:  Same Surgeon:  Norman GORMAN Serve, MD Procedure Performed:   [phone], ultrasound-guided access of dialysis circuit with interpretation of fistulogram and central venogram Right femoral tunneled dialysis catheter exchange under fluoroscopic guidance   Indications: Ms. [name] is a 43 year old female with ESRD on HD presenting for right femoral tunneled dialysis catheter exchange.  She has been having issues with TDC malfunction.  She is also had slow maturation of the right arm AV fistula.  Dr. Magda ballooned a juxta anastomotic stenosis on April 27, 2024 although it still been slow to dilate.  Risks and benefits of fistulogram and tunneled dialysis catheter exchange were reviewed and she elected to proceed.   Findings:  Widely patent central venous system.  Widely patent AV fistula.  The juxta anastomotic stenosis has some intraluminal filling defect but appears patent.  There is a large branch a few centimeters from the anastomosis.              Procedure:  The patient was identified in the holding area and taken to the cath lab  The patient was then placed supine on the table and prepped and draped in the usual sterile fashion.  A time out was called.  Ultrasound was used to evaluate the right arm AV access. This was accessed under u/s guidance. An 018 wire was advanced without resistance, a micropuncture sheath was  placed and fistulagram obtained which demonstrated the above findings.  The micropuncture sheath was removed and manual pressure was held and attention was turned to the right groin TDC exchange. The catheter was visualized under fluoroscopy and a glide advantage wire was placed through one of the ports and into the IVC. Using lidocaine  and blunt dissection the cuff was then freed from the surrounding tissue attachments. The catheter was then removed over the glide advantage wire leaving the wire in place in the IVC. The new 44 cm catheter was then tracked over this Glide vantage wire with its stylette in place. This was placed into the atriocaval junction. The stylette and wire were removed. Both ports aspirated and flushed with ease. They were heparin  locked and the catheter was fastened to the skin with a nylon suture.      Contrast: 15 cc Sedation: None   Impression: Successful exchange of right femoral tunnel dialysis catheter under fluoroscopic guidance   The AV fistula appears patent without any flow-limiting stenosis although there is a large branch a few centimeters from the anastomosis.  I offered branch ligation in the OR within the next few weeks and she elected to proceed.     Norman GORMAN Serve MD [facility] [place] Office: [phone]

## 2024-06-14 NOTE — Inpatient Diabetes Management (Signed)
 Inpatient Diabetes Program Recommendations  AACE/ADA: New Consensus Statement on Inpatient Glycemic Control   Target Ranges:  Prepandial:   less than 140 mg/dL      Peak postprandial:   less than 180 mg/dL (1-2 hours)      Critically ill patients:  140 - 180 mg/dL    Review of Glycemic Control Diabetes history: DM2  Outpatient Diabetes medications:  Novolog  8 units TID with meals  Novolog  12 units TID (16 units if CBG is >200mg /dl)   Tresiba  30 units at bedtime   Current orders for Inpatient glycemic control:  Novolog  0-7 units every 2 hours   Inpatient Diabetes Program Recommendations:  Pt seen by Leopold, NP with [facility]. Last visit with Endocrinology was on 03/30/24. Per note on 03/30/24, pt was diagnosed on 04/27/2018 in her 20's (experienced pancreatitis in 2011).   Per note on 03/30/24 with Leopold, NP: She says she was first told she had type 2 diabetes and was started on oral medications, then later told she had type 1 diabetes and was started on insulin . She has only been taking insulin  since approximately 2017. She was hospitalized with mild DKA in 2017. She has a history of pancreatitis in 2011.    If patient is admitted, she will require basal and bolus insulin . Diabetes coordinator will continue to follow.  Thanks,  Lavanda Search, RN, MSN, [facility]  Inpatient Diabetes Coordinator  Pager [phone] (8a-5p)

## 2024-06-14 NOTE — Progress Notes (Signed)
 A helping assist patient in dressing she was slightly more groggy. She had eaten a graham cracker and gingerale. Checked glucose to double check and it was 30, used another monitor to verify and it checked 31 at 1450. Gave Dextrose  50% and some juice. 15 minutes later at 1510 it was 66, and rechecked again 1530 and it was 102. Dr. Pearline Anesthesia said she was good to be  discharged at this point as she has meet all pacu criteria.

## 2024-06-14 NOTE — Progress Notes (Signed)
 Dr. MICAEL Needle made aware patient's CBG= 342. Per protocol consult anesthesiologist prior to administering 6 units. Verbal order received from Dr. Needle to administer 10 units of Novolog .  Dr. Needle also made aware of today's ISTAT result. Ok per Dr. Needle. No new orders received.

## 2024-06-15 ENCOUNTER — Other Ambulatory Visit: Payer: Self-pay

## 2024-06-15 ENCOUNTER — Encounter (HOSPITAL_COMMUNITY): Payer: Self-pay | Admitting: Vascular Surgery

## 2024-06-15 DIAGNOSIS — N186 End stage renal disease: Secondary | ICD-10-CM

## 2024-06-28 ENCOUNTER — Encounter

## 2024-06-28 ENCOUNTER — Encounter (HOSPITAL_COMMUNITY)

## 2024-07-15 ENCOUNTER — Encounter: Admitting: Vascular Surgery

## 2024-07-15 ENCOUNTER — Encounter (HOSPITAL_COMMUNITY)

## 2024-07-20 ENCOUNTER — Other Ambulatory Visit: Payer: Self-pay | Admitting: Vascular Surgery

## 2024-07-20 ENCOUNTER — Ambulatory Visit (HOSPITAL_COMMUNITY)
Admission: RE | Admit: 2024-07-20 | Discharge: 2024-07-20 | Disposition: A | Discharge status: Home / Self Care | Source: Ambulatory Visit | Attending: Vascular Surgery | Admitting: Vascular Surgery

## 2024-07-20 ENCOUNTER — Encounter (HOSPITAL_COMMUNITY)
Admission: RE | Disposition: A | Discharge status: Home / Self Care | Payer: Self-pay | Source: Ambulatory Visit | Attending: Vascular Surgery

## 2024-07-20 ENCOUNTER — Other Ambulatory Visit: Payer: Self-pay

## 2024-07-20 DIAGNOSIS — Z992 Dependence on renal dialysis: Secondary | ICD-10-CM | POA: Insufficient documentation

## 2024-07-20 DIAGNOSIS — N186 End stage renal disease: Secondary | ICD-10-CM | POA: Insufficient documentation

## 2024-07-20 DIAGNOSIS — Y839 Surgical procedure, unspecified as the cause of abnormal reaction of the patient, or of later complication, without mention of misadventure at the time of the procedure: Secondary | ICD-10-CM | POA: Insufficient documentation

## 2024-07-20 DIAGNOSIS — I120 Hypertensive chronic kidney disease with stage 5 chronic kidney disease or end stage renal disease: Secondary | ICD-10-CM | POA: Insufficient documentation

## 2024-07-20 DIAGNOSIS — T8249XA Other complication of vascular dialysis catheter, initial encounter: Secondary | ICD-10-CM | POA: Diagnosis not present

## 2024-07-20 DIAGNOSIS — E1122 Type 2 diabetes mellitus with diabetic chronic kidney disease: Secondary | ICD-10-CM | POA: Insufficient documentation

## 2024-07-20 DIAGNOSIS — T8241XA Breakdown (mechanical) of vascular dialysis catheter, initial encounter: Secondary | ICD-10-CM | POA: Diagnosis present

## 2024-07-20 HISTORY — PX: TUNNELLED CATHETER EXCHANGE: CATH118373

## 2024-07-20 LAB — GLUCOSE, CAPILLARY: Glucose-Capillary: 399 mg/dL — ABNORMAL HIGH (ref 70–99)

## 2024-07-20 SURGERY — TUNNELLED CATHETER EXCHANGE
Anesthesia: LOCAL

## 2024-07-20 MED ORDER — IODIXANOL 320 MG/ML IV SOLN
INTRAVENOUS | Status: DC | PRN
  Administered 2024-07-20: 10 mL via INTRAVENOUS

## 2024-07-20 MED ORDER — HEPARIN SODIUM (PORCINE) 1000 UNIT/ML IJ SOLN
INTRAMUSCULAR | Status: DC | PRN
  Administered 2024-07-20: 5200 [IU] via INTRAVENOUS

## 2024-07-20 MED ORDER — LIDOCAINE HCL (PF) 1 % IJ SOLN
INTRAMUSCULAR | Status: AC
  Filled 2024-07-20: qty 30

## 2024-07-20 MED ORDER — LIDOCAINE HCL (PF) 1 % IJ SOLN
INTRAMUSCULAR | Status: DC | PRN
  Administered 2024-07-20: 15 mL via INTRADERMAL

## 2024-07-20 MED ORDER — DOXYCYCLINE HYCLATE 100 MG PO CAPS: 100.0000 mg | ORAL_CAPSULE | Freq: Two times a day (BID) | ORAL | 0 refills | Status: AC

## 2024-07-20 MED ORDER — HEPARIN SODIUM (PORCINE) 1000 UNIT/ML IJ SOLN
INTRAMUSCULAR | Status: AC
  Filled 2024-07-20: qty 10

## 2024-07-20 MED ORDER — HEPARIN (PORCINE) IN NACL 1000-0.9 UT/500ML-% IV SOLN
INTRAVENOUS | Status: DC | PRN
  Administered 2024-07-20: 500 mL

## 2024-07-20 SURGICAL SUPPLY — 4 items
GLIDEWIRE STIFF .35X180X3 HYDR (WIRE) IMPLANT
KIT CATH CHRNC PALINDROME 14.5 (CATHETERS) IMPLANT
KIT PV (KITS) ×1 IMPLANT
TRAY PV CATH (CUSTOM PROCEDURE TRAY) ×1 IMPLANT

## 2024-07-20 NOTE — H&P (Signed)
 [facility] [place]  ASSESSMENT / PLAN: 43 y.o. female with end-stage renal disease on dialysis via right femoral tunneled dialysis catheter.  The catheter is not working well.  Plan exchange in the Cath Lab today.  CHIEF COMPLAINT: End-stage renal disease  HISTORY OF PRESENT ILLNESS: Sherri Dixon is a 43 y.o. female referred to the dialysis access center for evaluation and treatment of malfunctioning tunneled dialysis catheter.  Catheter has been exchanged multiple times.  CathFlo infusion was not effective at troubleshooting the catheter.  Past Medical History:  Diagnosis Date   Anemia    Asthma    as a child   CHF (congestive heart failure) (HCC)    Pt reports that father has CHF, not her   Chronic kidney disease    Diabetes mellitus without complication (HCC)    Dyslipidemia    GERD (gastroesophageal reflux disease)    Hypertension    Pulmonary embolism (HCC)    Stroke [facility])     Past Surgical History:  Procedure Laterality Date   A/V FISTULAGRAM N/A 06/01/2024   Procedure: A/V Fistulagram;  Surgeon: Pearline Norman RAMAN, MD;  Location: [facility];  Service: Cardiovascular;  Laterality: N/A;   A/V SHUNT INTERVENTION Right 03/28/2024   Procedure: A/V SHUNT INTERVENTION;  Surgeon: Magda Debby SAILOR, MD;  Location: [facility];  Service: Cardiovascular;  Laterality: Right;   APPLICATION OF WOUND VAC Right 04/12/2021   Procedure: APPLICATION OF WOUND VAC;  Surgeon: Elsa Lonni SAUNDERS, MD;  Location: [facility] OR;  Service: Orthopedics;  Laterality: Right;   AV FISTULA PLACEMENT Right 01/20/2024   Procedure: RIGHT ARM ARTERIOVENOUS (AV) BRACHIOCEPHALIC FISTULA CREATION;  Surgeon: Pearline Norman RAMAN, MD;  Location: [facility] OR;  Service: Vascular;  Laterality: Right;   DIALYSIS/PERMA CATHETER INSERTION N/A 10/02/2023   Procedure: DIALYSIS/PERMA CATHETER INSERTION;  Surgeon: Pearline Norman RAMAN, MD;  Location: [facility] INVASIVE CV LAB;  Service: Cardiovascular;  Laterality: N/A;    DIALYSIS/PERMA CATHETER INSERTION N/A 02/29/2024   Procedure: DIALYSIS/PERMA CATHETER INSERTION;  Surgeon: Pearline Norman RAMAN, MD;  Location: [facility] INVASIVE CV LAB;  Service: Cardiovascular;  Laterality: N/A;   I & D EXTREMITY Right 04/06/2021   Procedure: IRRIGATION AND DEBRIDEMENT TOE WITH WOUND VAC PLACEMENT;  Surgeon: Elsa Lonni SAUNDERS, MD;  Location: [facility] OR;  Service: Orthopedics;  Laterality: Right;   I & D EXTREMITY Right 04/12/2021   Procedure: IRRIGATION AND DEBRIDEMENT RIGHT FOOT;  Surgeon: Elsa Lonni SAUNDERS, MD;  Location: [facility] OR;  Service: Orthopedics;  Laterality: Right;   INCISION AND DRAINAGE OF WOUND Right 05/03/2024   Procedure: RIGHT FOOT INCISION AND DRAINAGE OF DEEP FOOT ABSCESS DEBRIDEMENT OF CHRONIC ULCERATION, ADJACENT SOFT TISSUE TRANSFER;  Surgeon: Elsa Lonni SAUNDERS, MD;  Location: [facility] ORS;  Service: Orthopedics;  Laterality: Right;   IR FLUORO GUIDE CV LINE RIGHT  10/01/2023   IR FLUORO GUIDE CV LINE RIGHT  10/01/2023   IR FLUORO GUIDE CV LINE RIGHT  10/02/2023   IR US  GUIDE VASC ACCESS RIGHT  10/01/2023   LIGATION OF COMPETING BRANCHES OF ARTERIOVENOUS FISTULA Right 06/14/2024   Procedure: LIGATION OF COMPETING BRANCHES OF ARTERIOVENOUS FISTULA;  Surgeon: Pearline Norman RAMAN, MD;  Location: [facility] OR;  Service: Vascular;  Laterality: Right;   MOUTH SURGERY     ROBOTIC ASSITED PARTIAL NEPHRECTOMY Right 07/28/2019   Procedure: XI ROBOTIC ASSITED PARTIAL NEPHRECTOMY;  Surgeon: Sherrilee Belvie CROME, MD;  Location: [facility] ORS;  Service: Urology;  Laterality: Right;  3 HRS   SECONDARY CLOSURE OF WOUND Right  06/18/2021   Procedure: SECONDARY CLOSURE OF WOUND tRANSMETATARSAL AMPUTAION;  Surgeon: Elsa Lonni SAUNDERS, MD;  Location: [facility] OR;  Service: Orthopedics;  Laterality: Right;   TEMPORARY DIALYSIS CATHETER N/A 02/29/2024   Procedure: TEMPORARY DIALYSIS CATHETER;  Surgeon: Pearline Norman RAMAN, MD;  Location: [facility] INVASIVE CV LAB;  Service: Cardiovascular;  Laterality: N/A;   TRANSMETATARSAL AMPUTATION Right  04/12/2021   Procedure: TRANSMETATARSAL AMPUTATION;  Surgeon: Elsa Lonni SAUNDERS, MD;  Location: [facility] OR;  Service: Orthopedics;  Laterality: Right;   TUNNELLED CATHETER EXCHANGE N/A 01/05/2024   Procedure: TUNNELLED CATHETER EXCHANGE;  Surgeon: Tobie Gordy POUR, MD;  Location: [facility] INVASIVE CV LAB;  Service: Cardiovascular;  Laterality: N/A;   TUNNELLED CATHETER EXCHANGE N/A 06/01/2024   Procedure: TUNNELLED CATHETER EXCHANGE;  Surgeon: Pearline Norman RAMAN, MD;  Location: [facility];  Service: Cardiovascular;  Laterality: N/A;   VENOUS ANGIOPLASTY  03/28/2024   Procedure: VENOUS ANGIOPLASTY;  Surgeon: Magda Debby SAILOR, MD;  Location: [facility];  Service: Cardiovascular;;  Cephalic Vein    Family History  Problem Relation Age of Onset   High blood pressure Mother    Diabetes Mother    Arthritis Mother    Hyperlipidemia Mother    Heart disease Mother    High blood pressure Father    Hyperlipidemia Father    Dementia Father    High blood pressure Sister    Diabetes Brother    Hyperlipidemia Brother    High blood pressure Brother    Colon cancer Neg Hx    Stomach cancer Neg Hx    Esophageal cancer Neg Hx    Pancreatic cancer Neg Hx    Liver disease Neg Hx     Social History   Socioeconomic History   Marital status: Married    Spouse name: Not on file   Number of children: Not on file   Years of education: Not on file   Highest education level: Not on file  Occupational History   Not on file  Tobacco Use   Smoking status: Never   Smokeless tobacco: Never  Vaping Use   Vaping status: Never Used  Substance and Sexual Activity   Alcohol  use: Not Currently    Alcohol /week: 1.0 standard drink of alcohol     Types: 1 Glasses of wine per week    Comment: occas   Drug use: No   Sexual activity: Yes    Birth control/protection: None  Other Topics Concern   Not on file  Social History Narrative   Not on file   Social Drivers of Health   Financial Resource Strain: Low Risk  (10/28/2023)    Received from [facility]   Overall Financial Resource Strain (CARDIA)    Difficulty of Paying Living Expenses: Not hard at all  Food Insecurity: No Food Insecurity (10/28/2023)   Received from [facility]   Hunger Vital Sign    Within the past 12 months, you worried that your food would run out before you got the money to buy more.: Never true    Within the past 12 months, the food you bought just didn't last and you didn't have money to get more.: Never true  Transportation Needs: No Transportation Needs (10/28/2023)   Received from [facility] - Transportation    Lack of Transportation (Medical): No    Lack of Transportation (Non-Medical): No  Physical Activity: Unknown (10/28/2023)   Received from [facility]   Exercise Vital Sign    On average, how  many days per week do you engage in moderate to strenuous exercise (like a brisk walk)?: 0 days    Minutes of Exercise per Session: Not on file  Stress: Stress Concern Present (10/28/2023)   Received from [facility] - Occupational Stress Questionnaire    Feeling of Stress : To some extent  Social Connections: Socially Integrated (10/28/2023)   Received from [facility]   Social Network    How would you rate your social network (family, work, friends)?: Good participation with social networks  Intimate Partner Violence: Not At Risk (10/28/2023)   Received from [facility]   HITS    Over the last 12 months how often did your partner physically hurt you?: Never    Over the last 12 months how often did your partner insult you or talk down to you?: Never    Over the last 12 months how often did your partner threaten you with physical harm?: Never    Over the last 12 months how often did your partner scream or curse at you?: Never    Allergies  Allergen Reactions   Amlodipine Swelling    Pelvis swells   Humalog  Kwikpen [Insulin  Lispro] Other (See Comments)     75/50---swelling and ulcers in mouth     No current facility-administered medications for this encounter.    PHYSICAL EXAM Vitals:   07/20/24 0911 07/20/24 0925  BP: (!) 210/97 (!) 210/97  Pulse: 86 78  Resp: 12 16  Temp: 98.8 F (37.1 C)   TempSrc: Oral   SpO2: 100% 100%   Middle-age woman in no distress Regular rate and rhythm Labored breathing Right femoral TDC in position  PERTINENT LABORATORY AND RADIOLOGIC DATA  Most recent CBC    Latest Ref Rng & Units 06/14/2024   10:59 AM 05/03/2024    9:14 AM 02/29/2024   11:58 AM  CBC  Hemoglobin 12.0 - 15.0 g/dL 88.7  87.0  8.8   Hematocrit 36.0 - 46.0 % 33.0  38.0  26.0      Most recent CMP    Latest Ref Rng & Units 06/14/2024   10:59 AM 05/03/2024    9:14 AM 02/29/2024   11:58 AM  CMP  Glucose 70 - 99 mg/dL 666  79  896   BUN 6 - 20 mg/dL 40  19  35   Creatinine 0.44 - 1.00 mg/dL 3.39  5.59  1.69   Sodium 135 - 145 mmol/L 129  138  134   Potassium 3.5 - 5.1 mmol/L 5.3  3.7  4.2   Chloride 98 - 111 mmol/L 98  96  100     Renal function CrCl cannot be calculated (Patient's most recent lab result is older than the maximum 21 days allowed.).  Hgb A1c MFr Bld (%)  Date Value  09/28/2023 6.1 (H)    LDL Cholesterol  Date Value Ref Range Status  01/26/2021 281 (H) 0 - 99 mg/dL Final    Comment:           Total Cholesterol/HDL:CHD Risk Coronary Heart Disease Risk Table                     Men   Women  1/2 Average Risk   3.4   3.3  Average Risk       5.0   4.4  2 X Average Risk   9.6   7.1  3 X Average Risk  23.4  11.0        Use the calculated Patient Ratio above and the CHD Risk Table to determine the patient's CHD Risk.        ATP III CLASSIFICATION (LDL):  <100     mg/dL   Optimal  899-870  mg/dL   Near or Above                    Optimal  130-159  mg/dL   Borderline  839-810  mg/dL   High  >809     mg/dL   Very High Performed at [facility], [address], [place], [place] [address]      Debby SAILOR. Magda, MD FACS [facility] Phone Number: [phone] 07/20/2024 9:55 AM   Total time spent on preparing this encounter including chart review, data review, collecting history, examining the patient, and coordinating care: 30 minutes.   Portions of this report may have been transcribed using voice recognition software.  Every effort has been made to ensure accuracy; however, inadvertent computerized transcription errors may still be present.

## 2024-07-20 NOTE — Op Note (Signed)
 DATE OF SERVICE: 07/20/2024  PATIENT:  Sherri Dixon  43 y.o. female  PRE-OPERATIVE DIAGNOSIS:  end-stage renal disease; malfunction tdc  POST-OPERATIVE DIAGNOSIS:  Same  PROCEDURE:   1) exchange of tunneled dialysis catheter (CPT [phone]) 2) central venogram (CPT [phone]) 3) established outpatient evaluation and management - level 3 (CPT 99213)  SURGEON:  Debby SAILOR. Magda, MD  ASSISTANT: none  ANESTHESIA:   local  ESTIMATED BLOOD LOSS: min  LOCAL MEDICATIONS USED:  LIDOCAINE    COUNTS: confirmed correct.  PATIENT DISPOSITION:  PACU - hemodynamically stable.   Delay start of Pharmacological VTE agent (>24hrs) due to surgical blood loss or risk of bleeding: no  INDICATION FOR PROCEDURE: Sherri Dixon is a 43 y.o. female with ESRD on HD via RCFV TDC. The catheter is not working well. After careful discussion of risks, benefits, and alternatives the patient was offered [facility] exchange. The patient understood and wished to proceed.  OPERATIVE FINDINGS:  Successful exchange of 44cm RCFV TDC Venogram shows fibrin sheath proximally about the old catheter. The catheter resting point is in widely patent perihepatic IVC.  DESCRIPTION OF PROCEDURE: After identification of the patient in the pre-operative holding area, the patient was transferred to the operating room. The patient was positioned supine on the operating room table.  The right groin was prepped and draped in standard fashion. A surgical pause was performed confirming correct patient, procedure, and operative location.  Generous infiltration of 1% lidocaine  was made around the catheter exit site.  The catheter was freed from the subcutaneous attachments using the cuff for a, once the cuff was released, the catheter was withdrawn into the common iliac vein and a central venogram performed.  See above for details.  A wire was advanced into the superior vena cava.  Over the wire the catheter was exchanged for a new 44 cm tunneled dialysis  catheter.  The catheter was tested and found to function well.  The catheter was sutured in to the skin using 2 interrupted 2-0 nylon sutures.  Upon completion of the case instrument and sharps counts were confirmed correct. The patient was transferred to the  PACU in good condition. I was present for all portions of the procedure.  PLAN: OK to use TDC. Follow up with Dr. Pearline as scheduled.   Debby SAILOR. Magda, MD [facility] Phone Number: [phone] 07/20/2024 11:29 AM

## 2024-07-21 ENCOUNTER — Ambulatory Visit: Attending: Vascular Surgery | Admitting: Physician Assistant

## 2024-07-21 ENCOUNTER — Ambulatory Visit (HOSPITAL_COMMUNITY)
Admission: RE | Admit: 2024-07-21 | Discharge: 2024-07-21 | Disposition: A | Discharge status: Home / Self Care | Source: Ambulatory Visit | Attending: Vascular Surgery | Admitting: Vascular Surgery

## 2024-07-21 ENCOUNTER — Encounter (HOSPITAL_COMMUNITY): Payer: Self-pay | Admitting: Vascular Surgery

## 2024-07-21 VITALS — BP 153/86 | HR 82 | Temp 97.7°F | Wt 216.3 lb

## 2024-07-21 DIAGNOSIS — N186 End stage renal disease: Secondary | ICD-10-CM | POA: Diagnosis present

## 2024-07-21 NOTE — Progress Notes (Signed)
 POST OPERATIVE OFFICE NOTE    CC:  F/u for surgery  HPI:  Sherri Dixon is a 43 y.o. female who is here for postop visit.  She recently underwent branch ligation of a right brachiocephalic AV fistula on 06/14/2024 by Dr. Pearline.  This was done for a slow to mature fistula after right upper extremity fistulogram with peripheral angioplasty.  She also just had an exchange of her right common femoral TDC yesterday by Dr. Magda.  Pt returns today for follow up.  She has no complaints at today's office visit.  She denies any issues with her right arm incision such as drainage or dehiscence.  She denies any symptoms of steal in the right hand such as weakness, numbness, excessive coldness, or pain.  She dialyzes on Mondays, Wednesdays, and Fridays via right common femoral TDC at [facility] on [address].   Allergies  Allergen Reactions   Amlodipine Swelling    Pelvis swells   Humalog  Kwikpen [Insulin  Lispro] Other (See Comments)    75/50---swelling and ulcers in mouth     Current Outpatient Medications  Medication Sig Dispense Refill   doxycycline  (VIBRAMYCIN ) 100 MG capsule Take 1 capsule (100 mg total) by mouth 2 (two) times daily. 28 capsule 0   apixaban  (ELIQUIS ) 5 MG TABS tablet Take 1 tablet (5 mg total) by mouth 2 (two) times daily.     Blood Glucose Monitoring Suppl (BLOOD GLUCOSE MONITOR SYSTEM) w/Device KIT Use as directed in the morning, at noon, and at bedtime. 1 kit 0   cetirizine (ZYRTEC) 10 MG tablet Take 10 mg by mouth daily.     Continuous Blood Gluc Sensor (DEXCOM G6 SENSOR) MISC by Does not apply route.     gabapentin  (NEURONTIN ) 100 MG capsule Take 100 mg by mouth 2 (two) times daily as needed (nerve pain).     hydrALAZINE  (APRESOLINE ) 50 MG tablet Take 1 tablet by mouth in the morning and at bedtime.     insulin  aspart (NOVOLOG ) 100 UNIT/ML FlexPen Use as directed before each meal 3 times a day, 140-199 - 2 units, 200-250 - 4 units, 251-299 - 6 units,  300-349 - 8  units,  350 or above 10 units. (Patient taking differently: Inject 8-16 Units into the skin 3 (three) times daily with meals.) 3 mL 0   Insulin  Pen Needle 32G X 4 MM MISC Use as directed 4 times a day for subcutaneous insulin  injection. 100 each 0   nitroGLYCERIN  (NITROSTAT ) 0.4 MG SL tablet Take 0.4 mg by mouth every 5 (five) minutes as needed for chest pain.     omeprazole  (PRILOSEC) 20 MG capsule TAKE 1 CAPSULE (20 MG TOTAL) BY MOUTH 2 (TWO) TIMES DAILY BEFORE A MEAL. (Patient taking differently: Take 20 mg by mouth daily as needed (acid reflux or indigestion).) 180 capsule 1   oxyCODONE  (ROXICODONE ) 5 MG immediate release tablet Take 1 tablet (5 mg total) by mouth every 6 (six) hours as needed for severe pain (pain score 7-10). 4 tablet 0   rosuvastatin  (CRESTOR ) 10 MG tablet Take 1 tablet (10 mg total) by mouth daily. 30 tablet 11   TRESIBA  FLEXTOUCH 100 UNIT/ML FlexTouch Pen Inject 36 Units into the skin at bedtime.     No current facility-administered medications for this visit.     ROS:  See HPI  Physical Exam:  Incision: Right arm incision well-healed without signs of infection  Extremities:  palpable right radial pulse. Right brachiocephalic fistula with a great thrill, slightly pulsatile Neuro:  intact motor and sensation of RUE  Studies: Right Arm Dialysis Duplex (07/21/2024) +--------------------+----------+-----------------+--------+  AVF                PSV (cm/s)Flow Vol (mL/min)Comments  +--------------------+----------+-----------------+--------+  Native artery inflow   196          1003                 +--------------------+----------+-----------------+--------+  AVF Anastomosis        771                               +--------------------+----------+-----------------+--------+     +------------+----------+-------------+----------+-------------------------  ---+  OUTFLOW VEINPSV (cm/s)Diameter (cm)Depth (cm)          Describe              +------------+----------+-------------+----------+-------------------------  ---+  Prox UA        306        0.57        0.64   retained valve vs  stranding.  +------------+----------+-------------+----------+-------------------------  ---+  Mid UA          76        0.81        0.60                                  +------------+----------+-------------+----------+-------------------------  ---+  Dist UA        109        0.75        0.29          Retained valve          +------------+----------+-------------+----------+-------------------------  ---+  AC Fossa       931        0.29        0.60                                  +------------+----------+-------------+----------+-------------------------     Assessment/Plan:  This is a 43 y.o. female who is here for a post op visit  - The patient recently underwent branch ligation of a right brachiocephalic AV fistula last month for a slow to mature fistula.  Her right arm incision is well-healed -Duplex demonstrates a matured fistula with good flow volume of 1003 mL/min.  Diameters of the fistula are larger than 6 mm throughout most of the arm. - She denies any symptoms of steal such as right hand weakness, numbness, excessive coldness, or pain.  She has a palpable right radial pulse.  She has intact motor and sensation of the right hand. - Her right brachiocephalic fistula has a great thrill on palpation.  Her fistula can be easily identified.  - I believe that her fistula is ready for use now.  They can try to access this tomorrow at dialysis, on 10/24.  If her fistula works well for at least 3 dialysis sessions, she can call our office for [facility] removal -She can follow up with our office as needed   Ahmed Holster, PA-C [facility] [phone]   Clinic MD:  [place]

## 2024-08-03 ENCOUNTER — Other Ambulatory Visit: Payer: Self-pay | Admitting: Orthopaedic Surgery

## 2024-08-03 ENCOUNTER — Other Ambulatory Visit: Payer: Self-pay

## 2024-08-03 ENCOUNTER — Encounter (HOSPITAL_COMMUNITY): Payer: Self-pay | Admitting: Orthopaedic Surgery

## 2024-08-03 NOTE — Anesthesia Preprocedure Evaluation (Addendum)
 Anesthesia Evaluation  Patient identified by MRN, date of birth, ID band Patient awake    Reviewed: Allergy & Precautions, NPO status , Patient's Chart, lab work & pertinent test results  History of Anesthesia Complications Negative for: history of anesthetic complications  Airway Mallampati: III  TM Distance: >3 FB Neck ROM: Full    Dental  (+) Dental Advisory Given   Pulmonary neg shortness of breath, asthma (childhood) , neg sleep apnea, neg COPD, neg recent URI, PE   Pulmonary exam normal breath sounds clear to auscultation       Cardiovascular hypertension (hydralazine ), Pt. on medications (-) angina +CHF  (-) Past MI, (-) Cardiac Stents and (-) CABG + dysrhythmias (prolonged QT)  Rhythm:Regular Rate:Normal  HLD, h/o pericardial effusion  TTE 10/06/2023: IMPRESSIONS    1. Left ventricular ejection fraction, by estimation, is 60 to 65%. The  left ventricle has normal function. The left ventricle has no regional  wall motion abnormalities. There is mild left ventricular hypertrophy.   2. Right ventricular systolic function is normal. The right ventricular  size is normal.   3. Left atrial size was mildly dilated.   4. Effusion is slightly large than TTE done 09/28/23 IVC is not dilated  and collpases with inspiration with no RA/RV diastolic collapse no  tamponade Now measures 2.4 cm lateral to LV posterior wall . moderate to  large. The pericardial effusion is  circumferential.   5. The mitral valve was not assessed. No evidence of mitral valve  regurgitation. No evidence of mitral stenosis.   6. The aortic valve was not assessed. Aortic valve regurgitation is not  visualized. No aortic stenosis is present.   7. The inferior vena cava is normal in size with greater than 50%  respiratory variability, suggesting right atrial pressure of 3 mmHg.     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; last HD yesterday)     Musculoskeletal   Abdominal  (+) + obese  Peds  Hematology  (+) Blood dyscrasia, anemia Lab Results      Component                Value               Date                      WBC                      9.6                 12/29/2023                HGB                      11.2 (L)            06/14/2024                HCT                      33.0 (L)            06/14/2024                MCV  86.6                12/29/2023                PLT                      212                 12/29/2023              Anesthesia Other Findings Last Eliquis : 2 days ago  Reproductive/Obstetrics                              Anesthesia Physical Anesthesia Plan  ASA: 4  Anesthesia Plan: General   Post-op Pain Management: Tylenol  PO (pre-op )*   Induction: Intravenous  PONV Risk Score and Plan: 3 and Ondansetron , Dexamethasone , Midazolam  and Treatment may vary due to age or medical condition  Airway Management Planned: LMA  Additional Equipment:   Intra-op Plan:   Post-operative Plan: Extubation in OR  Informed Consent: I have reviewed the patients History and Physical, chart, labs and discussed the procedure including the risks, benefits and alternatives for the proposed anesthesia with the patient or authorized representative who has indicated his/her understanding and acceptance.     Dental advisory given  Plan Discussed with: CRNA and Anesthesiologist  Anesthesia Plan Comments: (PAT note written 08/03/2024 by Allison Zelenak, PA-C.  Risks of general anesthesia discussed including, but not limited to, sore throat, hoarse voice, chipped/damaged teeth, injury to vocal cords, nausea and vomiting, allergic reactions, lung  infection, heart attack, stroke, and death. All questions answered.   )         Anesthesia Quick Evaluation

## 2024-08-03 NOTE — Progress Notes (Signed)
 PCP - Lyle DELENA Setters, NP  Chest x-ray - 10/12/23 EKG - 12/29/23 Stress Test - 04/25/19 ECHO - 10/06/23  Fasting Blood Sugar - 200-300's Checks Blood Sugar 3-4/day  Blood Thinner Instructions: LD Eliquis  11/3  Anesthesia review: Y  Patient verbally denies any shortness of breath, fever, cough and chest pain during phone call   -------------  SDW INSTRUCTIONS given:  Your procedure is scheduled on Thurs, Nov 6th.  Report to [facility] Main Entrance A at 0730 A.M., and check in at the Admitting office.  Call this number if you have problems the morning of surgery:  [phone]   Remember:  Do not eat after midnight the night before your surgery  You may drink clear liquids until 0700 the morning of your surgery.   Clear liquids allowed are: Water , Non-Citrus Juices (without pulp), Carbonated Beverages, Clear Tea, Black Coffee Only, and Gatorade    Take these medicines the morning of surgery with A SIP OF WATER   hydrALAZINE  (APRESOLINE )  rosuvastatin  (CRESTOR )  gabapentin  (NEURONTIN )-if needed omeprazole  (PRILOSEC)-if needed  **TRESIBA ** (Type 2) Take 1/2 of your normal bedtime dose  **NOVOLOG ** Only take 1/2 of normal correction dose if blood sugar is above 220  As of today, STOP taking any Aspirin  (unless otherwise instructed by your surgeon) Aleve, Naproxen, Ibuprofen , Motrin , Advil , Goody's, BC's, all herbal medications, fish oil, and all vitamins.  ** PLEASE check your blood sugar the morning of your surgery when you wake up and every 2 hours until you get to the Short Stay unit.  If your blood sugar is less than 70 mg/dL, you will need to treat for low blood sugar: Do not take insulin . Treat a low blood sugar (less than 70 mg/dL) with  cup of clear juice (cranberry or apple), 4 glucose tablets, OR glucose gel. Recheck blood sugar in 15 minutes after treatment (to make sure it is greater than 70 mg/dL). If your blood sugar is not greater than 70 mg/dL on  recheck, call [phone] for further instructions.                       Do not wear jewelry, make up, or nail polish            Do not wear lotions, powders, perfumes/colognes, or deodorant.            Do not shave 48 hours prior to surgery.  Men may shave face and neck.            Do not bring valuables to the hospital.            [facility] is not responsible for any belongings or valuables.  Do NOT Smoke (Tobacco/Vaping) 24 hours prior to your procedure If you use a CPAP at night, you may bring all equipment for your overnight stay.   Contacts, glasses, dentures or bridgework may not be worn into surgery.      For patients admitted to the hospital, discharge time will be determined by your treatment team.   Patients discharged the day of surgery will not be allowed to drive home, and someone needs to stay with them for 24 hours.    Special instructions:   [place]- Preparing For Surgery  Before surgery, you can play an important role. Because skin is not sterile, your skin needs to be as free of germs as possible. You can reduce the number of germs on your skin by washing with CHG (chlorahexidine gluconate) Soap  before surgery.  CHG is an antiseptic cleaner which kills germs and bonds with the skin to continue killing germs even after washing.    Oral Hygiene is also important to reduce your risk of infection.  Remember - BRUSH YOUR TEETH THE MORNING OF SURGERY WITH YOUR REGULAR TOOTHPASTE  Please do not use if you have an allergy to CHG or antibacterial soaps. If your skin becomes reddened/irritated stop using the CHG.  Do not shave (including legs and underarms) for at least 48 hours prior to first CHG shower. It is OK to shave your face.  Please follow these instructions carefully.   Shower the NIGHT BEFORE SURGERY and the MORNING OF SURGERY with DIAL Soap.   Pat yourself dry with a CLEAN TOWEL.  Wear CLEAN PAJAMAS to bed the night before surgery  Place CLEAN SHEETS  on your bed the night of your first shower and DO NOT SLEEP WITH PETS.   Day of Surgery: Please shower morning of surgery  Wear Clean/Comfortable clothing the morning of surgery Do not apply any deodorants/lotions.   Remember to brush your teeth WITH YOUR REGULAR TOOTHPASTE.   Questions were answered. Patient verbalized understanding of instructions.

## 2024-08-03 NOTE — Progress Notes (Signed)
 Anesthesia Chart Review: Sherri Dixon  Case: [id number] Date/Time: 08/04/24 0945   Procedure: INCISION AND DRAINAGE OF DEEP ABSCESS, KNEE (Right)   Anesthesia type: Choice   Diagnosis: Abscess of right lower leg [L02.415]   Pre-op  diagnosis: CUTANEOUS ABSCESS OF RIGHT LOWER LIMB   [facility] ROOM 05 / [facility] OR   Surgeons: Elsa Lonni SAUNDERS, MD       DISCUSSION: Patient is a 43 year old female scheduled for the above procedure. S/p right TMA 04/12/2021 and right foot I&D abscess with local soft tissue arrangement via Z-plasty for wound closure 05/03/2024. At 07/13/2024 orthopedic follow-up there was concern for right medial lower leg abscess. RLE venous US  negative for DVT. She was started on doxycycyline with one week follow-up. Now scheduled for the above surgery. She is a hemodialysis patient.   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   S/p right femoral TDC exchange 06/01/2024, s/p creation right brachiocephalic AVF 01/20/2024 with branch ligation 06/14/2024.  She had VVS follow-up on 07/21/2024 and AVF felt ready for access. If not issues after use for at least 3 HD sessions, then will be considered for right femoral TDC removal.   Last cardiology visit noted with Dr. Zhao was on 07/07/2024. 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 Admission in January 2025 for progressive CKD with volume overload,  anasarca, pericardial effusion.  Hemodialysis was initiated on 10/03/2023 via right femoral TDC.  She had hypotension on HD days. Hydralazine  increased of non-HD days to improve BP control. She was back on insulin  to improved DM control. 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 MVA = 2.750 cm2), small pericardial effusion.  She subsequently had a NM PYP Amyloid Spect w/CT on 12/30/23 that showed no evidence of cardiac amyloid. Moderate LVH and apical strain patter on echo felt likely secondary to LVH, but may consider additional testing if considered for renal transplant. Six month follow-up planned.    Historically, DM does not appear well controlled. A1c 11.8% on 07/12/2024, down from 12.5% on 03/16/2024. DM meds listed include Tresiba  36 units Q HS, Novolog  8-16 units SSI with meals.   PAT RN staff to clarify last Eliquis  dose with patient. More recently had inconsistent use due to financial reasons.   Anesthesia team to evaluate on the day of surgery.    VS: LMP 07/11/2024  Wt Readings from Last 3 Encounters:  07/21/24 98.1 kg  06/14/24 95.3 kg  05/03/24 94.8 kg   BP Readings from Last 3 Encounters:  07/21/24 (!) 153/86  07/20/24 (!) 194/95  06/14/24 (!) 184/86   Pulse Readings from Last 3 Encounters:  07/21/24 82  07/20/24 84  06/14/24 (!) 102     PROVIDERS: Arby Lyle LABOR, NP is PCP Maryelizabeth Bellingham, MD is cardiologist  Dolan Casco, MD is nephrologist Leopold Nest, NP is endocrinology provider [facility])   LABS: A1c 11.8% on 07/12/2024 and CBC and CMP results from 07/11/2024 include: WBC 10.2, H/H 10.4/34.6, PLT 173, Na 136, K 3.4, glucose 139, AST 8, ALT 5 ([facility]).   EKG: EKG 12/29/23: Sinus rhythm Borderline prolonged QT  interval Confirmed by Mannie Pac [phone]) on 12/31/2023 12:32:37 AM - QT/QTcB 451/494 ms   CV: RLE Venous US  07/11/2024 ([facility]): IMPRESSION:  No evidence of DVT.    NM PYP Amyloid Spect w/CT 12/30/2023 ([facility]): IMPRESSION:  1. No evidence of cardiac amyloid.      Echo (Complete) 12/16/2023 ([facility]): IMPRESSION: Left Ventricle: Systolic function is normal. EF: 65-70%. Quantitative  analysis of left ventricular Global Longitudinal Strain (GLS) imaging is  -13.5%, which is abnormal. Strain imaging pattern suggests cardiac  amyloidosis.   Left Ventricle: There is at least moderate concentric hypertrophy  (walls were not visualized).    Mitral Valve: There is mild stenosis, with a mean gradient of 3.000  mmHg. P1/2T = 80.000 ms. MVA = 2.750 cm2.    Mitral Valve: The leaflets are moderately thickened and exhibit mildly  reduced excursion.    Pericardium: There is a small pericardial effusion noted.   Normal LVEF with strain pattern suggestive of amyloidosis  At least moderate concentric left ventricular hypertrophy (walls were  difficult to visualize)  MAC with mild MS  Small pericardial effusion      Echo (Limited) 10/06/2023: IMPRESSIONS   1. Left ventricular ejection fraction, by estimation, is 60 to 65%. The  left ventricle has normal function. The left ventricle has no regional  wall motion abnormalities. There is mild left ventricular hypertrophy.   2. Right ventricular systolic function is normal. The right ventricular  size is normal.   3. Left atrial size was mildly dilated.   4. Effusion is slightly large than TTE done 09/28/23 IVC is not dilated  and collpases with inspiration with no RA/RV diastolic collapse no  tamponade Now measures 2.4 cm lateral to LV posterior wall . moderate to  large. The pericardial effusion is  circumferential.   5. The mitral valve was not assessed. No evidence of mitral valve  regurgitation. No evidence of mitral stenosis.    6. The aortic valve was not assessed. Aortic valve regurgitation is not  visualized. No aortic stenosis is present.   7. The inferior vena cava is normal in size with greater than 50%  respiratory variability, suggesting right atrial pressure of 3 mmHg.      Nuclear stress test 04/26/2019: IMPRESSION: 1. No reversible ischemia or infarction. 2. Normal left ventricular wall motion. 3. Left ventricular ejection fraction is 59%. 4. Non invasive risk stratification: Low    Past Medical History:  Diagnosis Date   Anemia    Asthma    as a child   CHF (congestive heart failure) (HCC)    Pt reports that father has CHF, not her   Chronic kidney disease    Diabetes mellitus without complication (HCC)    Dyslipidemia    GERD (gastroesophageal reflux disease)    Hypertension    Pulmonary embolism (HCC)    Stroke [facility])     Past Surgical History:  Procedure Laterality Date   A/V FISTULAGRAM N/A 06/01/2024   Procedure: A/V Fistulagram;  Surgeon: Pearline Norman RAMAN, MD;  Location: [facility];  Service: Cardiovascular;  Laterality: N/A;   A/V SHUNT INTERVENTION Right 03/28/2024   Procedure: A/V SHUNT INTERVENTION;  Surgeon: Magda Debby SAILOR, MD;  Location: [facility];  Service: Cardiovascular;  Laterality: Right;   APPLICATION OF WOUND VAC Right 04/12/2021   Procedure: APPLICATION OF WOUND VAC;  Surgeon: Elsa Lonni SAUNDERS, MD;  Location: [facility] OR;  Service: Orthopedics;  Laterality: Right;   AV FISTULA PLACEMENT Right 01/20/2024   Procedure: RIGHT ARM ARTERIOVENOUS (AV) BRACHIOCEPHALIC FISTULA CREATION;  Surgeon: Pearline Norman RAMAN, MD;  Location: [facility] OR;  Service: Vascular;  Laterality: Right;   DIALYSIS/PERMA CATHETER INSERTION N/A 10/02/2023   Procedure: DIALYSIS/PERMA CATHETER INSERTION;  Surgeon: Pearline Norman RAMAN, MD;  Location: [facility] INVASIVE CV LAB;  Service: Cardiovascular;  Laterality: N/A;   DIALYSIS/PERMA CATHETER INSERTION N/A 02/29/2024   Procedure: DIALYSIS/PERMA CATHETER INSERTION;  Surgeon:  Pearline Norman RAMAN, MD;  Location: [facility] INVASIVE CV LAB;  Service: Cardiovascular;  Laterality: N/A;   I & D EXTREMITY Right 04/06/2021   Procedure: IRRIGATION AND DEBRIDEMENT TOE WITH WOUND VAC PLACEMENT;  Surgeon: Elsa Lonni SAUNDERS, MD;  Location: [facility] OR;  Service: Orthopedics;  Laterality: Right;   I & D EXTREMITY Right 04/12/2021   Procedure: IRRIGATION AND DEBRIDEMENT RIGHT FOOT;  Surgeon: Elsa Lonni SAUNDERS, MD;  Location: [facility] OR;  Service: Orthopedics;  Laterality: Right;   INCISION AND DRAINAGE OF WOUND Right 05/03/2024   Procedure: RIGHT FOOT INCISION AND DRAINAGE OF DEEP FOOT ABSCESS DEBRIDEMENT OF CHRONIC ULCERATION, ADJACENT SOFT TISSUE TRANSFER;  Surgeon: Elsa Lonni SAUNDERS, MD;  Location: [facility] ORS;  Service: Orthopedics;  Laterality: Right;   IR FLUORO GUIDE CV LINE RIGHT  10/01/2023   IR FLUORO GUIDE CV LINE RIGHT  10/01/2023   IR FLUORO GUIDE CV LINE RIGHT  10/02/2023   IR US  GUIDE VASC ACCESS RIGHT  10/01/2023   LIGATION OF COMPETING BRANCHES OF ARTERIOVENOUS FISTULA Right 06/14/2024   Procedure: LIGATION OF COMPETING BRANCHES OF ARTERIOVENOUS FISTULA;  Surgeon: Pearline Norman RAMAN, MD;  Location: [facility] OR;  Service: Vascular;  Laterality: Right;   MOUTH SURGERY     ROBOTIC ASSITED PARTIAL NEPHRECTOMY Right 07/28/2019   Procedure: XI ROBOTIC ASSITED PARTIAL NEPHRECTOMY;  Surgeon: Sherrilee Belvie CROME, MD;  Location: [facility] ORS;  Service: Urology;  Laterality: Right;  3 HRS   SECONDARY CLOSURE OF WOUND Right 06/18/2021   Procedure: SECONDARY CLOSURE OF WOUND tRANSMETATARSAL AMPUTAION;  Surgeon: Elsa Lonni SAUNDERS, MD;  Location: [facility] OR;  Service: Orthopedics;  Laterality: Right;   TEMPORARY DIALYSIS CATHETER N/A 02/29/2024   Procedure: TEMPORARY DIALYSIS CATHETER;  Surgeon: Pearline Norman RAMAN, MD;  Location: [facility] INVASIVE CV LAB;  Service: Cardiovascular;  Laterality: N/A;   TRANSMETATARSAL AMPUTATION Right 04/12/2021   Procedure: TRANSMETATARSAL AMPUTATION;  Surgeon: Elsa Lonni SAUNDERS, MD;  Location: [facility] OR;   Service: Orthopedics;  Laterality: Right;   TUNNELLED CATHETER EXCHANGE N/A 01/05/2024   Procedure: TUNNELLED CATHETER EXCHANGE;  Surgeon: Tobie Gordy POUR, MD;  Location: [facility] INVASIVE CV LAB;  Service: Cardiovascular;  Laterality: N/A;   TUNNELLED CATHETER EXCHANGE N/A 06/01/2024   Procedure: TUNNELLED CATHETER EXCHANGE;  Surgeon: Pearline Norman RAMAN, MD;  Location: [facility];  Service: Cardiovascular;  Laterality: N/A;   TUNNELLED CATHETER EXCHANGE N/A 07/20/2024   Procedure: TUNNELLED CATHETER EXCHANGE;  Surgeon: Magda Debby SAILOR, MD;  Location: [facility];  Service: Cardiovascular;  Laterality: N/A;   VENOUS ANGIOPLASTY  03/28/2024   Procedure: VENOUS ANGIOPLASTY;  Surgeon: Magda Debby SAILOR, MD;  Location: [facility];  Service: Cardiovascular;;  Cephalic Vein    MEDICATIONS: No current facility-administered medications for this encounter.    apixaban  (ELIQUIS )  5 MG TABS tablet   doxycycline  (VIBRAMYCIN ) 100 MG capsule   gabapentin  (NEURONTIN ) 100 MG capsule   hydrALAZINE  (APRESOLINE ) 50 MG tablet   insulin  aspart (NOVOLOG ) 100 UNIT/ML FlexPen   nitroGLYCERIN  (NITROSTAT ) 0.4 MG SL tablet   omeprazole  (PRILOSEC) 20 MG capsule   rosuvastatin  (CRESTOR ) 10 MG tablet   sevelamer (RENAGEL) 800 MG tablet   TRESIBA  FLEXTOUCH 100 UNIT/ML FlexTouch Pen   Blood Glucose Monitoring Suppl (BLOOD GLUCOSE MONITOR SYSTEM) w/Device KIT   Continuous Blood Gluc Sensor (DEXCOM G6 SENSOR) MISC   Insulin  Pen Needle 32G X 4 MM MISC    Isaiah Ruder, PA-C Surgical Short Stay/Anesthesiology [facility] Phone [phone] [facility] Phone [phone] 08/03/2024 12:49 PM

## 2024-08-04 ENCOUNTER — Other Ambulatory Visit: Payer: Self-pay

## 2024-08-04 ENCOUNTER — Ambulatory Visit (HOSPITAL_COMMUNITY): Payer: Self-pay | Admitting: Vascular Surgery

## 2024-08-04 ENCOUNTER — Other Ambulatory Visit (HOSPITAL_COMMUNITY): Payer: Self-pay

## 2024-08-04 ENCOUNTER — Ambulatory Visit (HOSPITAL_COMMUNITY)
Admission: RE | Admit: 2024-08-04 | Discharge: 2024-08-04 | Disposition: A | Discharge status: Home / Self Care | Attending: Orthopaedic Surgery | Admitting: Orthopaedic Surgery

## 2024-08-04 ENCOUNTER — Encounter (HOSPITAL_COMMUNITY)
Admission: RE | Disposition: A | Discharge status: Home / Self Care | Payer: Self-pay | Source: Home / Self Care | Attending: Orthopaedic Surgery

## 2024-08-04 ENCOUNTER — Encounter (HOSPITAL_COMMUNITY): Payer: Self-pay | Admitting: Orthopaedic Surgery

## 2024-08-04 DIAGNOSIS — L02415 Cutaneous abscess of right lower limb: Secondary | ICD-10-CM

## 2024-08-04 DIAGNOSIS — I120 Hypertensive chronic kidney disease with stage 5 chronic kidney disease or end stage renal disease: Secondary | ICD-10-CM | POA: Diagnosis not present

## 2024-08-04 DIAGNOSIS — K219 Gastro-esophageal reflux disease without esophagitis: Secondary | ICD-10-CM | POA: Diagnosis not present

## 2024-08-04 DIAGNOSIS — D631 Anemia in chronic kidney disease: Secondary | ICD-10-CM | POA: Insufficient documentation

## 2024-08-04 DIAGNOSIS — E785 Hyperlipidemia, unspecified: Secondary | ICD-10-CM | POA: Insufficient documentation

## 2024-08-04 DIAGNOSIS — Z905 Acquired absence of kidney: Secondary | ICD-10-CM | POA: Diagnosis not present

## 2024-08-04 DIAGNOSIS — Z86718 Personal history of other venous thrombosis and embolism: Secondary | ICD-10-CM | POA: Diagnosis not present

## 2024-08-04 DIAGNOSIS — Z992 Dependence on renal dialysis: Secondary | ICD-10-CM | POA: Insufficient documentation

## 2024-08-04 DIAGNOSIS — Z86711 Personal history of pulmonary embolism: Secondary | ICD-10-CM | POA: Insufficient documentation

## 2024-08-04 DIAGNOSIS — I129 Hypertensive chronic kidney disease with stage 1 through stage 4 chronic kidney disease, or unspecified chronic kidney disease: Secondary | ICD-10-CM | POA: Insufficient documentation

## 2024-08-04 DIAGNOSIS — N186 End stage renal disease: Secondary | ICD-10-CM

## 2024-08-04 DIAGNOSIS — E1165 Type 2 diabetes mellitus with hyperglycemia: Secondary | ICD-10-CM | POA: Diagnosis not present

## 2024-08-04 DIAGNOSIS — Z794 Long term (current) use of insulin: Secondary | ICD-10-CM | POA: Diagnosis not present

## 2024-08-04 DIAGNOSIS — Z79899 Other long term (current) drug therapy: Secondary | ICD-10-CM | POA: Diagnosis not present

## 2024-08-04 DIAGNOSIS — I69354 Hemiplegia and hemiparesis following cerebral infarction affecting left non-dominant side: Secondary | ICD-10-CM | POA: Diagnosis not present

## 2024-08-04 DIAGNOSIS — Z7901 Long term (current) use of anticoagulants: Secondary | ICD-10-CM | POA: Diagnosis not present

## 2024-08-04 DIAGNOSIS — Z89431 Acquired absence of right foot: Secondary | ICD-10-CM | POA: Diagnosis not present

## 2024-08-04 DIAGNOSIS — E1122 Type 2 diabetes mellitus with diabetic chronic kidney disease: Secondary | ICD-10-CM | POA: Insufficient documentation

## 2024-08-04 HISTORY — PX: INCISION AND DRAINAGE OF DEEP ABSCESS, KNEE: SHX7364

## 2024-08-04 LAB — POCT I-STAT, CHEM 8
BUN: 21 mg/dL — ABNORMAL HIGH (ref 6–20)
Calcium, Ion: 1.07 mmol/L — ABNORMAL LOW (ref 1.15–1.40)
Chloride: 97 mmol/L — ABNORMAL LOW (ref 98–111)
Creatinine, Ser: 4.7 mg/dL — ABNORMAL HIGH (ref 0.44–1.00)
Glucose, Bld: 86 mg/dL (ref 70–99)
HCT: 35 % — ABNORMAL LOW (ref 36.0–46.0)
Hemoglobin: 11.9 g/dL — ABNORMAL LOW (ref 12.0–15.0)
Potassium: 3.2 mmol/L — ABNORMAL LOW (ref 3.5–5.1)
Sodium: 137 mmol/L (ref 135–145)
TCO2: 26 mmol/L (ref 22–32)

## 2024-08-04 LAB — GLUCOSE, CAPILLARY
Glucose-Capillary: 89 mg/dL (ref 70–99)
Glucose-Capillary: 41 mg/dL — CL (ref 70–99)
Glucose-Capillary: 37 mg/dL — CL (ref 70–99)
Glucose-Capillary: 37 mg/dL — CL (ref 70–99)
Glucose-Capillary: 60 mg/dL — ABNORMAL LOW (ref 70–99)
Glucose-Capillary: 109 mg/dL — ABNORMAL HIGH (ref 70–99)
Glucose-Capillary: 66 mg/dL — ABNORMAL LOW (ref 70–99)
Glucose-Capillary: 127 mg/dL — ABNORMAL HIGH (ref 70–99)
Glucose-Capillary: 55 mg/dL — ABNORMAL LOW (ref 70–99)
Glucose-Capillary: 132 mg/dL — ABNORMAL HIGH (ref 70–99)

## 2024-08-04 LAB — HCG, SERUM, QUALITATIVE: Preg, Serum: NEGATIVE

## 2024-08-04 SURGERY — INCISION AND DRAINAGE OF DEEP ABSCESS, KNEE
Anesthesia: General | Site: Knee | Laterality: Right

## 2024-08-04 MED ORDER — FENTANYL CITRATE (PF) 100 MCG/2ML IJ SOLN
INTRAMUSCULAR | Status: AC
  Filled 2024-08-04: qty 2

## 2024-08-04 MED ORDER — POVIDONE-IODINE 7.5 % EX SOLN
Freq: Once | CUTANEOUS | Status: DC
  Filled 2024-08-04: qty 118

## 2024-08-04 MED ORDER — DEXTROSE 50 % IV SOLN
INTRAVENOUS | Status: AC
  Administered 2024-08-04: 25 mL via INTRAVENOUS
  Filled 2024-08-04: qty 50

## 2024-08-04 MED ORDER — PHENYLEPHRINE 80 MCG/ML (10ML) SYRINGE FOR IV PUSH (FOR BLOOD PRESSURE SUPPORT)
PREFILLED_SYRINGE | INTRAVENOUS | Status: DC | PRN
Start: 2024-08-04 — End: 2024-08-04
  Administered 2024-08-04 (×2): 80 ug via INTRAVENOUS

## 2024-08-04 MED ORDER — PROPOFOL 10 MG/ML IV BOLUS
INTRAVENOUS | Status: DC | PRN
  Administered 2024-08-04: 200 mg via INTRAVENOUS

## 2024-08-04 MED ORDER — OXYCODONE HCL 5 MG PO TABS: 5.0000 mg | ORAL_TABLET | Freq: Once | ORAL | Status: DC | PRN

## 2024-08-04 MED ORDER — LABETALOL HCL 5 MG/ML IV SOLN
5.0000 mg | INTRAVENOUS | Status: DC | PRN
  Administered 2024-08-04: 5 mg via INTRAVENOUS

## 2024-08-04 MED ORDER — DEXTROSE 50 % IV SOLN
INTRAVENOUS | Status: DC | PRN
  Administered 2024-08-04: 25 g via INTRAVENOUS

## 2024-08-04 MED ORDER — OXYCODONE HCL 5 MG PO TABS
5.0000 mg | ORAL_TABLET | ORAL | 0 refills | Status: AC | PRN
  Filled 2024-08-04: qty 15, 3d supply, fill #0

## 2024-08-04 MED ORDER — DEXAMETHASONE SOD PHOSPHATE PF 10 MG/ML IJ SOLN
INTRAMUSCULAR | Status: DC | PRN
  Administered 2024-08-04: 5 mg via INTRAVENOUS

## 2024-08-04 MED ORDER — DEXTROSE 50 % IV SOLN
50.0000 mL | Freq: Once | INTRAVENOUS | Status: AC
  Administered 2024-08-04: 50 mL via INTRAVENOUS

## 2024-08-04 MED ORDER — SODIUM CHLORIDE 0.9 % IV SOLN: INTRAVENOUS | Status: DC

## 2024-08-04 MED ORDER — VANCOMYCIN HCL 1000 MG IV SOLR
INTRAVENOUS | Status: AC
  Filled 2024-08-04: qty 20

## 2024-08-04 MED ORDER — PROPOFOL 10 MG/ML IV BOLUS
INTRAVENOUS | Status: AC
Start: 2024-08-04 — End: 2024-08-04
  Filled 2024-08-04: qty 20

## 2024-08-04 MED ORDER — DOXYCYCLINE HYCLATE 100 MG PO TABS
100.0000 mg | ORAL_TABLET | Freq: Two times a day (BID) | ORAL | 0 refills | Status: AC
  Filled 2024-08-04: qty 14, 7d supply, fill #0

## 2024-08-04 MED ORDER — HYDRALAZINE HCL 20 MG/ML IJ SOLN
5.0000 mg | INTRAMUSCULAR | Status: DC | PRN
  Administered 2024-08-04 (×2): 5 mg via INTRAVENOUS
  Administered 2024-08-04: 10 mg via INTRAVENOUS

## 2024-08-04 MED ORDER — LIDOCAINE 2% (20 MG/ML) 5 ML SYRINGE
INTRAMUSCULAR | Status: DC | PRN
  Administered 2024-08-04: 100 mg via INTRAVENOUS

## 2024-08-04 MED ORDER — DEXTROSE 50 % IV SOLN: 25.0000 mL | Freq: Once | INTRAVENOUS | Status: AC

## 2024-08-04 MED ORDER — LABETALOL HCL 5 MG/ML IV SOLN
INTRAVENOUS | Status: AC
Start: 2024-08-04 — End: 2024-08-04
  Filled 2024-08-04: qty 4

## 2024-08-04 MED ORDER — HYDRALAZINE HCL 20 MG/ML IJ SOLN: 5.0000 mg | INTRAMUSCULAR | Status: DC | PRN

## 2024-08-04 MED ORDER — 0.9 % SODIUM CHLORIDE (POUR BTL) OPTIME
TOPICAL | Status: DC | PRN
  Administered 2024-08-04: 3000 mL

## 2024-08-04 MED ORDER — FENTANYL CITRATE (PF) 100 MCG/2ML IJ SOLN
25.0000 ug | INTRAMUSCULAR | Status: DC | PRN
  Administered 2024-08-04: 25 ug via INTRAVENOUS

## 2024-08-04 MED ORDER — HYDRALAZINE HCL 20 MG/ML IJ SOLN
INTRAMUSCULAR | Status: AC
Start: 2024-08-04 — End: 2024-08-04
  Filled 2024-08-04: qty 1

## 2024-08-04 MED ORDER — VANCOMYCIN HCL 1000 MG IV SOLR
INTRAVENOUS | Status: DC | PRN
  Administered 2024-08-04: 1000 mg

## 2024-08-04 MED ORDER — INSULIN ASPART 100 UNIT/ML IJ SOLN: 0.0000 [IU] | INTRAMUSCULAR | Status: DC | PRN

## 2024-08-04 MED ORDER — ORAL CARE MOUTH RINSE: 15.0000 mL | Freq: Once | OROMUCOSAL | Status: AC

## 2024-08-04 MED ORDER — CEFAZOLIN SODIUM-DEXTROSE 2-4 GM/100ML-% IV SOLN
2.0000 g | INTRAVENOUS | Status: AC
  Administered 2024-08-04: 2 g via INTRAVENOUS
  Filled 2024-08-04: qty 100

## 2024-08-04 MED ORDER — OXYCODONE HCL 5 MG/5ML PO SOLN: 5.0000 mg | Freq: Once | ORAL | Status: DC | PRN

## 2024-08-04 MED ORDER — LACTATED RINGERS IV SOLN: INTRAVENOUS | Status: DC

## 2024-08-04 MED ORDER — CHLORHEXIDINE GLUCONATE 0.12 % MT SOLN
15.0000 mL | Freq: Once | OROMUCOSAL | Status: AC
  Administered 2024-08-04: 15 mL via OROMUCOSAL
  Filled 2024-08-04: qty 15

## 2024-08-04 MED ORDER — ACETAMINOPHEN 500 MG PO TABS
1000.0000 mg | ORAL_TABLET | Freq: Once | ORAL | Status: AC
  Administered 2024-08-04: 1000 mg via ORAL
  Filled 2024-08-04: qty 2

## 2024-08-04 MED ORDER — AMOXICILLIN-POT CLAVULANATE 875-125 MG PO TABS
1.0000 | ORAL_TABLET | Freq: Two times a day (BID) | ORAL | 0 refills | Status: AC
  Filled 2024-08-04: qty 14, 7d supply, fill #0

## 2024-08-04 MED ORDER — DEXTROSE 50 % IV SOLN
25.0000 mL | Freq: Once | INTRAVENOUS | Status: AC
  Administered 2024-08-04: 25 mL via INTRAVENOUS

## 2024-08-04 MED ORDER — FENTANYL CITRATE (PF) 250 MCG/5ML IJ SOLN
INTRAMUSCULAR | Status: DC | PRN
Start: 2024-08-04 — End: 2024-08-04
  Administered 2024-08-04: 50 ug via INTRAVENOUS
  Administered 2024-08-04 (×2): 25 ug via INTRAVENOUS

## 2024-08-04 SURGICAL SUPPLY — 40 items
BAG COUNTER SPONGE SURGICOUNT (BAG) IMPLANT
BNDG COHESIVE 4X5 TAN STRL LF (GAUZE/BANDAGES/DRESSINGS) IMPLANT
BNDG COMPR ESMARK 4X3 LF (GAUZE/BANDAGES/DRESSINGS) IMPLANT
BNDG COMPR ESMARK 6X3 LF (GAUZE/BANDAGES/DRESSINGS) IMPLANT
BNDG ELASTIC 6X10 VLCR STRL LF (GAUZE/BANDAGES/DRESSINGS) ×1 IMPLANT
BNDG GAUZE DERMACEA FLUFF 4 (GAUZE/BANDAGES/DRESSINGS) ×1 IMPLANT
COVER SURGICAL LIGHT HANDLE (MISCELLANEOUS) ×2 IMPLANT
CUFF TRNQT CYL 34X4.125X (TOURNIQUET CUFF) ×1 IMPLANT
DRAPE U-SHAPE 47X51 STRL (DRAPES) ×1 IMPLANT
DRSG XEROFORM 1X8 (GAUZE/BANDAGES/DRESSINGS) IMPLANT
DURAPREP 26ML APPLICATOR (WOUND CARE) ×1 IMPLANT
ELECTRODE REM PT RTRN 9FT ADLT (ELECTROSURGICAL) ×1 IMPLANT
GAUZE PAD ABD 8X10 STRL (GAUZE/BANDAGES/DRESSINGS) IMPLANT
GAUZE SPONGE 4X4 12PLY STRL (GAUZE/BANDAGES/DRESSINGS) IMPLANT
GAUZE SPONGE 4X4 12PLY STRL LF (GAUZE/BANDAGES/DRESSINGS) ×1 IMPLANT
GAUZE XEROFORM 1X8 LF (GAUZE/BANDAGES/DRESSINGS) ×1 IMPLANT
GLOVE BIOGEL M STRL SZ7.5 (GLOVE) ×1 IMPLANT
GLOVE INDICATOR 8.0 STRL GRN (GLOVE) ×1 IMPLANT
GLOVE SRG 8 PF TXTR STRL LF DI (GLOVE) ×1 IMPLANT
GOWN STRL REUS W/ TWL LRG LVL3 (GOWN DISPOSABLE) ×1 IMPLANT
GOWN STRL REUS W/ TWL XL LVL3 (GOWN DISPOSABLE) ×1 IMPLANT
KIT BASIN OR (CUSTOM PROCEDURE TRAY) ×1 IMPLANT
KIT TURNOVER KIT B (KITS) ×1 IMPLANT
MANIFOLD NEPTUNE II (INSTRUMENTS) ×1 IMPLANT
NDL 22X1.5 STRL (OR ONLY) (MISCELLANEOUS) IMPLANT
NEEDLE 22X1.5 STRL (OR ONLY) (MISCELLANEOUS) IMPLANT
PACK ORTHO EXTREMITY (CUSTOM PROCEDURE TRAY) ×1 IMPLANT
PAD ABD 8X10 STRL (GAUZE/BANDAGES/DRESSINGS) IMPLANT
PAD ARMBOARD POSITIONER FOAM (MISCELLANEOUS) ×2 IMPLANT
PAD CAST 4YDX4 CTTN HI CHSV (CAST SUPPLIES) ×1 IMPLANT
PADDING CAST ABS COTTON 4X4 ST (CAST SUPPLIES) IMPLANT
PENCIL BUTTON HOLSTER BLD 10FT (ELECTRODE) IMPLANT
SET HNDPC FAN SPRY TIP SCT (DISPOSABLE) ×1 IMPLANT
SOLN 0.9% NACL POUR BTL 1000ML (IV SOLUTION) ×1 IMPLANT
SPONGE T-LAP 18X18 ~~LOC~~+RFID (SPONGE) IMPLANT
STOCKINETTE IMPERVIOUS 9X36 MD (GAUZE/BANDAGES/DRESSINGS) IMPLANT
SUT ETHILON 2 0 FS 18 (SUTURE) IMPLANT
TUBE CONNECTING 12X1/4 (SUCTIONS) ×1 IMPLANT
UNDERPAD 30X36 HEAVY ABSORB (UNDERPADS AND DIAPERS) ×1 IMPLANT
YANKAUER SUCT BULB TIP NO VENT (SUCTIONS) ×1 IMPLANT

## 2024-08-04 NOTE — Discharge Instructions (Signed)
 [facility] POST-OP INSTRUCTIONS   Pain Management The numbing medicine and your leg will last around 18 hours, take a dose of your pain medicine as soon as you feel it wearing off to avoid rebound pain. Keep your foot elevated above heart level.  Make sure that your heel hangs free ('floats'). Take all prescribed medication as directed. If taking narcotic pain medication you may want to use an over-the-counter stool softener to avoid constipation.  Activity Keep drain and dressing in place May weight bear as tolerated   First Postoperative Visit Your first postop visit will be Monday 08/08/24 with Dr. Elsa  If you do not have a postoperative visit scheduled please call [phone] to schedule an appointment. At the appointment your incision will be evaluated for suture removal, x-rays will be obtained if necessary.  General Instructions Swelling is very common after surgery.  Some amount of swelling will persist for 6-12 months. DO NOT change the dressing.  If there is a problem with the dressing (too tight, loose, gets wet, etc.) please contact Dr. Isiah office. DO NOT get the dressing wet.  For showers you can use an over-the-counter cast cover or wrap a washcloth around the top of your dressing and then cover it with a plastic bag and tape it to your leg. DO NOT soak the incision (no tubs, pools, bath, etc.) until you have approval from Dr. Elsa.  Contact Dr. Nadara office or go to Emergency Room if: Temperature above 101 F. Increasing pain that is unresponsive to pain medication or elevation Excessive redness or swelling in your foot Dressing problems - excessive bloody drainage, looseness or tightness, or if dressing gets wet Develop pain, swelling, warmth, or discoloration of your calf

## 2024-08-04 NOTE — Op Note (Signed)
  Sherri Dixon female 43 y.o. 08/04/2024  PreOperative Diagnosis: Right complex deep abscess, knee distal thigh area  PostOperative Diagnosis: Same  PROCEDURE: Incision and drainage of right leg complex deep abscess, in the area  SURGEON: Lonni Pae, MD  ASSISTANT: None  ANESTHESIA: LMA  FINDINGS: Deep abscess with purulent material and necrotic fatty tissue  IMPLANTS: None  INDICATIONS:43 y.o. female has been dealing with a abscess along the medial aspect of her distal thigh, proximal leg and knee area.  She underwent aspiration in the office by another provider however had return of her abscess.  She is here today for I&D of the abscess.   Patient understood the risks, benefits and alternatives to surgery which include but are not limited to wound healing complications, infection, nonunion, malunion, need for further surgery as well as damage to surrounding structures. They also understood the potential for continued pain in that there were no guarantees of acceptable outcome After weighing these risks the patient opted to proceed with surgery.  PROCEDURE: Patient was identi  Patient was taken to the operative suite and placed supine on the operative table.  General LMA anesthesia was induced without difficulty. Bump was placed under the operative hip and bone foam was used.  All bony prominences were well padded.  Tourniquet was placed on the operative thigh.  Preoperative antibiotics were given. The extremity was prepped and draped in the usual sterile fashion and surgical timeout was performed.    We began by palpating the abscess.  There was an area of central fluctuance with significant area of induration and redness surrounding it.  An incision was made overlying the area of fluctuance distally.  There was a rush of purulent material.  Then using a Kelly clamp the area of the abscess was deloculated.  It was significantly deep and past layer of fascia.  It was proximal  along the distal aspect of the hamstring tendons as well as medial posterior knee region.  The area was fully deloculated and tissue and purulent material was sent for culture.  Then the area was irrigated.  We irrigated it with nearly 2000 cc of normal saline.  Then once acceptable irrigation was performed we repalpated and acceptable drainage had been performed.  Then we placed a gram of vancomycin  powder within the abscess cavity.  Then a Penrose drain was placed.  The skin was then closed proximal to the Penrose drain with a nylon suture.  Soft dressing was placed.  She was awakened from anesthesia and taken to recovery in stable condition.  No complications.  Postop course: Patient will continue antibiotics She will be discharged from the PACU Weightbearing as tolerated She will follow-up in 3 days for wound check Keep dressing in place   TOURNIQUET TIME: No tourniquet  BLOOD LOSS:  Minimal         DRAINS: Penrose         SPECIMEN: Tissue and abscess cavity material for culture       COMPLICATIONS:  * No complications entered in OR log *         Disposition: PACU - hemodynamically stable.         Condition: stable

## 2024-08-04 NOTE — Progress Notes (Signed)
 CBG 78 at 1318. Number did not transfer over from CBG machine.

## 2024-08-04 NOTE — Anesthesia Postprocedure Evaluation (Signed)
 Anesthesia Post Note  Patient: Sherri Dixon  Procedure(s) Performed: INCISION AND DRAINAGE OF DEEP ABSCESS, KNEE (Right: Knee)     Patient location during evaluation: PACU Anesthesia Type: General Level of consciousness: awake Pain management: pain level controlled Vital Signs Assessment: post-procedure vital signs reviewed and stable Respiratory status: spontaneous breathing, nonlabored ventilation and respiratory function stable Cardiovascular status: blood pressure returned to baseline and stable Postop Assessment: no apparent nausea or vomiting Anesthetic complications: no   No notable events documented.  Last Vitals:  Vitals:   08/04/24 1445 08/04/24 1500  BP: (!) 170/85 (!) 174/82  Pulse: 80 83  Resp: 15 15  Temp: (!) 36 C (!) 36.2 C  SpO2: 100% 100%    Last Pain:  Vitals:   08/04/24 1200  TempSrc:   PainSc: Asleep                 Delon Aisha Arch

## 2024-08-04 NOTE — H&P (Signed)
 PREOPERATIVE H&P  Chief Complaint: Right leg abscess  HPI: Sherri Dixon is a 43 y.o. female who presents for preoperative history and physical with a diagnosis of right leg abscess near the knee.  She failed aspiration and antibiotics and is here today for surgery to have it drained.  She denies systemic symptoms of fever, chills or illness. Symptoms are rated as moderate to severe, and have been worsening.  This is significantly impairing activities of daily living.  She has elected for surgical management.   Past Medical History:  Diagnosis Date   Anemia    Asthma    as a child   CHF (congestive heart failure) (HCC)    Pt reports that father has CHF, not her   Chronic kidney disease    Diabetes mellitus without complication (HCC)    Type 2   Dyslipidemia    GERD (gastroesophageal reflux disease)    Hypertension    Pulmonary embolism (HCC)    Stroke [facility])    Past Surgical History:  Procedure Laterality Date   A/V FISTULAGRAM N/A 06/01/2024   Procedure: A/V Fistulagram;  Surgeon: Pearline Norman RAMAN, MD;  Location: [facility];  Service: Cardiovascular;  Laterality: N/A;   A/V SHUNT INTERVENTION Right 03/28/2024   Procedure: A/V SHUNT INTERVENTION;  Surgeon: Magda Debby SAILOR, MD;  Location: [facility];  Service: Cardiovascular;  Laterality: Right;   APPLICATION OF WOUND VAC Right 04/12/2021   Procedure: APPLICATION OF WOUND VAC;  Surgeon: Elsa Lonni SAUNDERS, MD;  Location: [facility] OR;  Service: Orthopedics;  Laterality: Right;   AV FISTULA PLACEMENT Right 01/20/2024   Procedure: RIGHT ARM ARTERIOVENOUS (AV) BRACHIOCEPHALIC FISTULA CREATION;  Surgeon: Pearline Norman RAMAN, MD;  Location: [facility] OR;  Service: Vascular;  Laterality: Right;   DIALYSIS/PERMA CATHETER INSERTION N/A 10/02/2023   Procedure: DIALYSIS/PERMA CATHETER INSERTION;  Surgeon: Pearline Norman RAMAN, MD;  Location: [facility] INVASIVE CV LAB;  Service: Cardiovascular;  Laterality: N/A;   DIALYSIS/PERMA CATHETER INSERTION N/A 02/29/2024   Procedure:  DIALYSIS/PERMA CATHETER INSERTION;  Surgeon: Pearline Norman RAMAN, MD;  Location: [facility] INVASIVE CV LAB;  Service: Cardiovascular;  Laterality: N/A;   I & D EXTREMITY Right 04/06/2021   Procedure: IRRIGATION AND DEBRIDEMENT TOE WITH WOUND VAC PLACEMENT;  Surgeon: Elsa Lonni SAUNDERS, MD;  Location: [facility] OR;  Service: Orthopedics;  Laterality: Right;   I & D EXTREMITY Right 04/12/2021   Procedure: IRRIGATION AND DEBRIDEMENT RIGHT FOOT;  Surgeon: Elsa Lonni SAUNDERS, MD;  Location: [facility] OR;  Service: Orthopedics;  Laterality: Right;   INCISION AND DRAINAGE OF WOUND Right 05/03/2024   Procedure: RIGHT FOOT INCISION AND DRAINAGE OF DEEP FOOT ABSCESS DEBRIDEMENT OF CHRONIC ULCERATION, ADJACENT SOFT TISSUE TRANSFER;  Surgeon: Elsa Lonni SAUNDERS, MD;  Location: [facility]S;  Service: Orthopedics;  Laterality: Right;   IR FLUORO GUIDE CV LINE RIGHT  10/01/2023   IR FLUORO GUIDE CV LINE RIGHT  10/01/2023   IR FLUORO GUIDE CV LINE RIGHT  10/02/2023   IR US  GUIDE VASC ACCESS RIGHT  10/01/2023   LIGATION OF COMPETING BRANCHES OF ARTERIOVENOUS FISTULA Right 06/14/2024   Procedure: LIGATION OF COMPETING BRANCHES OF ARTERIOVENOUS FISTULA;  Surgeon: Pearline Norman RAMAN, MD;  Location: [facility] OR;  Service: Vascular;  Laterality: Right;   MOUTH SURGERY     ROBOTIC ASSITED PARTIAL NEPHRECTOMY Right 07/28/2019   Procedure: XI ROBOTIC ASSITED PARTIAL NEPHRECTOMY;  Surgeon: Sherrilee Belvie CROME, MD;  Location: [facility];  Service: Urology;  Laterality: Right;  3 HRS   SECONDARY CLOSURE OF WOUND  Right 06/18/2021   Procedure: SECONDARY CLOSURE OF WOUND tRANSMETATARSAL AMPUTAION;  Surgeon: Elsa Lonni SAUNDERS, MD;  Location: [facility] OR;  Service: Orthopedics;  Laterality: Right;   TEMPORARY DIALYSIS CATHETER N/A 02/29/2024   Procedure: TEMPORARY DIALYSIS CATHETER;  Surgeon: Pearline Norman RAMAN, MD;  Location: [facility] INVASIVE CV LAB;  Service: Cardiovascular;  Laterality: N/A;   TRANSMETATARSAL AMPUTATION Right 04/12/2021   Procedure: TRANSMETATARSAL AMPUTATION;  Surgeon:  Elsa Lonni SAUNDERS, MD;  Location: [facility] OR;  Service: Orthopedics;  Laterality: Right;   TUNNELLED CATHETER EXCHANGE N/A 01/05/2024   Procedure: TUNNELLED CATHETER EXCHANGE;  Surgeon: Tobie Gordy POUR, MD;  Location: [facility] INVASIVE CV LAB;  Service: Cardiovascular;  Laterality: N/A;   TUNNELLED CATHETER EXCHANGE N/A 06/01/2024   Procedure: TUNNELLED CATHETER EXCHANGE;  Surgeon: Pearline Norman RAMAN, MD;  Location: [facility];  Service: Cardiovascular;  Laterality: N/A;   TUNNELLED CATHETER EXCHANGE N/A 07/20/2024   Procedure: TUNNELLED CATHETER EXCHANGE;  Surgeon: Magda Debby SAILOR, MD;  Location: [facility];  Service: Cardiovascular;  Laterality: N/A;   VENOUS ANGIOPLASTY  03/28/2024   Procedure: VENOUS ANGIOPLASTY;  Surgeon: Magda Debby SAILOR, MD;  Location: [facility];  Service: Cardiovascular;;  Cephalic Vein   Social History   Socioeconomic History   Marital status: Married    Spouse name: Not on file   Number of children: Not on file   Years of education: Not on file   Highest education level: Not on file  Occupational History   Not on file  Tobacco Use   Smoking status: Never   Smokeless tobacco: Never  Vaping Use   Vaping status: Never Used  Substance and Sexual Activity   Alcohol  use: Not Currently    Alcohol /week: 1.0 standard drink of alcohol     Types: 1 Glasses of wine per week    Comment: occas   Drug use: No   Sexual activity: Yes    Birth control/protection: None  Other Topics Concern   Not on file  Social History Narrative   Not on file   Social Drivers of Health   Financial Resource Strain: Low Risk  (10/28/2023)   Received from [facility]   Overall Financial Resource Strain (CARDIA)    Difficulty of Paying Living Expenses: Not hard at all  Food Insecurity: No Food Insecurity (10/28/2023)   Received from [facility]   Hunger Vital Sign    Within the past 12 months, you worried that your food would run out before you got the money to buy more.: Never true    Within  the past 12 months, the food you bought just didn't last and you didn't have money to get more.: Never true  Transportation Needs: No Transportation Needs (10/28/2023)   Received from [facility] - Transportation    Lack of Transportation (Medical): No    Lack of Transportation (Non-Medical): No  Physical Activity: Unknown (10/28/2023)   Received from [facility]   Exercise Vital Sign    On average, how many days per week do you engage in moderate to strenuous exercise (like a brisk walk)?: 0 days    Minutes of Exercise per Session: Not on file  Stress: Stress Concern Present (10/28/2023)   Received from [facility] - Occupational Stress Questionnaire    Feeling of Stress : To some extent  Social Connections: Socially Integrated (10/28/2023)   Received from [facility]   Social Network    How would you rate  your social network (family, work, friends)?: Good participation with social networks   Family History  Problem Relation Age of Onset   High blood pressure Mother    Diabetes Mother    Arthritis Mother    Hyperlipidemia Mother    Heart disease Mother    High blood pressure Father    Hyperlipidemia Father    Dementia Father    High blood pressure Sister    Diabetes Brother    Hyperlipidemia Brother    High blood pressure Brother    Colon cancer Neg Hx    Stomach cancer Neg Hx    Esophageal cancer Neg Hx    Pancreatic cancer Neg Hx    Liver disease Neg Hx    Allergies  Allergen Reactions   Amlodipine Swelling    Pelvis swells   Humalog  Kwikpen [Insulin  Lispro] Other (See Comments)    75/50---swelling and ulcers in mouth    Prior to Admission medications   Medication Sig Start Date End Date Taking? Authorizing Provider  apixaban  (ELIQUIS ) 5 MG TABS tablet Take 1 tablet (5 mg total) by mouth 2 (two) times daily. 06/15/24  Yes Schuh, McKenzi P, PA-C  doxycycline  (VIBRAMYCIN ) 100 MG capsule Take 1 capsule (100 mg  total) by mouth 2 (two) times daily. 07/20/24  Yes Magda Debby SAILOR, MD  hydrALAZINE  (APRESOLINE ) 50 MG tablet Take 50 mg by mouth in the morning and at bedtime. 03/24/24  Yes [provider]  insulin  aspart (NOVOLOG ) 100 UNIT/ML FlexPen Use as directed before each meal 3 times a day, 140-199 - 2 units, 200-250 - 4 units, 251-299 - 6 units,  300-349 - 8 units,  350 or above 10 units. Patient taking differently: Inject 8-16 Units into the skin 3 (three) times daily with meals. Sliding scale 10/06/23  Yes Dennise Lavada POUR, MD  nitroGLYCERIN  (NITROSTAT ) 0.4 MG SL tablet Take 0.4 mg by mouth every 5 (five) minutes as needed for chest pain. 04/17/19  Yes [provider]  omeprazole  (PRILOSEC) 20 MG capsule TAKE 1 CAPSULE (20 MG TOTAL) BY MOUTH 2 (TWO) TIMES DAILY BEFORE A MEAL. Patient taking differently: Take 20 mg by mouth daily as needed (acid reflux or indigestion). 04/14/23  Yes Hope Almarie ORN, NP  rosuvastatin  (CRESTOR ) 10 MG tablet Take 1 tablet (10 mg total) by mouth daily. 10/15/23  Yes [place], Eric J, DO  sevelamer (RENAGEL) 800 MG tablet Take 800 mg by mouth 3 (three) times daily with meals.   Yes [provider]  TRESIBA  FLEXTOUCH 100 UNIT/ML FlexTouch Pen Inject 36 Units into the skin at bedtime.   Yes [provider]  Blood Glucose Monitoring Suppl (BLOOD GLUCOSE MONITOR SYSTEM) w/Device KIT Use as directed in the morning, at noon, and at bedtime. 10/06/23   Singh, Prashant K, MD  Continuous Blood Gluc Sensor (DEXCOM G6 SENSOR) MISC by Does not apply route. 08/13/22   [provider]  gabapentin  (NEURONTIN ) 100 MG capsule Take 100 mg by mouth 2 (two) times daily as needed (nerve pain).    [provider]  Insulin  Pen Needle 32G X 4 MM MISC Use as directed 4 times a day for subcutaneous insulin  injection. 10/06/23   Dennise Lavada POUR, MD     Positive ROS: All other systems have been reviewed and were otherwise negative with the exception of  those mentioned in the HPI and as above.  Physical Exam:  Vitals:   08/04/24 0813  BP: 136/70  Pulse: 92  Resp: 18  Temp:  98.1 F (36.7 C)  SpO2: 100%   General: Alert, no acute distress Cardiovascular: No pedal edema Respiratory: No cyanosis, no use of accessory musculature GI: No organomegaly, abdomen is soft and non-tender Skin: No lesions in the area of chief complaint Neurologic: Sensation intact distally Psychiatric: Patient is competent for consent with normal mood and affect Lymphatic: No axillary or cervical lymphadenopathy  MUSCULOSKELETAL: Right leg demonstrates medial proximal leg and distal thigh abscess with surrounding induration and fluctuance.  Assessment: Right distal thigh/proximal leg abscess   Plan: Plan for I&D of her abscess.  It is deep and surrounded by induration.  She will likely require postoperative IV antibiotics and we will plan for overnight stay.  We discussed the risks, benefits and alternatives of surgery which include but are not limited to wound healing complications, infection, nonunion, malunion, need for further surgery, damage to surrounding structures and continued pain.  They understand there is no guarantees to an acceptable outcome.  After weighing these risks they opted to proceed with surgery.     Lonni JONELLE Pae, MD    08/04/2024 9:17 AM

## 2024-08-04 NOTE — Transfer of Care (Signed)
 Immediate Anesthesia Transfer of Care Note  Patient: Sherri Dixon  Procedure(s) Performed: INCISION AND DRAINAGE OF DEEP ABSCESS, KNEE (Right: Knee)  Patient Location: PACU  Anesthesia Type:General  Level of Consciousness: drowsy and responds to stimulation  Airway & Oxygen Therapy: Patient Spontanous Breathing  Post-op Assessment: Report given to RN and Post -op Vital signs reviewed and stable  Post vital signs: Reviewed and stable  Last Vitals:  Vitals Value Taken Time  BP 187/95 08/04/24 11:36  Temp    Pulse 83 08/04/24 11:39  Resp 12 08/04/24 11:39  SpO2 99 % 08/04/24 11:39  Vitals shown include unfiled device data.  Last Pain:  Vitals:   08/04/24 0821  TempSrc:   PainSc: 0-No pain         Complications: No notable events documented.

## 2024-08-04 NOTE — Progress Notes (Signed)
 Wasted Fentanyl  75mcg IV with [employer].

## 2024-08-04 NOTE — Anesthesia Procedure Notes (Signed)
 Procedure Name: LMA Insertion Date/Time: 08/04/2024 11:01 AM  Performed by: Virgil Ee, CRNAPre-anesthesia Checklist: Patient identified, Patient being monitored, Timeout performed, Emergency Drugs available and Suction available Patient Re-evaluated:Patient Re-evaluated prior to induction Oxygen Delivery Method: Circle system utilized Preoxygenation: Pre-oxygenation with 100% oxygen Induction Type: IV induction Ventilation: Mask ventilation without difficulty LMA: LMA inserted LMA Size: 4.0 Tube type: Oral Number of attempts: 1 Placement Confirmation: positive ETCO2 and breath sounds checked- equal and bilateral Tube secured with: Tape Dental Injury: Teeth and Oropharynx as per pre-operative assessment

## 2024-08-05 ENCOUNTER — Encounter (HOSPITAL_COMMUNITY): Payer: Self-pay | Admitting: Orthopaedic Surgery

## 2024-08-09 LAB — AEROBIC/ANAEROBIC CULTURE W GRAM STAIN (SURGICAL/DEEP WOUND): Culture: NO GROWTH

## 2024-08-29 ENCOUNTER — Emergency Department (HOSPITAL_COMMUNITY)
Admission: EM | Admit: 2024-08-29 | Discharge: 2024-08-29 | Disposition: A | Discharge status: Home / Self Care | Attending: Emergency Medicine | Admitting: Emergency Medicine

## 2024-08-29 ENCOUNTER — Encounter (HOSPITAL_COMMUNITY): Payer: Self-pay | Admitting: *Deleted

## 2024-08-29 ENCOUNTER — Other Ambulatory Visit: Payer: Self-pay

## 2024-08-29 DIAGNOSIS — L500 Allergic urticaria: Secondary | ICD-10-CM | POA: Insufficient documentation

## 2024-08-29 DIAGNOSIS — Z794 Long term (current) use of insulin: Secondary | ICD-10-CM | POA: Diagnosis not present

## 2024-08-29 DIAGNOSIS — L509 Urticaria, unspecified: Secondary | ICD-10-CM

## 2024-08-29 DIAGNOSIS — T7840XA Allergy, unspecified, initial encounter: Secondary | ICD-10-CM | POA: Diagnosis present

## 2024-08-29 MED ORDER — DIPHENHYDRAMINE HCL 50 MG/ML IJ SOLN
50.0000 mg | Freq: Once | INTRAMUSCULAR | Status: AC
  Administered 2024-08-29: 50 mg via INTRAMUSCULAR
  Filled 2024-08-29: qty 1

## 2024-08-29 MED ORDER — PREDNISONE 10 MG PO TABS: ORAL_TABLET | ORAL | 0 refills | Status: AC

## 2024-08-29 MED ORDER — PREDNISONE 20 MG PO TABS
60.0000 mg | ORAL_TABLET | Freq: Once | ORAL | Status: AC
  Administered 2024-08-29: 60 mg via ORAL
  Filled 2024-08-29: qty 3

## 2024-08-29 NOTE — ED Provider Notes (Signed)
 [facility] EMERGENCY DEPARTMENT AT [facility] Provider Note   CSN: [id number] Arrival date & time: 08/29/24  0345     Patient presents with: Allergic Reaction   Sherri Dixon is a 43 y.o. female.    Allergic Reaction    43 year old female presenting to the emergency department with concern for an allergic reaction and hives.  The patient states that when she underwent dialysis on Sunday she subsequently developed hives.  Her last dialysis treatment was Friday and she has not missed any sessions.  She denies any swelling of her tongue or mouth, denies any sensation of her throat closing up.  She denies any shortness of breath, denies any nausea or vomiting.  She has had hives that cover her body that have not resolved with Zyrtec or Benadryl .  No fevers, no other concerns at this time.  Her hives are very pruritic.  Prior to Admission medications   Medication Sig Start Date End Date Taking? Authorizing Provider  predniSONE (DELTASONE) 10 MG tablet Take 4 tablets (40 mg total) by mouth daily for 2 days, THEN 2 tablets (20 mg total) daily for 2 days, THEN 1 tablet (10 mg total) daily for 1 day. 08/29/24 09/03/24 Yes Jerrol Agent, MD  amoxicillin -clavulanate (AUGMENTIN ) 875-125 MG tablet Take 1 tablet by mouth 2 (two) times daily. 08/04/24   Jordan, Jesse J, PA-C  apixaban  (ELIQUIS ) 5 MG TABS tablet Take 1 tablet (5 mg total) by mouth 2 (two) times daily. 06/15/24   Schuh, McKenzi P, PA-C  Blood Glucose Monitoring Suppl (BLOOD GLUCOSE MONITOR SYSTEM) w/Device KIT Use as directed in the morning, at noon, and at bedtime. 10/06/23   Singh, Prashant K, MD  Continuous Blood Gluc Sensor (DEXCOM G6 SENSOR) MISC by Does not apply route. 08/13/22   [provider]  doxycycline  (VIBRA -TABS) 100 MG tablet Take 1 tablet (100 mg total) by mouth 2 (two) times daily. 08/04/24   Jordan, Jesse J, PA-C  doxycycline  (VIBRAMYCIN ) 100 MG capsule Take 1 capsule (100 mg total) by mouth 2 (two) times  daily. 07/20/24   Magda Debby SAILOR, MD  gabapentin  (NEURONTIN ) 100 MG capsule Take 100 mg by mouth 2 (two) times daily as needed (nerve pain).    [provider]  hydrALAZINE  (APRESOLINE ) 50 MG tablet Take 50 mg by mouth in the morning and at bedtime. 03/24/24   [provider]  insulin  aspart (NOVOLOG ) 100 UNIT/ML FlexPen Use as directed before each meal 3 times a day, 140-199 - 2 units, 200-250 - 4 units, 251-299 - 6 units,  300-349 - 8 units,  350 or above 10 units. Patient taking differently: Inject 8-16 Units into the skin 3 (three) times daily with meals. Sliding scale 10/06/23   Singh, Prashant K, MD  Insulin  Pen Needle 32G X 4 MM MISC Use as directed 4 times a day for subcutaneous insulin  injection. 10/06/23   Singh, Prashant K, MD  nitroGLYCERIN  (NITROSTAT ) 0.4 MG SL tablet Take 0.4 mg by mouth every 5 (five) minutes as needed for chest pain. 04/17/19   [provider]  omeprazole  (PRILOSEC) 20 MG capsule TAKE 1 CAPSULE (20 MG TOTAL) BY MOUTH 2 (TWO) TIMES DAILY BEFORE A MEAL. Patient taking differently: Take 20 mg by mouth daily as needed (acid reflux or indigestion). 04/14/23   Hope Almarie ORN, NP  oxyCODONE  (ROXICODONE ) 5 MG immediate release tablet Take 1 tablet (5 mg total) by mouth every 4 (four) hours as needed for severe pain (pain score 7-10). 08/04/24  Jordan, Jesse J, PA-C  rosuvastatin  (CRESTOR ) 10 MG tablet Take 1 tablet (10 mg total) by mouth daily. 10/15/23   [place], Camellia PARAS, DO  sevelamer (RENAGEL) 800 MG tablet Take 800 mg by mouth 3 (three) times daily with meals.    [provider]  TRESIBA  FLEXTOUCH 100 UNIT/ML FlexTouch Pen Inject 36 Units into the skin at bedtime.    [provider]    Allergies: Amlodipine and Humalog  kwikpen [insulin  lispro]    Review of Systems  All other systems reviewed and are negative.   Updated Vital Signs BP (!) 193/104 (BP Location: Left Arm) Comment: EDP aware of elevated blood pressures  Pulse  86   Temp 98.1 F (36.7 C)   Resp 16   Ht 5' 5 (1.651 m)   Wt 97.5 kg   SpO2 100%   BMI 35.78 kg/m   Physical Exam Vitals and nursing note reviewed.  Constitutional:      General: She is not in acute distress. HENT:     Head: Normocephalic and atraumatic.  Eyes:     Conjunctiva/sclera: Conjunctivae normal.     Pupils: Pupils are equal, round, and reactive to light.  Cardiovascular:     Rate and Rhythm: Normal rate and regular rhythm.  Pulmonary:     Effort: Pulmonary effort is normal. No respiratory distress.     Breath sounds: Normal breath sounds.     Comments: No wheezing or rales Abdominal:     General: There is no distension.     Tenderness: There is no guarding.  Musculoskeletal:        General: No deformity or signs of injury.     Cervical back: Neck supple.  Skin:    Findings: Rash present. No lesion.     Comments: Hives present on the bilateral upper extremities  Neurological:     General: No focal deficit present.     Mental Status: She is alert. Mental status is at baseline.     (all labs ordered are listed, but only abnormal results are displayed) Labs Reviewed - No data to display  EKG: None  Radiology: No results found.   Procedures   Medications Ordered in the ED  diphenhydrAMINE  (BENADRYL ) injection 50 mg (50 mg Intramuscular Given 08/29/24 0453)  predniSONE (DELTASONE) tablet 60 mg (60 mg Oral Given 08/29/24 0453)                                    Medical Decision Making Risk Prescription drug management.     43 year old female presenting to the emergency department with concern for an allergic reaction and hives.  The patient states that when she underwent dialysis on Sunday she subsequently developed hives.  Her last dialysis treatment was Friday and she has not missed any sessions.  She denies any swelling of her tongue or mouth, denies any sensation of her throat closing up.  She denies any shortness of breath, denies any nausea  or vomiting.  She has had hives that cover her body that have not resolved with Zyrtec or Benadryl .  No fevers, no other concerns at this time.  Her hives are very pruritic.  On arrival, the patient was vitally stable, afebrile, not tachycardic or tachypneic, BP initially 213/107, removed 193/104, saturating well on room air.  Patient presenting with urticaria which has been present for the past week, not responding to outpatient antihistamines.  IM Benadryl   provided in addition to prednisone.  Advised continued outpatient antihistamines, no evidence for anaphylaxis at this time, no wheezing, no swelling of the mucous membranes, no evidence for cellulitis of the extremities, no nausea or vomiting.    Differential Diagnoses: There are no red flag symptoms such as fever, mucosal involvement, hypotension, toxic appearance, severe pain, immunosuppression, or a concerning medication being used. There are no physical exam findings or symptoms on ROS concerning for erythema multiforme, SJS/TEN, Lyme disease, cellulitis, necrotizing fasciitis, purupra fulminans, angioedema, anaphylaxis, meningococcemia, DRESS, or RMSF. I do not think that the patient is septic.  I believe the patient  is safe for discharge home. The family feels safe with this plan. I prescribed Prednisone. They agreed to followup with their PCP.      Final diagnoses:  Allergic reaction, initial encounter  Hives    ED Discharge Orders          Ordered    predniSONE (DELTASONE) 10 MG tablet  Daily        08/29/24 0454               Jerrol Agent, MD 08/29/24 [phone]

## 2024-08-29 NOTE — ED Triage Notes (Signed)
 Patient c/o allergic reaction to dialysis treatment on last Sunday , states it has progressively gotten worse throught the week, last dialysis was Friday. Normal dialysis days are M-W-F no resp problems

## 2024-08-29 NOTE — Discharge Instructions (Addendum)
 Take 10 mg of Zyrtec twice daily in addition to benadryl . Watch your blood sugar closely with your prednisone prescription. Follow-up outpatient with your PCP to ensure resolution.

## 2024-09-07 ENCOUNTER — Encounter (HOSPITAL_COMMUNITY): Payer: Self-pay | Admitting: Vascular Surgery

## 2024-09-07 ENCOUNTER — Other Ambulatory Visit: Payer: Self-pay

## 2024-09-07 ENCOUNTER — Encounter (HOSPITAL_COMMUNITY): Admission: RE | Disposition: A | Payer: Self-pay | Source: Home / Self Care | Attending: Vascular Surgery

## 2024-09-07 ENCOUNTER — Ambulatory Visit (HOSPITAL_COMMUNITY)
Admission: RE | Admit: 2024-09-07 | Discharge: 2024-09-07 | Disposition: A | Attending: Vascular Surgery | Admitting: Vascular Surgery

## 2024-09-07 DIAGNOSIS — Z992 Dependence on renal dialysis: Secondary | ICD-10-CM

## 2024-09-07 DIAGNOSIS — N186 End stage renal disease: Secondary | ICD-10-CM

## 2024-09-07 HISTORY — PX: A/V FISTULAGRAM: CATH118298

## 2024-09-07 LAB — GLUCOSE, CAPILLARY: Glucose-Capillary: 594 mg/dL (ref 70–99)

## 2024-09-07 SURGERY — A/V FISTULAGRAM
Anesthesia: LOCAL | Site: Arm Upper | Laterality: Right

## 2024-09-07 MED ORDER — IODIXANOL 320 MG/ML IV SOLN
INTRAVENOUS | Status: DC | PRN
  Administered 2024-09-07: 20 mL via INTRAVENOUS

## 2024-09-07 MED ORDER — LIDOCAINE HCL (PF) 1 % IJ SOLN
INTRAMUSCULAR | Status: AC
  Filled 2024-09-07: qty 30

## 2024-09-07 MED ORDER — INSULIN ASPART 100 UNIT/ML IJ SOLN: 10.0000 [IU] | Freq: Once | INTRAMUSCULAR | Status: DC

## 2024-09-07 MED ORDER — HEPARIN (PORCINE) IN NACL 1000-0.9 UT/500ML-% IV SOLN
INTRAVENOUS | Status: DC | PRN
  Administered 2024-09-07: 500 mL

## 2024-09-07 MED ORDER — LIDOCAINE HCL (PF) 1 % IJ SOLN
INTRAMUSCULAR | Status: DC | PRN
  Administered 2024-09-07: 2 mL via SUBCUTANEOUS

## 2024-09-07 SURGICAL SUPPLY — 5 items
KIT MICROPUNCTURE NIT STIFF (SHEATH) IMPLANT
SHEATH PROBE COVER 6X72 (BAG) IMPLANT
STOPCOCK MORSE 400PSI 3WAY (MISCELLANEOUS) IMPLANT
TRAY PV CATH (CUSTOM PROCEDURE TRAY) ×1 IMPLANT
TUBING CIL FLEX 10 FLL-RA (TUBING) IMPLANT

## 2024-09-07 NOTE — H&P (Signed)
 HD ACCESS CENTER H&P   Patient ID: Sherri Dixon, female   DOB: 1981/07/09, 43 y.o.   MRN: [id number]  Subjective:     HPI Sherri Dixon is a 43 y.o. female with ESRD presenting for evaluation of HD access and consideration of intervention. Referred by: Dr. Dalene Having issues with cannulation and infiltration  Past Medical History:  Diagnosis Date   Anemia    Asthma    as a child   CHF (congestive heart failure) (HCC)    Pt reports that father has CHF, not her   Chronic kidney disease    Diabetes mellitus without complication (HCC)    Type 2   Dyslipidemia    GERD (gastroesophageal reflux disease)    Hypertension    Pulmonary embolism (HCC)    Stroke (HCC)    Family History  Problem Relation Age of Onset   High blood pressure Mother    Diabetes Mother    Arthritis Mother    Hyperlipidemia Mother    Heart disease Mother    High blood pressure Father    Hyperlipidemia Father    Dementia Father    High blood pressure Sister    Diabetes Brother    Hyperlipidemia Brother    High blood pressure Brother    Colon cancer Neg Hx    Stomach cancer Neg Hx    Esophageal cancer Neg Hx    Pancreatic cancer Neg Hx    Liver disease Neg Hx    Past Surgical History:  Procedure Laterality Date   A/V FISTULAGRAM N/A 06/01/2024   Procedure: A/V Fistulagram;  Surgeon: Pearline Norman RAMAN, MD;  Location: [facility];  Service: Cardiovascular;  Laterality: N/A;   A/V SHUNT INTERVENTION Right 03/28/2024   Procedure: A/V SHUNT INTERVENTION;  Surgeon: Magda Debby SAILOR, MD;  Location: [facility];  Service: Cardiovascular;  Laterality: Right;   APPLICATION OF WOUND VAC Right 04/12/2021   Procedure: APPLICATION OF WOUND VAC;  Surgeon: Elsa Lonni SAUNDERS, MD;  Location: [facility] OR;  Service: Orthopedics;  Laterality: Right;   AV FISTULA PLACEMENT Right 01/20/2024   Procedure: RIGHT ARM ARTERIOVENOUS (AV) BRACHIOCEPHALIC FISTULA CREATION;  Surgeon: Pearline Norman RAMAN, MD;  Location: [facility] OR;  Service:  Vascular;  Laterality: Right;   DIALYSIS/PERMA CATHETER INSERTION N/A 10/02/2023   Procedure: DIALYSIS/PERMA CATHETER INSERTION;  Surgeon: Pearline Norman RAMAN, MD;  Location: [facility] INVASIVE CV LAB;  Service: Cardiovascular;  Laterality: N/A;   DIALYSIS/PERMA CATHETER INSERTION N/A 02/29/2024   Procedure: DIALYSIS/PERMA CATHETER INSERTION;  Surgeon: Pearline Norman RAMAN, MD;  Location: [facility] INVASIVE CV LAB;  Service: Cardiovascular;  Laterality: N/A;   I & D EXTREMITY Right 04/06/2021   Procedure: IRRIGATION AND DEBRIDEMENT TOE WITH WOUND VAC PLACEMENT;  Surgeon: Elsa Lonni SAUNDERS, MD;  Location: [facility] OR;  Service: Orthopedics;  Laterality: Right;   I & D EXTREMITY Right 04/12/2021   Procedure: IRRIGATION AND DEBRIDEMENT RIGHT FOOT;  Surgeon: Elsa Lonni SAUNDERS, MD;  Location: [facility] OR;  Service: Orthopedics;  Laterality: Right;   INCISION AND DRAINAGE OF DEEP ABSCESS, KNEE Right 08/04/2024   Procedure: INCISION AND DRAINAGE OF DEEP ABSCESS, KNEE;  Surgeon: Elsa Lonni SAUNDERS, MD;  Location: [facility] OR;  Service: Orthopedics;  Laterality: Right;   INCISION AND DRAINAGE OF WOUND Right 05/03/2024   Procedure: RIGHT FOOT INCISION AND DRAINAGE OF DEEP FOOT ABSCESS DEBRIDEMENT OF CHRONIC ULCERATION, ADJACENT SOFT TISSUE TRANSFER;  Surgeon: Elsa Lonni SAUNDERS, MD;  Location: [facility] ORS;  Service: Orthopedics;  Laterality: Right;   IR FLUORO  GUIDE CV LINE RIGHT  10/01/2023   IR FLUORO GUIDE CV LINE RIGHT  10/01/2023   IR FLUORO GUIDE CV LINE RIGHT  10/02/2023   IR US  GUIDE VASC ACCESS RIGHT  10/01/2023   LIGATION OF COMPETING BRANCHES OF ARTERIOVENOUS FISTULA Right 06/14/2024   Procedure: LIGATION OF COMPETING BRANCHES OF ARTERIOVENOUS FISTULA;  Surgeon: Pearline Norman RAMAN, MD;  Location: [facility] OR;  Service: Vascular;  Laterality: Right;   MOUTH SURGERY     ROBOTIC ASSITED PARTIAL NEPHRECTOMY Right 07/28/2019   Procedure: XI ROBOTIC ASSITED PARTIAL NEPHRECTOMY;  Surgeon: Sherrilee Belvie CROME, MD;  Location: [facility];  Service: Urology;   Laterality: Right;  3 HRS   SECONDARY CLOSURE OF WOUND Right 06/18/2021   Procedure: SECONDARY CLOSURE OF WOUND tRANSMETATARSAL AMPUTAION;  Surgeon: Elsa Lonni SAUNDERS, MD;  Location: [facility] OR;  Service: Orthopedics;  Laterality: Right;   TEMPORARY DIALYSIS CATHETER N/A 02/29/2024   Procedure: TEMPORARY DIALYSIS CATHETER;  Surgeon: Pearline Norman RAMAN, MD;  Location: [facility] INVASIVE CV LAB;  Service: Cardiovascular;  Laterality: N/A;   TRANSMETATARSAL AMPUTATION Right 04/12/2021   Procedure: TRANSMETATARSAL AMPUTATION;  Surgeon: Elsa Lonni SAUNDERS, MD;  Location: [facility] OR;  Service: Orthopedics;  Laterality: Right;   TUNNELLED CATHETER EXCHANGE N/A 01/05/2024   Procedure: TUNNELLED CATHETER EXCHANGE;  Surgeon: Tobie Gordy POUR, MD;  Location: [facility] INVASIVE CV LAB;  Service: Cardiovascular;  Laterality: N/A;   TUNNELLED CATHETER EXCHANGE N/A 06/01/2024   Procedure: TUNNELLED CATHETER EXCHANGE;  Surgeon: Pearline Norman RAMAN, MD;  Location: [facility];  Service: Cardiovascular;  Laterality: N/A;   TUNNELLED CATHETER EXCHANGE N/A 07/20/2024   Procedure: TUNNELLED CATHETER EXCHANGE;  Surgeon: Magda Debby SAILOR, MD;  Location: [facility];  Service: Cardiovascular;  Laterality: N/A;   VENOUS ANGIOPLASTY  03/28/2024   Procedure: VENOUS ANGIOPLASTY;  Surgeon: Magda Debby SAILOR, MD;  Location: [facility];  Service: Cardiovascular;;  Cephalic Vein    Short Social History:  Social History   Tobacco Use   Smoking status: Never   Smokeless tobacco: Never  Substance Use Topics   Alcohol  use: Not Currently    Alcohol /week: 1.0 standard drink of alcohol     Types: 1 Glasses of wine per week    Comment: occas    Allergies  Allergen Reactions   Amlodipine Swelling    Pelvis swells   Humalog  Kwikpen [Insulin  Lispro] Other (See Comments)    75/50---swelling and ulcers in mouth     Current Facility-Administered Medications  Medication Dose Route Frequency Provider Last Rate Last Admin   insulin  aspart (novoLOG ) injection 10  Units  10 Units Subcutaneous Once Pearline Norman RAMAN, MD        REVIEW OF SYSTEMS All other systems were reviewed and are negative     Objective:   Objective   Vitals:   09/07/24 0730 09/07/24 0756  BP: (!) 217/99 (!) 207/91  Pulse: 92 87  Resp: 12 18  Temp: 98.4 F (36.9 C)   SpO2: 93% 93%   There is no height or weight on file to calculate BMI.  Physical Exam General: no acute distress Cardiac: hemodynamically stable Extremities: Bruising over top of right arm fistula, palpable thrill      Assessment/Plan:   Amaiah Cristiano is a 43 y.o. female with ESRD and a poorly functioning right arm fistula. Having issues with cannulation and infiltration. Last HD session Monday.  We discussed first-line option of fistulogram to better evaluate flow as well as potentially treat flow-limiting stenoses that could put this access  at risk of future thrombosis. Risks and benefits were reviewed and the patient elected to proceed.   Norman Serve, MD [facility]

## 2024-09-07 NOTE — Op Note (Signed)
° ° °  Patient name: Sherri Dixon MRN: [id number] DOB: 1981-03-03 Sex: female  09/07/2024 Pre-operative Diagnosis: ESRD on HD Post-operative diagnosis:  Same Surgeon:  Norman GORMAN Serve, MD Procedure Performed:  Outpatient evaluation, level 3 Ultrasound-guided access of dialysis circuit, fistulogram and central venogram.  [id number]  Indications: [name] is a 43 year old female with ESRD on HD.  She has a poorly functioning right arm AV fistula and has been having issues with cannulation and infiltration.  Her last HD session was Monday.  She also has a femoral TDC that she has been getting dialysis through since she has had an infiltration and swelling around her right arm fistula.  Recent benefits of fistulogram were reviewed and she elected to proceed.  Findings:  Widely patent central venous system and upper arm outflow vein.  Widely patent AV fistula with no flow-limiting stenosis.  Patent anastomosis   Procedure:  The patient was identified in the holding area and taken to the cath lab  The patient was then placed supine on the table and prepped and draped in the usual sterile fashion.  A time out was called.  Ultrasound was used to evaluate the right arm AV access. This was accessed under u/s guidance. An 018 wire was advanced without resistance, a micropuncture sheath was placed and fistulagram obtained which demonstrated the above findings.  The micropuncture sheath was removed and a 4-0 Monocryl figure-of-eight suture was placed for hemostasis  Contrast: 20 cc Sedation: None  Impression: Widely patent AV fistula, no flow-limiting stenosis   Norman GORMAN Serve MD [facility] [place] Office: [phone]

## 2024-09-14 NOTE — Assessment & Plan Note (Signed)
Loud snoring.

## 2024-09-14 NOTE — Progress Notes (Unsigned)
 New Patient Pulmonology Office Visit   Subjective:  Patient ID: Sherri Dixon, female    DOB: [birth year]  MRN: [id number]  Referred by: Macel Jayson PARAS, MD  CC: No chief complaint on file.  Discussed the use of AI scribe software for clinical note transcription with the patient, who gave verbal consent to proceed.  History of Present Illness Sherri Dixon is a 43 year old female with history of ESRD on HD, DM, prior PE, asthma who presents with shortness of breath and wheezing after dialysis treatments.  She has had persistent shortness of breath after dialysis for over a year that does not improve with fluid removal. She cannot walk long distances and uses a wheelchair outside the home. She can walk short distances slowly but has significant breathlessness with household tasks.  She had asthma diagnosed in childhood with remission since age 1 yo until the past year. Over the last year she has had increased wheezing, especially at night, with nocturnal cough and recent 2-week cough with deep white mucus. She notes more wheezing in cold weather. She has not used asthma medication since age14 and is currently uncontrolled.  She is taking prednisone  20 mg, for a recent allergic reaction to a dialyzer, which caused a rash on her back, arms, and legs. She has significant secondhand smoke exposure from her father and husband but has never smoked herself.  She is on disability and in school. She finds the intensive dialysis schedule difficult, which may limit her availability for pulmonary care and testing.  ROS as above/  Allergies: Amlodipine and Humalog  kwikpen [insulin  lispro] Current Medications[1] Past Medical History:  Diagnosis Date   Anemia    Asthma    as a child   CHF (congestive heart failure) (HCC)    Pt reports that father has CHF, not her   Chronic kidney disease    Diabetes mellitus without complication (HCC)    Type 2   Dyslipidemia    GERD (gastroesophageal reflux  disease)    Hypertension    Pulmonary embolism (HCC)    Stroke [facility])    Past Surgical History:  Procedure Laterality Date   A/V FISTULAGRAM N/A 06/01/2024   Procedure: A/V Fistulagram;  Surgeon: Pearline Norman RAMAN, MD;  Location: [facility];  Service: Cardiovascular;  Laterality: N/A;   A/V FISTULAGRAM Right 09/07/2024   Procedure: A/V Fistulagram;  Surgeon: Pearline Norman RAMAN, MD;  Location: [facility];  Service: Cardiovascular;  Laterality: Right;   A/V SHUNT INTERVENTION Right 03/28/2024   Procedure: A/V SHUNT INTERVENTION;  Surgeon: Magda Debby SAILOR, MD;  Location: [facility];  Service: Cardiovascular;  Laterality: Right;   APPLICATION OF WOUND VAC Right 04/12/2021   Procedure: APPLICATION OF WOUND VAC;  Surgeon: Elsa Lonni SAUNDERS, MD;  Location: [facility] OR;  Service: Orthopedics;  Laterality: Right;   AV FISTULA PLACEMENT Right 01/20/2024   Procedure: RIGHT ARM ARTERIOVENOUS (AV) BRACHIOCEPHALIC FISTULA CREATION;  Surgeon: Pearline Norman RAMAN, MD;  Location: [facility] OR;  Service: Vascular;  Laterality: Right;   DIALYSIS/PERMA CATHETER INSERTION N/A 10/02/2023   Procedure: DIALYSIS/PERMA CATHETER INSERTION;  Surgeon: Pearline Norman RAMAN, MD;  Location: [facility] INVASIVE CV LAB;  Service: Cardiovascular;  Laterality: N/A;   DIALYSIS/PERMA CATHETER INSERTION N/A 02/29/2024   Procedure: DIALYSIS/PERMA CATHETER INSERTION;  Surgeon: Pearline Norman RAMAN, MD;  Location: [facility] INVASIVE CV LAB;  Service: Cardiovascular;  Laterality: N/A;   I & D EXTREMITY Right 04/06/2021   Procedure: IRRIGATION AND DEBRIDEMENT TOE WITH WOUND VAC PLACEMENT;  Surgeon: Elsa Lonni SAUNDERS, MD;  Location: [facility] OR;  Service: Orthopedics;  Laterality: Right;   I & D EXTREMITY Right 04/12/2021   Procedure: IRRIGATION AND DEBRIDEMENT RIGHT FOOT;  Surgeon: Elsa Lonni SAUNDERS, MD;  Location: [facility] OR;  Service: Orthopedics;  Laterality: Right;   INCISION AND DRAINAGE OF DEEP ABSCESS, KNEE Right 08/04/2024   Procedure: INCISION AND DRAINAGE OF DEEP ABSCESS, KNEE;   Surgeon: Elsa Lonni SAUNDERS, MD;  Location: [facility] OR;  Service: Orthopedics;  Laterality: Right;   INCISION AND DRAINAGE OF WOUND Right 05/03/2024   Procedure: RIGHT FOOT INCISION AND DRAINAGE OF DEEP FOOT ABSCESS DEBRIDEMENT OF CHRONIC ULCERATION, ADJACENT SOFT TISSUE TRANSFER;  Surgeon: Elsa Lonni SAUNDERS, MD;  Location: [facility] ORS;  Service: Orthopedics;  Laterality: Right;   IR FLUORO GUIDE CV LINE RIGHT  10/01/2023   IR FLUORO GUIDE CV LINE RIGHT  10/01/2023   IR FLUORO GUIDE CV LINE RIGHT  10/02/2023   IR US  GUIDE VASC ACCESS RIGHT  10/01/2023   LIGATION OF COMPETING BRANCHES OF ARTERIOVENOUS FISTULA Right 06/14/2024   Procedure: LIGATION OF COMPETING BRANCHES OF ARTERIOVENOUS FISTULA;  Surgeon: Pearline Norman RAMAN, MD;  Location: [facility] OR;  Service: Vascular;  Laterality: Right;   MOUTH SURGERY     ROBOTIC ASSITED PARTIAL NEPHRECTOMY Right 07/28/2019   Procedure: XI ROBOTIC ASSITED PARTIAL NEPHRECTOMY;  Surgeon: Sherrilee Belvie CROME, MD;  Location: [facility] ORS;  Service: Urology;  Laterality: Right;  3 HRS   SECONDARY CLOSURE OF WOUND Right 06/18/2021   Procedure: SECONDARY CLOSURE OF WOUND tRANSMETATARSAL AMPUTAION;  Surgeon: Elsa Lonni SAUNDERS, MD;  Location: [facility] OR;  Service: Orthopedics;  Laterality: Right;   TEMPORARY DIALYSIS CATHETER N/A 02/29/2024   Procedure: TEMPORARY DIALYSIS CATHETER;  Surgeon: Pearline Norman RAMAN, MD;  Location: [facility] INVASIVE CV LAB;  Service: Cardiovascular;  Laterality: N/A;   TRANSMETATARSAL AMPUTATION Right 04/12/2021   Procedure: TRANSMETATARSAL AMPUTATION;  Surgeon: Elsa Lonni SAUNDERS, MD;  Location: [facility] OR;  Service: Orthopedics;  Laterality: Right;   TUNNELLED CATHETER EXCHANGE N/A 01/05/2024   Procedure: TUNNELLED CATHETER EXCHANGE;  Surgeon: Tobie Gordy POUR, MD;  Location: [facility] INVASIVE CV LAB;  Service: Cardiovascular;  Laterality: N/A;   TUNNELLED CATHETER EXCHANGE N/A 06/01/2024   Procedure: TUNNELLED CATHETER EXCHANGE;  Surgeon: Pearline Norman RAMAN, MD;  Location: [facility];  Service:  Cardiovascular;  Laterality: N/A;   TUNNELLED CATHETER EXCHANGE N/A 07/20/2024   Procedure: TUNNELLED CATHETER EXCHANGE;  Surgeon: Magda Debby SAILOR, MD;  Location: [facility];  Service: Cardiovascular;  Laterality: N/A;   VENOUS ANGIOPLASTY  03/28/2024   Procedure: VENOUS ANGIOPLASTY;  Surgeon: Magda Debby SAILOR, MD;  Location: [facility];  Service: Cardiovascular;;  Cephalic Vein   Family History  Problem Relation Age of Onset   High blood pressure Mother    Diabetes Mother    Arthritis Mother    Hyperlipidemia Mother    Heart disease Mother    High blood pressure Father    Hyperlipidemia Father    Dementia Father    High blood pressure Sister    Diabetes Brother    Hyperlipidemia Brother    High blood pressure Brother    Colon cancer Neg Hx    Stomach cancer Neg Hx    Esophageal cancer Neg Hx    Pancreatic cancer Neg Hx    Liver disease Neg Hx    Social History   Socioeconomic History   Marital status: Married    Spouse name: Not on file   Number of children: Not  on file   Years of education: Not on file   Highest education level: Not on file  Occupational History   Not on file  Tobacco Use   Smoking status: Never   Smokeless tobacco: Never  Vaping Use   Vaping status: Never Used  Substance and Sexual Activity   Alcohol  use: Not Currently    Alcohol /week: 1.0 standard drink of alcohol     Types: 1 Glasses of wine per week    Comment: occas   Drug use: No   Sexual activity: Yes    Birth control/protection: None  Other Topics Concern   Not on file  Social History Narrative   Not on file   Social Drivers of Health   Tobacco Use: Low Risk (09/13/2024)   Received from [facility]   Patient History    Smoking Tobacco Use: Never    Smokeless Tobacco Use: Never    Passive Exposure: Never  Financial Resource Strain: Low Risk (10/28/2023)   Received from [facility]   Overall Financial Resource Strain (CARDIA)    Difficulty of Paying Living Expenses: Not hard  at all  Food Insecurity: No Food Insecurity (10/28/2023)   Received from [facility]   Epic    Within the past 12 months, you worried that your food would run out before you got the money to buy more.: Never true    Within the past 12 months, the food you bought just didn't last and you didn't have money to get more.: Never true  Transportation Needs: No Transportation Needs (10/28/2023)   Received from [facility] - Transportation    Lack of Transportation (Medical): No    Lack of Transportation (Non-Medical): No  Physical Activity: Unknown (10/28/2023)   Received from [facility]   Exercise Vital Sign    On average, how many days per week do you engage in moderate to strenuous exercise (like a brisk walk)?: 0 days    Minutes of Exercise per Session: Not on file  Stress: Stress Concern Present (10/28/2023)   Received from [facility] - Occupational Stress Questionnaire    Feeling of Stress : To some extent  Social Connections: Socially Integrated (10/28/2023)   Received from [facility]   Social Network    How would you rate your social network (family, work, friends)?: Good participation with social networks  Intimate Partner Violence: Not At Risk (10/28/2023)   Received from [facility]   HITS    Over the last 12 months how often did your partner physically hurt you?: Never    Over the last 12 months how often did your partner insult you or talk down to you?: Never    Over the last 12 months how often did your partner threaten you with physical harm?: Never    Over the last 12 months how often did your partner scream or curse at you?: Never  Depression (PHQ2-9): Low Risk (11/13/2022)   Depression (PHQ2-9)    PHQ-2 Score: 0  Alcohol  Screen: Not on file  Housing: Low Risk (10/28/2023)   Received from [facility]    In the last 12 months, was there a time when you were not able to pay the mortgage or rent on  time?: No    In the past 12 months, how many times have you moved where you were living?: 1    At any time in the past 12 months, were you  homeless or living in a shelter (including now)?: No  Utilities: Not At Risk (10/28/2023)   Received from [facility] Utilities    Threatened with loss of utilities: No  Health Literacy: Not on file       Objective:  There were no vitals taken for this visit. Wt Readings from Last 3 Encounters:  09/15/24 219 lb 9.6 oz (99.6 kg)  08/29/24 215 lb (97.5 kg)  08/04/24 213 lb (96.6 kg)   BMI Readings from Last 3 Encounters:  09/15/24 36.54 kg/m  08/29/24 35.78 kg/m  08/04/24 35.45 kg/m   SpO2 Readings from Last 3 Encounters:  09/15/24 99%  09/07/24 93%  08/29/24 100%    Physical Exam Constitutional:      Appearance: Normal appearance.  HENT:     Head: Normocephalic.  Eyes:     Extraocular Movements: Extraocular movements intact.     Pupils: Pupils are equal, round, and reactive to light.  Cardiovascular:     Rate and Rhythm: Normal rate and regular rhythm.  Pulmonary:     Effort: Pulmonary effort is normal.     Breath sounds: Normal breath sounds.  Musculoskeletal:        General: Normal range of motion.  Neurological:     General: No focal deficit present.     Mental Status: She is alert and oriented to person, place, and time.    Diagnostic Review:     Echo 10/06/23 EF 60-65%. LV has normal fx. No WMA. LV hypertrophy. RV systolic fx is normal.  LA was dilated. Pericardial effusion without tamponade.  Assessment & Plan:   Assessment & Plan Moderate persistent asthma Patient with history of childhood asthma, present with increased wheezing, nocturnal cough, and dyspnea. Symptoms suggest bronchospasm and airway inflammation. No recent hospitalizations. Denied smoking history. She does not seem to be in acute flare today.   - Prescribed Wixela inhaler, one puff twice daily. - Educated on inhaler use with spacer and  mouth rinsing post-use to prevent thrush. - Prescribed Tessalon  Perles for cough, three times daily as needed for up to five days. - Continue prednisone  20 mg for allergic reaction and cough management. - Schedule pulmonary function test in three months. - Provided asthma education materials and inhaler usage instructions. -I provided inhalers sample as Breztri  for 2 months.  - Follow up in three months.  Return in about 3 months (around 12/14/2024).   I spent 40 minutes in this encounter and 10 minutes reviewing the chart.   Marny Patch, MD Pulmonary and Critical Care Medicine [facility] Pulmonary Care     [1]  Current Outpatient Medications:    amoxicillin -clavulanate (AUGMENTIN ) 875-125 MG tablet, Take 1 tablet by mouth 2 (two) times daily., Disp: 14 tablet, Rfl: 0   apixaban  (ELIQUIS ) 5 MG TABS tablet, Take 1 tablet (5 mg total) by mouth 2 (two) times daily., Disp: , Rfl:    Blood Glucose Monitoring Suppl (BLOOD GLUCOSE MONITOR SYSTEM) w/Device KIT, Use as directed in the morning, at noon, and at bedtime., Disp: 1 kit, Rfl: 0   Continuous Blood Gluc Sensor (DEXCOM G6 SENSOR) MISC, by Does not apply route., Disp: , Rfl:    doxycycline  (VIBRA -TABS) 100 MG tablet, Take 1 tablet (100 mg total) by mouth 2 (two) times daily., Disp: 14 tablet, Rfl: 0   doxycycline  (VIBRAMYCIN ) 100 MG capsule, Take 1 capsule (100 mg total) by mouth 2 (two) times daily., Disp: 28 capsule, Rfl: 0   gabapentin  (NEURONTIN ) 100  MG capsule, Take 100 mg by mouth 2 (two) times daily as needed (nerve pain)., Disp: , Rfl:    hydrALAZINE  (APRESOLINE ) 50 MG tablet, Take 50 mg by mouth in the morning and at bedtime., Disp: , Rfl:    insulin  aspart (NOVOLOG ) 100 UNIT/ML FlexPen, Use as directed before each meal 3 times a day, 140-199 - 2 units, 200-250 - 4 units, 251-299 - 6 units,  300-349 - 8 units,  350 or above 10 units. (Patient taking differently: Inject 8-16 Units into the skin 3 (three) times daily with  meals. Sliding scale), Disp: 3 mL, Rfl: 0   Insulin  Pen Needle 32G X 4 MM MISC, Use as directed 4 times a day for subcutaneous insulin  injection., Disp: 100 each, Rfl: 0   nitroGLYCERIN  (NITROSTAT ) 0.4 MG SL tablet, Take 0.4 mg by mouth every 5 (five) minutes as needed for chest pain., Disp: , Rfl:    omeprazole  (PRILOSEC) 20 MG capsule, TAKE 1 CAPSULE (20 MG TOTAL) BY MOUTH 2 (TWO) TIMES DAILY BEFORE A MEAL. (Patient taking differently: Take 20 mg by mouth daily as needed (acid reflux or indigestion).), Disp: 180 capsule, Rfl: 1   oxyCODONE  (ROXICODONE ) 5 MG immediate release tablet, Take 1 tablet (5 mg total) by mouth every 4 (four) hours as needed for severe pain (pain score 7-10)., Disp: 15 tablet, Rfl: 0   rosuvastatin  (CRESTOR ) 10 MG tablet, Take 1 tablet (10 mg total) by mouth daily., Disp: 30 tablet, Rfl: 11   sevelamer (RENAGEL) 800 MG tablet, Take 800 mg by mouth 3 (three) times daily with meals., Disp: , Rfl:    TRESIBA  FLEXTOUCH 100 UNIT/ML FlexTouch Pen, Inject 36 Units into the skin at bedtime., Disp: , Rfl:

## 2024-09-15 ENCOUNTER — Ambulatory Visit

## 2024-09-15 VITALS — BP 131/90 | HR 96 | Temp 98.3°F | Resp 18 | Ht 65.0 in | Wt 219.6 lb

## 2024-09-15 DIAGNOSIS — J4540 Moderate persistent asthma, uncomplicated: Secondary | ICD-10-CM

## 2024-09-15 DIAGNOSIS — R0683 Snoring: Secondary | ICD-10-CM

## 2024-09-15 DIAGNOSIS — N186 End stage renal disease: Secondary | ICD-10-CM | POA: Diagnosis not present

## 2024-09-15 DIAGNOSIS — R0602 Shortness of breath: Secondary | ICD-10-CM

## 2024-09-15 DIAGNOSIS — Z992 Dependence on renal dialysis: Secondary | ICD-10-CM | POA: Diagnosis not present

## 2024-09-15 MED ORDER — FLUTICASONE-SALMETEROL 500-50 MCG/ACT IN AEPB: 1.0000 | INHALATION_SPRAY | Freq: Two times a day (BID) | RESPIRATORY_TRACT | 6 refills | Status: AC

## 2024-09-15 MED ORDER — ALBUTEROL SULFATE HFA 108 (90 BASE) MCG/ACT IN AERS: 1.0000 | INHALATION_SPRAY | Freq: Four times a day (QID) | RESPIRATORY_TRACT | 6 refills | Status: AC | PRN

## 2024-09-15 MED ORDER — BENZONATATE 200 MG PO CAPS: 200.0000 mg | ORAL_CAPSULE | Freq: Three times a day (TID) | ORAL | 0 refills | Status: AC | PRN

## 2024-09-15 MED ORDER — BREZTRI AEROSPHERE 160-9-4.8 MCG/ACT IN AERO: 2.0000 | INHALATION_SPRAY | Freq: Two times a day (BID) | RESPIRATORY_TRACT | Status: AC

## 2024-09-15 MED ORDER — AEROCHAMBER MV MISC: 0 refills | Status: AC

## 2024-09-15 NOTE — Patient Instructions (Addendum)
 Dear Ms. Abran;  I think your symptoms are related to asthma given your history of wheezing and cough.   I will recommend the following: -Wixela 1 puff twice a day. Rinse your mouth after each use.  -Albuterol  1-2 puffs as need it for shortness of breath every 4-6 hours.  -Tessalon  1 tablet 3 times a day as need for cough. -Use the spacer with each puff. -I will do a pulmonary function test in 3 months   I will see you in 3 months.  INHALER INSTRUCTIONS: To use the inhaler you follow these steps: Prime the inhaler according to instructions (which means waste between 1-4 doses before next use).  Follow package insert Shake the inhaler before each use Exhale completely by blowing all the air out of your lungs Press down on the canister then inhale SLOW and STEADY until your fill your lungs completely. Hold the breath for 6-10 seconds Gently exhale Wait 60 seconds then repeat steps 2-8. Rinse the mouth after each use to prevent thrush.   Your pharmacist can also review proper technique if you have any remaining questions. Let me know if the cost is too high, your insurance may be able to recommend a more affordable option for you.

## 2024-09-26 ENCOUNTER — Other Ambulatory Visit: Payer: Self-pay | Admitting: *Deleted

## 2024-09-26 DIAGNOSIS — N186 End stage renal disease: Secondary | ICD-10-CM

## 2024-10-03 ENCOUNTER — Encounter: Payer: Self-pay | Admitting: Physician Assistant

## 2024-10-03 ENCOUNTER — Ambulatory Visit (HOSPITAL_COMMUNITY)
Admission: RE | Admit: 2024-10-03 | Discharge: 2024-10-03 | Disposition: A | Discharge status: Home / Self Care | Source: Ambulatory Visit | Attending: Surgery | Admitting: Surgery

## 2024-10-03 ENCOUNTER — Ambulatory Visit (INDEPENDENT_AMBULATORY_CARE_PROVIDER_SITE_OTHER): Admitting: Physician Assistant

## 2024-10-03 VITALS — BP 189/104 | HR 85 | Temp 97.1°F | Ht 65.0 in | Wt 215.0 lb

## 2024-10-03 DIAGNOSIS — N186 End stage renal disease: Secondary | ICD-10-CM

## 2024-10-03 NOTE — Progress Notes (Signed)
 " POST OPERATIVE OFFICE NOTE    CC:  F/u for surgery  HPI:  This is a 44 y.o. female who presents for evaluation of her AVF.   Access hx: -right [facility] 10/02/2023 -right BC AVF 01/20/2024 -fistulogram with peripheral angioplasty 03/28/2024 -fistulogram (widely patent) with large branch 06/01/2024 -branch ligation right AVF 06/14/2024  -venogram/exchange TDC  -fistulogram/venogram - widely patent central venous system and AVF with no flow limiting stenosis 09/07/2024  Pt comes in today with her husband for evaluation.    Pt states she does not have pain/numbness in the right hand.  She states that her fistula is working well and is not having any issues with dialysis.  She still has her TDC.  She states that she did have an allergic reaction to the regular dialyzer machine and now requires a special dialyzer.  In November, she was placed back on the regular dialyzer and broke out into a severe rash over her entire body and is still present.  She states she was sent here for concern of a cluster of veins near the fistula.    The pt is on dialysis is at M/W/F at [facility] [address] location.   Allergies[1]  Current Outpatient Medications  Medication Sig Dispense Refill   albuterol  (VENTOLIN  HFA) 108 (90 Base) MCG/ACT inhaler Inhale 1-2 puffs into the lungs every 6 (six) hours as needed for wheezing or shortness of breath. 8 g 6   amoxicillin -clavulanate (AUGMENTIN ) 875-125 MG tablet Take 1 tablet by mouth 2 (two) times daily. 14 tablet 0   apixaban  (ELIQUIS ) 5 MG TABS tablet Take 1 tablet (5 mg total) by mouth 2 (two) times daily.     Blood Glucose Monitoring Suppl (BLOOD GLUCOSE MONITOR SYSTEM) w/Device KIT Use as directed in the morning, at noon, and at bedtime. 1 kit 0   budesonide-glycopyrrolate -formoterol (BREZTRI  AEROSPHERE) 160-9-4.8 MCG/ACT AERO inhaler Inhale 2 puffs into the lungs 2 (two) times daily.     Continuous Blood Gluc Sensor (DEXCOM G6 SENSOR) MISC by Does not apply route.      doxycycline  (VIBRA -TABS) 100 MG tablet Take 1 tablet (100 mg total) by mouth 2 (two) times daily. 14 tablet 0   doxycycline  (VIBRAMYCIN ) 100 MG capsule Take 1 capsule (100 mg total) by mouth 2 (two) times daily. 28 capsule 0   fluticasone -salmeterol (WIXELA INHUB) 500-50 MCG/ACT AEPB Inhale 1 puff into the lungs in the morning and at bedtime. 60 each 6   gabapentin  (NEURONTIN ) 100 MG capsule Take 100 mg by mouth 2 (two) times daily as needed (nerve pain).     hydrALAZINE  (APRESOLINE ) 50 MG tablet Take 50 mg by mouth in the morning and at bedtime.     insulin  aspart (NOVOLOG ) 100 UNIT/ML FlexPen Use as directed before each meal 3 times a day, 140-199 - 2 units, 200-250 - 4 units, 251-299 - 6 units,  300-349 - 8 units,  350 or above 10 units. (Patient taking differently: Inject 8-16 Units into the skin 3 (three) times daily with meals. Sliding scale) 3 mL 0   Insulin  Pen Needle 32G X 4 MM MISC Use as directed 4 times a day for subcutaneous insulin  injection. 100 each 0   nitroGLYCERIN  (NITROSTAT ) 0.4 MG SL tablet Take 0.4 mg by mouth every 5 (five) minutes as needed for chest pain.     omeprazole  (PRILOSEC) 20 MG capsule TAKE 1 CAPSULE (20 MG TOTAL) BY MOUTH 2 (TWO) TIMES DAILY BEFORE A MEAL. (Patient taking differently: Take 20 mg by mouth  daily as needed (acid reflux or indigestion).) 180 capsule 1   oxyCODONE  (ROXICODONE ) 5 MG immediate release tablet Take 1 tablet (5 mg total) by mouth every 4 (four) hours as needed for severe pain (pain score 7-10). 15 tablet 0   rosuvastatin  (CRESTOR ) 10 MG tablet Take 1 tablet (10 mg total) by mouth daily. 30 tablet 11   sevelamer (RENAGEL) 800 MG tablet Take 800 mg by mouth 3 (three) times daily with meals.     Spacer/Aero-Holding Chambers (AEROCHAMBER MV) inhaler Use as instructed 1 each 0   TRESIBA  FLEXTOUCH 100 UNIT/ML FlexTouch Pen Inject 36 Units into the skin at bedtime.     No current facility-administered medications for this visit.     ROS:  See  HPI  Physical Exam:  Today's Vitals   10/03/24 0925  BP: (!) 189/104  Pulse: 85  Temp: (!) 97.1 F (36.2 C)  TempSrc: Temporal  Weight: 215 lb (97.5 kg)  Height: 5' 5 (1.651 m)   Body mass index is 35.78 kg/m.   Incision:  well healed Extremities:   There is a palpable right radial pulse.   Motor and sensory are in tact.   There is a thrill present.  Access is  easily palpable   Dialysis Duplex on 10/03/2024: Findings:  +--------------------+----------+-----------------+--------+  AVF                PSV (cm/s)Flow Vol (mL/min)Comments  +--------------------+----------+-----------------+--------+  Native artery inflow   233           968                 +--------------------+----------+-----------------+--------+  AVF Anastomosis        488                               +--------------------+----------+-----------------+--------+     +------------+----------+------------+----------+--------------------------  OUTFLOW VEINPSV (cm/s)  Diameter  Depth (cm)        +------------+----------+------------+----------+--------------------------  Shoulder      506        0.64       0.45        Retained valve and vs  stranding        +------------+----------+------------+----------+--------------------------  Prox UA         70        0.77       0.61     competing branch and 0.44 cm   +------------+----------+------------+----------+--------------------------  Mid UA          93        1.02       0.61     competing branch and 0.27 cm   +------------+----------+------------+----------+--------------------------  Dist UA        860        0.57       0.61                              +------------+----------+------------+----------+--------------------------  AC Fossa       309        0.77       0.29                              +------------+----------+------------+----------+--------------------------    Assessment/Plan:  This is  a 44 y.o. female with ESRD with following hx:  -right CFV TDC  10/02/2023 -right BC AVF 01/20/2024 -fistulogram with peripheral angioplasty 03/28/2024 -fistulogram (widely patent) with large branch 06/01/2024 -branch ligation right AVF 06/14/2024  -venogram/exchange TDC  -fistulogram/venogram - widely patent central venous system and AVF with no flow limiting stenosis 09/07/2024  -the pt does not have evidence of steal. -pt has excellent thrill and is not pulsatile in her fistula and she states it is working well without any issues.   Duplex revealed elevated velocities.  She had a fistulogram 3 weeks ago that revealed widely patent fistula with no flow limiting stenosis.   -The dialysis center was concerned about a cluster of veins.  She does have some competing branches in the upper arm.   If this becomes an issue at a later date, I can be addressed.  Currently, given fistula is working well, do not recommend any intervention at this time.   -if pt has tunneled catheter, this can be removed at the discretion of the dialysis center once the pt's access has been successfully cannulated to their satisfaction.  -discussed with pt that access does not last forever and will need intervention or even new access at some point.  She and her husband expressed good understanding.  -the pt will follow up as needed.    Lucie Apt, [facility] [phone]  Clinic MD:  Serene     [1]  Allergies Allergen Reactions   Amlodipine Swelling    Pelvis swells   Humalog  Kwikpen [Insulin  Lispro] Other (See Comments)    75/50---swelling and ulcers in mouth    "

## 2024-10-20 ENCOUNTER — Ambulatory Visit: Payer: Self-pay | Admitting: Allergy & Immunology

## 2024-10-21 NOTE — Progress Notes (Signed)
 [address] [place]   [address] [place] [address]   [phone]     Type 2 diabetes mellitus with diabetic chronic kidney disease    (CMD) No data to display  Chronic Start Date:  10/03/2023   Maintenance Dialysis History      Start End Type Center Comments   10/03/2023  [facility]-center Hemodialysis [facility]  MWF         Current Dialysis Center Information     [facility]     Phone: [phone] Fax:    Address: [address] [place] [address]                 Orders Placed This Encounter  Procedures   Ambulatory referral to Solid Organ Transplant Team

## 2024-11-30 ENCOUNTER — Ambulatory Visit: Admitting: Allergy

## 2024-12-20 ENCOUNTER — Ambulatory Visit

## 2024-12-20 ENCOUNTER — Encounter
